# Patient Record
Sex: Female | Born: 1965 | Race: White | Hispanic: No | Marital: Married | Smoking: Never smoker
Health system: Southern US, Community
[De-identification: ages and names within clinical notes are randomized; demographics above are authoritative.]

## PROBLEM LIST (undated history)

## (undated) DIAGNOSIS — G9050 Complex regional pain syndrome I, unspecified: Secondary | ICD-10-CM

## (undated) DIAGNOSIS — M1990 Unspecified osteoarthritis, unspecified site: Secondary | ICD-10-CM

## (undated) DIAGNOSIS — R519 Headache, unspecified: Secondary | ICD-10-CM

## (undated) DIAGNOSIS — F419 Anxiety disorder, unspecified: Secondary | ICD-10-CM

## (undated) DIAGNOSIS — I2510 Atherosclerotic heart disease of native coronary artery without angina pectoris: Secondary | ICD-10-CM

## (undated) DIAGNOSIS — E119 Type 2 diabetes mellitus without complications: Secondary | ICD-10-CM

## (undated) DIAGNOSIS — G43909 Migraine, unspecified, not intractable, without status migrainosus: Secondary | ICD-10-CM

## (undated) DIAGNOSIS — G4730 Sleep apnea, unspecified: Secondary | ICD-10-CM

## (undated) DIAGNOSIS — I10 Essential (primary) hypertension: Secondary | ICD-10-CM

## (undated) DIAGNOSIS — M797 Fibromyalgia: Secondary | ICD-10-CM

## (undated) DIAGNOSIS — Z860100 Personal history of colon polyps, unspecified: Secondary | ICD-10-CM

## (undated) DIAGNOSIS — E66811 Obesity, class 1: Secondary | ICD-10-CM

## (undated) DIAGNOSIS — R51 Headache: Secondary | ICD-10-CM

## (undated) DIAGNOSIS — T7840XA Allergy, unspecified, initial encounter: Secondary | ICD-10-CM

## (undated) DIAGNOSIS — K900 Celiac disease: Secondary | ICD-10-CM

## (undated) DIAGNOSIS — K219 Gastro-esophageal reflux disease without esophagitis: Secondary | ICD-10-CM

## (undated) DIAGNOSIS — E669 Obesity, unspecified: Secondary | ICD-10-CM

## (undated) DIAGNOSIS — G459 Transient cerebral ischemic attack, unspecified: Secondary | ICD-10-CM

## (undated) DIAGNOSIS — E785 Hyperlipidemia, unspecified: Secondary | ICD-10-CM

## (undated) HISTORY — DX: Gastro-esophageal reflux disease without esophagitis: K21.9

## (undated) HISTORY — DX: Personal history of colonic polyps: Z86.010

## (undated) HISTORY — DX: Migraine, unspecified, not intractable, without status migrainosus: G43.909

## (undated) HISTORY — DX: Personal history of colon polyps, unspecified: Z86.0100

## (undated) HISTORY — PX: DIAGNOSTIC LAPAROSCOPY: SUR761

## (undated) HISTORY — DX: Sleep apnea, unspecified: G47.30

## (undated) HISTORY — PX: NASAL SEPTUM SURGERY: SHX37

## (undated) HISTORY — DX: Obesity, class 1: E66.811

## (undated) HISTORY — PX: BLADDER REPAIR: SHX76

## (undated) HISTORY — DX: Essential (primary) hypertension: I10

## (undated) HISTORY — PX: HERNIA REPAIR: SHX51

## (undated) HISTORY — DX: Atherosclerotic heart disease of native coronary artery without angina pectoris: I25.10

## (undated) HISTORY — DX: Celiac disease: K90.0

## (undated) HISTORY — PX: BREAST BIOPSY: SHX20

## (undated) HISTORY — PX: APPENDECTOMY: SHX54

## (undated) HISTORY — PX: BLADDER SUSPENSION: SHX72

## (undated) HISTORY — DX: Anxiety disorder, unspecified: F41.9

## (undated) HISTORY — DX: Hyperlipidemia, unspecified: E78.5

## (undated) HISTORY — DX: Obesity, unspecified: E66.9

## (undated) HISTORY — DX: Allergy, unspecified, initial encounter: T78.40XA

## (undated) HISTORY — DX: Type 2 diabetes mellitus without complications: E11.9

## (undated) HISTORY — PX: TONSILLECTOMY: SUR1361

## (undated) HISTORY — PX: VAGINAL HYSTERECTOMY: SUR661

---

## 2001-06-01 ENCOUNTER — Encounter: Payer: Self-pay | Admitting: Internal Medicine

## 2005-03-22 ENCOUNTER — Ambulatory Visit: Payer: Self-pay | Admitting: Internal Medicine

## 2005-04-01 ENCOUNTER — Encounter
Admission: RE | Admit: 2005-04-01 | Discharge: 2005-06-30 | Payer: Self-pay | Admitting: Physical Medicine & Rehabilitation

## 2005-04-02 ENCOUNTER — Ambulatory Visit: Payer: Self-pay | Admitting: Physical Medicine & Rehabilitation

## 2005-06-02 ENCOUNTER — Ambulatory Visit: Payer: Self-pay | Admitting: Physical Medicine & Rehabilitation

## 2005-07-02 ENCOUNTER — Encounter
Admission: RE | Admit: 2005-07-02 | Discharge: 2005-09-30 | Payer: Self-pay | Admitting: Physical Medicine & Rehabilitation

## 2005-07-28 ENCOUNTER — Ambulatory Visit: Payer: Self-pay | Admitting: Physical Medicine & Rehabilitation

## 2005-09-20 ENCOUNTER — Ambulatory Visit: Payer: Self-pay | Admitting: Physical Medicine & Rehabilitation

## 2005-10-20 ENCOUNTER — Encounter
Admission: RE | Admit: 2005-10-20 | Discharge: 2006-01-18 | Payer: Self-pay | Admitting: Physical Medicine & Rehabilitation

## 2005-12-01 ENCOUNTER — Ambulatory Visit: Payer: Self-pay | Admitting: Physical Medicine & Rehabilitation

## 2005-12-27 ENCOUNTER — Ambulatory Visit: Payer: Self-pay | Admitting: Physical Medicine & Rehabilitation

## 2006-01-26 ENCOUNTER — Ambulatory Visit: Payer: Self-pay | Admitting: Physical Medicine & Rehabilitation

## 2006-01-26 ENCOUNTER — Encounter
Admission: RE | Admit: 2006-01-26 | Discharge: 2006-04-26 | Payer: Self-pay | Admitting: Physical Medicine & Rehabilitation

## 2006-02-08 ENCOUNTER — Ambulatory Visit: Payer: Self-pay | Admitting: Internal Medicine

## 2006-03-03 ENCOUNTER — Ambulatory Visit: Payer: Self-pay | Admitting: Internal Medicine

## 2006-03-21 ENCOUNTER — Ambulatory Visit: Payer: Self-pay | Admitting: Physical Medicine & Rehabilitation

## 2006-03-24 ENCOUNTER — Encounter: Payer: Self-pay | Admitting: Obstetrics & Gynecology

## 2006-03-24 ENCOUNTER — Ambulatory Visit: Payer: Self-pay | Admitting: Obstetrics & Gynecology

## 2006-03-28 ENCOUNTER — Ambulatory Visit (HOSPITAL_COMMUNITY): Admission: RE | Admit: 2006-03-28 | Discharge: 2006-03-28 | Payer: Self-pay | Admitting: Gynecology

## 2006-05-02 ENCOUNTER — Ambulatory Visit: Payer: Self-pay | Admitting: Oncology

## 2006-05-17 ENCOUNTER — Ambulatory Visit: Payer: Self-pay | Admitting: Physical Medicine & Rehabilitation

## 2006-05-17 ENCOUNTER — Encounter
Admission: RE | Admit: 2006-05-17 | Discharge: 2006-08-15 | Payer: Self-pay | Admitting: Physical Medicine & Rehabilitation

## 2006-05-18 ENCOUNTER — Encounter
Admission: RE | Admit: 2006-05-18 | Discharge: 2006-08-16 | Payer: Self-pay | Admitting: Physical Medicine & Rehabilitation

## 2006-05-19 ENCOUNTER — Ambulatory Visit: Payer: Self-pay | Admitting: Physical Medicine & Rehabilitation

## 2006-06-09 ENCOUNTER — Ambulatory Visit: Payer: Self-pay | Admitting: Obstetrics & Gynecology

## 2006-06-28 ENCOUNTER — Ambulatory Visit (HOSPITAL_COMMUNITY): Admission: RE | Admit: 2006-06-28 | Discharge: 2006-06-30 | Payer: Self-pay | Admitting: Gynecology

## 2006-06-28 ENCOUNTER — Ambulatory Visit: Payer: Self-pay | Admitting: Obstetrics & Gynecology

## 2006-06-28 ENCOUNTER — Encounter (INDEPENDENT_AMBULATORY_CARE_PROVIDER_SITE_OTHER): Payer: Self-pay | Admitting: Specialist

## 2006-07-03 ENCOUNTER — Inpatient Hospital Stay (HOSPITAL_COMMUNITY): Admission: AD | Admit: 2006-07-03 | Discharge: 2006-07-03 | Payer: Self-pay | Admitting: Gynecology

## 2006-07-07 ENCOUNTER — Ambulatory Visit: Payer: Self-pay | Admitting: Obstetrics & Gynecology

## 2006-07-08 ENCOUNTER — Ambulatory Visit: Payer: Self-pay | Admitting: Gynecology

## 2006-07-11 ENCOUNTER — Ambulatory Visit: Payer: Self-pay | Admitting: Physical Medicine & Rehabilitation

## 2006-08-04 ENCOUNTER — Ambulatory Visit: Payer: Self-pay | Admitting: Family Medicine

## 2006-08-11 ENCOUNTER — Ambulatory Visit: Payer: Self-pay | Admitting: Obstetrics & Gynecology

## 2006-08-12 ENCOUNTER — Ambulatory Visit: Payer: Self-pay | Admitting: Physical Medicine & Rehabilitation

## 2006-09-05 ENCOUNTER — Ambulatory Visit: Payer: Self-pay | Admitting: Gynecology

## 2006-09-14 ENCOUNTER — Ambulatory Visit: Payer: Self-pay | Admitting: Physical Medicine & Rehabilitation

## 2006-09-14 ENCOUNTER — Encounter
Admission: RE | Admit: 2006-09-14 | Discharge: 2006-12-13 | Payer: Self-pay | Admitting: Physical Medicine & Rehabilitation

## 2006-11-17 ENCOUNTER — Ambulatory Visit: Payer: Self-pay | Admitting: Internal Medicine

## 2006-11-18 ENCOUNTER — Encounter
Admission: RE | Admit: 2006-11-18 | Discharge: 2007-02-16 | Payer: Self-pay | Admitting: Physical Medicine & Rehabilitation

## 2006-11-21 ENCOUNTER — Ambulatory Visit: Payer: Self-pay | Admitting: Physical Medicine & Rehabilitation

## 2006-11-28 ENCOUNTER — Encounter: Payer: Self-pay | Admitting: Internal Medicine

## 2006-11-28 ENCOUNTER — Ambulatory Visit: Payer: Self-pay

## 2006-12-19 ENCOUNTER — Encounter
Admission: RE | Admit: 2006-12-19 | Discharge: 2007-03-19 | Payer: Self-pay | Admitting: Physical Medicine and Rehabilitation

## 2006-12-19 ENCOUNTER — Ambulatory Visit: Payer: Self-pay | Admitting: Physical Medicine and Rehabilitation

## 2007-01-17 ENCOUNTER — Ambulatory Visit: Payer: Self-pay | Admitting: Physical Medicine & Rehabilitation

## 2007-02-10 ENCOUNTER — Encounter
Admission: RE | Admit: 2007-02-10 | Discharge: 2007-05-11 | Payer: Self-pay | Admitting: Physical Medicine & Rehabilitation

## 2007-02-17 ENCOUNTER — Telehealth (INDEPENDENT_AMBULATORY_CARE_PROVIDER_SITE_OTHER): Payer: Self-pay | Admitting: *Deleted

## 2007-03-14 ENCOUNTER — Ambulatory Visit: Payer: Self-pay | Admitting: Physical Medicine & Rehabilitation

## 2007-03-16 ENCOUNTER — Ambulatory Visit: Payer: Self-pay | Admitting: Obstetrics & Gynecology

## 2007-03-24 ENCOUNTER — Encounter: Payer: Self-pay | Admitting: Internal Medicine

## 2007-03-24 DIAGNOSIS — Z860100 Personal history of colon polyps, unspecified: Secondary | ICD-10-CM | POA: Insufficient documentation

## 2007-03-24 DIAGNOSIS — G4730 Sleep apnea, unspecified: Secondary | ICD-10-CM | POA: Insufficient documentation

## 2007-03-24 DIAGNOSIS — T7840XA Allergy, unspecified, initial encounter: Secondary | ICD-10-CM | POA: Insufficient documentation

## 2007-03-24 DIAGNOSIS — J4520 Mild intermittent asthma, uncomplicated: Secondary | ICD-10-CM | POA: Insufficient documentation

## 2007-03-24 DIAGNOSIS — F39 Unspecified mood [affective] disorder: Secondary | ICD-10-CM | POA: Insufficient documentation

## 2007-03-24 DIAGNOSIS — G90529 Complex regional pain syndrome I of unspecified lower limb: Secondary | ICD-10-CM | POA: Insufficient documentation

## 2007-03-24 DIAGNOSIS — K219 Gastro-esophageal reflux disease without esophagitis: Secondary | ICD-10-CM | POA: Insufficient documentation

## 2007-03-24 DIAGNOSIS — G43009 Migraine without aura, not intractable, without status migrainosus: Secondary | ICD-10-CM | POA: Insufficient documentation

## 2007-03-28 ENCOUNTER — Ambulatory Visit: Payer: Self-pay | Admitting: Internal Medicine

## 2007-03-28 DIAGNOSIS — R079 Chest pain, unspecified: Secondary | ICD-10-CM | POA: Insufficient documentation

## 2007-03-29 ENCOUNTER — Ambulatory Visit: Payer: Self-pay | Admitting: Obstetrics & Gynecology

## 2007-04-09 ENCOUNTER — Encounter: Payer: Self-pay | Admitting: Internal Medicine

## 2007-04-18 ENCOUNTER — Ambulatory Visit: Payer: Self-pay | Admitting: Internal Medicine

## 2007-05-11 ENCOUNTER — Ambulatory Visit: Payer: Self-pay | Admitting: Physical Medicine & Rehabilitation

## 2007-06-01 ENCOUNTER — Ambulatory Visit: Payer: Self-pay | Admitting: Obstetrics & Gynecology

## 2007-06-08 ENCOUNTER — Encounter
Admission: RE | Admit: 2007-06-08 | Discharge: 2007-07-18 | Payer: Self-pay | Admitting: Physical Medicine & Rehabilitation

## 2007-06-26 ENCOUNTER — Encounter: Payer: Self-pay | Admitting: Internal Medicine

## 2007-06-30 ENCOUNTER — Ambulatory Visit: Payer: Self-pay | Admitting: Internal Medicine

## 2007-06-30 DIAGNOSIS — J301 Allergic rhinitis due to pollen: Secondary | ICD-10-CM | POA: Insufficient documentation

## 2007-06-30 DIAGNOSIS — I10 Essential (primary) hypertension: Secondary | ICD-10-CM | POA: Insufficient documentation

## 2007-06-30 DIAGNOSIS — J309 Allergic rhinitis, unspecified: Secondary | ICD-10-CM | POA: Insufficient documentation

## 2007-07-03 ENCOUNTER — Encounter: Payer: Self-pay | Admitting: Internal Medicine

## 2007-07-03 LAB — CONVERTED CEMR LAB
Albumin: 3.9 g/dL (ref 3.5–5.2)
CO2: 26 meq/L (ref 19–32)
Calcium: 9.1 mg/dL (ref 8.4–10.5)
Creatinine,U: 127 mg/dL
GFR calc non Af Amer: 83 mL/min
Glucose, Bld: 92 mg/dL (ref 70–99)
Microalb, Ur: 1.1 mg/dL (ref 0.0–1.9)
Phosphorus: 2 mg/dL — ABNORMAL LOW (ref 2.3–4.6)
Potassium: 4 meq/L (ref 3.5–5.1)
Sodium: 144 meq/L (ref 135–145)

## 2007-07-04 ENCOUNTER — Encounter: Payer: Self-pay | Admitting: Obstetrics & Gynecology

## 2007-07-04 ENCOUNTER — Ambulatory Visit (HOSPITAL_COMMUNITY): Admission: RE | Admit: 2007-07-04 | Discharge: 2007-07-04 | Payer: Self-pay | Admitting: Obstetrics & Gynecology

## 2007-07-04 ENCOUNTER — Ambulatory Visit: Payer: Self-pay | Admitting: Obstetrics & Gynecology

## 2007-07-19 ENCOUNTER — Encounter
Admission: RE | Admit: 2007-07-19 | Discharge: 2007-10-17 | Payer: Self-pay | Admitting: Physical Medicine & Rehabilitation

## 2007-07-20 ENCOUNTER — Ambulatory Visit: Payer: Self-pay | Admitting: Physical Medicine & Rehabilitation

## 2007-07-27 ENCOUNTER — Ambulatory Visit: Payer: Self-pay | Admitting: Obstetrics & Gynecology

## 2007-09-04 ENCOUNTER — Encounter: Payer: Self-pay | Admitting: Internal Medicine

## 2007-09-06 ENCOUNTER — Encounter: Payer: Self-pay | Admitting: Internal Medicine

## 2007-09-20 ENCOUNTER — Ambulatory Visit: Payer: Self-pay | Admitting: Physical Medicine & Rehabilitation

## 2007-10-03 ENCOUNTER — Encounter: Payer: Self-pay | Admitting: Internal Medicine

## 2007-10-10 ENCOUNTER — Encounter: Payer: Self-pay | Admitting: Internal Medicine

## 2007-10-19 HISTORY — PX: OOPHORECTOMY: SHX86

## 2007-10-23 ENCOUNTER — Encounter: Payer: Self-pay | Admitting: Internal Medicine

## 2007-10-23 ENCOUNTER — Ambulatory Visit: Payer: Self-pay | Admitting: Physical Medicine & Rehabilitation

## 2007-10-23 ENCOUNTER — Telehealth (INDEPENDENT_AMBULATORY_CARE_PROVIDER_SITE_OTHER): Payer: Self-pay | Admitting: *Deleted

## 2007-10-23 ENCOUNTER — Encounter
Admission: RE | Admit: 2007-10-23 | Discharge: 2008-01-21 | Payer: Self-pay | Admitting: Physical Medicine & Rehabilitation

## 2007-11-27 ENCOUNTER — Ambulatory Visit: Payer: Self-pay | Admitting: Physical Medicine & Rehabilitation

## 2007-11-29 ENCOUNTER — Ambulatory Visit: Payer: Self-pay | Admitting: Family Medicine

## 2007-12-04 ENCOUNTER — Encounter: Payer: Self-pay | Admitting: Internal Medicine

## 2007-12-04 ENCOUNTER — Observation Stay (HOSPITAL_COMMUNITY): Admission: EM | Admit: 2007-12-04 | Discharge: 2007-12-07 | Payer: Self-pay | Admitting: Emergency Medicine

## 2007-12-04 ENCOUNTER — Ambulatory Visit: Payer: Self-pay | Admitting: Cardiovascular Disease

## 2007-12-05 ENCOUNTER — Encounter: Payer: Self-pay | Admitting: Internal Medicine

## 2007-12-05 ENCOUNTER — Ambulatory Visit: Payer: Self-pay | Admitting: Vascular Surgery

## 2007-12-06 ENCOUNTER — Encounter: Payer: Self-pay | Admitting: Internal Medicine

## 2007-12-07 ENCOUNTER — Encounter: Payer: Self-pay | Admitting: Internal Medicine

## 2007-12-07 ENCOUNTER — Ambulatory Visit: Payer: Self-pay | Admitting: Internal Medicine

## 2007-12-17 DIAGNOSIS — I6789 Other cerebrovascular disease: Secondary | ICD-10-CM | POA: Insufficient documentation

## 2007-12-21 ENCOUNTER — Ambulatory Visit: Payer: Self-pay | Admitting: Internal Medicine

## 2007-12-21 DIAGNOSIS — E785 Hyperlipidemia, unspecified: Secondary | ICD-10-CM | POA: Insufficient documentation

## 2007-12-21 DIAGNOSIS — G43809 Other migraine, not intractable, without status migrainosus: Secondary | ICD-10-CM | POA: Insufficient documentation

## 2007-12-26 LAB — CONVERTED CEMR LAB
Alkaline Phosphatase: 78 units/L (ref 39–117)
Bilirubin, Direct: 0.1 mg/dL (ref 0.0–0.3)
Calcium: 9.3 mg/dL (ref 8.4–10.5)
Chloride: 112 meq/L (ref 96–112)
Cholesterol: 192 mg/dL (ref 0–200)
Creatinine, Ser: 0.8 mg/dL (ref 0.4–1.2)
GFR calc non Af Amer: 83 mL/min
HDL: 37.9 mg/dL — ABNORMAL LOW (ref >39.0)
Total CHOL/HDL Ratio: 5.1
VLDL: 51 mg/dL — ABNORMAL HIGH (ref 0–40)

## 2008-01-18 ENCOUNTER — Encounter
Admission: RE | Admit: 2008-01-18 | Discharge: 2008-01-22 | Payer: Self-pay | Admitting: Physical Medicine & Rehabilitation

## 2008-01-22 ENCOUNTER — Ambulatory Visit: Payer: Self-pay | Admitting: Physical Medicine & Rehabilitation

## 2008-01-23 ENCOUNTER — Ambulatory Visit: Payer: Self-pay | Admitting: Internal Medicine

## 2008-01-25 LAB — CONVERTED CEMR LAB
ALT: 18 units/L (ref 0–35)
AST: 17 units/L (ref 0–37)
BUN: 8 mg/dL (ref 6–23)
Calcium: 9.1 mg/dL (ref 8.4–10.5)
Chloride: 112 meq/L (ref 96–112)
Creatinine, Ser: 0.8 mg/dL (ref 0.4–1.2)
Direct LDL: 83 mg/dL
GFR calc Af Amer: 101 mL/min
Glucose, Bld: 98 mg/dL (ref 70–99)
HDL: 33.5 mg/dL — ABNORMAL LOW (ref >39.0)
Phosphorus: 2.9 mg/dL (ref 2.3–4.6)
Sodium: 143 meq/L (ref 135–145)

## 2008-02-06 ENCOUNTER — Telehealth (INDEPENDENT_AMBULATORY_CARE_PROVIDER_SITE_OTHER): Payer: Self-pay | Admitting: *Deleted

## 2008-02-16 ENCOUNTER — Encounter
Admission: RE | Admit: 2008-02-16 | Discharge: 2008-05-16 | Payer: Self-pay | Admitting: Physical Medicine & Rehabilitation

## 2008-02-20 ENCOUNTER — Ambulatory Visit: Payer: Self-pay | Admitting: Physical Medicine & Rehabilitation

## 2008-02-22 ENCOUNTER — Encounter: Payer: Self-pay | Admitting: Internal Medicine

## 2008-02-28 ENCOUNTER — Ambulatory Visit: Payer: Self-pay | Admitting: Internal Medicine

## 2008-02-29 LAB — CONVERTED CEMR LAB
ALT: 19 units/L (ref 0–35)
Basophils Relative: 1 % (ref 0.0–1.0)
CO2: 28 meq/L (ref 19–32)
Calcium: 9.2 mg/dL (ref 8.4–10.5)
Eosinophils Absolute: 0.5 10*3/uL (ref 0.0–0.7)
Eosinophils Relative: 6.2 % — ABNORMAL HIGH (ref 0.0–5.0)
GFR calc non Af Amer: 83 mL/min
Glucose, Bld: 102 mg/dL — ABNORMAL HIGH (ref 70–99)
HCT: 39.5 % (ref 36.0–46.0)
LDL Cholesterol: 113 mg/dL — ABNORMAL HIGH (ref 0–99)
MCHC: 32.9 g/dL (ref 30.0–36.0)
Monocytes Absolute: 0.4 10*3/uL (ref 0.1–1.0)
Neutrophils Relative %: 63.8 % (ref 43.0–77.0)
Potassium: 4.1 meq/L (ref 3.5–5.1)
RDW: 13 % (ref 11.5–14.6)
Total Bilirubin: 0.5 mg/dL (ref 0.3–1.2)
Total CHOL/HDL Ratio: 5.3
VLDL: 37 mg/dL (ref 0–40)
WBC: 8.2 10*3/uL (ref 4.5–10.5)

## 2008-03-12 ENCOUNTER — Telehealth (INDEPENDENT_AMBULATORY_CARE_PROVIDER_SITE_OTHER): Payer: Self-pay | Admitting: *Deleted

## 2008-03-19 ENCOUNTER — Ambulatory Visit: Payer: Self-pay | Admitting: Physical Medicine & Rehabilitation

## 2008-04-16 ENCOUNTER — Ambulatory Visit: Payer: Self-pay | Admitting: Physical Medicine & Rehabilitation

## 2008-04-17 ENCOUNTER — Ambulatory Visit: Payer: Self-pay | Admitting: Family Medicine

## 2008-04-25 ENCOUNTER — Ambulatory Visit: Payer: Self-pay | Admitting: Obstetrics & Gynecology

## 2008-04-25 ENCOUNTER — Encounter: Payer: Self-pay | Admitting: Obstetrics & Gynecology

## 2008-04-28 ENCOUNTER — Emergency Department (HOSPITAL_COMMUNITY): Admission: EM | Admit: 2008-04-28 | Discharge: 2008-04-28 | Payer: Self-pay | Admitting: Family Medicine

## 2008-04-28 ENCOUNTER — Telehealth: Payer: Self-pay | Admitting: Internal Medicine

## 2008-05-14 ENCOUNTER — Ambulatory Visit: Payer: Self-pay | Admitting: Physical Medicine & Rehabilitation

## 2008-06-12 ENCOUNTER — Encounter
Admission: RE | Admit: 2008-06-12 | Discharge: 2008-09-10 | Payer: Self-pay | Admitting: Physical Medicine & Rehabilitation

## 2008-06-13 ENCOUNTER — Ambulatory Visit: Payer: Self-pay | Admitting: Physical Medicine & Rehabilitation

## 2008-07-09 ENCOUNTER — Ambulatory Visit: Payer: Self-pay | Admitting: Physical Medicine & Rehabilitation

## 2008-07-16 ENCOUNTER — Ambulatory Visit (HOSPITAL_COMMUNITY): Admission: RE | Admit: 2008-07-16 | Discharge: 2008-07-16 | Payer: Self-pay | Admitting: Gynecology

## 2008-07-16 ENCOUNTER — Ambulatory Visit: Payer: Self-pay | Admitting: Obstetrics & Gynecology

## 2008-07-26 ENCOUNTER — Ambulatory Visit: Payer: Self-pay | Admitting: Family Medicine

## 2008-07-26 DIAGNOSIS — N390 Urinary tract infection, site not specified: Secondary | ICD-10-CM | POA: Insufficient documentation

## 2008-07-26 LAB — CONVERTED CEMR LAB
Bilirubin Urine: NEGATIVE
Glucose, Urine, Semiquant: NEGATIVE
Ketones, urine, test strip: NEGATIVE
Nitrite: NEGATIVE
Protein, U semiquant: >=300
Urobilinogen, UA: 0.2

## 2008-07-27 ENCOUNTER — Encounter: Payer: Self-pay | Admitting: Family Medicine

## 2008-08-12 ENCOUNTER — Ambulatory Visit: Payer: Self-pay | Admitting: Obstetrics & Gynecology

## 2008-08-13 ENCOUNTER — Ambulatory Visit: Payer: Self-pay | Admitting: Physical Medicine & Rehabilitation

## 2008-08-30 ENCOUNTER — Encounter (INDEPENDENT_AMBULATORY_CARE_PROVIDER_SITE_OTHER): Payer: Self-pay | Admitting: *Deleted

## 2008-09-09 ENCOUNTER — Encounter
Admission: RE | Admit: 2008-09-09 | Discharge: 2008-12-08 | Payer: Self-pay | Admitting: Physical Medicine & Rehabilitation

## 2008-09-10 ENCOUNTER — Ambulatory Visit: Payer: Self-pay | Admitting: Physical Medicine & Rehabilitation

## 2008-10-08 ENCOUNTER — Ambulatory Visit: Payer: Self-pay | Admitting: Physical Medicine & Rehabilitation

## 2008-10-16 ENCOUNTER — Encounter: Payer: Self-pay | Admitting: Internal Medicine

## 2008-11-07 ENCOUNTER — Ambulatory Visit: Payer: Self-pay | Admitting: Physical Medicine & Rehabilitation

## 2008-12-06 ENCOUNTER — Ambulatory Visit: Payer: Self-pay | Admitting: Physical Medicine & Rehabilitation

## 2008-12-27 ENCOUNTER — Encounter: Payer: Self-pay | Admitting: Obstetrics & Gynecology

## 2008-12-27 ENCOUNTER — Telehealth: Payer: Self-pay | Admitting: Family Medicine

## 2009-01-07 ENCOUNTER — Telehealth: Payer: Self-pay | Admitting: Internal Medicine

## 2009-01-07 ENCOUNTER — Encounter: Payer: Self-pay | Admitting: Internal Medicine

## 2009-01-28 ENCOUNTER — Telehealth: Payer: Self-pay | Admitting: Internal Medicine

## 2009-01-30 ENCOUNTER — Encounter
Admission: RE | Admit: 2009-01-30 | Discharge: 2009-02-03 | Payer: Self-pay | Admitting: Physical Medicine & Rehabilitation

## 2009-02-03 ENCOUNTER — Ambulatory Visit: Payer: Self-pay | Admitting: Physical Medicine & Rehabilitation

## 2009-02-07 ENCOUNTER — Encounter: Payer: Self-pay | Admitting: Obstetrics & Gynecology

## 2009-02-07 LAB — CONVERTED CEMR LAB: Vit D, 25-Hydroxy: 33 ng/mL (ref 30–89)

## 2009-02-28 ENCOUNTER — Ambulatory Visit: Payer: Self-pay | Admitting: Internal Medicine

## 2009-04-25 ENCOUNTER — Encounter
Admission: RE | Admit: 2009-04-25 | Discharge: 2009-04-29 | Payer: Self-pay | Admitting: Physical Medicine & Rehabilitation

## 2009-04-29 ENCOUNTER — Ambulatory Visit: Payer: Self-pay | Admitting: Physical Medicine & Rehabilitation

## 2009-05-07 ENCOUNTER — Ambulatory Visit: Payer: Self-pay | Admitting: Family Medicine

## 2009-05-07 LAB — CONVERTED CEMR LAB
Bilirubin Urine: NEGATIVE
Ketones, urine, test strip: NEGATIVE
Nitrite: NEGATIVE
Urobilinogen, UA: 0.2
WBC Urine, dipstick: NEGATIVE
WBC, UA: 0 cells/hpf
pH: 5

## 2009-07-03 ENCOUNTER — Telehealth: Payer: Self-pay | Admitting: Internal Medicine

## 2009-07-04 ENCOUNTER — Encounter: Admission: RE | Admit: 2009-07-04 | Discharge: 2009-07-04 | Payer: Self-pay | Admitting: Orthopedic Surgery

## 2009-07-21 ENCOUNTER — Encounter
Admission: RE | Admit: 2009-07-21 | Discharge: 2009-10-14 | Payer: Self-pay | Admitting: Physical Medicine & Rehabilitation

## 2009-08-05 ENCOUNTER — Ambulatory Visit: Payer: Self-pay | Admitting: Physical Medicine & Rehabilitation

## 2009-10-08 ENCOUNTER — Ambulatory Visit: Payer: Self-pay | Admitting: Internal Medicine

## 2009-10-08 DIAGNOSIS — J019 Acute sinusitis, unspecified: Secondary | ICD-10-CM | POA: Insufficient documentation

## 2009-11-10 ENCOUNTER — Encounter
Admission: RE | Admit: 2009-11-10 | Discharge: 2010-02-08 | Payer: Self-pay | Admitting: Physical Medicine & Rehabilitation

## 2009-12-12 ENCOUNTER — Ambulatory Visit: Payer: Self-pay | Admitting: Physical Medicine & Rehabilitation

## 2010-03-20 ENCOUNTER — Encounter
Admission: RE | Admit: 2010-03-20 | Discharge: 2010-06-18 | Payer: Self-pay | Admitting: Physical Medicine & Rehabilitation

## 2010-04-06 ENCOUNTER — Ambulatory Visit: Payer: Self-pay | Admitting: Physical Medicine & Rehabilitation

## 2010-04-16 ENCOUNTER — Telehealth (INDEPENDENT_AMBULATORY_CARE_PROVIDER_SITE_OTHER): Payer: Self-pay | Admitting: *Deleted

## 2010-06-23 ENCOUNTER — Telehealth: Payer: Self-pay | Admitting: Internal Medicine

## 2010-06-28 ENCOUNTER — Emergency Department (HOSPITAL_COMMUNITY): Admission: EM | Admit: 2010-06-28 | Discharge: 2010-06-28 | Payer: Self-pay | Admitting: Emergency Medicine

## 2010-06-30 ENCOUNTER — Encounter
Admission: RE | Admit: 2010-06-30 | Discharge: 2010-07-03 | Payer: Self-pay | Source: Home / Self Care | Attending: Physical Medicine & Rehabilitation | Admitting: Physical Medicine & Rehabilitation

## 2010-07-03 ENCOUNTER — Ambulatory Visit: Payer: Self-pay | Admitting: Physical Medicine & Rehabilitation

## 2010-07-20 ENCOUNTER — Ambulatory Visit: Payer: Self-pay | Admitting: Internal Medicine

## 2010-07-21 LAB — CONVERTED CEMR LAB
Basophils Absolute: 0.1 10*3/uL (ref 0.0–0.1)
Calcium: 9.1 mg/dL (ref 8.4–10.5)
Chloride: 107 meq/L (ref 96–112)
Cholesterol: 195 mg/dL (ref 0–200)
Eosinophils Absolute: 0.5 10*3/uL (ref 0.0–0.7)
Eosinophils Relative: 4.9 % (ref 0.0–5.0)
Glucose, Bld: 93 mg/dL (ref 70–99)
HCT: 39.9 % (ref 36.0–46.0)
HDL: 36.3 mg/dL — ABNORMAL LOW (ref >39.00)
Hemoglobin: 13.5 g/dL (ref 12.0–15.0)
Lymphocytes Relative: 16.5 % (ref 12.0–46.0)
Neutrophils Relative %: 71.1 % (ref 43.0–77.0)
Potassium: 4.6 meq/L (ref 3.5–5.1)
RDW: 14.6 % (ref 11.5–14.6)
Sodium: 140 meq/L (ref 135–145)
TSH: 1.47 microintl units/mL (ref 0.35–5.50)
Triglycerides: 561 mg/dL — ABNORMAL HIGH (ref 0.0–149.0)
WBC: 9.6 10*3/uL (ref 4.5–10.5)

## 2010-09-23 ENCOUNTER — Encounter
Admission: RE | Admit: 2010-09-23 | Discharge: 2010-11-04 | Payer: Self-pay | Source: Home / Self Care | Attending: Physical Medicine & Rehabilitation | Admitting: Physical Medicine & Rehabilitation

## 2010-09-29 ENCOUNTER — Ambulatory Visit: Payer: Self-pay | Admitting: Physical Medicine & Rehabilitation

## 2010-10-28 ENCOUNTER — Encounter
Admission: RE | Admit: 2010-10-28 | Discharge: 2010-11-17 | Payer: Self-pay | Source: Home / Self Care | Attending: Physical Medicine & Rehabilitation | Admitting: Physical Medicine & Rehabilitation

## 2010-11-09 ENCOUNTER — Ambulatory Visit
Admission: RE | Admit: 2010-11-09 | Discharge: 2010-11-09 | Payer: Self-pay | Source: Home / Self Care | Attending: Physical Medicine & Rehabilitation | Admitting: Physical Medicine & Rehabilitation

## 2010-11-09 ENCOUNTER — Encounter: Payer: Self-pay | Admitting: Obstetrics & Gynecology

## 2010-11-12 ENCOUNTER — Telehealth: Payer: Self-pay | Admitting: Internal Medicine

## 2010-11-15 LAB — CONVERTED CEMR LAB
ALT: 14 units/L (ref 0–40)
Albumin: 3.8 g/dL (ref 3.5–5.2)
BUN: 11 mg/dL (ref 6–23)
BUN: 8 mg/dL (ref 6–23)
Basophils Absolute: 0.1 10*3/uL (ref 0.0–0.1)
CO2: 28 meq/L (ref 19–32)
Calcium: 9 mg/dL (ref 8.4–10.5)
Chloride: 114 meq/L — ABNORMAL HIGH (ref 96–112)
Cholesterol: 162 mg/dL (ref 0–200)
Creatinine, Ser: 0.8 mg/dL (ref 0.4–1.2)
Creatinine, Ser: 0.9 mg/dL (ref 0.4–1.2)
Eosinophils Absolute: 0.4 10*3/uL (ref 0.0–0.6)
GFR calc non Af Amer: 83 mL/min
HCT: 37.2 % (ref 36.0–46.0)
Hemoglobin: 13.2 g/dL (ref 12.0–15.0)
LDL Cholesterol: 92 mg/dL (ref 0–99)
Lymphs Abs: 2.2 10*3/uL (ref 0.7–4.0)
MCHC: 34 g/dL (ref 30.0–36.0)
MCHC: 34.5 g/dL (ref 30.0–36.0)
MCV: 81.9 fL (ref 78.0–100.0)
MCV: 89.6 fL (ref 78.0–100.0)
Monocytes Absolute: 0.4 10*3/uL (ref 0.2–0.7)
Monocytes Relative: 5.1 % (ref 3.0–11.0)
Neutrophils Relative %: 68.3 % (ref 43.0–77.0)
Platelets: 214 10*3/uL (ref 150.0–400.0)
Platelets: 287 10*3/uL (ref 150–400)
RBC: 4.28 M/uL (ref 3.87–5.11)
RBC: 4.54 M/uL (ref 3.87–5.11)
Sodium: 145 meq/L (ref 135–145)
TSH: 0.92 microintl units/mL (ref 0.35–5.50)
TSH: 1.64 microintl units/mL (ref 0.35–5.50)
Total Bilirubin: 0.7 mg/dL (ref 0.3–1.2)
Total CHOL/HDL Ratio: 5
Triglycerides: 184 mg/dL — ABNORMAL HIGH (ref 0.0–149.0)
VLDL: 36.8 mg/dL (ref 0.0–40.0)
WBC: 8.2 10*3/uL (ref 4.5–10.5)
WBC: 8.4 10*3/uL (ref 4.5–10.5)

## 2010-11-17 NOTE — Progress Notes (Signed)
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 They don't want her to come there, but they tell her that she needs to get a note for this. Patient stated that she kept her friend's sick child and she was dx with this. She states that she does not want to pay a co-payment to come in and just get a note. Patient states that she was trying to reschedule her appt. with her probation officer because she does not want to expose any one to this since she has been exposed. It was suggested to her that she get a note from the child's doctor and she said that the note has to come from her PCP.  Please advise.   Patient is aware that Dr. Alphonsus Sias is out of the office this afternoon.  Initial call taken by: Sydell Axon LPN,  June 23, 2010 3:37 PM  Follow-up for Phone Call        I cannot write a note related to an unconfirmed exposure to Fifth disease, which most adults have already had and is generally benign Follow-up by: Cindee Salt MD,  June 24, 2010 8:53 AM  Additional Follow-up for Phone Call Additional follow up Details #1::        left message on machine at home for patient to return my call.  DeShannon Smith CMA Duncan Dull)  June 24, 2010 9:27 AM   Spoke with patient and advised results.   Additional Follow-up by: Mervin Hack CMA Duncan Dull),  June 24, 2010 4:03 PM

## 2010-11-17 NOTE — Assessment & Plan Note (Signed)
Summary: CPX... CYD   Vital Signs:  Patient profile:   45 year old female Temp:     98.2 degrees F oral Pulse rate:   72 / minute Pulse rhythm:   regular BP sitting:   122 / 80  (right arm) Cuff size:   large  Vitals Entered By: Mervin Hack CMA Duncan Dull) (July 20, 2010 11:52 AM) CC: adult physical   History of Present Illness: Here for PT  Sprained left ankle and tore some ligaments Seeing [facility] for this In walking shoe (injured trying to jump [place] wall in [employer]) Using ibuprofen for this Notes she lost all of her tramadol in sink yesterday  Awoke with cough 2 days ago ?slight fever yesterday SOme SOB--"extrremely" yellow sputum some yellow nasal discharge  Still has pain managed by Dr Wynn Banker Still sees Dr Elsie Lincoln for gyn care  Allergies: 1)  ! Demerol (Meperidine Hcl) 2)  ! Valium 3)  ! Tylox  Past History:  Past medical, surgical, family and social histories (including risk factors) reviewed for relevance to current acute and chronic problems.  Past Medical History: Reviewed history from 01/23/2008 and no changes required. Anxiety Asthma Colonic polyps, hx of GERD Reflex sympathetic dystrophy Migraines Sleep apnea--mild Hypertension Allergic rhinitis Hyperlipidemia ASCVD  CONSULTANTS Dr Wynn Banker    [phone] Dr Meryl Crutch  [phone] Dr Juanetta Snow   [phone]  Past Surgical History: Reviewed history from 02/28/2008 and no changes required. 1996    3 weeks - pain program [facility])  C-section x 1 Tonsillectomy  Nasal Septal Repair Hysterectomy/Bladder lift/appendix  2007 Right ovary removed 2008 Laparoscopy x 2 2/08      Holter - benign 2/09  Migraine variant with neuro symptoms.  MRI with mild chronic ischemia, not acute. MRA normal  Family History: Father: Alive 49 - diverticulosis, colon cancer Mother: Alive 5 - lung cancer Siblings: 2 brothers, alive, 3 sisters alive CV:  Pat. side HTN in Mom and  sister Breast CA:  MGM Uterine CA:  PGM Stomach and lung CA  Social History: Reviewed history from 02/28/2008 and no changes required. Marital Status: Married Children:  twins, boy and girl 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 Never Smoked Alcohol use-no Drug use-no Regular exercise-yes, walks some  Review of Systems General:  Complains of sleep disorder; had been gaining weight--trying to increase walking till she injured ankle chronic sleep problems wears seat belt. Eyes:  Denies double vision and vision loss-1 eye. ENT:  Denies decreased hearing and ringing in ears; teeth pain with sinus infection regular with dentist. CV:  Complains of palpitations and shortness of breath with exertion; denies chest pain or discomfort, difficulty breathing at night, difficulty breathing while lying down, fainting, and lightheadness; Palpitations with anxiety stable DOE on stairs. Resp:  Complains of cough, shortness of breath, sputum productive, and wheezing. GI:  Complains of change in bowel habits and indigestion; denies abdominal pain, bloody stools, dark tarry stools, nausea, and vomiting; reflux controlled with meds bowels are some better since she decreased her cymbalta by mistake. GU:  Complains of decreased libido and urinary frequency; denies dysuria and incontinence; more urgency of urine . MS:  Complains of joint pain, muscle aches, and stiffness; ongoing chronic pain less headaches and dizziness on decreased cymbalta. Derm:  Denies lesion(s) and rash; sporadic breakdown on legs from RSD. Neuro:  Complains of headaches, numbness, tingling, and weakness; dizziness better on lower dose of cymbalta not taking requip--- not enough money for Rx. Psych:  Complains of  anxiety and depression. Heme:  Denies abnormal bruising and enlarge lymph nodes. Allergy:  Complains of seasonal allergies and sneezing; meds help.  Physical Exam  General:  alert.  NAD Coarse cough Head:  moderate maxillary  tenderness and mild frontal tenderness Eyes:  pupils equal, pupils round, pupils reactive to light, and no optic disk abnormalities.   Ears:  R ear normal and L ear normal.   Nose:  moderate inflammation and swelling Mouth:  no erythema, no exudates, and no lesions.   Neck:  supple, no masses, no thyromegaly, no carotid bruits, and no cervical lymphadenopathy.   Lungs:  normal respiratory effort, no intercostal retractions, no accessory muscle use, normal breath sounds, no crackles, and no wheezes.   Heart:  normal rate, regular rhythm, no murmur, and no gallop.   Abdomen:  soft, non-tender, and no masses.   Msk:  no joint swelling and no joint warmth.   Pulses:  1+ in feet Extremities:  no edema Neurologic:  alert & oriented X3.   In walking boot but no focal weakness Skin:  no rashes and no suspicious lesions.   Axillary Nodes:  No palpable lymphadenopathy Psych:  normally interactive, good eye contact, not anxious appearing, and not depressed appearing.     Impression & Recommendations:  Problem # 1:  PREVENTIVE HEALTH CARE (ICD-V70.0) Assessment Comment Only mammos through gyn had colon dscussed health lifestyle  Problem # 2:  SINUSITIS - ACUTE-NOS (ICD-461.9) Assessment: New clear infection again discussed possibility of initial viral infection but she always worsens without Rx will try the amoxil again  Problem # 3:  HYPERTENSION (ICD-401.9) Assessment: Unchanged  good control no changes needed  Her updated medication list for this problem includes:    Metoprolol Tartrate 25 Mg Tabs (Metoprolol tartrate) .Marland Kitchen... 1/2 tab twice a day  BP today: 122/80 Prior BP: 150/100 (10/08/2009)  Labs Reviewed: K+: 4.2 (02/28/2009) Creat: : 0.9 (02/28/2009)   Chol: 162 (02/28/2009)   HDL: 33.50 (02/28/2009)   LDL: 92 (02/28/2009)   TG: 184.0 (02/28/2009)  Orders: TLB-Renal Function Panel (80069-RENAL) TLB-CBC Platelet - w/Differential (85025-CBCD) TLB-TSH (Thyroid Stimulating  Hormone) (84443-TSH) Venipuncture (93235)  Problem # 4:  HYPERLIPIDEMIA (ICD-272.4) Assessment: Unchanged  no problems with this due for labs  Her updated medication list for this problem includes:    Simvastatin 20 Mg Tabs (Simvastatin) .Marland Kitchen... Take 1 tablet by mouth once a day  Labs Reviewed: SGOT: 21 (02/28/2009)   SGPT: 18 (02/28/2009)   HDL:33.50 (02/28/2009), 34.7 (02/28/2008)  LDL:92 (02/28/2009), 113 (57/32/2025)  Chol:162 (02/28/2009), 185 (02/28/2008)  Trig:184.0 (02/28/2009), 186 (02/28/2008)  Orders: TLB-Lipid Panel (80061-LIPID) TLB-Hepatic/Liver Function Pnl (80076-HEPATIC)  Problem # 5:  REFLEX SYMPATHETIC DYSTROPHY (ICD-337.20) Assessment: Unchanged continues with Dr Wynn Banker  Problem # 6:  ASTHMA (ICD-493.90) Assessment: Unchanged will conitinue meds no exacerbation currently  Her updated medication list for this problem includes:    Singulair 10 Mg Tabs (Montelukast sodium) .Marland Kitchen... 1 by mouth daily    Proair Hfa 108 (90 Base) Mcg/act Aers (Albuterol sulfate) .Marland Kitchen... 2 puffs inhaled up to four times daily for asthma as needed  Complete Medication List: 1)  Tizanidine Hcl 4 Mg Tabs (Tizanidine hcl) .Marland Kitchen.. 1-2 four times daily as needed 2)  Nexium 40 Mg Cpdr (Esomeprazole magnesium) .Marland Kitchen.. 1 by mouth two times a day 3)  Singulair 10 Mg Tabs (Montelukast sodium) .Marland Kitchen.. 1 by mouth daily 4)  Cymbalta 30 Mg Cpep (Duloxetine hcl) .Marland Kitchen.. 1 by mouth twice a day 5)  Claritin 10 Mg Tabs (  Loratadine) .Marland Kitchen.. 1 by mouth daily as needed 6)  Proair Hfa 108 (90 Base) Mcg/act Aers (Albuterol sulfate) .... 2 puffs inhaled up to four times daily for asthma as needed 7)  Simvastatin 20 Mg Tabs (Simvastatin) .... Take 1 tablet by mouth once a day 8)  Metoprolol Tartrate 25 Mg Tabs (Metoprolol tartrate) .... 1/2 tab twice a day 9)  Requip 0.25 Mg Tabs (Ropinirole hcl) .Marland Kitchen.. 1-2 at bedtime for restless legs 10)  Topamax 200 Mg Tabs (Topiramate) .... 2 tablets at bedtime by mouth for a total of  400mg  11)  Amoxicillin 500 Mg Tabs (Amoxicillin) .... 2 tabs by mouth two times a day for sinus infection 12)  Hydrocodone-homatropine 5-1.5 Mg/69ml Syrp (Hydrocodone-homatropine) .Marland Kitchen.. 1-2 teaspoons at night as needed for severe cough  Patient Instructions: 1)  Please schedule a follow-up appointment in 6 months .  Prescriptions: SINGULAIR 10 MG TABS (MONTELUKAST SODIUM) 1 by mouth daily  #30 x 12   Entered and Authorized by:   Cindee Salt MD   Signed by:   Cindee Salt MD on 07/20/2010   Method used:   [facility]        [facility] [id number]       [address]       [place], [place]  [address]       Ph: [phone] or [phone]       Fax: [phone]   RxID:   [id number] HYDROCODONE-HOMATROPINE 5-1.5 MG/5ML SYRP (HYDROCODONE-HOMATROPINE) 1-2 teaspoons at night as needed for severe cough  #4oz x 0   Entered and Authorized by:   Cindee Salt MD   Signed by:   Cindee Salt MD on 07/20/2010   Method used:   Print then Give to Patient   RxID:   [id number] AMOXICILLIN 500 MG TABS (AMOXICILLIN) 2 tabs by mouth two times a day for sinus infection  #40 x 0   Entered and Authorized by:   Cindee Salt MD   Signed by:   Cindee Salt MD on 07/20/2010   Method used:   [facility]        [facility] [id number]       [address]       [place], [place]  [address]       Ph: [phone] or [phone]       Fax: [phone]   RxID:   [id number] METOPROLOL TARTRATE 25 MG  TABS (METOPROLOL TARTRATE) 1/2 tab twice a day  #90 x 3   Entered by:   Mervin Hack CMA (AAMA)   Authorized by:   Cindee Salt MD   Signed by:   Mervin Hack CMA (AAMA) on 07/20/2010   Method used:   [facility]        [facility] [facility]retail[facility]       [address]       [place], [place]  [address]       Ph: [phone] or [phone]       Fax: [phone]   RxID:    [id number] SIMVASTATIN 20 MG  TABS (SIMVASTATIN) Take 1 tablet by mouth once a day  #90 x 3   Entered by:   Mervin Hack CMA (AAMA)   Authorized by:   Cindee Salt MD   Signed by:   Mervin Hack CMA (AAMA) on 07/20/2010   Method used:   [facility]        [facility] [id number]       [address]       [place], [place]  [address]       Ph: [phone] or [phone]       Fax: [phone]   RxID:   [id number] SINGULAIR 10 MG TABS (MONTELUKAST SODIUM) 1 by mouth daily  #90 x 3   Entered by:   Mervin Hack CMA (AAMA)   Authorized by:   Cindee Salt MD   Signed by:   Mervin Hack CMA (AAMA) on 07/20/2010   Method used:   [facility]        [facility]  [address] [facility]       [address]       [place], [place]  [address]       Ph: [phone] or [phone]       Fax: [phone]   RxID:   [id number]

## 2010-11-17 NOTE — Progress Notes (Signed)
Summary: refill request for nexium  Phone Note Refill Request Message from:  Fax from Pharmacy  Refills Requested: Medication #1:  NEXIUM 40 MG  CPDR 1 by mouth two times a day Faxed form from [facility] is on your desk.  Initial call taken by: Lowella Petties CMA,  April 16, 2010 11:37 AM Caller: [facility]  Follow-up for Phone Call        This is the same person, Nunzio Cory is her maiden name.               Lowella Petties CMA  April 16, 2010 2:16 PM  Okay x 1 year  Follow-up by: Cindee Salt MD,  April 16, 2010 2:36 PM  Additional Follow-up for Phone Call Additional follow up Details #1::        completed form faxed to [phone] as instructed. Lewanda Rife LPN  April 16, 2010 3:15 PM

## 2010-11-19 NOTE — Progress Notes (Signed)
Summary: order for colonoscopy  Phone Note Call from Patient Call back at Home Phone [phone]   Caller: Patient Call For: Cindee Salt MD Summary of Call: Patient is asking for order to have colonoscopy sent to Dr. Noe Gens in [place].  Initial call taken by: Melody Comas,  November 12, 2010 1:58 PM  Follow-up for Phone Call        okay to make referral Follow-up by: Cindee Salt MD,  November 12, 2010 2:50 PM

## 2010-12-01 ENCOUNTER — Encounter: Payer: Self-pay | Admitting: Internal Medicine

## 2010-12-01 LAB — HM COLONOSCOPY

## 2010-12-04 ENCOUNTER — Telehealth: Payer: Self-pay | Admitting: Family Medicine

## 2010-12-04 ENCOUNTER — Ambulatory Visit (INDEPENDENT_AMBULATORY_CARE_PROVIDER_SITE_OTHER): Payer: Managed Care, Other (non HMO) | Admitting: Family Medicine

## 2010-12-04 ENCOUNTER — Encounter: Payer: Self-pay | Admitting: Family Medicine

## 2010-12-04 DIAGNOSIS — J019 Acute sinusitis, unspecified: Secondary | ICD-10-CM

## 2010-12-08 ENCOUNTER — Ambulatory Visit: Payer: Self-pay | Admitting: Physical Medicine & Rehabilitation

## 2010-12-08 ENCOUNTER — Encounter: Payer: Worker's Compensation | Attending: Physical Medicine & Rehabilitation

## 2010-12-09 NOTE — Progress Notes (Signed)
Summary: Question regarding visit  Phone Note Call from Patient   Summary of Call: Pt was in the office earlier today... Pt need to know what otc meds she was told to take, she losted her intructions sheet.Daine Gip  December 04, 2010 3:54 PM  Initial call taken by: Daine Gip,  December 04, 2010 3:56 PM  Follow-up for Phone Call        may read instr to her, offer to mail copy. Follow-up by: Eustaquio Boyden  MD,  December 04, 2010 4:13 PM  Additional Follow-up for Phone Call Additional follow up Details #1::        Patient notified and verbalized understanding. Additional Follow-up by: Janee Morn CMA Duncan Dull),  December 04, 2010 4:47 PM

## 2010-12-09 NOTE — Assessment & Plan Note (Signed)
Summary: CONGESTION,ST,EAR/CLE   [employer]   Vital Signs:  Patient profile:   45 year old female Weight:      215.50 pounds BMI:     33.62 Temp:     98.1 degrees F oral Pulse rate:   84 / minute Pulse rhythm:   regular BP sitting:   138 / 90  (right arm) Cuff size:   large  Vitals Entered By: Selena Batten Dance CMA (AAMA) (December 04, 2010 10:20 AM) CC: Sore throat,congestion,ear pain x3 weeks   History of Present Illness: CC: sick 3 wks  3wk h/o intermittent illness.  Started with sinus congestion, coughing productive yellow/green sputum.  + purulent discharge as well.  + L ear throbbing and top teeth hurting.  + neck pain  No fevers/chills currently, abd pain, n/v.  No arthralgias.    had extra amox, took about 3 days and started feeling better but again feeling poorly.  Also tried corsidin and some cough medicine.  Has had sinus infections in past, this feels similar.  Husband smokes outside.  Pt with h/o asthma.  Current Medications (verified): 1)  Tizanidine Hcl 4 Mg Tabs (Tizanidine Hcl) .Marland Kitchen.. 1-2 Four Times Daily As Needed 2)  Nexium 40 Mg  Cpdr (Esomeprazole Magnesium) .Marland Kitchen.. 1 By Mouth Two Times A Day 3)  Singulair 10 Mg Tabs (Montelukast Sodium) .Marland Kitchen.. 1 By Mouth Daily 4)  Cymbalta 30 Mg Cpep (Duloxetine Hcl) .Marland Kitchen.. 1 By Mouth Twice A Day 5)  Claritin 10 Mg Tabs (Loratadine) .Marland Kitchen.. 1 By Mouth Daily As Needed 6)  Proair Hfa 108 (90 Base) Mcg/act  Aers (Albuterol Sulfate) .... 2 Puffs Inhaled Up To Four Times Daily For Asthma As Needed 7)  Simvastatin 20 Mg  Tabs (Simvastatin) .... Take 1 Tablet By Mouth Once A Day 8)  Metoprolol Tartrate 25 Mg  Tabs (Metoprolol Tartrate) .... 1/2 Tab Twice A Day 9)  Requip 0.25 Mg  Tabs (Ropinirole Hcl) .Marland Kitchen.. 1-2 At Bedtime For Restless Legs 10)  Topamax 200 Mg  Tabs (Topiramate) .... 2 Tablets At Bedtime By Mouth For A Total of 400mg   Allergies: 1)  ! Demerol (Meperidine Hcl) 2)  ! Valium 3)  ! Tylox  Past History:  Past Medical History: Last  updated: 01/23/2008 Anxiety Asthma Colonic polyps, hx of GERD Reflex sympathetic dystrophy Migraines Sleep apnea--mild Hypertension Allergic rhinitis Hyperlipidemia ASCVD  CONSULTANTS Dr Wynn Banker    [phone] Dr Meryl Crutch  [phone] Dr Juanetta Snow   [phone]  Social History: Last updated: 02/28/2008 Marital Status: Married Children:  twins, boy and girl 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 Never Smoked Alcohol use-no Drug use-no Regular exercise-yes, walks some  Review of Systems       per HPI  Physical Exam  General:  Well-developed,well-nourished,in no acute distress; alert,appropriate and cooperative throughout examination Head:  significant maxillary sinus tenderness Eyes:  PERRLA, EOMI Ears:  L ear with effusion, R ear clear Nose:  moderate inflammation and swelling/discharge Mouth:  no erythema, no exudates, and no lesions.   Neck:  supple, no masses, no thyromegaly, no carotid bruits, and no cervical lymphadenopathy.   FROM Lungs:  normal respiratory effort, no intercostal retractions, no accessory muscle use, normal breath sounds, no crackles, and no wheezes.   Heart:  normal rate, regular rhythm, no murmur, and no gallop.   Pulses:  2+ rad pulses bilaterally, brisk cap refill Extremities:  no edema Skin:  no rashes and no suspicious lesions.     Impression & Recommendations:  Problem # 1:  SINUSITIS -  ACUTE-NOS (ICD-461.9) sinus infectino going on > 2 wks.  treat with amoxicillin x 10 days, supportive care as per instructions.  red flags to return discussed.  The following medications were removed from the medication list:    Amoxicillin 500 Mg Tabs (Amoxicillin) .Marland Kitchen... 2 tabs by mouth two times a day for sinus infection    Hydrocodone-homatropine 5-1.5 Mg/71ml Syrp (Hydrocodone-homatropine) .Marland Kitchen... 1-2 teaspoons at night as needed for severe cough Her updated medication list for this problem includes:    Amoxicillin 875 Mg Tabs (Amoxicillin) ..... One by mouth  two times a day x 10 days    Tussionex Pennkinetic Er 10-8 Mg/44ml Lqcr (Hydrocod polst-chlorphen polst) ..... One teaspoon at bedtime as needed cough  Complete Medication List: 1)  Tizanidine Hcl 4 Mg Tabs (Tizanidine hcl) .Marland Kitchen.. 1-2 four times daily as needed 2)  Nexium 40 Mg Cpdr (Esomeprazole magnesium) .Marland Kitchen.. 1 by mouth two times a day 3)  Singulair 10 Mg Tabs (Montelukast sodium) .Marland Kitchen.. 1 by mouth daily 4)  Cymbalta 30 Mg Cpep (Duloxetine hcl) .Marland Kitchen.. 1 by mouth twice a day 5)  Claritin 10 Mg Tabs (Loratadine) .Marland Kitchen.. 1 by mouth daily as needed 6)  Proair Hfa 108 (90 Base) Mcg/act Aers (Albuterol sulfate) .... 2 puffs inhaled up to four times daily for asthma as needed 7)  Simvastatin 20 Mg Tabs (Simvastatin) .... Take 1 tablet by mouth once a day 8)  Metoprolol Tartrate 25 Mg Tabs (Metoprolol tartrate) .... 1/2 tab twice a day 9)  Requip 0.25 Mg Tabs (Ropinirole hcl) .Marland Kitchen.. 1-2 at bedtime for restless legs 10)  Topamax 200 Mg Tabs (Topiramate) .... 2 tablets at bedtime by mouth for a total of 400mg  11)  Amoxicillin 875 Mg Tabs (Amoxicillin) .... One by mouth two times a day x 10 days 12)  Tussionex Pennkinetic Er 10-8 Mg/61ml Lqcr (Hydrocod polst-chlorphen polst) .... One teaspoon at bedtime as needed cough  Patient Instructions: 1)  You have a sinus infection. 2)  Take medicines as prescribed: amoxicillin twice daily for 10 days, tussionex at night for cough. 3)  Take guaifenesin 400mg  IR 1 1/2 pills in am and at noon (or simple mucinex) with plenty of fluid to help mobilize mucous. 4)  Use nasal saline spray or neti pot to help drainage of sinuses. 5)  If you start having fevers >101.5, trouble swallowing or breathing, or are worsening instead of improving as expected, you may need to be seen again. 6)  Good to see you today, call clinic with questions.  Prescriptions: Sandria Senter ER 10-8 MG/5ML LQCR (HYDROCOD POLST-CHLORPHEN POLST) one teaspoon at bedtime as needed cough  #100cc x 0    Entered and Authorized by:   Eustaquio Boyden  MD   Signed by:   Eustaquio Boyden  MD on 12/04/2010   Method used:   Print then Give to Patient   RxID:   [id number] AMOXICILLIN 875 MG TABS (AMOXICILLIN) one by mouth two times a day x 10 days  #20 x 0   Entered and Authorized by:   Eustaquio Boyden  MD   Signed by:   Eustaquio Boyden  MD on 12/04/2010   Method used:   [facility]        [facility] (retail)       [address]       [place], [place]  [address]       Ph: [phone] or [phone]       Fax: [phone]   RxID:  [id number]    Orders Added: 1)  Est. Patient Level III [14782]    Current Allergies (reviewed today): ! DEMEROL (MEPERIDINE HCL) ! VALIUM ! TYLOX

## 2010-12-10 ENCOUNTER — Telehealth: Payer: Self-pay | Admitting: Internal Medicine

## 2010-12-15 NOTE — Progress Notes (Signed)
Summary: NEXIUM  Phone Note Refill Request Message from:  [facility] on December 10, 2010 3:53 PM  Refills Requested: Medication #1:  NEXIUM 40 MG  CPDR 1 by mouth two times a day Form on your desk    Method Requested: Fax to Mail Away Pharmacy Initial call taken by: Mervin Hack CMA Duncan Dull),  December 10, 2010 3:54 PM  Follow-up for Phone Call        okay x 1 year Follow-up by: Cindee Salt MD,  December 10, 2010 5:22 PM  Additional Follow-up for Phone Call Additional follow up Details #1::        Rx faxed to pharmacy, [facility] [phone] ext [phone], fax# [phone] Additional Follow-up by: Mervin Hack CMA Duncan Dull),  December 11, 2010 8:41 AM    Prescriptions: NEXIUM 40 MG  CPDR (ESOMEPRAZOLE MAGNESIUM) 1 by mouth two times a day  #90 x 3   Entered by:   Mervin Hack CMA (AAMA)   Authorized by:   Cindee Salt MD   Signed by:   Mervin Hack CMA (AAMA) on 12/11/2010   Method used:   Handwritten   RxID:   [id number]

## 2010-12-29 ENCOUNTER — Ambulatory Visit: Payer: Worker's Compensation | Admitting: Physical Medicine & Rehabilitation

## 2010-12-29 ENCOUNTER — Encounter: Payer: Worker's Compensation | Attending: Physical Medicine & Rehabilitation

## 2010-12-29 DIAGNOSIS — M79609 Pain in unspecified limb: Secondary | ICD-10-CM | POA: Insufficient documentation

## 2010-12-29 DIAGNOSIS — M7989 Other specified soft tissue disorders: Secondary | ICD-10-CM | POA: Insufficient documentation

## 2010-12-29 DIAGNOSIS — G90519 Complex regional pain syndrome I of unspecified upper limb: Secondary | ICD-10-CM

## 2011-01-08 NOTE — Assessment & Plan Note (Signed)
REASON FOR VISIT:  Left upper extremity pain.  HISTORY:  Work-related wrist injury in 1990s while working in [place], resulting in RSD.  She has had some spillover into lower extremities. She had an ankle sprain, non-work-related injury, treated by Orthopedics/Sports Medicine.  She had decline in her exercise tolerance since that time.  She did body exercise bike that I advised and has been using this intermittently.  She had a prednisone burst and taper, however, she has some weight gain along with this.  This was helpful for her.  We did bump up the hydrocodone to 7.5/325 at night.  This has been more helpful for her.  MEDICATIONS: 1. Ibuprofen 800 t.i.d., this is to be resumed now that she is off the     prednisone. 2. Hydrocodone 7.5 one p.o. q.p.m. 3. Cymbalta 90 mg a day. 4. Topamax 100 mg in the morning, 200 at night. 5. Tizanidine 4 mg 1/2 p.o. b.i.d. 1 p.o. at bedtime. 6. Lidoderm patch 3 patches on 12-hours span. 7. Tramadol 50 mg daily. 8. Ultram ER 100 mg daily.  PHYSICAL EXAMINATION:  GENERAL:  Well-developed, well-nourished female, in no acute distress. MUSCULOSKELETAL:  Swelling dorsum of the wrist.  She has sweating of the left hand greater than right hand.  She has decreased grip in the left upper extremity, 4/5 strength overall due to pain inhibition.  Lower extremity 4 at the ankle, but otherwise 5 proximally, right side 5 throughout.  IMPRESSION:  Reflex sympathetic dystrophy, i.e. complex regional pain syndrome, type 1, primarily affecting the left upper extremity.  PLAN:  Continue current medications as listed above.  I will see her back in 3 months.  Advised a continued exercise bike, this should be less bothersome to her than walking.     [name], M.D. Electronically Signed    AEK/Med[facility] D:  12/29/2010 11:57:48  T:  12/29/2010 21:21:05  Job #:  [id number]

## 2011-01-20 ENCOUNTER — Ambulatory Visit (INDEPENDENT_AMBULATORY_CARE_PROVIDER_SITE_OTHER): Payer: Self-pay | Admitting: Internal Medicine

## 2011-01-20 ENCOUNTER — Encounter: Payer: Self-pay | Admitting: Internal Medicine

## 2011-01-20 VITALS — BP 148/80 | HR 81 | Temp 98.4°F | Ht 67.0 in | Wt 212.0 lb

## 2011-01-20 DIAGNOSIS — E785 Hyperlipidemia, unspecified: Secondary | ICD-10-CM

## 2011-01-20 DIAGNOSIS — F411 Generalized anxiety disorder: Secondary | ICD-10-CM

## 2011-01-20 DIAGNOSIS — J45909 Unspecified asthma, uncomplicated: Secondary | ICD-10-CM

## 2011-01-20 DIAGNOSIS — G9050 Complex regional pain syndrome I, unspecified: Secondary | ICD-10-CM

## 2011-01-20 DIAGNOSIS — I10 Essential (primary) hypertension: Secondary | ICD-10-CM

## 2011-01-20 NOTE — Progress Notes (Signed)
  Subjective:    Patient ID: [name], female    DOB: 1966-07-23, 45 y.o.   MRN: [id number]  HPI About the same Still with ongoing pain problems Considering ganglion blocks---Dr Kirsteins still directs that care  Has had some anxiety attacks Some palpitations Still frustrated with foot problems---had to buy bike because she couldn't exercise with weight bearing on foot  Asthma has acted up occ Rescue inhaler ~2 times per week lately. Related to tree pollen though  No sig stomach trouble Good control on the med  Past Medical History  Diagnosis Date  . Anxiety   . Asthma   . History of colonic polyps   . GERD (gastroesophageal reflux disease)   . Reflex sympathetic dystrophy   . Migraines   . Sleep apnea   . Hypertension   . Allergy   . Hyperlipidemia   . ASCVD (arteriosclerotic cardiovascular disease)     Past Surgical History  Procedure Date  . Cesarean section   . Tonsillectomy   . Nasal septal repair   . Abdominal hysterectomy   . Appendectomy   . Bladder lift 10/18/2005  . Oophorectomy 10/18/2006    right    Family History  Problem Relation Age of Onset  . Cancer Mother     lung  . Hypertension Mother   . Cancer Father     colon  . Hypertension Sister   . Birth defects Maternal Grandmother     breast  . Birth defects Paternal Grandmother     uterine, stomach, lung    History   Social History  . Marital Status: Married    Spouse Name: N/A    Number of Children: 2  . Years of Education: N/A   Occupational History  . Disabled due to RSD    Social History Main Topics  . Smoking status: Never Smoker   . Smokeless tobacco: Not on file  . Alcohol Use: No  . Drug Use:   . Sexually Active:    Other Topics Concern  . Not on file   Social History Narrative  . No narrative on file    Review of Systems Still with chronic sleep problems Appetite is fine--trying to be careful Has lost 2# since last visit Has gotten over the sinus  infection--still some barky cough she relates to asthma     Objective:   Physical Exam  Constitutional: She appears well-developed and well-nourished. No distress.  Neck: Normal range of motion. No thyromegaly present.  Cardiovascular: Normal rate, regular rhythm and normal heart sounds.  Exam reveals no gallop.   No murmur heard. Pulmonary/Chest: Effort normal and breath sounds normal. No respiratory distress. She has no wheezes. She has no rales.  Abdominal: Soft. She exhibits no mass. There is no tenderness.  Musculoskeletal: Normal range of motion. She exhibits no edema and no tenderness.  Lymphadenopathy:    She has no cervical adenopathy.  Psychiatric: She has a normal mood and affect. Her behavior is normal. Judgment and thought content normal.          Assessment & Plan:

## 2011-03-02 NOTE — Assessment & Plan Note (Signed)
NAME:  [name], [name] NO.:  [id number]   MEDICAL RECORD NO.:  [id number]          PATIENT TYPE:  POB   LOCATION:  [facility]:  [name], MD      DATE OF BIRTH:  [birth year]   DATE OF SERVICE:  04/25/2008                                  CLINIC NOTE   The patient is a 45 year old female who presents for her yearly exam.  The patient has absolutely no GYN problems.  She has undergone LAVH and  sling and then a subsequent laparoscopic removal of an ovary, all with  normal pathology.   PAST MEDICAL HISTORY:  1. Asthma.  2. RSD.  3. Migraines.  4. Chronic constipation.  5. Irritable bowel syndrome.  6. The patient also had a recent TIA/stroke and was admitted to the      ICU for 5 days in February.   PAST SURGICAL HISTORY:  No surgery since last year.  LAVH and then an  RSO.  She also had another laparoscopy to rule out endometriosis,  deviated septum repair, and tonsillectomy.   GYNECOLOGICAL HISTORY:  No changes.  NSVD of twins.  History of benign  ovarian cyst.  No endometriosis.  No fibroids.  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   ALLERGIES:  1. VALIUM.  2. DEMEROL.   MEDICATIONS:  The patient does not bring in her new medication list  after her stroke, but she will bring that by, and we will add that to  her medical record form.   SOCIAL HISTORY:  No smoking, drinking, or drugs.  She is married.   REVIEW OF SYSTEMS:  Fourteen points negative.   PHYSICAL EXAMINATION:  VITAL SIGNS:  Pulse 71, blood pressure 135/89,  weight 187, and height 5 feet 7 inches.  GENERAL:  Well nourished and well developed, no apparent distress.  HEENT:  Normocephalic and atraumatic.  Good dentition.  NECK:  Thyroid no masses.  LUNGS:  Clear to auscultation bilaterally.  HEART:  Regular rate and rhythm.  BREASTS:  No masses, no skin changes, and no nipple discharge or  lymphadenopathy.  ABDOMEN:  Soft and nontender.  No  organomegaly.  No hernia.  GENITALIA:  Tanner V.  Vagina pink, normal rugae, with intact vaginal  cuff.  Bimanual no masses, nontender.  RECTOVAGINAL:  No masses.  No rectocele.  EXTREMITIES:  Nontender.  No edema.   ASSESSMENT AND PLAN:  A 45 year old female with normal gynecologic  examination.  1. Pap smear.  2. Mammogram ordered.  3. Return to clinic in a year.           ______________________________  [name], MD     KL/MEDQ  D:  04/25/2008  T:  04/26/2008  Job:  [id number]

## 2011-03-02 NOTE — Assessment & Plan Note (Signed)
[name] returns today for treatment of reflex sympathetic dystrophy  left upper extremity.  She has been on a slow taper off narcotic  analgesic medications per her wishes.  She went down to 7.5/325 of  hydrocodone in February of 2009.  She has had some intercurrent problems  since I last saw her which are mostly in the social emotional realm.  Two of her dogs died.   In terms of her history of headaches, she missed an appointment with the  [facility] due to [name] absence.  She does see her  primary physician tomorrow.   CURRENT MEDS:  1. Hydrocodone 7.5/325 one p.o. daily.  2. Topamax 50 mg q.a.m. and 200 nightly.  3. Cymbalta 90 mg per day.  4. Lidoderm patch to the left wrist, on q.p.m., off q.a.m.   Current pain level is 7/10, sleep is poor.  Pain interferes with  activity at a moderate level.  Pain improves with rest, exercise, TENS.  She no longer has a TENS, the original unit, which was over 15 years  old, quit working.  Her pain does interfere with household duties and  shopping.  Her review of systems positive for numbness, tremor,  tingling, spasms.   EXAMINATION:  Blood pressure 116/76, pulse 68, respiratory rate is 18,  O2 sat 99% on room air.  GENERAL:  In no acute distress.  Mood and affect appropriate orientation  x3, affect is bright, alert, gait is normal.  Left upper extremity has 4/5 strength, she does have some pain  inhibition interfering with muscle testing, right side is 5/5 in the  deltoid, biceps, triceps, grip.  She has hypersensitivity to touch in  the hand and fingers.  Her gait is normal.  Her lower extremity strength  is 4/5 on the left, once again seems to not give full effort with pain  inhibition, right lower is normal strength.   IMPRESSION:  Reflex sympathetic dystrophy left upper extremity, some  spill-over in left lower in terms of symptomatology.   PLAN:  1. Continue hydrocodone 7.5/325 nightly.  Next month, plan is to  go      down to 5.  2. Continue Topamax 50 q.a.m. and 200 nightly.  3. Continue Cymbalta 90 mg a day.  4. Continue Lidoderm patch to left wrist, on q.p.m., off q.a.m.  5. Will order a TENS unit.  This should facilitate her coming off the      hydrocodone as well.      [name], M.D.  Electronically Signed     AEK/MedQ  D:  01/22/2008 09:44:06  T:  01/22/2008 10:13:58  Job #:  [id number]   cc:   [name]  Fax:  [phone]   Suzanne Carbon, MD  [place], [place] [address]   [name], Dr.  Darrol [name], [place]

## 2011-03-02 NOTE — Assessment & Plan Note (Signed)
[name] returns today for treatment of reflex sympathetic dystrophy,  left upper extremity.  She has had a history of some spillover effect to  the left lower extremity in terms of symptomatology.  She has been  tapering off narcotic analgesic medications, as is per her wishes, and  she has been tolerating this quite well.  She is down to 7.5 mg of  hydrocodone nightly.  She also has a history of headaches and follows at  [facility].   Current pain level is 5-6/10 compared to 7 last visit.  Pain interferes  with activities at a moderate level and fluctuates from day to day.  Pain is worse with walking, bending, sitting, inactivity.  She did have  an episode of squatting increasing lower extremity pain bilaterally of  numbness but this subsided after about 20 minutes.  She needs some  assistance with household duties and shopping.   REVIEW OF SYSTEMS:  Positive for numbness, tremor, tingling, spasm.   Her blood pressure is 115/70, pulse 71, respiratory rate is 18, O2  saturation 97% on room air.  GENERAL:  In no acute distress.  Mood and affect appropriate.  Orientation x3.  Affect is alert.  Gait is normal.  Left upper extremity:  4/5 strength due to pain  inhibition and inhibiting muscle testing.  The right side is 5/5 in the  deltoid, biceps, triceps, grip.  She has hypersensitivity to  touch in  the hand and fingers.  She has altered sensation over the entire left  side and this has been checked out in the past by neurology even with  hospitalization.  No evidence of stroke or other intracranial  abnormality.   IMPRESSION:  Reflex sympathetic dystrophy, left upper extremity,  spillover effect into left lower extremity.   PLAN:  1. Continue hydrocodone but reduce to 5/325 mg nightly.  2. At the same time we will increase her Topamax to 100 mg q.a.m. and      200 mg nightly.  3. Continue Cymbalta 90 mg per day.  4. Continue Lidoderm patch, left wrist, on  q.p.m., off q.a.m.  5. Consider TENS.  6. Continue Zanaflex one-half tablet b.i.d. and one tablet nightly at      4 mg strength.  7. I will see her back in 1 month, at which time we will taper off her      hydrocodone and decide whether or not to give her another      medication such as tramadol to take its place.  We will continue      ibuprofen 800 mg t.i.d. as well.      [name], M.D.  Electronically Signed     AEK/MedQ  D:  02/20/2008 12:38:10  T:  02/20/2008 13:20:09  Job #:  [id number]   cc:   Comer Locket, MD

## 2011-03-02 NOTE — Assessment & Plan Note (Signed)
The patient returns today with left upper extremity sympathetic  dystrophy due to work injury in 1990s, has been on chronic narcotic  analgesics with the goal of coming off.  We had been able to taper her  over time.   Motor strength 5/5 bilateral upper extremities.  Upper extremity  strength showed no evidence of vasomotor changes.  She does have  hypersensitivity to touch in left hand.  She has good pulses in  bilateral hands.  No nail dystrophic changes.   Pain scores 6-7/10 range.   REVIEW OF SYSTEMS:  Numbness, tremor, tingling, spasms, dizziness,  anxiety, constipation.   MEDICATIONS:  1. We will continue on hydrocodone 5/500 at bedtime.  We have also      started on tramadol extended release 100 mg per day.  2. Continue Cymbalta 90 mg per day.  3. Continue Zanaflex two p.o. t.i.d.  4. Topamax 100 mg per day.   Consider next visit to reduce Cymbalta 60 mg.  We may need to do this  sooner if she has any swelling or any other signs of serotonin syndrome.      [name], M.D.  Electronically Signed     AEK/MedQ  D:  12/06/2008 16:11:31  T:  12/07/2008 04:50:18  Job #:  [id number]

## 2011-03-02 NOTE — Op Note (Signed)
NAME:  [name], [name] NO.:  [id number]   MEDICAL RECORD NO.:  [id number]          PATIENT TYPE:  AMB   LOCATION:  [facility]                           FACILITY:  [facility]   PHYSICIAN:  Guss Bunde, M.D. DATE OF BIRTH:  01-May-1966   DATE OF PROCEDURE:  07/04/2007  DATE OF DISCHARGE:                               OPERATIVE REPORT   PREOPERATIVE DIAGNOSIS:  The patient is a 45 year old female with  chronic right lower quadrant pain.   POSTOPERATIVE DIAGNOSIS:  The patient is a 45 year old female with  chronic right lower quadrant pain.   PROCEDURE:  Diagnostic laparoscopy, right salpingo-oophorectomy.   SURGEON:  [name], MD.   ASSISTANTClovia Cuff, MD.   ANESTHESIA:  General.   FINDINGS:  Grossly normal pelvis with 1 adhesion between the left ovary  and the bladder. Right ovary free of adhesions. No major abdominal wall  bowel adhesions. Appendix surgically absent. Uterus surgically absent.   SPECIMENS:  Right fallopian tube and ovary to pathology.   ESTIMATED BLOOD LOSS:  Minimal.   COMPLICATIONS:  None.   PROCEDURE IN DETAIL:  After informed consent was obtained, the patient  was taken to the operating room where general anesthesia was induced.  The patient was placed in dorsal supine position and a Foley was placed  in the bladder. The patient's  abdomen was prepared, draped in normal  sterile fashion.   An umbilical skin incision was made with a scalpel and carried down to  the fascia bluntly. The fascia was picked up and incised sharply. Blunt  dissection was used to enter the peritoneal cavity. The Hasson trocar  was placed. The fascia was tagged and a pneumoperitoneum was achieved  with CO2. The patient was placed in Trendelenburg.   Under direct visualization a left lower quadrant port was introduced. It  was a 5-mm trocar. A blunt probe was entered into the abdomen and the  right ovary was easily viewed. The left ovary was also viewed and,  again, there was a bladder adhesion to the left ovary. The patient is  not having pain on the left-hand side. We decided to leave this ovary  alone. The right ovary although free, we decided to go ahead and remove  it in hopes to alleviate the patient's  pain.   A right lower quadrant port was then placed under direct visualization.  Using the Tripolar the right ovary and fallopian tube were cauterized  and removed. The camera was changed to a 5-mm lens and the Endopouch was  used to remove the ovary from the abdomen. One last look at the pelvis  revealed good hemostasis. The pneumoperitoneum was released.   The fascia at the midline incision was closed with 0 Vicryl and the skin  was closed with 4-0 Vicryl in subcuticular fashion. The 2 lower-quadrant  ports were closed with Dermabond. The Foley was removed from the  bladder. The patient tolerated the procedure well.   Sponge, lap, instrument and needle counts were correct x2 and the  patient went to the recovery room in stable condition.      [name]  Miguel Aschoff, M.D.  Electronically Signed     KHL/MEDQ  D:  07/04/2007  T:  07/04/2007  Job:  [id number]
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
## 2011-03-02 NOTE — Assessment & Plan Note (Signed)
[name] is a 45 year old female who has a history of wrist injury,  work-related, in the 1990s, and had resulted in left upper extremity  reflex sympathetic dystrophy.  When I first saw her several years ago,  she was on fairly high-dose morphine.  We have been able to wean her  down to just hydrocodone 5/500 at bedtime and gave her some tramadol  extended release 100 mg per day as well as Cymbalta at 90 mg a day,  Zanaflex at 2 p.o. t.i.d., and Topamax 100 mg in the morning and 200 at  night.   She had been doing relatively well up until about a couple of months ago  when she was playing basketball with her son, she developed left  shoulder pain.  At that point it was unclear how much of this was RSD  aggravation versus a new injury.  She reports some neck pain, spasms in  the upper back as well as shoulder pain.   She has had no new issues since I last saw her in April 2010.  She  continues to have the shoulder pain which limits her activity.   Her pain in her left upper extremity is 7-8/10.  This is her regular RSD  pain which is described as sharp, burning, stabbing, tingling, aching.   Her review of systems as noted in health and history form, positive for  numbness, tremor, tingling, spasms, dizziness, depression, anxiety,  constipation, night sweats.   Past medical history significant for hysterectomy.   PHYSICAL EXAMINATION:  VITAL SIGNS:  Blood pressure is 134/89, pulse 86,  respirations 18, O2 sat 97% on room air.  GENERAL:  Orientation x3.  Affect, bright and alert.  Gait is normal.  EXTREMITIES:  The left shoulder has positive impingement sign, positive  Hawkins maneuver.  She has some hypersensitivity to touch the left hand  but not in the shoulder.  She has some tenderness to palpation in the  upper trapezius of both sides but worse on the left side than the right  side.  Her gait is normal.  She has no signs of muscle atrophy in the  upper extremity.   IMPRESSION:  1. Reflex sympathetic dystrophy, chronic upper extremity pain.  She      has been relatively well managed on the current medications.  Her      main problem is sleep at night, for that reason we will put her on      some short-acting tramadol 50 mg at bedtime in addition to the      hydrocodone.  She will continue to take the Ultram ER 100 mg in the      morning.  2. In regards to her shoulder pain, I believe this may be a new issue.      I would like to have Orthopedic Surgery check her out.  We will      send her to Dr. Alphonzo Severance and see whether he feels that there is      any sign of any type of impingement syndrome.  This would be a non-      work-related injury, therefore covered under other insurance.      If he feels like it is more related to her previous RSD, I would be      happy to continue treating her as best we can.      [name], M.D.  Electronically Signed     AEK/MedQ  D:  04/29/2009 16:00:00  T:  04/30/2009 08:10:16  Job #:  [id number]   cc:   G. Alphonzo Severance, M.D.  Fax: [phone]

## 2011-03-02 NOTE — Discharge Summary (Signed)
[name], [name] NO.:  [id number]   MEDICAL RECORD NO.:  [id number]          PATIENT TYPE:  OBV   LOCATION:  3030                         FACILITY:  [facility]   PHYSICIAN:  Darrick Penna. Swords, MD    DATE OF BIRTH:  1966/06/23   DATE OF ADMISSION:  12/04/2007  DATE OF DISCHARGE:  12/07/2007                               DISCHARGE SUMMARY   DISCHARGE DIAGNOSES:  1. Left-sided weakness/numbness with headaches, most likely migraine      variant.  2. Dyslipidemia.  3. Tachycardia.  4. Hypertension.  5. Acute asthma exacerbation.   HISTORY OF PRESENT ILLNESS:  [name] is a 45 year old white  female who was admitted on December 04, 2007 with chief complaint of  left-sided numbness.  Her past medical history is significant for asthma  and migraines and possibly underlying hypertension.  She apparently  developed sudden onset of left-sided burning as well as numbness which  started on the left side of her face and also radiated down her neck and  arm and torso.  She felt some associated numbness with this.  It lasted  several hours but then resolved.  She continued to feel trace numbness.  CT of the head performed in the emergency department was unremarkable.  Blood pressure was initially 179/109.  She was admitted for further  evaluation and treatment.   PAST MEDICAL HISTORY:  1. Asthma.  2. Migraines.  3. Anxiety.  4. Hypertension.   HOSPITAL COURSE:  Problem 1.  Left-sided weakness/numbness with  headache.  The patient was admitted and underwent a CT of the head which  was negative.  This was followed by an MRI of the brain which was  negative for an acute infarct.  It did note some mild chronic ischemia  in the right frontal white matter.  MRA of the brain was negative.  She  continued to have symptoms which also included left upper extremity  weakness.  We then requested a consult from neurology, and the patient  was seen by Dr. Clearence Cheek.  His  feeling was that occasionally a  small infarct can be missed on an MRI which was motion degraded, and he  recommended repeating the MRI of the brain without contrast to rule out  stroke, especially with the symptoms of acute onset.  In addition, MRI  of the neck was performed with and without contrast.  He also felt that  other etiologies for these symptoms included migraine variant.  Followup  MRI of the brain was negative for CVA.  Cervical MRI noted mild disk  degeneration at C5-C6 without any spinal stenosis.  A 2-D echocardiogram  was performed during this admission which showed normal LV function and  no signs of embolus.  Carotid duplex was performed which showed no ICA  stenosis.  Hemoglobin A1c and homocystine were within normal limits.   The patient was noted to have mild dyslipidemia and was started on a  statin during this admission.   Problem 2.  Tachycardia.  The patient did develop sinus tachycardia with  heart rate as high as the 130s during this admission.  She was placed on  a low-dose beta blocker initially intravenously, and she is being  transitioned over now to oral Lopressor both for blood pressure control  and heart rate.  A D-dimer was performed during this admission to rule  out PE and was within normal limits.  Cardiac panel was also negative.   DISCHARGE MEDICATIONS:  1. Lopressor 25 mg tabs 1/2 tablet p.o. b.i.d.  2. Claritin 10 mg p.o. daily.  3. Singulair 10 mg p.o. daily.  4. Hydrocodone 7.5/500 p.o. daily.  5. Topamax 200 mg p.o. daily at bedtime.  6. Nexium 40 mg p.o. b.i.d.  7. ProAir 2 puffs as needed.  8. Zocor 20 mg p.o. daily.  9. Amoxicillin 500 mg caps, 2 capsules 3 times a day through December 09, 2007 for continued treatment of sinusitis as prior to      admission.  10.Prednisone 20 mg tabs, 1/2 tablet p.o. daily for 2 days and then      stop to complete prednisone taper for acute asthma exacerbation      prior to admission.    DISPOSITION:  The patient will be discharged to home.   FOLLOW UP:  The patient instructed to follow up with Dr. Viviana Simpler  in 1-2 weeks and contact the office for an appointment.      Debbrah Alar, NP      Darrick Penna. Swords, MD  Electronically Signed    MO/MEDQ  D:  12/07/2007  T:  12/08/2007  Job:  [id number]   cc:   Venia Carbon, MD

## 2011-03-02 NOTE — Assessment & Plan Note (Signed)
NAME:  [name], [name] NO.:  [id number]   MEDICAL RECORD NO.:  [id number]          PATIENT TYPE:  POB   LOCATION:  [facility] [place]:  [name], MD      DATE OF BIRTH:  Sep 16, 1966   DATE OF SERVICE:                                  CLINIC NOTE   This is a 45 year old female who presents for new-onset pain.  The  patient had a TVH sling done approximately six or seven months ago.  She  has now been having new-onset lower bilateral quadrant pain that does  not always coincide with ovulation.  She is on chronic Demerol for RSV.  She complains of urinary problems.  She voids well.  Occasionally she  feels like she needs to relax, and more will come out when she is on the  toilet, but this can be normal after a sling.  She also has chronic  constipation.  She only goes once every one to two weeks.  She has been  to a gastroenterologist in [place] in the past, and they have tried  multiple therapies, but nothing has helped.  She is cutting back on the  Demerol, so hopefully she will be able to have stools more frequently.  I offered her a gastroenterology consult.  She does not want one at this  time.   PHYSICAL EXAMINATION:  Uterus is absent, and both ovaries are felt and  do not feel enlarged.  However, we will get an ultrasound to evaluate.  No cystocele noted.  Breast exam was normal.   ASSESSMENT AND PLAN:  1. This is a 45 year old female with pelvic pain.  Will get vaginal      ultrasound.  2. The patient has had laparoscopy in the past for endometriosis      infertility.  The patient may need laparoscopic to evaluate the      ovaries if the transvaginal does not reveal anything.  There could      be scar tissue.  3. I hope this patient changes her mind and will get a      gastroenterology consult because this pain could be bowel related.  4. Return to clinic in two to three weeks after ultrasound.     ______________________________  [name], MD     KL/MEDQ  D:  03/16/2007  T:  03/16/2007  Job:  [id number]

## 2011-03-02 NOTE — Assessment & Plan Note (Signed)
HISTORY:  [name] returns today.  I last saw her on March 19, 2008.  She  has a history of RSD, left upper extremity.  She has had some overflow  into left lower extremity into left wrist and right lower extremity.   She continues to have some headaches.  [facility]   She did have some exacerbation of RSD treated with Medrol dose pack last  visit.   Her pain score is about 6/10, sharp, burning, stabbing involving  bilateral upper and lower extremities.  Pain is worse with walking,  bending, sitting, activity, and standing.  She can climb steps.  She  drives.  She needs some assistance with certain meal prep, household  duties, and shopping.   REVIEW OF SYSTEMS:  Positive for numbness, tremor, tingling, and  anxiety.   PHYSICAL EXAMINATION:  VITAL SIGNS:  Blood pressure 126/81, pulse 76,  respiratory rate 20, and O2 sat 97% on room air.  GENERAL:  In no acute distress.  Mood and affect appropriate.  Orientation x3.  She looks tired.  Gait is normal.   She has normal deep tendon reflexes in bilateral upper and lower  extremities.  She has decreased sensation in bilateral feet compared to  the knees.  She has normal sensation in the left upper extremity as well  as the right upper extremity.   Range of motion is normal.  Strength is 4/5 in the left upper extremity  and left ankle, otherwise 5/5.   IMPRESSION:  Reflex sympathetic dystrophy primarily emanating from left  upper extremity.   PLAN:  1. Given that her overall goal is to reduce narcotic analgesics, we      will trial her on alendronate once a week that is mainly looked at      IV, but we will trial her on p.o.  2. Continue hydrocodone 7.5 nightly.  3. I have written her a 1-year prescriptions for Topamax 100 mg q.a.m.      and 200 nightly.  4. Cymbalta 30 mg 3 p.o. daily.  5. Tizanidine 4 mg 1/2 p.o. b.i.d. and 1 p.o. at bedtime.  6. Lidoderm patch up to 3 patches per  day.  See her back in a month's      time.      [name], M.D.  Electronically Signed     AEK/MedQ  D:  04/16/2008 12:01:08  T:  04/17/2008 06:53:50  Job #:  [id number]

## 2011-03-02 NOTE — Assessment & Plan Note (Signed)
The patient returns today.  She has left upper extremity reflex  sympathetic dystrophy due to a work injury in 1990s, has been on chronic  narcotic analgesics, with a goal of coming off.  We have been able to  taper her over time.   Her motor strength remains 5/5 bilateral upper extremities.  Her upper  extremities show no evidence of vasomotor changes.  Good pulses  bilateral hands, no nail dystrophic changes.   IMPRESSION:  Reflex sympathetic dystrophy, left upper extremity.   PLAN:  1. We will continue hydrocodone 5/500 at bedtime.  We will give her      tramadol extended release 100 mg per day sample, we will continue      Cymbalta 90 mg per day.  2. Continue Zanaflex 2 mg t.i.d.  3. Continue Topamax 100 mg per day.   I will see her back in 1 month.  Goal is to get her off the hydrocodone,  this is the goal of the patient.  Hopefully, the tramadol will be  helpful in this regard.      [name], M.D.  Electronically Signed     AEK/MedQ  D:  11/07/2008 09:36:50  T:  11/07/2008 22:25:05  Job #:  [id number]   cc:   Arlyce Harman, [place] [address]  Fax - [phone]

## 2011-03-02 NOTE — Assessment & Plan Note (Signed)
HISTORY:  A 45 year old female with RSD, primarily the left wrist and  hand.  She was seen by me last on September 21, 2007.  She has been  reduced on her MS Contin to 15 mg daily.  She has been doing relatively  well on that.  She would like to get off of narcotic analgesics and be  maintained on Topamax and Cymbalta if possible.   She had facial flushing and itching after [facility].  She was also told that she may have cardiac  arrhythmia but has not discussed this yet with Dr. Venia Carbon.   ALLERGIES:  DEMEROL, VALIUM, TYLOX.   The pain is left, greater than right, upper and lower extremity.  It is  described as constant, tingling, aching, stabbing, sharp and burning.  Sleep is poor.  This appears to be multifactorial, not only so much pain  but restless legs.  Numbness, tingling and spasms particularly in the  left upper extremity.   MEDICATIONS:  1. As noted above, Topamax 50 mg q.a.m. and 200 mg q.h.s.  2. Cymbalta 90 mg daily.  3. MS Contin CR 50 mg daily.  4. A Lidoderm patch on the left wrist q.a.m. and off q.p.m.   PHYSICAL EXAMINATION:  VITAL SIGNS:  Blood pressure 139/97, rechecked at  146/98.  Will notify Dr. Silvio Pate of this.  Pulse 84, respirations 18, O2  saturation 99% on room air.  GENERAL:  In no acute distress.  Mood and affect appropriate.  NEUROLOGIC:  Gait is normal.  She has hypersensitivity in the left upper  extremity, greater than the left lower extremity in a non-dermatomal  pattern.  No swelling or skin discoloration in the upper extremity.  Her  grip in the biceps, triceps and deltoid is 4/5 on the left side and  limited by pain inhibition.  Left ankle dorsiflexor limited by pain  inhibition.  Otherwise 5/5 strength in the left lower as well as the  right upper and right lower extremity.  Deep tendon reflexes normal.   IMPRESSION:  Reflex sympathetic dystrophy, primarily in the left upper  extremity, doing  quite well on the above medication regimen.   PLAN:  1. Will switch her from MS Contin to hydrocodone 10/325 mg, one p.o.      daily.  Otherwise the medications will remain the same.  2. I will see her back in one month to see how she is doing on that      regimen and see if we can go down to 7.5 mg on the hydrocodone.  3. Also told to follow with Dr. Silvio Pate regarding what sounds like an      electrocardiogram abnormality, as well as her blood pressure.      [name], M.D.  Electronically Signed     AEK/MedQ  D:  10/24/2007 10:09:44  T:  10/24/2007 13:24:11  Job #:  [id number]   cc:   Venia Carbon, MD  [address] [place], [place] [address]

## 2011-03-02 NOTE — Assessment & Plan Note (Signed)
[name] returns today for treatment of reflex sympathetic dystrophy,  left upper extremity.  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   She has had some exacerbation of her left upper extremity pain since I  last saw her.  We reduced her hydrocodone to 5/325 a night and she feels  like she is not sleeping quite as well.  She indicates her average pain  is 5 to 6 out of 10.  The pain interferes with activity at a moderate  level, i.e., 6/10.  Sleep has been poor.  She is to receive a TENS unit.  Her relief from meds is fair.  She drives.  She is independent with her  self-care but needs some assistance with household duties, shopping and  meal prep.   REVIEW OF SYSTEMS:  Positive for numbness, tingling and spasms.  She has  had some flareup of her back pain, which I stated that I am not treating  her for.   PHYSICAL EXAMINATION:  VITAL SIGNS:  Her blood pressure is 133/72, pulse  76, respiratory rate 18, O2 sat 98% on room air.  GENERAL:  In no acute distress.  Mood and affect are appropriate.  __________ is  normal.  Her motor strength is 4/5 on the left side, 5/5  on the right side, which is baseline for her.  She has pain inhibiting  full strength.   Her spine range of motion is limited with full range.   IMPRESSION:  Reflex sympathetic dystrophy, left upper extremity.  She  has had some exacerbation, which I believe is likely due to poor sleep.  We will give her a Medrol Dosepak to help her get over this and increase  her hydrocodone back to 7.5/325 q.h.s.  We will continue her Topamax at  100 q.a.m. and 200 q.h.s. and continue Cymbalta 90 daily as well as  Zanaflex 2 mg b.i.d. and 4 mg q.h.s.  I will see her back in 1 month.  She will come off her ibuprofen while she is on the Medrol Dosepak,  and  she can resume her ibuprofen again.      [name],  M.D.  Electronically Signed     AEK/MedQ  D:  03/19/2008 10:20:39  T:  03/19/2008 10:48:16  Job #:  [id number]

## 2011-03-02 NOTE — Assessment & Plan Note (Signed)
[name] has a history of reflex sympathetic dystrophy, left upper  extremity.  She has problems with hand pain.  She has had lot of  depression and anxiety.  We have been trying to taper her hydrocodone.  She is down to one tablet at night, which has been enough to reduce some  of her nighttime pain.  She has multifactorial sleep disturbance at  night and believes some of this has to do with her depression and  anxiety related to her chronic pain syndrome.   Her pain is described as sharp, burning, stabbing, constant, tingling,  and aching.   REVIEW OF SYSTEMS:  Positive for numbness, tremor, tingling, spasms,  depression, anxiety, nausea, and constipation.   Oswestry disability index 68%.   PHYSICAL EXAMINATION:  VITAL SIGNS:  Blood pressure is 143/73, pulse 56,  respirations 18, O2 sat 97% on room air.  GENERAL:  In no acute distress.  Orientation x3.  Affect is alert.  Gaze  is normal.  EXTREMITIES:  Without edema.  Coordination normal.  Deep tendon reflexes  are normal.  Upper extremity and lower extremity range of motion normal.  She does have hypersensitivity to touch left hand.   IMPRESSION:  1. Reflex sympathetic dystrophy.  2. 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   PLAN:  1. I will continue hydrocodone 5/500 nightly.  2. Recommending her to see Dr. Karie Georges from Psychology at [facility].      [name], M.D.  Electronically Signed     AEK/MedQ  D:  08/13/2008 11:40:29  T:  08/14/2008 00:23:17  Job #:  [id number]

## 2011-03-02 NOTE — Consult Note (Signed)
NAME:  JOWANA, THUMMA NO.:  [id number]   MEDICAL RECORD NO.:  [id number]          PATIENT TYPE:  OBV   LOCATION:  3030                         FACILITY:  [facility]   PHYSICIAN:  Shaune Pascal. [place]am[place], M.D.DATE OF BIRTH:  08/27/1966   DATE OF CONSULTATION:  DATE OF DISCHARGE:                                 CONSULTATION   REQUESTING PHYSICIAN:  Valerie A. Asa Lente, M.D.   REASON FOR CONSULTATION:  Left-sided paresthesias.   [name] is a 45 year old Caucasian female with multiple medical  problems who presents with a few-day history of sudden onset of left-  sided numbness, tingling, warm sensations on the left side of her body.  She stated about one week ago, she also had a lightheaded syncopal  episode when she rose up quickly.  Then a few days ago, she developed  sudden onset of left-sided numbness and tingling, which is in her entire  left side.  This has improved and was just involving the left upper  extremity and face, to which she is very sensitive to touch.  In  addition, she also developed a headache after her symptoms did improve  and with blurry vision.  She did say she also had some difficulty with  speech, getting words out correctly.  She denies any vertigo, falls, or  loss of consciousness.  Patient also feels weak on the left side when  compared to her right.   PAST MEDICAL HISTORY:  Positive for migraines, asthma, hypertension, and  anxiety.   CURRENT MEDICATIONS:  Include Cymbalta, amoxicillin, Topamax,  prednisone, Protonix, and Claritin.   ALLERGIES:  VALIUM, DEMEROL.   FAMILY HISTORY:  Positive for heart disease, stroke, hypertension,  diabetes.   SOCIAL HISTORY:  Patient lives with her husband.  Denies any smoking.  Socially drinks alcohol.   REVIEW OF SYSTEMS:  Positive as per HPI.  Review of systems is negative  as per HPI in greater than seven other organ systems.   PHYSICAL EXAMINATION:  VITALS:  Blood pressure 132/79, pulse  86,  respirations 13, O2 sat 97%.  HEENT:  Normocephalic and atraumatic.  Extraocular muscles are intact.  Pupils are equal, round and reactive to light.  NECK:  Supple.  HEART:  Regular.  LUNGS:  Clear.  ABDOMEN:  Soft.  EXTREMITIES:  Good pulses.  NEUROLOGIC:  Patient is awake, alert and oriented.  Language is fluent.  Patient follows commands appropriately.  Cranial nerves II-XII are  grossly intact.  Motor examination shows 4/5 in the left upper extremity  and 4/5 strength in the left lower extremity, 5/5 strength on the right.  Patient does have a left upper extremity drift noted.  Sensory  examination has decreased sensation in the left upper extremity and the  face when compared to the right and is very sensitive to touch.  Reflexes are 1-2+ and symmetric.  Cerebellar function is within normal  limits, finger to nose.  The gait was not assessed secondary to safety.   MRI of the brain showed no acute infarct.  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  MRA was negative.  LABS:  WBCs 15.2, hemoglobin 13.8, hematocrit 41.3, platelets 286.  Cholesterol is 287, LDL 122, PT 12.4, INR 0.9, PTT 22.  Sodium 140,  potassium 3.9, chloride 106, CO2 26, BUN 12, creatinine 0.83, glucose  97.   A 2D echo showed an EF of 55%.   Carotid Dopplers are negative.   IMPRESSION:  A 45 year old with left-sided weakness, numbness, and  headaches with no acute infarction on MRI.  Occasionally, small  infarctions can be missed on MRI, which was motion-degraded.  We will  repeat the MRI of the brain without contrast to rule out stroke,  especially with the symptoms of acute onset.  We will include the MRI of  the neck with and without contrast.  Rule out other etiologies for the  symptoms.  This patient did complain of some slight neck discomfort.  We  will start the patient on an aspirin a day, given the risk factors for  small vessel disease seen on MRI scan.  Patient does  also seem to have  elevated cholesterol and will start a statin today.  The differential  diagnosis includes small infarct, risk of migraine variant.  Homocysteine level is pending.  We will get PT/OT consults.  Will follow  patient as consultants.      Shaune Pascal. Estella Husk, M.D.  Electronically Signed     DRC/MEDQ  D:  12/06/2007  T:  12/06/2007  Job:  [id number]

## 2011-03-02 NOTE — Assessment & Plan Note (Signed)
NAME:  [name], [name] NO.:  [id number]   MEDICAL RECORD NO.:  [id number]           PATIENT TYPE:   LOCATION:  [facility]:   PHYSICIAN:  [name], MD      DATE OF BIRTH:  11-May-1966   DATE OF SERVICE:                                  CLINIC NOTE   The patient presents status post oophorectomy.  The patient had a benign  pathology report and did very well postoperatively.  She had very little  adhesions on the right.  Still has occasional pain; however, she is  extremely constipated.  She has not had a bowel movement since the  operation, which has been 3-4 weeks ago.  She is to go back to her  gastroenterologist and work on her IBS and constipation.  If she is  still experiencing pelvic pain, they can refer her to either the  physical therapist at [facility] or the pelvic pain department at [facility] which is all inclusive, multifaceted.  And had had a left ovary  which seemed to be attached to her bladder; however, she is not having  any pain on the left, and this was left alone.  I did note in my  dictation that if she needed to ever have a left oophorectomy maybe  someone with advanced laparoscopy skills or possibly someone with  urology on standby in case a cystotomy occurs.  In the meantime, the  patient needs to work on moving her bowels regularly with soft stool.  The patient to follow up p.r.n.  Next mammogram is due June of 2009.  Continue calcium, multivitamin and all other medications.           ______________________________  [name], MD     KL/MEDQ  D:  08/17/2007  T:  08/17/2007  Job:  [id number]

## 2011-03-02 NOTE — Assessment & Plan Note (Signed)
NAME:  KELLIN, FIFER NO.:  [id number]   MEDICAL RECORD NO.:  [id number]          PATIENT TYPE:  POB   LOCATION:  [facility] [place]:  [name], MD      DATE OF BIRTH:  10/13/1966   DATE OF SERVICE:  08/12/2008                                  CLINIC NOTE   The patient is a 45 year old female who is feeling very irritable  lately.  She is already on Cymbalta for RSD.  I did check an FSH, it was  61.  The patient's menopause could complex some of her irritability.  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  I do not really want to put her  on any estrogen.  We talked about Effexor, but she is already on  Cymbalta, and I do not think this would be a good mix.  We talked about  increasing the salt in her diet and also going on black cohosh.  We  suggested and the patient seemed to be receptive.  If the symptoms are  unbearable, the patient to come back and can have a maybe start on  clonidine.           ______________________________  [name], MD     KL/MEDQ  D:  08/12/2008  T:  08/13/2008  Job:  [id number]

## 2011-03-02 NOTE — Assessment & Plan Note (Signed)
FOLLOW UP VISIT   A 45 year old female with reflex sympathetic dystrophy primarily left  wrist and hands last seen by me on 05/11/2007.  She is on MS Contin 50  mg once a day.  In fact she has taken probably less than once a day.  Has tried a couple times without it.  We did increase her Topamax about  2 months ago to 50 in the morning and 200 q. at bedtime.  Other  medications related to her pain symptoms include Cymbalta 90 mg per day.   She needs some assistance with household duties and shopping.   REVIEW OF SYSTEMS:  Numbness, tremor, tingling, spasms, dizziness,  nausea, constipation are all in her review of systems, as well as some  swelling.   PHYSICAL EXAMINATION:  Blood pressure 123/82; pulse 88; respirations 18;  O2 saturation 98% on room air.  GENERAL:  In no acute distress.  Mood  and affect appropriate.  Her pain level is 7/10.  Sleep is poor, which  she thinks is a combination of factors. Her back has good range of  motion.  Upper extremity range of motion is normal.  Her strength is 5/5  on the right side but 4/5 on the left side and this is partially due to  pain and difficulty resisting.  Her gait is normal.  Her skin shows no  erythema or swelling in the left upper extremity but does have  hypersensitivity to touch.   IMPRESSION:  Left upper extremity reflex sympathetic dystrophy, chronic.   PLAN:  1. Continue Topamax 50 q. P.m.  2. Continue Cymbalta 90 mg a day.  3. Continue MS Contin 15 mg she will take it 1 a day average in the      interval time.  She is planning to undergo a laparoscopic procedure      to look at her ovaries and possibly ovariectomy.  I will see her      back in 6 weeks because of timing issues with this.      [name], M.D.  Electronically Signed     AEK/MedQ  D:  06/09/2007 13:34:24  T:  06/10/2007 15:14:51  Job #:  [id number]

## 2011-03-02 NOTE — Assessment & Plan Note (Signed)
The patient returns today for treatment of reflex sympathetic dystrophy  left upper extremity. She has been fully tapering off narcotic analgesic  medications per her wishes. Went down to 7.5/325 one per day of  hydrocodone last month.   CURRENT MEDICATIONS:  1. Hydrocodone 7.5/325 one per day.  2. Topamax 50 q.a.m. and 200 q.h.s.  3. Cymbalta 90 mg a day.  4. Lidoderm patch left wrist on q.a.m., off q.p.m.   INTERVAL MEDICAL HISTORY:  Hospitalization, left-sided weakness  associated with migraine. MRI of the head was normal. Echocardiogram was  normal. MRA of the neck was normal.   Current pain level 7 out of 10. Sleep is poor. Pain interferes with  activity at a moderate level, describes it as constant tingling, aching,  sharp burning, stabbing. She continues to drive. 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   Her blood pressure is 132/82. Pulse 67, respirations 18, O2 sat 97% on  room air.  GENERAL:  No acute distress. Mood and affect appropriate.   Her gait is normal. She is able to toe walk, heel walk as well as tandem  gait. She has 4/5 left deltoid, biceps, triceps and grip which is her  usual.   Cranial nerves 2-12 are intact. Right-sided strength is 5/5. Lower  extremity strength is 5/5 in the hip flexors, knee extensions, ankle  dorsiflexions. Deep tendon reflexes are normal.   IMPRESSION:  Reflex sympathetic dystrophy primarily affecting the left  upper extremity. She does have 4/5 strength which is chronic and related  to decreased efforts secondary to pain inhibition.   PLAN:  We'll continue current medications. We'll see her back in one  month. She would like to resume taper of narcotic analgesic medication.      [name], M.D.  Electronically Signed     AEK/MedQ  D:  12/25/2007 09:30:06  T:  12/25/2007 13:01:52  Job #:  [id number]   cc:   Venia Carbon, MD  [place], [place] [address]   Bill Adelman   Tinnie Gens  [phone]

## 2011-03-02 NOTE — Assessment & Plan Note (Signed)
HISTORY OF PRESENTING ILLNESS:  The patient returns today for treatment  of reflex sympathetic dystrophy, left upper extremity. We have been  slowly tapering her off of narcotic analgesic medications at her own  request.  She has been taken off the MS Contin and just placed on  hydrocodone 10/325 one per day.  She had a temporary flare up of her  pain in her left upper extremity but this evened out after a few days.   CURRENT MEDICATIONS:  1. Topamax 50 every a.m. 200 mg at every bedtime.  2. Cymbalta  90 mg a day.  3. Lidoderm patch  left wrist, on every a.m. and off every p.m.   REVIEW OF SYSTEMS:  Pain level about 8/10.  She feels bad today just  because she has a cold and has not been sleeping well.  The pain is  described as sharp, burning, stabbing in the left upper extremity.  She  has numbness,  spasms and some dizziness.  Her blood pressure is 139/91.  Pulse 109.  Respiratory rate 18.  O2 SAT 98 percent on room air.   Her left upper extremity has hypersensitivity to light touch.  Non  dermatomal pattern.  No swelling.  No discoloration or dystrophic  changes in the nail.  She has 4/5 strength in the left deltoid biceps,  triceps, grip.  This is limited by pain inhibition.   She has 5/5 strength in the right upper extremity.   IMPRESSION:  1. Reflex sympathetic dystrophy left upper extremity doing quite well      on her medication regimen.  She would like to be off narcotic      analgesics and to this effect will slowly continue to reduce her.      Will just give her 7.5 hydrocodone every day and see her back in 1      month.  2. Continue Topamax 50 every a.m. and 200 at every bedtime.  3. Continue Cymbalta  90 mg a day.  4. Continue Lidoderm patch  left wrist on every a.m. and off every      p.m.   The patient also wanted to make sure that we knew her pharmacy was [facility]  [facility] in __________  [facility].      [name], M.D.  Electronically Signed     AEK/MedQ  D:  11/27/2007 16:15:15  T:  11/28/2007 14:27:36  Job #:  [id number]   cc:   Venia Carbon, MD  [address] [place], [place] [address]

## 2011-03-02 NOTE — Assessment & Plan Note (Signed)
NAME:  [name], MOLNAR NO.:  [id number]   MEDICAL RECORD NO.:  [id number]          PATIENT TYPE:  POB   LOCATION:  [place] [facility] [place]:  [name], MD      DATE OF BIRTH:  [birth year]   DATE OF SERVICE:  03/29/2007                                  CLINIC NOTE   The patient is a 45 year old female who is still having pelvic pain.  She did have a transvaginal ultrasound which showed a 1.3x1.0x1.2  cyst  within the right ovary, most likely follicular.  The other ovary was  small and normal in size.  I doubt this is where her pain is coming  from.  She does have most likely irritable bowel syndrome with  intermittent severe constipation and diarrhea, mostly constipated.  She  has had one episode of diarrhea recently.  She is getting a colonoscopy  in July and I would want her to meet with a doctor prior and talk about  her pain so she can get a workup and maybe avoid surgery.  If not that  gastroenterologist does not find anything, then she would like for me to  go ahead and do a diagnostic laparoscopy and possibly oophorectomy for  the pain.  She already is on narcotic for reflex sympathetic dystrophy,  and she is weaning that.  So hopefully, this will help her stools.  Will  go ahead and schedule her for the surgery late July or early August.  After all these tests she is already in the system, and does not have to  wait longer if the gastroenterology consult proved not to find anything.  The patient is to come to the office after July to go over test results.           ______________________________  [name], MD     KL/MEDQ  D:  03/29/2007  T:  03/29/2007  Job:  [id number]

## 2011-03-02 NOTE — Assessment & Plan Note (Signed)
DATE OF LAST VISIT:  August 13, 2008.   [name] has a history of reflex sympathetic dystrophy, left upper  extremity and problems with left hand pain.  She has also had depression  and anxiety.  We have been able to taper her over time off high dose  Avinza.  At one point, she was on that 90 mg a day and over time, it has  gotten down to just 15 mg tablet of hydrocodone at night.  She really  wants to come off completely; however, when she tried to go down to half  tablet at night, she really could not sleep and had several nights where  she really did not sleep very well, and her pain level did go up, her  anxiety and depression did go up as well.   Her Oswestry disability index score is 60%.   Her left upper extremity strength is 4/5, right upper extremity strength  is 5/5.  Deep tendon reflexes are normal.  Lower extremity strength and  range of motion are normal.  She does have some hypersensitivity to  touch in the left upper extremity.   IMPRESSION:  1. Reflex sympathetic dystrophy.  2. Anxiety-related physical condition.   PLAN:  1. Continue hydrocodone 5/500 nightly.  I have given some tramadol 100      mg nightly for pain if she can substitute this in place of      hydrocodone.  2. Recommend to see Dr. Lyman Speller from psychology at [facility].      [name], M.D.  Electronically Signed     AEK/MedQ  D:  09/10/2008 13:07:20  T:  09/11/2008 02:06:38  Job #:  [id number]   cc:   Claudia Pollock. Lyman Speller, Ph.D.

## 2011-03-02 NOTE — Assessment & Plan Note (Signed)
[name] has a history of RSD, left upper extremity.  She has had some  overflow into left lower extremity.  She continues to have headaches.  [facility].  No further  exacerbations of her RSD.  We discussed bisphosphonate stating that  studies really looking more at IV versus p.o. for RSD treatment.  Trial  of p.o. may be useful.   Pain is described as sharp, burning, stabbing, constant, tingling, and  aching, 6/10 interference scores, 6-7/10 average pain scores.  Sleep is  poor but is multifactorial.  She does see a sleep specialist.  Pain  interferes with household duties and shopping.  Complains of numbness,  tremor, tingling, and spasms in the upper extremity.   PHYSICAL EXAMINATION:  VITAL SIGNS:  Blood pressure is 138/92, pulse 78,  respiratory rate 18, and O2 sat 98% on room air.  GENERAL:  Well-developed, well-nourished female in no acute distress.  Gait is normal with hypersensitivity to touch left upper extremity.  Motor strength is 4/5 in left upper extremity and 5/5 in the right  deltoid, biceps, triceps, grip, but this is pain related.  Deep tendon  reflexes are normal in upper and lower extremities.   IMPRESSION:  Reflex sympathetic dystrophy primarily emanating from the  left upper extremity.   PLAN:  We will reduce her hydrocodone 5/500 one p.o. nightly, continue  Cymbalta 30 mg t.i.d., continue tizanidine 4 mg one half p.o. b.i.d. 1  p.o. nightly, resume Lidoderm patch up to 3 per day.  I will see her  back in a month's time at which time we will likely go down to half of  hydrocodone at night.      [name], M.D.  Electronically Signed     AEK/MedQ  D:  05/14/2008 14:16:48  T:  05/15/2008 03:48:26  Job #:  [id number]

## 2011-03-02 NOTE — Assessment & Plan Note (Signed)
Patient with left upper extremity RSD due to work-related injury.  She  played some basketball with his son, had a bit of flare-up, got a bit  worried about that.  Her average pain is 5-6/10, currently 9 in her left  upper extremity.  She has had some neck pain as well as spasms in the  upper back area.   REVIEW OF SYSTEMS:  Numbness, tremor, tingling, depression, anxiety,  skin redness on the left upper extremity, nausea, constipation.   Blood pressure 140/90, pulse 81, respirations 18, O2 sat 99% room air.  Well-developed, well-nourished female in no acute stress.  The left  upper extremity has mild swelling in the hand, no erythema.  She has  hypersensitivity to touch.  Upper extremity strength 4/5 which is  chronic due to pain related decreased effort.   IMPRESSION:  Reflex sympathetic dystrophy, left upper extremity and work-  related injury, chronic.   PLAN:  1. Continue Ultram ER 100 mg a day.  2. Continue hydrocodone 5/500 one p.o. at bedtime.  3. Continue tizanidine 4 mg one half p.o. b.i.d. and one p.o. at      bedtime.  4. Continue Lidoderm patch up to 3 patches on 12, off 12.  5. Continue Topamax 100 mg p.o. q.a.m. and 200 p.o. at bedtime.  6. Continue Cymbalta 90 mg per day.      [name], M.D.  Electronically Signed     AEK/MedQ  D:  02/03/2009 16:50:46  T:  02/04/2009 06:09:07  Job #:  [id number]

## 2011-03-02 NOTE — Assessment & Plan Note (Signed)
[name] returns today, a 45 year old female with reflex sympathetic  dystrophy, primary left wrist and hand.  Last seen by me on June 09, 2007.  In the interval time period, she has undergone partial  hysterectomy, oophorectomy, per Dr. Gala Romney.  Postoperatively, she has  been using hydrocodone from the surgeon's, but she felt like this really  did not help her much, and she went back to her MS Contin, 15 mg.  She  took 1-2 tablets per day.  Despite this, she has not run out early.   Her sleep has been poor.  Her relief from meds is fair to good.  She  climbs steps.  She just started driving again postoperatively today.  She is about 2-1/2 weeks postop.  She still needs some help with  hospital duties and shopping.   REVIEW OF SYSTEMS:  Positive for constipation.   PHYSICAL EXAMINATION:  Blood pressure 134/83.  Pulse 90.  Respirations  18.  O2 sat is 98% on room air.  GENERAL:  No acute distress.  Mood and affect appropriate.  She has  allodynia, left upper extremity.  No skin color changes.  No nail  changes.  Full range of motion in the hand and wrist area.  Shoulder has mildly  diminished range of motion.  Unsure if this is pain-related, she feels  very stiff today.  Her gait shows no evidence of toe drag today.   IMPRESSION:  1. Reflex sympathetic dystrophy, left upper extremity.  Has been doing      well on a narcotic taper.  However, will bump up her MS Contin to      40 tablets per month, given that she has had some problems with      postoperative pain management.  Certainly, she is not overdoing it.  2. I will see her back in one month to see how she is doing and likely      resume her taper off narcotic analgesics.      [name], M.D.  Electronically Signed     AEK/MedQ  D:  07/20/2007 14:47:47  T:  07/21/2007 09:37:37  Job #:  [id number]   cc:   Glori Bickers   Trish Rosette Reveal, MD  [place], [place]  [address]   Guss Bunde, M.D.

## 2011-03-02 NOTE — Assessment & Plan Note (Signed)
A 45 year old female with reflex sympathic dystrophy primarily left  wrist and hand. I last saw her on February 14, 2007.   Patient has been off of Avinza and switched to Kadian 20 mg per day.  This has resulted in no problems with withdrawal syndrome. She would  like to continue tapering off of narcotic analgesics. She just does not  like the way they make her feel. She has had no new interval medical  history.   Pain is rated at a 5 out of 10 which is an improvement compared to  previous when she was really taking this every other day Avinza 30 mg.  Her pain is rather constant throughout the day, described as sharp,  burning, intermittent, stabbing, constant, tingling, aching, improving  with rest and pacing activities, medications, and, increasing with  either too much activity or too little. She drives, she takes care of  her 48-year-old twins. She has problems with numbness, tingling, spasms,  dizziness, anxiety, but no suicidal thoughts, or depression.   Her blood pressure is 138/96, pulse 93, respiratory rate is 18, O2 sat  96% on room air.  IN GENERAL: No acute distress, mood and affect appropriate. Orientation  x3. Gait is normal.   She has no evidence of swelling in the upper or lower extremities. She  has hypersensitivity to touch in the left upper extremity with  hyperhidrosis in the palm. She has mildly diminished internal and  external rotation of her left shoulder and this has been bothering her a  bit lately.   IMPRESSION:  Reflex sympathetic dystrophy.   PLAN:  1. Continue on narcotic analgesic tapering. We will give her MS Contin      15 mg to take once a day, did warn her that she will not have any      coverage during the night. I have given her an extra 10 tablets if      she has nighttime problems that keep her awake.  2. Will increase Topamax to 50 mg q.a.m. and continue 200 q.p.m.  3. Continue Cymbalta 90 mg per day. I will see her back in 1 month.      [name], M.D.  Electronically Signed     AEK/MedQ  D:  03/14/2007 16:02:42  T:  03/14/2007 17:22:48  Job #:  [id number]   cc:   Glori Bickers  [address] [address]  [place], [place] [address]

## 2011-03-02 NOTE — Assessment & Plan Note (Signed)
[name] has a history of RSD left upper extremity, overflow in his  left lower extremity.  Continues to have problems with hand pain  manually on the left side.  She has had a lot of anxiety and depression,  recently had to cool off on some drugs, feels like she is arguing with  family.   Her pain level is 6-7/10, sharp, burning, intermittent, stabbing,  constant, tingling and aching.  She needs assistance with household  duties and shopping.  She has problems with constipation.   MEDICATIONS:  1. Zanaflex 4 mg nightly.  2. Hydrocodone 5/500 1 p.o. nightly.  3. Tizanidine 4 mg 1-1/5 b.i.d. and 1 nightly.  4. Lidoderm patch up to 3 patches on 12 hours.  5. Topamax 100 mg every morning.   PHYSICAL EXAMINATION:  She has no vasomotor changes of the hands.  She  does have hypersensitivity to touch of the left hand compared to the  right.  She has grip strength 4/5 in the upper extremities, strength 4/5  in the left hand and 5/5 in the right hand, and gait is normal.   Oswestry disability score is 54%.   IMPRESSION:  1. Reflex sympathetic dystrophy, left upper extremity.  2. Chronic pain syndrome with psychologic comorbidities.  She can      benefit from relaxation coping skills, we will refer her to Dr.      Almyra [name] in regards to this.  I will see her back in 1 month      for followup.      [name], M.D.  Electronically Signed     AEK/MedQ  D:  07/09/2008 12:49:08  T:  07/10/2008 03:45:18  Job #:  [id number]   cc:   [place] [address]

## 2011-03-02 NOTE — Assessment & Plan Note (Signed)
HISTORY OF PRESENT ILLNESS:  [name] has a history of reflex  sympathetic dystrophy of left upper extremity, problems with left hand  pain, and history of depression and anxiety.  She has had some flareup  of her RSD recently.  She is overall trying to come off hydrocodone.  She rarely just takes this at night, which does help with her pain.  Her  left upper extremity strength is 4/5, upper extremity strength in right  is 5/5, and deep tendon reflexes are normal.  Lower extremity strength  and range of motion normal.  Hypersensitivity to touch in the left upper  extremity at the hand and wrist.   IMPRESSION:  1. Reflex sympathetic dystrophy.  2. 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   PLAN:  1. Continue hydrocodone 5/500 at bedtime.  2. We will try her on Ultram 100 mg per day.  It should not cause any      serotonin syndrome with Cymbalta at this dose, however, if we      decide to go up to 200 mg, we will need to reduce her Cymbalta      dose.  3. We will continue her tizanidine as well as her Topamax.  4. I will see her back in 1 month.  At that time, we will see if we      can get her off the hydrocodone, reduce Cymbalta, and increase her      tramadol.      [name], M.D.  Electronically Signed     AEK/MedQ  D:  10/08/2008 17:01:41  T:  10/09/2008 05:56:25  Job #:  [id number]

## 2011-03-02 NOTE — H&P (Signed)
NAME:  [name], [name] NO.:  [id number]   MEDICAL RECORD NO.:  [id number]          PATIENT TYPE:  INP   LOCATION:  5631                         FACILITY:  [facility]   PHYSICIAN:  Annita Brod, M.D.DATE OF BIRTH:  30-Aug-1966   DATE OF ADMISSION:  12/04/2007  DATE OF DISCHARGE:                              HISTORY & PHYSICAL   PRIMARY CARE PHYSICIAN:  Venia Carbon, M.D.   REHAB PHYSICIAN:  [name], M.D.   CHIEF COMPLAINT:  Left-sided numbness.   HISTORY OF PRESENT ILLNESS:  The patient is a 45 year old white female  with past medical history of asthma, migraines, and possibly underlying  hypertension, who today was in good health up until about 4 o'clock this  afternoon, when all of a sudden she said she started feeling sudden  onset left-sided burning as well as a numbness.  This is described as  starting on the left side of her face, but also radiating down her neck,  arm, and torso.  She said she also felt some associated numbness with  this.  These symptoms lasted for several hours and then resolved but she  says she still feels trace numbness.  She also noted some slurred speech  which seems to have also improved as well.  When the patient came in to  the emergency room for further evaluation, a CT scan of the head was  unremarkable; however, it was noted that the patient's blood pressure  initially was 179/109 and since then has stayed that way.  She also was  complaining of some generalized nonspecific pain which she described as  chest pain, however, when asked for location, it is actually below her  chest and across the upper quadrant of her abdomen radiating across like  a band.   REVIEW OF SYSTEMS:  She otherwise is doing well.  She complains of a  very mild headache that is coming on but no vision changes or dysphagia.  No current upper chest pain or palpitations.  No acute shortness of  breath.  No wheezing or coughing.  She  complains of the abdominal pain  as described above, otherwise, no other pain elsewhere.  No hematuria or  dysuria.  No constipation or diarrhea.  No focal extremity numbness,  weakness, or pain other than the residual left-sided numbness.  Review  of systems otherwise negative.   PAST MEDICAL HISTORY:  1. Asthma.  2. Migraines.  3. Anxiety.  4. The patient also reports a previous history of hypertension in that      her blood pressure has been in the 190s whenever it is checked and      has been that way several times, both at home and doctors' offices.      Her PCP has not yet started her on any medication for this.   MEDICATIONS:  1. The patient is on prednisone 20 as a tapering dose.  2. Topamax 200 at bedtime.  3. Amoxicillin 1000 mg p.o. q.12 h., stop in several days, for recent      sinus infection.  4. Cymbalta 90.  5. Percocet 7.5/625  p.o. daily.  6. Nexium 40 mg b.i.d.  7. Claritin 10 daily.  8. Singulair 10.  9. Pro Air MDI.   ALLERGIES:  1. She has allergies to DEMEROL.  2. VALIUM.  3. TYLOX.   SOCIAL HISTORY:  No tobacco, heavy alcohol, or drug use.   FAMILY HISTORY:  Noncontributory.   PHYSICAL EXAMINATION:  VITAL SIGNS:  On admission, temperature 97.8,  heart rate 122, blood pressure 179/109.  Since then her heart rate has  come down into the 90s.  Her blood pressure has come down to 159/98  after receiving some Ativan.  Respirations are 22, O2 saturation 98% on  room air.  GENERAL:  In general she is alert and oriented x3, no apparent distress.  HEENT:  Normocephalic, atraumatic.  Her mucous membranes are slightly  dry.  NECK:  She has no carotid bruits.  NEUROLOGIC:  Her cranial nerves II-XII are intact except for some  subjective right-sided numbness, which is different from the left-sided  that she was having earlier.  HEART:  Regular rate and rhythm, S1/S2.  LUNGS:  Clear to auscultation bilaterally.  ABDOMEN:  Soft, nontender, nondistended.   Positive bowel sounds.  EXTREMITIES:  Show no clubbing, cyanosis, or edema.  MUSCULOSKELETAL:  Her neurological exam is noted for some left-sided  upper grip strength weakness, but again it is really more subjective  than anything else.  The rest of her exam appears to be symmetric and  normal.  Negative for Babinski.   LABORATORY DATA:  White count 13.4, H&H 14 and 43, MCV of 87, platelet  count 304, no shift.  Sodium 141, potassium 3, chloride 108, bicarb 24,  BUN 16, creatinine 1, glucose 106.  Coags unremarkable.  Code stroke  panel  normal except for the white count.   CT scan of the head is unremarkable.  EKG shows normal sinus rhythm.   ASSESSMENT/PLAN:  1. Sudden onset left-sided numbness with increased temperature of both      face and body, very atypical findings.  Possibly could be      cerebrovascular accident, however, noting a negative CT, however      with the systolic blood pressures being elevated in the 170s-190s,      could be a migraine.  Will need to check an MRI.  This also could      be migraine versus anxiety.  2. Malignant hypertension, occurring for some time.  Needs to be      started on outpatient medications.  Will put her on p.r.n.      clonidine here.  3. Hypokalemia.  Will replace.  4. Asthma, stable.  5. History of migraines, stable.  6. History of anxiety.  7. Leukocytosis, likely secondary to recent prednisone use.      Annita Brod, M.D.  Electronically Signed     SKK/MEDQ  D:  12/04/2007  T:  12/05/2007  Job:  [id number]   cc:   [name], M.D.  Venia Carbon, MD

## 2011-03-02 NOTE — Assessment & Plan Note (Signed)
[name] returns today. This is a 45 year old female with reflex  sympathetic dystrophy primary left wrist hand area and last seen by me  on 07/20/2007.   The patient has had a partial hysterectomy and oophorectomy per Dr.  Gala Romney in September. We did have to bump up her MS Contin a bit to help  some with her postoperative pain, but she is really back to her baseline  at the current time. Her sleep has been poor. Her pain does improve with  rest, medication, and pacing her activities.   She remains on:  1. Topamax 50 mg q.a.m. and 200 mg nightly.  2. Cymbalta 60 mg daily.  3. MS Contin 15 mg daily, but she got an extra 10 tablets last month,      so some days she took 2. She will be back down to 1 again.   VITAL SIGNS:  Blood pressure 138/76, pulse 88, respirations 18, O2  saturation 97% on room air.  GENERAL:  No acute distress. Mood and affect are appropriate.  BACK:  Nontender to palpation.   She has hypersensitivity touch left hand, allodynia, no dysvascular  changes, good pulses, good nailbeds, no skin color changes. Range of  motion is mildly diminished left shoulder compared to the right side.   IMPRESSION:  Reflex sympathetic dystrophy left upper extremity. Resume  narcotic taper. Go down to 30 tablets of MS Contin per month. We will  see her back next month where we will likely go down to 15. Continue  Topamax and the Cymbalta.      [name], M.D.  Electronically Signed     AEK/MedQ  D:  08/17/2007 14:07:32  T:  08/18/2007 06:18:50  Job #:  [id number]   cc:   Celso Sickle  [phone]   Venia Carbon, MD  [place], [place] [address]   Guss Bunde, M.D.

## 2011-03-02 NOTE — Assessment & Plan Note (Signed)
NAME:  [name], [name] NO.:  [id number]   MEDICAL RECORD NO.:  [id number]          PATIENT TYPE:  POB   LOCATION:  [facility] [place]:  [name], MD      DATE OF BIRTH:  11/25/1965   DATE OF SERVICE:  06/01/2007                                  CLINIC NOTE   The patient is a 45 year old female who presents for a follow-up of  pelvic pain.  The pain seems to have gotten a little better, but when it  does occur on the right side it brings her to her knees.  She did have a  colonoscopy, which showed two polyps and she is awaiting the results of  that.  She did speak to her gastrologist who thought it could be related  to her irritable bowel syndrome, but could also be scar tissue, as what  we thought.  The patient wants to just go ahead with surgery and we will  do a diagnostic laparoscopy, right salpingo-oophorectomy, a possible  left salpingo-oophorectomy and possible laparotomy if the scar tissue is  severe.  The patient understands the risks of this procedure, including  but not limited to bleeding, infection, damage to the intra-abdominal  organs like bowel, bladder and ureter.  The patient was scheduled for  the first week of September, however she wants to change this date due  to family committments.  The patient will call my service scheduler and  find a date agreeable for both of [place].           ______________________________  [name], MD     KL/MEDQ  D:  06/01/2007  T:  06/02/2007  Job:  [id number]

## 2011-03-05 NOTE — Assessment & Plan Note (Signed)
REASON FOR VISIT:  A 45 year old female with reflexive sympathetic  dystrophy.  The patient last seen by me January 17, 2007.  She has been on  a wean of Avinza.  She has been skipping days, but this has been  difficult for her.  She has had some depression and anxiety related to  this.  She has had some increased lower extremity symptoms as well.  She  continues with mainly left upper extremity symptoms.  This includes  hypersensitivity to touch, as well as occasional swelling.   The pain is rated as 9/10 and fairly consistently throughout the day.   Blood pressure is 125/83, pulse 87, respirations 16, O2 saturation 99%  on room air.  GENERAL:  In no acute distress.  Mood and affect appropriate.  Left hand has no evidence of edema.  No erythema.  She does have  positive hypersensitivity to touch, 4/5 strength left upper extremity,  5/5 on the right side.  Lower extremity has good pedal pulses.  No skin  discoloration.  Good strength.   IMPRESSION:  Reflexive sympathetic dystrophy, left upper extremity.  Some generalization.   PLAN:  1. Continue Topamax 200 mg a day.  2. Continue Cymbalta 90 mg a day.  3. Will discontinue Avinza and start Kadian 20 mg per day.  I will see      her back in 1 month.  If she is doing well, we could start MS-      Contin 15 mg q. a.m.      [name], M.D.  Electronically Signed     AEK/MedQ  D:  02/14/2007 11:03:53  T:  02/14/2007 11:46:10  Job #:  [id number]   cc:   Orie Rout  Fax: [place]  Fax:  [phone]   Kathlee Nations

## 2011-03-05 NOTE — Assessment & Plan Note (Signed)
NAME:  [name], [name] NO.:  [id number]   MEDICAL RECORD NO.:  [id number]          PATIENT TYPE:  POB   LOCATION:  [place] [facility] [place]:  [name], MD      DATE OF BIRTH:  10-Aug-1966   DATE OF SERVICE:  08/11/2006                                    CLINIC NOTE   The patient is a 45 year old female who is 6 weeks postop from a [facility],  anterior colporrhaphy and __________  procedure.  The patient also had a  cystotomy during the procedure and is doing well.  Her only complaints is  that she has had some spotting since the procedure and some sharp abdominal  pains.  She has had no leakage of urine. She is back to most normal  activities.  She has not had sexual intercourse yet.   PHYSICAL EXAMINATION:  The abdomen is soft, nontender, nondistended, no  rebound, no guarding.  All incisions on the abdomen are well healed.  In the  vagina there is a small amount of old brown blood and there is some #0  Vicryl in the right cuff.  The stitch was removed.  There was no hole in the  cuff and no evidence of cellulitis.  The bladder was well supported and  there was no leakage of urine on cough. I believe, now that all of the  sutures are gone from the vagina and her bleeding will stop.  She is still  slightly tender to palpation in the left portion of the cuff.  She might  hold off on sexual intercourse for several more weeks and she heals, and the  patient is fine with this.  If the patient continues to bleed I would like  to see her, again, to see if I could elicit any area that is bleeding in  particular.  Otherwise the patient can return for her yearly exam in June.  Her last ultrasound was normal and there was no dysplasia on the  hysterectomy specimen.           ______________________________  [name], MD     KL/MEDQ  D:  08/11/2006  T:  08/12/2006  Job:  [id number]

## 2011-03-05 NOTE — Assessment & Plan Note (Signed)
CLAIM NUMBER:  [id number]   HISTORY:  The patient is a 45 year old female with reflex sympathetic  dystrophy of the upper limb left side onset approximately 15 years ago.  She has been on a slow wean down of her morphine and overall she has  been doing okay with this.  She has tried a couple days where she  skipped Avinza, she had mild to moderate achiness during these days.  She had no nausea or vomiting.   Her activity level remains good, cares for 33-year-old twins.  She can  walk 20 minutes at a time.  She climbs steps.  She drives.  She has  nausea and constipation and limb swelling of her left upper extremity.  At times, shortness of breath.   PHYSICAL EXAMINATION:  VITAL SIGNS:  Blood pressure:  137/92.  Pulse:  89.  Respirations:  16.  O2 sat 99% on room air.  EXTREMITIES:  Left hand has minimal edema.  No erythema.  Hypersensitivity to touch.  She is able to identify fingers that are  touched with light touch.  She has 4 out of 5 strength in the left upper  extremity secondary to pain inhibition and 5 out of 5 on the right side.  Mood and affect are appropriate.   IMPRESSION:  Reflex sympathetic dystrophy left upper extremity.   PLAN:  1. We will continue Topamax 200 mg a day.  2. Continue Cymbalta 90 mg a day.  3. We will give her 20 tablets of the Avinza 30 mg today along with 30      tablets of clonidine 0.1 b.i.d. which should help with some      withdrawal type symptoms.  We will try to discontinue these next      month or if this is not possible, just to switch her to either      Kadian 20 mg per day or MS Contin 15 mg per day.      [name], M.D.  Electronically Signed     AEK/MedQ  D:  01/17/2007 10:38:33  T:  01/17/2007 10:58:29  Job #:  [id number]   cc:   Raiford Noble, MD   Orie Rout  Fax: [phone]

## 2011-03-05 NOTE — Assessment & Plan Note (Signed)
DATE OF LAST VISIT:  Feb 25, 2006.   Since last visit, we restarted her on Zanaflex 2 mg t.i.d. plus 4 q.h.s.,  continued on her Cymbalta 13m a day, and continued on Avinza 60 per day.  She would like to continue weaning her narcotic analgesics.  She, in  addition, [facility] AEarley Favor and uses  Topamax 50 b.i.d. for her migraine headaches.  She is wondering whether she  can go up on this because of some of migraine frequency has been going up.   Her pain level last visit was 6/10.  She feels like she has had some flare-  ups of her pain in her arms and legs, including some discoloration of her  legs, but overall her pain is about 4 or 5/10.  Her sleep is poor.  She has  had some anxiety, some numbness and tingling, but these are not new.   PHYSICAL EXAMINATION:  VITAL SIGNS:  Blood pressure is 131/68, pulse 91,  respiratory rate 16, O2 saturation 96% on room air.  GENERAL:  In no acute distress.  Mood and affect appropriate.  EXTREMITIES:  Decreased left grip due to sensitivity to touch.  She has 1+  posterior tibial pulses and 2+ pedal pulses bilaterally.  There is mild  erythema in the left toes.   IMPRESSION:  Reflex sympathetic dystrophy, left upper extremity.  She has  some lower extremity symptoms, as well.   PLAN:  Will reduce Avinza to 60 on odd days and 30 on even days.  Continue  Zanaflex.  Continue Cymbalta.  See if Dr. AEarley Favormight be comfortable  increasing her Topamax.  I think this might have some beneficial effect in  terms of her RSD, as well.      ACharlett Blake M.D.  Electronically Signed     AEK/MedQ  D:  03/22/2006 12:12:09  T:  03/22/2006 23:31:41  Job #:  [id number]  cc:   JHarriette Bouillon AEarley Favor M.D.  Fax: [phone]

## 2011-03-05 NOTE — Group Therapy Note (Signed)
MEDICAL RECORD NUMBER:  [id number].   REASON FOR CONSULTATION:  Chronic pain due to RSD.   [name] is a 45 year old female who sprained her wrist at work in [place] while living in [place]. She states that the RSD started in the left  upper extremity, and she states that it spread to the rest of her body.  She states the majority of her pain sensitivity, however, is in the left  upper extremity. She has had bier blocks. She has had stellate ganglion  blocks, lidocaine drips, and trigger point injections in the past. She had  been managed with rather high dose Dilaudid with 4 mg tablets 1 to 2 p.o.  q.4h., but this was reduced. She also was weaned off of Klonopin, initially  was taking at least three tablets a day. She has been on Zanaflex for pain  as well. Has never tried a long-acting agent. Has used trazodone 450 mg  q.h.s. for sleep, but this is really not working. She has had allergy to  Valium, Demerol, and Tylox.   Pain averaging 5/10 and right now 6/10. Pain with general activity 7/10,  relationships with other people interferes at 10/10, enjoyment of life 7/10.  Sleep is poor. Relief from medications is good, about 70%. She can walk 20  to 30 minutes a day and tries to do this regularly. She has been in  consistent physical therapy for years, authorized by her workman's  compensation. She had twins two and a half years ago and cares for them. She  states that her level of activity has increased since having children. She  still needs some help from her husband with shopping and household duties.  Complains of numbness, tingling, sometimes trouble walking, sometimes  spasms, bladder control problems. She has had some weight gain, night  sweats, skin rash, constipation, limb swelling. She has asthma causing  wheezing and coughing at times, also with allergies.   Her workup including bone scan, x-ray, CT, MRI, and nerve studies was about  10 or 11 years ago, but I do not  have any record of this.   PAST MEDICAL HISTORY:  As noted above, mainly asthma. Acid reflux.   SOCIAL HISTORY:  As noted above. Twin 77-1/2-year-old children. Lives with  husband. Disabled since December of 1993.   FAMILY HISTORY:  Heart disease, alcohol abuse, high blood pressure.   PHYSICAL EXAMINATION:  VITAL SIGNS:  Blood pressure 128/84, pulse 102,  respiratory rate 18, O2 saturation 97% on room air.  GENERAL:  No acute distress. Mood and affect appropriate.   Back:  No tenderness to palpation. She has full spine range of motion. She  has full upper extremity and lower extremity range of motion. She has  hypersensitivity to touch with left upper extremity from the shoulder down  to the hand with mild altered sensation to pinprick. Right upper extremity  has no pain with palpation nor does the lower extremity. She has good  internal and external rotation of bilateral hips. She has good neck range of  motion without pain.   Exam was performed in the presence of Tina B. L.P.N.   IMPRESSION:  1.  Reflex sympathetic dystrophy causing left upper extremity pain. I am      less impressed with the symptoms in the other extremity and question      whether she has whole body RSD. As I discussed with her, I do not      think chronic PT  is really needed. She has achieved a good functional      status and really is no different off the therapy for three months      versus on. She has never tried pool therapy, and she is starting that      right now, and I think that is a reasonably thing to get into; however,      I think she can be transitioned to a community exercise program in this      regard.  2.  In terms of medications, she is taking approximately 30 mg of Dilaudid      per day. I think that she can be managed well or better with a once a      day preparation such as Avinza 90 mg a day.  3.  I also think as an adjunct agent we should start Cymbalta 30 mg p.o.      q.d. Have had  some good success with this in RSD patients although not      specifically indicated. Would wean her off the trazodone down to 150 mg      a night prior to starting the Cymbalta and then discontinue the      trazodone after one week of being on the Cymbalta.  4.  We will get urine drug screen. If negative, can  prescribe for her. If      positive for illicit substances, the patient can only have a taper off      of medication at that time or followup with referring physician. The      patient assures me that she does not use any illicit substances.  5.  I will see her back in three to four weeks. She will come back to pick      up her prescription assuming clean UDS.       AEK/MedQ  D:  04/02/2005 16:56:24  T:  04/03/2005 10:36:20  Job #:  [id number]

## 2011-03-05 NOTE — Assessment & Plan Note (Signed)
The patient returns today, last seen by me January 28, 2006.  We increased her  Cymbalta to 90 mg.  We continued Avinza at 60 mg per day.  She has been  starting to wear her splint for left upper extremity.  She has had overall  some exacerbation of her pain in bilateral feet and legs, as well as left  arm.  She has had some muscle spasms in her calves.  She has stopped using  Zanaflex during the day, only takes it at night.   The pain level is 6/10, interference score is 6/10, she can walk 20 to 30  minutes at a time.  She drives, she climbs steps.   REVIEW OF SYSTEMS:  She has no suicidal ideation.  See 14-point review.   PHYSICAL EXAMINATION:  VITAL SIGNS:  Blood pressure 127/81, pulse 91,  respirations 16, O2 saturation 97% on room air.  GENERAL:  Alert and oriented x3.  Mood and affect was bright.  Eye gaze is  normal.  EXTREMITIES:  She has decreased grip in the left hand secondary to pain.  She has no discoloration of the upper extremities.  She has mild erythema at  the toes, but no swelling.  She has 1+ pedal pulse and 2+ right.  She has  good strength in bilateral lower extremities.  She has no back pain to  palpation.   IMPRESSION:  Reflex sympathetic dystrophy, left upper extremity, chronic.  Questionable left bilateral lower extremities.   PLAN:  1.  Continue Avinza 60 mg per day.  2.  Restart on Zanaflex, given her muscle spasm pain, 2 mg t.i.d. plus 4 mg      nightly.  3.  Continue Cymbalta 90 mg per day.   I will see her back in one month and hopefully reduce her Avinza to 30 mg at  that time.      [name], M.D.  Electronically Signed     AEK/MedQ  D:  02/25/2006 13:40:49  T:  02/26/2006 15:58:36  Job #:  [id number]

## 2011-03-05 NOTE — Assessment & Plan Note (Signed)
History of reflex sympathetic dystrophy with recent hysterectomy.  Despite  this, we have been able to wean her down on her Avinza.  She is just down on  30 mg per day.  Other pain medications include Cymbalta 90 mg per day,  Zanaflex 4 mg 1/2 tablet three times daily and one at bedtime, and Lidoderm  patch.  In addition, she is on Topamax 150 mg at bedtime.   Current pain level 7, averaging 4, for 20 minutes at a time, needs  assistance with certain meal prep and household duties.   SOCIAL HISTORY:  Homemaker.  Has twin 54-year-old boys.  Married.  Does not  work outside the home.   VITAL SIGNS:  Blood pressure 137/84, pulse 65, respiratory rate 15, oxygen  saturations 95% room air.  GENERAL:  In no acute distress.  Mood and affect appropriate.  She has  hypersensitivity touch, left upper extremity.  4/5 strength due to pain  inhibition, left upper.  Right upper is 5.  Lower extremities are 5.  Gait  is without evidence of toe dragging, knee instability.  Mental status is  clear, alert.   IMPRESSION:  Reflex sympathetic dystrophy primarily affecting left upper  extremity.   PLAN:  1. Continue 30 mg dose of Avinza.  2. Continue Zanaflex as noted above.  3. Continue Cymbalta.  4. Continue Topamax.  This is helping with her  reflex sympathetic      dystrophy pain even though being prescribed for headaches. I  was able      to discuss with the patient I would be prescribing it if Dr. Earley Favor      would not and the patient would prefer to get the prescription through      this office which I am comfortable doing.      [name], M.D.  Electronically Signed     AEK/MedQ  D:  08/15/2006 13:26:50  T:  08/16/2006 07:24:42  Job #:  [id number]   cc:   Harriette Bouillon. Earley Favor, M.D.  Fax: [phone]

## 2011-03-05 NOTE — Assessment & Plan Note (Signed)
A 45 year old female with reflex dystrophy primarily left wrist and hand  last seen by me on 04/2007.   The patient has been switched from Kadian to MS Contin 15 mg only once a  day.  She takes this in the afternoon. She states that she her sleep is  rather poor waking up with some pain but also has other sleep problems  besides.   We also increased her Topamax to 50 mg in the morning and continuing at  200 in the evening last visit. She has had no new medical complications  since I last saw her.  She has an overall goal of reducing narcotic  analgesics to 0 and minimizing other medications if possible.   CURRENT MEDICATIONS:  Include Cymbalta 90 mg per day, MS Contin 15 q.  daily, Topamax 50 q. a.m. and 200 q.p.m.   PHYSICAL EXAMINATION:  VITALS:  Blood pressure 149/79, pulse 87,  respirations 18, O2 sat 99% on room air.  GENERAL:  No acute distress.  Mood and affect appropriate.  EXTREMITIES: Left hand had some hyperhidrosis also allodynia and  hyperalgesia.  Grip biceps, triceps and deltoid are all greater than 4  but this is mainly due to pain inhibition.  Right side is 5/5.  Her gait  is normal.  She is able to heel walk, toe walk.  Range of motion is full  bilateral for lower extremities.   IMPRESSION:  Reflex sympathetic dystrophy left upper extremity.   PLAN:  1. Continue Topamax 50 q. a.m. and 200 q.p.m.  2. Continue Cymbalta 90 mg q. daily.  3. Continue MS Contin 15 mg q. daily and cut it down to 30 tablets per      month.  She had the option to take up to 2 tablets a day and had 40      tablets last month but still has 7 left over.   I will see her back in one month to see how she does with the reduction  in her MS Contin monthly tablets and consider dropping her Cymbalta to  60 mg.      [name], M.D.  Electronically Signed     AEK/MedQ  D:  05/11/2007 16:33:06  T:  05/12/2007 07:58:16  Job #:  [id number]   cc:   Comer Locket, MD  [place], [place]   [id number]

## 2011-03-05 NOTE — Assessment & Plan Note (Signed)
Thursday, September 15, 2006:   HISTORY:  Reflex sympathetic dystrophy with primarily left upper  extremity pain, but occasionally involving right upper extremity and  lower extremity at the feet. We have been able to wean down on her  Avinza from 90-30 mg. She did have one flare-up last weekend where she  had severe bilateral hand pain. Did not note new activities, did not  note missing any medications as scheduled.   MEDICATIONS:  Include Cymbalta 90 mg per day, Zanaflex 2 mg t.i.d. and 4  at bedtime, Lidoderm patch, Topamax 150 mg q.h.s. for her headaches.   PHYSICAL EXAMINATION:  Blood pressure 141/83, pulse 92, respiratory rate  16, O2 995 on room air.   REVIEW OF SYSTEMS:  Please see health and history form for review of  systems.   She has allodynia, left upper extremity, 4/5 strength due to pain  inhibition in left upper, right upper is 5/5 without allodynia. Lower  extremities without allodynia. Gait is without toe drag, knee  instability or antalgia.   IMPRESSION:  Reflex sympathetic dystrophy affecting primarily left upper  extremity.   PLAN:  1. Continue Avinza 30 mg q. daily.  2. Continue Zanaflex.  3. Continue Cymbalta 90 mg q. Daily.  4. Continue Topamax.  5. I have given her a Medrol Dosepak prescription should she have      another flare-up where she feels her hands are swollen and more      painful. She is stopping the ibuprofen at that time.   I will see her back in two months and a nursing visit in one month.      [name], M.D.  Electronically Signed     AEK/MedQ  D:  09/15/2006 13:27:21  T:  09/15/2006 15:08:46  Job #:  [id number]   cc:   Harriette Bouillon. Earley Favor, M.D.  Fax: [phone]

## 2011-03-05 NOTE — Assessment & Plan Note (Signed)
DATE OF LAST VISIT:  March 22, 2006.   Reduced Avinza to 60 on odd days, 30 on even days.  Continued Zanaflex.  Continued Cymbalta.   The patient is planning to have a hysterectomy performed sometime in the  next month or so.  We did discuss potential impacts pain-wise of her RSD.  I  do not think there would be any direct relationship on her RSD activity from  her surgery, particularly intraabdominal or pelvic surgery.  Send a copy to  Dr. Gala Romney, [place], [place], [place], [place].   She has had some pain in the arch of her left greater than right foot,  mainly with walking.   Her average pain is 4 out of 10.  The pain is improved from a score of 8 out  of 10.  Sleep is poor.  Walks 20-30 minutes a day at a time for exercise.  Climbs steps.  She drives.  Denies anxiety, spasm, tingling, or numbness.  She has had some bladder problems and has been diagnosed with uterine  prolapse.   PHYSICAL EXAMINATION:  VITAL SIGNS:  Blood pressure 147/81, pulse 97,  respirations 15, O2 sat 99% on room air.  EXTREMITIES:  Show no evidence of edema.  She has some tenderness to  palpation in the plantar aspect of her midfoot.  No pain over the  metatarsals.  No pain over the calcaneus.  No pain in the Achilles area.  She is tight in the Achilles and also tight in the toe flexors of the left  greater than right lower extremity.  Her gait is without any toe drag or  knee instability.   IMPRESSION:  Reflex sympathetic dystrophy, left upper extremity and some  lower extremity symptoms as well.  Overall tolerating reduction of the  Avinza dosage.   PLAN:  1.  Continue Avinza 60 alternating with 30 mg.  2.  Continue Zanaflex.  3.  Continue Cymbalta.  4.  She may benefit from an increased dose of Topamax from her other pain      standpoint, and we will defer to Dr. Earley Favor in regards to her migraines      as this is what it is primarily prescribed for.      [name], M.D.  Electronically Signed     AEK/MedQ  D:  04/21/2006 16:45:57  T:  04/21/2006 18:17:32  Job #:  [id number]   cc:   Harriette Bouillon. Earley Favor, M.D.  Fax: [phone]   Guss Bunde, M.D.

## 2011-03-05 NOTE — Assessment & Plan Note (Signed)
DATE OF FOLLOWUP VISIT:  July 05, 2005.   HISTORY:  A 45 year old female with left upper extremity RSD with some  generalization.  This symptoms are in the left lower and to a lesser extent  the right lower and the least at right upper.  She is overall improved on  Avinza 90 mg p.o. a day.  She has been on Cymbalta for about two months.  She initially had profuse sweating, but this has improved and now it is more  intermittent.  She has had some irregularity of her menstrual periods.   Her functionality is unchanged.  She is able to take care of her young  children.  She drives,  she can walk 20 to 30 minutes at a time, climbs  steps but does have some difficulties when she has migraines.   REVIEW OF SYSTEMS:  No suicidal thoughts.  She has intermittent shortness of  breath.  She does take multiple medications for her asthma.  She has had  some night sweats, some urine retention, some constipation, as well as  swelling in her limbs.   PHYSICAL EXAMINATION:  VITAL SIGNS:  Blood pressure 138/85, pulse 100,  respirations 16, O2 SAT 97%.  GENERAL:  In no acute distress.  Mood and affect appropriate.  SKIN:  No dystrophic changes in the hands and feet.   Does have a mild tremor, left greater than right upper extremity.  She has  mild intrinsic atrophy in the left upper extremity, hypersensitivity to  touch with light pressure, left upper extremity.   No evidence of contracture.  Gait is normal.   Reflexes good.   IMPRESSION:  1.  Dystrophy, left upper extremity, chronic.  2.  This was unrelated to [facility] Comp injury settled.  She has last      worked about 10 years ago.  3.  History of migraines.  I do not think they are related to a Workman's      Comp injury.  I think she could benefit from additional assessment and      management and has had some Imitrex.  4.  Sweats initially thought this was all due to Cymbalta.  She may have      some perimenopausal symptoms.  She is  new to [facility] and does not      have a local gynecologist.  Will refer her.      [name], M.D.  Electronically Signed     AEK/MedQ  D:  07/05/2005 13:18:43  T:  07/05/2005 22:10:22  Job #:  [id number]   cc:   Sharyn Lull L. Helane Rima, M.D.  Fax: [phone]  Fax: [phone]   Dr. Lelon Huh ???  [place]

## 2011-03-05 NOTE — Assessment & Plan Note (Signed)
Since I last saw her in July of 2007, she has seen nursing for cell counts  and monitoring medication compliance.  Interval history is positive for  hysterectomy with perioperative complications of what sounds like bladder  perforation which was managed with Foley catheter and now the Foley catheter  has been removed.  She had an appendectomy at the same time.   She has had a flare up of her pain overall with the left upper extremity as  well as bilateral lower extremities.  She has had some lower extremity  swelling related to intra-pelvic swelling from the surgery but this has  improved with leg elevation.  She has stopped her Avinza while taking the  oxycodone postoperatively and has had some exacerbation of pain.  She is up  to a 9/10 level and increasing with walking.  She continues to climb steps,  she drives, she takes care of her children but overall is feeling achy all  over.   PHYSICAL EXAMINATION:  Her blood pressure is running 149/102, pulse 100,  respiratory rate 18.  She was in mild discomfort.  She is hypersensitive to  touch bilateral lower extremities as well as the left upper extremity.  She  has no lower extremity or upper extremity swelling or erythema.   Range of motion and strength in lower extremities is good.   IMPRESSION:  1. Reflex sympathetic dystrophy, left upper extremity, lower extremity      symptoms as well.  Resume Avinza at 30 mg a day.  She is able to reduce      her dosage to that point before her surgery but she can take the      oxycodone on top of this.  2. Continue Cymbalta using this for pain related to her reflex sympathetic      dystrophy and not primarily as an antidepressant.  3. Continue Zanaflex.  4. I will see her back in one month and will also write some ibuprofen 800      mg three times daily.  She can use that interchangeably with the Oxy-      IR.      [name], M.D.  Electronically Signed     AEK/MedQ  D:   07/12/2006 14:03:07  T:  07/14/2006 11:54:28  Job #:  [id number]   cc:   Guss Bunde, M.D.   Harriette Bouillon. Earley Favor, M.D.  Fax: [phone]

## 2011-03-05 NOTE — Op Note (Signed)
[name], [name] NO.:  [id number]   MEDICAL RECORD NO.:  [id number]          PATIENT TYPE:  OIB   LOCATION:  9320                          FACILITY:  [place]   PHYSICIAN:  Willey Blade, MD  DATE OF BIRTH:  1966/02/09   DATE OF PROCEDURE:  06/28/2006  DATE OF DISCHARGE:                                 OPERATIVE REPORT   PREOPERATIVE DIAGNOSES:  1. Genuine urinary stress incontinence.  2. Adenomyosis.  3. Menometrorrhagia.  4. Chronic appendicitis.  5. Second degree cystocele.   POSTOPERATIVE DIAGNOSES:  1. Genuine urinary stress incontinence.  2. Adenomyosis.  3. Menometrorrhagia.  4. Chronic appendicitis.  5. Inadvertant cystotomy.  6. Second degree cystocele.   PROCEDURES:  1. Laparoscopic-assisted vaginal hysterectomy with preservation of both      tubes and ovaries.  2. Laparoscopic appendectomy.  3. Tension-free vaginal tape procedure.  4. Cystoscopy.  5. Repair of cystotomy.  6. Anterior colporrhaphy.   SURGEON:  Guss Bunde, M.D.   ASSISTANT:  Willey Blade, MD.   ESTIMATED BLOOD LOSS:  125 mL.   COMPLICATIONS:  Cystotomy.   SPECIMEN:  Uterus and cervix to pathology   ANESTHESIA:  General.   OPERATIVE FINDINGS:  Uterus was globular about 6-8 weeks in size.  Both  tubes and ovaries appeared normal.  Appendix was retrocecal with  significantly thickened mesoappendix consistent with chronic appendicitis.  The patient had a significantly adherent area of the left wall of the  bladder to the left anterior vaginal epithelium resulting in a 1 cm  cystotomy which was appropriately repaired as described below.  Indigo  carmine insufflation demonstrated no evidence of injury or violation of the  ureters and the bladder was closed appropriately without evidence of leakage  of infant formula after filling the bladder to 250 mL.   OPERATIVE PROCEDURE:  The patient prepped and draped in usual fashion and  placed in the lithotomy  position.  Betadine solution used for antiseptic and  the patient was catheterized prior to the procedure.  After adequate general  anesthesia, a tenaculum was placed in the anterior lip of the cervix and a  Pelosi uterine manipulator placed in the endocervical canal.  Afterwards a  vertical infraumbilical incision was made and the Veress needle  placed in  the abdomen.  Opening and closing pressures were 10 and 15 mmHg.  Needle  released, trocar placed in the same incision.  Laparoscope placed through  the trocar sleeve.  Afterwards two 5 mm ports were made under direct  visualization in left lower quadrant and left hypogastric regions,  respectively.  Following this, the mesoappendix was identified, bipolar  cauterized and cut to the base.  Afterwards, 2-0 Vicryl loop ties were  placed at the base and another one about 7-8 mm above the first two.  Appendix cut above the first two ties, removed with  Endopouch bag through  the trocar.  Copious irrigation with lactated Ringer's at the base followed.  No active bleeding was noted.  At this point, attention was directed to the  hysterectomy proper.   The utero-ovarian ligaments were bipolar  cauterized including the round  ligament and proximal portions of the tube.  These were all cut.  Bleeding  points hemostatically checked and attention was then directed to the vaginal  portion of the hysterectomy.  Marcaine with epinephrine was injected  circumferentially around the cervix.  Following this, the vaginal epithelium  was incised about 1 cm from the external os.  The peritoneal reflections  were identified and opened without injury to their respective organs.  At  this point, uterosacral ligament, cardinal ligament complexes clamped, cut  and ligated with 0 Vicryl suture and affixed to their respective vaginal  sidewalls.  Following this, the uterine vasculature was clamped, cut and  ligated with 0 Vicryl suture.  This extended to the broad  ligaments.  At  this point, uterus and cervix were removed, sent to pathology.  Bleeding  points hemostatically checked.  Irrigation, blood clots removed.  Closure of  the cuff in one layer with 0 Vicryl running interlocking suture.  At this  point, reinspection of the pelvis with the laparoscope revealed no active  bleeding.  Closure of the fascia with 0 Vicryl suture for the 10 mm site and  4-0 Vicryl for subcuticular skin closures.  Attention was then directed to  the vaginal portion of the procedure including the anterior colporrhaphy,  repair of cystotomy and the TVP with cystoscopy.   The anterior vaginal epithelium was incised in the midline and the  pubovesical cervical fascia was dissected off the vaginal epithelium on the  left side during said dissection.  Small cystotomy was inadvertently made  due to significant adherence and this was closed with a two-layer closure of  3-0 Vicryl running interlocking suture.  Bladder was filled to 250 mL  demonstrating no leakage from repair site.  Afterwards the bladder was  emptied.  Infant formula was used for bladder filling solution.  Afterwards  the space of Retzius was identified and utilizing a bottom up advantage TVT  technique, trocars entered space of Retzius and emanated 1-2 cm lateral from  the midline.  At this point, cystoscopy was performed, the repair was leak-  proof and no evidence of violation of the tape at the dome, lateral walls,  trigone or the urethra.  Indigo carmine was utilized intravenously and both  ureters spilled vigorously.  At this point, the bladder was emptied and a  standard anterior colporrhaphy using 0 Monocryl was then utilized.  The TVP  tape was then appropriately tensioned and the plastic sheaths removed.  Tape  was anchored to avoid posterior migration with 3-0 Vicryl interrupted suture on either side.  At this point, the tape was cut beneath the skin.  Extra  anterior vaginal epithelium was trimmed  and closed with 0 Monocryl running  interlocking suture.  Tensioning was performed on the tape with 250 mL of  normal saline in the bladder which was released afterwards.  The skin  incisions where the tape had emanated were closed with Dermabond.  The  patient tolerated the procedure well and returned to post anesthesia  recovery room in excellent condition.     Willey Blade, MD  Electronically Signed    SHB/MEDQ  D:  06/28/2006  T:  06/29/2006  Job:  [id number]

## 2011-03-05 NOTE — Discharge Summary (Signed)
NAME:  [name], [name] NO.:  [id number]   MEDICAL RECORD NO.:  [id number]          PATIENT TYPE:  OIB   LOCATION:  9320                          FACILITY:  [facility]   PHYSICIAN:  Guss Bunde, M.D. DATE OF BIRTH:  Mar 06, 1966   DATE OF ADMISSION:  06/28/2006  DATE OF DISCHARGE:  06/30/2006                                 DISCHARGE SUMMARY   The patient is a 45 year old female who underwent an LAVH laparoscopic  appendectomy, sling, anterior repair on 06/28/2006 secondary to genuine  stress incontinence, adenomyosis, menometrorrhagia, chronic appendicitis,  and second-degree cystocele.  Details of the operation are in the operative  report.  She did have a cystotomy that was repaired at the time of the  anterior colporrhaphy.  Again, details of this were in the operative report.  Postoperatively the patient had some postoperative pain that was not well  controlled with oral medication on June 29, 2006.  She stayed until  June 30, 2006.  Due to the cystotomy she was discontinued with the  Foley.  It was to be kept in situ for 10 days.  She also was to be kept on  antibiotics for 10 days.  On discharge day she was ambulating, having flatus  and tolerating solid oral foods.   POSTOPERATIVE DISCHARGE MEDICATIONS:  1. Percocet 1-2 tablets q.4-6 h.  2. Augmentin 500 mg b.i.d.   RESUME ALL PREOPERATIVE MEDICATIONS WHICH INCLUDE:  1. Prevacid.  2. Singulair.  3. Cymbalta.  4. Xanax.  5. Zyrtec.  6. Rozerem.  7. Albuterol.  8. Advair.  9. The patient is NOT to continue Prozac.   DIET:  No restrictions.   ACTIVITY:  1. Increase activity slowly.  2. May shower or bathe.  3. May walk steps if tolerated.  4  No sexual activity for 6 weeks.  1. No driving for 2 weeks.  2. No heavy lifting for 6 weeks.   WOUND CARE:  Keep wound clean and dry.   DISCHARGE INSTRUCTIONS:  1. Foley catheter in bladder for 10 days.  2. Postoperative appointment is 10 days to  the [facility] and 6      weeks to the [facility].           ______________________________  Guss Bunde, M.D.     KHL/MEDQ  D:  08/07/2006  T:  08/08/2006  Job:  [id number]

## 2011-03-05 NOTE — Assessment & Plan Note (Signed)
NAME:  [name], [name] NO.:  [id number]   MEDICAL RECORD NO.:  [id number]          PATIENT TYPE:  POB   LOCATION:  [place] [facility] [place]:  [facility]   PHYSICIAN:  [name], MD      DATE OF BIRTH:  1966/03/12   DATE OF SERVICE:                                    CLINIC NOTE   The patient and I have had a long conversation today on the phone about the  risks, benefits, and alternatives of surgery. The patient is scheduled on  September 11 for an LAVH, appendectomy, possible anterior repair, a  suburethral sling and a possible BSO. She is 45 years old and felt like she  was going through menopause and I drew an [facility] which was 5. She does have a  family history of breast and ovarian cancer. She did go see a Engineer, mining and she was negative for the BRCA gene. This does lower her risk  of having familial ovarian cancer. However, she still has the risk of having  ovarian cancer but not one associated with BRCA1 and 2. The patient is torn  whether to have her ovaries out or not. I explained at age 56 that estrogen  is cardioprotective to her heart and bones and that without this she may be  at risk for increased cholesterol, heart disease and osteoporosis. However  if she had her ovaries out, she would no longer be at risk for ovarian  cancer. At this point in time, she is electing to leave her ovaries in  because she feels like her family history is stronger for heart disease than  it is for ovarian cancer and she is more worried about having heart disease.  She understands that the risks of the procedure include but are not limited  to bleeding, infection and damage to the intraabdominal organs particularly  the bowel, the bladder and the ureter. She understands that it will be a one  night stay and early ambulation and eating is a must. The patient is  scheduled September 11 at 1:30. Dr. Burke Keels will be performing the  appendectomy and sling and I  will be assisting in those parts and I will be  doing the LAVH anterior repair.           ______________________________  [name], MD     KL/MEDQ  D:  06/16/2006  T:  06/17/2006  Job:  [id number]

## 2011-03-05 NOTE — Assessment & Plan Note (Signed)
HISTORY:  The patient last seen by me September 21, 2005, history of reflex  sympathetic dystrophy, left upper extremity, chronic.  Interval history:  Has [facility], started  on Topamax and also had a sleep study.   Additional interval symptoms include increasing numbness, bilateral fingers.  No particular diurnal variation.  She is dropping objects with her hands.  She has some problem with grip.   No significant neck pain.   Her medications include:  1.  Avinza 90 mg q.24h.  2.  Zanaflex 4 mg q.i.d.  3.  Cymbalta 60 mg daily.  This has been increased since December and seems      to be helping her RSD symptoms.  4.  Lidoderm patch on 12, off 12, up to three patches a day.   REVIEW OF SYSTEMS:  Night sweats, poor appetite, limb swelling, spasms,  anxiety.  No numbness and tingling is noted.   SOCIAL HISTORY:  Married.  Caring for children.  No work outside the home.   PHYSICAL EXAMINATION:  VITAL SIGNS:  Blood pressure 131/79, pulse 73, O2  saturation 96% on room air.  GENERAL:  In no acute distress, mood and affect appropriate.  She has  positive Phalen's in the fingertips to the left hand, negative Tinel's over  the elbow or wrist area.  She has 4/5 strength in the left upper extremity  but guarding, and this is chronic related to her RSD.  No skin color  changes, vasomotor changes, etc.  The right upper extremity has normal  strength.  She has normal deep tendon reflexes.  NECK:  Full range of motion without pain.   IMPRESSION:  1.  Reflex sympathetic dystrophy of the left upper extremity, chronic.      Responding well to Cymbalta.  Will reduce the Avinza to 60 mg p.o.      daily.  Patient agrees with this.  2.  Probable carpal tunnel syndrome, left greater than right upper      extremity.  Will have her wear wrist splints at night and if this is not      helpful, do 24 hour a day wrist splinting.  If not better in a month,  I      will do nerve conduction studies here in the office and consider oral      corticosteroids.  3.  Headache management and sleep diagnostic workup treatment per Dr.      Mayra Reel office.   I will see the patient back in one month.      [name], M.D.  Electronically Signed     AEK/MedQ  D:  12/28/2005 13:22:25  T:  12/29/2005 15:02:57  Job #:  [id number]   cc:   Harriette Bouillon. Earley Favor, M.D.  Fax: [phone]

## 2011-03-05 NOTE — Assessment & Plan Note (Signed)
REASON FOR VISIT:  Reflex sympathetic dystrophy primarily left upper  extremity.   INTERVAL HISTORY:  Has had a sleep study at Dr. Mayra Reel office in which  she reportedly slept about 37 minutes.  She states that she took Ambien CR  12.5 prior to the study.  She is set up with a home O2 saturation monitor  for night time usage.   She feels that since wearing her wrist splints, her finger numbness has  improved.   We have dropped her Avinza from 90 to 60 mg with no rebound in her pain  level.  She continues with Lidoderm patches and Cymbalta 60 mg a day and  Zanaflex 4 mg q.i.d.   REVIEW OF SYSTEMS:  Negative.   PHYSICAL EXAMINATION:  GENERAL APPEARANCE:  No acute distress.  VITAL SIGNS:  Blood pressure is 139/80, pulse 105, respirations 16, O2  saturation 99% on room air.  NEUROLOGIC:  Mood and affect appropriate.  She has hypersensitivity to touch  left upper extremity and reduced grip because of this.   She has no skin dystrophic changes, no nail dystrophic changes.  Full range  of motion bilateral upper and lower extremities.  She has minimal  hypersensitivity in her right hand and bilateral feet.   IMPRESSION:  Reflex sympathetic dystrophy left upper extremity, chronic.   PLAN:  1.  Will increase her Cymbalta to 90 mg.  2.  Discontinue Avinza at 60 mg but plan on reduction to 30 mg next month      once Cymbalta is equilibrated.  3.  Topamax is being prescribed per Dr. Earley Favor for headaches.  4.  Continue splint usage for bilateral hands, suspected carpal tunnel.      [name], M.D.  Electronically Signed     AEK/MedQ  D:  01/28/2006 11:49:58  T:  01/28/2006 13:50:46  Job #:  [id number]   cc:   Harriette Bouillon. Earley Favor, M.D.  Fax: [phone]   [place] Helane Rima, M.D.  Fax: [phone]

## 2011-03-05 NOTE — Assessment & Plan Note (Signed)
Last visit 09/15/2006.  Patient with reflex sympathetic dystrophy.   [name] returns today.  I last saw here 09/15/2006 for reflex  sympathetic dystrophy primary left upper extremity, occasionally  involving right upper extremity and lower extremity.  She has had no  lower extremity swelling.  She has had an episode where she had left  upper extremity increased pain and swelling, and she used Medrol-Dosepak  with improvement.   Her other medications include Cymbalta 90 mg a day, Zanaflex 2 mg t.i.d.  and 4 mg q. h.s., Lidoderm patch on 12 off 12, and Topamax which was  recently increased by her headache specialist to 200 mg q. h.s.   She has had no new medical complications in the interval time.  She does  continue on Avinza  30 mg a day, and would eventually like to get off  this, but wants to wait till the weather warms up.   REVIEW OF SYSTEMS:  Positive for sleep problems, has a history of sleep  apnea diagnosed by sleep study.  Nausea, constipation, skin rash, night  sweats, numbness or tremor, tingling, spasms, anxiety, denies any  suicidal thoughts.  Functionally needs assist with meal prep, shopping,  house work duties.   PHYSICAL EXAMINATION:  VITAL SIGNS:  Blood pressure 131/91, pulse 79,  respiratory rate 16.  O2 sat 98% room air.  GENERAL:  Orientation times 3.  Affect is bright and alert.  Gait is  normal.  She is able to toe walk, heel walk.  SKIN:  No evidence of dystrophic changes in the nails or in the skin.  No dyshydrosis.  No erythema.  She does have mild-to-moderate allodynia  in the left upper extremity, but not in the right upper or lower  extremities.  MUSCULOSKELETAL:  Her deep tendon reflexes are normal.  Range of motion  is full.  Motor strength is full except left upper extremity is 4 out of  5 and limited by pain in all muscle groups.  Range of motion is full  bilateral upper and lower extremities.   IMPRESSION:  1. Reflex sympathetic dystrophy  primarily affecting left upper      extremity.  2. Migraine headaches.   PLAN:  I will see her back in 2 months.  At that time we will consider  going to every other day on the Avinza.  Nursing visit in between now  and then.      [name], M.D.  Electronically Signed     AEK/MedQ  D:  11/21/2006 15:34:12  T:  11/21/2006 16:19:31  Job #:  [id number]   cc:   Orie Rout  Attention:  _______ Jacques Earthly, PA
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
## 2011-03-05 NOTE — Assessment & Plan Note (Signed)
Last visit with me was approximately 3 months ago, has had many nursing  visits and monitored usage of Avinza 90 mg q.24 h.  She has been on Cymbalta  30 mg p.o. q.a.m.   INTERVAL HISTORY:  Has had some more sweating in the left upper extremity  from increased pain, averaging about 4/10, but with the change in weather,  it has been up around 8/10.  She continues to drive.  She takes care of the  children.  She is not employed.  She does her home exercises as prescribed.   PHYSICAL EXAMINATION:  VITAL SIGNS:  Blood pressure 126/84, pulse 90,  respirations 16, O2 saturation 98% on room air.  GENERAL:  No acute distress.  Mood and affect appropriate.  EXTREMITIES:  Left upper extremity had some sweating in palm.  She has not  skin dystrophic changes.  Mild hypersensitivity to touch.  She grasps at  4/5. Biceps, triceps, deltoids 4/5 related to sensitivity/pain.  Her right  lower extremity strength is 4.  Right upper extremity has no pain with  palpation, no sweating, and good strength.   IMPRESSION:  Reflex sympathetic dystrophy left upper extremity, chronic.  1.  I think she would do well with increased dose of Cymbalta.  2.  Continue Avinza as baseline medication.  3.  Consider physical therapy tune up as needed if above not effective      after about another month.  4.  We will see the patient back in 1 to 2 months      [name], M.D.  Electronically Signed     AEK/MedQ  D:  09/21/2005 13:34:38  T:  09/21/2005 21:52:47  Job #:  [id number]

## 2011-03-05 NOTE — Assessment & Plan Note (Signed)
NAME:  [name], BATRA NO.:  [id number]   MEDICAL RECORD NO.:  [id number]          PATIENT TYPE:  POB   LOCATION:  [place] [facility] [place]:  [name], MD      DATE OF BIRTH:  [birth year]   DATE OF SERVICE:  06/09/2006                                    CLINIC NOTE   This patient is a 45 year old female with pelvic pressure, prolapse,  frequent menstrual cycles and urinary incontinence.  The patient also has  history of chronic appendicitis that never warrants a true appendectomy.  The patient is here for surgery consult and endometrial biopsy.  Endometrial  biopsy is performed today.  The uterus sounds to 8 cm.  The patient has an  extensive history of ovarian cancer in her family, however, she does not  carry the mutation for BRCA1 or 2.  We discussed taking her ovaries out.  She is having some menopausal symptoms.  We are drawing an [facility] today.  If  she is menopausal then we will most likely take her ovaries out.  If she is  not nearing menopause then we most likely will not, however, this may change  at the time of surgery.  We are going to take her uterus out given that  there is prolapse stage II.  We also are planning to put in a sling.  We  will do a diagnostic laparoscopy and look at the chronic right sided pain  and Dr. Burke Keels has appendectomy privileges and will take out her appendix  if possible.  He is also the one that has the privileges for __________.  The patient needs to have the surgery done before the end of the year when  her insurance runs out.  We will try to accommodate this.  Of note, her Pap  smear is normal and her mammogram is normal.  The risks of the procedure  include, but are not limited to, bleeding infection, damage to intra-  abdominal organs, bowel, bladder, major blood vessels and ureters.  There is  always the risk of blood clot after pelvic surgery.  The patient understands  these risks.  We  will decide what to do with the ovaries on the day of her  surgery, once the [facility] returns.           ______________________________  [name], MD     KL/MEDQ  D:  06/09/2006  T:  06/09/2006  Job:  [id number]

## 2011-03-05 NOTE — Assessment & Plan Note (Signed)
A 45 year old female with upper extremity RSD.  Left upper has had some  generalization of symptoms but still mainly in the left upper extremity,  left shoulder and neck area.  She more recently has had concurrent  migraines.  Denies any excessive medication overuse, does not take ibuprofen  even on a daily basis.  Denies any regular acetaminophen usage other than  she did try some Tylenol Sinus.   In terms of her RSD-type pain, she has had overall improvement with her  Avinza 90 mg p.o. daily.  Her Cymbalta has been on board for approximately  one month.  She has had some initial problems with sweating, but these have  improved with time and they do increase when she uses Imitrex, which she  used x1 today.  She continues to use Zanaflex 4 mg p.o. q.i.d.  She is now  completely off her trazodone, which was at one time 450 mg per day.  She  does some water therapy.   She remains functional in terms of being able to care for her young children  as well as do her housework, although with the migraine she states that she  had to put a video in for about an hour while she lay down yesterday.  She  is also off her Dilaudid now.   REVIEW OF SYSTEMS:  No suicidal thoughts.  Some constipation.   SOCIAL HISTORY:  Married.  Does not work outside the home.   PHYSICAL EXAMINATION:  GENERAL:  In no acute distress, mood and affect  appropriate.  MUSCULOSKELETAL/NEUROLOGIC:  She has mild tenderness to palpation in the  neck, left side greater than right side and paraspinals.  She has full range  of motion, bilateral upper and lower extremities.  She has hypersensitivity  to touch in the left upper extremity with light pressure.  She has no  dysvascular changes or female atrophy in the left upper extremity.  She has no  evidence of contracture.  Her gait is normal.   IMPRESSION:  1.  Reflex sympathetic dystrophy, left upper extremity, chronic.  2.  History of migraines, which I do not think are  related to her Workmen's      Comp injury, which was sustained about 10 years ago.  Nevertheless, I      think they are disabling and she may benefit from additional assessment      and management of this issue.   PLAN:  1.  Will continue Avinza 90 mg p.o. daily.  2.  Trial of Lidoderm patches that she can use on the left side of the neck      and upper extremities, which I think is related to her RSD-type      symptoms.  3.  Continue Zanaflex 4 mg q.i.d.  4.  Continue Cymbalta 30 mg p.o. q.24h.  I hesitate to increase dosage given      some sweating that she has even now.  5.  I also encouraged her to follow up either with her gynecologist or      primary care physician.  She states her periods have been somewhat      irregular and some of her sweating, since this may be hot flash-related      due to      menopausal symptoms.  6.  I will see her back in one month.      [name], M.D.  Electronically Signed     AEK/MedQ  D:  06/03/2005 17:08:00  T:  06/04/2005 06:34:48  Job #:  [id number]   cc:   Orie Rout  [address], [address]  [place] [address]  Fax: [phone]

## 2011-03-05 NOTE — Assessment & Plan Note (Signed)
MEDICAL RECORD NUMBER:  [id number].   DATE OF SURGERY:  May 04, 2005.   HISTORY OF PRESENT ILLNESS:  This is a 45 year old female with wrist sprain  at work in [place] while living in [place].  RSD starting in left  upper extremity and she was told that it spread through the rest of her  body.  She has had multiple invasive procedures such as Bier block, stellate  ganglion blocks.  She has had lidocaine drips and trigger point injections.  When I first saw her, we switched her from Dilaudid for a total of about 30  mg a day.  Switched her to Avinza 90 mg p.o. daily.  She had some mild  withdrawal type symptoms as well as some itching associated with the  morphine.  She managed this with some diphenhydramine.  She as weaned off  her Trazodone 450 mg p.o. q.h.s. and was converted to Cymbalta.  Pain score  currently 4, averaging 7.  Pain interference score:  General activity 7,  relationship with other people 3, general life 3 which was an improvement  from the 7-10 range prior.  She has good days and bad days still.  She  states is better falling asleep, but does not stay asleep.  She can walk 15-  30 minutes, took a 30 minute walk yesterday, climbs up steps and drives,  does aquatic exercise, is wondering about myofascial release type of  exercise as well as desensitization, but she does her own loofah rubdown  which has been helping.   Neuropsychotic:  Spasm, tingling, dizziness, night sweats, constipation,  some hesitancy.  Reviewed these all with the patient.  The majority are side  effects of Cymbalta.  The itching is more the morphine.   PHYSICAL EXAMINATION:  VITAL SIGNS:  Blood pressure 142/95, pulse 96,  respirations 16, O2 saturation 95% on room air.  GENERAL APPEARANCE:  No acute distress.  Mood and affect appropriate.  EXTREMITIES:  Left upper extremity hypersensitive to touch.  Did not want  her deep tendon reflexes tested on her left arm.  Otherwise, deep tendon  reflexes in the right arm and bilateral lower extremities are normal.  She  has minimally reduced forward flexion in the left upper extremity, lacks  about 10 degrees, otherwise, full range of motion in bilateral upper and  lower extremities.  Does have some reduced internal rotation in the left  upper extremity as well but is able to reach behind her back.  Extremities  show no evidence of redness, erythema or swelling.   IMPRESSION:  1.  Reflex sympathetic dystrophy.  I think it is really mainly localized to      the left upper extremity, chronic.  Overall, she has improved her level      of functioning on a long acting agent, and we will maintain her on      current.  Her overall goal is to reduce narcotic dosage, and I think we      can do this in a gradual fashion.  I think once her Cymbalta is fully      active and she has habituated to the side effects, she should be in a      position to reduce her Avinza from 90-60 mg a day.  2.  Continue aquatic therapy.  She is looking for more of a myofascial      release.  We can explore this once her current therapies are done, but a  lot of what she needs to do right now is self management, i.e.,      desensitization in continuing home exercises.   PLAN:  Continue current medications which consist of Zanaflex.  She is  reduced from 3-4 per day to 1-2 per day.  Will write for only 60.  Avinza 90  mg daily, Cymbalta 30 mg p.o. daily.  Stated that the Imitrex plus Cymbalta  can result in excessive sweating, but she only very occasionally uses the  Imitrex.  I will see her back in one month.       AEK/MedQ  D:  05/04/2005 11:42:47  T:  05/04/2005 12:30:30  Job #:  [id number]

## 2011-03-30 ENCOUNTER — Encounter: Payer: Worker's Compensation | Attending: Physical Medicine & Rehabilitation

## 2011-03-30 ENCOUNTER — Ambulatory Visit: Payer: Worker's Compensation | Admitting: Physical Medicine & Rehabilitation

## 2011-03-30 DIAGNOSIS — M719 Bursopathy, unspecified: Secondary | ICD-10-CM | POA: Insufficient documentation

## 2011-03-30 DIAGNOSIS — M25519 Pain in unspecified shoulder: Secondary | ICD-10-CM | POA: Insufficient documentation

## 2011-03-30 DIAGNOSIS — M67919 Unspecified disorder of synovium and tendon, unspecified shoulder: Secondary | ICD-10-CM | POA: Insufficient documentation

## 2011-03-30 DIAGNOSIS — G90519 Complex regional pain syndrome I of unspecified upper limb: Secondary | ICD-10-CM

## 2011-03-30 DIAGNOSIS — R209 Unspecified disturbances of skin sensation: Secondary | ICD-10-CM | POA: Insufficient documentation

## 2011-03-30 DIAGNOSIS — Z79899 Other long term (current) drug therapy: Secondary | ICD-10-CM | POA: Insufficient documentation

## 2011-03-31 NOTE — Assessment & Plan Note (Signed)
REASON FOR VISIT:  Pain left upper extremity.  HISTORY:  A 45 year old female with the wrist sprain work-related back in the 90s developed RSD with some secondary generalization to the left lower extremity.  She has primary complaints of left upper extremity mainly the hand, but also has some concomitant left shoulder pain which I felt was more rotator cuff syndrome, had seen orthopedics in the past for this, but has been going back to see them.  Her pain level is 7/10, but is functional with this, is taking care of not only her children, but also her nieces.  She needs help with certain shopping and household duties, but otherwise independent.  She is able to climb steps.  She is able to drive.  She has weakness, numbness, tremor, tingling, spasms, anxiety on a review of systems, but these are stable.  She has had no new medical interventions or new medicines prescribed by other doctors in the interval time.  Pain is still described mainly sharp, burning, stabbing, constant, tingling and aching.  Blood pressure 164/96, pulse 84, respirations 18 and O2 sat 95% on room air.  She was in a rush today.  CURRENT PAIN MEDICATIONS: 1. Hydrocodone 7.5/500 nightly. 2. Cymbalta 90 mg a day. 3. Topamax 100 mg q.a.m. and 200 nightly. 4. Tizanidine 2 mg b.i.d. and 4 mg nightly. 5. Lidoderm patch 5% 3 patches on 12 off 12. 6. Ultram ER 100 mg a day. 7. Tramadol 50 mg nightly. 8. Motrin 800 t.i.d.  Exam, hypersensitivity to touch left hand, decreased range of motion, finger, wrist flexors right and left hand.  Left ankle is also with decreased range of motion hypersensitivity to touch left lower extremity, although not as extreme as in the left upper extremity. Right shoulder mildly positive impingement testing.  Range of motion of the left shoulder is mildly decreased abduction and forward flexion.  IMPRESSION:  Reflex sympathetic dystrophy  upper limb.  This is what we were really  treating her for.  She also has a rotator cuff syndrome which is being treated by Orthopedics, but has been doing relatively well.  She has been doing some exercises in the pool.  I do not think any formalized therapy needed at this time.  She does have an elevated blood pressure today.  I would need to follow up with primary care on this.  I will see her back in 3 months.     [name], M.D. Electronically Signed    AEK/MedQ D:  03/30/2011 12:16:22  T:  03/31/2011 01:39:41  Job #:  [id number]

## 2011-04-27 ENCOUNTER — Ambulatory Visit (INDEPENDENT_AMBULATORY_CARE_PROVIDER_SITE_OTHER): Payer: Self-pay | Admitting: Internal Medicine

## 2011-04-27 ENCOUNTER — Encounter: Payer: Self-pay | Admitting: Internal Medicine

## 2011-04-27 VITALS — BP 140/80 | HR 115 | Temp 99.2°F | Ht 67.0 in | Wt 216.0 lb

## 2011-04-27 DIAGNOSIS — J019 Acute sinusitis, unspecified: Secondary | ICD-10-CM

## 2011-04-27 DIAGNOSIS — J45901 Unspecified asthma with (acute) exacerbation: Secondary | ICD-10-CM

## 2011-04-27 MED ORDER — PREDNISONE 20 MG PO TABS: 40.0000 mg | ORAL_TABLET | Freq: Every day | ORAL | Status: AC

## 2011-04-27 MED ORDER — AMOXICILLIN 500 MG PO TABS: 1000.0000 mg | ORAL_TABLET | Freq: Two times a day (BID) | ORAL | Status: AC

## 2011-04-27 NOTE — Assessment & Plan Note (Signed)
Mild  Will give prednisone Rx for a short course Can continue nebulizer

## 2011-04-27 NOTE — Progress Notes (Signed)
Subjective:    Patient ID: [name], female    DOB: Dec 10, 1965, 45 y.o.   MRN: [id number]  HPI "I don't feel good" Chest feels pressure on it 103 fever with teeth chattering chills Cough and secondary cough Had drenching sweat last night  Chest heaviness started 2-3 days ago Related to asthma and air quality  Headache, cough, fever yesterday Cough is dry but occ yellow mucus SOB even at rest Wheezing has been audible  Sore throat--related to cough No ear pain  Has been taking ibuprofen 800 for fever and aches Tried nebulizer--helped a little Coricidin for cough Kids have had bronchitis  Current Outpatient Prescriptions on File Prior to Visit  Medication Sig Dispense Refill  . albuterol (PROAIR HFA) 108 (90 BASE) MCG/ACT inhaler Inhale 2 puffs into the lungs every 4 (four) hours as needed.        . DULoxetine (CYMBALTA) 30 MG capsule Take 30 mg by mouth 2 (two) times daily.        Marland Kitchen esomeprazole (NEXIUM) 40 MG capsule Take 40 mg by mouth 2 (two) times daily.        Marland Kitchen loratadine (CLARITIN) 10 MG tablet Take 10 mg by mouth daily.        . metoprolol (LOPRESSOR) 25 MG tablet Take 25 mg by mouth 2 (two) times daily.        . montelukast (SINGULAIR) 10 MG tablet Take 10 mg by mouth daily.        . simvastatin (ZOCOR) 20 MG tablet Take 20 mg by mouth daily.        Marland Kitchen tiZANidine (ZANAFLEX) 4 MG tablet Take 4 mg by mouth 4 (four) times daily.        Marland Kitchen topiramate (TOPAMAX) 200 MG tablet Take 200 mg by mouth 2 (two) times daily.          Allergies  Allergen Reactions  . Diazepam     REACTION: swelling  . Meperidine Hcl     REACTION: swelling  . Oxycodone-Acetaminophen     REACTION: Hives    Past Medical History  Diagnosis Date  . Anxiety   . Asthma   . History of colonic polyps   . GERD (gastroesophageal reflux disease)   . Reflex sympathetic dystrophy   . Migraines   . Sleep apnea   . Hypertension   . Allergy   . Hyperlipidemia   . ASCVD (arteriosclerotic  cardiovascular disease)     Past Surgical History  Procedure Date  . Cesarean section   . Tonsillectomy   . Nasal septal repair   . Abdominal hysterectomy   . Appendectomy   . Bladder lift 10/18/2005  . Oophorectomy 10/18/2006    right    Family History  Problem Relation Age of Onset  . Cancer Mother     lung  . Hypertension Mother   . Cancer Father     colon  . Hypertension Sister   . Birth defects Maternal Grandmother     breast  . Birth defects Paternal Grandmother     uterine, stomach, lung    History   Social History  . Marital Status: Married    Spouse Name: N/A    Number of Children: 2  . Years of Education: N/A   Occupational History  . Disabled due to RSD    Social History Main Topics  . Smoking status: Never Smoker   . Smokeless tobacco: Not on file  . Alcohol Use: No  . Drug Use:   .  Sexually Active:    Other Topics Concern  . Not on file   Social History Narrative  . No narrative on file   Review of Systems Some nausea Had vomiting just once  Appetite is gone Some loose stools    Objective:   Physical Exam  Constitutional: She appears well-developed and well-nourished.       Course cough  HENT:  Right Ear: External ear normal.  Left Ear: External ear normal.  Mouth/Throat: Oropharynx is clear and moist. No oropharyngeal exudate.       Moderate nasal congestion  Neck: Normal range of motion. Neck supple.  Pulmonary/Chest: Effort normal. No respiratory distress. She has no wheezes. She has no rales.       Tight cough No prolonged exp phase or sig wheezing Decreased total air flow though  Lymphadenopathy:    She has no cervical adenopathy.          Assessment & Plan:

## 2011-04-27 NOTE — Assessment & Plan Note (Signed)
Has some maxillary tenderness Likely viral but will treat with amoxil given the asthma component Discussed supportive care

## 2011-04-28 ENCOUNTER — Telehealth: Payer: Self-pay | Admitting: *Deleted

## 2011-04-28 MED ORDER — HYDROCODONE-HOMATROPINE 5-1.5 MG/5ML PO SYRP: ORAL_SOLUTION | ORAL | Status: DC

## 2011-04-28 NOTE — Telephone Encounter (Signed)
rx called into pharmacy, Spoke with patient and advised results

## 2011-04-28 NOTE — Telephone Encounter (Signed)
I don't like tussionex because it costs a fortune Okay to send hydrocodone-homotropine syrup 1-2 teaspoons at bedtime prn for severe cough #4 oz x 0

## 2011-04-28 NOTE — Telephone Encounter (Signed)
Pt was seen yesterday for URI.  She is unable to sleep due to her cough and she is asking if tussionex can be called to [facility].  She wanted you to know that she is doing her nebulizer treatments.

## 2011-05-04 ENCOUNTER — Telehealth: Payer: Self-pay | Admitting: *Deleted

## 2011-05-04 NOTE — Telephone Encounter (Signed)
Please find out if the problem is that she takes a acid blocker at all, or nexium specifically  If she has not tried a generic like prilosec, protonix or prevacid---I cannot write a letter stating that she has to have the nexium I can write a letter that she medically needs an acid blocker

## 2011-05-04 NOTE — Telephone Encounter (Signed)
Pt needs a letter for her worker's [employer] stating that it's medically necessary that she take nexium.  They had been paying for this but now say that they wont anymore.  Pt's lawyer has told her that if she gets a letter from you, stating that she needs it due to her stomach being messed up by all the other meds that she has to take, that they will pay for it.  Please call when letter is ready.

## 2011-05-04 NOTE — Telephone Encounter (Signed)
Spoke with patient and she will take the letter stating she can take the acid blocker.

## 2011-05-05 NOTE — Telephone Encounter (Signed)
But that means she has to use one of the generics, like omeprazole, and not the nexium!!!!

## 2011-05-05 NOTE — Telephone Encounter (Signed)
Spoke with patient and she would like the letter stating that she can take a acid blocker. She understands it won't be nexium, she doesn't like it because none of the others work. Please advise

## 2011-05-06 NOTE — Telephone Encounter (Signed)
I still can't write a letter stating that she needs the acid blocker due to other meds because she is not on an anti-inflammatory medication I can write a letter generically that she requires an acid blocker for her acid reflux

## 2011-05-10 ENCOUNTER — Encounter: Payer: Self-pay | Admitting: Internal Medicine

## 2011-05-10 DIAGNOSIS — Z0279 Encounter for issue of other medical certificate: Secondary | ICD-10-CM

## 2011-05-10 NOTE — Telephone Encounter (Signed)
Patient would like a letter that states she require an acid blocker for her acid reflux.

## 2011-05-10 NOTE — Telephone Encounter (Signed)
Letter written I did it in the wrong section so it still needs to be scanned $20 charge please

## 2011-05-12 ENCOUNTER — Ambulatory Visit: Payer: Self-pay | Admitting: Family Medicine

## 2011-05-27 ENCOUNTER — Telehealth: Payer: Self-pay | Admitting: *Deleted

## 2011-05-27 NOTE — Telephone Encounter (Signed)
Pt is requesting that another letter be written so that she can try and get her nexium approved.  Her lawyer told her that he would be unable to get medicine approved with the last letter you wrote because there was mention of a pre existing condition- her acid reflux.  She wants the letter to read that she needs the nexium due to damage from taking NSAIDS.  I explained to her that you said you couldn't write a letter with that reason because she doesn't take any of those meds, but she says she used to and that's what damaged her stomach.  Please advise.

## 2011-05-27 NOTE — Telephone Encounter (Signed)
We don't have her listed on a NSAID and so I can't write a letter that says she needs the med to protect her stomach

## 2011-05-28 NOTE — Telephone Encounter (Signed)
Patient stated she will talk with Dr.Letvak when she comes in for OV.

## 2011-06-09 ENCOUNTER — Telehealth: Payer: Self-pay | Admitting: *Deleted

## 2011-06-09 NOTE — Telephone Encounter (Signed)
Please find out what their concerns are and what they would like to get out of a "peer to peer" discussion My biggest problem is when the insurance companies don't tell us what they want What do they want?

## 2011-06-09 NOTE — Telephone Encounter (Signed)
Rep from insurance company would like to schedule peer to peer pharmacy phone review with you regarding pt's nexium.  Please advise if ok to schedule.

## 2011-06-18 NOTE — Telephone Encounter (Signed)
.  left message to have rep return my call.

## 2011-06-22 ENCOUNTER — Telehealth: Payer: Self-pay | Admitting: Internal Medicine

## 2011-06-22 NOTE — Telephone Encounter (Signed)
Call from Dr Dell Ponto from [facility]--reviewing pharmacy claims Explained that I believe she may be taking OTC NSAIDs still Failed omeprazole Discussed trying to cut down on the nexium dose----will review at her next visit

## 2011-06-23 NOTE — Telephone Encounter (Signed)
Tried calling again, left message.

## 2011-06-24 NOTE — Telephone Encounter (Signed)
Left message on voicemail for rep to return call.

## 2011-06-25 NOTE — Telephone Encounter (Signed)
Stanton Kidney called back and peer to peer wants to know the following information:  1) patients current medication regimen, 2) whether or not there is a continued need for treatment, 3) if medication effective, is patient improving.

## 2011-06-25 NOTE — Telephone Encounter (Signed)
I called Debra at [phone] and left v/m for Debra to call back.

## 2011-06-25 NOTE — Telephone Encounter (Signed)
I spoke to Dr Dell Ponto personally on 9/4 about this situation  They should not require any further information

## 2011-06-25 NOTE — Telephone Encounter (Signed)
Rep called and left a message for [place] to return her call.  I called back and got her voicemail, I left a message requesting that she call us and give [place] specific details as to what they need and why a peer to peer discussion needs to be done.

## 2011-06-28 ENCOUNTER — Telehealth: Payer: Self-pay

## 2011-06-28 NOTE — Telephone Encounter (Signed)
Hilda Blades returned call and I told her that Dr Silvio Pate had spoken with Dr Glade Stanford on 06/22/11 and they should not require any further info. Hilda Blades checked and said Dr Glade Stanford will handle going forward.

## 2011-06-29 ENCOUNTER — Ambulatory Visit: Payer: Self-pay | Admitting: Physical Medicine & Rehabilitation

## 2011-06-29 ENCOUNTER — Encounter: Payer: Worker's Compensation | Attending: Physical Medicine & Rehabilitation

## 2011-06-29 DIAGNOSIS — G90519 Complex regional pain syndrome I of unspecified upper limb: Secondary | ICD-10-CM

## 2011-06-29 DIAGNOSIS — M79609 Pain in unspecified limb: Secondary | ICD-10-CM | POA: Insufficient documentation

## 2011-06-29 DIAGNOSIS — G9050 Complex regional pain syndrome I, unspecified: Secondary | ICD-10-CM | POA: Insufficient documentation

## 2011-06-29 DIAGNOSIS — Z79899 Other long term (current) drug therapy: Secondary | ICD-10-CM | POA: Insufficient documentation

## 2011-06-29 DIAGNOSIS — K219 Gastro-esophageal reflux disease without esophagitis: Secondary | ICD-10-CM | POA: Insufficient documentation

## 2011-06-30 NOTE — Assessment & Plan Note (Signed)
CHIEF COMPLAINT:  Right upper extremity pain.  HISTORY:  A 45 year old female who had a left wrist sprain in the 1990s when working in [place].  She has been diagnosed and treated with RSD, has been followed in our clinic for number of years and over time has been able to come off her morphine and had a replacement regimen that consists of the following medications; 1. Cymbalta 30 mg 3 p.o. daily. 2. Topamax 100 mg q.a.m. and Topamax 200 mg nightly. 3. Tizanidine 4 mg one-half p.o. b.i.d. and 1 p.o. nightly. 4. Lidoderm patch 5% up to 3 patches daily at night. 5. Tramadol 50 mg nightly. 6. Ultram ER 100 mg daily. 7. Motrin 800 mg t.i.d. 8. Hydrocodone 7.5/500 one p.o. nightly. 9. She has tried going without the hydrocodone, but this has been     difficult in terms of her sleep.  She is trying to go without the     tramadol 50 mg at night but this has also been difficult for sleep.     At one point, she was taking to the tizanidine at night but start     have some hallucinations and back down to one without recurrence.  REVIEW OF SYSTEMS:  Positive for reflux type symptoms.  She was on Nexium 40 mg b.i.d. and this improved.  However, apparently her insurance is no longer paying for it.  She requests a letter from me to indicate that I think it may be medication related from her other analgesic medications, i.e. the Valium.  Her pain is described as sharp, burning, stabbing, tingling, and aching.  REVIEW OF SYSTEMS:  Positive for nausea as well as coughing as well as anxiety as well as spasms, numbness, tremor, tingling.  PHYSICAL EXAMINATION:  VITAL SIGNS:  Blood pressure 159/87, pulse 82, respirations 16, and O2 sat 96% on room air. GENERAL:  No acute stress.  Mood and affect appropriate. EXTREMITIES:  Her upper extremity strength is 4/5, this is chronic and related to pain.  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 There is no evidence of erythema or swelling in the  upper extremities. NEUROLOGIC:  Her gait is normal.  Mood and affect are appropriate.  IMPRESSION: 1. Reflex sympathetic dystrophy.  She has remained functional and in     fact has gone back to working out in the pool.  She takes aquatic     exercises for pain and arthritis at [employer].  She has maintained     very good functioning, has been able to take care of her young     children. 2. Gastroesophageal reflux disease.  I think this is related to her     Motrin.  We will reduce from 800-600.  I have agreed to write her a     letter explaining that I think this may be a medication induced     GERD.  My goal is to get     her off Motrin and we will do this gradually.  She has had no black     tarry stool.  No overt blood per rectum. 3. Discontinue hydrocodone 7.5/325 nightly, continue other medications     as listed above.     [name], M.D. Electronically Signed    AEK/MedQ D:  06/29/2011 11:16:51  T:  06/29/2011 15:21:43  Job #:  [id number]  cc:   Venia Carbon, MD Fax: [phone]

## 2011-07-09 LAB — TSH: TSH: 0.579

## 2011-07-09 LAB — BASIC METABOLIC PANEL
CO2: 26
Calcium: 9.1
GFR calc Af Amer: >60
GFR calc non Af Amer: >60
Sodium: 140

## 2011-07-09 LAB — I-STAT 8, (EC8 V) (CONVERTED LAB)
Chloride: 108
HCT: 47 — ABNORMAL HIGH
Potassium: 3 — ABNORMAL LOW
Sodium: 141
pH, Ven: 7.41 — ABNORMAL HIGH

## 2011-07-09 LAB — CBC
HCT: 42.8
Hemoglobin: 13.8
MCHC: 33.4
Platelets: 304
RBC: 4.78
RDW: 15
WBC: 13.4 — ABNORMAL HIGH
WBC: 15.2 — ABNORMAL HIGH

## 2011-07-09 LAB — LIPID PANEL
Cholesterol: 207 — ABNORMAL HIGH
LDL Cholesterol: 122 — ABNORMAL HIGH
Triglycerides: 245 — ABNORMAL HIGH

## 2011-07-09 LAB — COMPREHENSIVE METABOLIC PANEL
ALT: 15
AST: 17
Albumin: 4
Alkaline Phosphatase: 87
BUN: 16
Calcium: 9.5
Creatinine, Ser: 0.84
GFR calc Af Amer: >60
GFR calc non Af Amer: >60
Total Protein: 7.2

## 2011-07-09 LAB — CARDIAC PANEL(CRET KIN+CKTOT+MB+TROPI)
CK, MB: 0.8
Relative Index: INVALID
Total CK: 33

## 2011-07-09 LAB — PROTIME-INR
INR: 0.9
Prothrombin Time: 12.4

## 2011-07-09 LAB — DIFFERENTIAL
Basophils Relative: 0
Eosinophils Absolute: 0.1
Neutrophils Relative %: 69

## 2011-07-09 LAB — CK TOTAL AND CKMB (NOT AT ARMC)
CK, MB: 1.2
Relative Index: INVALID
Total CK: 48

## 2011-07-09 LAB — POCT I-STAT CREATININE: Creatinine, Ser: 1

## 2011-07-09 LAB — HEMOGLOBIN A1C
Hgb A1c MFr Bld: 5.9
Mean Plasma Glucose: 133

## 2011-07-09 LAB — HOMOCYSTEINE: Homocysteine: 8

## 2011-07-27 ENCOUNTER — Encounter: Payer: Worker's Compensation | Attending: Physical Medicine & Rehabilitation

## 2011-07-27 ENCOUNTER — Ambulatory Visit: Payer: Worker's Compensation | Admitting: Physical Medicine & Rehabilitation

## 2011-07-27 DIAGNOSIS — Z79899 Other long term (current) drug therapy: Secondary | ICD-10-CM | POA: Insufficient documentation

## 2011-07-27 DIAGNOSIS — K219 Gastro-esophageal reflux disease without esophagitis: Secondary | ICD-10-CM | POA: Insufficient documentation

## 2011-07-27 DIAGNOSIS — G90519 Complex regional pain syndrome I of unspecified upper limb: Secondary | ICD-10-CM

## 2011-07-27 DIAGNOSIS — G9050 Complex regional pain syndrome I, unspecified: Secondary | ICD-10-CM | POA: Insufficient documentation

## 2011-07-27 DIAGNOSIS — M79609 Pain in unspecified limb: Secondary | ICD-10-CM | POA: Insufficient documentation

## 2011-07-28 NOTE — Assessment & Plan Note (Signed)
REASON FOR VISIT:  Left upper extremity pain.  HISTORY:  A 45 year old female with work related injury in 1990s, wrist sprain followed by reflex sympathetic dystrophy.  She has been functioning independently on combination of medications that has been established over the period of years when I have been seeing her.  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  She continues to have pain rated as 7-8/10.  She is able to take care of her twins who are ages 61.  She still has difficulty with certain meal prep, household duties, and shopping, however.  REVIEW OF SYSTEMS:  Positive for weakness, numbness, tremor, tingling, spasms, dizziness, constipation, limb swelling.  She is well managed with medications and had no signs of aberrant drug behavior or medications side effects.  PHYSICAL EXAMINATION:  VITAL SIGNS:  Blood pressure 159/98, she is felt bit dizzy lately.  She has appointment with her primary physician in less than 1 week.  Pulse is 88, respirations 14, O2 sat 96% on room air. GENERAL:  No acute distress.  Mood and affect appropriate. EXTREMITIES:  Her left upper extremity has hypersensitivity to touch, some hyperhidrosis, no erythema.  She has 4/5 strength in the left deltoid, biceps, triceps, and grip secondary to pain inhibition.  Gait is normal.  Mood and affect are appropriate.  Mentation appears normal.  IMPRESSION:  [place], left upper extremity is well managed with current medication regimen.  This consist of; 1. Hydrocodone 7.5 nightly. 2. Tramadol 50 nightly. 3. Tramadol ER 100 mg q.a.m. 4. Lidoderm 3 patches per day which she wears at night. 5. Tizanidine one-half p.o. b.i.d. 1 p.o. nightly. 6. Topamax 100 mg q.a.m. and 200 nightly. 7. Cymbalta 60 mg per day.  We reduced her Motrin, she just got the new prescription fax at the pharmacy down from 800 and 600, we will need to follow up next visit to see we wean this further when I see her again.   Discussed with the patient and agrees the plan.     [name], M.D. Electronically Signed    AEK/MedQ D:  07/27/2011 10:01:09  T:  07/27/2011 15:40:20  Job #:  [id number]

## 2011-07-29 LAB — CBC
Platelets: 317
RBC: 4.79
WBC: 9.8

## 2011-08-02 ENCOUNTER — Ambulatory Visit (INDEPENDENT_AMBULATORY_CARE_PROVIDER_SITE_OTHER): Payer: Self-pay | Admitting: Internal Medicine

## 2011-08-02 ENCOUNTER — Encounter: Payer: Self-pay | Admitting: Internal Medicine

## 2011-08-02 VITALS — BP 151/92 | HR 78 | Temp 99.2°F | Ht 67.0 in | Wt 213.0 lb

## 2011-08-02 DIAGNOSIS — I10 Essential (primary) hypertension: Secondary | ICD-10-CM

## 2011-08-02 DIAGNOSIS — R079 Chest pain, unspecified: Secondary | ICD-10-CM

## 2011-08-02 DIAGNOSIS — R0789 Other chest pain: Secondary | ICD-10-CM

## 2011-08-02 DIAGNOSIS — Z0000 Encounter for general adult medical examination without abnormal findings: Secondary | ICD-10-CM | POA: Insufficient documentation

## 2011-08-02 DIAGNOSIS — Z23 Encounter for immunization: Secondary | ICD-10-CM

## 2011-08-02 MED ORDER — METOPROLOL TARTRATE 25 MG PO TABS: 25.0000 mg | ORAL_TABLET | Freq: Two times a day (BID) | ORAL | Status: DC

## 2011-08-02 MED ORDER — SIMVASTATIN 20 MG PO TABS: 20.0000 mg | ORAL_TABLET | Freq: Every day | ORAL | Status: DC

## 2011-08-02 MED ORDER — PRAVASTATIN SODIUM 20 MG PO TABS: 20.0000 mg | ORAL_TABLET | Freq: Every day | ORAL | Status: DC

## 2011-08-02 NOTE — Assessment & Plan Note (Signed)
Trying to do some exercise now--hard to do with chronic pain Just had colonoscopy Due for mammo, etc---she will call to set up with Dr Penne Lash

## 2011-08-02 NOTE — Patient Instructions (Addendum)
Please increase the metoprolol to a full tab (25mg ) twice a day Please call Dr Leggett's office to set up your follow up  Set up blood work in about 6 weeks (lipid, hepatic---272.4, CBC with diff, met b, TSH---401.9)

## 2011-08-02 NOTE — Progress Notes (Signed)
Subjective:    Patient ID: [name], female    DOB: May 02, 1966, 45 y.o.   MRN: [id number]  HPI Doing okay Headache today but okay in general Has started going to gym---has lost a couple of pounds  Continues to see Dr Wynn Banker for pain management Does get occ heavy feeling at top of head and dizzy feeling Can occur at rest or with activity BP always high at his office---wonders if that is the cause of headache  Had repeat colonoscopy about 6 months ago Due to history of adenomatous polyps and did have some again  Has been seeing Dr Penne Lash [place] there Has had more urinary urgency and frequency occ sharp pains---esp during sex Due for follow up   Current Outpatient Prescriptions on File Prior to Visit  Medication Sig Dispense Refill  . albuterol (PROAIR HFA) 108 (90 BASE) MCG/ACT inhaler Inhale 2 puffs into the lungs every 4 (four) hours as needed.        . DULoxetine (CYMBALTA) 30 MG capsule Take 30 mg by mouth 2 (two) times daily.        Marland Kitchen esomeprazole (NEXIUM) 40 MG capsule Take 40 mg by mouth 2 (two) times daily.        Marland Kitchen HYDROcodone-homatropine (HYCODAN) 5-1.5 MG/5ML syrup Take 1-2 teaspoons at bedtime as needed for severe cough  120 mL  0  . loratadine (CLARITIN) 10 MG tablet Take 10 mg by mouth daily.        . montelukast (SINGULAIR) 10 MG tablet Take 10 mg by mouth daily.        Marland Kitchen tiZANidine (ZANAFLEX) 4 MG tablet Take 4 mg by mouth 4 (four) times daily.        Marland Kitchen topiramate (TOPAMAX) 200 MG tablet Take 200 mg by mouth 2 (two) times daily.          Allergies  Allergen Reactions  . Diazepam     REACTION: swelling  . Meperidine Hcl     REACTION: swelling  . Oxycodone-Acetaminophen     REACTION: Hives    Past Medical History  Diagnosis Date  . Anxiety   . Asthma   . History of colonic polyps   . GERD (gastroesophageal reflux disease)   . Reflex sympathetic dystrophy   . Migraines   . Sleep apnea   . Hypertension   . Allergy   . Hyperlipidemia   . ASCVD  (arteriosclerotic cardiovascular disease)     Past Surgical History  Procedure Date  . Cesarean section   . Tonsillectomy   . Nasal septal repair   . Abdominal hysterectomy   . Appendectomy   . Bladder lift 10/18/2005  . Oophorectomy 10/18/2006    right    Family History  Problem Relation Age of Onset  . Cancer Mother     lung  . Hypertension Mother   . Cancer Father     colon  . Hypertension Sister   . Birth defects Maternal Grandmother     breast  . Birth defects Paternal Grandmother     uterine, stomach, lung    History   Social History  . Marital Status: Married    Spouse Name: N/A    Number of Children: 2  . Years of Education: N/A   Occupational History  . Disabled due to RSD    Social History Main Topics  . Smoking status: Never Smoker   . Smokeless tobacco: Never Used  . Alcohol Use: No  . Drug Use:   . Sexually  Active:    Other Topics Concern  . Not on file   Social History Narrative  . No narrative on file   Review of Systems  Constitutional: Positive for fatigue. Negative for unexpected weight change.       Wears seat belt  HENT: Positive for congestion and rhinorrhea. Negative for hearing loss, dental problem and tinnitus.        Can't afford the singulair Regular with dentist  Eyes: Negative for visual disturbance.       No diplopia or unilateral vision loss Does get "dark" sensation in both eyes when she has her headaches  Respiratory: Positive for shortness of breath and wheezing. Negative for cough and chest tightness.        Occ SOB with panic attacks Stable DOE occ wheezing--rarely uses inhaler (once a week or 2)  Cardiovascular: Positive for chest pain, palpitations and leg swelling.       Has episode in axilla and shoulder blade---thought it was gas. Stabbing quality occ palpitations   Gastrointestinal: Positive for constipation. Negative for nausea, vomiting, abdominal pain and blood in stool.       Nexium controls  reflux Chronic constipation--- "nothing works"  Genitourinary: Positive for urgency, frequency, difficulty urinating and dyspareunia.  Musculoskeletal: Positive for myalgias and arthralgias. Negative for back pain.  Skin: Negative for rash.       No suspicious areas  Neurological: Positive for dizziness and headaches. Negative for syncope, weakness and numbness.  Hematological: Negative for adenopathy. Does not bruise/bleed easily.  Psychiatric/Behavioral: Positive for sleep disturbance and dysphoric mood. The patient is hyperactive.        Chronic sleep problems Some panic attacks---not frequent occ depression---not persistent       Objective:   Physical Exam  Constitutional: She is oriented to person, place, and time. She appears well-developed and well-nourished. No distress.  HENT:  Head: Normocephalic and atraumatic.  Right Ear: External ear normal.  Left Ear: External ear normal.  Mouth/Throat: Oropharynx is clear and moist. No oropharyngeal exudate.       TMs normal  Eyes: Conjunctivae and EOM are normal. Pupils are equal, round, and reactive to light.       Fundi benign  Neck: Normal range of motion. Neck supple. No thyromegaly present.  Cardiovascular: Normal rate, regular rhythm, normal heart sounds and intact distal pulses.  Exam reveals no gallop.   No murmur heard. Pulmonary/Chest: Effort normal and breath sounds normal. No respiratory distress. She has no wheezes. She has no rales.  Abdominal: Soft. There is no tenderness.  Musculoskeletal: She exhibits no edema and no tenderness.  Lymphadenopathy:    She has no cervical adenopathy.    She has no axillary adenopathy.  Neurological: She is alert and oriented to person, place, and time.  Skin: No rash noted.  Psychiatric: Her behavior is normal. Judgment and thought content normal.       Mild dysphoric mood but approp affect          Assessment & Plan:

## 2011-08-02 NOTE — Assessment & Plan Note (Addendum)
Very atypical EKG shows sinus rhythm with non specific IVCD No ischemic changes No action for now---doubt CAD

## 2011-08-02 NOTE — Assessment & Plan Note (Signed)
BP Readings from Last 3 Encounters:  08/02/11 151/92  04/27/11 140/80  01/20/11 148/80   Repeat by me 156/92 on left Will increase metoprolol to 25 bid

## 2011-08-09 ENCOUNTER — Other Ambulatory Visit: Payer: Self-pay | Admitting: *Deleted

## 2011-08-09 NOTE — Telephone Encounter (Signed)
Spoke with the pharmacist and advised rx was sent over, Spoke with patient and advised results

## 2011-08-09 NOTE — Telephone Encounter (Signed)
The simvastatin was stopped and replaced by pravastatin Both Rx's were sent Please check with the pharmacy and clarify with her

## 2011-08-09 NOTE — Telephone Encounter (Signed)
Pt was here for office visit last week and was told that simvastatin and metoprolol would be sent in to [facility] [address], but they weren't.  She then said that you were going to change one of the meds because it's no longer on their discount list.  Please advise.

## 2011-08-10 ENCOUNTER — Other Ambulatory Visit: Payer: Self-pay | Admitting: *Deleted

## 2011-08-10 MED ORDER — ALBUTEROL SULFATE HFA 108 (90 BASE) MCG/ACT IN AERS: 2.0000 | INHALATION_SPRAY | RESPIRATORY_TRACT | Status: DC | PRN

## 2011-08-16 ENCOUNTER — Encounter: Payer: Self-pay | Admitting: Internal Medicine

## 2011-08-26 ENCOUNTER — Ambulatory Visit: Payer: Self-pay | Admitting: Obstetrics & Gynecology

## 2011-09-07 ENCOUNTER — Other Ambulatory Visit: Payer: Self-pay | Admitting: Internal Medicine

## 2011-09-07 DIAGNOSIS — I10 Essential (primary) hypertension: Secondary | ICD-10-CM

## 2011-09-07 DIAGNOSIS — E785 Hyperlipidemia, unspecified: Secondary | ICD-10-CM

## 2011-09-13 ENCOUNTER — Other Ambulatory Visit: Payer: Self-pay

## 2011-09-14 ENCOUNTER — Emergency Department (HOSPITAL_COMMUNITY): Payer: Self-pay

## 2011-09-14 ENCOUNTER — Encounter (HOSPITAL_COMMUNITY): Payer: Self-pay

## 2011-09-14 ENCOUNTER — Emergency Department (HOSPITAL_COMMUNITY)
Admission: EM | Admit: 2011-09-14 | Discharge: 2011-09-14 | Disposition: A | Discharge status: Home / Self Care | Payer: Self-pay | Attending: Emergency Medicine | Admitting: Emergency Medicine

## 2011-09-14 ENCOUNTER — Other Ambulatory Visit: Payer: Self-pay

## 2011-09-14 DIAGNOSIS — J45909 Unspecified asthma, uncomplicated: Secondary | ICD-10-CM | POA: Insufficient documentation

## 2011-09-14 DIAGNOSIS — Z79899 Other long term (current) drug therapy: Secondary | ICD-10-CM | POA: Insufficient documentation

## 2011-09-14 DIAGNOSIS — R059 Cough, unspecified: Secondary | ICD-10-CM | POA: Insufficient documentation

## 2011-09-14 DIAGNOSIS — M25519 Pain in unspecified shoulder: Secondary | ICD-10-CM | POA: Insufficient documentation

## 2011-09-14 DIAGNOSIS — R109 Unspecified abdominal pain: Secondary | ICD-10-CM | POA: Insufficient documentation

## 2011-09-14 DIAGNOSIS — R079 Chest pain, unspecified: Secondary | ICD-10-CM | POA: Insufficient documentation

## 2011-09-14 DIAGNOSIS — K219 Gastro-esophageal reflux disease without esophagitis: Secondary | ICD-10-CM | POA: Insufficient documentation

## 2011-09-14 DIAGNOSIS — I10 Essential (primary) hypertension: Secondary | ICD-10-CM | POA: Insufficient documentation

## 2011-09-14 DIAGNOSIS — R05 Cough: Secondary | ICD-10-CM | POA: Insufficient documentation

## 2011-09-14 DIAGNOSIS — R6884 Jaw pain: Secondary | ICD-10-CM | POA: Insufficient documentation

## 2011-09-14 DIAGNOSIS — E785 Hyperlipidemia, unspecified: Secondary | ICD-10-CM | POA: Insufficient documentation

## 2011-09-14 LAB — CBC
Platelets: 227 10*3/uL (ref 150–400)
RBC: 4.75 MIL/uL (ref 3.87–5.11)
WBC: 11 10*3/uL — ABNORMAL HIGH (ref 4.0–10.5)

## 2011-09-14 LAB — HEPATIC FUNCTION PANEL
ALT: 15 U/L (ref 0–35)
AST: 19 U/L (ref 0–37)
Albumin: 3.8 g/dL (ref 3.5–5.2)
Bilirubin, Direct: <0.1 mg/dL (ref 0.0–0.3)
Total Bilirubin: 0.4 mg/dL (ref 0.3–1.2)

## 2011-09-14 LAB — DIFFERENTIAL
Basophils Absolute: 0 10*3/uL (ref 0.0–0.1)
Basophils Relative: 0 % (ref 0–1)
Eosinophils Absolute: 0.3 10*3/uL (ref 0.0–0.7)
Lymphocytes Relative: 18 % (ref 12–46)
Lymphs Abs: 2 10*3/uL (ref 0.7–4.0)
Neutro Abs: 8.2 10*3/uL — ABNORMAL HIGH (ref 1.7–7.7)
Neutrophils Relative %: 74 % (ref 43–77)

## 2011-09-14 LAB — POCT I-STAT TROPONIN I

## 2011-09-14 MED ORDER — MORPHINE SULFATE 4 MG/ML IJ SOLN
4.0000 mg | Freq: Once | INTRAMUSCULAR | Status: AC
  Administered 2011-09-14: 4 mg via INTRAVENOUS
  Filled 2011-09-14: qty 1

## 2011-09-14 MED ORDER — ASPIRIN 81 MG PO CHEW
324.0000 mg | CHEWABLE_TABLET | Freq: Once | ORAL | Status: AC
  Administered 2011-09-14: 324 mg via ORAL
  Filled 2011-09-14: qty 4

## 2011-09-14 MED ORDER — OXYCODONE-ACETAMINOPHEN 5-325 MG PO TABS
2.0000 | ORAL_TABLET | Freq: Once | ORAL | Status: AC
  Administered 2011-09-14: 2 via ORAL
  Filled 2011-09-14 (×2): qty 1

## 2011-09-14 MED ORDER — OXYCODONE-ACETAMINOPHEN 5-325 MG PO TABS: 2.0000 | ORAL_TABLET | Freq: Four times a day (QID) | ORAL | Status: AC | PRN

## 2011-09-14 NOTE — ED Provider Notes (Signed)
History     CSN: [id number] Arrival date & time: 09/14/2011 10:05 AM   First MD Initiated Contact with Patient 09/14/11 1103      Chief Complaint  Patient presents with  . Chest Pain    (Consider location/radiation/quality/duration/timing/severity/associated sxs/prior treatment) HPI The patient is a 45 year old female who presents today complaining of having had chest pain that began on the left side of her chest and radiated to the left shoulder arm beginning last night. Patient noted this around 5 PM. She initially thought it was gas. She says the pain is radiating to her left jaw as well. Patient does not smoke and does not have any history of DVT or PE. She does have a family history of heart attacks before the age of 81. Patient says that her pain has been constant but she has minutes where this is more severe. It is made worse with taking a deep breath and coughing.  Patient did wonder if this could be gas or her gallbladder and took some home remedies for this without relief. She has had mild cough recently but denies any fevers or chills. She knows that she has had an EKG in the past but does not think she has ever had a stress test. She cannot recall anything in particular that she was doing when this began yesterday. She has not taken aspirin. There are no other associated or modifying factors. Past Medical History  Diagnosis Date  . Anxiety   . Asthma   . History of colonic polyps   . GERD (gastroesophageal reflux disease)   . Reflex sympathetic dystrophy   . Migraines   . Sleep apnea   . Hypertension   . Allergy   . Hyperlipidemia   . ASCVD (arteriosclerotic cardiovascular disease)     Past Surgical History  Procedure Date  . Cesarean section   . Tonsillectomy   . Nasal septal repair   . Abdominal hysterectomy   . Appendectomy   . Bladder lift 10/18/2005  . Oophorectomy 10/18/2006    right    Family History  Problem Relation Age of Onset  . Cancer Mother    lung  . Hypertension Mother   . Cancer Father     colon  . Hypertension Sister   . Birth defects Maternal Grandmother     breast  . Birth defects Paternal Grandmother     uterine, stomach, lung    History  Substance Use Topics  . Smoking status: Never Smoker   . Smokeless tobacco: Never Used  . Alcohol Use: No    OB History    Grav Para Term Preterm Abortions TAB SAB Ect Mult Living                  Review of Systems  Constitutional: Negative.   HENT: Negative.   Eyes: Negative.   Respiratory: Positive for cough.   Cardiovascular: Positive for chest pain.  Gastrointestinal: Negative.   Genitourinary: Negative.   Musculoskeletal: Negative.   Skin: Negative.   Neurological: Negative.   Hematological: Negative.   Psychiatric/Behavioral: Negative.   All other systems reviewed and are negative.    Allergies  Diazepam; Meperidine hcl; and Oxycodone-acetaminophen  Home Medications   Current Outpatient Rx  Name Route Sig Dispense Refill  . ALBUTEROL SULFATE HFA 108 (90 BASE) MCG/ACT IN AERS Inhalation Inhale 2 puffs into the lungs every 4 (four) hours as needed. 1 Inhaler 4  . DULOXETINE HCL 30 MG PO CPEP Oral Take 30 mg  by mouth 2 (two) times daily.      Marland Kitchen ESOMEPRAZOLE MAGNESIUM 40 MG PO CPDR Oral Take 40 mg by mouth 2 (two) times daily.      Marland Kitchen LORATADINE 10 MG PO TABS Oral Take 10 mg by mouth daily.      Marland Kitchen METOPROLOL TARTRATE 25 MG PO TABS Oral Take 1 tablet (25 mg total) by mouth 2 (two) times daily. 180 tablet 3  . PRAVASTATIN SODIUM 20 MG PO TABS Oral Take 1 tablet (20 mg total) by mouth daily. 90 tablet 3  . TIZANIDINE HCL 4 MG PO TABS Oral Take 4 mg by mouth 4 (four) times daily.      . TOPIRAMATE 200 MG PO TABS Oral Take 200 mg by mouth daily.       BP 160/101  Pulse 100  Temp(Src) 98 F (36.7 C) (Oral)  Resp 18  Ht 5\' 7"  (1.702 m)  Wt 210 lb (95.255 kg)  BMI 32.89 kg/m2  SpO2 95%  Physical Exam  Nursing note and vitals reviewed. Constitutional:  She is oriented to person, place, and time. She appears well-developed and well-nourished. No distress.  HENT:  Head: Normocephalic and atraumatic.  Eyes: Conjunctivae and EOM are normal. Pupils are equal, round, and reactive to light.  Neck: Normal range of motion.  Cardiovascular: Normal rate, regular rhythm, normal heart sounds and intact distal pulses.  Exam reveals no gallop and no friction rub.   No murmur heard. Pulmonary/Chest: Effort normal. No respiratory distress. She has no wheezes. She has no rales.       Diminished throughout  Abdominal: Soft. Bowel sounds are normal. She exhibits no distension. There is no tenderness. There is no rebound and no guarding.  Musculoskeletal: Normal range of motion.  Neurological: She is alert and oriented to person, place, and time. No cranial nerve deficit. She exhibits normal muscle tone. Coordination normal.  Skin: Skin is warm and dry. No rash noted.  Psychiatric: She has a normal mood and affect.    ED Course  Procedures (including critical care time)  Date: 09/14/2011  Rate: 96  Rhythm: normal sinus rhythm  QRS Axis: normal  Intervals: normal  ST/T Wave abnormalities: normal  Conduction Disutrbances:incomplete RBBB  Narrative Interpretation:   Old EKG Reviewed: unchanged   Labs Reviewed  CBC  DIFFERENTIAL  I-STAT, CHEM 8  I-STAT TROPONIN I  D-DIMER, QUANTITATIVE   No results found.   No diagnosis found.    MDM   Patient was evaluated. Her history sounded relatively low risk for heart attack. However, patient did have a history of TIA and also has strong family history of coronary artery disease. Patient's EKG was unchanged from previous. Patient also described her pain worsening with taking a deep breath. Cardiac enzymes, chest x-ray, CBC, i-STAT, and d-dimer were ordered.  CBC showed only a white count of 11. Troponin and d-dimer were negative. Renal panel was unremarkable. Chest x-ray showed only atelectasis.  Patient  became increasingly concerned that her pain was due to an intraabdominal process.  Lipase, hepatic panel and US of the abdomen was all normal.  Repeat TNI at 3 hours was also WNL.  Patient had improvement in her symptoms.  She was discharged home in good condition and instructed to follow-up with her regular doctor.     Cyndra Numbers, MD 09/14/11 [phone]

## 2011-09-14 NOTE — ED Notes (Signed)
phlebotomy called for 3 hour marker.

## 2011-09-14 NOTE — ED Notes (Signed)
Chest pain began last night  Sharp pain  Lt. side of her chest into her lt. Jaw and arm and into her back, pain is constant,  Sharp pain causes sob. Denies any n/v, skin is p/w/d, resp. E/u

## 2011-09-15 ENCOUNTER — Ambulatory Visit (INDEPENDENT_AMBULATORY_CARE_PROVIDER_SITE_OTHER): Payer: Self-pay | Admitting: Family Medicine

## 2011-09-15 ENCOUNTER — Encounter: Payer: Self-pay | Admitting: Family Medicine

## 2011-09-15 DIAGNOSIS — R079 Chest pain, unspecified: Secondary | ICD-10-CM

## 2011-09-15 NOTE — Progress Notes (Signed)
Patient Name: [name] Date of Birth: 12-15-1965 Age: 44 y.o. Medical Record Number: [id number] Gender: female  History of Present Illness:  [name] is a 45 y.o. very pleasant female patient who presents with the following: one day hospital followup after having left-sided atypical chest pain and upper LEFT abdominal pain.  The patient describes left-sided chest pain that began deep to her breast that was dull  And pressure-like, it also radiated to her LEFT arm. On some degree it was also underneath her LEFT rib. This was not provoked by any increased needs of aerobic exercise. Prior to having this, she did eat a large fatty meal. She has had a similar experience previously, but it did resolve spontaneously.  Currently she is not having any chest pain, but she is currently taking pain medication. Electrocardiogram was unremarkable and unchanged from prior EKGs in the emergency room. Emergency room notes were reviewed.  D-dimer, chest x-ray, and abdominal ultrasound were all normal. No evidence of gallbladder dysfunction.  Lab Results  Component Value Date   CKTOTAL 33 12/07/2007   CKMB 0.8 12/07/2007   TROPONINI  Value: 0.01        NO INDICATION OF MYOCARDIAL INJURY. 12/07/2007   Lab Results  Component Value Date   DDIMER 0.30 09/14/2011   Comprehensive Metabolic Panel:    Component Value Date/Time   NA 140 07/20/2010 1235   K 4.6 07/20/2010 1235   CL 107 07/20/2010 1235   CO2 25 07/20/2010 1235   BUN 11 07/20/2010 1235   CREATININE 1.0 07/20/2010 1235   GLUCOSE 93 07/20/2010 1235   CALCIUM 9.1 07/20/2010 1235   AST 19 09/14/2011 1244   ALT 15 09/14/2011 1244   ALKPHOS 97 09/14/2011 1244   BILITOT 0.4 09/14/2011 1244   PROT 7.2 09/14/2011 1244   ALBUMIN 3.8 09/14/2011 1244   CBC:    Component Value Date/Time   WBC 11.0* 09/14/2011 1135   HGB 13.6 09/14/2011 1135   HCT 41.7 09/14/2011 1135   PLT 227 09/14/2011 1135   MCV 87.8 09/14/2011 1135   NEUTROABS 8.2*  09/14/2011 1135   LYMPHSABS 2.0 09/14/2011 1135   MONOABS 0.5 09/14/2011 1135   EOSABS 0.3 09/14/2011 1135   BASOSABS 0.0 09/14/2011 1135   2 weeks ago the patient did have a significant URI. Most of that coughing and symptoms have resolved at this point. She has significant chest pain when taking a deep breath, and with movement.   Cardiac risk factors: FH: father, no. Uncles and PGF - 40's with CAD MGF: CAD Sibs OK Has had a TIA/CVA in past HTN Lipids No drugs recently - distant history of cocaine greater than 20 years ago. No history of tobacco abuse. Obesity  Past Medical History, Surgical History, Social History, Family History, and Problem List have been reviewed in EHR and updated if relevant.  Review of Systems:  GEN: No acute illnesses, no fevers, chills. GI: No n/v/d, eating normally Pulm: No SOB Interactive and getting along well at home.  Otherwise, ROS is as per the HPI.   Physical Examination: Filed Vitals:   09/15/11 1227  BP: 142/100  Pulse: 84  Temp: 99.3 F (37.4 C)  TempSrc: Oral  Height: 5\' 7"  (1.702 m)  Weight: 214 lb 8 oz (97.297 kg)     GEN: WDWN, NAD, Non-toxic, A & O x 3 HEENT: Atraumatic, Normocephalic. Neck supple. No masses, No LAD. Ears and Nose: No external deformity. CV: RRR, No M/G/R. No JVD. No thrill. No  extra heart sounds. Chest wall: left-sided cardiac chest wall is tender to palpation. There is tender to palpation at approximately ribs 3 through 6 on the LEFT anterior chest wall and some irritation and pain at the sternal junction. Abdomen: Soft, nontender, nondistended. Positive bowel sounds PULM: CTA B, no wheezes, crackles, rhonchi. No retractions. No resp. distress. No accessory muscle use. EXTR: No c/c/e NEURO Normal gait.  PSYCH: Normally interactive. Conversant. Not depressed or anxious appearing.  Calm demeanor.   *RADIOLOGY REPORT*   Clinical Data: Chest pain, back pain.   CHEST - 2 VIEW   Comparison:  12/04/2007   Findings: Heart is normal size.  Minimal bibasilar atelectasis.  No effusions or acute bony abnormality.   IMPRESSION: Minimal bibasilar atelectasis.   Original Report Authenticated By: Cyndie Chime, M.D.      *RADIOLOGY REPORT*   Clinical Data:  Upper abdominal pain.   COMPLETE ABDOMINAL ULTRASOUND   Comparison:  None   Findings:   Gallbladder:  No gallstones, gallbladder wall thickening, or pericholecystic fluid.   Common bile duct:  Normal in caliber measuring a maximum of 4.34mm.   Liver:  There is diffuse increased echogenicity of the liver and decreased through transmission consistent with fatty infiltration. No focal lesions or biliary dilatation.   IVC:  Normal caliber.   Pancreas:  Sonographically normal.   Spleen:  Normal size and echogenicity without focal lesions.   Right Kidney:  11.1 cm in length. Normal renal cortical thickness and echogenicity without focal lesions or hydronephrosis.   Left Kidney:  11.4 cm in length. Normal renal cortical thickness and echogenicity without focal lesions or hydronephrosis.   Abdominal aorta:  Normal caliber.   IMPRESSION:   1.  Normal sonographic appearance of the gallbladder and normal caliber common bile duct. 2.  Diffuse fatty infiltration of the liver. 3.  Normal sonographic appearance of the spleen, pancreas and both kidneys.   Original Report Authenticated By: P. Loralie Champagne, M.D.   Assessment and Plan: 1. Chest pain  Echocardiogram stress test WITH contrast    The patient has had normal cardiac enzymes as well as no changes in her electrocardiogram. She does have tenderness to palpation and tenderness with taking a deep breath. I suspect that this is noncardiac chest pain, it may have been brought about by her prior URI with irritation of her pleural lining. Certainly it seems as if she has some degree of costochondritis.  Nevertheless, the patient has virtually all cardiac risk  factors  And has a prior history of a cerebrovascular event. This places her at very high risk for cardiac disease. She has never had a stress test. She believed that she can exercise on a treadmill, so we will obtain a stress echocardiogram  To evaluate for potential ischemic cardiac disease.  For now, I am also to have her take some Advil or Aleve for the next 2 weeks

## 2011-09-15 NOTE — Patient Instructions (Signed)
REFERRAL: GO THE THE FRONT ROOM AT THE ENTRANCE OF OUR CLINIC, NEAR CHECK IN. ASK FOR MARION. SHE WILL HELP YOU SET UP YOUR REFERRAL. DATE: TIME:  

## 2011-09-16 ENCOUNTER — Ambulatory Visit: Payer: Self-pay | Admitting: Internal Medicine

## 2011-09-24 ENCOUNTER — Other Ambulatory Visit (HOSPITAL_COMMUNITY): Payer: Self-pay

## 2011-09-24 ENCOUNTER — Ambulatory Visit (HOSPITAL_COMMUNITY): Payer: Self-pay | Attending: Cardiology | Admitting: Radiology

## 2011-09-24 ENCOUNTER — Ambulatory Visit (HOSPITAL_BASED_OUTPATIENT_CLINIC_OR_DEPARTMENT_OTHER): Payer: Self-pay | Admitting: Radiology

## 2011-09-24 ENCOUNTER — Other Ambulatory Visit (HOSPITAL_COMMUNITY): Payer: Self-pay | Admitting: Radiology

## 2011-09-24 DIAGNOSIS — G4730 Sleep apnea, unspecified: Secondary | ICD-10-CM | POA: Insufficient documentation

## 2011-09-24 DIAGNOSIS — J45909 Unspecified asthma, uncomplicated: Secondary | ICD-10-CM | POA: Insufficient documentation

## 2011-09-24 DIAGNOSIS — R079 Chest pain, unspecified: Secondary | ICD-10-CM | POA: Insufficient documentation

## 2011-09-24 DIAGNOSIS — E785 Hyperlipidemia, unspecified: Secondary | ICD-10-CM | POA: Insufficient documentation

## 2011-09-24 DIAGNOSIS — I10 Essential (primary) hypertension: Secondary | ICD-10-CM | POA: Insufficient documentation

## 2011-09-24 DIAGNOSIS — R0989 Other specified symptoms and signs involving the circulatory and respiratory systems: Secondary | ICD-10-CM

## 2011-09-24 DIAGNOSIS — K219 Gastro-esophageal reflux disease without esophagitis: Secondary | ICD-10-CM | POA: Insufficient documentation

## 2011-09-24 DIAGNOSIS — I6789 Other cerebrovascular disease: Secondary | ICD-10-CM | POA: Insufficient documentation

## 2011-09-24 DIAGNOSIS — R072 Precordial pain: Secondary | ICD-10-CM

## 2011-09-28 ENCOUNTER — Encounter: Payer: Worker's Compensation | Attending: Physical Medicine & Rehabilitation

## 2011-09-28 ENCOUNTER — Ambulatory Visit: Payer: Worker's Compensation | Admitting: Physical Medicine & Rehabilitation

## 2011-09-28 DIAGNOSIS — G894 Chronic pain syndrome: Secondary | ICD-10-CM

## 2011-09-28 DIAGNOSIS — G90519 Complex regional pain syndrome I of unspecified upper limb: Secondary | ICD-10-CM

## 2011-10-04 NOTE — Assessment & Plan Note (Signed)
This is patient of Dr. Wynn Banker who is seen on his schedule today that I have actually seen due to Dr. Wynn Banker being delayed for consult in the hospital.  Ms. Nunzio Cory is doing well with her RSD.  She has not been able to wean any further than the 600 mg of ibuprofen 2-3 times a day. She states she would like to get through the cool weather before she goes down any further.  Her average pain is 7-8.  It is a sharp, burning, stabbing, tingling, aching pain.  General activity level is 7. Pain is same 24 hours a day.  Sleep patterns are poor.  Pain is worse with walking, bending, sitting, activity and standing.  Rest, heat, ice, medication, TENS unit help.  She walks without assistance.  She can climb steps and drive.  Functionally, she is unemployed.  She needs some help with household duties and shopping.  REVIEW OF SYSTEMS:  Notable for difficulties described above as well as some night sweats, weight gain, GI issues like nausea and constipation, coughing, wheezing, sleep apnea, shortness of breath, paresthesias, spasms, dizziness, and anxiety.  No suicidal thoughts or aberrant behaviors.  Last pill count and UDS consistent.  PAST MEDICAL HISTORY/SOCIAL HISTORY/FAMILY HISTORY:  Unchanged.  PHYSICAL EXAMINATION:  Blood pressure is 152/89, pulse 72, respirations 18, O2 sats 96 on room air.  Motor strength sensation intact. Constitutionally, she is mildly obese.  She is alert and oriented x3. She has a normal gait.  ASSESSMENT:  Reflex sympathetic dystrophy, left upper extremity, managed well.  PLAN:  Refill hydrocodone as needed.  She just got it filled.  She will continue her ibuprofen 600 mg up to 3 times a day, otherwise she is doing well.  Her questions were encouraged and answered.  We will see her back in 2 months.     Sutter Ahlgren L. Blima Dessert Electronically Signed    RLW/MedQ D:  09/28/2011 09:57:03  T:  09/28/2011 23:37:55  Job #:  [id number]

## 2011-11-03 ENCOUNTER — Other Ambulatory Visit: Payer: Self-pay | Admitting: Internal Medicine

## 2011-11-03 MED ORDER — ESOMEPRAZOLE MAGNESIUM 40 MG PO CPDR: 40.0000 mg | DELAYED_RELEASE_CAPSULE | Freq: Two times a day (BID) | ORAL | Status: DC

## 2011-11-03 NOTE — Telephone Encounter (Signed)
Refill sent to local pharmacy until mail order and supply her with medication,

## 2011-11-18 ENCOUNTER — Other Ambulatory Visit: Payer: Self-pay | Admitting: Internal Medicine

## 2011-11-18 NOTE — Telephone Encounter (Signed)
Addended by: Gilmer Mor on: 11/18/2011 05:26 PM   Modules accepted: Orders

## 2011-11-18 NOTE — Telephone Encounter (Signed)
Medication refill Nexium 40 mg, 2 daily, w/ 90 day supply Pharmacy- [facility]- [phone]

## 2011-11-26 ENCOUNTER — Ambulatory Visit: Payer: Worker's Compensation | Admitting: Physical Medicine & Rehabilitation

## 2011-11-26 ENCOUNTER — Encounter: Payer: Worker's Compensation | Attending: Physical Medicine & Rehabilitation

## 2011-11-26 DIAGNOSIS — G894 Chronic pain syndrome: Secondary | ICD-10-CM

## 2011-11-26 DIAGNOSIS — G90519 Complex regional pain syndrome I of unspecified upper limb: Secondary | ICD-10-CM

## 2011-11-30 NOTE — Assessment & Plan Note (Signed)
REASON FOR VISIT:  RSD, left upper extremity.  A 46 year old female with history of left wrist sprain in 1990s while working in [place].  She was diagnosed and treated for RSD, followed in our clinic for number of years.  She is able to come off morphine and has a replacement regimen that consists of the following medications: 1. Cymbalta 30 mg 3 p.o. daily. 2. Topamax 100 mg q.a.m. 3. Topamax 200 mg at bedtime. 4. Tizanidine 4 mg 1/2 p.o. b.i.d., 1 p.o. at bedtime. 5. Lidoderm patch 5% up to 3 patches at night. 6. Tramadol 50 mg at bedtime. 7. Ultram ER 100 mg p.o. daily. 8. Motrin 600 mg p.o. t.i.d., this was reduced from 800 without     difficulty. 9. Hydrocodone 7.5/500 one p.o. at bedtime.  She has tried to go     without the hydrocodone or with the tramadol 50 mg at night, but     this was causing her to have difficulties with sleep.  REVIEW OF SYSTEMS:  Numbness, tremor, tingling, spasms, dizziness, nausea, diarrhea, constipation, limb swelling, shortness of breath, sleep problems, weight gain.  SOCIAL HISTORY:  Her husband lost her job.  She is stressed out.  She went to the ED for chest pain, noncardiac, later diagnosed with a pleuritic chest pain.  PHYSICAL EXAMINATION:  VITAL SIGNS:  Blood pressure 153/93, pulse 81, respirations 16, O2 sat 95% on room air, anxious female. GENERAL:  She is frustrated, trying to get some assistance in terms of healthcare insurance for her family as well as some other assistance since her husband lost his job, weight 226 pounds, height 5 feet 8 inches. 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  No skin or nail changes. MUSCULOSKELETAL:  She has decreased range of motion on active range due to pain in the left wrist and finger flexors and extensors.  Elbow has some limitation range of motion actively, although full passively. Right upper extremity, no problems.  Gait is normal. NEUROLOGIC:  Mood and affect are  appropriate.  IMPRESSION: 1. Reflex sympathetic dystrophy, left upper extremity. 2. Chronic pain syndrome.  PLAN:  We will continue the current medications except reduce the Motrin to 400 mg t.i.d.  I will see her back in 3 months.  Consider going down further on the Motrin at that time.  We did address potentially looking at other medications,  we could trim after we addressed some Motrin.  We may look at her tramadol switching just to the 50 b.i.d. rather than the Ultram ER plus tramadol.     [name], M.D. Electronically Signed    AEK/MedQ D:  11/26/2011 10:39:27  T:  11/26/2011 21:04:51  Job #:  [id number]

## 2011-12-07 ENCOUNTER — Other Ambulatory Visit: Payer: Self-pay

## 2011-12-07 DIAGNOSIS — K219 Gastro-esophageal reflux disease without esophagitis: Secondary | ICD-10-CM

## 2011-12-07 MED ORDER — ESOMEPRAZOLE MAGNESIUM 40 MG PO CPDR: 40.0000 mg | DELAYED_RELEASE_CAPSULE | Freq: Two times a day (BID) | ORAL | Status: DC

## 2011-12-30 ENCOUNTER — Telehealth: Payer: Self-pay | Admitting: Physical Medicine & Rehabilitation

## 2011-12-30 ENCOUNTER — Other Ambulatory Visit: Payer: Self-pay | Admitting: *Deleted

## 2011-12-30 NOTE — Telephone Encounter (Signed)
[facility]  No refills left.

## 2011-12-31 ENCOUNTER — Telehealth: Payer: Self-pay | Admitting: Physical Medicine & Rehabilitation

## 2011-12-31 MED ORDER — TRAMADOL HCL ER 100 MG PO TB24
100.0000 mg | ORAL_TABLET | Freq: Every day | ORAL | Status: DC
Start: 2011-12-31 — End: 2012-03-28

## 2011-12-31 MED ORDER — TRAMADOL HCL 50 MG PO TABS: 50.0000 mg | ORAL_TABLET | Freq: Every day | ORAL | Status: DC

## 2011-12-31 MED ORDER — HYDROCODONE-ACETAMINOPHEN 7.5-500 MG PO TABS: 1.0000 | ORAL_TABLET | Freq: Every evening | ORAL | Status: DC | PRN

## 2011-12-31 NOTE — Telephone Encounter (Signed)
Patient having problems with scripts.  No refills on hydrocodone and they do not have Tramadol on record.  Please help.

## 2011-12-31 NOTE — Telephone Encounter (Signed)
Verified pharmacy, fax #, and meds needing refilled by phone w/ Mickel Baas; refills completed.

## 2012-01-08 ENCOUNTER — Other Ambulatory Visit: Payer: Self-pay | Admitting: Internal Medicine

## 2012-01-31 ENCOUNTER — Telehealth: Payer: Self-pay | Admitting: Physical Medicine & Rehabilitation

## 2012-01-31 MED ORDER — HYDROCODONE-ACETAMINOPHEN 7.5-500 MG PO TABS: 1.0000 | ORAL_TABLET | Freq: Every evening | ORAL | Status: DC | PRN

## 2012-01-31 MED ORDER — IBUPROFEN 600 MG PO TABS: 600.0000 mg | ORAL_TABLET | Freq: Three times a day (TID) | ORAL | Status: DC

## 2012-01-31 NOTE — Telephone Encounter (Signed)
Rx called/sent in, pt aware.

## 2012-01-31 NOTE — Telephone Encounter (Signed)
Hydrocodone needs new script.  There is no script for the Ibuprofen.

## 2012-02-03 ENCOUNTER — Telehealth: Payer: Self-pay | Admitting: *Deleted

## 2012-02-03 MED ORDER — PANTOPRAZOLE SODIUM 40 MG PO TBEC: 40.0000 mg | DELAYED_RELEASE_TABLET | Freq: Every day | ORAL | Status: DC

## 2012-02-03 NOTE — Telephone Encounter (Signed)
Go ahead and send Rx for pantoprazole 43m daily #90 x 3

## 2012-02-03 NOTE — Telephone Encounter (Signed)
rx sent to pharmacy by [facility] Spoke with patient and advised results

## 2012-02-03 NOTE — Telephone Encounter (Signed)
Called pt because we received a letter from [facility] that pt's workers [employer] will no longer cover brand name nexium, the alternatives are OMEPRAZOLE, LANSOPRAZOLE & PANTOPRAZOLE, per pt she wants whichever is better, per Dr. Silvio Pate recommendation, [facility]. Please advise

## 2012-02-22 ENCOUNTER — Ambulatory Visit (HOSPITAL_BASED_OUTPATIENT_CLINIC_OR_DEPARTMENT_OTHER): Payer: Worker's Compensation | Admitting: Physical Medicine & Rehabilitation

## 2012-02-22 ENCOUNTER — Encounter: Payer: Self-pay | Admitting: Physical Medicine & Rehabilitation

## 2012-02-22 ENCOUNTER — Encounter: Payer: Worker's Compensation | Attending: Physical Medicine & Rehabilitation

## 2012-02-22 VITALS — BP 160/96 | HR 93 | Resp 16 | Ht 68.0 in | Wt 218.4 lb

## 2012-02-22 DIAGNOSIS — M79673 Pain in unspecified foot: Secondary | ICD-10-CM

## 2012-02-22 DIAGNOSIS — M79609 Pain in unspecified limb: Secondary | ICD-10-CM

## 2012-02-22 DIAGNOSIS — G9050 Complex regional pain syndrome I, unspecified: Secondary | ICD-10-CM

## 2012-02-22 DIAGNOSIS — G90519 Complex regional pain syndrome I of unspecified upper limb: Secondary | ICD-10-CM | POA: Insufficient documentation

## 2012-02-22 MED ORDER — HYDROCODONE-ACETAMINOPHEN 7.5-500 MG PO TABS: 1.0000 | ORAL_TABLET | Freq: Every evening | ORAL | Status: DC | PRN

## 2012-02-22 NOTE — Patient Instructions (Signed)
Please see orthopedics for your foot pain

## 2012-02-22 NOTE — Progress Notes (Signed)
  Subjective:    Patient ID: [name], female    DOB: August 22, 1966, 46 y.o.   MRN: [id number]  HPI Complaining of left greater than right foot pain. Using some pain medicine in the morning rather than at night for this. Continues to have left wrist arm hand sensitivity and pain. No new medical issues. No recent surgeries. No new medications except blood pressure medicine has been increased to twice a day did not take blood pressure medicines this morning. Pain Inventory Average Pain 7 Pain Right Now 7 My pain is intermittent, constant, sharp, burning and every day is different  In the last 24 hours, has pain interfered with the following? General activity 8 Relation with others 8 Enjoyment of life 8 What TIME of day is your pain at its worst? depends on activity Sleep (in general) Poor  Pain is worse with: walking, bending, sitting and standing Pain improves with: rest, therapy/exercise, pacing activities and medication Relief from Meds: 5  Mobility walk without assistance how many minutes can you walk? 20 ability to climb steps?  yes do you drive?  yes  Function disabled: date disabled 09/1993 I need assistance with the following:  meal prep, household duties and shopping  Neuro/Psych weakness numbness tremor tingling spasms dizziness anxiety  Prior Studies Any changes since last visit?  no  Physicians involved in your care Any changes since last visit?  no     Review of Systems  Constitutional: Positive for diaphoresis and unexpected weight change.       Night sweats and weight gain  Respiratory: Positive for apnea.   Cardiovascular: Positive for leg swelling.  Gastrointestinal: Positive for constipation.  Musculoskeletal:       Spasms  Neurological: Positive for dizziness, tremors, weakness and numbness.  Psychiatric/Behavioral: The patient is nervous/anxious.   All other systems reviewed and are negative.       Objective:   Physical Exam   Constitutional: She is oriented to person, place, and time. She appears well-developed and well-nourished.  HENT:  Head: Normocephalic and atraumatic.  Neurological: She is alert and oriented to person, place, and time. A sensory deficit is present.       Left upper extremity 4 minus/5 in the left deltoid, biceps, triceps, grip Left lower extremity is 4/5 in the left ankle dorsiflexor otherwise 5/5. Pain in addition in the left foot. Per sensitivity to touch in the left upper extremity as well as the left foot. Pulses are normal in the lower extremities.   Psychiatric: She has a normal mood and affect.          Assessment & Plan:  1. Reflex sympathetic dystrophy left upper extremity. A work related injury in the 1990s. Continue current medication management.  2. Lower remedy pain in the foot. Diagnosis includes metatarsalgia, possible radiculopathy. She'll followup with orthopedics with this.

## 2012-03-04 ENCOUNTER — Other Ambulatory Visit: Payer: Self-pay | Admitting: Internal Medicine

## 2012-03-07 ENCOUNTER — Telehealth: Payer: Self-pay

## 2012-03-07 NOTE — Telephone Encounter (Signed)
[facility] needed clarification on medications.  Pt says she is taking her friends medications as needed.

## 2012-03-08 ENCOUNTER — Telehealth: Payer: Self-pay | Admitting: Physical Medicine & Rehabilitation

## 2012-03-08 NOTE — Telephone Encounter (Signed)
WC has changed again.  Now all Rx is to go to [facility] - [phone]

## 2012-03-27 ENCOUNTER — Telehealth: Payer: Self-pay

## 2012-03-27 ENCOUNTER — Other Ambulatory Visit: Payer: Self-pay | Admitting: Internal Medicine

## 2012-03-27 NOTE — Telephone Encounter (Signed)
Pts insurance has changed again and needs all of her medication [facility] [place].  Pt did not specify medications.

## 2012-03-27 NOTE — Telephone Encounter (Signed)
Ok to refill 

## 2012-03-27 NOTE — Telephone Encounter (Signed)
Okay to refill for a year She is due for physical anytime after 08/01/12 Have her set up appt within 6 months or so

## 2012-03-27 NOTE — Telephone Encounter (Signed)
rx sent to pharmacy by [facility] .left message to have patient return my call.

## 2012-03-28 ENCOUNTER — Other Ambulatory Visit: Payer: Self-pay

## 2012-03-28 MED ORDER — LIDOCAINE 5 % EX PTCH: 1.0000 | MEDICATED_PATCH | CUTANEOUS | Status: DC

## 2012-03-28 MED ORDER — TRAMADOL HCL ER 100 MG PO TB24: 100.0000 mg | ORAL_TABLET | Freq: Every day | ORAL | Status: DC

## 2012-03-28 MED ORDER — TRAMADOL HCL 50 MG PO TABS: 50.0000 mg | ORAL_TABLET | Freq: Every day | ORAL | Status: DC

## 2012-03-28 MED ORDER — TOPIRAMATE 200 MG PO TABS: 200.0000 mg | ORAL_TABLET | Freq: Every day | ORAL | Status: DC

## 2012-03-28 MED ORDER — DULOXETINE HCL 30 MG PO CPEP: 30.0000 mg | ORAL_CAPSULE | Freq: Two times a day (BID) | ORAL | Status: DC

## 2012-03-28 MED ORDER — TIZANIDINE HCL 4 MG PO TABS: 4.0000 mg | ORAL_TABLET | Freq: Four times a day (QID) | ORAL | Status: DC

## 2012-03-28 MED ORDER — HYDROCODONE-ACETAMINOPHEN 7.5-500 MG PO TABS: 1.0000 | ORAL_TABLET | Freq: Every evening | ORAL | Status: DC | PRN

## 2012-03-28 MED ORDER — IBUPROFEN 600 MG PO TABS: 600.0000 mg | ORAL_TABLET | Freq: Three times a day (TID) | ORAL | Status: DC

## 2012-03-28 NOTE — Telephone Encounter (Signed)
Meds have been sent in per pt request to [facility]. She is aware that this has been taken care of.

## 2012-05-01 ENCOUNTER — Other Ambulatory Visit: Payer: Self-pay | Admitting: *Deleted

## 2012-05-01 MED ORDER — HYDROCODONE-ACETAMINOPHEN 7.5-500 MG PO TABS: 1.0000 | ORAL_TABLET | Freq: Every evening | ORAL | Status: DC | PRN

## 2012-05-04 ENCOUNTER — Encounter: Payer: Self-pay | Admitting: Obstetrics & Gynecology

## 2012-05-04 ENCOUNTER — Ambulatory Visit (INDEPENDENT_AMBULATORY_CARE_PROVIDER_SITE_OTHER): Payer: Medicaid Other | Admitting: Obstetrics & Gynecology

## 2012-05-04 VITALS — BP 162/95 | HR 81 | Ht 68.0 in | Wt 217.0 lb

## 2012-05-04 DIAGNOSIS — Z0000 Encounter for general adult medical examination without abnormal findings: Secondary | ICD-10-CM

## 2012-05-04 DIAGNOSIS — Z302 Encounter for sterilization: Secondary | ICD-10-CM

## 2012-05-04 DIAGNOSIS — R102 Pelvic and perineal pain: Secondary | ICD-10-CM

## 2012-05-04 DIAGNOSIS — R32 Unspecified urinary incontinence: Secondary | ICD-10-CM

## 2012-05-04 NOTE — Progress Notes (Signed)
Subjective:    [name] is a 46 y.o. female who presents for an annual exam. She has 2 complaints. 1) 1 year h/o dysparunia, "stabbing" pain with deep insertion. 2) renewed GSUI for the last year.  The patient is sexually active. (rarely due to the dysparunia) GYN screening history: last pap: was normal. The patient wears seatbelts: yes. The patient participates in regular exercise: no. Has the patient ever been transfused or tattooed?: no. The patient reports that there is not domestic violence in her life.   Menstrual History: OB History    Grav Para Term Preterm Abortions TAB SAB Ect Mult Living                  Menarche age: 56 No LMP recorded. Patient has had a hysterectomy.    The following portions of the patient's history were reviewed and updated as appropriate: allergies, current medications, past family history, past medical history, past social history, past surgical history and problem list.  Review of Systems A comprehensive review of systems was negative. She has been with her husband for 26 years. They are both currently unemployed.   Objective:    BP 162/95  Pulse 81  Ht 5' 8"  (1.727 m)  Wt 217 lb (98.431 kg)  BMI 32.99 kg/m2  General Appearance:    Alert, cooperative, no distress, appears stated age  Head:    Normocephalic, without obvious abnormality, atraumatic  Eyes:    PERRL, conjunctiva/corneas clear, EOM's intact, fundi    benign, both eyes  Ears:    Normal TM's and external ear canals, both ears  Nose:   Nares normal, septum midline, mucosa normal, no drainage    or sinus tenderness  Throat:   Lips, mucosa, and tongue normal; teeth and gums normal  Neck:   Supple, symmetrical, trachea midline, no adenopathy;    thyroid:  no enlargement/tenderness/nodules; no carotid   bruit or JVD  Back:     Symmetric, no curvature, ROM normal, no CVA tenderness  Lungs:     Clear to auscultation bilaterally, respirations unlabored  Chest Wall:    No  tenderness or deformity   Heart:    Regular rate and rhythm, S1 and S2 normal, no murmur, rub   or gallop  Breast Exam:    No tenderness, masses, or nipple abnormality  Abdomen:     Soft, non-tender, bowel sounds active all four quadrants,    no masses, no organomegaly  Genitalia:    Normal female without lesion, discharge or tenderness, No cystocele or rectocele. I am unable to elicit the same pain she has with IC. Her right ovary is palpable, but NT and not enlarged.     Extremities:   Extremities normal, atraumatic, no cyanosis or edema  Pulses:   2+ and symmetric all extremities  Skin:   Skin color, texture, turgor normal, no rashes or lesions  Lymph nodes:   Cervical, supraclavicular, and axillary nodes normal  Neurologic:   CNII-XII intact, normal strength, sensation and reflexes    throughout  .    Assessment:    Healthy female exam.  Incontinence (status post sling in the distant past) dysparunia   Plan:     Mammogram.  Urology consult Pelvic ultrasound

## 2012-05-04 NOTE — Progress Notes (Signed)
Patient is here for yearly exam, her husband has lost his job so they have lost there insurance.

## 2012-05-19 ENCOUNTER — Encounter
Payer: Worker's Compensation | Attending: Physical Medicine and Rehabilitation | Admitting: Physical Medicine and Rehabilitation

## 2012-05-19 ENCOUNTER — Encounter: Payer: Self-pay | Admitting: Physical Medicine and Rehabilitation

## 2012-05-19 VITALS — BP 148/82 | HR 91 | Resp 14 | Wt 217.0 lb

## 2012-05-19 DIAGNOSIS — G90519 Complex regional pain syndrome I of unspecified upper limb: Secondary | ICD-10-CM | POA: Insufficient documentation

## 2012-05-19 DIAGNOSIS — I10 Essential (primary) hypertension: Secondary | ICD-10-CM | POA: Insufficient documentation

## 2012-05-19 DIAGNOSIS — K219 Gastro-esophageal reflux disease without esophagitis: Secondary | ICD-10-CM | POA: Insufficient documentation

## 2012-05-19 DIAGNOSIS — G9050 Complex regional pain syndrome I, unspecified: Secondary | ICD-10-CM

## 2012-05-19 DIAGNOSIS — E785 Hyperlipidemia, unspecified: Secondary | ICD-10-CM | POA: Insufficient documentation

## 2012-05-19 DIAGNOSIS — R209 Unspecified disturbances of skin sensation: Secondary | ICD-10-CM | POA: Insufficient documentation

## 2012-05-19 DIAGNOSIS — E669 Obesity, unspecified: Secondary | ICD-10-CM | POA: Insufficient documentation

## 2012-05-19 DIAGNOSIS — M79673 Pain in unspecified foot: Secondary | ICD-10-CM

## 2012-05-19 DIAGNOSIS — G4730 Sleep apnea, unspecified: Secondary | ICD-10-CM | POA: Insufficient documentation

## 2012-05-19 DIAGNOSIS — M79609 Pain in unspecified limb: Secondary | ICD-10-CM | POA: Insufficient documentation

## 2012-05-19 DIAGNOSIS — G8929 Other chronic pain: Secondary | ICD-10-CM | POA: Insufficient documentation

## 2012-05-19 DIAGNOSIS — I2510 Atherosclerotic heart disease of native coronary artery without angina pectoris: Secondary | ICD-10-CM | POA: Insufficient documentation

## 2012-05-19 MED ORDER — TRAMADOL HCL 50 MG PO TABS: 50.0000 mg | ORAL_TABLET | Freq: Every day | ORAL | Status: DC

## 2012-05-19 MED ORDER — TRAMADOL HCL ER 100 MG PO TB24: 100.0000 mg | ORAL_TABLET | Freq: Every day | ORAL | Status: DC

## 2012-05-19 NOTE — Patient Instructions (Signed)
Continue with your exercising in the pool. 

## 2012-05-19 NOTE — Progress Notes (Signed)
Subjective:    Patient ID: [name], female    DOB: 1966-09-17, 46 y.o.   MRN: [id number]  HPI The patient complains about chronic left arm pain, after an accident at her workplace Dec. 1994. The patient also complains about numbness and tingling in her left arm. The problem has been stable, controlled by medications most of the time.  Pain Inventory Average Pain 7 Pain Right Now 7 My pain is constant, sharp, burning, stabbing, tingling and aching  In the last 24 hours, has pain interfered with the following? General activity 7 Relation with others 7 Enjoyment of life 7 What TIME of day is your pain at its worst? all of the time Sleep (in general) Poor  Pain is worse with: walking, bending, sitting, inactivity, standing and some activites Pain improves with: rest, heat/ice, therapy/exercise, pacing activities, medication and TENS Relief from Meds: depends on the day, some days 50% and some days hardly any relief  Mobility walk without assistance ability to climb steps?  yes do you drive?  yes  Function disabled: date disabled 40 I need assistance with the following:  meal prep, household duties and shopping  Neuro/Psych weakness numbness tremor tingling spasms dizziness loss of taste or smell  Prior Studies Any changes since last visit?  no  Physicians involved in your care Any changes since last visit?  no   Family History  Problem Relation Age of Onset  . Cancer Mother     lung  . Hypertension Mother   . Cancer Father     colon  . Hypertension Sister   . Birth defects Maternal Grandmother     breast  . Birth defects Paternal Grandmother     uterine, stomach, lung   History   Social History  . Marital Status: Married    Spouse Name: N/A    Number of Children: 2  . Years of Education: N/A   Occupational History  . Disabled due to RSD    Social History Main Topics  . Smoking status: Never Smoker   . Smokeless tobacco: Never Used  .  Alcohol Use: No  . Drug Use: No  . Sexually Active: Yes   Other Topics Concern  . None   Social History Narrative  . None   Past Surgical History  Procedure Date  . Cesarean section   . Tonsillectomy   . Nasal septal repair   . Abdominal hysterectomy   . Appendectomy   . Bladder lift 10/18/2005  . Oophorectomy 10/18/2006    right   Past Medical History  Diagnosis Date  . Anxiety   . Asthma   . History of colonic polyps   . GERD (gastroesophageal reflux disease)   . Reflex sympathetic dystrophy   . Migraines   . Sleep apnea   . Hypertension   . Allergy   . Hyperlipidemia   . ASCVD (arteriosclerotic cardiovascular disease)   . Obesity (BMI 30.0-34.9)    Pulse 91  Resp 14  Wt 217 lb (98.431 kg)  SpO2 97%    Review of Systems  Constitutional: Positive for diaphoresis.       Loss of taste and smell  Gastrointestinal: Positive for constipation.  Musculoskeletal:       Spasms  Skin: Positive for rash.  Neurological: Positive for dizziness, tremors, weakness and numbness.       Tingling  All other systems reviewed and are negative.       Objective:   Physical Exam  Constitutional: She is  oriented to person, place, and time. She appears well-developed and well-nourished.  HENT:  Head: Normocephalic.  Neck: Neck supple.  Musculoskeletal: She exhibits tenderness.  Neurological: She is alert and oriented to person, place, and time.  Skin: Skin is warm and dry.  Psychiatric: She has a normal mood and affect.    Symmetric normal motor tone is noted throughout. Normal muscle bulk. Muscle testing reveals 5/5 muscle strength of the upper extremity, except left UE patient is very hesitant to move against any resistance because of fear of pain, could not test completely, and 5/5 of the lower extremity. Full range of motion in upper and lower extremities. ROM of spine is not restricted.  DTR in the upper and lower extremity are present and symmetric 2+. No clonus is  noted.  Patient arises from chair without difficulty. Narrow based gait with normal arm swing bilateral , able to walk on heels and toes . Tandem walk is stable. No pronator drift. Rhomberg negative.         Assessment & Plan:  1. Reflex sympathetic dystrophy left upper extremity. A work related injury in the 1990s. Continue current medication management.  2. Lower remedy pain in the left foot. PLAN Patient is managing her symptoms with exercising in the pool and on land and with medication.Encourage patient to continue with her activities.

## 2012-05-23 ENCOUNTER — Encounter: Payer: Worker's Compensation | Admitting: Physical Medicine and Rehabilitation

## 2012-05-29 ENCOUNTER — Other Ambulatory Visit: Payer: Self-pay | Admitting: *Deleted

## 2012-05-29 MED ORDER — HYDROCODONE-ACETAMINOPHEN 7.5-500 MG PO TABS: 1.0000 | ORAL_TABLET | Freq: Every evening | ORAL | Status: DC | PRN

## 2012-06-05 ENCOUNTER — Other Ambulatory Visit (HOSPITAL_COMMUNITY): Payer: Self-pay

## 2012-06-05 ENCOUNTER — Ambulatory Visit (HOSPITAL_COMMUNITY): Payer: Self-pay

## 2012-06-05 ENCOUNTER — Ambulatory Visit (HOSPITAL_COMMUNITY): Payer: Medicaid Other

## 2012-06-26 ENCOUNTER — Other Ambulatory Visit: Payer: Self-pay | Admitting: *Deleted

## 2012-06-26 ENCOUNTER — Other Ambulatory Visit: Payer: Self-pay | Admitting: Physical Medicine & Rehabilitation

## 2012-06-26 MED ORDER — HYDROCODONE-ACETAMINOPHEN 7.5-500 MG PO TABS: 1.0000 | ORAL_TABLET | Freq: Every evening | ORAL | Status: DC | PRN

## 2012-07-03 ENCOUNTER — Ambulatory Visit (HOSPITAL_COMMUNITY): Payer: Medicaid Other

## 2012-07-03 ENCOUNTER — Ambulatory Visit (HOSPITAL_COMMUNITY): Payer: Self-pay

## 2012-07-13 ENCOUNTER — Telehealth: Payer: Self-pay

## 2012-07-13 DIAGNOSIS — K219 Gastro-esophageal reflux disease without esophagitis: Secondary | ICD-10-CM

## 2012-07-13 MED ORDER — ESOMEPRAZOLE MAGNESIUM 40 MG PO CPDR: 40.0000 mg | DELAYED_RELEASE_CAPSULE | Freq: Two times a day (BID) | ORAL | Status: DC

## 2012-07-13 NOTE — Telephone Encounter (Signed)
Patient informed. 

## 2012-07-13 NOTE — Telephone Encounter (Signed)
Patient call.  Did not leave message because was not asked to.

## 2012-07-13 NOTE — Telephone Encounter (Signed)
May call in nexium 35m BID #[id number] [id number]

## 2012-07-13 NOTE — Telephone Encounter (Signed)
Can take over her nexium 75m 2 a day script.  Her workers [employer] accept it from her PCP any longer.  [facility]  Please advise.

## 2012-07-24 ENCOUNTER — Other Ambulatory Visit: Payer: Self-pay | Admitting: Physical Medicine & Rehabilitation

## 2012-07-25 ENCOUNTER — Other Ambulatory Visit: Payer: Self-pay | Admitting: Physical Medicine & Rehabilitation

## 2012-08-18 ENCOUNTER — Encounter: Payer: Worker's Compensation | Admitting: Physical Medicine and Rehabilitation

## 2012-08-21 ENCOUNTER — Ambulatory Visit (HOSPITAL_COMMUNITY): Payer: Self-pay

## 2012-08-24 ENCOUNTER — Other Ambulatory Visit: Payer: Self-pay | Admitting: Physical Medicine & Rehabilitation

## 2012-08-28 ENCOUNTER — Other Ambulatory Visit: Payer: Self-pay | Admitting: Physical Medicine & Rehabilitation

## 2012-09-07 ENCOUNTER — Encounter: Payer: Self-pay | Admitting: Physical Medicine and Rehabilitation

## 2012-09-07 ENCOUNTER — Encounter: Payer: Worker's Compensation | Admitting: Physical Medicine and Rehabilitation

## 2012-09-07 ENCOUNTER — Encounter
Payer: Worker's Compensation | Attending: Physical Medicine and Rehabilitation | Admitting: Physical Medicine and Rehabilitation

## 2012-09-07 VITALS — BP 152/95 | HR 95 | Resp 18 | Ht 68.0 in | Wt 204.2 lb

## 2012-09-07 DIAGNOSIS — G9050 Complex regional pain syndrome I, unspecified: Secondary | ICD-10-CM

## 2012-09-07 DIAGNOSIS — G8929 Other chronic pain: Secondary | ICD-10-CM | POA: Insufficient documentation

## 2012-09-07 DIAGNOSIS — R209 Unspecified disturbances of skin sensation: Secondary | ICD-10-CM | POA: Insufficient documentation

## 2012-09-07 DIAGNOSIS — G90519 Complex regional pain syndrome I of unspecified upper limb: Secondary | ICD-10-CM | POA: Insufficient documentation

## 2012-09-07 DIAGNOSIS — M79609 Pain in unspecified limb: Secondary | ICD-10-CM | POA: Insufficient documentation

## 2012-09-07 MED ORDER — METHYLPREDNISOLONE 4 MG PO KIT: PACK | ORAL | Status: DC

## 2012-09-07 NOTE — Progress Notes (Signed)
Subjective:    Patient ID: [name], female    DOB: 1966/04/18, 46 y.o.   MRN: [id number]  HPI The patient complains about chronic left arm pain, after an accident at her workplace Dec. 1994. The patient also complains about numbness and tingling in her left arm.  The problem is  controlled by medications most of the time, but since last couple of weeks the patient has a flare up of her symptoms, and is hurting all over. She also states, that she can not use her pool anymore, and not being able to do her aquatic exercises has increased her symptoms too.   Pain Inventory Average Pain 10 Pain Right Now 10 My pain is constant, sharp, burning, stabbing, tingling and aching  In the last 24 hours, has pain interfered with the following? General activity 9 Relation with others 9 Enjoyment of life 9 What TIME of day is your pain at its worst? all the time Sleep (in general) Poor  Pain is worse with: walking Pain improves with: medication Relief from Meds: 5  Mobility walk without assistance how many minutes can you walk? unsure ability to climb steps?  yes do you drive?  yes Do you have any goals in this area?  yes  Function disabled: date disabled  I need assistance with the following:  meal prep, household duties and shopping  Neuro/Psych weakness numbness tremor tingling spasms dizziness depression anxiety  Prior Studies Any changes since last visit?  no  Physicians involved in your care Any changes since last visit?  no   Family History  Problem Relation Age of Onset  . Cancer Mother     lung  . Hypertension Mother   . Cancer Father     colon  . Hypertension Sister   . Birth defects Maternal Grandmother     breast  . Birth defects Paternal Grandmother     uterine, stomach, lung   History   Social History  . Marital Status: Married    Spouse Name: N/A    Number of Children: 2  . Years of Education: N/A   Occupational History  . Disabled due  to RSD    Social History Main Topics  . Smoking status: Never Smoker   . Smokeless tobacco: Never Used  . Alcohol Use: No  . Drug Use: No  . Sexually Active: Yes   Other Topics Concern  . None   Social History Narrative  . None   Past Surgical History  Procedure Date  . Cesarean section   . Tonsillectomy   . Nasal septal repair   . Abdominal hysterectomy   . Appendectomy   . Bladder lift 10/18/2005  . Oophorectomy 10/18/2006    right   Past Medical History  Diagnosis Date  . Anxiety   . Asthma   . History of colonic polyps   . GERD (gastroesophageal reflux disease)   . Reflex sympathetic dystrophy   . Migraines   . Sleep apnea   . Hypertension   . Allergy   . Hyperlipidemia   . ASCVD (arteriosclerotic cardiovascular disease)   . Obesity (BMI 30.0-34.9)    BP 152/95  Pulse 95  Resp 18  Ht 5\' 8"  (1.727 m)  Wt 204 lb 3.2 oz (92.625 kg)  BMI 31.05 kg/m2  SpO2 97%    Review of Systems  Respiratory: Positive for cough.   Cardiovascular: Positive for leg swelling.  Gastrointestinal: Positive for constipation.  Musculoskeletal: Positive for myalgias, back pain and arthralgias.  Neurological: Positive for dizziness, tremors, weakness and numbness.       Spasms and tingling  Psychiatric/Behavioral: Positive for dysphoric mood. The patient is nervous/anxious.   All other systems reviewed and are negative.       Objective:   Physical Exam Constitutional: She is oriented to person, place, and time. She appears well-developed and well-nourished.  HENT:  Head: Normocephalic.  Neck: Neck supple.  Musculoskeletal: She exhibits extreme tenderness, even to minimal touch.  Neurological: She is alert and oriented to person, place, and time.  Skin: Skin is warm and dry.  Psychiatric: She has a normal mood and affect.   Symmetric normal motor tone is noted throughout. Normal muscle bulk. Muscle testing reveals 5/5 muscle strength of the upper extremity, except left  UE patient is very hesitant to move against any resistance because of fear of pain, could not test completely, and 5/5 of the lower extremity. Full range of motion in upper and lower extremities, except left wrist and fingers, restrited motion mainly because of severe pain. ROM of spine is not restricted.  DTR in the upper and lower extremity are present and symmetric 2+. No clonus is noted.  Patient arises from chair without difficulty. Narrow based gait with normal arm swing bilateral , able to walk on heels and toes . Tandem walk is stable. No pronator drift. Rhomberg negative.        Assessment & Plan:  1.Recent flare up of Reflex sympathetic dystrophy left upper extremity. A work related injury in the 1990s. Continue current medication management. Prescribed steroid dose pack , which has helped in the past. Last one was around one year ago. 2. Lower remedy pain in the left foot. PLAN  Patient is managing her symptoms with exercising in the pool and on land and with medication. Unfortunately the patient can not continue with her aquatic exercises, because it is too cold in her outdoor pool, not being able to do her aquatic exercises have contributed to he increased symptoms. It would be very beneficial for her to continue her aquatic exercises in an indoor pool in a gym, advised patient to find out whether [facility] would pay for aquatic exercising in a gym, I would recommend this. Follow up in 3 month.

## 2012-09-07 NOTE — Patient Instructions (Addendum)
Continue with your exercises, as pain permits. Contact your adjuster, I think exercising in the pool is very beneficial for you, ask whether they would pay for aquatic exercising in a gym.

## 2012-09-08 ENCOUNTER — Ambulatory Visit: Payer: Worker's Compensation | Admitting: Physical Medicine and Rehabilitation

## 2012-09-08 ENCOUNTER — Other Ambulatory Visit: Payer: Self-pay | Admitting: Physical Medicine & Rehabilitation

## 2012-09-08 NOTE — Addendum Note (Signed)
Addended by: Claiborne Rigg D on: 09/08/2012 01:03 PM   Modules accepted: Orders

## 2012-09-18 ENCOUNTER — Other Ambulatory Visit: Payer: Self-pay | Admitting: Physical Medicine & Rehabilitation

## 2012-09-22 ENCOUNTER — Other Ambulatory Visit: Payer: Self-pay | Admitting: Physical Medicine & Rehabilitation

## 2012-10-07 ENCOUNTER — Other Ambulatory Visit: Payer: Self-pay | Admitting: Physical Medicine & Rehabilitation

## 2012-10-14 ENCOUNTER — Other Ambulatory Visit: Payer: Self-pay | Admitting: Physical Medicine & Rehabilitation

## 2012-10-26 ENCOUNTER — Telehealth: Payer: Self-pay | Admitting: *Deleted

## 2012-10-26 MED ORDER — HYDROCODONE-ACETAMINOPHEN 7.5-500 MG PO TABS: 1.0000 | ORAL_TABLET | Freq: Every day | ORAL | Status: DC

## 2012-10-26 NOTE — Telephone Encounter (Signed)
[facility] Patient aware.

## 2012-10-26 NOTE — Telephone Encounter (Signed)
Patient needs refill on Hydrocodone °

## 2012-10-30 ENCOUNTER — Other Ambulatory Visit: Payer: Self-pay | Admitting: Internal Medicine

## 2012-10-30 NOTE — Telephone Encounter (Signed)
Refill denied last seen Dr.Letvak 2012

## 2012-11-05 ENCOUNTER — Other Ambulatory Visit: Payer: Self-pay | Admitting: Physical Medicine & Rehabilitation

## 2012-11-08 ENCOUNTER — Other Ambulatory Visit (HOSPITAL_COMMUNITY): Payer: Self-pay

## 2012-11-21 ENCOUNTER — Other Ambulatory Visit: Payer: Self-pay | Admitting: Physical Medicine and Rehabilitation

## 2012-11-21 NOTE — Telephone Encounter (Signed)
Tramadol refill.  Please advise.

## 2012-11-24 ENCOUNTER — Telehealth: Payer: Self-pay

## 2012-11-24 NOTE — Telephone Encounter (Signed)
[facility]ill but dose needs changed due to 500 no longer being available.  Please advise.

## 2012-11-27 ENCOUNTER — Telehealth: Payer: Self-pay | Admitting: *Deleted

## 2012-11-27 ENCOUNTER — Telehealth: Payer: Self-pay

## 2012-11-27 MED ORDER — HYDROCODONE-ACETAMINOPHEN 7.5-500 MG PO TABS: 1.0000 | ORAL_TABLET | Freq: Every day | ORAL | Status: DC

## 2012-11-27 MED ORDER — TRAMADOL HCL ER 100 MG PO TB24: 100.0000 mg | ORAL_TABLET | Freq: Every day | ORAL | Status: DC

## 2012-11-27 MED ORDER — TRAMADOL HCL 50 MG PO TABS: 50.0000 mg | ORAL_TABLET | Freq: Every day | ORAL | Status: DC

## 2012-11-27 NOTE — Telephone Encounter (Signed)
Refill tramadol(s) and hydrocodone.

## 2012-11-27 NOTE — Telephone Encounter (Signed)
[facility] hydro[facility]one no longer comes in 500mg .  Please send new order.  Per Dr Wynn Banker ok to change to 325.  Patient also called but said the pharmacy told her the medication needed prior approval.

## 2012-11-27 NOTE — Telephone Encounter (Signed)
[facility] 7.5/500.  Next script will have to be for 325.

## 2012-11-27 NOTE — Telephone Encounter (Signed)
Just do 325 mg acetaminophen

## 2012-12-06 ENCOUNTER — Encounter
Payer: Worker's Compensation | Attending: Physical Medicine and Rehabilitation | Admitting: Physical Medicine and Rehabilitation

## 2012-12-06 ENCOUNTER — Encounter: Payer: Self-pay | Admitting: Physical Medicine and Rehabilitation

## 2012-12-06 VITALS — BP 163/98 | HR 81 | Resp 14 | Ht 68.0 in | Wt 212.0 lb

## 2012-12-06 DIAGNOSIS — G8929 Other chronic pain: Secondary | ICD-10-CM | POA: Insufficient documentation

## 2012-12-06 DIAGNOSIS — G90519 Complex regional pain syndrome I of unspecified upper limb: Secondary | ICD-10-CM | POA: Insufficient documentation

## 2012-12-06 DIAGNOSIS — G9050 Complex regional pain syndrome I, unspecified: Secondary | ICD-10-CM

## 2012-12-06 DIAGNOSIS — T43205A Adverse effect of unspecified antidepressants, initial encounter: Secondary | ICD-10-CM | POA: Insufficient documentation

## 2012-12-06 DIAGNOSIS — L2989 Other pruritus: Secondary | ICD-10-CM | POA: Insufficient documentation

## 2012-12-06 DIAGNOSIS — L298 Other pruritus: Secondary | ICD-10-CM | POA: Insufficient documentation

## 2012-12-06 MED ORDER — DULOXETINE HCL 30 MG PO CPEP: ORAL_CAPSULE | ORAL | Status: DC

## 2012-12-06 NOTE — Progress Notes (Signed)
Subjective:    Patient ID: [name], female    DOB: 1966/06/04, 47 y.o.   MRN: [id number]  HPI The patient complains about chronic left arm pain, after an accident at her workplace Dec. 1994. The patient also complains about numbness and tingling in her left arm.  The problem is controlled by medications most of the time. She also states, that she can not use her pool anymore, and not being able to do her aquatic exercises has increased her symptoms some.  The patient reports, that she had an allergic reaction to the generic Cymbalta, mainly itching. She is asking me, whether I can prescribe the brand name medication,which she tolerates well.  Pain Inventory Average Pain 8 Pain Right Now 8 My pain is constant, sharp, burning, stabbing, tingling and aching  In the last 24 hours, has pain interfered with the following? General activity 8 Relation with others 8 Enjoyment of life 8 What TIME of day is your pain at its worst? all the time Sleep (in general) Poor  Pain is worse with: some activites Pain improves with: medication Relief from Meds: 7  Mobility ability to climb steps?  yes do you drive?  yes  Function disabled: date disabled 74 I need assistance with the following:  household duties and shopping  Neuro/Psych weakness numbness tremor tingling spasms dizziness depression anxiety  Prior Studies Any changes since last visit?  no  Physicians involved in your care Any changes since last visit?  no   Family History  Problem Relation Age of Onset  . Cancer Mother     lung  . Hypertension Mother   . Cancer Father     colon  . Hypertension Sister   . Birth defects Maternal Grandmother     breast  . Birth defects Paternal Grandmother     uterine, stomach, lung   History   Social History  . Marital Status: Married    Spouse Name: N/A    Number of Children: 2  . Years of Education: N/A   Occupational History  . Disabled due to RSD     Social History Main Topics  . Smoking status: Never Smoker   . Smokeless tobacco: Never Used  . Alcohol Use: No  . Drug Use: No  . Sexually Active: Yes   Other Topics Concern  . None   Social History Narrative  . None   Past Surgical History  Procedure Laterality Date  . Cesarean section    . Tonsillectomy    . Nasal septal repair    . Abdominal hysterectomy    . Appendectomy    . Bladder lift  10/18/2005  . Oophorectomy  10/18/2006    right   Past Medical History  Diagnosis Date  . Anxiety   . Asthma   . History of colonic polyps   . GERD (gastroesophageal reflux disease)   . Reflex sympathetic dystrophy   . Migraines   . Sleep apnea   . Hypertension   . Allergy   . Hyperlipidemia   . ASCVD (arteriosclerotic cardiovascular disease)   . Obesity (BMI 30.0-34.9)    BP 163/98  Pulse 81  Resp 14  Ht 5\' 8"  (1.727 m)  Wt 212 lb (96.163 kg)  BMI 32.24 kg/m2  SpO2 97%     Review of Systems  Constitutional: Positive for diaphoresis.  Respiratory: Positive for apnea.   Gastrointestinal: Positive for nausea and constipation.  Neurological: Positive for dizziness, tremors, weakness and numbness.  Psychiatric/Behavioral: Positive for  dysphoric mood. The patient is nervous/anxious.   All other systems reviewed and are negative.       Objective:   Physical Exam Constitutional: She is oriented to person, place, and time. She appears well-developed and well-nourished.  HENT:  Head: Normocephalic.  Neck: Neck supple.  Musculoskeletal: She exhibits extreme tenderness, even to minimal touch.  Neurological: She is alert and oriented to person, place, and time.  Skin: Skin is warm and dry.  Psychiatric: She has a normal mood and affect.  Symmetric normal motor tone is noted throughout. Normal muscle bulk. Muscle testing reveals 5/5 muscle strength of the upper extremity, except left UE patient is very hesitant to move against any resistance because of fear of  pain, could not test completely, and 5/5 of the lower extremity. Full range of motion in upper and lower extremities, except left wrist and fingers, restrited motion mainly because of severe pain. ROM of spine is mildly restricted.  DTR in the upper and lower extremity are present and symmetric 2+. No clonus is noted.  Patient arises from chair without difficulty. Narrow based gait with normal arm swing bilateral , able to walk on heels and toes . Tandem walk is stable. No pronator drift. Rhomberg negative.        Assessment & Plan:  1. Reflex sympathetic dystrophy left upper extremity. A work related injury in the 1990s. Continue current medication management.  2. Lower remedy pain in the left foot. 3. She had an allergic reaction to the generic Cymbalta, mainly itching. She is asking me, whether I can prescribe the brand name medication,which she tolerates well.Prescribed Cymbalta brand only, advised patient that she should take the generic back to her pharmacy, when she is picking up the brand name medication. PLAN  Patient is managing her symptoms with exercising in the pool and on land and with medication. Unfortunately the patient can not continue with her aquatic exercises, because it is too cold in her outdoor pool, not being able to do her aquatic exercises have contributed to he increased symptoms. It would be very beneficial for her to continue her aquatic exercises in an indoor pool in a gym, advised patient to find out whether [facility] would pay for aquatic exercising in a gym, I would recommend this.  Also discussed how to fill out a disability form, spent 25 min with patient face to face. Follow up in 3 month.

## 2012-12-06 NOTE — Patient Instructions (Signed)
Try to stay as active as tolerated, try to look into exercising in the pool, which would be very beneficial for your problem.

## 2012-12-09 ENCOUNTER — Other Ambulatory Visit: Payer: Self-pay | Admitting: Physical Medicine & Rehabilitation

## 2012-12-15 ENCOUNTER — Other Ambulatory Visit: Payer: Self-pay | Admitting: Physical Medicine & Rehabilitation

## 2012-12-17 ENCOUNTER — Other Ambulatory Visit: Payer: Self-pay | Admitting: Internal Medicine

## 2012-12-25 ENCOUNTER — Other Ambulatory Visit: Payer: Self-pay | Admitting: Physical Medicine & Rehabilitation

## 2012-12-25 MED ORDER — HYDROCODONE-ACETAMINOPHEN 7.5-325 MG PO TABS: 1.0000 | ORAL_TABLET | Freq: Every day | ORAL | Status: DC

## 2012-12-25 NOTE — Telephone Encounter (Signed)
Hydrocodone refilled and changed to 7.5/325.

## 2012-12-26 ENCOUNTER — Ambulatory Visit (INDEPENDENT_AMBULATORY_CARE_PROVIDER_SITE_OTHER): Payer: BC Managed Care – PPO | Admitting: Family Medicine

## 2012-12-26 ENCOUNTER — Encounter: Payer: Self-pay | Admitting: Family Medicine

## 2012-12-26 VITALS — BP 134/82 | HR 88 | Temp 98.4°F | Wt 213.5 lb

## 2012-12-26 DIAGNOSIS — K439 Ventral hernia without obstruction or gangrene: Secondary | ICD-10-CM | POA: Insufficient documentation

## 2012-12-26 MED ORDER — HYDROCODONE-ACETAMINOPHEN 7.5-325 MG PO TABS: 1.0000 | ORAL_TABLET | Freq: Three times a day (TID) | ORAL | Status: DC | PRN

## 2012-12-26 NOTE — Progress Notes (Signed)
Lump in abdomen noted near umbilicus.  Noted recently, more tender.  She may have had a similar episode last year, but it resolved then.  Noted when standing up, not when supine.  No FCV.  Some diarrhea recently.  Her current pain meds aren't helping the pain.   She has a h/o mult abd surgeries.   Meds, vitals, and allergies reviewed.   ROS: See HPI.  Otherwise, noncontributory.  nad abd soft Soft reducible hernia just inferior to the umbilicus, noted with cough.

## 2012-12-26 NOTE — Patient Instructions (Addendum)
See Rosaria Ferries about your referral before you leave today. I called the pain clinic about your situation, Dr. Letta Pate.   He knows about the plan with your pain meds.  Take care.

## 2012-12-26 NOTE — Assessment & Plan Note (Signed)
Anatomy d/w pt.  Okay for outpatient f/u.  Not incarcerated, but precautions given.  D/w pain clinic MD; okay to inc vicodin in meantime.  Refer to gen surgery.  She agrees.

## 2012-12-31 ENCOUNTER — Other Ambulatory Visit: Payer: Self-pay | Admitting: Physical Medicine & Rehabilitation

## 2013-01-01 ENCOUNTER — Other Ambulatory Visit: Payer: Self-pay | Admitting: Physical Medicine & Rehabilitation

## 2013-01-02 ENCOUNTER — Encounter (INDEPENDENT_AMBULATORY_CARE_PROVIDER_SITE_OTHER): Payer: Self-pay | Admitting: Surgery

## 2013-01-02 ENCOUNTER — Ambulatory Visit (INDEPENDENT_AMBULATORY_CARE_PROVIDER_SITE_OTHER): Payer: BC Managed Care – PPO | Admitting: Surgery

## 2013-01-02 VITALS — BP 152/86 | HR 68 | Temp 98.1°F | Resp 16 | Ht 68.0 in | Wt 212.0 lb

## 2013-01-02 DIAGNOSIS — K432 Incisional hernia without obstruction or gangrene: Secondary | ICD-10-CM

## 2013-01-02 NOTE — Progress Notes (Signed)
Patient ID: [name], female   DOB: [birth year], 47 y.o.   MRN: [id number]  Chief Complaint  Patient presents with  . Umbilical Hernia    HPI [name] is a 47 y.o. female.   HPI This is a very pleasant female referred by Dr. Elsie Stain for evaluation of a symptomatic hernia below the umbilicus. She has noticed this recently. He will occasionally stick out but always reduces. She has pain and some nausea but otherwise no obstructive symptoms. The pain is moderate intensity and sharp. Otherwise she is without complaints. Past Medical History  Diagnosis Date  . Anxiety   . Asthma   . History of colonic polyps   . GERD (gastroesophageal reflux disease)   . Reflex sympathetic dystrophy   . Migraines   . Sleep apnea   . Hypertension   . Allergy   . Hyperlipidemia   . ASCVD (arteriosclerotic cardiovascular disease)   . Obesity (BMI 30.0-34.9)     Past Surgical History  Procedure Laterality Date  . Cesarean section    . Tonsillectomy    . Nasal septal repair    . Abdominal hysterectomy    . Appendectomy    . Bladder lift  10/18/2005  . Oophorectomy  10/18/2006    right    Family History  Problem Relation Age of Onset  . Cancer Mother     lung  . Hypertension Mother   . Cancer Father     colon  . Hypertension Sister   . Birth defects Maternal Grandmother     breast  . Birth defects Paternal Grandmother     uterine, stomach, lung    Social History History  Substance Use Topics  . Smoking status: Never Smoker   . Smokeless tobacco: Never Used  . Alcohol Use: No    Allergies  Allergen Reactions  . Cymbalta (Duloxetine Hcl)     Generic makes her feel like she is crawling out of her skin  . Demerol   . Diazepam     REACTION: swelling  . Meperidine Hcl     REACTION: swelling  . Valium     Current Outpatient Prescriptions  Medication Sig Dispense Refill  . albuterol (PROAIR HFA) 108 (90 BASE) MCG/ACT inhaler Inhale 2 puffs into the lungs every  4 (four) hours as needed.  1 Inhaler  4  . DULoxetine (CYMBALTA) 30 MG capsule TAKE 3 CAPSULES BY MOUTH EVERY DAY  270 capsule  0  . HYDROcodone-acetaminophen (LORTAB) 7.5-500 MG per tablet Take 1 tablet by mouth at bedtime.  30 tablet  0  . HYDROcodone-acetaminophen (NORCO) 7.5-325 MG per tablet Take 1 tablet by mouth at bedtime. Must last 30 days.  30 tablet  0  . HYDROcodone-acetaminophen (NORCO) 7.5-325 MG per tablet Take 1 tablet by mouth every 8 (eight) hours as needed for pain (for hernia).  30 tablet  0  . ibuprofen (ADVIL,MOTRIN) 600 MG tablet TAKE 1 TABLET (600 MG TOTAL) BY MOUTH 3 (THREE) TIMES DAILY.  90 tablet  1  . ibuprofen (ADVIL,MOTRIN) 600 MG tablet TAKE 1 TABLET (600 MG TOTAL) BY MOUTH 3 (THREE) TIMES DAILY.  90 tablet  0  . lidocaine (LIDODERM) 5 % PLACE 1 PATCH ONTO THE SKIN DAILY. REMOVE & DISCARD PATCH WITHIN 12 HOURS OR AS DIRECTED BY MD  30 patch  2  . loratadine (CLARITIN) 10 MG tablet Take 10 mg by mouth daily.        . metoprolol tartrate (LOPRESSOR) 25 MG tablet TAKE  ONE TABLET BY MOUTH TWICE DAILY  180 tablet  0  . NEXIUM 40 MG capsule TAKE 1 CAPSULE BY MOUTH 2 TIMES A DAY  60 capsule  2  . simvastatin (ZOCOR) 20 MG tablet Take 20 mg by mouth every evening.      Marland Kitchen tiZANidine (ZANAFLEX) 4 MG tablet TAKE 1 TABLET (4 MG TOTAL) BY MOUTH 4 (FOUR) TIMES DAILY.  120 tablet  2  . topiramate (TOPAMAX) 100 MG tablet Take 100 mg by mouth daily. Take q am to go with the 231m tablet q hs      . topiramate (TOPAMAX) 200 MG tablet TAKE 1 TABLET (200 MG TOTAL) BY MOUTH DAILY.  30 tablet  2  . traMADol (ULTRAM) 50 MG tablet Take 1 tablet (50 mg total) by mouth at bedtime.  90 tablet  1  . traMADol (ULTRAM-ER) 100 MG 24 hr tablet Take 1 tablet (100 mg total) by mouth daily.  90 tablet  1   No current facility-administered medications for this visit.    Review of Systems Review of Systems  Constitutional: Positive for fever and fatigue. Negative for chills and unexpected weight  change.  HENT: Negative for hearing loss, congestion, sore throat, trouble swallowing and voice change.   Eyes: Negative for visual disturbance.  Respiratory: Positive for cough and shortness of breath. Negative for wheezing.   Cardiovascular: Negative for chest pain, palpitations and leg swelling.  Gastrointestinal: Positive for nausea and abdominal pain. Negative for vomiting, diarrhea, constipation, blood in stool, abdominal distention and anal bleeding.  Genitourinary: Negative for hematuria, vaginal bleeding and difficulty urinating.  Musculoskeletal: Negative for arthralgias.  Skin: Negative for rash and wound.  Neurological: Negative for seizures, syncope and headaches.  Hematological: Negative for adenopathy. Does not bruise/bleed easily.  Psychiatric/Behavioral: Negative for confusion.    Blood pressure 152/86, pulse 68, temperature 98.1 F (36.7 C), temperature source Temporal, resp. rate 16, height 5' 8"  (1.727 m), weight 212 lb (96.163 kg).  Physical Exam Physical Exam  Constitutional: She is oriented to person, place, and time. She appears well-developed and well-nourished. No distress.  HENT:  Head: Normocephalic and atraumatic.  Right Ear: External ear normal.  Left Ear: External ear normal.  Nose: Nose normal.  Mouth/Throat: Oropharynx is clear and moist.  Eyes: Conjunctivae are normal. Pupils are equal, round, and reactive to light. Right eye exhibits no discharge. Left eye exhibits no discharge. No scleral icterus.  Neck: Normal range of motion. Neck supple. No tracheal deviation present. No thyromegaly present.  Cardiovascular: Normal rate, regular rhythm, normal heart sounds and intact distal pulses.   No murmur heard. Pulmonary/Chest: Effort normal and breath sounds normal. No respiratory distress. She has no wheezes. She has no rales.  Abdominal: Soft. Bowel sounds are normal. She exhibits no distension and no mass. There is no tenderness. There is no rebound.   There is a moderate-sized reducible incisional hernia at the umbilicus  Musculoskeletal: Normal range of motion. She exhibits no edema and no tenderness.  Lymphadenopathy:    She has no cervical adenopathy.  Neurological: She is alert and oriented to person, place, and time.  Skin: Skin is warm and dry. No rash noted. She is not diaphoretic. No erythema.  Psychiatric: Her behavior is normal. Judgment normal.    Data Reviewed   Assessment    Incisional hernia at the umbilicus     Plan    I discussed this with her in detail. I discussed repair with mesh. I discussed with the  laparoscopic and open techniques. I discussed the risk of surgery. I discussed the risks of surgery which includes but is not limited to bleeding, infection, injury to surrounding structures, need to convert to an open procedure, et Ronney Asters. She understands and wished to proceed with laparoscopic repair with mesh. Surgery will be scheduled. Likelihood of success is good       Taison Celani A 01/02/2013, 11:46 AM

## 2013-01-09 ENCOUNTER — Encounter (HOSPITAL_COMMUNITY): Payer: Self-pay | Admitting: Pharmacy Technician

## 2013-01-16 ENCOUNTER — Encounter (HOSPITAL_COMMUNITY)
Admission: RE | Admit: 2013-01-16 | Discharge: 2013-01-16 | Disposition: A | Discharge status: Home / Self Care | Payer: BC Managed Care – PPO | Source: Ambulatory Visit | Attending: Surgery | Admitting: Surgery

## 2013-01-16 ENCOUNTER — Encounter (HOSPITAL_COMMUNITY): Payer: Self-pay

## 2013-01-16 HISTORY — DX: Unspecified osteoarthritis, unspecified site: M19.90

## 2013-01-16 HISTORY — DX: Fibromyalgia: M79.7

## 2013-01-16 HISTORY — DX: Transient cerebral ischemic attack, unspecified: G45.9

## 2013-01-16 LAB — BASIC METABOLIC PANEL
BUN: 17 mg/dL (ref 6–23)
CO2: 29 mEq/L (ref 19–32)
Calcium: 9.9 mg/dL (ref 8.4–10.5)
Chloride: 100 mEq/L (ref 96–112)
Creatinine, Ser: 0.78 mg/dL (ref 0.50–1.10)
GFR calc Af Amer: >90 mL/min (ref >90)

## 2013-01-16 LAB — CBC
HCT: 43.4 % (ref 36.0–46.0)
MCH: 28.6 pg (ref 26.0–34.0)
MCV: 84.9 fL (ref 78.0–100.0)
Platelets: 251 10*3/uL (ref 150–400)
RDW: 13.4 % (ref 11.5–15.5)
WBC: 12 10*3/uL — ABNORMAL HIGH (ref 4.0–10.5)

## 2013-01-16 LAB — SURGICAL PCR SCREEN: MRSA, PCR: NEGATIVE

## 2013-01-16 NOTE — Pre-Procedure Instructions (Signed)
Thailyn Khalid  01/16/2013   Your procedure is scheduled on:   Wednesday  01/24/13  Report to [facility] at 700 AM.  Call this number if you have problems the morning of surgery: [phone]   Remember:   Do not eat food or drink liquids after midnight.   Take these medicines the morning of surgery with A SIP OF WATER:ALBUTEROL, CYMBALTA, NEXIUM, HYDROCODONE IF NEEDED, METOPROLOL(LOPRESSOR), TOPAMAX  (STOP ASPIRIN, COUMADIN, PLAVIX, EFFIENT, HERBAL MEDICINES)    Do not wear jewelry, make-up or nail polish.  Do not wear lotions, powders, or perfumes. You may wear deodorant.  Do not shave 48 hours prior to surgery. Men may shave face and neck.  Do not bring valuables to the hospital.  Contacts, dentures or bridgework may not be worn into surgery.  Leave suitcase in the car. After surgery it may be brought to your room.  For patients admitted to the hospital, checkout time is 11:00 AM the day of  discharge.   Patients discharged the day of surgery will not be allowed to drive  home.  Name and phone number of your driver:   Special Instructions: Shower using CHG 2 nights before surgery and the night before surgery.  If you shower the day of surgery use CHG.  Use special wash - you have one bottle of CHG for all showers.  You should use approximately 1/3 of the bottle for each shower.   Please read over the following fact sheets that you were given: Pain Booklet, Coughing and Deep Breathing, MRSA Information and Surgical Site Infection Prevention

## 2013-01-17 ENCOUNTER — Other Ambulatory Visit: Payer: Self-pay | Admitting: Physical Medicine & Rehabilitation

## 2013-01-23 MED ORDER — DEXTROSE 5 % IV SOLN
3.0000 g | INTRAVENOUS | Status: AC
  Administered 2013-01-24: 3 g via INTRAVENOUS
  Filled 2013-01-23: qty 3000

## 2013-01-23 NOTE — H&P (Signed)
Patient ID: [name], female DOB: 12/14/1965, 47 y.o. MRN: [id number]  Chief Complaint   Patient presents with   .  Umbilical Hernia   HPI  [name] is a 47 y.o. female.  HPI  This is a very pleasant female referred by Dr. Elsie Stain for evaluation of a symptomatic hernia below the umbilicus. She has noticed this recently. He will occasionally stick out but always reduces. She has pain and some nausea but otherwise no obstructive symptoms. The pain is moderate intensity and sharp. Otherwise she is without complaints.  Past Medical History   Diagnosis  Date   .  Anxiety    .  Asthma    .  History of colonic polyps    .  GERD (gastroesophageal reflux disease)    .  Reflex sympathetic dystrophy    .  Migraines    .  Sleep apnea    .  Hypertension    .  Allergy    .  Hyperlipidemia    .  ASCVD (arteriosclerotic cardiovascular disease)    .  Obesity (BMI 30.0-34.9)     Past Surgical History   Procedure  Laterality  Date   .  Cesarean section     .  Tonsillectomy     .  Nasal septal repair     .  Abdominal hysterectomy     .  Appendectomy     .  Bladder lift   10/18/2005   .  Oophorectomy   10/18/2006     right    Family History   Problem  Relation  Age of Onset   .  Cancer  Mother      lung   .  Hypertension  Mother    .  Cancer  Father      colon   .  Hypertension  Sister    .  Birth defects  Maternal Grandmother      breast   .  Birth defects  Paternal Grandmother      uterine, stomach, lung   Social History  History   Substance Use Topics   .  Smoking status:  Never Smoker   .  Smokeless tobacco:  Never Used   .  Alcohol Use:  No    Allergies   Allergen  Reactions   .  Cymbalta (Duloxetine Hcl)      Generic makes her feel like she is crawling out of her skin   .  Demerol    .  Diazepam      REACTION: swelling   .  Meperidine Hcl      REACTION: swelling   .  Valium     Current Outpatient Prescriptions   Medication  Sig  Dispense  Refill    .  albuterol (PROAIR HFA) 108 (90 BASE) MCG/ACT inhaler  Inhale 2 puffs into the lungs every 4 (four) hours as needed.  1 Inhaler  4   .  DULoxetine (CYMBALTA) 30 MG capsule  TAKE 3 CAPSULES BY MOUTH EVERY DAY  270 capsule  0   .  HYDROcodone-acetaminophen (LORTAB) 7.5-500 MG per tablet  Take 1 tablet by mouth at bedtime.  30 tablet  0   .  HYDROcodone-acetaminophen (NORCO) 7.5-325 MG per tablet  Take 1 tablet by mouth at bedtime. Must last 30 days.  30 tablet  0   .  HYDROcodone-acetaminophen (NORCO) 7.5-325 MG per tablet  Take 1 tablet by mouth every 8 (eight) hours as needed for  pain (for hernia).  30 tablet  0   .  ibuprofen (ADVIL,MOTRIN) 600 MG tablet  TAKE 1 TABLET (600 MG TOTAL) BY MOUTH 3 (THREE) TIMES DAILY.  90 tablet  1   .  ibuprofen (ADVIL,MOTRIN) 600 MG tablet  TAKE 1 TABLET (600 MG TOTAL) BY MOUTH 3 (THREE) TIMES DAILY.  90 tablet  0   .  lidocaine (LIDODERM) 5 %  PLACE 1 PATCH ONTO THE SKIN DAILY. REMOVE & DISCARD PATCH WITHIN 12 HOURS OR AS DIRECTED BY MD  30 patch  2   .  loratadine (CLARITIN) 10 MG tablet  Take 10 mg by mouth daily.     .  metoprolol tartrate (LOPRESSOR) 25 MG tablet  TAKE ONE TABLET BY MOUTH TWICE DAILY  180 tablet  0   .  NEXIUM 40 MG capsule  TAKE 1 CAPSULE BY MOUTH 2 TIMES A DAY  60 capsule  2   .  simvastatin (ZOCOR) 20 MG tablet  Take 20 mg by mouth every evening.     Marland Kitchen  tiZANidine (ZANAFLEX) 4 MG tablet  TAKE 1 TABLET (4 MG TOTAL) BY MOUTH 4 (FOUR) TIMES DAILY.  120 tablet  2   .  topiramate (TOPAMAX) 100 MG tablet  Take 100 mg by mouth daily. Take q am to go with the 252m tablet q hs     .  topiramate (TOPAMAX) 200 MG tablet  TAKE 1 TABLET (200 MG TOTAL) BY MOUTH DAILY.  30 tablet  2   .  traMADol (ULTRAM) 50 MG tablet  Take 1 tablet (50 mg total) by mouth at bedtime.  90 tablet  1   .  traMADol (ULTRAM-ER) 100 MG 24 hr tablet  Take 1 tablet (100 mg total) by mouth daily.  90 tablet  1    No current facility-administered medications for this visit.    Review of Systems  Review of Systems  Constitutional: Positive for fever and fatigue. Negative for chills and unexpected weight change.  HENT: Negative for hearing loss, congestion, sore throat, trouble swallowing and voice change.  Eyes: Negative for visual disturbance.  Respiratory: Positive for cough and shortness of breath. Negative for wheezing.  Cardiovascular: Negative for chest pain, palpitations and leg swelling.  Gastrointestinal: Positive for nausea and abdominal pain. Negative for vomiting, diarrhea, constipation, blood in stool, abdominal distention and anal bleeding.  Genitourinary: Negative for hematuria, vaginal bleeding and difficulty urinating.  Musculoskeletal: Negative for arthralgias.  Skin: Negative for rash and wound.  Neurological: Negative for seizures, syncope and headaches.  Hematological: Negative for adenopathy. Does not bruise/bleed easily.  Psychiatric/Behavioral: Negative for confusion.  Blood pressure 152/86, pulse 68, temperature 98.1 F (36.7 C), temperature source Temporal, resp. rate 16, height 5' 8"  (1.727 m), weight 212 lb (96.163 kg).  Physical Exam  Physical Exam  Constitutional: She is oriented to person, place, and time. She appears well-developed and well-nourished. No distress.  HENT:  Head: Normocephalic and atraumatic.  Right Ear: External ear normal.  Left Ear: External ear normal.  Nose: Nose normal.  Mouth/Throat: Oropharynx is clear and moist.  Eyes: Conjunctivae are normal. Pupils are equal, round, and reactive to light. Right eye exhibits no discharge. Left eye exhibits no discharge. No scleral icterus.  Neck: Normal range of motion. Neck supple. No tracheal deviation present. No thyromegaly present.  Cardiovascular: Normal rate, regular rhythm, normal heart sounds and intact distal pulses.  No murmur heard.  Pulmonary/Chest: Effort normal and breath sounds normal. No respiratory distress. She  has no wheezes. She has no rales.   Abdominal: Soft. Bowel sounds are normal. She exhibits no distension and no mass. There is no tenderness. There is no rebound.  There is a moderate-sized reducible incisional hernia at the umbilicus  Musculoskeletal: Normal range of motion. She exhibits no edema and no tenderness.  Lymphadenopathy:  She has no cervical adenopathy.  Neurological: She is alert and oriented to person, place, and time.  Skin: Skin is warm and dry. No rash noted. She is not diaphoretic. No erythema.  Psychiatric: Her behavior is normal. Judgment normal.  Data Reviewed  Assessment  Incisional hernia at the umbilicus  Plan  I discussed this with her in detail. I discussed repair with mesh. I discussed with the laparoscopic and open techniques. I discussed the risk of surgery. I discussed the risks of surgery which includes but is not limited to bleeding, infection, injury to surrounding structures, need to convert to an open procedure, et Ronney Asters. She understands and wished to proceed with laparoscopic repair with mesh. Surgery will be scheduled. Likelihood of success is good

## 2013-01-24 ENCOUNTER — Encounter (HOSPITAL_COMMUNITY)
Admission: RE | Disposition: A | Discharge status: Home / Self Care | Payer: Self-pay | Source: Ambulatory Visit | Attending: Surgery

## 2013-01-24 ENCOUNTER — Encounter (HOSPITAL_COMMUNITY): Payer: Self-pay | Admitting: *Deleted

## 2013-01-24 ENCOUNTER — Ambulatory Visit (HOSPITAL_COMMUNITY): Payer: BC Managed Care – PPO | Admitting: Certified Registered"

## 2013-01-24 ENCOUNTER — Encounter (HOSPITAL_COMMUNITY): Payer: Self-pay | Admitting: Certified Registered"

## 2013-01-24 ENCOUNTER — Observation Stay (HOSPITAL_COMMUNITY)
Admission: RE | Admit: 2013-01-24 | Discharge: 2013-01-25 | Disposition: A | Discharge status: Home / Self Care | Payer: BC Managed Care – PPO | Source: Ambulatory Visit | Attending: Surgery | Admitting: Surgery

## 2013-01-24 DIAGNOSIS — Z01812 Encounter for preprocedural laboratory examination: Secondary | ICD-10-CM | POA: Insufficient documentation

## 2013-01-24 DIAGNOSIS — I10 Essential (primary) hypertension: Secondary | ICD-10-CM | POA: Insufficient documentation

## 2013-01-24 DIAGNOSIS — Z01818 Encounter for other preprocedural examination: Secondary | ICD-10-CM | POA: Insufficient documentation

## 2013-01-24 DIAGNOSIS — Z01810 Encounter for preprocedural cardiovascular examination: Secondary | ICD-10-CM | POA: Insufficient documentation

## 2013-01-24 DIAGNOSIS — K432 Incisional hernia without obstruction or gangrene: Secondary | ICD-10-CM

## 2013-01-24 HISTORY — PX: INCISIONAL HERNIA REPAIR: SHX193

## 2013-01-24 HISTORY — PX: LAPAROSCOPIC INCISIONAL / UMBILICAL / VENTRAL HERNIA REPAIR: SUR789

## 2013-01-24 HISTORY — DX: Headache: R51

## 2013-01-24 HISTORY — DX: Headache, unspecified: R51.9

## 2013-01-24 HISTORY — DX: Complex regional pain syndrome I, unspecified: G90.50

## 2013-01-24 HISTORY — PX: INSERTION OF MESH: SHX5868

## 2013-01-24 SURGERY — REPAIR, HERNIA, INCISIONAL, LAPAROSCOPIC
Anesthesia: General | Site: Abdomen | Wound class: Clean

## 2013-01-24 MED ORDER — POTASSIUM CHLORIDE IN NACL 20-0.9 MEQ/L-% IV SOLN
INTRAVENOUS | Status: DC
  Administered 2013-01-24: 75 mL/h via INTRAVENOUS
  Filled 2013-01-24 (×4): qty 1000

## 2013-01-24 MED ORDER — NEOSTIGMINE METHYLSULFATE 1 MG/ML IJ SOLN
INTRAMUSCULAR | Status: DC | PRN
  Administered 2013-01-24: 4 mg via INTRAVENOUS

## 2013-01-24 MED ORDER — EPHEDRINE SULFATE 50 MG/ML IJ SOLN
INTRAMUSCULAR | Status: DC | PRN
  Administered 2013-01-24: 5 mg via INTRAVENOUS
  Administered 2013-01-24: 10 mg via INTRAVENOUS
  Administered 2013-01-24: 5 mg via INTRAVENOUS
  Administered 2013-01-24: 10 mg via INTRAVENOUS
  Administered 2013-01-24: 5 mg via INTRAVENOUS

## 2013-01-24 MED ORDER — OXYCODONE HCL 5 MG PO TABS
ORAL_TABLET | ORAL | Status: AC
  Administered 2013-01-24: 5 mg via ORAL
  Filled 2013-01-24: qty 1

## 2013-01-24 MED ORDER — TOPIRAMATE 100 MG PO TABS
200.0000 mg | ORAL_TABLET | Freq: Every day | ORAL | Status: DC
  Administered 2013-01-24: 200 mg via ORAL
  Filled 2013-01-24 (×2): qty 2

## 2013-01-24 MED ORDER — PHENYLEPHRINE HCL 10 MG/ML IJ SOLN
INTRAMUSCULAR | Status: DC | PRN
  Administered 2013-01-24: 80 ug via INTRAVENOUS

## 2013-01-24 MED ORDER — DIPHENHYDRAMINE HCL 50 MG/ML IJ SOLN
INTRAMUSCULAR | Status: AC
  Administered 2013-01-24: 12.5 mg via INTRAVENOUS
  Filled 2013-01-24: qty 1

## 2013-01-24 MED ORDER — HYDROMORPHONE HCL PF 1 MG/ML IJ SOLN
INTRAMUSCULAR | Status: AC
  Filled 2013-01-24: qty 1

## 2013-01-24 MED ORDER — ARTIFICIAL TEARS OP OINT
TOPICAL_OINTMENT | OPHTHALMIC | Status: DC | PRN
  Administered 2013-01-24: 1 via OPHTHALMIC

## 2013-01-24 MED ORDER — OXYCODONE HCL 5 MG PO TABS: 5.0000 mg | ORAL_TABLET | Freq: Once | ORAL | Status: DC

## 2013-01-24 MED ORDER — ONDANSETRON HCL 4 MG/2ML IJ SOLN: 4.0000 mg | Freq: Four times a day (QID) | INTRAMUSCULAR | Status: DC | PRN

## 2013-01-24 MED ORDER — DULOXETINE HCL 60 MG PO CPEP
90.0000 mg | ORAL_CAPSULE | Freq: Every day | ORAL | Status: DC
  Administered 2013-01-25: 90 mg via ORAL
  Filled 2013-01-24: qty 1

## 2013-01-24 MED ORDER — HYDROCODONE-ACETAMINOPHEN 5-325 MG PO TABS
1.0000 | ORAL_TABLET | ORAL | Status: DC | PRN
  Administered 2013-01-24: 2 via ORAL

## 2013-01-24 MED ORDER — GLYCOPYRROLATE 0.2 MG/ML IJ SOLN
INTRAMUSCULAR | Status: DC | PRN
  Administered 2013-01-24: 0.6 mg via INTRAVENOUS

## 2013-01-24 MED ORDER — METOPROLOL TARTRATE 25 MG PO TABS
25.0000 mg | ORAL_TABLET | Freq: Two times a day (BID) | ORAL | Status: DC
  Administered 2013-01-24 – 2013-01-25 (×2): 25 mg via ORAL
  Filled 2013-01-24 (×3): qty 1

## 2013-01-24 MED ORDER — ENOXAPARIN SODIUM 40 MG/0.4ML ~~LOC~~ SOLN
40.0000 mg | SUBCUTANEOUS | Status: DC
  Administered 2013-01-25: 40 mg via SUBCUTANEOUS
  Filled 2013-01-24: qty 0.4

## 2013-01-24 MED ORDER — HYDROCODONE-ACETAMINOPHEN 5-325 MG PO TABS
ORAL_TABLET | ORAL | Status: AC
  Filled 2013-01-24: qty 2

## 2013-01-24 MED ORDER — DIPHENHYDRAMINE HCL 50 MG/ML IJ SOLN: 12.5000 mg | Freq: Once | INTRAMUSCULAR | Status: AC

## 2013-01-24 MED ORDER — OXYCODONE HCL 5 MG PO TABS
5.0000 mg | ORAL_TABLET | Freq: Once | ORAL | Status: AC | PRN
  Administered 2013-01-24: 5 mg via ORAL

## 2013-01-24 MED ORDER — IBUPROFEN 600 MG PO TABS: 600.0000 mg | ORAL_TABLET | Freq: Four times a day (QID) | ORAL | Status: DC | PRN

## 2013-01-24 MED ORDER — BUPIVACAINE-EPINEPHRINE PF 0.25-1:200000 % IJ SOLN
INTRAMUSCULAR | Status: AC
  Filled 2013-01-24: qty 30

## 2013-01-24 MED ORDER — LIDOCAINE HCL (CARDIAC) 20 MG/ML IV SOLN
INTRAVENOUS | Status: DC | PRN
  Administered 2013-01-24: 50 mg via INTRAVENOUS

## 2013-01-24 MED ORDER — FENTANYL CITRATE 0.05 MG/ML IJ SOLN
INTRAMUSCULAR | Status: DC | PRN
  Administered 2013-01-24: 50 ug via INTRAVENOUS
  Administered 2013-01-24: 100 ug via INTRAVENOUS
  Administered 2013-01-24 (×2): 50 ug via INTRAVENOUS

## 2013-01-24 MED ORDER — TOPIRAMATE 100 MG PO TABS
100.0000 mg | ORAL_TABLET | Freq: Every day | ORAL | Status: DC
  Administered 2013-01-25: 100 mg via ORAL
  Filled 2013-01-24: qty 1

## 2013-01-24 MED ORDER — ONDANSETRON HCL 4 MG PO TABS: 4.0000 mg | ORAL_TABLET | Freq: Four times a day (QID) | ORAL | Status: DC | PRN

## 2013-01-24 MED ORDER — ROCURONIUM BROMIDE 100 MG/10ML IV SOLN
INTRAVENOUS | Status: DC | PRN
  Administered 2013-01-24: 35 mg via INTRAVENOUS

## 2013-01-24 MED ORDER — LIDOCAINE HCL 4 % MT SOLN
OROMUCOSAL | Status: DC | PRN
  Administered 2013-01-24: 4 mL via TOPICAL

## 2013-01-24 MED ORDER — PANTOPRAZOLE SODIUM 40 MG PO TBEC
40.0000 mg | DELAYED_RELEASE_TABLET | Freq: Every day | ORAL | Status: DC
  Administered 2013-01-25: 40 mg via ORAL
  Filled 2013-01-24: qty 1

## 2013-01-24 MED ORDER — LACTATED RINGERS IV SOLN
INTRAVENOUS | Status: DC
  Administered 2013-01-24: 10:00:00 via INTRAVENOUS

## 2013-01-24 MED ORDER — OXYCODONE HCL 5 MG/5ML PO SOLN: 5.0000 mg | Freq: Once | ORAL | Status: AC | PRN

## 2013-01-24 MED ORDER — TIZANIDINE HCL 4 MG PO TABS
4.0000 mg | ORAL_TABLET | Freq: Four times a day (QID) | ORAL | Status: DC
  Administered 2013-01-24 – 2013-01-25 (×4): 4 mg via ORAL
  Filled 2013-01-24 (×6): qty 1

## 2013-01-24 MED ORDER — HYDROMORPHONE HCL PF 1 MG/ML IJ SOLN
0.2500 mg | INTRAMUSCULAR | Status: DC | PRN
  Administered 2013-01-24 (×4): 0.5 mg via INTRAVENOUS

## 2013-01-24 MED ORDER — MORPHINE SULFATE 4 MG/ML IJ SOLN
4.0000 mg | INTRAMUSCULAR | Status: DC | PRN
  Administered 2013-01-24 – 2013-01-25 (×5): 4 mg via INTRAVENOUS
  Filled 2013-01-24 (×5): qty 1

## 2013-01-24 MED ORDER — ALBUTEROL SULFATE HFA 108 (90 BASE) MCG/ACT IN AERS: 2.0000 | INHALATION_SPRAY | RESPIRATORY_TRACT | Status: DC | PRN

## 2013-01-24 MED ORDER — LACTATED RINGERS IV SOLN
INTRAVENOUS | Status: DC | PRN
  Administered 2013-01-24 (×2): via INTRAVENOUS

## 2013-01-24 MED ORDER — ONDANSETRON HCL 4 MG/2ML IJ SOLN
INTRAMUSCULAR | Status: DC | PRN
  Administered 2013-01-24: 4 mg via INTRAVENOUS

## 2013-01-24 MED ORDER — KETOROLAC TROMETHAMINE 30 MG/ML IJ SOLN
INTRAMUSCULAR | Status: DC | PRN
  Administered 2013-01-24: 30 mg via INTRAVENOUS

## 2013-01-24 MED ORDER — OXYCODONE-ACETAMINOPHEN 5-325 MG PO TABS
1.0000 | ORAL_TABLET | ORAL | Status: DC | PRN
  Administered 2013-01-24 – 2013-01-25 (×5): 2 via ORAL
  Filled 2013-01-24 (×5): qty 2

## 2013-01-24 MED ORDER — OXYCODONE HCL 5 MG PO TABS
ORAL_TABLET | ORAL | Status: AC
  Filled 2013-01-24: qty 1

## 2013-01-24 MED ORDER — PROPOFOL 10 MG/ML IV BOLUS
INTRAVENOUS | Status: DC | PRN
  Administered 2013-01-24 (×2): 50 mg via INTRAVENOUS
  Administered 2013-01-24: 150 mg via INTRAVENOUS

## 2013-01-24 SURGICAL SUPPLY — 38 items
BANDAGE ADHESIVE 1X3 (GAUZE/BANDAGES/DRESSINGS) ×9 IMPLANT
BENZOIN TINCTURE PRP APPL 2/3 (GAUZE/BANDAGES/DRESSINGS) ×3 IMPLANT
CHLORAPREP W/TINT 26ML (MISCELLANEOUS) ×3 IMPLANT
CLOTH BEACON ORANGE TIMEOUT ST (SAFETY) ×3 IMPLANT
CLSR STERI-STRIP ANTIMIC 1/2X4 (GAUZE/BANDAGES/DRESSINGS) ×3 IMPLANT
COVER SURGICAL LIGHT HANDLE (MISCELLANEOUS) ×3 IMPLANT
DECANTER SPIKE VIAL GLASS SM (MISCELLANEOUS) ×3 IMPLANT
DERMABOND ADVANCED (GAUZE/BANDAGES/DRESSINGS) ×1
DERMABOND ADVANCED .7 DNX12 (GAUZE/BANDAGES/DRESSINGS) ×2 IMPLANT
DEVICE SECURE STRAP 25 ABSORB (INSTRUMENTS) ×3 IMPLANT
DEVICE TROCAR PUNCTURE CLOSURE (ENDOMECHANICALS) ×3 IMPLANT
ELECT REM PT RETURN 9FT ADLT (ELECTROSURGICAL) ×3
ELECTRODE REM PT RTRN 9FT ADLT (ELECTROSURGICAL) ×2 IMPLANT
GLOVE BIO SURGEON STRL SZ 6.5 (GLOVE) ×3 IMPLANT
GLOVE BIO SURGEON STRL SZ7.5 (GLOVE) ×3 IMPLANT
GLOVE BIOGEL PI IND STRL 6 (GLOVE) ×2 IMPLANT
GLOVE BIOGEL PI IND STRL 7.0 (GLOVE) ×2 IMPLANT
GLOVE BIOGEL PI INDICATOR 6 (GLOVE) ×1
GLOVE BIOGEL PI INDICATOR 7.0 (GLOVE) ×1
GLOVE SURG SIGNA 7.5 PF LTX (GLOVE) ×3 IMPLANT
GOWN PREVENTION PLUS XLARGE (GOWN DISPOSABLE) ×3 IMPLANT
GOWN STRL NON-REIN LRG LVL3 (GOWN DISPOSABLE) ×6 IMPLANT
KIT BASIN OR (CUSTOM PROCEDURE TRAY) ×3 IMPLANT
KIT ROOM TURNOVER OR (KITS) ×3 IMPLANT
MARKER SKIN DUAL TIP RULER LAB (MISCELLANEOUS) ×3 IMPLANT
MESH VENTRALIGHT ST 4.5IN (Mesh General) ×3 IMPLANT
NEEDLE SPNL 22GX3.5 QUINCKE BK (NEEDLE) ×3 IMPLANT
NS IRRIG 1000ML POUR BTL (IV SOLUTION) ×3 IMPLANT
PAD ARMBOARD 7.5X6 YLW CONV (MISCELLANEOUS) ×3 IMPLANT
SET IRRIG TUBING LAPAROSCOPIC (IRRIGATION / IRRIGATOR) IMPLANT
SLEEVE ENDOPATH XCEL 5M (ENDOMECHANICALS) ×3 IMPLANT
SUT MON AB 4-0 PC3 18 (SUTURE) ×3 IMPLANT
SUT NOVA NAB GS-21 0 18 T12 DT (SUTURE) ×3 IMPLANT
TOWEL OR 17X24 6PK STRL BLUE (TOWEL DISPOSABLE) ×3 IMPLANT
TOWEL OR 17X26 10 PK STRL BLUE (TOWEL DISPOSABLE) ×3 IMPLANT
TRAY LAPAROSCOPIC (CUSTOM PROCEDURE TRAY) ×3 IMPLANT
TROCAR XCEL NON-BLD 11X100MML (ENDOMECHANICALS) ×3 IMPLANT
TROCAR XCEL NON-BLD 5MMX100MML (ENDOMECHANICALS) ×3 IMPLANT

## 2013-01-24 NOTE — Progress Notes (Signed)
Report given to philip rn as cargiver 

## 2013-01-24 NOTE — Op Note (Signed)
LAPAROSCOPIC INCISIONAL HERNIA, INSERTION OF MESH  Procedure Note  [name] 01/24/2013   Pre-op Diagnosis: Incisional hernia at the umbilicus     Post-op Diagnosis: same  Procedure(s): LAPAROSCOPIC INCISIONAL HERNIA INSERTION OF MESH (11.4 cm round Bard ventlight patch)  Surgeon(s): Harl Bowie, MD  Anesthesia: General  Staff:  Circulator: Jaynie Crumble, RN Scrub Person: Epifanio Lesches Pingue, CST; Quincy Carnes, RN  Estimated Blood Loss: Minimal               Procedure: The patient was brought to the operating room and identified as the correct patient. She was placed supine on the operative table and general anesthesia was induced. Her abdomen was then prepped and draped in the usual sterile fashion. I made a small incision with a scalpel and the left upper quadrant. Using a 5 mm trocar and 5 mm scope, I traversed all layers of the abdominal wall slowly undraping vision in diminishes to the peritoneal cavity. Insufflation of the abdomen was then begun. I evaluated the entrance site insole no evidence of bowel injury. The patient had a defect at the umbilicus through her previous scar. There was no scar tissue or omentum or bowel fixated to the abdominal wall. I made a separate incision in the left mid flank. I placed a 10 mm trocar there. I then brought an 11.4 cm round piece of Bard ventral light mesh onto the field. I placed 4 separate 0 Novafil sutures around the mesh. I then rolled the mesh up and placed into the port. I then unrolled the mesh in the abdominal cavity under direct vision. I made 4 separate stab incisions and used the suture passer under direct vision to grasp all the sutures And pull them out through the abdominal wall under direct vision. I then pulled the sutures tied fixating the mesh to the peritoneal surface. I then tied the sutures in place. Greater than 4 cm circumferential coverage from the fascial defect appeared to be achieved. I tacked the  mesh in place with a absorbable tacker. Again, good coverage of the fascial defect appeared to be achieved. Hemostasis also appeared to be achieved. All ports were removed under direct vision and the abdomen was deflated. All incisions were anesthetized with Marcaine and closed with 4-0 Monocryl subcuticular sutures. Steri-Strips and Band-Aids were then applied. The patient tolerated the procedure now. All the counts were correct at the end of the procedure. The patient was inactivated in the operating room and taken in a stable condition to the recovery room.          [name] A   Date: 01/24/2013  Time: 10:40 AM

## 2013-01-24 NOTE — Anesthesia Postprocedure Evaluation (Signed)
  Anesthesia Post-op Note  Patient: [name]  Procedure(s) Performed: Procedure(s): LAPAROSCOPIC INCISIONAL HERNIA (N/A) INSERTION OF MESH (N/A)  Patient Location: PACU  Anesthesia Type:General  Level of Consciousness: awake and alert   Airway and Oxygen Therapy: Patient Spontanous Breathing and Patient connected to nasal cannula oxygen  Post-op Pain: moderate  Post-op Assessment: Post-op Vital signs reviewed, Patient's Cardiovascular Status Stable, Respiratory Function Stable, Patent Airway and No signs of Nausea or vomiting  Post-op Vital Signs: Reviewed and stable  Complications: No apparent anesthesia complications

## 2013-01-24 NOTE — Progress Notes (Signed)
Pt. Stated she preferred percocet to Vicodin.  Dr. Magnus Ivan notified.  Percocet ordered.

## 2013-01-24 NOTE — Transfer of Care (Signed)
Immediate Anesthesia Transfer of Care Note  Patient: [name]  Procedure(s) Performed: Procedure(s): LAPAROSCOPIC INCISIONAL HERNIA (N/A) INSERTION OF MESH (N/A)  Patient Location: PACU  Anesthesia Type:General  Level of Consciousness: awake, alert  and oriented  Airway & Oxygen Therapy: Patient Spontanous Breathing and Patient connected to nasal cannula oxygen  Post-op Assessment: Report given to PACU RN, Post -op Vital signs reviewed and stable and Patient moving all extremities X 4  Post vital signs: Reviewed and stable  Complications: No apparent anesthesia complications

## 2013-01-24 NOTE — Anesthesia Preprocedure Evaluation (Addendum)
Anesthesia Evaluation  Patient identified by MRN, date of birth, ID band Patient awake    Reviewed: Allergy & Precautions, H&P , NPO status , Patient's Chart, lab work & pertinent test results, reviewed documented beta blocker date and time   Airway Mallampati: III TM Distance: >3 FB Neck ROM: Full    Dental no notable dental hx. (+) Teeth Intact and Dental Advisory Given   Pulmonary asthma , sleep apnea ,  breath sounds clear to auscultation  Pulmonary exam normal       Cardiovascular hypertension, On Medications and On Home Beta Blockers Rhythm:Regular Rate:Normal     Neuro/Psych  Headaches, TIA Neuromuscular disease negative psych ROS   GI/Hepatic Neg liver ROS, GERD-  Medicated and Controlled,  Endo/Other  negative endocrine ROS  Renal/GU negative Renal ROS  negative genitourinary   Musculoskeletal   Abdominal   Peds  Hematology negative hematology ROS (+)   Anesthesia Other Findings   Reproductive/Obstetrics negative OB ROS                          Anesthesia Physical Anesthesia Plan  ASA: III  Anesthesia Plan: General   Post-op Pain Management:    Induction: Intravenous  Airway Management Planned: Oral ETT  Additional Equipment:   Intra-op Plan:   Post-operative Plan: Extubation in OR  Informed Consent: I have reviewed the patients History and Physical, chart, labs and discussed the procedure including the risks, benefits and alternatives for the proposed anesthesia with the patient or authorized representative who has indicated his/her understanding and acceptance.   Dental advisory given  Plan Discussed with: CRNA and Surgeon  Anesthesia Plan Comments:         Anesthesia Quick Evaluation

## 2013-01-24 NOTE — Interval H&P Note (Signed)
History and Physical Interval Note: no change in H and P  01/24/2013 9:30 AM  [name]  has presented today for surgery, with the diagnosis of incisional hernia at the umbilicus  The various methods of treatment have been discussed with the patient and family. After consideration of risks, benefits and other options for treatment, the patient has consented to  Procedure(s): LAPAROSCOPIC INCISIONAL HERNIA (N/A) INSERTION OF MESH (N/A) as a surgical intervention .  The patient's history has been reviewed, patient examined, no change in status, stable for surgery.  I have reviewed the patient's chart and labs.  Questions were answered to the patient's satisfaction.     [name] A

## 2013-01-25 ENCOUNTER — Encounter (HOSPITAL_COMMUNITY): Payer: Self-pay | Admitting: Surgery

## 2013-01-25 ENCOUNTER — Other Ambulatory Visit: Payer: Self-pay | Admitting: Family Medicine

## 2013-01-25 MED ORDER — OXYCODONE-ACETAMINOPHEN 10-325 MG PO TABS: 1.0000 | ORAL_TABLET | ORAL | Status: DC | PRN

## 2013-01-25 NOTE — Telephone Encounter (Signed)
Electronic refill request.  Please advise. 

## 2013-01-25 NOTE — Progress Notes (Signed)
UR completed 

## 2013-01-25 NOTE — Progress Notes (Signed)
Orthopedic Tech Progress Note Patient Details:  [name] June 27, 1966 [id number]  Ortho Devices Type of Ortho Device: Abdominal binder Ortho Device/Splint Interventions: Casandra Doffing 01/25/2013, 3:59 PM

## 2013-01-25 NOTE — Discharge Summary (Signed)
Physician Discharge Summary  Patient ID: [name] MRN: [id number] DOB/AGE: 49/06/1966 49 y.o.  Admit date: 01/24/2013 Discharge date: 01/25/2013  Admission Diagnoses:  Discharge Diagnoses:  Active Problems:   * No active hospital problems. * INCISIONAL HERNIA  Discharged Condition: good  Hospital Course: ADMITTED TO FLOOR POST OP.  SLIPPED OF TOILET BUT OTHERWISE UNEVENTFUL RECOVERY.  DISCHARGED POD#1  Consults: None  Significant Diagnostic Studies:   Treatments: surgery: LAPAROSCOPIC INCISIONAL HERNIA REPAIR WITH MESH   Discharge Exam: Blood pressure 142/83, pulse 84, temperature 98.2 F (36.8 C), temperature source Oral, resp. rate 18, SpO2 91.00%. General appearance: alert and no distress Resp: clear to auscultation bilaterally Incision/Wound: ABDOMEN SOFT, DRESSINGS DRY  Disposition: 01-Home or Self Care   Future Appointments Provider Department Dept Phone   02/09/2013 1:40 PM Harl Bowie, [facility], [place] [phone]   03/02/2013 11:00 AM Aundria Mems, PA-C  [facility] Medicine [facility] [phone]       Medication List    TAKE these medications       albuterol 108 (90 BASE) MCG/ACT inhaler  Commonly known as:  PROAIR HFA  Inhale 2 puffs into the lungs every 4 (four) hours as needed.     DULoxetine 30 MG capsule  Commonly known as:  CYMBALTA  Take 90 mg by mouth daily.     esomeprazole 40 MG capsule  Commonly known as:  NEXIUM  Take 40 mg by mouth 2 (two) times daily.     HYDROcodone-acetaminophen 7.5-500 MG per tablet  Commonly known as:  LORTAB  Take 1 tablet by mouth at bedtime.     HYDROcodone-acetaminophen 7.5-325 MG per tablet  Commonly known as:  NORCO  Take 1 tablet by mouth every 8 (eight) hours as needed for pain (for hernia).     ibuprofen 600 MG tablet  Commonly known as:  ADVIL,MOTRIN  TAKE 1 TABLET (600 MG TOTAL) BY MOUTH 3 (THREE) TIMES DAILY.     ibuprofen 600 MG tablet  Commonly  known as:  ADVIL,MOTRIN  Take 600 mg by mouth every 8 (eight) hours as needed for pain.     lidocaine 5 %  Commonly known as:  LIDODERM  Place 1 patch onto the skin daily. Remove & Discard patch within 12 hours or as directed by MD     loratadine 10 MG tablet  Commonly known as:  CLARITIN  Take 10 mg by mouth daily.     metoprolol tartrate 25 MG tablet  Commonly known as:  LOPRESSOR  Take 25 mg by mouth 2 (two) times daily.     oxyCODONE-acetaminophen 10-325 MG per tablet  Commonly known as:  PERCOCET  Take 1 tablet by mouth every 4 (four) hours as needed for pain.     tiZANidine 4 MG tablet  Commonly known as:  ZANAFLEX  Take 4 mg by mouth 4 (four) times daily.     topiramate 100 MG tablet  Commonly known as:  TOPAMAX  Take 100 mg by mouth daily. Take q am to go with the 258m tablet q hs     topiramate 200 MG tablet  Commonly known as:  TOPAMAX  Take 200 mg by mouth at bedtime. Pt takes 1015min a.m. and 20037mn pm     traMADol 50 MG tablet  Commonly known as:  ULTRAM  Take 1 tablet (50 mg total) by mouth at bedtime.     traMADol 100 MG 24 hr tablet  Commonly known as:  ULTRAM-ER  Take  1 tablet (100 mg total) by mouth daily.           Follow-up Information   Follow up with [facility], MD. Call in 2 weeks.   Contact information:   [address] [place] [address] [phone]       Signed: Harl Bowie 01/25/2013, 7:19 AM

## 2013-01-25 NOTE — Progress Notes (Signed)
1 Day Post-Op  Subjective: POD#1 Complains of incisional pain Fell of toilet, but OK  Objective: Vital signs in last 24 hours: Temp:  [98 F (36.7 C)-98.9 F (37.2 C)] 98.2 F (36.8 C) (04/10 0542) Pulse Rate:  [70-104] 84 (04/10 0542) Resp:  [10-27] 18 (04/10 0542) BP: (128-156)/(74-103) 142/83 mmHg (04/10 0542) SpO2:  [89 %-100 %] 91 % (04/10 0542) Last BM Date: 01/23/13  Intake/Output from previous day: 04/09 0701 - 04/10 0700 In: 2693.3 [P.O.:320; I.V.:2373.3] Out: 1000 [Urine:1000] Intake/Output this shift:   Comfortable Abdomen soft, dressings dry  Lab Results:  No results found for this basename: WBC, HGB, HCT, PLT,  in the last 72 hours BMET No results found for this basename: NA, K, CL, CO2, GLUCOSE, BUN, CREATININE, CALCIUM,  in the last 72 hours PT/INR No results found for this basename: LABPROT, INR,  in the last 72 hours ABG No results found for this basename: PHART, PCO2, PO2, HCO3,  in the last 72 hours  Studies/Results: No results found.  Anti-infectives: Anti-infectives   Start     Dose/Rate Route Frequency Ordered Stop   01/24/13 0600  ceFAZolin (ANCEF) 3 g in dextrose 5 % 50 mL IVPB     3 g 160 mL/hr over 30 Minutes Intravenous On call to O.R. 01/23/13 1435 01/24/13 1010      Assessment/Plan: s/p Procedure(s): LAPAROSCOPIC INCISIONAL HERNIA (N/A) INSERTION OF MESH (N/A)  Discharge  LOS: 1 day    Isadore Palecek A 01/25/2013

## 2013-01-26 ENCOUNTER — Telehealth: Payer: Self-pay | Admitting: *Deleted

## 2013-01-26 NOTE — Telephone Encounter (Signed)
Okay #30 x 0 She just had her ventral hernia repaired If she needs more pain meds, she will need to ask the surgeon

## 2013-01-26 NOTE — Telephone Encounter (Signed)
Recieved a fax from pharmacy that pt got # 30hydrocodone 7.5-325 from you on 12/26/12, and # 30 hydrocodone 5-325 on 01/19/13 and # 60oxycodone 10/325 on 4/10 from Dr Rayburn Ma. I will leave fax in your inbox.

## 2013-01-26 NOTE — Telephone Encounter (Signed)
rx called into pharmacy Spoke with patient's husband and advised results.

## 2013-01-28 ENCOUNTER — Other Ambulatory Visit: Payer: Self-pay | Admitting: Physical Medicine & Rehabilitation

## 2013-01-29 ENCOUNTER — Encounter (INDEPENDENT_AMBULATORY_CARE_PROVIDER_SITE_OTHER): Payer: Self-pay | Admitting: General Surgery

## 2013-01-29 ENCOUNTER — Ambulatory Visit (INDEPENDENT_AMBULATORY_CARE_PROVIDER_SITE_OTHER): Payer: BC Managed Care – PPO | Admitting: General Surgery

## 2013-01-29 ENCOUNTER — Telehealth: Payer: Self-pay

## 2013-01-29 VITALS — BP 136/74 | HR 84 | Temp 98.2°F | Resp 20 | Ht 68.0 in | Wt 212.0 lb

## 2013-01-29 DIAGNOSIS — Z09 Encounter for follow-up examination after completed treatment for conditions other than malignant neoplasm: Secondary | ICD-10-CM

## 2013-01-29 NOTE — Telephone Encounter (Signed)
Tried to contact patient.  She is unavailable.

## 2013-01-29 NOTE — Telephone Encounter (Signed)
[facility] called to say patient has gotten other pain medication filled from other providers.  On 4/4 hydrocodone 5/325 from Dr Rayburn Ma was filled, 4/10 percocet from Dr Rayburn Ma, 3/11 hydrocodone from Dr Para March.  Please advise whether patient should continue to be a patient or discontinue her narcotic medications through [place].   She has had surgery but pharmacy wanted to make [place] aware.  Please advise.  If ok we need to call pharmacy to refill hydrocodone.

## 2013-01-29 NOTE — Telephone Encounter (Signed)
Further refills either need to come through the PCP, the pain clinic, or the surgery clinic.

## 2013-01-29 NOTE — Telephone Encounter (Signed)
Form scanned. She just had surgery with Dr. Rayburn Ma.  I had cleared my prev rx with the pain clinic.

## 2013-01-29 NOTE — Progress Notes (Signed)
The patient comes in after recent fall while sitting on the toilet. She wrenched her body and felt intense pain around her periumbilical site.  On examination today the patient has a slight fullness in the right infraumbilical area. This seems to be slightly more prominent in the standing position. It is not reducible. It is tender but not excruciating pain.  The patient also has some mild left inferior peri umbilical discomfort, and one of her left lower quadrant incision site slightly open but is not infected.  It is my impression based on her examination that she likely does have some periumbilical swelling related to her prior repair. I cannot definitively say that she does not have a recurrent hernia although I do not think that is the case. If it is a hernia then more than likely it will not get any smaller by the time she has her regularly scheduled appointment to see Dr. Ninfa Linden on April 24.  She should keep that appointment to see her primary surgeon on the 24th. In the meantime no further treatment is planned. She continued to wear her abdominal binder.

## 2013-01-29 NOTE — Telephone Encounter (Signed)
Note faxed to pharmacy

## 2013-01-29 NOTE — Telephone Encounter (Signed)
We will need to get notes regarding the surgery from the orthopedic office. We will not prescribe any narcotic analgesics until they released her from their care

## 2013-01-30 NOTE — Telephone Encounter (Signed)
Patient informed we will not refill hydrocodone until notes form her surgeon say they have released her.  She can contact them for refills in the meantime.

## 2013-02-06 ENCOUNTER — Telehealth (INDEPENDENT_AMBULATORY_CARE_PROVIDER_SITE_OTHER): Payer: Self-pay | Admitting: General Surgery

## 2013-02-06 NOTE — Telephone Encounter (Signed)
Pt called for pain med refill.  Per standing protocol, Hydrocodone 5/325 mg, # 30, 1-2 po Q4-6H prn, no refill called to [facility]:  [phone].  Pt has post op office visit on 02/08/13 with the surgeon.  Explained this will likely be her final narcotic refill from Dr. Magnus Ivan, barring any unexplained complication.

## 2013-02-09 ENCOUNTER — Ambulatory Visit (INDEPENDENT_AMBULATORY_CARE_PROVIDER_SITE_OTHER): Payer: BC Managed Care – PPO | Admitting: Surgery

## 2013-02-09 ENCOUNTER — Encounter (INDEPENDENT_AMBULATORY_CARE_PROVIDER_SITE_OTHER): Payer: Self-pay | Admitting: Surgery

## 2013-02-09 VITALS — BP 123/80 | HR 72 | Temp 98.0°F | Resp 18 | Ht 68.0 in | Wt 210.2 lb

## 2013-02-09 DIAGNOSIS — Z09 Encounter for follow-up examination after completed treatment for conditions other than malignant neoplasm: Secondary | ICD-10-CM

## 2013-02-09 NOTE — Progress Notes (Signed)
Subjective:     Patient ID: [name], female   DOB: 1965-11-24, 47 y.o.   MRN: [id number]  HPI She is here for another postop visit. She is status post a laparoscopic incisional hernia repair with mesh. She fail in the hospital and has had increased pain since then  Review of Systems     Objective:   Physical Exam On exam, there still firmness at the right aspect of the incision. I believe this is fluid in the hernia sac itself and not a recurrence that is still difficult to tell    Assessment:     Patient stable postop     Plan:     I will give her Percocet. She will refrain from heavy lifting for 2 more weeks. I will see her back in 2 months unless her pain worsens

## 2013-02-23 ENCOUNTER — Other Ambulatory Visit: Payer: Self-pay

## 2013-02-23 MED ORDER — TOPIRAMATE 200 MG PO TABS: ORAL_TABLET | ORAL | Status: DC

## 2013-02-23 MED ORDER — IBUPROFEN 600 MG PO TABS: ORAL_TABLET | ORAL | Status: DC

## 2013-02-28 ENCOUNTER — Ambulatory Visit (INDEPENDENT_AMBULATORY_CARE_PROVIDER_SITE_OTHER): Payer: BC Managed Care – PPO | Admitting: Internal Medicine

## 2013-02-28 ENCOUNTER — Encounter: Payer: Self-pay | Admitting: Internal Medicine

## 2013-02-28 VITALS — BP 148/80 | HR 70 | Temp 98.6°F | Ht 68.0 in | Wt 214.0 lb

## 2013-02-28 DIAGNOSIS — R200 Anesthesia of skin: Secondary | ICD-10-CM

## 2013-02-28 DIAGNOSIS — L909 Atrophic disorder of skin, unspecified: Secondary | ICD-10-CM

## 2013-02-28 DIAGNOSIS — I6789 Other cerebrovascular disease: Secondary | ICD-10-CM

## 2013-02-28 DIAGNOSIS — F411 Generalized anxiety disorder: Secondary | ICD-10-CM

## 2013-02-28 DIAGNOSIS — G4730 Sleep apnea, unspecified: Secondary | ICD-10-CM

## 2013-02-28 DIAGNOSIS — E785 Hyperlipidemia, unspecified: Secondary | ICD-10-CM

## 2013-02-28 DIAGNOSIS — K219 Gastro-esophageal reflux disease without esophagitis: Secondary | ICD-10-CM

## 2013-02-28 DIAGNOSIS — G43809 Other migraine, not intractable, without status migrainosus: Secondary | ICD-10-CM

## 2013-02-28 DIAGNOSIS — R209 Unspecified disturbances of skin sensation: Secondary | ICD-10-CM

## 2013-02-28 DIAGNOSIS — Z860100 Personal history of colon polyps, unspecified: Secondary | ICD-10-CM

## 2013-02-28 DIAGNOSIS — Z0000 Encounter for general adult medical examination without abnormal findings: Secondary | ICD-10-CM

## 2013-02-28 DIAGNOSIS — K432 Incisional hernia without obstruction or gangrene: Secondary | ICD-10-CM

## 2013-02-28 DIAGNOSIS — G9050 Complex regional pain syndrome I, unspecified: Secondary | ICD-10-CM

## 2013-02-28 DIAGNOSIS — T7840XA Allergy, unspecified, initial encounter: Secondary | ICD-10-CM

## 2013-02-28 DIAGNOSIS — L918 Other hypertrophic disorders of the skin: Secondary | ICD-10-CM

## 2013-02-28 DIAGNOSIS — J309 Allergic rhinitis, unspecified: Secondary | ICD-10-CM

## 2013-02-28 DIAGNOSIS — I10 Essential (primary) hypertension: Secondary | ICD-10-CM

## 2013-02-28 DIAGNOSIS — L919 Hypertrophic disorder of the skin, unspecified: Secondary | ICD-10-CM

## 2013-02-28 DIAGNOSIS — J45909 Unspecified asthma, uncomplicated: Secondary | ICD-10-CM

## 2013-02-28 LAB — CBC WITH DIFFERENTIAL/PLATELET
Basophils Relative: 0.5 % (ref 0.0–3.0)
Eosinophils Absolute: 0.3 10*3/uL (ref 0.0–0.7)
Eosinophils Relative: 3.4 % (ref 0.0–5.0)
Hemoglobin: 13.7 g/dL (ref 12.0–15.0)
Lymphocytes Relative: 22.4 % (ref 12.0–46.0)
Monocytes Relative: 4.6 % (ref 3.0–12.0)
Neutrophils Relative %: 69.1 % (ref 43.0–77.0)
RBC: 4.77 Mil/uL (ref 3.87–5.11)
WBC: 10 10*3/uL (ref 4.5–10.5)

## 2013-02-28 LAB — BASIC METABOLIC PANEL
Calcium: 9 mg/dL (ref 8.4–10.5)
Creatinine, Ser: 0.8 mg/dL (ref 0.4–1.2)
GFR: 85.99 mL/min (ref >60.00)
Sodium: 139 mEq/L (ref 135–145)

## 2013-02-28 LAB — HEPATIC FUNCTION PANEL
ALT: 16 U/L (ref 0–35)
AST: 15 U/L (ref 0–37)
Alkaline Phosphatase: 95 U/L (ref 39–117)
Bilirubin, Direct: 0.1 mg/dL (ref 0.0–0.3)
Total Bilirubin: 0.6 mg/dL (ref 0.3–1.2)

## 2013-02-28 LAB — VITAMIN B12: Vitamin B-12: 267 pg/mL (ref 211–911)

## 2013-02-28 LAB — LDL CHOLESTEROL, DIRECT: Direct LDL: 93.7 mg/dL

## 2013-02-28 LAB — LIPID PANEL
Total CHOL/HDL Ratio: 6
Triglycerides: 405 mg/dL — ABNORMAL HIGH (ref 0.0–149.0)

## 2013-02-28 LAB — T4, FREE: Free T4: 0.88 ng/dL (ref 0.60–1.60)

## 2013-02-28 NOTE — Assessment & Plan Note (Signed)
Keeps up with gyn care She will set up mammo

## 2013-02-28 NOTE — Assessment & Plan Note (Signed)
Now in feet as well as hands Will check labs May be part of RSD though

## 2013-02-28 NOTE — Assessment & Plan Note (Signed)
Each treated with liquid nitrogen 35 seconds x 2 Discussed home care

## 2013-02-28 NOTE — Patient Instructions (Signed)
You can call to schedule your own screening mammogram

## 2013-02-28 NOTE — Progress Notes (Signed)
Subjective:    Patient ID: [name], female    DOB: 09-16-1966, 47 y.o.   MRN: [id number]  HPI Here for physical  Had recent ventral hernia repair Notes injury in hospital ---twisted on loose toilet seat Some sharp stabbing pains (?from the mesh)  Saw Dr Marice Potter for gyn care last year Had breast exam Discussed that mammograms can be scheduled by the patient now No sex recently---due to the dyspareunia  Continues with pain management Satisfied with treatment   Trying to walk Having pain in feet ---numbness is severe in distal feet, feels "like I'm walking on boulders" Hand numbness is from RSD  Has 2 skin tags under breasts They are irritating  Current Outpatient Prescriptions on File Prior to Visit  Medication Sig Dispense Refill  . albuterol (PROAIR HFA) 108 (90 BASE) MCG/ACT inhaler Inhale 2 puffs into the lungs every 4 (four) hours as needed.  1 Inhaler  4  . DULoxetine (CYMBALTA) 30 MG capsule Take 90 mg by mouth daily.      Marland Kitchen esomeprazole (NEXIUM) 40 MG capsule Take 40 mg by mouth 2 (two) times daily.      Marland Kitchen HYDROcodone-acetaminophen (LORTAB) 7.5-500 MG per tablet Take 1 tablet by mouth at bedtime.  30 tablet  0  . ibuprofen (ADVIL,MOTRIN) 600 MG tablet TAKE 1 TABLET (600 MG TOTAL) BY MOUTH 3 (THREE) TIMES DAILY.  90 tablet  0  . lidocaine (LIDODERM) 5 % PLACE 1 PATCH ONTO THE SKIN DAILY. REMOVE & DISCARD PATCH WITHIN 12 HOURS OR AS DIRECTED BY MD  30 patch  0  . loratadine (CLARITIN) 10 MG tablet Take 10 mg by mouth daily.        . metoprolol tartrate (LOPRESSOR) 25 MG tablet Take 25 mg by mouth 2 (two) times daily.      Marland Kitchen tiZANidine (ZANAFLEX) 4 MG tablet Take 4 mg by mouth 4 (four) times daily.      Marland Kitchen topiramate (TOPAMAX) 200 MG tablet TAKE 1 TABLET (200 MG TOTAL) BY MOUTH DAILY.  30 tablet  0  . traMADol (ULTRAM) 50 MG tablet Take 1 tablet (50 mg total) by mouth at bedtime.  90 tablet  1  . traMADol (ULTRAM-ER) 100 MG 24 hr tablet Take 1 tablet (100 mg total) by  mouth daily.  90 tablet  1   No current facility-administered medications on file prior to visit.    Allergies  Allergen Reactions  . Demerol (Meperidine) Anaphylaxis, Itching and Swelling  . Cymbalta (Duloxetine Hcl)     Generic makes her feel like she is crawling out of her skin  . Diazepam     REACTION: swelling  . Meperidine Hcl     REACTION: swelling    Past Medical History  Diagnosis Date  . Anxiety   . Asthma   . History of colonic polyps   . GERD (gastroesophageal reflux disease)   . Hypertension   . Allergy   . Hyperlipidemia   . ASCVD (arteriosclerotic cardiovascular disease)   . Obesity (BMI 30.0-34.9)   . Sleep apnea     NO MACHINE RECOMMENDED  . TIA (transient ischemic attack) ~ 2009  . Migraines   . Daily headache     "depends on the season" (01/24/2013)  . Arthritis     "just the norm" (01/24/2013)  . Fibromyalgia   . RSD (reflex sympathetic dystrophy)     Past Surgical History  Procedure Laterality Date  . Oophorectomy Right 2009  . Laparoscopic incisional / umbilical /  ventral hernia repair  01/24/2013    IHR w/mesh/notes  . Hernia repair    . Tonsillectomy  1990's  . Nasal septum surgery  1980's?  . Appendectomy  ~ 06/2007  . Bladder repair  ~ 06/2007    "same day after bladder lift" (01/24/2013)  . Bladder suspension  ~ 06/2007  . Vaginal hysterectomy  ` 06/2007  . Diagnostic laparoscopy  1990's & ~ 2000    "I've had a couple; for endometrosis" (01/24/2013)  . Incisional hernia repair N/A 01/24/2013    Procedure: LAPAROSCOPIC INCISIONAL HERNIA;  Surgeon: Shelly Rubenstein, MD;  Location: [facility] OR;  Service: General;  Laterality: N/A;  . Insertion of mesh N/A 01/24/2013    Procedure: INSERTION OF MESH;  Surgeon: Shelly Rubenstein, MD;  Location: [facility] OR;  Service: General;  Laterality: N/A;    Family History  Problem Relation Age of Onset  . Cancer Mother     lung  . Hypertension Mother   . Cancer Father     colon  . Hypertension Sister   . Birth  defects Maternal Grandmother     breast  . Birth defects Paternal Grandmother     uterine, stomach, lung    History   Social History  . Marital Status: Married    Spouse Name: N/A    Number of Children: 2  . Years of Education: N/A   Occupational History  . Disabled due to RSD    Social History Main Topics  . Smoking status: Never Smoker   . Smokeless tobacco: Never Used  . Alcohol Use: Yes     Comment: 01/24/2013 "drink or 2 once or /twice/year, if that"  . Drug Use: No  . Sexually Active: Not Currently   Other Topics Concern  . Not on file   Social History Narrative  . No narrative on file   Review of Systems  Constitutional: Positive for fatigue and unexpected weight change.       Weight down 20#--then gained it back Starting new diet Wears seat belt  HENT: Positive for congestion and rhinorrhea. Negative for hearing loss, dental problem and tinnitus.        Regular with dentist  Eyes: Positive for visual disturbance.       Gets visual aura before migraines  Respiratory: Positive for cough, chest tightness and shortness of breath.        Gets heaviness in chest---at rest and exertional. Notices it more since her surgery Uses inhaler at times Coughs at night  Cardiovascular: Positive for palpitations and leg swelling. Negative for chest pain.       Rare racing heart with panic  Gastrointestinal: Positive for constipation. Negative for nausea, vomiting, abdominal pain and blood in stool.       Heartburn fairly well controlled with the nexium Mild chronic constipation--meds don't help  Genitourinary: Positive for dyspareunia. Negative for dysuria, hematuria and difficulty urinating.  Musculoskeletal: Positive for back pain and arthralgias. Negative for joint swelling.  Skin: Positive for rash.       occ rash she relates to RSD  Allergic/Immunologic: Positive for environmental allergies. Negative for immunocompromised state.       Loratadine some help   Neurological: Positive for weakness, numbness and headaches. Negative for dizziness, syncope and light-headedness.  Hematological: Negative for adenopathy. Does not bruise/bleed easily.  Psychiatric/Behavioral: Positive for sleep disturbance and dysphoric mood. The patient is nervous/anxious.        Intermittent mild depressed mood  Objective:   Physical Exam  Constitutional: She is oriented to person, place, and time. She appears well-developed and well-nourished. No distress.  HENT:  Head: Normocephalic and atraumatic.  Right Ear: External ear normal.  Left Ear: External ear normal.  Mouth/Throat: Oropharynx is clear and moist. No oropharyngeal exudate.  Eyes: Conjunctivae and EOM are normal. Pupils are equal, round, and reactive to light.  Neck: Normal range of motion. Neck supple. No thyromegaly present.  Cardiovascular: Normal rate, regular rhythm, normal heart sounds and intact distal pulses.  Exam reveals no gallop.   No murmur heard. Pulmonary/Chest: Effort normal and breath sounds normal. No respiratory distress. She has no wheezes. She has no rales.  Abdominal: Soft. There is no tenderness.  Small indurated area at umbilicus--doesn't seem to be defect in repair (from stitch?)  Musculoskeletal: She exhibits no edema.  Lymphadenopathy:    She has no cervical adenopathy.  Neurological: She is alert and oriented to person, place, and time.  Skin: No rash noted. No erythema.  2 lower thoracic skin tags under breasts  Psychiatric: She has a normal mood and affect. Her behavior is normal.          Assessment & Plan:

## 2013-02-28 NOTE — Assessment & Plan Note (Signed)
Sees Dr Larna Daughters

## 2013-02-28 NOTE — Assessment & Plan Note (Signed)
BP Readings from Last 3 Encounters:  02/28/13 148/80  02/09/13 123/80  01/29/13 136/74   Always has white coat HTN No changes for now

## 2013-03-02 ENCOUNTER — Encounter: Payer: Self-pay | Admitting: *Deleted

## 2013-03-02 ENCOUNTER — Encounter: Payer: Worker's Compensation | Admitting: Physical Medicine and Rehabilitation

## 2013-03-06 ENCOUNTER — Encounter
Payer: Worker's Compensation | Attending: Physical Medicine and Rehabilitation | Admitting: Physical Medicine and Rehabilitation

## 2013-03-06 ENCOUNTER — Encounter: Payer: Self-pay | Admitting: Physical Medicine and Rehabilitation

## 2013-03-06 VITALS — BP 166/101 | HR 72 | Resp 16 | Ht 68.0 in | Wt 213.0 lb

## 2013-03-06 DIAGNOSIS — L299 Pruritus, unspecified: Secondary | ICD-10-CM | POA: Insufficient documentation

## 2013-03-06 DIAGNOSIS — T43205A Adverse effect of unspecified antidepressants, initial encounter: Secondary | ICD-10-CM | POA: Insufficient documentation

## 2013-03-06 DIAGNOSIS — G8929 Other chronic pain: Secondary | ICD-10-CM | POA: Insufficient documentation

## 2013-03-06 DIAGNOSIS — G9050 Complex regional pain syndrome I, unspecified: Secondary | ICD-10-CM

## 2013-03-06 DIAGNOSIS — G90519 Complex regional pain syndrome I of unspecified upper limb: Secondary | ICD-10-CM | POA: Insufficient documentation

## 2013-03-06 DIAGNOSIS — M79609 Pain in unspecified limb: Secondary | ICD-10-CM | POA: Insufficient documentation

## 2013-03-06 MED ORDER — TRAMADOL HCL ER 100 MG PO TB24: 100.0000 mg | ORAL_TABLET | Freq: Every day | ORAL | Status: DC

## 2013-03-06 MED ORDER — HYDROCODONE-ACETAMINOPHEN 7.5-500 MG PO TABS: 1.0000 | ORAL_TABLET | Freq: Every day | ORAL | Status: DC

## 2013-03-06 MED ORDER — TRAMADOL HCL 50 MG PO TABS: 50.0000 mg | ORAL_TABLET | Freq: Every day | ORAL | Status: DC

## 2013-03-06 NOTE — Progress Notes (Signed)
Subjective:    Patient ID: [name], female    DOB: 02/22/1966, 47 y.o.   MRN: [id number]  HPI The patient complains about chronic left arm pain, after an accident at her workplace Dec. 1994. The patient also complains about numbness and tingling in her left arm.  The problem is controlled by medications most of the time. She also states, that she is doing her aquatic exercises again, which really helps to alleviate her symptoms.   The patient reports, that she had an allergic reaction to the generic Cymbalta, mainly itching. She is asking me, whether I can prescribe the brand name medication,which she tolerates well, which I did. The patient reports, that she had a surgical hernia repair on April the 9th, by Dr. Magnus Ivan, which is healing well.  Pain Inventory Average Pain 8 Pain Right Now 9 My pain is constant, sharp, burning, stabbing, tingling and aching  In the last 24 hours, has pain interfered with the following? General activity na Relation with others na Enjoyment of life na What TIME of day is your pain at its worst? constant Sleep (in general) Poor  Pain is worse with: walking, bending, sitting, inactivity, standing and some activites Pain improves with: rest, heat/ice, therapy/exercise, pacing activities, medication and TENS Relief from Meds: 6  Mobility ability to climb steps?  yes do you drive?  yes  Function disabled: date disabled 33 I need assistance with the following:  household duties and shopping  Neuro/Psych weakness numbness tremor tingling trouble walking spasms dizziness anxiety  Prior Studies Any changes since last visit?  no  Physicians involved in your care Any changes since last visit?  no   Family History  Problem Relation Age of Onset  . Cancer Mother     lung  . Hypertension Mother   . Cancer Father     colon  . Hypertension Sister   . Birth defects Maternal Grandmother     breast  . Birth defects Paternal  Grandmother     uterine, stomach, lung   History   Social History  . Marital Status: Married    Spouse Name: N/A    Number of Children: 2  . Years of Education: N/A   Occupational History  . Disabled due to RSD    Social History Main Topics  . Smoking status: Never Smoker   . Smokeless tobacco: Never Used  . Alcohol Use: Yes     Comment: 01/24/2013 "drink or 2 once or /twice/year, if that"  . Drug Use: No  . Sexually Active: Not Currently   Other Topics Concern  . None   Social History Narrative  . None   Past Surgical History  Procedure Laterality Date  . Oophorectomy Right 2009  . Laparoscopic incisional / umbilical / ventral hernia repair  01/24/2013    IHR w/mesh/notes  . Hernia repair    . Tonsillectomy  1990's  . Nasal septum surgery  1980's?  . Appendectomy  ~ 06/2007  . Bladder repair  ~ 06/2007    "same day after bladder lift" (01/24/2013)  . Bladder suspension  ~ 06/2007  . Vaginal hysterectomy  ` 06/2007  . Diagnostic laparoscopy  1990's & ~ 2000    "I've had a couple; for endometrosis" (01/24/2013)  . Incisional hernia repair N/A 01/24/2013    Procedure: LAPAROSCOPIC INCISIONAL HERNIA;  Surgeon: Shelly Rubenstein, MD;  Location: [facility] OR;  Service: General;  Laterality: N/A;  . Insertion of mesh N/A 01/24/2013    Procedure:  INSERTION OF MESH;  Surgeon: Shelly Rubenstein, MD;  Location: [facility] OR;  Service: General;  Laterality: N/A;   Past Medical History  Diagnosis Date  . Anxiety   . Asthma   . History of colonic polyps   . GERD (gastroesophageal reflux disease)   . Hypertension   . Allergy   . Hyperlipidemia   . ASCVD (arteriosclerotic cardiovascular disease)   . Obesity (BMI 30.0-34.9)   . Sleep apnea     NO MACHINE RECOMMENDED  . TIA (transient ischemic attack) ~ 2009  . Migraines   . Daily headache     "depends on the season" (01/24/2013)  . Arthritis     "just the norm" (01/24/2013)  . Fibromyalgia   . RSD (reflex sympathetic dystrophy)    BP 166/101   Pulse 72  Resp 16  Ht 5\' 8"  (1.727 m)  Wt 213 lb (96.616 kg)  BMI 32.39 kg/m2  SpO2 98%     Review of Systems  Constitutional: Positive for diaphoresis.  Respiratory: Positive for cough.   Gastrointestinal: Positive for nausea and constipation.  Musculoskeletal: Positive for gait problem.  Neurological: Positive for dizziness, tremors, weakness and numbness.       Spasms  Psychiatric/Behavioral: Positive for agitation.  All other systems reviewed and are negative.       Objective:   Physical Exam Constitutional: She is oriented to person, place, and time. She appears well-developed and well-nourished.  HENT:  Head: Normocephalic.  Neck: Neck supple.  Musculoskeletal: She exhibits extreme tenderness, even to minimal touch.  Neurological: She is alert and oriented to person, place, and time.  Skin: Skin is warm and dry. Incisions from abdominal hernia repair well healed. Psychiatric: She has a normal mood and affect.  Symmetric normal motor tone is noted throughout. Normal muscle bulk. Muscle testing reveals 5/5 muscle strength of the upper extremity, except left UE patient is very hesitant to move against any resistance because of fear of pain, could not test completely, and 5/5 of the lower extremity. Full range of motion in upper and lower extremities, except left wrist and fingers, restrited motion mainly because of severe pain. ROM of spine is mildly restricted.  DTR in the upper and lower extremity are present and symmetric 2+. No clonus is noted.  Patient arises from chair without difficulty. Narrow based gait with normal arm swing bilateral , able to walk on heels and toes . Tandem walk is stable. No pronator drift. Rhomberg negative.        Assessment & Plan:  1. Reflex sympathetic dystrophy left upper extremity. A work related injury in the 1990s. Continue current medication management.  2. Lower remedy pain in the left foot.  3. She had an allergic reaction to the  generic Cymbalta, mainly itching. She is asking me, whether I can prescribe the brand name medication,which she tolerates well.Prescribed Cymbalta brand only at the last visit, advised patient that she should take the generic back to her pharmacy, when she is picking up the brand name medication. PLAN  Patient is managing her symptoms with exercising in the pool and on land and with medication. She should continue.    Follow up in 3 month.

## 2013-03-06 NOTE — Patient Instructions (Signed)
Continue with your aquatic exercises 

## 2013-03-09 ENCOUNTER — Other Ambulatory Visit: Payer: Self-pay

## 2013-03-09 MED ORDER — HYDROCODONE-ACETAMINOPHEN 7.5-325 MG PO TABS: 1.0000 | ORAL_TABLET | Freq: Every evening | ORAL | Status: DC | PRN

## 2013-03-15 ENCOUNTER — Other Ambulatory Visit: Payer: Self-pay | Admitting: *Deleted

## 2013-03-15 MED ORDER — TIZANIDINE HCL 4 MG PO TABS: 4.0000 mg | ORAL_TABLET | Freq: Four times a day (QID) | ORAL | Status: DC

## 2013-03-22 ENCOUNTER — Emergency Department (INDEPENDENT_AMBULATORY_CARE_PROVIDER_SITE_OTHER)
Admission: EM | Admit: 2013-03-22 | Discharge: 2013-03-22 | Disposition: A | Discharge status: Home / Self Care | Payer: BC Managed Care – PPO | Source: Home / Self Care | Attending: Emergency Medicine | Admitting: Emergency Medicine

## 2013-03-22 ENCOUNTER — Other Ambulatory Visit: Payer: Self-pay

## 2013-03-22 ENCOUNTER — Telehealth: Payer: Self-pay | Admitting: Internal Medicine

## 2013-03-22 ENCOUNTER — Encounter (HOSPITAL_COMMUNITY): Payer: Self-pay | Admitting: Emergency Medicine

## 2013-03-22 DIAGNOSIS — J0100 Acute maxillary sinusitis, unspecified: Secondary | ICD-10-CM

## 2013-03-22 DIAGNOSIS — H6590 Unspecified nonsuppurative otitis media, unspecified ear: Secondary | ICD-10-CM

## 2013-03-22 MED ORDER — FLUTICASONE PROPIONATE 50 MCG/ACT NA SUSP: 2.0000 | Freq: Every day | NASAL | Status: DC

## 2013-03-22 MED ORDER — AMOXICILLIN-POT CLAVULANATE 875-125 MG PO TABS: 1.0000 | ORAL_TABLET | Freq: Two times a day (BID) | ORAL | Status: DC

## 2013-03-22 MED ORDER — HYDROCOD POLST-CHLORPHEN POLST 10-8 MG/5ML PO LQCR: 5.0000 mL | Freq: Every evening | ORAL | Status: DC | PRN

## 2013-03-22 MED ORDER — ESOMEPRAZOLE MAGNESIUM 40 MG PO CPDR: 40.0000 mg | DELAYED_RELEASE_CAPSULE | Freq: Two times a day (BID) | ORAL | Status: DC

## 2013-03-22 MED ORDER — GUAIFENESIN ER 1200 MG PO TB12: 1.0000 | ORAL_TABLET | Freq: Two times a day (BID) | ORAL | Status: DC

## 2013-03-22 MED ORDER — METHYLPREDNISOLONE 4 MG PO KIT: PACK | ORAL | Status: DC

## 2013-03-22 NOTE — Telephone Encounter (Signed)
Patient Information:  Caller Name: Rebekkah  Phone: [phone]  Patient: [name]  Gender: Female  DOB: 06-30-1966  Age: 47 Years  PCP: Viviana [facility])  Pregnant: No  Office Follow Up:  Does the office need to follow up with this patient?: No  Instructions For The Office: N/A  RN Note:  Symptoms of sinus congestion present with severe sore throat, facial pressure, "teeth hurt", and headache.  Yellow nasal drainge and phlem. Sore throat pain rated 9/10; worse on right side. Spitting saliva at times. No appointments remain in office so advised to go to [facility] now.    Symptoms  Reason For Call & Symptoms: Sore throat ("like razor blades") with croupy cough and congestion.  Reviewed Health History In EMR: Yes  Reviewed Medications In EMR: Yes  Reviewed Allergies In EMR: Yes  Reviewed Surgeries / Procedures: Yes  Date of Onset of Symptoms: 03/20/2013  Treatments Tried: Coriciden cold remedy  Treatments Tried Worked: Yes OB / GYN:  LMP: Unknown  Guideline(s) Used:  Sore Throat  Disposition Per Guideline:   Go to ED Now (or to Office with PCP Approval)  Reason For Disposition Reached:   Drooling or spitting out saliva (because can't swallow)  Advice Given:  For Relief of Sore Throat Pain:  Sip warm chicken broth or apple juice.  Suck on hard candy or a throat lozenge (over-the-counter).  Gargle warm salt water 3 times daily (1 teaspoon of salt in 8 oz or 240 ml of warm water).  Pain Medicines:  Ibuprofen (e.g., Motrin, Advil):  Take 400 mg (two 200 mg pills) by mouth every 6 hours.  Another choice is to take 600 mg (three 200 mg pills) by mouth every 8 hours.  The most you should take each day is 1,200 mg (six 200 mg pills), unless your doctor has told you to take more.  Soft Diet:   Cold drinks and milk shakes are especially good (Reason: swollen tonsils can make some foods hard to swallow).  Liquids:  Adequate liquid intake is important to  prevent dehydration. Drink 6-8 glasses of water per day.  Call Back If:  Sore throat is the main symptom and it lasts longer than 24 hours  Fever lasts longer than 3 days  You become worse.  Patient Will Follow Care Advice:  YES

## 2013-03-22 NOTE — ED Provider Notes (Signed)
Medical screening examination/treatment/procedure(s) were performed by non-physician practitioner and as supervising physician I was immediately available for consultation/collaboration.  Raynald Blend, MD 03/22/13 2106

## 2013-03-22 NOTE — Telephone Encounter (Signed)
.  left message to have patient return my call.  

## 2013-03-22 NOTE — ED Notes (Signed)
Pt c/o sinus pressure and pain. Dry nonproductive barky cough. Diarrhea yesterday.  Headache and chills. Pt has tried several otc meds with out relief.  Symptoms present x 3 to 4 days.

## 2013-03-22 NOTE — Telephone Encounter (Signed)
Did they call here first?  I would have added her on at 4:45PM

## 2013-03-22 NOTE — ED Provider Notes (Signed)
History     CSN: [id number]  Arrival date & time 03/22/13  1407   First MD Initiated Contact with Patient 03/22/13 1542      Chief Complaint  Patient presents with  . Sore Throat    sore throat with sinus pressure and pain. barky cough. and chills    (Consider location/radiation/quality/duration/timing/severity/associated sxs/prior treatment) HPI Comments: Pt presents c/o 4 days of progressively worsening sore throat, sinus/facial pain, ear pain/pressure, and dry nonproductive cough.  She has also had some subjective fever and chills at home.  She has been taking coricidin HBP without any relief of her symptoms.  She denies measured fever, NVD, rash, pleuritic pain.    Patient is a 47 y.o. female presenting with pharyngitis.  Sore Throat Pertinent negatives include no chest pain, no abdominal pain and no shortness of breath.    Past Medical History  Diagnosis Date  . Anxiety   . Asthma   . History of colonic polyps   . GERD (gastroesophageal reflux disease)   . Hypertension   . Allergy   . Hyperlipidemia   . ASCVD (arteriosclerotic cardiovascular disease)   . Obesity (BMI 30.0-34.9)   . Sleep apnea     NO MACHINE RECOMMENDED  . TIA (transient ischemic attack) ~ 2009  . Migraines   . Daily headache     "depends on the season" (01/24/2013)  . Arthritis     "just the norm" (01/24/2013)  . Fibromyalgia   . RSD (reflex sympathetic dystrophy)     Past Surgical History  Procedure Laterality Date  . Oophorectomy Right 2009  . Laparoscopic incisional / umbilical / ventral hernia repair  01/24/2013    IHR w/mesh/notes  . Hernia repair    . Tonsillectomy  1990's  . Nasal septum surgery  1980's?  . Appendectomy  ~ 06/2007  . Bladder repair  ~ 06/2007    "same day after bladder lift" (01/24/2013)  . Bladder suspension  ~ 06/2007  . Vaginal hysterectomy  ` 06/2007  . Diagnostic laparoscopy  1990's & ~ 2000    "I've had a couple; for endometrosis" (01/24/2013)  . Incisional hernia  repair N/A 01/24/2013    Procedure: LAPAROSCOPIC INCISIONAL HERNIA;  Surgeon: Shelly Rubenstein, MD;  Location: [facility] OR;  Service: General;  Laterality: N/A;  . Insertion of mesh N/A 01/24/2013    Procedure: INSERTION OF MESH;  Surgeon: Shelly Rubenstein, MD;  Location: [facility] OR;  Service: General;  Laterality: N/A;    Family History  Problem Relation Age of Onset  . Cancer Mother     lung  . Hypertension Mother   . Cancer Father     colon  . Hypertension Sister   . Birth defects Maternal Grandmother     breast  . Birth defects Paternal Grandmother     uterine, stomach, lung    History  Substance Use Topics  . Smoking status: Never Smoker   . Smokeless tobacco: Never Used  . Alcohol Use: Yes     Comment: 01/24/2013 "drink or 2 once or /twice/year, if that"    OB History   Grav Para Term Preterm Abortions TAB SAB Ect Mult Living                  Review of Systems  Constitutional: Negative for fever and chills.  HENT: Positive for ear pain, congestion, sore throat, trouble swallowing, sinus pressure and ear discharge. Negative for voice change and postnasal drip.   Eyes: Negative for visual  disturbance.  Respiratory: Positive for cough. Negative for shortness of breath.   Cardiovascular: Negative for chest pain, palpitations and leg swelling.  Gastrointestinal: Negative for nausea, vomiting and abdominal pain.  Endocrine: Negative for polydipsia and polyuria.  Genitourinary: Negative for dysuria, urgency and frequency.  Musculoskeletal: Negative for myalgias and arthralgias.  Skin: Negative for rash.  Neurological: Negative for dizziness, weakness and light-headedness.    Allergies  Demerol; Cymbalta; Diazepam; and Meperidine hcl  Home Medications   Current Outpatient Rx  Name  Route  Sig  Dispense  Refill  . esomeprazole (NEXIUM) 40 MG capsule   Oral   Take 1 capsule (40 mg total) by mouth 2 (two) times daily.   60 capsule   11   . loratadine (CLARITIN) 10 MG  tablet   Oral   Take 10 mg by mouth daily.           . metoprolol tartrate (LOPRESSOR) 25 MG tablet   Oral   Take 25 mg by mouth 2 (two) times daily.         Marland Kitchen tiZANidine (ZANAFLEX) 4 MG tablet   Oral   Take 1 tablet (4 mg total) by mouth 4 (four) times daily.   120 tablet   2   . topiramate (TOPAMAX) 200 MG tablet      TAKE 1 TABLET (200 MG TOTAL) BY MOUTH DAILY.   30 tablet   0   . traMADol (ULTRAM-ER) 100 MG 24 hr tablet   Oral   Take 1 tablet (100 mg total) by mouth daily.   90 tablet   2     90 day supply   . albuterol (PROAIR HFA) 108 (90 BASE) MCG/ACT inhaler   Inhalation   Inhale 2 puffs into the lungs every 4 (four) hours as needed.   1 Inhaler   4   . amoxicillin-clavulanate (AUGMENTIN) 875-125 MG per tablet   Oral   Take 1 tablet by mouth every 12 (twelve) hours.   20 tablet   0   . chlorpheniramine-HYDROcodone (TUSSIONEX PENNKINETIC ER) 10-8 MG/5ML LQCR   Oral   Take 5 mLs by mouth at bedtime as needed.   115 mL   0   . DULoxetine (CYMBALTA) 30 MG capsule   Oral   Take 90 mg by mouth daily.         . fluticasone (FLONASE) 50 MCG/ACT nasal spray   Nasal   Place 2 sprays into the nose daily.   1 g   2   . Guaifenesin 1200 MG TB12   Oral   Take 1 tablet (1,200 mg total) by mouth 2 (two) times daily.   30 each   0   . HYDROcodone-acetaminophen (NORCO) 7.5-325 MG per tablet   Oral   Take 1 tablet by mouth at bedtime as needed for pain. Must last 30 days.   30 tablet   0   . ibuprofen (ADVIL,MOTRIN) 600 MG tablet      TAKE 1 TABLET (600 MG TOTAL) BY MOUTH 3 (THREE) TIMES DAILY.   90 tablet   0   . lidocaine (LIDODERM) 5 %      PLACE 1 PATCH ONTO THE SKIN DAILY. REMOVE & DISCARD PATCH WITHIN 12 HOURS OR AS DIRECTED BY MD   30 patch   0   . methylPREDNISolone (MEDROL DOSEPAK) 4 MG tablet      Use taper dose pack as directed   21 tablet   0   . oxyCODONE-acetaminophen (PERCOCET)  7.5-325 MG per tablet               .  traMADol (ULTRAM) 50 MG tablet   Oral   Take 1 tablet (50 mg total) by mouth at bedtime.   90 tablet   2     90 day supply     BP 154/100  Pulse 81  Temp(Src) 98.4 F (36.9 C) (Oral)  Resp 15  SpO2 96%  Physical Exam  Nursing note and vitals reviewed. Constitutional: She is oriented to person, place, and time. Vital signs are normal. She appears well-developed and well-nourished. No distress.  HENT:  Head: Atraumatic.  Right Ear: Tympanic membrane is injected, erythematous and bulging. A middle ear effusion is present.  Left Ear: Tympanic membrane is injected, erythematous and bulging. A middle ear effusion is present.  Mouth/Throat: Uvula is midline and mucous membranes are normal. Oropharyngeal exudate and posterior oropharyngeal erythema present.  Air fluid levels behind the TM.  Pt also has tonsillar and submandibular tender LAD.  Maxillary sinus tenderness   Eyes: EOM are normal. Pupils are equal, round, and reactive to light.  Neck: Normal range of motion. No tracheal deviation present. No thyromegaly present.  Cardiovascular: Normal rate, regular rhythm and normal heart sounds.  Exam reveals no gallop and no friction rub.   No murmur heard. Pulmonary/Chest: Effort normal and breath sounds normal. No respiratory distress. She has no wheezes. She has no rales.  Abdominal: Soft. There is no tenderness.  Lymphadenopathy:    She has cervical adenopathy.  Neurological: She is alert and oriented to person, place, and time. She has normal strength.  Skin: Skin is warm and dry. She is not diaphoretic.  Psychiatric: She has a normal mood and affect. Her behavior is normal. Judgment normal.    ED Course  Procedures (including critical care time)  Labs Reviewed  POCT RAPID STREP A ([facility] ONLY)   No results found.   1. Acute maxillary sinusitis   2. Serous otitis media, bilateral       MDM  Go ahead and start ABx due to severity of SXS.  Also will treat  symptomatically.  F/u if not resolving within 5-7 days or sooner if worsening.     Meds ordered this encounter  Medications  . amoxicillin-clavulanate (AUGMENTIN) 875-125 MG per tablet    Sig: Take 1 tablet by mouth every 12 (twelve) hours.    Dispense:  20 tablet    Refill:  0  . methylPREDNISolone (MEDROL DOSEPAK) 4 MG tablet    Sig: Use taper dose pack as directed    Dispense:  21 tablet    Refill:  0  . Guaifenesin 1200 MG TB12    Sig: Take 1 tablet (1,200 mg total) by mouth 2 (two) times daily.    Dispense:  30 each    Refill:  0  . chlorpheniramine-HYDROcodone (TUSSIONEX PENNKINETIC ER) 10-8 MG/5ML LQCR    Sig: Take 5 mLs by mouth at bedtime as needed.    Dispense:  115 mL    Refill:  0  . fluticasone (FLONASE) 50 MCG/ACT nasal spray    Sig: Place 2 sprays into the nose daily.    Dispense:  1 g    Refill:  2           Graylon Good, PA-C 03/22/13 1630

## 2013-03-23 NOTE — Telephone Encounter (Signed)
.  left message to have patient return my call.  

## 2013-03-24 LAB — CULTURE, GROUP A STREP

## 2013-03-25 NOTE — ED Notes (Signed)
Chart review.

## 2013-04-03 ENCOUNTER — Other Ambulatory Visit: Payer: Self-pay | Admitting: Physical Medicine and Rehabilitation

## 2013-04-04 NOTE — Telephone Encounter (Signed)
We did not prescribe this medication, I would not have prescribed 90mg  anyhow, she has to get it from the physician who prescribed it to her, because research has shown that higher doses than 60mg  are not more effective, but can have more adverse effects.

## 2013-04-09 ENCOUNTER — Ambulatory Visit (INDEPENDENT_AMBULATORY_CARE_PROVIDER_SITE_OTHER): Payer: BC Managed Care – PPO | Admitting: Surgery

## 2013-04-09 ENCOUNTER — Other Ambulatory Visit: Payer: Self-pay | Admitting: Internal Medicine

## 2013-04-09 ENCOUNTER — Encounter (INDEPENDENT_AMBULATORY_CARE_PROVIDER_SITE_OTHER): Payer: Self-pay | Admitting: Surgery

## 2013-04-09 VITALS — BP 161/89 | HR 81 | Temp 97.5°F | Resp 16 | Ht 68.0 in | Wt 211.6 lb

## 2013-04-09 DIAGNOSIS — Z09 Encounter for follow-up examination after completed treatment for conditions other than malignant neoplasm: Secondary | ICD-10-CM

## 2013-04-09 NOTE — Progress Notes (Signed)
Subjective:     Patient ID: [name], female   DOB: 1966/08/12, 47 y.o.   MRN: [id number]  HPI She is here for another postop visit. She feels much better and has less discomfort  Review of Systems     Objective:   Physical Exam On exam, there is no evidence of recurrent hernia or seroma. She does have a rectus diastases    Assessment:     Patient stable postop     Plan:     She has resumed her normal activity. I will see her back as needed

## 2013-04-12 ENCOUNTER — Telehealth: Payer: Self-pay

## 2013-04-12 NOTE — Telephone Encounter (Signed)
Its ok to refill the lidoderm patches, she should still have refills on the tramadol, was prescribed in May with 2 refills

## 2013-04-12 NOTE — Telephone Encounter (Signed)
Patient request lidoderm patch and tramadol refill.  Please advise.

## 2013-04-13 MED ORDER — LIDOCAINE 5 % EX PTCH: MEDICATED_PATCH | CUTANEOUS | Status: DC

## 2013-04-13 NOTE — Telephone Encounter (Signed)
Lidoderm refilled.

## 2013-04-19 ENCOUNTER — Encounter: Payer: Self-pay | Admitting: Obstetrics & Gynecology

## 2013-04-19 ENCOUNTER — Ambulatory Visit (INDEPENDENT_AMBULATORY_CARE_PROVIDER_SITE_OTHER): Payer: BC Managed Care – PPO | Admitting: Obstetrics & Gynecology

## 2013-04-19 ENCOUNTER — Ambulatory Visit (HOSPITAL_COMMUNITY)
Admission: RE | Admit: 2013-04-19 | Discharge: 2013-04-19 | Disposition: A | Discharge status: Home / Self Care | Payer: BC Managed Care – PPO | Source: Ambulatory Visit | Attending: Obstetrics & Gynecology | Admitting: Obstetrics & Gynecology

## 2013-04-19 VITALS — BP 151/94 | HR 79 | Ht 68.0 in | Wt 213.0 lb

## 2013-04-19 DIAGNOSIS — Z0000 Encounter for general adult medical examination without abnormal findings: Secondary | ICD-10-CM

## 2013-04-19 DIAGNOSIS — R32 Unspecified urinary incontinence: Secondary | ICD-10-CM

## 2013-04-19 DIAGNOSIS — Z01419 Encounter for gynecological examination (general) (routine) without abnormal findings: Secondary | ICD-10-CM

## 2013-04-19 DIAGNOSIS — Z1231 Encounter for screening mammogram for malignant neoplasm of breast: Secondary | ICD-10-CM | POA: Insufficient documentation

## 2013-04-19 LAB — POCT URINALYSIS DIPSTICK
Leukocytes, UA: NEGATIVE
Nitrite, UA: NEGATIVE
Urobilinogen, UA: 0.2
pH, UA: 6.5

## 2013-04-19 NOTE — Progress Notes (Signed)
  Subjective:    [name] is a 47 y.o. female who presents for an annual exam. The patient has complaints of urinary incontinence and dyspareunia.  It is unchanged from the pat year.  She was referred to urology in 2013 but she lost insurance coverage.  Pt has insurance coverage and will dee Dr. Alison Murray. The patient is sexually active. GYN screening history: s/p hyst--no paps needed. The patient wears seatbelts: yes. The patient participates in regular exercise: no. Has the patient ever been transfused or tattooed?: not asked. The patient reports that there is not domestic violence in her life.   Menstrual History: OB History   Grav Para Term Preterm Abortions TAB SAB Ect Mult Living                  No LMP recorded. Patient has had a hysterectomy.    The following portions of the patient's history were reviewed and updated as appropriate: allergies, current medications, past family history, past medical history, past social history, past surgical history and problem list.  Review of Systems Pertinent items are noted in HPI.    Objective:   Filed Vitals:   04/19/13 0928  BP: 151/94  Pulse: 79  Height: 5' 8"  (1.727 m)  Weight: 213 lb (96.616 kg)      Vitals:  WNL General appearance: alert, cooperative and no distress Head: Normocephalic, without obvious abnormality, atraumatic Eyes: negative Throat: lips, mucosa, and tongue normal; teeth and gums normal Lungs: clear to auscultation bilaterally Breasts: normal appearance, no masses or tenderness, No nipple retraction or dimpling, No nipple discharge or bleeding Heart: regular rate and rhythm Abdomen: soft, non-tender; bowel sounds normal; no masses,  no organomegaly Pelvic: Uterus and right ovary surgically absent.  Left ovary not felt but non tender.  Pt has mild tenderness over bladder.  Mild descensus of bladder.  Vaginal vault well supported.  No vaginal discharge. Extremities: no edema, redness or tenderness in the  calves or thighs Skin: no lesions or rash Lymph nodes: Axillary adenopathy: none    .    Assessment:    Healthy female exam.  Urinary incontinence Dysapreunia   Plan:     All questions answered. Breast self exam technique reviewed and patient encouraged to perform self-exam monthly. Follow up as needed. Mammogram.  Referral to Urology

## 2013-04-25 ENCOUNTER — Other Ambulatory Visit: Payer: Self-pay | Admitting: Obstetrics & Gynecology

## 2013-04-25 DIAGNOSIS — R928 Other abnormal and inconclusive findings on diagnostic imaging of breast: Secondary | ICD-10-CM

## 2013-05-04 ENCOUNTER — Ambulatory Visit
Admission: RE | Admit: 2013-05-04 | Discharge: 2013-05-04 | Disposition: A | Discharge status: Home / Self Care | Payer: BC Managed Care – PPO | Source: Ambulatory Visit | Attending: Obstetrics & Gynecology | Admitting: Obstetrics & Gynecology

## 2013-05-04 ENCOUNTER — Other Ambulatory Visit: Payer: Self-pay | Admitting: Physical Medicine and Rehabilitation

## 2013-05-04 DIAGNOSIS — R928 Other abnormal and inconclusive findings on diagnostic imaging of breast: Secondary | ICD-10-CM

## 2013-05-09 ENCOUNTER — Telehealth: Payer: Self-pay | Admitting: *Deleted

## 2013-05-09 NOTE — Telephone Encounter (Signed)
It is ok to refill, I wrote last prescription in May, she has an allergic reaction against the generic one

## 2013-05-09 NOTE — Telephone Encounter (Signed)
[facility] calling to request refill of Cymbalta. Last refill in January(?) by you for Name Brand Cymbalta. Please clarify if you want to refill this.

## 2013-05-10 MED ORDER — DULOXETINE HCL 30 MG PO CPEP: 90.0000 mg | ORAL_CAPSULE | Freq: Every day | ORAL | Status: DC

## 2013-05-10 NOTE — Telephone Encounter (Signed)
I called to pharmacy to verify dose and last rx, which was 12/06/12 , 30mg  take 3 capsules daily. Reordered, #270 90 day supply

## 2013-05-11 ENCOUNTER — Other Ambulatory Visit: Payer: Self-pay

## 2013-05-11 MED ORDER — HYDROCODONE-ACETAMINOPHEN 7.5-325 MG PO TABS: 1.0000 | ORAL_TABLET | Freq: Every evening | ORAL | Status: DC | PRN

## 2013-05-11 NOTE — Telephone Encounter (Signed)
Patient request hydrocodone refill. [facility].  Left message informing patient.

## 2013-05-15 ENCOUNTER — Telehealth: Payer: Self-pay

## 2013-05-15 NOTE — Telephone Encounter (Signed)
Okay to add losartan 50mg  daily #30 x 1 She needs an appt in about 1 month to recheck BP here and check some labs  Have her call if any problems but this is usually well tolerated

## 2013-05-15 NOTE — Telephone Encounter (Signed)
Pt said has seen 2 doctors recently and BP 170/107; pt said she does not have white coat syndrome and when applied for life insurance and the person came to her home her BP was elevated there also. Pt presently taking Lopressor 25 mg twice a day. No CP or SOB but has ongoing h/a and dizziness. Pt wants to know if med needs to be changed. Pt would rather not make appt. [place] request cb.

## 2013-05-16 ENCOUNTER — Other Ambulatory Visit: Payer: Self-pay | Admitting: *Deleted

## 2013-05-16 MED ORDER — LOSARTAN POTASSIUM 50 MG PO TABS: 50.0000 mg | ORAL_TABLET | Freq: Every day | ORAL | Status: DC

## 2013-05-16 MED ORDER — TRAMADOL HCL ER 100 MG PO TB24: 100.0000 mg | ORAL_TABLET | Freq: Every day | ORAL | Status: DC

## 2013-05-16 NOTE — Telephone Encounter (Signed)
Spoke with patient and advised results, rx sent to pharmacy by e-script Pt was very upset that Dr. Silvio Pate is not taking her BP seriously, pt states she will come in next month and would like to discuss her BP in detail with Dr.Letvak. Pt also states she does not have whitecoat hypertension, she's very upset about that diag.

## 2013-05-17 NOTE — Telephone Encounter (Signed)
Discussed the issues with her Really hasn't been that bad here lately but moderately elevated other places  Will start the losartan as planned Increase or add HCTZ at 1 month visit if still not well controlled She will bring in her recorded BPs then

## 2013-06-05 ENCOUNTER — Other Ambulatory Visit: Payer: Self-pay | Admitting: Physical Medicine and Rehabilitation

## 2013-06-11 ENCOUNTER — Other Ambulatory Visit: Payer: Self-pay | Admitting: Physical Medicine and Rehabilitation

## 2013-06-11 ENCOUNTER — Ambulatory Visit: Payer: Self-pay | Admitting: Physical Medicine and Rehabilitation

## 2013-06-15 ENCOUNTER — Ambulatory Visit: Payer: Self-pay | Admitting: Physical Medicine and Rehabilitation

## 2013-06-15 ENCOUNTER — Encounter
Payer: Worker's Compensation | Attending: Physical Medicine and Rehabilitation | Admitting: Physical Medicine and Rehabilitation

## 2013-06-15 ENCOUNTER — Encounter: Payer: Self-pay | Admitting: Physical Medicine and Rehabilitation

## 2013-06-15 VITALS — BP 148/98 | HR 99 | Resp 14 | Ht 68.0 in | Wt 211.4 lb

## 2013-06-15 DIAGNOSIS — R6889 Other general symptoms and signs: Secondary | ICD-10-CM | POA: Insufficient documentation

## 2013-06-15 DIAGNOSIS — M79609 Pain in unspecified limb: Secondary | ICD-10-CM | POA: Insufficient documentation

## 2013-06-15 DIAGNOSIS — G90519 Complex regional pain syndrome I of unspecified upper limb: Secondary | ICD-10-CM | POA: Insufficient documentation

## 2013-06-15 DIAGNOSIS — G90512 Complex regional pain syndrome I of left upper limb: Secondary | ICD-10-CM

## 2013-06-15 MED ORDER — HYDROCODONE-ACETAMINOPHEN 7.5-325 MG PO TABS: ORAL_TABLET | ORAL | Status: DC

## 2013-06-15 NOTE — Progress Notes (Signed)
Subjective:    Patient ID: [name], female    DOB: [birth year], 47 y.o.   MRN: [id number]  HPI The patient complains about chronic left arm pain, after an accident at her workplace Dec. 1994. The patient also complains about numbness and tingling in her left arm.  The problem is controlled by medications most of the time. She also states, that she is doing her aquatic exercises again, which really helps to alleviate her symptoms.  The patient reports, that she had an allergic reaction to the generic Cymbalta, mainly itching. She is asking me, whether I can prescribe the brand name medication,which she tolerates well, which I did.  The patient reports, that she had a surgical hernia repair on April the 9th, by Dr. Magnus Ivan, which has healed well. Patient complains about spasms in her throat.  Pain Inventory Average Pain 8 Pain Right Now 8 My pain is intermittent, constant, sharp, burning, dull, stabbing, tingling and aching  In the last 24 hours, has pain interfered with the following? General activity 8 Relation with others 7 Enjoyment of life 7 What TIME of day is your pain at its worst? all day Sleep (in general) Fair  Pain is worse with: walking, bending, sitting, inactivity and standing Pain improves with: rest, heat/ice, therapy/exercise, pacing activities, medication and TENS Relief from Meds: 7  Mobility walk without assistance ability to climb steps?  yes do you drive?  yes  Function disabled: date disabled na I need assistance with the following:  meal prep, household duties and shopping  Neuro/Psych bladder control problems weakness numbness tremor tingling trouble walking spasms depression anxiety loss of taste or smell  Prior Studies Any changes since last visit?  no  Physicians involved in your care Any changes since last visit?  no   Family History  Problem Relation Age of Onset  . Cancer Mother     lung  . Hypertension Mother   .  Cancer Father     colon  . Hypertension Sister   . Birth defects Maternal Grandmother     breast  . Birth defects Paternal Grandmother     uterine, stomach, lung   History   Social History  . Marital Status: Married    Spouse Name: N/A    Number of Children: 2  . Years of Education: N/A   Occupational History  . Disabled due to RSD    Social History Main Topics  . Smoking status: Never Smoker   . Smokeless tobacco: Never Used  . Alcohol Use: Yes     Comment: 01/24/2013 "drink or 2 once or /twice/year, if that"  . Drug Use: No  . Sexual Activity: Not Currently   Other Topics Concern  . None   Social History Narrative  . None   Past Surgical History  Procedure Laterality Date  . Oophorectomy Right 2009  . Laparoscopic incisional / umbilical / ventral hernia repair  01/24/2013    IHR w/mesh/notes  . Hernia repair    . Tonsillectomy  1990's  . Nasal septum surgery  1980's?  . Appendectomy  ~ 06/2007  . Bladder repair  ~ 06/2007    "same day after bladder lift" (01/24/2013)  . Bladder suspension  ~ 06/2007  . Vaginal hysterectomy  ` 06/2007  . Diagnostic laparoscopy  1990's & ~ 2000    "I've had a couple; for endometrosis" (01/24/2013)  . Incisional hernia repair N/A 01/24/2013    Procedure: LAPAROSCOPIC INCISIONAL HERNIA;  Surgeon: Shelly Rubenstein, MD;  Location: [facility] OR;  Service: General;  Laterality: N/A;  . Insertion of mesh N/A 01/24/2013    Procedure: INSERTION OF MESH;  Surgeon: Shelly Rubenstein, MD;  Location: [facility] OR;  Service: General;  Laterality: N/A;   Past Medical History  Diagnosis Date  . Anxiety   . Asthma   . History of colonic polyps   . GERD (gastroesophageal reflux disease)   . Hypertension   . Allergy   . Hyperlipidemia   . ASCVD (arteriosclerotic cardiovascular disease)   . Obesity (BMI 30.0-34.9)   . Sleep apnea     NO MACHINE RECOMMENDED  . TIA (transient ischemic attack) ~ 2009  . Migraines   . Daily headache     "depends on the season"  (01/24/2013)  . Arthritis     "just the norm" (01/24/2013)  . Fibromyalgia   . RSD (reflex sympathetic dystrophy)    BP 148/98  Pulse 99  Resp 14  Ht 5\' 8"  (1.727 m)  Wt 211 lb 6.4 oz (95.89 kg)  BMI 32.15 kg/m2  SpO2 96%     Review of Systems  Constitutional: Positive for diaphoresis and unexpected weight change.  HENT:       Loss of taste/smell  Respiratory: Positive for cough.   Cardiovascular: Positive for leg swelling.  Gastrointestinal: Positive for nausea, abdominal pain and constipation.  Genitourinary:       Bladder control problems  Musculoskeletal: Positive for gait problem.  Skin: Positive for rash.  Neurological: Positive for tremors, weakness and numbness.       Spasms, tingling  Psychiatric/Behavioral: Positive for dysphoric mood. The patient is nervous/anxious.   All other systems reviewed and are negative.       Objective:   Physical Exam Constitutional: She is oriented to person, place, and time. She appears well-developed and well-nourished.  HENT:  Head: Normocephalic.  Neck: Neck supple.  Musculoskeletal: She exhibits extreme tenderness, even to minimal touch.  Neurological: She is alert and oriented to person, place, and time.  Skin: Skin is warm and dry. Incisions from abdominal hernia repair well healed. Psychiatric: She has a normal mood and affect.  Symmetric normal motor tone is noted throughout. Normal muscle bulk. Muscle testing reveals 5/5 muscle strength of the upper extremity, except left UE patient is very hesitant to move against any resistance because of fear of pain, could not test completely, and 5/5 of the lower extremity. Full range of motion in upper and lower extremities, except left wrist and fingers, restrited motion mainly because of severe pain. ROM of spine is mildly restricted.  DTR in the upper and lower extremity are present and symmetric 2+. No clonus is noted.  Patient arises from chair without difficulty. Narrow based  gait with normal arm swing bilateral , able to walk on heels and toes . Tandem walk is stable. No pronator drift. Rhomberg negative.        Assessment & Plan:  1. Reflex sympathetic dystrophy left upper extremity. A work related injury in the 1990s. Continue current medication management.  2. Lower remedy pain in the left foot.  3. She had an allergic reaction to the generic Cymbalta, mainly itching. She is asking me, whether I can prescribe the brand name medication,which she tolerates well.Prescribed Cymbalta brand only at the last visit, advised patient that she should take the generic back to her pharmacy, when she is picking up the brand name medication, which she did. 4. Throat spasms, sometimes triggered by food or GERD, sounds  like Achalasia, advised patient to make an appointment with ENT, and chew her food well, and do not swallow big pieces of food. PLAN  Patient is managing her symptoms with exercising in the pool and on land and with medication. She should continue.  Follow up in 3 month.

## 2013-06-15 NOTE — Patient Instructions (Signed)
Continue with your aquatic therapy. 

## 2013-06-20 ENCOUNTER — Encounter: Payer: Self-pay | Admitting: Internal Medicine

## 2013-06-20 ENCOUNTER — Ambulatory Visit (INDEPENDENT_AMBULATORY_CARE_PROVIDER_SITE_OTHER): Payer: BC Managed Care – PPO | Admitting: Internal Medicine

## 2013-06-20 VITALS — BP 166/100 | HR 90 | Temp 98.4°F | Wt 212.0 lb

## 2013-06-20 DIAGNOSIS — R0989 Other specified symptoms and signs involving the circulatory and respiratory systems: Secondary | ICD-10-CM | POA: Insufficient documentation

## 2013-06-20 DIAGNOSIS — R09A2 Foreign body sensation, throat: Secondary | ICD-10-CM | POA: Insufficient documentation

## 2013-06-20 DIAGNOSIS — R198 Other specified symptoms and signs involving the digestive system and abdomen: Secondary | ICD-10-CM

## 2013-06-20 DIAGNOSIS — I10 Essential (primary) hypertension: Secondary | ICD-10-CM

## 2013-06-20 DIAGNOSIS — F458 Other somatoform disorders: Secondary | ICD-10-CM

## 2013-06-20 MED ORDER — LOSARTAN POTASSIUM-HCTZ 100-25 MG PO TABS: 1.0000 | ORAL_TABLET | Freq: Every day | ORAL | Status: DC

## 2013-06-20 NOTE — Assessment & Plan Note (Signed)
BP Readings from Last 3 Encounters:  06/20/13 166/100  06/15/13 148/98  04/19/13 151/94   Repeat 156/96 on left Will increase the losartan and add HCTZ

## 2013-06-20 NOTE — Assessment & Plan Note (Signed)
Already on PPI bid Sounds like laryngeal spasm---likely acid related She will contact her GI and get in to review this with him

## 2013-06-20 NOTE — Progress Notes (Signed)
Subjective:    Patient ID: [name], female    DOB: 1966-02-04, 47 y.o.   MRN: [id number]  HPI Doing okay on the losartan No adverse reactions Hasn't checked her BP on her own  Some headaches--not new No chest pain Gets occasional sharp pain in back---wonders if it is her lungs. No SOB though  Notices some spasms in throat First happened when she was asleep Occasionally notes when eating (but not always)---gets sense of spasm in airway and trouble moving air Has awoken from sleep with "burning" sensation in throat--but not recently Already on nexium bid   Current Outpatient Prescriptions on File Prior to Visit  Medication Sig Dispense Refill  . albuterol (PROAIR HFA) 108 (90 BASE) MCG/ACT inhaler Inhale 2 puffs into the lungs every 4 (four) hours as needed.  1 Inhaler  4  . DULoxetine (CYMBALTA) 30 MG capsule Take 3 capsules (90 mg total) by mouth daily.  270 capsule  0  . esomeprazole (NEXIUM) 40 MG capsule Take 1 capsule (40 mg total) by mouth 2 (two) times daily.  60 capsule  11  . HYDROcodone-acetaminophen (NORCO) 7.5-325 MG per tablet TAKE 1 TABLET BY MOUTH AT BEDTIME AS NEEDED  30 tablet  0  . ibuprofen (ADVIL,MOTRIN) 600 MG tablet TAKE 1 TABLET (600 MG TOTAL) BY MOUTH 3 (THREE) TIMES DAILY.  90 tablet  0  . lidocaine (LIDODERM) 5 % PLACE 1 PATCH ONTO THE SKIN DAILY. REMOVE & DISCARD PATCH WITHIN 12 HOURS OR AS DIRECTED BY MD  30 patch  0  . loratadine (CLARITIN) 10 MG tablet Take 10 mg by mouth daily.        Marland Kitchen losartan (COZAAR) 50 MG tablet Take 1 tablet (50 mg total) by mouth daily.  90 tablet  0  . tiZANidine (ZANAFLEX) 4 MG tablet Take 1 tablet (4 mg total) by mouth 4 (four) times daily.  120 tablet  2  . topiramate (TOPAMAX) 200 MG tablet TAKE 1 TABLET (200 MG TOTAL) BY MOUTH DAILY.  30 tablet  0  . traMADol (ULTRAM) 50 MG tablet Take 1 tablet (50 mg total) by mouth at bedtime.  90 tablet  2  . traMADol (ULTRAM-ER) 100 MG 24 hr tablet Take 1 tablet (100 mg total) by  mouth daily.  90 tablet  1   No current facility-administered medications on file prior to visit.    Allergies  Allergen Reactions  . Demerol [Meperidine] Anaphylaxis, Itching and Swelling  . Cymbalta [Duloxetine Hcl]     Generic makes her feel like she is crawling out of her skin  . Diazepam     REACTION: swelling  . Meperidine Hcl     REACTION: swelling    Past Medical History  Diagnosis Date  . Anxiety   . Asthma   . History of colonic polyps   . GERD (gastroesophageal reflux disease)   . Hypertension   . Allergy   . Hyperlipidemia   . ASCVD (arteriosclerotic cardiovascular disease)   . Obesity (BMI 30.0-34.9)   . Sleep apnea     NO MACHINE RECOMMENDED  . TIA (transient ischemic attack) ~ 2009  . Migraines   . Daily headache     "depends on the season" (01/24/2013)  . Arthritis     "just the norm" (01/24/2013)  . Fibromyalgia   . RSD (reflex sympathetic dystrophy)     Past Surgical History  Procedure Laterality Date  . Oophorectomy Right 2009  . Laparoscopic incisional / umbilical / ventral hernia repair  01/24/2013    IHR w/mesh/notes  . Hernia repair    . Tonsillectomy  1990's  . Nasal septum surgery  1980's?  . Appendectomy  ~ 06/2007  . Bladder repair  ~ 06/2007    "same day after bladder lift" (01/24/2013)  . Bladder suspension  ~ 06/2007  . Vaginal hysterectomy  ` 06/2007  . Diagnostic laparoscopy  1990's & ~ 2000    "I've had a couple; for endometrosis" (01/24/2013)  . Incisional hernia repair N/A 01/24/2013    Procedure: LAPAROSCOPIC INCISIONAL HERNIA;  Surgeon: Shelly Rubenstein, MD;  Location: [facility];  Service: General;  Laterality: N/A;  . Insertion of mesh N/A 01/24/2013    Procedure: INSERTION OF MESH;  Surgeon: Shelly Rubenstein, MD;  Location: [facility];  Service: General;  Laterality: N/A;    Family History  Problem Relation Age of Onset  . Cancer Mother     lung  . Hypertension Mother   . Cancer Father     colon  . Hypertension Sister   . Birth  defects Maternal Grandmother     breast  . Birth defects Paternal Grandmother     uterine, stomach, lung    History   Social History  . Marital Status: Married    Spouse Name: N/A    Number of Children: 2  . Years of Education: N/A   Occupational History  . Disabled due to RSD    Social History Main Topics  . Smoking status: Never Smoker   . Smokeless tobacco: Never Used  . Alcohol Use: Yes     Comment: 01/24/2013 "drink or 2 once or /twice/year, if that"  . Drug Use: No  . Sexual Activity: Not Currently   Other Topics Concern  . Not on file   Social History Narrative  . No narrative on file    Review of Systems No fever Appetite is okay Some dizziness if she moves too fast--- not new Does try pool exercises    Objective:   Physical Exam  Constitutional: She appears well-developed and well-nourished. No distress.  HENT:  Mouth/Throat: Oropharynx is clear and moist. No oropharyngeal exudate.  Neck: Normal range of motion. Neck supple. No thyromegaly present.  Cardiovascular: Normal rate, regular rhythm and normal heart sounds.  Exam reveals no gallop.   No murmur heard. Pulmonary/Chest: Effort normal and breath sounds normal. No respiratory distress. She has no wheezes. She has no rales.  Abdominal: Soft. She exhibits no distension. There is no tenderness. There is no rebound.  Musculoskeletal: She exhibits no edema and no tenderness.  Lymphadenopathy:    She has no cervical adenopathy.  Psychiatric: She has a normal mood and affect. Her behavior is normal.          Assessment & Plan:

## 2013-06-23 ENCOUNTER — Other Ambulatory Visit: Payer: Self-pay | Admitting: Physical Medicine and Rehabilitation

## 2013-07-03 ENCOUNTER — Other Ambulatory Visit: Payer: Self-pay | Admitting: Physical Medicine and Rehabilitation

## 2013-08-03 ENCOUNTER — Ambulatory Visit (INDEPENDENT_AMBULATORY_CARE_PROVIDER_SITE_OTHER): Payer: BC Managed Care – PPO | Admitting: Internal Medicine

## 2013-08-03 ENCOUNTER — Encounter: Payer: Self-pay | Admitting: Internal Medicine

## 2013-08-03 VITALS — BP 128/80 | HR 92 | Temp 98.0°F | Wt 213.0 lb

## 2013-08-03 DIAGNOSIS — R198 Other specified symptoms and signs involving the digestive system and abdomen: Secondary | ICD-10-CM

## 2013-08-03 DIAGNOSIS — G909 Disorder of the autonomic nervous system, unspecified: Secondary | ICD-10-CM

## 2013-08-03 DIAGNOSIS — Z23 Encounter for immunization: Secondary | ICD-10-CM

## 2013-08-03 DIAGNOSIS — F458 Other somatoform disorders: Secondary | ICD-10-CM

## 2013-08-03 DIAGNOSIS — I10 Essential (primary) hypertension: Secondary | ICD-10-CM

## 2013-08-03 DIAGNOSIS — R0989 Other specified symptoms and signs involving the circulatory and respiratory systems: Secondary | ICD-10-CM

## 2013-08-03 LAB — BASIC METABOLIC PANEL
BUN: 17 mg/dL (ref 6–23)
CO2: 30 mEq/L (ref 19–32)
Calcium: 9.6 mg/dL (ref 8.4–10.5)
Chloride: 98 mEq/L (ref 96–112)
Creatinine, Ser: 0.8 mg/dL (ref 0.4–1.2)
Glucose, Bld: 142 mg/dL — ABNORMAL HIGH (ref 70–99)

## 2013-08-03 MED ORDER — TOPIRAMATE 50 MG PO TABS: 50.0000 mg | ORAL_TABLET | Freq: Every day | ORAL | Status: DC

## 2013-08-03 NOTE — Assessment & Plan Note (Signed)
EGD reassuring May be related to post nasal drip also Will continue the PPI and avoid other testing for now

## 2013-08-03 NOTE — Assessment & Plan Note (Signed)
BP Readings from Last 3 Encounters:  08/03/13 128/80  06/20/13 166/100  06/15/13 148/98   Better now Will check renal

## 2013-08-03 NOTE — Assessment & Plan Note (Signed)
Probably from the RSD topamax spilled and she has been off for a while Will give her Rx to start slowly again Check B12

## 2013-08-03 NOTE — Progress Notes (Signed)
Subjective:    Patient ID: [name], female    DOB: 30-Jul-1966, 47 y.o.   MRN: [id number]  HPI Hasn't  Been checking BP Did have 162/82 at recent EGD Got some biopsies---just acid but no blockages Dr Noe Gens at [facility] is better but persists They recommended ENT and swallow study Continues on bid PPI---now being more careful about taking on empty stomach  Still some headaches but better Some barky cough No chest pain No edema or just her usual mild  Has numbness in feet--like a compression  Pain all the time Has been taking B12 without any help Especially bad at night  Current Outpatient Prescriptions on File Prior to Visit  Medication Sig Dispense Refill  . albuterol (PROAIR HFA) 108 (90 BASE) MCG/ACT inhaler Inhale 2 puffs into the lungs every 4 (four) hours as needed.  1 Inhaler  4  . DULoxetine (CYMBALTA) 30 MG capsule Take 3 capsules (90 mg total) by mouth daily.  270 capsule  0  . esomeprazole (NEXIUM) 40 MG capsule Take 1 capsule (40 mg total) by mouth 2 (two) times daily.  60 capsule  11  . HYDROcodone-acetaminophen (NORCO) 7.5-325 MG per tablet TAKE 1 TABLET BY MOUTH AT BEDTIME AS NEEDED  30 tablet  0  . ibuprofen (ADVIL,MOTRIN) 600 MG tablet TAKE 1 TABLET (600 MG TOTAL) BY MOUTH 3 (THREE) TIMES DAILY.  90 tablet  11  . lidocaine (LIDODERM) 5 % PLACE 1 PATCH ONTO THE SKIN DAILY. REMOVE & DISCARD PATCH WITHIN 12 HOURS OR AS DIRECTED BY MD  30 patch  0  . loratadine (CLARITIN) 10 MG tablet Take 10 mg by mouth daily.        Marland Kitchen losartan-hydrochlorothiazide (HYZAAR) 100-25 MG per tablet Take 1 tablet by mouth daily.  90 tablet  3  . tiZANidine (ZANAFLEX) 4 MG tablet Take 1 tablet (4 mg total) by mouth 4 (four) times daily.  120 tablet  2  . topiramate (TOPAMAX) 200 MG tablet TAKE 1 TABLET (200 MG TOTAL) BY MOUTH DAILY.  30 tablet  2  . traMADol (ULTRAM) 50 MG tablet Take 1 tablet (50 mg total) by mouth at bedtime.  90 tablet  2  . traMADol (ULTRAM-ER) 100 MG 24 hr  tablet Take 1 tablet (100 mg total) by mouth daily.  90 tablet  1   No current facility-administered medications on file prior to visit.    Allergies  Allergen Reactions  . Demerol [Meperidine] Anaphylaxis, Itching and Swelling  . Cymbalta [Duloxetine Hcl]     Generic makes her feel like she is crawling out of her skin  . Diazepam     REACTION: swelling  . Meperidine Hcl     REACTION: swelling    Past Medical History  Diagnosis Date  . Anxiety   . Asthma   . History of colonic polyps   . GERD (gastroesophageal reflux disease)   . Hypertension   . Allergy   . Hyperlipidemia   . ASCVD (arteriosclerotic cardiovascular disease)   . Obesity (BMI 30.0-34.9)   . Sleep apnea     NO MACHINE RECOMMENDED  . TIA (transient ischemic attack) ~ 2009  . Migraines   . Daily headache     "depends on the season" (01/24/2013)  . Arthritis     "just the norm" (01/24/2013)  . Fibromyalgia   . RSD (reflex sympathetic dystrophy)     Past Surgical History  Procedure Laterality Date  . Oophorectomy Right 2009  . Laparoscopic incisional /  umbilical / ventral hernia repair  01/24/2013    IHR w/mesh/notes  . Hernia repair    . Tonsillectomy  1990's  . Nasal septum surgery  1980's?  . Appendectomy  ~ 06/2007  . Bladder repair  ~ 06/2007    "same day after bladder lift" (01/24/2013)  . Bladder suspension  ~ 06/2007  . Vaginal hysterectomy  ` 06/2007  . Diagnostic laparoscopy  1990's & ~ 2000    "I've had a couple; for endometrosis" (01/24/2013)  . Incisional hernia repair N/A 01/24/2013    Procedure: LAPAROSCOPIC INCISIONAL HERNIA;  Surgeon: Shelly Rubenstein, MD;  Location: [facility];  Service: General;  Laterality: N/A;  . Insertion of mesh N/A 01/24/2013    Procedure: INSERTION OF MESH;  Surgeon: Shelly Rubenstein, MD;  Location: [facility];  Service: General;  Laterality: N/A;    Family History  Problem Relation Age of Onset  . Cancer Mother     lung  . Hypertension Mother   . Cancer Father     colon   . Hypertension Sister   . Birth defects Maternal Grandmother     breast  . Birth defects Paternal Grandmother     uterine, stomach, lung    History   Social History  . Marital Status: Married    Spouse Name: N/A    Number of Children: 2  . Years of Education: N/A   Occupational History  . Disabled due to RSD    Social History Main Topics  . Smoking status: Never Smoker   . Smokeless tobacco: Never Used  . Alcohol Use: Yes     Comment: 01/24/2013 "drink or 2 once or /twice/year, if that"  . Drug Use: No  . Sexual Activity: Not Currently   Other Topics Concern  . Not on file   Social History Narrative  . No narrative on file   Review of Systems Still with some jaw pain--needed jaw thrust during the EGD    Objective:   Physical Exam  Constitutional: She appears well-developed and well-nourished. No distress.  Neck: Normal range of motion. Neck supple. No thyromegaly present.  Cardiovascular: Normal rate, regular rhythm, normal heart sounds and intact distal pulses.  Exam reveals no gallop.   No murmur heard. Pulmonary/Chest: Effort normal and breath sounds normal. No respiratory distress. She has no wheezes. She has no rales.  Musculoskeletal: She exhibits no edema and no tenderness.  Lymphadenopathy:    She has no cervical adenopathy.  Psychiatric: She has a normal mood and affect. Her behavior is normal.          Assessment & Plan:

## 2013-08-03 NOTE — Addendum Note (Signed)
Addended by: Despina Hidden on: 08/03/2013 10:35 AM   Modules accepted: Orders

## 2013-08-06 ENCOUNTER — Encounter: Payer: Self-pay | Admitting: *Deleted

## 2013-08-07 ENCOUNTER — Other Ambulatory Visit: Payer: Self-pay | Admitting: Physical Medicine and Rehabilitation

## 2013-08-08 ENCOUNTER — Telehealth: Payer: Self-pay

## 2013-08-08 NOTE — Telephone Encounter (Signed)
Patient needs a refill on her Nexium and her [employer] is requesting a updated medical report. She would like for someone to call and inform her if we received a request for her medical report update to be sent to her insurance company yet.

## 2013-08-09 NOTE — Telephone Encounter (Signed)
Spoke with patient and she is having trouble getting nexium filled.  She says she did see an ENT, but we are still prescribing the medication.  Left message with patients case manager to see why this medication was no longer being covered.  Requested that they approve the medication and call the pharmacy.

## 2013-08-10 ENCOUNTER — Telehealth: Payer: Self-pay | Admitting: Physical Medicine & Rehabilitation

## 2013-08-10 ENCOUNTER — Encounter: Payer: Self-pay | Admitting: Physical Medicine & Rehabilitation

## 2013-08-10 ENCOUNTER — Ambulatory Visit (HOSPITAL_BASED_OUTPATIENT_CLINIC_OR_DEPARTMENT_OTHER): Payer: Worker's Compensation | Admitting: Physical Medicine & Rehabilitation

## 2013-08-10 ENCOUNTER — Encounter: Payer: Worker's Compensation | Attending: Physical Medicine and Rehabilitation

## 2013-08-10 ENCOUNTER — Encounter (INDEPENDENT_AMBULATORY_CARE_PROVIDER_SITE_OTHER): Payer: Self-pay

## 2013-08-10 VITALS — BP 150/89 | HR 112 | Resp 14 | Ht 68.0 in | Wt 212.2 lb

## 2013-08-10 DIAGNOSIS — G90519 Complex regional pain syndrome I of unspecified upper limb: Secondary | ICD-10-CM | POA: Insufficient documentation

## 2013-08-10 DIAGNOSIS — G9050 Complex regional pain syndrome I, unspecified: Secondary | ICD-10-CM

## 2013-08-10 DIAGNOSIS — M79609 Pain in unspecified limb: Secondary | ICD-10-CM | POA: Insufficient documentation

## 2013-08-10 DIAGNOSIS — R6889 Other general symptoms and signs: Secondary | ICD-10-CM | POA: Insufficient documentation

## 2013-08-10 MED ORDER — HYDROCODONE-ACETAMINOPHEN 7.5-325 MG PO TABS: ORAL_TABLET | ORAL | Status: DC

## 2013-08-10 MED ORDER — ACETAMINOPHEN-CODEINE #4 300-60 MG PO TABS: 1.0000 | ORAL_TABLET | ORAL | Status: DC | PRN

## 2013-08-10 MED ORDER — TRAMADOL HCL 50 MG PO TABS: 50.0000 mg | ORAL_TABLET | Freq: Every day | ORAL | Status: DC

## 2013-08-10 MED ORDER — TIZANIDINE HCL 4 MG PO TABS: 4.0000 mg | ORAL_TABLET | Freq: Four times a day (QID) | ORAL | Status: DC

## 2013-08-10 NOTE — Progress Notes (Deleted)
  Subjective:    Patient ID: [name], female    DOB: 1966/10/11, 47 y.o.   MRN: [id number]  HPI    Review of Systems     Objective:   Physical Exam        Assessment & Plan:

## 2013-08-10 NOTE — Progress Notes (Signed)
Subjective:    Patient ID: [name], female    DOB: [birth year], 47 y.o.   MRN: [id number]  HPI Chief complaint is left wrist pain, chronic, no new trauma. Soft primary care physician for bilateral foot pain. His impression was that this was related to reflex sympathetic dystrophy. She states she gets occasional foot swelling. Came off her Topamax medication by mistake. Continuing on her other medications which include short acting and long acting tramadol, hydrocodone 7.5 each bedtime, Cymbalta 30 mg a day, Lidoderm patch on 12 off 12, Zanaflex 4 mg 4 times a day and Motrin 600 mg 3 times a day  We discussed DEA change of hydrocodone from schedule 3 to schedule 2 We discussed alternative treatments including Tylenol #4. She is willing to try this month. Pain Inventory Average Pain 8 Pain Right Now 9 My pain is constant, sharp, burning, stabbing, tingling and aching  In the last 24 hours, has pain interfered with the following? General activity 0 Relation with others 0 Enjoyment of life 0 What TIME of day is your pain at its worst? all Sleep (in general) Poor  Pain is worse with: walking, bending, sitting and standing Pain improves with: rest, heat/ice, therapy/exercise, pacing activities, medication and TENS Relief from Meds: 5  Mobility ability to climb steps?  yes do you drive?  yes  Function disabled: date disabled . I need assistance with the following:  meal prep, household duties and shopping  Neuro/Psych bladder control problems weakness numbness tremor tingling trouble walking spasms depression anxiety  Prior Studies Any changes since last visit?  no  Physicians involved in your care Any changes since last visit?  no   Family History  Problem Relation Age of Onset  . Cancer Mother     lung  . Hypertension Mother   . Cancer Father     colon  . Hypertension Sister   . Birth defects Maternal Grandmother     breast  . Birth defects Paternal  Grandmother     uterine, stomach, lung   History   Social History  . Marital Status: Married    Spouse Name: N/A    Number of Children: 2  . Years of Education: N/A   Occupational History  . Disabled due to RSD    Social History Main Topics  . Smoking status: Never Smoker   . Smokeless tobacco: Never Used  . Alcohol Use: Yes     Comment: 01/24/2013 "drink or 2 once or /twice/year, if that"  . Drug Use: No  . Sexual Activity: Not Currently   Other Topics Concern  . None   Social History Narrative  . None   Past Surgical History  Procedure Laterality Date  . Oophorectomy Right 2009  . Laparoscopic incisional / umbilical / ventral hernia repair  01/24/2013    IHR w/mesh/notes  . Hernia repair    . Tonsillectomy  1990's  . Nasal septum surgery  1980's?  . Appendectomy  ~ 06/2007  . Bladder repair  ~ 06/2007    "same day after bladder lift" (01/24/2013)  . Bladder suspension  ~ 06/2007  . Vaginal hysterectomy  ` 06/2007  . Diagnostic laparoscopy  1990's & ~ 2000    "I've had a couple; for endometrosis" (01/24/2013)  . Incisional hernia repair N/A 01/24/2013    Procedure: LAPAROSCOPIC INCISIONAL HERNIA;  Surgeon: Harl Bowie, MD;  Location: [facility];  Service: General;  Laterality: N/A;  . Insertion of mesh N/A 01/24/2013  Procedure: INSERTION OF MESH;  Surgeon: Harl Bowie, MD;  Location: [facility];  Service: General;  Laterality: N/A;   Past Medical History  Diagnosis Date  . Anxiety   . Asthma   . History of colonic polyps   . GERD (gastroesophageal reflux disease)   . Hypertension   . Allergy   . Hyperlipidemia   . ASCVD (arteriosclerotic cardiovascular disease)   . Obesity (BMI 30.0-34.9)   . Sleep apnea     NO MACHINE RECOMMENDED  . TIA (transient ischemic attack) ~ 2009  . Migraines   . Daily headache     "depends on the season" (01/24/2013)  . Arthritis     "just the norm" (01/24/2013)  . Fibromyalgia   . RSD (reflex sympathetic dystrophy)    BP 150/89   Pulse 112  Resp 14  Ht 5' 8"  (1.727 m)  Wt 212 lb 3.2 oz (96.253 kg)  BMI 32.27 kg/m2  SpO2 97%   Review of Systems  Constitutional: Positive for diaphoresis.  Cardiovascular: Positive for leg swelling.  Gastrointestinal: Positive for constipation.  Genitourinary:       Bladder control problems  Neurological: Positive for tremors, weakness and numbness.       Tingling  Psychiatric/Behavioral: Positive for dysphoric mood. The patient is nervous/anxious.   All other systems reviewed and are negative.       Objective:   Physical Exam  Left upper extremity 3 minus/5 in the deltoid, bicep, tricep, grip give way type weakness. Pain sensitivity limits testing. Mild atrophy in the left upper extremity. Mild tremor. No evidence of swelling no  skin or nail changes  Bilateral feet hypersensitive to touch. Pulses are good. No skin or nail bed changes Good range of motion. Ambulates without evidence of pain      Assessment & Plan:  1. Reflex sympathetic dystrophy left wrist and upper extremity. Doing well on her current medications however had problem with Topamax prescription and came off of this completely which resulted in an exacerbation of pain. Her primary physician prescribed Topamax 50 mg and she is going up by 1 tablet per week until she gets up to 200 mg. At that time but we will then add her 100 mg morning dose and then she will take 200 mg at night.

## 2013-08-10 NOTE — Patient Instructions (Addendum)
Hydrocodone is now a schedule II medication. This means we cannot refill over the phone and it means that we need to see you on a monthly basis.  Other alternatives include Tylenol 4 with codeine. This would allow you to be seen every 3 months. We may have to prescribe up to 2 tablets to get a similar efficacy. try Tylenol 4 at night in place of hydrocodone. Report results to Santiago Glad when you see her

## 2013-08-10 NOTE — Telephone Encounter (Signed)
Tramadol called into [facility].  Advised that it it workers comp but if it does not go through patient may have to pay out of pocket until she gets in contact with case manager.  We have left a message with [employer] to call us back regarding the case as well.

## 2013-08-10 NOTE — Telephone Encounter (Signed)
Patient at pharmacy trying to get her Tramadol filled and the pharmacy is telling her she needs a written prescription.

## 2013-08-13 ENCOUNTER — Other Ambulatory Visit: Payer: Self-pay

## 2013-08-13 MED ORDER — TRAMADOL HCL ER 100 MG PO TB24: 100.0000 mg | ORAL_TABLET | Freq: Every day | ORAL | Status: DC

## 2013-08-30 ENCOUNTER — Other Ambulatory Visit: Payer: Self-pay | Admitting: Internal Medicine

## 2013-09-04 ENCOUNTER — Encounter: Payer: Self-pay | Admitting: Physical Medicine and Rehabilitation

## 2013-09-04 ENCOUNTER — Encounter
Payer: Worker's Compensation | Attending: Physical Medicine and Rehabilitation | Admitting: Physical Medicine and Rehabilitation

## 2013-09-04 VITALS — BP 119/74 | HR 95 | Resp 14 | Ht 68.0 in | Wt 210.0 lb

## 2013-09-04 DIAGNOSIS — Z79899 Other long term (current) drug therapy: Secondary | ICD-10-CM | POA: Insufficient documentation

## 2013-09-04 DIAGNOSIS — G9050 Complex regional pain syndrome I, unspecified: Secondary | ICD-10-CM

## 2013-09-04 DIAGNOSIS — G90519 Complex regional pain syndrome I of unspecified upper limb: Secondary | ICD-10-CM | POA: Insufficient documentation

## 2013-09-04 DIAGNOSIS — M79609 Pain in unspecified limb: Secondary | ICD-10-CM | POA: Insufficient documentation

## 2013-09-04 DIAGNOSIS — Z5181 Encounter for therapeutic drug level monitoring: Secondary | ICD-10-CM

## 2013-09-04 DIAGNOSIS — R6889 Other general symptoms and signs: Secondary | ICD-10-CM | POA: Insufficient documentation

## 2013-09-04 MED ORDER — HYDROCODONE-ACETAMINOPHEN 7.5-325 MG PO TABS: ORAL_TABLET | ORAL | Status: DC

## 2013-09-04 NOTE — Progress Notes (Signed)
Subjective:    Patient ID: [name], female    DOB: 1966/01/26, 47 y.o.   MRN: [id number]  HPI The patient complains about chronic left arm pain, after an accident at her workplace Dec. 1994. The patient also complains about numbness and tingling in her left arm.  The problem is controlled by medications most of the time. She also states, that she is doing her aquatic exercises again, which really helps to alleviate her symptoms.  The patient reports, that she had an allergic reaction to the generic Cymbalta, mainly itching. She is asking me, whether I can prescribe the brand name medication,which she tolerates well, which I did when I last saw her.  The patient reports that the tylenol/ codeine did not give her any relief, she would like to stay with the Hydrocodone 7.5 hs. The patient reports, that she had a surgical hernia repair on April the 9th, by Dr. Magnus Ivan, which has healed well.  Pain Inventory Average Pain 7 Pain Right Now 9 My pain is constant, sharp, burning, dull, stabbing, tingling and aching  In the last 24 hours, has pain interfered with the following? General activity 0 Relation with others 0 Enjoyment of life 0 What TIME of day is your pain at its worst? constant Sleep (in general) Poor  Pain is worse with: walking, bending, sitting, inactivity and standing Pain improves with: rest, heat/ice, pacing activities, medication and TENS Relief from Meds: 6  Mobility ability to climb steps?  yes do you drive?  yes  Function disabled: date disabled 46 I need assistance with the following:  meal prep, household duties and shopping  Neuro/Psych weakness numbness tingling spasms dizziness depression anxiety  Prior Studies Any changes since last visit?  no  Physicians involved in your care Any changes since last visit?  no   Family History  Problem Relation Age of Onset  . Cancer Mother     lung  . Hypertension Mother   . Cancer Father     colon   . Hypertension Sister   . Birth defects Maternal Grandmother     breast  . Birth defects Paternal Grandmother     uterine, stomach, lung   History   Social History  . Marital Status: Married    Spouse Name: N/A    Number of Children: 2  . Years of Education: N/A   Occupational History  . Disabled due to RSD    Social History Main Topics  . Smoking status: Never Smoker   . Smokeless tobacco: Never Used  . Alcohol Use: Yes     Comment: 01/24/2013 "drink or 2 once or /twice/year, if that"  . Drug Use: No  . Sexual Activity: Not Currently   Other Topics Concern  . None   Social History Narrative  . None   Past Surgical History  Procedure Laterality Date  . Oophorectomy Right 2009  . Laparoscopic incisional / umbilical / ventral hernia repair  01/24/2013    IHR w/mesh/notes  . Hernia repair    . Tonsillectomy  1990's  . Nasal septum surgery  1980's?  . Appendectomy  ~ 06/2007  . Bladder repair  ~ 06/2007    "same day after bladder lift" (01/24/2013)  . Bladder suspension  ~ 06/2007  . Vaginal hysterectomy  ` 06/2007  . Diagnostic laparoscopy  1990's & ~ 2000    "I've had a couple; for endometrosis" (01/24/2013)  . Incisional hernia repair N/A 01/24/2013    Procedure: LAPAROSCOPIC INCISIONAL HERNIA;  Surgeon: Shelly Rubenstein, MD;  Location: [facility] OR;  Service: General;  Laterality: N/A;  . Insertion of mesh N/A 01/24/2013    Procedure: INSERTION OF MESH;  Surgeon: Shelly Rubenstein, MD;  Location: [facility] OR;  Service: General;  Laterality: N/A;   Past Medical History  Diagnosis Date  . Anxiety   . Asthma   . History of colonic polyps   . GERD (gastroesophageal reflux disease)   . Hypertension   . Allergy   . Hyperlipidemia   . ASCVD (arteriosclerotic cardiovascular disease)   . Obesity (BMI 30.0-34.9)   . Sleep apnea     NO MACHINE RECOMMENDED  . TIA (transient ischemic attack) ~ 2009  . Migraines   . Daily headache     "depends on the season" (01/24/2013)  . Arthritis      "just the norm" (01/24/2013)  . Fibromyalgia   . RSD (reflex sympathetic dystrophy)    BP 119/74  Pulse 95  Resp 14  Ht 5\' 8"  (1.727 m)  Wt 210 lb (95.255 kg)  BMI 31.94 kg/m2  SpO2 95%     Review of Systems  Constitutional: Positive for diaphoresis and unexpected weight change.  Cardiovascular: Positive for leg swelling.  Gastrointestinal: Positive for nausea and constipation.  Neurological: Positive for dizziness, weakness and numbness.       Spasm, tingling  Psychiatric/Behavioral: Positive for dysphoric mood. The patient is nervous/anxious.   All other systems reviewed and are negative.       Objective:   Physical Exam Constitutional: She is oriented to person, place, and time. She appears well-developed and well-nourished.  HENT:  Head: Normocephalic.  Neck: Neck supple.  Musculoskeletal: She exhibits extreme tenderness, even to minimal touch on left UE.  Neurological: She is alert and oriented to person, place, and time.  Skin: Skin is warm and dry. Incisions from abdominal hernia repair well healed. Psychiatric: She has a normal mood and affect.  Symmetric normal motor tone is noted throughout. Normal muscle bulk. Muscle testing reveals 5/5 muscle strength of the upper extremity, except left UE patient is very hesitant to move against any resistance because of fear of pain, could not test completely, and 5/5 of the lower extremity. Full range of motion in upper and lower extremities, except left wrist and fingers, restrited motion mainly because of severe pain. ROM of spine is mildly restricted.  DTR in the upper and lower extremity are present and symmetric 2+. No clonus is noted.  Patient arises from chair without difficulty. Narrow based gait with normal arm swing bilateral , able to walk on heels and toes . Tandem walk is stable. No pronator drift. Rhomberg negative.        Assessment & Plan:  1. Reflex sympathetic dystrophy left upper extremity. A work related  injury in the 1990s. Continue current medication management.  2. Lower remedy pain in the left foot.  3. She had an allergic reaction to the generic Cymbalta, mainly itching. She is asking me, whether I can prescribe the brand name medication,which she tolerates well.Prescribed Cymbalta brand only at the last visit, advised patient that she should take the generic back to her pharmacy, when she is picking up the brand name medication, which she did.  4. Throat spasms, sometimes triggered by food or GERD, sounds like Achalasia, advised patient to make an appointment with ENT, and chew her food well, and do not swallow big pieces of food.Patient reports she had been seen by ENT, they did a  biopsy, results pending, and she is scheduled for a Barium swallow test PLAN  Patient is managing her symptoms with exercising in the pool and on land and with medication. She should continue. Refilled her Hydrocodone 7.5/325, hs, # 30  Follow up in 1 month.

## 2013-09-04 NOTE — Patient Instructions (Signed)
Continue with your aquatic exercises, as tolerated

## 2013-09-05 ENCOUNTER — Encounter: Payer: Worker's Compensation | Admitting: Physical Medicine and Rehabilitation

## 2013-09-11 ENCOUNTER — Telehealth: Payer: Self-pay

## 2013-09-11 NOTE — Telephone Encounter (Signed)
Pt left v/m wanting to verify Dr Alphonsus Sias received pathology reports from pts colonoscopy. Left v.m for pt to cb.

## 2013-09-11 NOTE — Telephone Encounter (Signed)
Pt said Dr Noe Gens at [facility] in [place], [place]. Did colonoscopy 2-3 weeks. Pt will contact Dr Noe Gens office and request faxed to [phone]. Pt said OK to wait on Dr Karle Starch return; would like Dr Karle Starch comments.

## 2013-09-13 NOTE — Telephone Encounter (Signed)
I don't see any report in the chart Will need to wait for it to come to review with her

## 2013-09-17 NOTE — Telephone Encounter (Signed)
Pt will call to get report sent over again.

## 2013-09-27 ENCOUNTER — Encounter: Payer: Self-pay | Admitting: Internal Medicine

## 2013-10-01 ENCOUNTER — Other Ambulatory Visit: Payer: Self-pay | Admitting: *Deleted

## 2013-10-01 ENCOUNTER — Other Ambulatory Visit: Payer: Self-pay | Admitting: Physical Medicine & Rehabilitation

## 2013-10-01 MED ORDER — HYDROCODONE-ACETAMINOPHEN 7.5-325 MG PO TABS: ORAL_TABLET | ORAL | Status: DC

## 2013-10-01 NOTE — Telephone Encounter (Signed)
RX printed early for controlled medication for the visit with RN on 10/05/13 (to be signed by MD)

## 2013-10-01 NOTE — Telephone Encounter (Signed)
RX printed early for controlled medication for the visit with RN on 10/05/13 (to be signed by MD) 

## 2013-10-02 ENCOUNTER — Other Ambulatory Visit: Payer: Self-pay | Admitting: Internal Medicine

## 2013-10-05 ENCOUNTER — Encounter: Payer: Worker's Compensation | Attending: Physical Medicine & Rehabilitation | Admitting: *Deleted

## 2013-10-05 ENCOUNTER — Encounter: Payer: Self-pay | Admitting: *Deleted

## 2013-10-05 VITALS — BP 132/84 | HR 95 | Resp 14

## 2013-10-05 DIAGNOSIS — M79609 Pain in unspecified limb: Secondary | ICD-10-CM | POA: Diagnosis not present

## 2013-10-05 DIAGNOSIS — R200 Anesthesia of skin: Secondary | ICD-10-CM

## 2013-10-05 DIAGNOSIS — G90519 Complex regional pain syndrome I of unspecified upper limb: Secondary | ICD-10-CM | POA: Insufficient documentation

## 2013-10-05 DIAGNOSIS — G909 Disorder of the autonomic nervous system, unspecified: Secondary | ICD-10-CM

## 2013-10-05 DIAGNOSIS — G9050 Complex regional pain syndrome I, unspecified: Secondary | ICD-10-CM

## 2013-10-05 NOTE — Patient Instructions (Addendum)
Follow up one month with MD to reassess pain and med increase

## 2013-10-05 NOTE — Progress Notes (Signed)
Here for pill count and medication refills. Pill count appropriate.  [name] is  Having more pain in her feet and she has talked with her adjuster and they suggest she see Dr Wynn Banker.  Sheis requesting an increase on her hydrocodone to where she can take an extra pill once in a while when the pain in there feet is worse.  We will schedule her next month with Dr Wynn Banker.  Refill given on hydrocodone.

## 2013-10-16 ENCOUNTER — Encounter: Payer: Self-pay | Admitting: Family Medicine

## 2013-10-16 ENCOUNTER — Ambulatory Visit (INDEPENDENT_AMBULATORY_CARE_PROVIDER_SITE_OTHER): Payer: BC Managed Care – PPO | Admitting: Family Medicine

## 2013-10-16 VITALS — BP 112/72 | HR 88 | Temp 98.0°F | Wt 206.5 lb

## 2013-10-16 DIAGNOSIS — J019 Acute sinusitis, unspecified: Secondary | ICD-10-CM

## 2013-10-16 MED ORDER — AMOXICILLIN-POT CLAVULANATE 875-125 MG PO TABS: 1.0000 | ORAL_TABLET | Freq: Two times a day (BID) | ORAL | Status: DC

## 2013-10-16 MED ORDER — BENZONATATE 200 MG PO CAPS: 200.0000 mg | ORAL_CAPSULE | Freq: Three times a day (TID) | ORAL | Status: DC | PRN

## 2013-10-16 MED ORDER — ALBUTEROL SULFATE HFA 108 (90 BASE) MCG/ACT IN AERS: 2.0000 | INHALATION_SPRAY | RESPIRATORY_TRACT | Status: DC | PRN

## 2013-10-16 NOTE — Progress Notes (Signed)
Pre-visit discussion using our clinic review tool. No additional management support is needed unless otherwise documented below in the visit note.  Son was spraying axe body spray on xmas.  She has a h/o allergies to colognes prev.  Started with SOB/wheeze at the time of the exposure.  Took some benadryl, used inhaler, nebulizer and got outside.  Since then facial pain, fatigue, cough, sputum.  Rhinorrhea.  No fevers known but had sweats.  Last used inhaler last night.    Meds, vitals, and allergies reviewed.   ROS: See HPI.  Otherwise, noncontributory.  GEN: nad, alert and oriented HEENT: mucous membranes moist, tm w/o erythema, nasal exam w/o erythema, clear discharge noted,  OP with cobblestoning, sinuses ttp x4 NECK: supple w/o LA CV: rrr.   PULM: ctab, no inc wob EXT: no edema SKIN: no acute rash

## 2013-10-16 NOTE — Patient Instructions (Addendum)
Use the inhaler as needed.  Keep taking benadryl at night.  Use the tessalon if the inhaler doesn't help the cough.  Start the antibiotics today.  Try to get some rest.  Take care.

## 2013-10-16 NOTE — Assessment & Plan Note (Signed)
Allergy/exposure may be incidental.  Given the tenderness on exam would treat with augmentin.  Use SABA prn with tessalon prn cough.  She agrees.   Nontoxic.  No wheeze on exam, no inc in wob.   F/u prn.

## 2013-10-25 ENCOUNTER — Other Ambulatory Visit: Payer: Self-pay | Admitting: Obstetrics & Gynecology

## 2013-10-25 DIAGNOSIS — N6320 Unspecified lump in the left breast, unspecified quadrant: Secondary | ICD-10-CM

## 2013-11-01 ENCOUNTER — Ambulatory Visit (INDEPENDENT_AMBULATORY_CARE_PROVIDER_SITE_OTHER)
Admission: RE | Admit: 2013-11-01 | Discharge: 2013-11-01 | Disposition: A | Discharge status: Home / Self Care | Payer: BC Managed Care – PPO | Source: Ambulatory Visit | Attending: Internal Medicine | Admitting: Internal Medicine

## 2013-11-01 ENCOUNTER — Encounter: Payer: Self-pay | Admitting: Internal Medicine

## 2013-11-01 ENCOUNTER — Ambulatory Visit (INDEPENDENT_AMBULATORY_CARE_PROVIDER_SITE_OTHER): Payer: BC Managed Care – PPO | Admitting: Internal Medicine

## 2013-11-01 VITALS — BP 120/80 | HR 96 | Temp 98.4°F | Wt 204.0 lb

## 2013-11-01 DIAGNOSIS — R05 Cough: Secondary | ICD-10-CM

## 2013-11-01 DIAGNOSIS — R053 Chronic cough: Secondary | ICD-10-CM

## 2013-11-01 DIAGNOSIS — R059 Cough, unspecified: Secondary | ICD-10-CM

## 2013-11-01 MED ORDER — PREDNISONE 20 MG PO TABS: 40.0000 mg | ORAL_TABLET | Freq: Every day | ORAL | Status: DC

## 2013-11-01 MED ORDER — HYDROCOD POLST-CHLORPHEN POLST 10-8 MG/5ML PO LQCR: 5.0000 mL | Freq: Every evening | ORAL | Status: DC | PRN

## 2013-11-01 NOTE — Progress Notes (Signed)
Subjective:    Patient ID: [name], female    DOB: [birth year], 48 y.o.   MRN: [id number]  HPI Still coughing Sick since before last visit Took antibiotic--only gave her diarrhea It feels the same as before Son has croup  Has been on her regular tramadol--this doesn't help No fever No sweats or shakes. Some chills "Very" SOB-- even not with a cough Feels like she is wheezing but thinks it may be in throat Tried inhaler and nebs---not really that helpful  Current Outpatient Prescriptions on File Prior to Visit  Medication Sig Dispense Refill  . albuterol (PROAIR HFA) 108 (90 BASE) MCG/ACT inhaler Inhale 2 puffs into the lungs every 4 (four) hours as needed.  1 Inhaler  4  . amoxicillin-clavulanate (AUGMENTIN) 875-125 MG per tablet Take 1 tablet by mouth 2 (two) times daily.  20 tablet  0  . benzonatate (TESSALON) 200 MG capsule Take 1 capsule (200 mg total) by mouth 3 (three) times daily as needed for cough.  30 capsule  1  . CYMBALTA 30 MG capsule TAKE 3 CAPSULES BY MOUTH EVERY DAY  270 capsule  0  . esomeprazole (NEXIUM) 40 MG capsule Take 1 capsule (40 mg total) by mouth 2 (two) times daily.  60 capsule  11  . HYDROcodone-acetaminophen (NORCO) 7.5-325 MG per tablet TAKE 1 TABLET BY MOUTH AT BEDTIME AS NEEDED  30 tablet  0  . ibuprofen (ADVIL,MOTRIN) 600 MG tablet TAKE 1 TABLET (600 MG TOTAL) BY MOUTH 3 (THREE) TIMES DAILY.  90 tablet  11  . lidocaine (LIDODERM) 5 % PLACE 1 PATCH ONTO THE SKIN DAILY. REMOVE & DISCARD PATCH WITHIN 12 HOURS OR AS DIRECTED BY MD  30 patch  0  . loratadine (CLARITIN) 10 MG tablet Take 10 mg by mouth daily.        Marland Kitchen losartan-hydrochlorothiazide (HYZAAR) 100-25 MG per tablet Take 1 tablet by mouth daily.  90 tablet  3  . PEG 3350-KCl-NaBcb-NaCl-NaSulf (PEG 3350/ELECTROLYTES) 240 G SOLR Take 1 Container by mouth once. Colonoscopy prep      . tiZANidine (ZANAFLEX) 4 MG tablet Take 1 tablet (4 mg total) by mouth 4 (four) times daily.  120 tablet  2    . topiramate (TOPAMAX) 200 MG tablet TAKE 1 TABLET (200 MG TOTAL) BY MOUTH DAILY.  30 tablet  2  . traMADol (ULTRAM) 50 MG tablet Take 1 tablet (50 mg total) by mouth at bedtime.  90 tablet  2  . traMADol (ULTRAM-ER) 100 MG 24 hr tablet Take 1 tablet (100 mg total) by mouth daily.  90 tablet  2   No current facility-administered medications on file prior to visit.    Allergies  Allergen Reactions  . Demerol [Meperidine] Anaphylaxis, Itching and Swelling  . Cymbalta [Duloxetine Hcl]     Generic makes her feel like she is crawling out of her skin  . Diazepam     REACTION: swelling  . Meperidine Hcl     REACTION: swelling    Past Medical History  Diagnosis Date  . Anxiety   . Asthma   . History of colonic polyps   . GERD (gastroesophageal reflux disease)   . Hypertension   . Allergy   . Hyperlipidemia   . ASCVD (arteriosclerotic cardiovascular disease)   . Obesity (BMI 30.0-34.9)   . Sleep apnea     NO MACHINE RECOMMENDED  . TIA (transient ischemic attack) ~ 2009  . Migraines   . Daily headache     "  depends on the season" (01/24/2013)  . Arthritis     "just the norm" (01/24/2013)  . Fibromyalgia   . RSD (reflex sympathetic dystrophy)     Past Surgical History  Procedure Laterality Date  . Oophorectomy Right 2009  . Laparoscopic incisional / umbilical / ventral hernia repair  01/24/2013    IHR w/mesh/notes  . Hernia repair    . Tonsillectomy  1990's  . Nasal septum surgery  1980's?  . Appendectomy  ~ 06/2007  . Bladder repair  ~ 06/2007    "same day after bladder lift" (01/24/2013)  . Bladder suspension  ~ 06/2007  . Vaginal hysterectomy  ` 06/2007  . Diagnostic laparoscopy  1990's & ~ 2000    "I've had a couple; for endometrosis" (01/24/2013)  . Incisional hernia repair N/A 01/24/2013    Procedure: LAPAROSCOPIC INCISIONAL HERNIA;  Surgeon: Harl Bowie, MD;  Location: [facility];  Service: General;  Laterality: N/A;  . Insertion of mesh N/A 01/24/2013    Procedure: INSERTION  OF MESH;  Surgeon: Harl Bowie, MD;  Location: [facility] OR;  Service: General;  Laterality: N/A;    Family History  Problem Relation Age of Onset  . Cancer Mother     lung  . Hypertension Mother   . Cancer Father     colon  . Hypertension Sister   . Birth defects Maternal Grandmother     breast  . Birth defects Paternal Grandmother     uterine, stomach, lung    History   Social History  . Marital Status: Married    Spouse Name: N/A    Number of Children: 2  . Years of Education: N/A   Occupational History  . Disabled due to RSD    Social History Main Topics  . Smoking status: Never Smoker   . Smokeless tobacco: Never Used  . Alcohol Use: Yes     Comment: 01/24/2013 "drink or 2 once or /twice/year, if that"  . Drug Use: No  . Sexual Activity: Not Currently   Other Topics Concern  . Not on file   Social History Narrative  . No narrative on file   Review of Systems Ongoing bad foot pain--current pain meds don't resolve the pain Does have appt with Dr Letta Pate later this month      Objective:   Physical Exam  Constitutional: She appears well-developed and well-nourished. No distress.  HENT:  Mouth/Throat: Oropharynx is clear and moist. No oropharyngeal exudate.  Mild nasal congestion TMs negative   Neck: Normal range of motion. Neck supple.  Pulmonary/Chest: Effort normal and breath sounds normal. No respiratory distress. She has no wheezes. She has no rales.  No dullness  Lymphadenopathy:    She has no cervical adenopathy.          Assessment & Plan:

## 2013-11-01 NOTE — Assessment & Plan Note (Addendum)
Seems viral still--croup like syndrome CXR looks okay---will try levaquin if the radiologist is concerned about the LLL   No Rx for narcotics--is followed at pain center Will try prednisone to reduce the inflammation Discussed other Rx ---honey, Vick's, etc  Only thing that helped was tussionex Had some left over Will give small amount and send copy to Dr Wynn BankerKirsteins

## 2013-11-01 NOTE — Progress Notes (Signed)
Pre-visit discussion using our clinic review tool. No additional management support is needed unless otherwise documented below in the visit note.  

## 2013-11-02 ENCOUNTER — Encounter: Payer: Self-pay | Admitting: *Deleted

## 2013-11-08 ENCOUNTER — Other Ambulatory Visit: Payer: Self-pay | Admitting: Obstetrics & Gynecology

## 2013-11-08 ENCOUNTER — Ambulatory Visit
Admission: RE | Admit: 2013-11-08 | Discharge: 2013-11-08 | Disposition: A | Discharge status: Home / Self Care | Payer: Worker's Compensation | Source: Ambulatory Visit | Attending: Obstetrics & Gynecology | Admitting: Obstetrics & Gynecology

## 2013-11-08 DIAGNOSIS — N6320 Unspecified lump in the left breast, unspecified quadrant: Secondary | ICD-10-CM

## 2013-11-12 ENCOUNTER — Other Ambulatory Visit: Payer: Self-pay | Admitting: Physical Medicine & Rehabilitation

## 2013-11-12 ENCOUNTER — Ambulatory Visit (HOSPITAL_BASED_OUTPATIENT_CLINIC_OR_DEPARTMENT_OTHER): Payer: Worker's Compensation | Admitting: Physical Medicine & Rehabilitation

## 2013-11-12 ENCOUNTER — Encounter: Payer: Self-pay | Admitting: Physical Medicine & Rehabilitation

## 2013-11-12 ENCOUNTER — Telehealth: Payer: Self-pay

## 2013-11-12 ENCOUNTER — Encounter: Payer: Worker's Compensation | Attending: Physical Medicine and Rehabilitation

## 2013-11-12 VITALS — BP 127/77 | HR 109 | Resp 14 | Ht 68.0 in | Wt 204.0 lb

## 2013-11-12 DIAGNOSIS — Z79899 Other long term (current) drug therapy: Secondary | ICD-10-CM | POA: Diagnosis not present

## 2013-11-12 DIAGNOSIS — R6889 Other general symptoms and signs: Secondary | ICD-10-CM | POA: Insufficient documentation

## 2013-11-12 DIAGNOSIS — G90519 Complex regional pain syndrome I of unspecified upper limb: Secondary | ICD-10-CM | POA: Diagnosis not present

## 2013-11-12 DIAGNOSIS — M79609 Pain in unspecified limb: Secondary | ICD-10-CM | POA: Insufficient documentation

## 2013-11-12 DIAGNOSIS — R209 Unspecified disturbances of skin sensation: Secondary | ICD-10-CM

## 2013-11-12 DIAGNOSIS — G9050 Complex regional pain syndrome I, unspecified: Secondary | ICD-10-CM

## 2013-11-12 MED ORDER — HYDROCODONE-ACETAMINOPHEN 7.5-325 MG PO TABS: ORAL_TABLET | ORAL | Status: DC

## 2013-11-12 MED ORDER — TRAMADOL HCL 50 MG PO TABS: 50.0000 mg | ORAL_TABLET | Freq: Two times a day (BID) | ORAL | Status: DC

## 2013-11-12 NOTE — Telephone Encounter (Signed)
Called in Tramadol to pharmacy

## 2013-11-12 NOTE — Progress Notes (Signed)
Subjective:    Patient ID: [name], female    DOB: May 26, 1966, 48 y.o.   MRN: [id number] CC: RUE pain chronic HPI ORT 3 low risk for misuse  Cough persists Complaining of bilateral burning pain in the feet. Also complaining of swelling and discoloration Complaints of hypersensitivity in the feet. States that this has been going on for number of years. Spoke to her caseworker for workers compensation about this.  No workup of this in the past. No EMG is, no vascular studies. Difficulty wearing socks Pain Inventory Average Pain 8 Pain Right Now 9 My pain is constant, sharp, burning, stabbing, tingling and aching  In the last 24 hours, has pain interfered with the following? General activity 7 Relation with others 7 Enjoyment of life 7 What TIME of day is your pain at its worst? constant Sleep (in general) Poor  Pain is worse with: walking, bending, sitting, inactivity and standing Pain improves with: rest, heat/ice, therapy/exercise, pacing activities, medication and TENS Relief from Meds: 6  Mobility ability to climb steps?  yes do you drive?  yes  Function disabled: date disabled 32 I need assistance with the following:  household duties and shopping  Neuro/Psych bladder control problems bowel control problems weakness numbness tremor tingling spasms dizziness depression anxiety  Prior Studies Any changes since last visit?  no  Physicians involved in your care Any changes since last visit?  no   Family History  Problem Relation Age of Onset  . Cancer Mother     lung  . Hypertension Mother   . Cancer Father     colon  . Hypertension Sister   . Birth defects Maternal Grandmother     breast  . Birth defects Paternal Grandmother     uterine, stomach, lung   History   Social History  . Marital Status: Married    Spouse Name: N/A    Number of Children: 2  . Years of Education: N/A   Occupational History  . Disabled due to RSD     Social History Main Topics  . Smoking status: Never Smoker   . Smokeless tobacco: Never Used  . Alcohol Use: Yes     Comment: 01/24/2013 "drink or 2 once or /twice/year, if that"  . Drug Use: No  . Sexual Activity: Not Currently   Other Topics Concern  . None   Social History Narrative  . None   Past Surgical History  Procedure Laterality Date  . Oophorectomy Right 2009  . Laparoscopic incisional / umbilical / ventral hernia repair  01/24/2013    IHR w/mesh/notes  . Hernia repair    . Tonsillectomy  1990's  . Nasal septum surgery  1980's?  . Appendectomy  ~ 06/2007  . Bladder repair  ~ 06/2007    "same day after bladder lift" (01/24/2013)  . Bladder suspension  ~ 06/2007  . Vaginal hysterectomy  ` 06/2007  . Diagnostic laparoscopy  1990's & ~ 2000    "I've had a couple; for endometrosis" (01/24/2013)  . Incisional hernia repair N/A 01/24/2013    Procedure: LAPAROSCOPIC INCISIONAL HERNIA;  Surgeon: Harl Bowie, MD;  Location: [place];  Service: General;  Laterality: N/A;  . Insertion of mesh N/A 01/24/2013    Procedure: INSERTION OF MESH;  Surgeon: Harl Bowie, MD;  Location: [facility];  Service: General;  Laterality: N/A;   Past Medical History  Diagnosis Date  . Anxiety   . Asthma   . History of colonic polyps   .  GERD (gastroesophageal reflux disease)   . Hypertension   . Allergy   . Hyperlipidemia   . ASCVD (arteriosclerotic cardiovascular disease)   . Obesity (BMI 30.0-34.9)   . Sleep apnea     NO MACHINE RECOMMENDED  . TIA (transient ischemic attack) ~ 2009  . Migraines   . Daily headache     "depends on the season" (01/24/2013)  . Arthritis     "just the norm" (01/24/2013)  . Fibromyalgia   . RSD (reflex sympathetic dystrophy)    BP 127/77  Pulse 109  Resp 14  Ht 5' 8"  (1.727 m)  Wt 204 lb (92.534 kg)  BMI 31.03 kg/m2  SpO2 96%     Review of Systems  Constitutional: Positive for diaphoresis.  Respiratory: Positive for cough.   Gastrointestinal:  Positive for constipation.  Musculoskeletal: Positive for arthralgias and myalgias.  Neurological: Positive for dizziness, weakness and numbness.  Psychiatric/Behavioral: Positive for dysphoric mood. The patient is nervous/anxious.   All other systems reviewed and are negative.       Objective:   Physical Exam  Normal pulses bilateral pedal and posterior tibial. Hypersensitivity to touch. No evidence of swelling or erythema at the current time.  Light touch sensation is intact to pinprick sensation is intact but takes longer to identify.  Decreased toe flexion bilateral 4/5 bilateral hip flexion knee extensors 2 minus ankle dorsiflexor 3/5 ankle plantar flexor  3 minus left deltoid, bicep, tricep and grip 5/5 in the right deltoid, bicep, tricep and grip        Assessment & Plan:  1. Complex regional pain syndrome type I left upper extremity continue current medications  2. Patient with numbness and tingling in both feet. Will check EMG/NCV to evaluate for neuropathy. Consider RSD.  International Association for the Study of Pain (IASP) clinical diagnostic criteria ([place] criteria)  continuing pain disproportionate to original injury YES must have reports of at least 1 symptom in 3 of 4 categories  sensory - allodynia and/or hyperesthesia YES vasomotor - temperature asymmetry and/or skin color changes and/or skin color asymmetry   sudomotor/edema - edema and/or sweating changes and/or sweating asymmetry No motor/trophic - decreased range of motion and/or motor dysfunction (weakness, tremor, dystonia) and/or trophic changes (in hair, nails, or skin) must have at least 1 sign at time of evaluation in 2 or more categories  sensory - allodynia (to light touch and/or temperature and/or deep somatic pressure and/or joint movement) and/or hyperalgesia (to pinprick)  vasomotor - evidence of temperature asymmetry (> 1 degree C [1.8 degrees F]) and/or skin color changes and/or skin  color asymmetry No sudomotor/edema -evidence of edema and/or sweating changes and/or sweating asymmetry No motor/trophic - evidence of decreased range of motion and/or motor dysfunction (weakness, tremor, dystonia) and/or trophic changes (in hair, nails, or skin)  no other diagnosis can better explain signs or symptoms-Needs EMG

## 2013-11-12 NOTE — Patient Instructions (Signed)
Electromyography (EMG) Test This is a test in which very small electrodes are placed into your muscle tissue. It looks at the electrical impulses of your muscle tissue. This test is used to determine whether or not there are involuntary or spontaneous muscle movements. Involuntary or spontaneous means muscle movements that happen by themselves. This may indicate injury or disease of the nerves which supply that muscle. PREPARATION FOR TEST No preparation or fasting is necessary. Some stimulants such as caffeine and tobacco may need to be avoided for 2-3 hours before test or as instructed by your caregiver. NORMAL FINDINGS No evidence of neuromuscular abnormalities. Ranges for normal findings may vary among different laboratories and hospitals. You should always check with your doctor after having lab work or other tests done to discuss the meaning of your test results and whether your values are considered within normal limits. MEANING OF TEST  Your caregiver will go over the test results with you and discuss the importance and meaning of your results, as well as treatment options and the need for additional tests if necessary. OBTAINING THE TEST RESULTS It is your responsibility to obtain your test results. Ask the lab or department performing the test when and how you will get your results. Document Released: 02/04/2005 Document Revised: 12/27/2011 Document Reviewed: 09/13/2008 [facility] Patient Information 2014 [place], [place].

## 2013-11-13 ENCOUNTER — Other Ambulatory Visit: Payer: Self-pay

## 2013-11-13 ENCOUNTER — Telehealth: Payer: Self-pay

## 2013-11-13 NOTE — Telephone Encounter (Signed)
Patient called to say that someone called her and told her that her oxycodone was waiting on authorization.  Patient wanted to make us aware that she had not requested this.  She received a call from [phone].  Patient just wanted to make us aware.

## 2013-11-14 ENCOUNTER — Other Ambulatory Visit: Payer: Self-pay

## 2013-11-14 MED ORDER — CYMBALTA 30 MG PO CPEP: ORAL_CAPSULE | ORAL | Status: DC

## 2013-11-15 ENCOUNTER — Other Ambulatory Visit: Payer: Self-pay

## 2013-11-21 ENCOUNTER — Telehealth: Payer: Self-pay

## 2013-11-21 NOTE — Telephone Encounter (Signed)
Patient called requesting we write a letter for her husband to give his employer so that he can come home from being out of town and to take care of her and her kids.

## 2013-11-22 NOTE — Telephone Encounter (Signed)
I don't think this is medically necessary

## 2013-11-22 NOTE — Telephone Encounter (Signed)
Left message advising patient a letter for her husband to leave work is not medically necessary and that she should look into FMLA for him.

## 2013-12-06 ENCOUNTER — Other Ambulatory Visit: Payer: Self-pay | Admitting: Physical Medicine & Rehabilitation

## 2013-12-11 ENCOUNTER — Other Ambulatory Visit: Payer: Self-pay | Admitting: Physical Medicine & Rehabilitation

## 2013-12-12 ENCOUNTER — Telehealth: Payer: Self-pay | Admitting: Physical Medicine & Rehabilitation

## 2013-12-12 MED ORDER — HYDROCODONE-ACETAMINOPHEN 7.5-325 MG PO TABS
ORAL_TABLET | ORAL | Status: DC
Start: 2013-12-12 — End: 2014-01-01

## 2013-12-12 NOTE — Telephone Encounter (Signed)
rx's printed for Dr Riley KillSwartz to sign due to predicted snow storm tomorrow for her appointment.  She will reschedule when she picks up rx for hydrocodone.  She should have a refill on the tramadol at pharmacy.

## 2013-12-12 NOTE — Telephone Encounter (Signed)
Pt is out of medication HYDROcodone-acetaminophen (NORCO) 7.5-325 MG per tablet and Tramodal... She states she is willing to come in today to pick up Rx... Please call and inform her of her status ... Ad

## 2013-12-13 ENCOUNTER — Encounter: Payer: Worker's Compensation | Attending: Physical Medicine & Rehabilitation

## 2013-12-13 ENCOUNTER — Ambulatory Visit: Payer: Worker's Compensation | Admitting: Physical Medicine & Rehabilitation

## 2013-12-17 ENCOUNTER — Other Ambulatory Visit: Payer: Self-pay | Admitting: Physical Medicine & Rehabilitation

## 2013-12-21 ENCOUNTER — Other Ambulatory Visit: Payer: Self-pay | Admitting: Internal Medicine

## 2014-01-01 ENCOUNTER — Other Ambulatory Visit: Payer: Self-pay | Admitting: *Deleted

## 2014-01-01 MED ORDER — HYDROCODONE-ACETAMINOPHEN 7.5-325 MG PO TABS: ORAL_TABLET | ORAL | Status: DC

## 2014-01-01 MED ORDER — TRAMADOL HCL 50 MG PO TABS: 50.0000 mg | ORAL_TABLET | Freq: Two times a day (BID) | ORAL | Status: DC

## 2014-01-01 NOTE — Telephone Encounter (Signed)
RX printed for MD to sign for RN visit 01/07/14 

## 2014-01-02 ENCOUNTER — Encounter: Payer: Self-pay | Admitting: Cardiology

## 2014-01-17 ENCOUNTER — Ambulatory Visit (INDEPENDENT_AMBULATORY_CARE_PROVIDER_SITE_OTHER): Payer: BC Managed Care – PPO | Admitting: Internal Medicine

## 2014-01-17 ENCOUNTER — Encounter: Payer: Self-pay | Admitting: Internal Medicine

## 2014-01-17 VITALS — BP 126/82 | HR 108 | Temp 99.9°F | Wt 199.2 lb

## 2014-01-17 DIAGNOSIS — J209 Acute bronchitis, unspecified: Secondary | ICD-10-CM

## 2014-01-17 DIAGNOSIS — J019 Acute sinusitis, unspecified: Secondary | ICD-10-CM

## 2014-01-17 MED ORDER — HYDROCODONE-HOMATROPINE 5-1.5 MG/5ML PO SYRP: 5.0000 mL | ORAL_SOLUTION | Freq: Three times a day (TID) | ORAL | Status: DC | PRN

## 2014-01-17 MED ORDER — AMOXICILLIN-POT CLAVULANATE 875-125 MG PO TABS: 1.0000 | ORAL_TABLET | Freq: Two times a day (BID) | ORAL | Status: DC

## 2014-01-17 NOTE — Patient Instructions (Addendum)
Acute Bronchitis Bronchitis is inflammation of the airways that extend from the windpipe into the lungs (bronchi). The inflammation often causes mucus to develop. This leads to a cough, which is the most common symptom of bronchitis.  In acute bronchitis, the condition usually develops suddenly and goes away over time, usually in a couple weeks. Smoking, allergies, and asthma can make bronchitis worse. Repeated episodes of bronchitis may cause further lung problems.  CAUSES Acute bronchitis is most often caused by the same virus that causes a cold. The virus can spread from person to person (contagious).  SIGNS AND SYMPTOMS   Cough.   Fever.   Coughing up mucus.   Body aches.   Chest congestion.   Chills.   Shortness of breath.   Sore throat.  DIAGNOSIS  Acute bronchitis is usually diagnosed through a physical exam. Tests, such as chest X-rays, are sometimes done to rule out other conditions.  TREATMENT  Acute bronchitis usually goes away in a couple weeks. Often times, no medical treatment is necessary. Medicines are sometimes given for relief of fever or cough. Antibiotics are usually not needed but may be prescribed in certain situations. In some cases, an inhaler may be recommended to help reduce shortness of breath and control the cough. A cool mist vaporizer may also be used to help thin bronchial secretions and make it easier to clear the chest.  HOME CARE INSTRUCTIONS  Get plenty of rest.   Drink enough fluids to keep your urine clear or pale yellow (unless you have a medical condition that requires fluid restriction). Increasing fluids may help thin your secretions and will prevent dehydration.   Only take over-the-counter or prescription medicines as directed by your health care provider.   Avoid smoking and secondhand smoke. Exposure to cigarette smoke or irritating chemicals will make bronchitis worse. If you are a smoker, consider using nicotine gum or skin  patches to help control withdrawal symptoms. Quitting smoking will help your lungs heal faster.   Reduce the chances of another bout of acute bronchitis by washing your hands frequently, avoiding people with cold symptoms, and trying not to touch your hands to your mouth, nose, or eyes.   Follow up with your health care provider as directed.  SEEK MEDICAL CARE IF: Your symptoms do not improve after 1 week of treatment.  SEEK IMMEDIATE MEDICAL CARE IF:  You develop an increased fever or chills.   You have chest pain.   You have severe shortness of breath.  You have bloody sputum.   You develop dehydration.  You develop fainting.  You develop repeated vomiting.  You develop a severe headache. MAKE SURE YOU:   Understand these instructions.  Will watch your condition.  Will get help right away if you are not doing well or get worse. Document Released: 11/11/2004 Document Revised: 06/06/2013 Document Reviewed: 03/27/2013 Exit[employer] Patient Information 2014 [employer].  

## 2014-01-17 NOTE — Progress Notes (Signed)
HPI  Pt presents to the clinic today with c/o cough and chest congestion, body aches and chills. She reports this started 4 days ago. The cough is productive of green/yellow mucous.She also has nasal congestion, headache. She has tried Coricidin, tylenol cold and flu. She does have a history of asthma and allergy. She has had sick contacts.  Review of Systems      Past Medical History  Diagnosis Date  . Anxiety   . Asthma   . History of colonic polyps   . GERD (gastroesophageal reflux disease)   . Hypertension   . Allergy   . Hyperlipidemia   . ASCVD (arteriosclerotic cardiovascular disease)   . Obesity (BMI 30.0-34.9)   . Sleep apnea     NO MACHINE RECOMMENDED  . TIA (transient ischemic attack) ~ 2009  . Migraines   . Daily headache     "depends on the season" (01/24/2013)  . Arthritis     "just the norm" (01/24/2013)  . Fibromyalgia   . RSD (reflex sympathetic dystrophy)     Family History  Problem Relation Age of Onset  . Cancer Mother     lung  . Hypertension Mother   . Cancer Father     colon  . Hypertension Sister   . Birth defects Maternal Grandmother     breast  . Birth defects Paternal Grandmother     uterine, stomach, lung    History   Social History  . Marital Status: Married    Spouse Name: N/A    Number of Children: 2  . Years of Education: N/A   Occupational History  . Disabled due to RSD    Social History Main Topics  . Smoking status: Never Smoker   . Smokeless tobacco: Never Used  . Alcohol Use: Yes     Comment: 01/24/2013 "drink or 2 once or /twice/year, if that"  . Drug Use: No  . Sexual Activity: Not Currently   Other Topics Concern  . Not on file   Social History Narrative  . No narrative on file    Allergies  Allergen Reactions  . Demerol [Meperidine] Anaphylaxis, Itching and Swelling  . Cymbalta [Duloxetine Hcl]     Generic makes her feel like she is crawling out of her skin  . Diazepam     REACTION: swelling  . Meperidine  Hcl     REACTION: swelling  . Other     Axe Cologne     Constitutional: Positive headache, fatigue, or fever. Denies abrupt weight changes.  HEENT:  Pt reports nasal congestion.  Denies eye redness, eye pain, pressure behind the eyes, facial pain, ear pain, ringing in the ears, wax buildup, runny nose or bloody nose. Respiratory: Positive cough. Denies difficulty breathing or shortness of breath.  Cardiovascular: Denies chest pain, chest tightness, palpitations or swelling in the hands or feet.   No other specific complaints in a complete review of systems (except as listed in HPI above).  Objective:    Wt Readings from Last 3 Encounters:  11/12/13 204 lb (92.534 kg)  11/01/13 204 lb (92.534 kg)  10/16/13 206 lb 8 oz (93.668 kg)     General: Appears her stated age, well developed, well nourished in NAD. HEENT: Head: normal shape and size, maxillary sinus tenderness noted; Eyes: sclera white, no icterus, conjunctiva pink, PERRLA and EOMs intact; Ears: Tm's gray and intact, normal light reflex; Nose: mucosa pink and moist, septum midline; Throat/Mouth: + PND. Teeth present, mucosa erythematous and moist,  no exudate noted, no lesions or ulcerations noted.  Neck: Mild cervical lymphadenopathy. Neck supple, trachea midline. No massses, lumps or thyromegaly present.  Cardiovascular: Normal rate and rhythm. S1,S2 noted.  No murmur, rubs or gallops noted. No JVD or BLE edema. No carotid bruits noted. Pulmonary/Chest: Normal effort and coarse rhonchi breath sounds. No respiratory distress. No wheezes, rales noted.      Assessment & Plan:   Acute bronchitis/sinusitis:  Get some rest and drink plenty of water Continue claritin eRx for Augmentin BID x 10 days eRx for Hycodan cough syrup  RTC as needed or if symptoms persist.

## 2014-01-17 NOTE — Progress Notes (Signed)
Pre visit review using our clinic review tool, if applicable. No additional management support is needed unless otherwise documented below in the visit note. 

## 2014-01-21 ENCOUNTER — Ambulatory Visit: Payer: Worker's Compensation | Admitting: Physical Medicine & Rehabilitation

## 2014-01-25 ENCOUNTER — Other Ambulatory Visit: Payer: Self-pay

## 2014-01-25 MED ORDER — CYMBALTA 30 MG PO CPEP: ORAL_CAPSULE | ORAL | Status: DC

## 2014-01-29 ENCOUNTER — Encounter: Payer: Worker's Compensation | Attending: Physical Medicine & Rehabilitation

## 2014-01-29 ENCOUNTER — Ambulatory Visit (HOSPITAL_BASED_OUTPATIENT_CLINIC_OR_DEPARTMENT_OTHER): Payer: Worker's Compensation | Admitting: Physical Medicine & Rehabilitation

## 2014-01-29 ENCOUNTER — Ambulatory Visit: Payer: Worker's Compensation | Admitting: Physical Medicine & Rehabilitation

## 2014-01-29 ENCOUNTER — Telehealth: Payer: Self-pay

## 2014-01-29 ENCOUNTER — Encounter: Payer: Self-pay | Admitting: Physical Medicine & Rehabilitation

## 2014-01-29 VITALS — BP 121/78 | HR 70 | Resp 14 | Ht 67.0 in | Wt 199.6 lb

## 2014-01-29 DIAGNOSIS — R209 Unspecified disturbances of skin sensation: Secondary | ICD-10-CM | POA: Diagnosis not present

## 2014-01-29 DIAGNOSIS — G9050 Complex regional pain syndrome I, unspecified: Secondary | ICD-10-CM | POA: Insufficient documentation

## 2014-01-29 DIAGNOSIS — M7989 Other specified soft tissue disorders: Secondary | ICD-10-CM | POA: Diagnosis not present

## 2014-01-29 DIAGNOSIS — I2510 Atherosclerotic heart disease of native coronary artery without angina pectoris: Secondary | ICD-10-CM | POA: Insufficient documentation

## 2014-01-29 DIAGNOSIS — Z860100 Personal history of colon polyps, unspecified: Secondary | ICD-10-CM | POA: Insufficient documentation

## 2014-01-29 DIAGNOSIS — IMO0001 Reserved for inherently not codable concepts without codable children: Secondary | ICD-10-CM | POA: Diagnosis not present

## 2014-01-29 DIAGNOSIS — Z8673 Personal history of transient ischemic attack (TIA), and cerebral infarction without residual deficits: Secondary | ICD-10-CM | POA: Insufficient documentation

## 2014-01-29 DIAGNOSIS — J45909 Unspecified asthma, uncomplicated: Secondary | ICD-10-CM | POA: Diagnosis not present

## 2014-01-29 DIAGNOSIS — F411 Generalized anxiety disorder: Secondary | ICD-10-CM | POA: Insufficient documentation

## 2014-01-29 DIAGNOSIS — E785 Hyperlipidemia, unspecified: Secondary | ICD-10-CM | POA: Diagnosis not present

## 2014-01-29 DIAGNOSIS — I10 Essential (primary) hypertension: Secondary | ICD-10-CM | POA: Insufficient documentation

## 2014-01-29 DIAGNOSIS — K219 Gastro-esophageal reflux disease without esophagitis: Secondary | ICD-10-CM | POA: Diagnosis not present

## 2014-01-29 MED ORDER — HYDROCODONE-ACETAMINOPHEN 7.5-325 MG PO TABS: ORAL_TABLET | ORAL | Status: DC

## 2014-01-29 NOTE — Telephone Encounter (Signed)
Dana at [facility] wants to clarify if patient needs a consultation or doppler? If patient just needs the doppler she said the order can be placed in EPIC instead of a referral.

## 2014-01-29 NOTE — Patient Instructions (Signed)
[facility]erral [facility] for venous doppler study

## 2014-01-29 NOTE — Progress Notes (Signed)
EMG performed 01/29/2014.  See EMG report under media tab.

## 2014-01-30 NOTE — Telephone Encounter (Signed)
veonous dopple and consultation for leg swelling

## 2014-01-30 NOTE — Telephone Encounter (Signed)
I notified [facility] and they will call the patient and schedule.

## 2014-02-12 ENCOUNTER — Other Ambulatory Visit: Payer: Self-pay | Admitting: Physical Medicine & Rehabilitation

## 2014-02-25 ENCOUNTER — Encounter: Payer: Self-pay | Admitting: Surgery

## 2014-02-25 ENCOUNTER — Encounter (HOSPITAL_COMMUNITY): Payer: Self-pay

## 2014-02-26 ENCOUNTER — Encounter: Payer: Self-pay | Admitting: Registered Nurse

## 2014-02-26 ENCOUNTER — Encounter: Payer: Worker's Compensation | Attending: Physical Medicine & Rehabilitation | Admitting: Registered Nurse

## 2014-02-26 VITALS — BP 126/76 | HR 89 | Resp 14 | Ht 67.0 in | Wt 196.0 lb

## 2014-02-26 DIAGNOSIS — I2510 Atherosclerotic heart disease of native coronary artery without angina pectoris: Secondary | ICD-10-CM | POA: Diagnosis not present

## 2014-02-26 DIAGNOSIS — R209 Unspecified disturbances of skin sensation: Secondary | ICD-10-CM | POA: Insufficient documentation

## 2014-02-26 DIAGNOSIS — E785 Hyperlipidemia, unspecified: Secondary | ICD-10-CM | POA: Diagnosis not present

## 2014-02-26 DIAGNOSIS — K219 Gastro-esophageal reflux disease without esophagitis: Secondary | ICD-10-CM | POA: Diagnosis not present

## 2014-02-26 DIAGNOSIS — Z8673 Personal history of transient ischemic attack (TIA), and cerebral infarction without residual deficits: Secondary | ICD-10-CM | POA: Insufficient documentation

## 2014-02-26 DIAGNOSIS — G9050 Complex regional pain syndrome I, unspecified: Secondary | ICD-10-CM | POA: Diagnosis not present

## 2014-02-26 DIAGNOSIS — IMO0001 Reserved for inherently not codable concepts without codable children: Secondary | ICD-10-CM | POA: Insufficient documentation

## 2014-02-26 DIAGNOSIS — J45909 Unspecified asthma, uncomplicated: Secondary | ICD-10-CM | POA: Diagnosis not present

## 2014-02-26 DIAGNOSIS — Z860100 Personal history of colon polyps, unspecified: Secondary | ICD-10-CM | POA: Insufficient documentation

## 2014-02-26 DIAGNOSIS — I10 Essential (primary) hypertension: Secondary | ICD-10-CM | POA: Insufficient documentation

## 2014-02-26 DIAGNOSIS — Z79899 Other long term (current) drug therapy: Secondary | ICD-10-CM

## 2014-02-26 DIAGNOSIS — Z5181 Encounter for therapeutic drug level monitoring: Secondary | ICD-10-CM

## 2014-02-26 DIAGNOSIS — F411 Generalized anxiety disorder: Secondary | ICD-10-CM | POA: Insufficient documentation

## 2014-02-26 MED ORDER — HYDROCODONE-ACETAMINOPHEN 7.5-325 MG PO TABS: ORAL_TABLET | ORAL | Status: DC

## 2014-02-26 MED ORDER — TRAMADOL HCL ER 100 MG PO TB24: 100.0000 mg | ORAL_TABLET | Freq: Every day | ORAL | Status: DC

## 2014-02-26 MED ORDER — TRAMADOL HCL 50 MG PO TABS: 50.0000 mg | ORAL_TABLET | Freq: Two times a day (BID) | ORAL | Status: DC

## 2014-02-26 NOTE — Progress Notes (Signed)
Subjective:    Patient ID: [name], female    DOB: 11/23/1965, 48 y.o.   MRN: [id number]  HPI: [name] is a 48 year old female who returns for follow up for chronic pain and medication refill. She says her pain is in her left hand, arm and left lower leg. All pain is reflected on left side.She rates her pain 10. Her current exercise regime is stretching exercises.  She admits to being in pain all the time, she ordered an orthotic slipper. She is wearing slippers she says this is the only shoe she can wear due to intensity of pain. Awaiting on insurance approval for test ordered by Dr.Kirsteins last month. Pain Inventory Average Pain 9 Pain Right Now 10 My pain is constant, sharp, burning, dull, stabbing, tingling and aching  In the last 24 hours, has pain interfered with the following? General activity 8 Relation with others 8 Enjoyment of life 8 What TIME of day is your pain at its worst? constant all day Sleep (in general) Poor  Pain is worse with: walking, bending, sitting, inactivity and standing Pain improves with: rest, heat/ice, therapy/exercise, pacing activities, medication and TENS Relief from Meds: 6  Mobility walk without assistance ability to climb steps?  yes do you drive?  yes  Function disabled: date disabled na  Neuro/Psych bladder control problems bowel control problems weakness numbness tremor tingling depression anxiety suicidal thoughts  Prior Studies Any changes since last visit?  no  Physicians involved in your care Any changes since last visit?  no   Family History  Problem Relation Age of Onset  . Cancer Mother     lung  . Hypertension Mother   . Cancer Father     colon  . Hypertension Sister   . Birth defects Maternal Grandmother     breast  . Birth defects Paternal Grandmother     uterine, stomach, lung   History   Social History  . Marital Status: Married    Spouse Name: N/A    Number of Children: 2    . Years of Education: N/A   Occupational History  . Disabled due to RSD    Social History Main Topics  . Smoking status: Never Smoker   . Smokeless tobacco: Never Used  . Alcohol Use: Yes     Comment: 01/24/2013 "drink or 2 once or /twice/year, if that"  . Drug Use: No  . Sexual Activity: Not Currently   Other Topics Concern  . None   Social History Narrative  . None   Past Surgical History  Procedure Laterality Date  . Oophorectomy Right 2009  . Laparoscopic incisional / umbilical / ventral hernia repair  01/24/2013    IHR w/mesh/notes  . Hernia repair    . Tonsillectomy  1990's  . Nasal septum surgery  1980's?  . Appendectomy  ~ 06/2007  . Bladder repair  ~ 06/2007    "same day after bladder lift" (01/24/2013)  . Bladder suspension  ~ 06/2007  . Vaginal hysterectomy  ` 06/2007  . Diagnostic laparoscopy  1990's & ~ 2000    "I've had a couple; for endometrosis" (01/24/2013)  . Incisional hernia repair N/A 01/24/2013    Procedure: LAPAROSCOPIC INCISIONAL HERNIA;  Surgeon: Shelly Rubensteinouglas A Blackman, MD;  Location: [facility] OR;  Service: General;  Laterality: N/A;  . Insertion of mesh N/A 01/24/2013    Procedure: INSERTION OF MESH;  Surgeon: Shelly Rubensteinouglas A Blackman, MD;  Location: [facility] OR;  Service: General;  Laterality: N/A;   Past Medical History  Diagnosis Date  . Anxiety   . Asthma   . History of colonic polyps   . GERD (gastroesophageal reflux disease)   . Hypertension   . Allergy   . Hyperlipidemia   . ASCVD (arteriosclerotic cardiovascular disease)   . Obesity (BMI 30.0-34.9)   . Sleep apnea     NO MACHINE RECOMMENDED  . TIA (transient ischemic attack) ~ 2009  . Migraines   . Daily headache     "depends on the season" (01/24/2013)  . Arthritis     "just the norm" (01/24/2013)  . Fibromyalgia   . RSD (reflex sympathetic dystrophy)    BP 126/76  Pulse 89  Resp 14  Ht 5\' 7"  (1.702 m)  Wt 196 lb (88.905 kg)  BMI 30.69 kg/m2  SpO2 98%  Opioid Risk Score:   Fall Risk Score: Moderate  Fall Risk (6-13 points) (pt educated on fall risk, brochure given to pt previously)    Review of Systems  Genitourinary:       Bowel and bladder control problems  Neurological: Positive for tremors, weakness and numbness.       Tingling  Psychiatric/Behavioral: Positive for suicidal ideas. The patient is nervous/anxious.        Depression, pt doesn't have plan, pt states she should not have checked suicidal thoughts.   All other systems reviewed and are negative.      Objective:   Physical Exam  Nursing note and vitals reviewed. Constitutional: She is oriented to person, place, and time. She appears well-developed and well-nourished.  HENT:  Head: Normocephalic and atraumatic.  Neck: Normal range of motion. Neck supple.  Cardiovascular: Normal rate, regular rhythm and normal heart sounds.   Pulmonary/Chest: Effort normal and breath sounds normal.  Musculoskeletal:  Normal Muscle Bulk: Muscle testing reveals: Upper Extremities: Decreased ROM: Right arm: 90 degrees/ Muscle Strength 4/5 Left Arm: 60 degree/ Muscle Strength 3/5 Lower Extremities: Bilateral Leg Flexion produces pain into feet. Bilateral Lower extremities with hypersensitivity to touch. Antalgic Gait Arises from chair with ease   Neurological: She is alert and oriented to person, place, and time.  Skin: Skin is warm and dry.  Psychiatric: She has a normal mood and affect.          Assessment & Plan:  1. Complex Regional Pain Syndrome Type 1 Refilled: Hydro-codone 7.5/325mg  one tablet at bedtime #30. Tramadol 100 mg ER daily #30 and Tramadol 50 mg one tablet by mouth BID #60.  20 minutes of face to face patient care time was spent during this visit. All questions were encouraged and answered.  F/U in 1 month

## 2014-03-01 ENCOUNTER — Encounter: Payer: Self-pay | Admitting: Internal Medicine

## 2014-03-01 DIAGNOSIS — Z0289 Encounter for other administrative examinations: Secondary | ICD-10-CM

## 2014-03-02 ENCOUNTER — Other Ambulatory Visit: Payer: Self-pay | Admitting: Physical Medicine & Rehabilitation

## 2014-03-04 ENCOUNTER — Other Ambulatory Visit: Payer: Self-pay | Admitting: Physical Medicine & Rehabilitation

## 2014-03-07 ENCOUNTER — Encounter: Payer: Self-pay | Admitting: Vascular Surgery

## 2014-03-08 ENCOUNTER — Encounter: Payer: Self-pay | Admitting: Vascular Surgery

## 2014-03-08 ENCOUNTER — Encounter (HOSPITAL_COMMUNITY): Payer: Self-pay

## 2014-03-12 ENCOUNTER — Other Ambulatory Visit: Payer: Self-pay | Admitting: Physical Medicine & Rehabilitation

## 2014-03-13 ENCOUNTER — Encounter: Payer: Self-pay | Admitting: Physical Medicine & Rehabilitation

## 2014-03-13 ENCOUNTER — Telehealth: Payer: Self-pay

## 2014-03-13 NOTE — Telephone Encounter (Signed)
Patient called requesting a letter stating why she is taking the cymbalta for her workers comp to cover this medication.  Please fax to [phone]  Claim # [id number]

## 2014-03-20 ENCOUNTER — Telehealth: Payer: Self-pay | Admitting: Physical Medicine & Rehabilitation

## 2014-03-20 NOTE — Telephone Encounter (Signed)
Pt called and asked for a letter to be faxed to Workers Comp in regards to specific medication . Letter was already in system as of 03/13/14. Re-faxed to [phone] attn: Candise Che per patient. Confirmation received. jmn

## 2014-03-27 ENCOUNTER — Encounter: Payer: Worker's Compensation | Attending: Physical Medicine & Rehabilitation | Admitting: Registered Nurse

## 2014-03-27 ENCOUNTER — Encounter: Payer: Self-pay | Admitting: Registered Nurse

## 2014-03-27 VITALS — BP 124/81 | HR 89 | Resp 14 | Ht 67.0 in | Wt 196.0 lb

## 2014-03-27 DIAGNOSIS — Z5181 Encounter for therapeutic drug level monitoring: Secondary | ICD-10-CM

## 2014-03-27 DIAGNOSIS — G9050 Complex regional pain syndrome I, unspecified: Secondary | ICD-10-CM | POA: Diagnosis present

## 2014-03-27 DIAGNOSIS — Z79899 Other long term (current) drug therapy: Secondary | ICD-10-CM

## 2014-03-27 MED ORDER — HYDROCODONE-ACETAMINOPHEN 7.5-325 MG PO TABS: ORAL_TABLET | ORAL | Status: DC

## 2014-03-27 MED ORDER — TRAMADOL HCL 50 MG PO TABS: 50.0000 mg | ORAL_TABLET | Freq: Three times a day (TID) | ORAL | Status: DC

## 2014-03-27 NOTE — Progress Notes (Signed)
Subjective:    Patient ID: [name], female    DOB: [birth year], 48 y.o.   MRN: [id number]  HPI: [name] is a 48 year old female who returns for follow up for chronic pain and medication refill. She says her pain is located in her left arm and bilateral feet.She rates her pain 10, She says she is always in pain and her feet have increased in intensity of pain and the burning sensations at times have become unbearable. She says she has tried various expensive shoes, sandals and flip flops but they all hurt her feet she feels as though she standing on rocks. The only thing that gives her comfort is slippers. She's wearing slippers at [facility] time also,  Her current exercise regime is stretching exercises daily and she will be starting pool therapy. She has been using her [facility] , it has broken will place an order.She admits to feeling depressed, she had ran out of her cymbalta she was waiting on insurance approval. Her friend gave her cymbalta to help her until her prior authorization was approved. Educated on taking other people medications and she verbalized understanding. She admits the only reason she was feeling so depressed and down. Her children were in the room she wasn't able to express herself fully.  Pain Inventory Average Pain 10 Pain Right Now 10 My pain is intermittent, constant, sharp, burning, stabbing, tingling and aching  In the last 24 hours, has pain interfered with the following? General activity 8 Relation with others 8 Enjoyment of life 8 What TIME of day is your pain at its worst? constant Sleep (in general) Poor  Pain is worse with: walking, bending, sitting, inactivity and unsure Pain improves with: rest, heat/ice, therapy/exercise, pacing activities, medication and TENS Relief from Meds: 5  Mobility ability to climb steps?  yes do you drive?  yes  Function Do you have any goals in this area?   no  Neuro/Psych weakness numbness tremor tingling spasms dizziness depression  Prior Studies Any changes since last visit?  no  Physicians involved in your care Any changes since last visit?  no   Family History  Problem Relation Age of Onset  . Cancer Mother     lung  . Hypertension Mother   . Cancer Father     colon  . Hypertension Sister   . Birth defects Maternal Grandmother     breast  . Birth defects Paternal Grandmother     uterine, stomach, lung   History   Social History  . Marital Status: Married    Spouse Name: N/A    Number of Children: 2  . Years of Education: N/A   Occupational History  . Disabled due to RSD    Social History Main Topics  . Smoking status: Never Smoker   . Smokeless tobacco: Never Used  . Alcohol Use: Yes     Comment: 01/24/2013 "drink or 2 once or /twice/year, if that"  . Drug Use: No  . Sexual Activity: Not Currently   Other Topics Concern  . None   Social History Narrative  . None   Past Surgical History  Procedure Laterality Date  . Oophorectomy Right 2009  . Laparoscopic incisional / umbilical / ventral hernia repair  01/24/2013    IHR w/mesh/notes  . Hernia repair    . Tonsillectomy  1990's  . Nasal septum surgery  1980's?  . Appendectomy  ~ 06/2007  . Bladder repair  ~ 06/2007    "  same day after bladder lift" (01/24/2013)  . Bladder suspension  ~ 06/2007  . Vaginal hysterectomy  ` 06/2007  . Diagnostic laparoscopy  1990's & ~ 2000    "I've had a couple; for endometrosis" (01/24/2013)  . Incisional hernia repair N/A 01/24/2013    Procedure: LAPAROSCOPIC INCISIONAL HERNIA;  Surgeon: Shelly Rubensteinouglas A Blackman, MD;  Location: [facility];  Service: General;  Laterality: N/A;  . Insertion of mesh N/A 01/24/2013    Procedure: INSERTION OF MESH;  Surgeon: Shelly Rubensteinouglas A Blackman, MD;  Location: [facility];  Service: General;  Laterality: N/A;   Past Medical History  Diagnosis Date  . Anxiety   . Asthma   . History of colonic polyps   . GERD  (gastroesophageal reflux disease)   . Hypertension   . Allergy   . Hyperlipidemia   . ASCVD (arteriosclerotic cardiovascular disease)   . Obesity (BMI 30.0-34.9)   . Sleep apnea     NO MACHINE RECOMMENDED  . TIA (transient ischemic attack) ~ 2009  . Migraines   . Daily headache     "depends on the season" (01/24/2013)  . Arthritis     "just the norm" (01/24/2013)  . Fibromyalgia   . RSD (reflex sympathetic dystrophy)    BP 124/81  Pulse 89  Resp 14  Ht 5\' 7"  (1.702 m)  Wt 196 lb (88.905 kg)  BMI 30.69 kg/m2  SpO2 98%  Opioid Risk Score:   Fall Risk Score: Moderate Fall Risk (6-13 points) (patient educated handout declined)   Review of Systems  Musculoskeletal: Positive for arthralgias, back pain, myalgias and neck pain.  Neurological: Positive for dizziness, tremors, weakness and numbness.  Psychiatric/Behavioral: Positive for dysphoric mood.  All other systems reviewed and are negative.      Objective:   Physical Exam  Nursing note and vitals reviewed. Constitutional: She is oriented to person, place, and time. She appears well-developed and well-nourished.  HENT:  Head: Normocephalic and atraumatic.  Neck: Normal range of motion. Neck supple.  Cardiovascular: Normal rate and regular rhythm.   Pulmonary/Chest: Effort normal and breath sounds normal.  Musculoskeletal:  Normal Muscle Bulk and Muscle Testing Reveals: Upper Extremities: Right Ar, Full ROM and Muscle Strength 5/5 Left arm Decreased ROM 80 degrees and muscle strength 5/5 Back without spinal or paraspinal tenderness Lower Extremities: Full ROM and Muscle Strength Flexion Produces Tingling into sole of feet. Hypersensitivity with palpation of feet. Arises from chair with eases Antalgic Gait   Neurological: She is alert and oriented to person, place, and time.  Skin: Skin is warm and dry.  Psychiatric: She has a normal mood and affect.  Admits to feeling Depressed/ No interest in harming herself or  others          Assessment & Plan:  1. Complex Regional Pain Syndrome Type 1  Refilled: Hydro-codone 7.5/325mg  one tablet at bedtime #30. Tramadol 100 mg ER daily #30 and Tramadol 50 mg one tablet by mouth TID #90.  2. Depression: Cymbalta has been approved and she will continue current dose.  20 minutes of face to face patient care time was spent during this visit. All questions were encouraged and answered.   F/U in 1 month

## 2014-04-03 ENCOUNTER — Ambulatory Visit (INDEPENDENT_AMBULATORY_CARE_PROVIDER_SITE_OTHER): Payer: BC Managed Care – PPO | Admitting: Internal Medicine

## 2014-04-03 ENCOUNTER — Encounter: Payer: Self-pay | Admitting: Internal Medicine

## 2014-04-03 VITALS — BP 114/72 | HR 86 | Temp 98.1°F | Wt 196.0 lb

## 2014-04-03 DIAGNOSIS — R059 Cough, unspecified: Secondary | ICD-10-CM

## 2014-04-03 DIAGNOSIS — R05 Cough: Secondary | ICD-10-CM

## 2014-04-03 DIAGNOSIS — J019 Acute sinusitis, unspecified: Secondary | ICD-10-CM

## 2014-04-03 MED ORDER — FLUTICASONE PROPIONATE 50 MCG/ACT NA SUSP: 2.0000 | Freq: Every day | NASAL | Status: DC

## 2014-04-03 MED ORDER — AMOXICILLIN-POT CLAVULANATE 875-125 MG PO TABS: 1.0000 | ORAL_TABLET | Freq: Two times a day (BID) | ORAL | Status: DC

## 2014-04-03 MED ORDER — HYDROCODONE-HOMATROPINE 5-1.5 MG/5ML PO SYRP: 5.0000 mL | ORAL_SOLUTION | Freq: Three times a day (TID) | ORAL | Status: DC | PRN

## 2014-04-03 NOTE — Progress Notes (Signed)
HPI  Pt presents to the clinic today with c/o headache, facial pain and pressure and cough. She reports this started 1 week ago. The cough is non productive. She is blowing yellow/green mucous out of her nose. She does report running fevers up to 101.9. She has tried coricidin and ibuprofen without much relief. She does have a history of allergies, asthma and recurrent sinus infections.  Review of Systems    Past Medical History  Diagnosis Date  . Anxiety   . Asthma   . History of colonic polyps   . GERD (gastroesophageal reflux disease)   . Hypertension   . Allergy   . Hyperlipidemia   . ASCVD (arteriosclerotic cardiovascular disease)   . Obesity (BMI 30.0-34.9)   . Sleep apnea     NO MACHINE RECOMMENDED  . TIA (transient ischemic attack) ~ 2009  . Migraines   . Daily headache     "depends on the season" (01/24/2013)  . Arthritis     "just the norm" (01/24/2013)  . Fibromyalgia   . RSD (reflex sympathetic dystrophy)     Family History  Problem Relation Age of Onset  . Cancer Mother     lung  . Hypertension Mother   . Cancer Father     colon  . Hypertension Sister   . Birth defects Maternal Grandmother     breast  . Birth defects Paternal Grandmother     uterine, stomach, lung    History   Social History  . Marital Status: Married    Spouse Name: N/A    Number of Children: 2  . Years of Education: N/A   Occupational History  . Disabled due to RSD    Social History Main Topics  . Smoking status: Never Smoker   . Smokeless tobacco: Never Used  . Alcohol Use: Yes     Comment: 01/24/2013 "drink or 2 once or /twice/year, if that"  . Drug Use: No  . Sexual Activity: Not Currently   Other Topics Concern  . Not on file   Social History Narrative  . No narrative on file    Allergies  Allergen Reactions  . Demerol [Meperidine] Anaphylaxis, Itching and Swelling  . Cymbalta [Duloxetine Hcl]     Generic makes her feel like she is crawling out of her skin  .  Diazepam     REACTION: swelling  . Meperidine Hcl     REACTION: swelling  . Other     Axe Cologne     Constitutional: Positive headache, fatigue and fever. Denies abrupt weight changes.  HEENT:  Positive facial pain, nasal congestion and sore throat. Denies eye redness, ear pain, ringing in the ears, wax buildup, runny nose or bloody nose. Respiratory: Positive cough. Denies difficulty breathing or shortness of breath.  Cardiovascular: Denies chest pain, chest tightness, palpitations or swelling in the hands or feet.   No other specific complaints in a complete review of systems (except as listed in HPI above).  Objective:    General: Appears her stated age, well developed, well nourished in NAD. HEENT: Head: normal shape and size, frontal and maxillary sinus tenderness noted; Eyes: sclera white, no icterus, conjunctiva pink, PERRLA and EOMs intact; Ears: Tm's gray and intact, normal light reflex; Nose: mucosa pink and moist, septum midline; Throat/Mouth: + PND. Teeth present, mucosa pink and moist, no exudate noted, no lesions or ulcerations noted.  Neck: Neck supple, trachea midline. No massses, lumps or thyromegaly present.  Cardiovascular: Normal rate and rhythm. S1,S2  noted.  No murmur, rubs or gallops noted. No JVD or BLE edema. No carotid bruits noted. Pulmonary/Chest: Normal effort and positive vesicular breath sounds. No respiratory distress. No wheezes, rales or ronchi noted.      Assessment & Plan:   Acute bacterial sinusitis  Can use a Neti Pot which can be purchased from your local drug store. Flonase 2 sprays each nostril for 3 days and then as needed. Augmentin BID for 10 days eRx for Hycodan for cough  RTC as needed or if symptoms persist.

## 2014-04-03 NOTE — Progress Notes (Signed)
Pre visit review using our clinic review tool, if applicable. No additional management support is needed unless otherwise documented below in the visit note. 

## 2014-04-03 NOTE — Patient Instructions (Addendum)
Sinusitis Sinusitis is redness, soreness, and swelling (inflammation) of the paranasal sinuses. Paranasal sinuses are air pockets within the bones of your face (beneath the eyes, the middle of the forehead, or above the eyes). In healthy paranasal sinuses, mucus is able to drain out, and air is able to circulate through them by way of your nose. However, when your paranasal sinuses are inflamed, mucus and air can become trapped. This can allow bacteria and other germs to grow and cause infection. Sinusitis can develop quickly and last only a short time (acute) or continue over a long period (chronic). Sinusitis that lasts for more than 12 weeks is considered chronic.  CAUSES  Causes of sinusitis include:  Allergies.  Structural abnormalities, such as displacement of the cartilage that separates your nostrils (deviated septum), which can decrease the air flow through your nose and sinuses and affect sinus drainage.  Functional abnormalities, such as when the small hairs (cilia) that line your sinuses and help remove mucus do not work properly or are not present. SYMPTOMS  Symptoms of acute and chronic sinusitis are the same. The primary symptoms are pain and pressure around the affected sinuses. Other symptoms include:  Upper toothache.  Earache.  Headache.  Bad breath.  Decreased sense of smell and taste.  A cough, which worsens when you are lying flat.  Fatigue.  Fever.  Thick drainage from your nose, which often is green and may contain pus (purulent).  Swelling and warmth over the affected sinuses. DIAGNOSIS  Your caregiver will perform a physical exam. During the exam, your caregiver may:  Look in your nose for signs of abnormal growths in your nostrils (nasal polyps).  Tap over the affected sinus to check for signs of infection.  View the inside of your sinuses (endoscopy) with a special imaging device with a light attached (endoscope), which is inserted into your  sinuses. If your caregiver suspects that you have chronic sinusitis, one or more of the following tests may be recommended:  Allergy tests.  Nasal culture--A sample of mucus is taken from your nose and sent to a lab and screened for bacteria.  Nasal cytology--A sample of mucus is taken from your nose and examined by your caregiver to determine if your sinusitis is related to an allergy. TREATMENT  Most cases of acute sinusitis are related to a viral infection and will resolve on their own within 10 days. Sometimes medicines are prescribed to help relieve symptoms (pain medicine, decongestants, nasal steroid sprays, or saline sprays).  However, for sinusitis related to a bacterial infection, your caregiver will prescribe antibiotic medicines. These are medicines that will help kill the bacteria causing the infection.  Rarely, sinusitis is caused by a fungal infection. In theses cases, your caregiver will prescribe antifungal medicine. For some cases of chronic sinusitis, surgery is needed. Generally, these are cases in which sinusitis recurs more than 3 times per year, despite other treatments. HOME CARE INSTRUCTIONS   Drink plenty of water. Water helps thin the mucus so your sinuses can drain more easily.  Use a humidifier.  Inhale steam 3 to 4 times a day (for example, sit in the bathroom with the shower running).  Apply a warm, moist washcloth to your face 3 to 4 times a day, or as directed by your caregiver.  Use saline nasal sprays to help moisten and clean your sinuses.  Take over-the-counter or prescription medicines for pain, discomfort, or fever only as directed by your caregiver. SEEK IMMEDIATE MEDICAL CARE IF:    You have increasing pain or severe headaches.  You have nausea, vomiting, or drowsiness.  You have swelling around your face.  You have vision problems.  You have a stiff neck.  You have difficulty breathing. MAKE SURE YOU:   Understand these  instructions.  Will watch your condition.  Will get help right away if you are not doing well or get worse. Document Released: 10/04/2005 Document Revised: 12/27/2011 Document Reviewed: 10/19/2011 Exit[employer] Patient Information 2015 [employer]. This information is not intended to replace advice given to you by your health care provider. Make sure you discuss any questions you have with your health care provider.  

## 2014-04-17 ENCOUNTER — Other Ambulatory Visit: Payer: Self-pay | Admitting: *Deleted

## 2014-04-17 ENCOUNTER — Encounter: Payer: Self-pay | Admitting: Vascular Surgery

## 2014-04-17 DIAGNOSIS — M7989 Other specified soft tissue disorders: Secondary | ICD-10-CM

## 2014-04-18 ENCOUNTER — Other Ambulatory Visit: Payer: Self-pay | Admitting: Vascular Surgery

## 2014-04-18 ENCOUNTER — Ambulatory Visit (INDEPENDENT_AMBULATORY_CARE_PROVIDER_SITE_OTHER): Payer: Worker's Compensation | Admitting: Vascular Surgery

## 2014-04-18 ENCOUNTER — Encounter: Payer: Self-pay | Admitting: Vascular Surgery

## 2014-04-18 ENCOUNTER — Ambulatory Visit (HOSPITAL_COMMUNITY)
Admission: RE | Admit: 2014-04-18 | Discharge: 2014-04-18 | Disposition: A | Discharge status: Home / Self Care | Payer: Worker's Compensation | Source: Ambulatory Visit | Attending: Vascular Surgery | Admitting: Vascular Surgery

## 2014-04-18 VITALS — BP 115/80 | HR 100 | Ht 67.0 in | Wt 195.0 lb

## 2014-04-18 DIAGNOSIS — M7989 Other specified soft tissue disorders: Secondary | ICD-10-CM | POA: Diagnosis not present

## 2014-04-18 DIAGNOSIS — R609 Edema, unspecified: Secondary | ICD-10-CM

## 2014-04-18 NOTE — Progress Notes (Signed)
Referred by:  Karie Schwalbeichard I Letvak, MD [address] [place], [place] [id number]  Reason for referral: Swollen legs bilaterally  History of Present Illness  [name] is a 48 y.o. (11/21/1965) female with past medical history of complex regional pain syndrome, type 1 affecting her left extremity and lower legs bilaterally who presents with chief complaint: swollen legs.  Patient notes onset of swelling several years ago accompanied by burning, stinging, warmth and numbness. The pain has progressively worsened over the years. She has tried heat application and elevation of her legs with little relief. The pain is not associated with any activities and is sporadic throughout the day.   She denies a history of back injury, DVT, varicose veins, venous stasis ulcers, intermittent claudication, rest pain, and non healing ulcers of the lower extremities. She has never worn compression stockings in the past. She says anything touching her feet is painful and uncomfortable.   She is not diabetic. She has a history of TIA. She is on chronic pain medication. She has been referred to us to rule out any vascular pathology.   Past Medical History  Diagnosis Date  . Anxiety   . Asthma   . History of colonic polyps   . GERD (gastroesophageal reflux disease)   . Hypertension   . Allergy   . Hyperlipidemia   . ASCVD (arteriosclerotic cardiovascular disease)   . Obesity (BMI 30.0-34.9)   . Sleep apnea     NO MACHINE RECOMMENDED  . TIA (transient ischemic attack) ~ 2009  . Migraines   . Daily headache     "depends on the season" (01/24/2013)  . Arthritis     "just the norm" (01/24/2013)  . Fibromyalgia   . RSD (reflex sympathetic dystrophy)     Past Surgical History  Procedure Laterality Date  . Oophorectomy Right 2009  . Laparoscopic incisional / umbilical / ventral hernia repair  01/24/2013    IHR w/mesh/notes  . Hernia repair    . Tonsillectomy  1990's  . Nasal septum surgery  1980's?   . Appendectomy  ~ 06/2007  . Bladder repair  ~ 06/2007    "same day after bladder lift" (01/24/2013)  . Bladder suspension  ~ 06/2007  . Vaginal hysterectomy  ` 06/2007  . Diagnostic laparoscopy  1990's & ~ 2000    "I've had a couple; for endometrosis" (01/24/2013)  . Incisional hernia repair N/A 01/24/2013    Procedure: LAPAROSCOPIC INCISIONAL HERNIA;  Surgeon: Shelly Rubensteinouglas A Blackman, MD;  Location: [facility];  Service: General;  Laterality: N/A;  . Insertion of mesh N/A 01/24/2013    Procedure: INSERTION OF MESH;  Surgeon: Shelly Rubensteinouglas A Blackman, MD;  Location: [facility];  Service: General;  Laterality: N/A;    History   Social History  . Marital Status: Married    Spouse Name: N/A    Number of Children: 2  . Years of Education: N/A   Occupational History  . Disabled due to RSD    Social History Main Topics  . Smoking status: Never Smoker   . Smokeless tobacco: Never Used  . Alcohol Use: Yes     Comment: 01/24/2013 "drink or 2 once or /twice/year, if that"  . Drug Use: No  . Sexual Activity: Not Currently   Other Topics Concern  . Not on file   Social History Narrative  . No narrative on file    Family History  Problem Relation Age of Onset  . Cancer Mother  lung  . Hypertension Mother   . Cancer Father     colon  . Hypertension Sister   . Cancer Sister   . Birth defects Maternal Grandmother     breast  . Birth defects Paternal Grandmother     uterine, stomach, lung  . Cancer Brother   . Hyperlipidemia Sister       Current Outpatient Prescriptions on File Prior to Visit  Medication Sig Dispense Refill  . albuterol (PROAIR HFA) 108 (90 BASE) MCG/ACT inhaler Inhale 2 puffs into the lungs every 4 (four) hours as needed.  1 Inhaler  4  . amoxicillin-clavulanate (AUGMENTIN) 875-125 MG per tablet Take 1 tablet by mouth 2 (two) times daily.  20 tablet  0  . CYMBALTA 30 MG capsule TAKE 3 CAPSULES BY MOUTH EVERY DAY  270 capsule  0  . esomeprazole (NEXIUM) 40 MG capsule TAKE 1 CAPSULE  (40 MG TOTAL) BY MOUTH 2 (TWO) TIMES DAILY.  60 capsule  11  . fluticasone (FLONASE) 50 MCG/ACT nasal spray Place 2 sprays into both nostrils daily.  16 g  6  . HYDROcodone-acetaminophen (NORCO) 7.5-325 MG per tablet TAKE 1 TABLET BY MOUTH AT BEDTIME AS NEEDED  30 tablet  0  . HYDROcodone-homatropine (HYCODAN) 5-1.5 MG/5ML syrup Take 5 mLs by mouth every 8 (eight) hours as needed for cough.  120 mL  0  . ibuprofen (ADVIL,MOTRIN) 600 MG tablet TAKE 1 TABLET (600 MG TOTAL) BY MOUTH 3 (THREE) TIMES DAILY.  90 tablet  11  . lidocaine (LIDODERM) 5 % PLACE 1 PATCH ONTO THE SKIN DAILY. REMOVE & DISCARD PATCH WITHIN 12 HOURS OR AS DIRECTED BY MD  30 patch  2  . loratadine (CLARITIN) 10 MG tablet Take 10 mg by mouth daily.        Marland Kitchen. losartan-hydrochlorothiazide (HYZAAR) 100-25 MG per tablet Take 1 tablet by mouth daily.  90 tablet  3  . PEG 3350-KCl-NaBcb-NaCl-NaSulf (PEG 3350/ELECTROLYTES) 240 G SOLR Take 1 Container by mouth once. Colonoscopy prep      . tiZANidine (ZANAFLEX) 4 MG tablet TAKE 1 TABLET (4 MG TOTAL) BY MOUTH 4 (FOUR) TIMES DAILY.  120 tablet  2  . topiramate (TOPAMAX) 200 MG tablet TAKE 1 TABLET (200 MG TOTAL) BY MOUTH DAILY.  30 tablet  2  . traMADol (ULTRAM) 50 MG tablet Take 1 tablet (50 mg total) by mouth 3 (three) times daily.  90 tablet  3  . traMADol (ULTRAM-ER) 100 MG 24 hr tablet Take 1 tablet (100 mg total) by mouth daily.  30 tablet  2   No current facility-administered medications on file prior to visit.    Allergies  Allergen Reactions  . Demerol [Meperidine] Anaphylaxis, Itching and Swelling  . Cymbalta [Duloxetine Hcl]     Generic makes her feel like she is crawling out of her skin  . Diazepam     REACTION: swelling  . Meperidine Hcl     REACTION: swelling  . Other     Axe Cologne      REVIEW OF SYSTEMS:  (Positives checked otherwise negative)  CARDIOVASCULAR:  []  chest pain, []  chest pressure, []  palpitations, []  shortness of breath when laying flat, []   shortness of breath with exertion,  [x]  pain in feet when walking, [x]  pain in feet when laying flat, []  history of blood clot in veins (DVT), []  history of phlebitis, [x]  swelling in legs, []  varicose veins  PULMONARY:  []  productive cough, []  asthma, []  wheezing  NEUROLOGIC:  [  x] weakness in arms or legs, [x]  numbness in arms or legs, []  difficulty speaking or slurred speech, []  temporary loss of vision in one eye, []  dizziness  HEMATOLOGIC:  []  bleeding problems, []  problems with blood clotting too easily  MUSCULOSKEL:  []  joint pain, []  joint swelling  GASTROINTEST:  []  vomiting blood, []  blood in stool     GENITOURINARY:  []  burning with urination, []  blood in urine  PSYCHIATRIC:  []  history of major depression  INTEGUMENTARY:  []  rashes, []  ulcers  CONSTITUTIONAL:  []  fever, []  chills   Physical Examination Filed Vitals:   04/18/14 1434  BP: 115/80  Pulse: 100  Height: 5\' 7"  (1.702 m)  Weight: 195 lb (88.451 kg)  SpO2: 97%   Body mass index is 30.53 kg/(m^2).  General: A&O x 3, WDWN female in NAD  Head: [place]/AT  Ear/Nose/Throat: Hearing grossly intact, nares w/o erythema or drainage  Eyes: PERRLA, EOMI  Neck: Supple, no nuchal rigidity  Pulmonary: Sym exp, good air movt, CTAB, no rales, rhonchi, & wheezing  Cardiac: RRR, Nl S1, S2, no Murmurs, rubs or gallops  Vascular: Vessel Right Left  Radial Palpable Palpable  Carotid Without bruit Without bruit  Aorta Not palpable N/A  Femoral Palpable Palpable  Popliteal Not palpable Not palpable  PT Not palpable Not palpable  DP Palpable Palpable   Gastrointestinal: soft, NTND, -G/R, - HSM, - masses  Musculoskeletal: Extremities without ischemic changes.  Tenderness to touch of feet bilaterally  Neurologic: CN 2-12 intact.  Pain and light touch intact in extremities. 4/5 left grip strength. 5/5 on right.   Psychiatric: Judgment intact, Mood & affect appropriate for pt's clinical situation  Dermatologic: See  M/S exam for extremity exam, no rashes otherwise noted, no pedal edema  Lymph : No inguinal lymphadenopathy  Non-Invasive Vascular Imaging  BLE Venous Insufficiency Duplex (Date: 04/18/2014):   RLE: negative DVT and SVT, negative GSV reflux, negative deep venous reflux  LLE: negativeDVT and SVT, negativeGSV reflux, popliteal deep venous reflux  Outside Studies/Documentation Reviewed notes from Dr. Wynn Banker office.   Medical Decision Making  [name] is a 48 y.o. female who presents with: bilateral lower extremity swelling and paresthesias.    Based on the patient's history and examination, I do not believe her pain and swelling is of vascular etiology. She has palpable lower extremity pulses. Her venous reflux studies today were negative for superficial and deep venous reflux. She has no evidence of deep vein thrombosis. I believe her pain is of neuropathic origin.   Discussed compression stockings for swelling.   Follow-up as needed.  Thank you for allowing [place] to participate in this patient's care.  Maris Berger, PA-C [facility] Office: [phone] Pager: [phone]  04/18/2014, 3:15 PM   History and exam findings as above. No evidence of arterial or venous pathology. No significant leg swelling. Pain potentially do to her pain syndromes or potentially peripheral neuropathy. No vascular intervention or followup necessary at this point.  Fabienne Bruns, MD [facility] Office: [phone] Pager: [phone]

## 2014-04-22 ENCOUNTER — Other Ambulatory Visit: Payer: Self-pay | Admitting: Internal Medicine

## 2014-04-25 ENCOUNTER — Encounter: Payer: Worker's Compensation | Attending: Registered Nurse | Admitting: Registered Nurse

## 2014-04-25 ENCOUNTER — Encounter: Payer: Self-pay | Admitting: Registered Nurse

## 2014-04-25 VITALS — BP 134/79 | HR 96 | Resp 14 | Wt 196.0 lb

## 2014-04-25 DIAGNOSIS — J45909 Unspecified asthma, uncomplicated: Secondary | ICD-10-CM | POA: Insufficient documentation

## 2014-04-25 DIAGNOSIS — Z8673 Personal history of transient ischemic attack (TIA), and cerebral infarction without residual deficits: Secondary | ICD-10-CM | POA: Insufficient documentation

## 2014-04-25 DIAGNOSIS — Z79899 Other long term (current) drug therapy: Secondary | ICD-10-CM | POA: Diagnosis not present

## 2014-04-25 DIAGNOSIS — IMO0001 Reserved for inherently not codable concepts without codable children: Secondary | ICD-10-CM | POA: Insufficient documentation

## 2014-04-25 DIAGNOSIS — I10 Essential (primary) hypertension: Secondary | ICD-10-CM | POA: Diagnosis not present

## 2014-04-25 DIAGNOSIS — K219 Gastro-esophageal reflux disease without esophagitis: Secondary | ICD-10-CM | POA: Diagnosis not present

## 2014-04-25 DIAGNOSIS — M25539 Pain in unspecified wrist: Secondary | ICD-10-CM | POA: Diagnosis not present

## 2014-04-25 DIAGNOSIS — Z5181 Encounter for therapeutic drug level monitoring: Secondary | ICD-10-CM

## 2014-04-25 DIAGNOSIS — G43909 Migraine, unspecified, not intractable, without status migrainosus: Secondary | ICD-10-CM | POA: Diagnosis not present

## 2014-04-25 DIAGNOSIS — M25579 Pain in unspecified ankle and joints of unspecified foot: Secondary | ICD-10-CM | POA: Diagnosis not present

## 2014-04-25 DIAGNOSIS — G9050 Complex regional pain syndrome I, unspecified: Secondary | ICD-10-CM | POA: Insufficient documentation

## 2014-04-25 DIAGNOSIS — G4730 Sleep apnea, unspecified: Secondary | ICD-10-CM | POA: Diagnosis not present

## 2014-04-25 MED ORDER — HYDROCODONE-ACETAMINOPHEN 7.5-325 MG PO TABS: ORAL_TABLET | ORAL | Status: DC

## 2014-04-25 MED ORDER — LIDOCAINE 5 % EX PTCH: MEDICATED_PATCH | CUTANEOUS | Status: DC

## 2014-04-25 NOTE — Progress Notes (Signed)
Subjective:    Patient ID: [name], female    DOB: 04-Oct-1966, 48 y.o.   MRN: [id number]  HPI: [name] is a 48 year old female who returns for follow up for chronic pain and medication refill. She says her pain is located in her head, left arm and bilateral feet. She has been having a headache for the last two days. She's on Topamax and has been compliant with medication. Also complaining of being diaphoretic blood pressure rechecked 135/76. She rates her pain 9. Her current exercise regime is pool therapy daily.    Pain Inventory Average Pain 10 Pain Right Now 9 My pain is constant, sharp, stabbing, tingling and aching  In the last 24 hours, has pain interfered with the following? General activity 8 Relation with others 8 Enjoyment of life 8 What TIME of day is your pain at its worst? varies Sleep (in general) Poor  Pain is worse with: walking, bending, sitting, inactivity, standing and some activites Pain improves with: rest, heat/ice, therapy/exercise, pacing activities, medication and TENS Relief from Meds: 7  Mobility walk without assistance ability to climb steps?  yes do you drive?  yes  Function disabled: date disabled . I need assistance with the following:  meal prep, household duties and shopping  Neuro/Psych weakness numbness tremor tingling trouble walking spasms depression anxiety  Prior Studies Any changes since last visit?  no  Physicians involved in your care Any changes since last visit?  no   Family History  Problem Relation Age of Onset  . Cancer Mother     lung  . Hypertension Mother   . Cancer Father     colon  . Hypertension Sister   . Cancer Sister   . Birth defects Maternal Grandmother     breast  . Birth defects Paternal Grandmother     uterine, stomach, lung  . Cancer Brother   . Hyperlipidemia Sister    History   Social History  . Marital Status: Married    Spouse Name: N/A    Number of Children:  2  . Years of Education: N/A   Occupational History  . Disabled due to RSD    Social History Main Topics  . Smoking status: Never Smoker   . Smokeless tobacco: Never Used  . Alcohol Use: Yes     Comment: 01/24/2013 "drink or 2 once or /twice/year, if that"  . Drug Use: No  . Sexual Activity: Not Currently   Other Topics Concern  . None   Social History Narrative  . None   Past Surgical History  Procedure Laterality Date  . Oophorectomy Right 2009  . Laparoscopic incisional / umbilical / ventral hernia repair  01/24/2013    IHR w/mesh/notes  . Hernia repair    . Tonsillectomy  1990's  . Nasal septum surgery  1980's?  . Appendectomy  ~ 06/2007  . Bladder repair  ~ 06/2007    "same day after bladder lift" (01/24/2013)  . Bladder suspension  ~ 06/2007  . Vaginal hysterectomy  ` 06/2007  . Diagnostic laparoscopy  1990's & ~ 2000    "I've had a couple; for endometrosis" (01/24/2013)  . Incisional hernia repair N/A 01/24/2013    Procedure: LAPAROSCOPIC INCISIONAL HERNIA;  Surgeon: Harl Bowie, MD;  Location: [facility];  Service: General;  Laterality: N/A;  . Insertion of mesh N/A 01/24/2013    Procedure: INSERTION OF MESH;  Surgeon: Harl Bowie, MD;  Location: [facility];  Service:  General;  Laterality: N/A;   Past Medical History  Diagnosis Date  . Anxiety   . Asthma   . History of colonic polyps   . GERD (gastroesophageal reflux disease)   . Hypertension   . Allergy   . Hyperlipidemia   . ASCVD (arteriosclerotic cardiovascular disease)   . Obesity (BMI 30.0-34.9)   . Sleep apnea     NO MACHINE RECOMMENDED  . TIA (transient ischemic attack) ~ 2009  . Migraines   . Daily headache     "depends on the season" (01/24/2013)  . Arthritis     "just the norm" (01/24/2013)  . Fibromyalgia   . RSD (reflex sympathetic dystrophy)    BP 134/79  Pulse 96  Resp 14  Wt 196 lb (88.905 kg)  SpO2 100%  Opioid Risk Score:   Fall Risk Score: Moderate Fall Risk (6-13 points) (previously  educated and declined handout for fall prevention)  Review of Systems  Musculoskeletal: Positive for gait problem.       Spasms  Neurological: Positive for tremors and weakness.       Tingling  Psychiatric/Behavioral: Positive for dysphoric mood. The patient is nervous/anxious.   All other systems reviewed and are negative.      Objective:   Physical Exam  Nursing note and vitals reviewed. Constitutional: She is oriented to person, place, and time. She appears well-developed and well-nourished.  HENT:  Head: Normocephalic and atraumatic.  Neck: Normal range of motion. Neck supple.  Cardiovascular: Normal rate, regular rhythm and normal heart sounds.   Pulmonary/Chest: Effort normal and breath sounds normal.  Musculoskeletal:  Normal Muscle Bulk and Muscle Testing Reveals: Upper Extremities: Right Full ROM and Muscle Strength 5/5. Left Arm with Decreased ROM 120 Degrees and Muscle Strength 4/5. Back without spinal or paraspinal tenderness Lower Extremities: Full ROM and Muscle Strength 5/5 Bilateral Flexion Produces pain into feet. Arises from chair with ease Antalgic gait  Neurological: She is alert and oriented to person, place, and time.  Skin: Skin is warm and dry.  Psychiatric: She has a normal mood and affect.          Assessment & Plan:  1. Complex Regional Pain Syndrome Type 1  Refilled: Hydro-codone 7.5/325mg one tablet at bedtime #30. Tramadol 100 mg ER daily #30 and Tramadol 50 mg one tablet by mouth TID #90.  2. Depression: Cymbalta has been approved and she will continue current dose.   20 minutes of face to face patient care time was spent during this visit. All questions were encouraged and answered.   F/U in 1 month

## 2014-05-06 ENCOUNTER — Encounter: Payer: Self-pay | Admitting: Obstetrics & Gynecology

## 2014-05-06 ENCOUNTER — Ambulatory Visit (INDEPENDENT_AMBULATORY_CARE_PROVIDER_SITE_OTHER): Payer: BC Managed Care – PPO | Admitting: Obstetrics & Gynecology

## 2014-05-06 VITALS — BP 121/89 | HR 101 | Ht 68.0 in | Wt 195.0 lb

## 2014-05-06 DIAGNOSIS — Z01419 Encounter for gynecological examination (general) (routine) without abnormal findings: Secondary | ICD-10-CM

## 2014-05-06 MED ORDER — VITAMIN E 180 MG (400 UNIT) PO CAPS: 400.0000 [IU] | ORAL_CAPSULE | Freq: Every day | ORAL | Status: DC

## 2014-05-06 MED ORDER — CALCIUM CARBONATE 600 MG PO TABS: 600.0000 mg | ORAL_TABLET | Freq: Two times a day (BID) | ORAL | Status: DC

## 2014-05-07 ENCOUNTER — Other Ambulatory Visit: Payer: Self-pay | Admitting: Physical Medicine & Rehabilitation

## 2014-05-08 NOTE — Progress Notes (Signed)
  Subjective:    [name] is a 48 y.o. female who presents for an annual exam. The patient has no complaints about feeling frustrated wither parenting her tween children.  I suggesting family counseling might help the home situation. The patient is sexually active. GYN screening history: last pap: no longer needs pap smear since hysterectomy was doen for benign conditions.  Pt is due for f/u of Birads 3 mammogram this month. The patient wears seatbelts: yes. The patient participates in regular exercise: yes. Has the patient ever been transfused or tattooed?: not asked. The patient reports that there is not domestic violence in her life. Pt has hot flashes and flushes but not as severe in past years.  Does not desire HRT.  Menstrual History: OB History   Grav Para Term Preterm Abortions TAB SAB Ect Mult Living                 No LMP recorded. Patient has had a hysterectomy.    The following portions of the patient's history were reviewed and updated as appropriate: allergies, current medications, past family history, past medical history, past social history, past surgical history and problem list.  Review of Systems Pertinent items are noted in HPI.    Objective:      Filed Vitals:   05/06/14 1540  BP: 121/89  Pulse: 101  Height: 5' 8"  (1.727 m)  Weight: 195 lb (88.451 kg)   Vitals:  WNL General appearance: alert, cooperative and no distress Head: Normocephalic, without obvious abnormality, atraumatic Eyes: negative Throat: lips, mucosa, and tongue normal; teeth and gums normal Lungs: clear to auscultation bilaterally Breasts: normal appearance, no masses or tenderness, No nipple retraction or dimpling, No nipple discharge or bleeding Heart: regular rate and rhythm Abdomen: soft, non-tender; bowel sounds normal; no masses,  no organomegaly  Pelvic:  External Genitalia:  Tanner V, no lesion Urethra:  No prolapse Vagina:  Pink, normal rugae, no blood or discharge,   Vault intact Cervix:  Surgically absent Uterus:  Surgically absent Adnexa:  Normal, no masses, non tender Rectovaginal Septum:  Non tender, no masses  Extremities: no edema, redness or tenderness in the calves or thighs Skin: no lesions or rash Lymph nodes: Axillary adenopathy: none     .    Assessment:    Healthy female exam.    Plan:   No pap smear Needs f/u mammogram Family counseling. calcium and vitamin D for protection against Ostoeporosis

## 2014-05-09 ENCOUNTER — Other Ambulatory Visit: Payer: Self-pay | Admitting: Obstetrics & Gynecology

## 2014-05-09 DIAGNOSIS — N63 Unspecified lump in unspecified breast: Secondary | ICD-10-CM

## 2014-05-13 ENCOUNTER — Other Ambulatory Visit: Payer: Self-pay

## 2014-05-13 ENCOUNTER — Other Ambulatory Visit: Payer: Self-pay | Admitting: Obstetrics & Gynecology

## 2014-05-13 DIAGNOSIS — N63 Unspecified lump in unspecified breast: Secondary | ICD-10-CM

## 2014-05-15 ENCOUNTER — Encounter (INDEPENDENT_AMBULATORY_CARE_PROVIDER_SITE_OTHER): Payer: Self-pay

## 2014-05-15 ENCOUNTER — Ambulatory Visit
Admission: RE | Admit: 2014-05-15 | Discharge: 2014-05-15 | Disposition: A | Discharge status: Home / Self Care | Payer: BC Managed Care – PPO | Source: Ambulatory Visit | Attending: Obstetrics & Gynecology | Admitting: Obstetrics & Gynecology

## 2014-05-15 DIAGNOSIS — N63 Unspecified lump in unspecified breast: Secondary | ICD-10-CM

## 2014-05-16 ENCOUNTER — Encounter: Payer: Self-pay | Admitting: Physical Medicine & Rehabilitation

## 2014-05-16 ENCOUNTER — Telehealth: Payer: Self-pay

## 2014-05-16 ENCOUNTER — Ambulatory Visit (HOSPITAL_BASED_OUTPATIENT_CLINIC_OR_DEPARTMENT_OTHER): Payer: Worker's Compensation | Admitting: Physical Medicine & Rehabilitation

## 2014-05-16 ENCOUNTER — Encounter: Payer: Worker's Compensation | Attending: Physical Medicine & Rehabilitation

## 2014-05-16 VITALS — BP 131/72 | HR 77 | Resp 14 | Wt 194.0 lb

## 2014-05-16 DIAGNOSIS — M79609 Pain in unspecified limb: Secondary | ICD-10-CM | POA: Diagnosis not present

## 2014-05-16 DIAGNOSIS — I2510 Atherosclerotic heart disease of native coronary artery without angina pectoris: Secondary | ICD-10-CM | POA: Diagnosis not present

## 2014-05-16 DIAGNOSIS — Z8673 Personal history of transient ischemic attack (TIA), and cerebral infarction without residual deficits: Secondary | ICD-10-CM | POA: Diagnosis not present

## 2014-05-16 DIAGNOSIS — IMO0001 Reserved for inherently not codable concepts without codable children: Secondary | ICD-10-CM | POA: Insufficient documentation

## 2014-05-16 DIAGNOSIS — Z79899 Other long term (current) drug therapy: Secondary | ICD-10-CM | POA: Diagnosis not present

## 2014-05-16 DIAGNOSIS — I10 Essential (primary) hypertension: Secondary | ICD-10-CM | POA: Diagnosis not present

## 2014-05-16 DIAGNOSIS — G9050 Complex regional pain syndrome I, unspecified: Secondary | ICD-10-CM | POA: Diagnosis not present

## 2014-05-16 DIAGNOSIS — M25539 Pain in unspecified wrist: Secondary | ICD-10-CM | POA: Insufficient documentation

## 2014-05-16 DIAGNOSIS — E785 Hyperlipidemia, unspecified: Secondary | ICD-10-CM | POA: Insufficient documentation

## 2014-05-16 DIAGNOSIS — J45909 Unspecified asthma, uncomplicated: Secondary | ICD-10-CM | POA: Insufficient documentation

## 2014-05-16 DIAGNOSIS — M25529 Pain in unspecified elbow: Secondary | ICD-10-CM | POA: Diagnosis not present

## 2014-05-16 MED ORDER — TRAMADOL HCL ER 100 MG PO TB24: 100.0000 mg | ORAL_TABLET | Freq: Every day | ORAL | Status: DC

## 2014-05-16 MED ORDER — TOPIRAMATE 100 MG PO TABS: 100.0000 mg | ORAL_TABLET | Freq: Two times a day (BID) | ORAL | Status: DC

## 2014-05-16 MED ORDER — HYDROCODONE-ACETAMINOPHEN 7.5-325 MG PO TABS: ORAL_TABLET | ORAL | Status: DC

## 2014-05-16 MED ORDER — TIZANIDINE HCL 4 MG PO TABS: ORAL_TABLET | ORAL | Status: DC

## 2014-05-16 MED ORDER — TRAMADOL HCL 50 MG PO TABS: 50.0000 mg | ORAL_TABLET | Freq: Three times a day (TID) | ORAL | Status: DC

## 2014-05-16 MED ORDER — TOPIRAMATE 100 MG PO TABS: ORAL_TABLET | ORAL | Status: DC

## 2014-05-16 NOTE — Telephone Encounter (Signed)
Pharmacy called to get clarification on topamax directions.  Please clarify.

## 2014-05-16 NOTE — Telephone Encounter (Signed)
Topamax 100 mg, 1 per os q. a.m., 2 per os every afternoon #90 2 refill

## 2014-05-16 NOTE — Progress Notes (Signed)
Subjective:    Patient ID: [name], female    DOB: [birth year], 48 y.o.   MRN: [id number]  HPI Bilateral foot and ankle pain Left wrist pain  Wearing slippers today because regular shoes are uncomfortable on both feet. Feels like she is walking on fire No new injuries. Patient would like to discuss medication management and other treatment options.  Left foot worse than right foot.  Continues to take Cymbalta 90 mg per day Tramadol ER 100 mg each bedtime Tramadol 50 mg 3 times a day Has some sweating but no other signs of serotonin syndrome  Takes Motrin 600 mg as needed does not take this every day Hydrocodone 7.5 mg each bedtime Lidocaine 5% patch on 12 hours off 12 hours Zanaflex 52m 4 times a day Pain Inventory Average Pain 8 Pain Right Now 8 My pain is constant, sharp, burning, dull, stabbing, tingling and aching  In the last 24 hours, has pain interfered with the following? General activity 8 Relation with others 8 Enjoyment of life 8 What TIME of day is your pain at its worst? constant Sleep (in general) Poor  Pain is worse with: walking, bending, sitting, inactivity and standing Pain improves with: rest, heat/ice, therapy/exercise, pacing activities, medication and TENS Relief from Meds: 7  Mobility ability to climb steps?  yes do you drive?  yes  Function disabled: date disabled . I need assistance with the following:  household duties and shopping  Neuro/Psych weakness numbness tremor tingling trouble walking spasms dizziness confusion depression anxiety  Prior Studies Any changes since last visit?  no  Physicians involved in your care Any changes since last visit?  no   Family History  Problem Relation Age of Onset  . Cancer Mother     lung  . Hypertension Mother   . Cancer Father     colon  . Hypertension Sister   . Cancer Sister   . Birth defects Maternal Grandmother     breast  . Birth defects Paternal Grandmother    uterine, stomach, lung  . Cancer Brother   . Hyperlipidemia Sister    History   Social History  . Marital Status: Married    Spouse Name: N/A    Number of Children: 2  . Years of Education: N/A   Occupational History  . Disabled due to RSD    Social History Main Topics  . Smoking status: Never Smoker   . Smokeless tobacco: Never Used  . Alcohol Use: Yes     Comment: 01/24/2013 "drink or 2 once or /twice/year, if that"  . Drug Use: No  . Sexual Activity: Not Currently   Other Topics Concern  . None   Social History Narrative  . None   Past Surgical History  Procedure Laterality Date  . Oophorectomy Right 2009  . Laparoscopic incisional / umbilical / ventral hernia repair  01/24/2013    IHR w/mesh/notes  . Hernia repair    . Tonsillectomy  1990's  . Nasal septum surgery  1980's?  . Appendectomy  ~ 06/2007  . Bladder repair  ~ 06/2007    "same day after bladder lift" (01/24/2013)  . Bladder suspension  ~ 06/2007  . Vaginal hysterectomy  ` 06/2007  . Diagnostic laparoscopy  1990's & ~ 2000    "I've had a couple; for endometrosis" (01/24/2013)  . Incisional hernia repair N/A 01/24/2013    Procedure: LAPAROSCOPIC INCISIONAL HERNIA;  Surgeon: DHarl Bowie MD;  [facility]  Service: General;  Laterality: N/A;  . Insertion of mesh N/A 01/24/2013    Procedure: INSERTION OF MESH;  Surgeon: Harl Bowie, MD;  Location: [facility];  Service: General;  Laterality: N/A;   Past Medical History  Diagnosis Date  . Anxiety   . Asthma   . History of colonic polyps   . GERD (gastroesophageal reflux disease)   . Hypertension   . Allergy   . Hyperlipidemia   . ASCVD (arteriosclerotic cardiovascular disease)   . Obesity (BMI 30.0-34.9)   . Sleep apnea     NO MACHINE RECOMMENDED  . TIA (transient ischemic attack) ~ 2009  . Migraines   . Daily headache     "depends on the season" (01/24/2013)  . Arthritis     "just the norm" (01/24/2013)  . Fibromyalgia   . RSD (reflex sympathetic  dystrophy)    BP 131/72  Pulse 77  Resp 14  Wt 194 lb (87.998 kg)  SpO2 97%  Opioid Risk Score:   Fall Risk Score: Low Fall Risk (0-5 points) (patient educated handout declined)   Review of Systems  Musculoskeletal: Positive for back pain, gait problem and myalgias.  Neurological: Positive for dizziness, tremors, weakness and numbness.  Psychiatric/Behavioral: Positive for confusion and dysphoric mood. The patient is nervous/anxious.   All other systems reviewed and are negative.      Objective:   Physical Exam  Hypersensitivity to touch bilateral feet as well as left hand. There's hyper hydrosis in the left hand as well as mild erythema at the left hand and wrist Bilateral feet without evidence of swelling Normal range of motion in the feet. Ambulation without toe drag or need stability Mood and affect are appropriate Sensation numbness to light touch left foot non-dermatomal distribution     Assessment & Plan:  #1. Reflex sympathetic dystrophy left upper extremity and bilateral lower extremities. The wrist sprain with the first injury. Had subsequent left ankle sprain couple years ago. Nerve conduction studies did not reveal any other diagnosis that would cause similar symptoms. Increased Topamax one 100 mg every morning and 200 mg every afternoon Continue hydrocodone some 0.5 to age 32. Continue tramadol 100 mg each bedtime Continue tramadol 50 mg 3 times a day Continue Motrin 600 mg per os when necessary Continue Cymbalta 90 mg daily Continue Zanaflex 4 mg 3 times a day  Given the worsening of her lower extremity pain that is affecting function will trial left tibial nerve block With marked pain and Celestone  Of note is that the patient was on high dose morphine before she started in this clinic. We are able to switch her off of strong narcotics onto her current regimen. Hope to further simplify regimen, may be able to change tramadol ER to 200 mg and discontinue  tramadol 50 mg

## 2014-05-16 NOTE — Patient Instructions (Signed)
Please get supplier regarding TENS unit,   May try tibial nerve block for pain control

## 2014-05-16 NOTE — Telephone Encounter (Signed)
New rx sent to pharmacy with directions for topamax as 1 tablet po qam and 2 po every afternoon.

## 2014-05-22 ENCOUNTER — Telehealth: Payer: Self-pay | Admitting: *Deleted

## 2014-05-22 NOTE — Telephone Encounter (Signed)
Left message for patient to call me back regarding her test results.  Dr. Penne LashLeggett would like for us to refer her to Dr. Harden MoMatt Wakefield if she still would like to see a general surgeon regarding the cysts in her breast that were seen in her most recent mammogram and ultrasound.

## 2014-05-22 NOTE — Telephone Encounter (Signed)
Message copied by Barbara CowerNOGUES, Willistine Ferrall L on Wed May 22, 2014  4:00 PM ------      Message from: Lesly DukesLEGGETT, KELLY H      Created: Thu May 16, 2014 11:49 AM       Does patient still want to a talk to a general surgery about her breasts after these us results? ------

## 2014-06-01 ENCOUNTER — Other Ambulatory Visit: Payer: Self-pay | Admitting: Internal Medicine

## 2014-06-04 ENCOUNTER — Telehealth: Payer: Self-pay | Admitting: *Deleted

## 2014-06-04 DIAGNOSIS — N6320 Unspecified lump in the left breast, unspecified quadrant: Secondary | ICD-10-CM

## 2014-06-04 NOTE — Telephone Encounter (Signed)
Patient called back and would like a referral to Dr.  Dwain SarnaWakefield to discuss having the mass in her left  breast removed.  I have put in the referral and gave the physicians information to the patient as well.

## 2014-06-27 ENCOUNTER — Telehealth: Payer: Self-pay

## 2014-06-27 MED ORDER — HYDROCODONE-ACETAMINOPHEN 7.5-325 MG PO TABS: ORAL_TABLET | ORAL | Status: DC

## 2014-06-27 NOTE — Telephone Encounter (Signed)
Patient is requesting a refill on Hydrocodone. Patient will be out of medication before her appt on 9/24. RX printed for Dr. Wynn Banker to sign. Will contact patient when ready for pickup.

## 2014-06-28 ENCOUNTER — Other Ambulatory Visit: Payer: Self-pay | Admitting: Physical Medicine & Rehabilitation

## 2014-06-28 NOTE — Telephone Encounter (Signed)
Contacted patient to inform her the Hydrocodone RX will be ready for pickup this afternoon around 2:30 pm.

## 2014-07-08 ENCOUNTER — Ambulatory Visit (INDEPENDENT_AMBULATORY_CARE_PROVIDER_SITE_OTHER): Payer: BC Managed Care – PPO | Admitting: General Surgery

## 2014-07-08 ENCOUNTER — Telehealth (INDEPENDENT_AMBULATORY_CARE_PROVIDER_SITE_OTHER): Payer: Self-pay

## 2014-07-08 ENCOUNTER — Other Ambulatory Visit (INDEPENDENT_AMBULATORY_CARE_PROVIDER_SITE_OTHER): Payer: Self-pay | Admitting: General Surgery

## 2014-07-08 DIAGNOSIS — N6320 Unspecified lump in the left breast, unspecified quadrant: Secondary | ICD-10-CM

## 2014-07-08 NOTE — Telephone Encounter (Signed)
Pt seen in office by Dr Dwain Sarna on 9/21 and placed order in epic for pt to get scheduled for u/s guided bx [facility]. The pt will need f/u with Dr Dwain Sarna after the results are back.

## 2014-07-11 ENCOUNTER — Ambulatory Visit (HOSPITAL_BASED_OUTPATIENT_CLINIC_OR_DEPARTMENT_OTHER): Payer: Worker's Compensation | Admitting: Physical Medicine & Rehabilitation

## 2014-07-11 ENCOUNTER — Encounter: Payer: Worker's Compensation | Attending: Physical Medicine & Rehabilitation

## 2014-07-11 ENCOUNTER — Encounter: Payer: Self-pay | Admitting: Physical Medicine & Rehabilitation

## 2014-07-11 VITALS — BP 130/82 | HR 96 | Resp 14 | Wt 190.8 lb

## 2014-07-11 DIAGNOSIS — Z8673 Personal history of transient ischemic attack (TIA), and cerebral infarction without residual deficits: Secondary | ICD-10-CM | POA: Diagnosis not present

## 2014-07-11 DIAGNOSIS — Z79899 Other long term (current) drug therapy: Secondary | ICD-10-CM | POA: Diagnosis not present

## 2014-07-11 DIAGNOSIS — M25529 Pain in unspecified elbow: Secondary | ICD-10-CM | POA: Diagnosis not present

## 2014-07-11 DIAGNOSIS — E785 Hyperlipidemia, unspecified: Secondary | ICD-10-CM | POA: Diagnosis not present

## 2014-07-11 DIAGNOSIS — I10 Essential (primary) hypertension: Secondary | ICD-10-CM | POA: Insufficient documentation

## 2014-07-11 DIAGNOSIS — IMO0001 Reserved for inherently not codable concepts without codable children: Secondary | ICD-10-CM | POA: Insufficient documentation

## 2014-07-11 DIAGNOSIS — M79609 Pain in unspecified limb: Secondary | ICD-10-CM | POA: Diagnosis not present

## 2014-07-11 DIAGNOSIS — G90529 Complex regional pain syndrome I of unspecified lower limb: Secondary | ICD-10-CM | POA: Diagnosis not present

## 2014-07-11 DIAGNOSIS — M25539 Pain in unspecified wrist: Secondary | ICD-10-CM | POA: Diagnosis not present

## 2014-07-11 DIAGNOSIS — G9050 Complex regional pain syndrome I, unspecified: Secondary | ICD-10-CM | POA: Diagnosis present

## 2014-07-11 DIAGNOSIS — I2510 Atherosclerotic heart disease of native coronary artery without angina pectoris: Secondary | ICD-10-CM | POA: Diagnosis not present

## 2014-07-11 DIAGNOSIS — J45909 Unspecified asthma, uncomplicated: Secondary | ICD-10-CM | POA: Insufficient documentation

## 2014-07-11 DIAGNOSIS — G90522 Complex regional pain syndrome I of left lower limb: Secondary | ICD-10-CM

## 2014-07-11 MED ORDER — HYDROCODONE-ACETAMINOPHEN 7.5-325 MG PO TABS: ORAL_TABLET | ORAL | Status: DC

## 2014-07-11 NOTE — Patient Instructions (Signed)
This injection may last for days months or weeks. It may cause bottom of the foot numbness. If it is helpful on the left side will do right side injection.

## 2014-07-11 NOTE — Progress Notes (Signed)
Nerve block of the Left tibial nerve  Indication: RSD left lower extremity only partially responsive to medication management and other conservative care  Informed consent was obtained after describing the risks and benefits of the procedure with the patient this includes bleeding bruising and infection as well as medication side effects. The patient elected to proceed and has given written consent. Patient placed in a prone position on the exam table. External DC stimulation was applied to the popliteal space using a nerve stimulator. Plantar flexion twitch was obtained. The popliteal region was prepped with Betadine and then entered with a 22-gauge 40 mm needle electrode under electrical stimulation guidance. Plantar flexion which was obtained and confirmed. Then 4 cc of .25% marcaine and 1 cc celestone injected. The patient tolerated procedure well. Post procedure instructions and followup visit were given.

## 2014-07-12 ENCOUNTER — Other Ambulatory Visit (INDEPENDENT_AMBULATORY_CARE_PROVIDER_SITE_OTHER): Payer: Self-pay | Admitting: General Surgery

## 2014-07-12 ENCOUNTER — Other Ambulatory Visit: Payer: Self-pay

## 2014-07-12 DIAGNOSIS — N6320 Unspecified lump in the left breast, unspecified quadrant: Secondary | ICD-10-CM

## 2014-07-16 ENCOUNTER — Other Ambulatory Visit: Payer: Self-pay

## 2014-07-17 ENCOUNTER — Other Ambulatory Visit (INDEPENDENT_AMBULATORY_CARE_PROVIDER_SITE_OTHER): Payer: Self-pay | Admitting: General Surgery

## 2014-07-17 ENCOUNTER — Ambulatory Visit
Admission: RE | Admit: 2014-07-17 | Discharge: 2014-07-17 | Disposition: A | Discharge status: Home / Self Care | Payer: BC Managed Care – PPO | Source: Ambulatory Visit | Attending: General Surgery | Admitting: General Surgery

## 2014-07-17 DIAGNOSIS — N6320 Unspecified lump in the left breast, unspecified quadrant: Secondary | ICD-10-CM

## 2014-07-28 ENCOUNTER — Other Ambulatory Visit: Payer: Self-pay | Admitting: Internal Medicine

## 2014-07-28 ENCOUNTER — Other Ambulatory Visit: Payer: Self-pay | Admitting: Physical Medicine & Rehabilitation

## 2014-07-29 NOTE — Telephone Encounter (Signed)
Electronic refill request, Dr. Alphonsus SiasLetvak out of office please advise

## 2014-07-31 ENCOUNTER — Other Ambulatory Visit: Payer: Self-pay | Admitting: *Deleted

## 2014-07-31 MED ORDER — LIDOCAINE 5 % EX PTCH: MEDICATED_PATCH | CUTANEOUS | Status: DC

## 2014-07-31 NOTE — Telephone Encounter (Signed)
recvd refill request for Lidocaine %5 patches. OK to refill.  This is a WC case... Should be approved by case worker Wonda Chenghristine Metao [phone]

## 2014-08-22 ENCOUNTER — Encounter: Payer: Self-pay | Admitting: Physical Medicine & Rehabilitation

## 2014-08-22 ENCOUNTER — Encounter: Payer: Worker's Compensation | Attending: Physical Medicine & Rehabilitation

## 2014-08-22 ENCOUNTER — Other Ambulatory Visit: Payer: Self-pay | Admitting: Physical Medicine & Rehabilitation

## 2014-08-22 ENCOUNTER — Ambulatory Visit (HOSPITAL_BASED_OUTPATIENT_CLINIC_OR_DEPARTMENT_OTHER): Payer: Worker's Compensation | Admitting: Physical Medicine & Rehabilitation

## 2014-08-22 VITALS — BP 132/88 | HR 88 | Resp 14 | Ht 67.0 in | Wt 190.0 lb

## 2014-08-22 DIAGNOSIS — Z5181 Encounter for therapeutic drug level monitoring: Secondary | ICD-10-CM | POA: Diagnosis not present

## 2014-08-22 DIAGNOSIS — G90523 Complex regional pain syndrome I of lower limb, bilateral: Secondary | ICD-10-CM | POA: Insufficient documentation

## 2014-08-22 DIAGNOSIS — Z79899 Other long term (current) drug therapy: Secondary | ICD-10-CM | POA: Insufficient documentation

## 2014-08-22 DIAGNOSIS — G894 Chronic pain syndrome: Secondary | ICD-10-CM | POA: Insufficient documentation

## 2014-08-22 MED ORDER — TRAMADOL HCL ER 100 MG PO TB24: 100.0000 mg | ORAL_TABLET | Freq: Every day | ORAL | Status: DC

## 2014-08-22 MED ORDER — HYDROCODONE-ACETAMINOPHEN 7.5-325 MG PO TABS: ORAL_TABLET | ORAL | Status: DC

## 2014-08-22 MED ORDER — TRAMADOL HCL 50 MG PO TABS: 50.0000 mg | ORAL_TABLET | Freq: Three times a day (TID) | ORAL | Status: DC

## 2014-08-22 NOTE — Patient Instructions (Signed)
Tibial nerve block with Marcaine and Celestone. May consider phenol injection for longer duration of effect. We'll discuss at next visit

## 2014-08-27 ENCOUNTER — Other Ambulatory Visit: Payer: Self-pay | Admitting: *Deleted

## 2014-08-27 ENCOUNTER — Other Ambulatory Visit: Payer: Self-pay | Admitting: Internal Medicine

## 2014-08-27 MED ORDER — TOPIRAMATE 200 MG PO TABS: 200.0000 mg | ORAL_TABLET | Freq: Every day | ORAL | Status: DC

## 2014-08-27 NOTE — Telephone Encounter (Signed)
Called pt to ask what dose of Topiramate is she taking 100mg  or 200mg , per pt this should go to Dr. Wynn BankerKirsteins.   This rx is also asking for a prior auth for the 200mg  dose. Will forward to Dr. Wynn BankerKirsteins

## 2014-09-03 ENCOUNTER — Other Ambulatory Visit: Payer: Self-pay | Admitting: Physical Medicine & Rehabilitation

## 2014-09-04 ENCOUNTER — Telehealth: Payer: Self-pay | Admitting: *Deleted

## 2014-09-04 NOTE — Telephone Encounter (Signed)
Insurance carrier for workers comp has changed, trying to get Rx filled pending authorization, and the insurance carrier is claiming that Dr. Wynn BankerKirsteins denied her Cymbalta Rx

## 2014-09-04 NOTE — Telephone Encounter (Signed)
No change in Cymbalta cont 74m per day

## 2014-09-05 MED ORDER — CYMBALTA 30 MG PO CPEP: 90.0000 mg | ORAL_CAPSULE | Freq: Every day | ORAL | Status: DC

## 2014-09-05 NOTE — Telephone Encounter (Signed)
I spoke with [name] and she said that the pharmacy said they had a denial on the cymbalta.  They confirmed this (?) so I have reordered the cymbalta 30 mg iii day #270 for 90 day supply.  I also called pharmacy made sure they know that since she is [employer] if something needs prior auth it needs to be sent directly to [employer] and not our office.

## 2014-09-05 NOTE — Telephone Encounter (Signed)
Left message for Madylin to call back to discuss.  It looks like cymbalta 30 mg iii daily was ordered and 90 day supply sent to [facility] 07/29/14

## 2014-09-09 ENCOUNTER — Other Ambulatory Visit: Payer: Self-pay | Admitting: Physical Medicine & Rehabilitation

## 2014-09-09 NOTE — Telephone Encounter (Signed)
Refill request on the tizanidine.  It was last prescribed at the 05/16/14 visit with one refill but note says 4 mg tid and rx says qid.  Please verify what does and #disp you want her to have.

## 2014-09-22 ENCOUNTER — Other Ambulatory Visit: Payer: Self-pay | Admitting: Physical Medicine & Rehabilitation

## 2014-09-26 ENCOUNTER — Encounter: Payer: Self-pay | Admitting: Physical Medicine & Rehabilitation

## 2014-09-26 ENCOUNTER — Ambulatory Visit (HOSPITAL_BASED_OUTPATIENT_CLINIC_OR_DEPARTMENT_OTHER): Payer: Worker's Compensation | Admitting: Physical Medicine & Rehabilitation

## 2014-09-26 ENCOUNTER — Encounter: Payer: Worker's Compensation | Attending: Physical Medicine & Rehabilitation

## 2014-09-26 VITALS — BP 127/80 | HR 88 | Resp 14 | Ht 68.0 in | Wt 192.0 lb

## 2014-09-26 DIAGNOSIS — G9050 Complex regional pain syndrome I, unspecified: Secondary | ICD-10-CM | POA: Diagnosis not present

## 2014-09-26 DIAGNOSIS — G90522 Complex regional pain syndrome I of left lower limb: Secondary | ICD-10-CM

## 2014-09-26 DIAGNOSIS — G90529 Complex regional pain syndrome I of unspecified lower limb: Secondary | ICD-10-CM | POA: Insufficient documentation

## 2014-09-26 MED ORDER — HYDROCODONE-ACETAMINOPHEN 7.5-325 MG PO TABS: ORAL_TABLET | ORAL | Status: DC

## 2014-09-26 NOTE — Progress Notes (Signed)
Nerve block of the Left tibial nerve  Indication: RSD left lower extremity only partially responsive to medication management and other conservative care  Informed consent was obtained after describing the risks and benefits of the procedure with the patient this includes bleeding bruising and infection as well as medication side effects. The patient elected to proceed and has given written consent. Patient placed in a prone position on the exam table. External DC stimulation was applied to the popliteal space using a nerve stimulator. Plantar flexion twitch was obtained. The popliteal region was prepped with Betadine and then entered with a 22-gauge 40 mm needle electrode under electrical stimulation guidance. Plantar flexion which was obtained and confirmed. Then 4 cc of .25% marcaine and 1 cc celestone injected. The patient tolerated procedure well. Post procedure instructions and followup visit were given.

## 2014-09-26 NOTE — Patient Instructions (Signed)
Nerve block left tibial May cause Left foot numbness Some temporary calf muscle weakness  No restrictions  See Zella Ball in 1 mo

## 2014-10-01 ENCOUNTER — Encounter: Payer: Self-pay | Admitting: *Deleted

## 2014-10-06 ENCOUNTER — Other Ambulatory Visit: Payer: Self-pay | Admitting: Physical Medicine & Rehabilitation

## 2014-10-25 ENCOUNTER — Encounter: Payer: Self-pay | Admitting: Registered Nurse

## 2014-10-25 ENCOUNTER — Other Ambulatory Visit: Payer: Self-pay | Admitting: Physical Medicine & Rehabilitation

## 2014-10-25 ENCOUNTER — Encounter: Payer: Worker's Compensation | Attending: Registered Nurse | Admitting: Registered Nurse

## 2014-10-25 VITALS — BP 120/66 | HR 72 | Resp 14

## 2014-10-25 DIAGNOSIS — G8929 Other chronic pain: Secondary | ICD-10-CM | POA: Diagnosis not present

## 2014-10-25 DIAGNOSIS — F329 Major depressive disorder, single episode, unspecified: Secondary | ICD-10-CM | POA: Diagnosis not present

## 2014-10-25 DIAGNOSIS — G90522 Complex regional pain syndrome I of left lower limb: Secondary | ICD-10-CM

## 2014-10-25 DIAGNOSIS — Z760 Encounter for issue of repeat prescription: Secondary | ICD-10-CM | POA: Diagnosis not present

## 2014-10-25 DIAGNOSIS — Z79899 Other long term (current) drug therapy: Secondary | ICD-10-CM

## 2014-10-25 DIAGNOSIS — G9050 Complex regional pain syndrome I, unspecified: Secondary | ICD-10-CM | POA: Diagnosis not present

## 2014-10-25 DIAGNOSIS — Z5181 Encounter for therapeutic drug level monitoring: Secondary | ICD-10-CM

## 2014-10-25 DIAGNOSIS — G894 Chronic pain syndrome: Secondary | ICD-10-CM

## 2014-10-25 DIAGNOSIS — R52 Pain, unspecified: Secondary | ICD-10-CM | POA: Insufficient documentation

## 2014-10-25 MED ORDER — HYDROCODONE-ACETAMINOPHEN 7.5-325 MG PO TABS: ORAL_TABLET | ORAL | Status: DC

## 2014-10-25 NOTE — Progress Notes (Signed)
Subjective:    Patient ID: [name], female    DOB: 07/22/1966, 49 y.o.   MRN: [id number]  HPI: [name] is a 49 year old female who returns for follow up for chronic pain and medication refill. She's complaining of generalized pain all over. She woke up on Monday 10/21/14 not feeling well with cold  Symptoms. She admits to being depressed regarding her medical condition and increase intensity of pain, she denies suicidal thoughts or a plan. She was given a pamphlet regarding the [facility]. She rates her pain 10. Her current exercise regime is performing stretching exercises and walking for short distances. S/P nerve block of left tibial nerve relief noted for three days.  Pain Inventory Average Pain 7 Pain Right Now 10 My pain is sharp, burning, stabbing, tingling and aching  In the last 24 hours, has pain interfered with the following? General activity no selection Relation with others no selection Enjoyment of life no selection What TIME of day is your pain at its worst? all Sleep (in general) Poor  Pain is worse with: walking, bending, sitting, inactivity and standing Pain improves with: rest, heat/ice, therapy/exercise, pacing activities, medication, TENS and injections Relief from Meds: 6  Mobility ability to climb steps?  yes do you drive?  yes  Function disabled: date disabled .  Neuro/Psych numbness tremor tingling trouble walking spasms dizziness depression anxiety  Prior Studies Any changes since last visit?  no  Physicians involved in your care Any changes since last visit?  no   Family History  Problem Relation Age of Onset  . Cancer Mother     lung  . Hypertension Mother   . Cancer Father     colon  . Hypertension Sister   . Cancer Sister   . Birth defects Maternal Grandmother     breast  . Birth defects Paternal Grandmother     uterine, stomach, lung  . Cancer Brother   . Hyperlipidemia Sister    History   Social  History  . Marital Status: Married    Spouse Name: N/A    Number of Children: 2  . Years of Education: N/A   Occupational History  . Disabled due to RSD    Social History Main Topics  . Smoking status: Never Smoker   . Smokeless tobacco: Never Used  . Alcohol Use: Yes     Comment: 01/24/2013 "drink or 2 once or /twice/year, if that"  . Drug Use: No  . Sexual Activity: Not Currently   Other Topics Concern  . None   Social History Narrative   Past Surgical History  Procedure Laterality Date  . Oophorectomy Right 2009  . Laparoscopic incisional / umbilical / ventral hernia repair  01/24/2013    IHR w/mesh/notes  . Hernia repair    . Tonsillectomy  1990's  . Nasal septum surgery  1980's?  . Appendectomy  ~ 06/2007  . Bladder repair  ~ 06/2007    "same day after bladder lift" (01/24/2013)  . Bladder suspension  ~ 06/2007  . Vaginal hysterectomy  ` 06/2007  . Diagnostic laparoscopy  1990's & ~ 2000    "I've had a couple; for endometrosis" (01/24/2013)  . Incisional hernia repair N/A 01/24/2013    Procedure: LAPAROSCOPIC INCISIONAL HERNIA;  Surgeon: Harl Bowie, MD;  Location: [facility];  Service: General;  Laterality: N/A;  . Insertion of mesh N/A 01/24/2013    Procedure: INSERTION OF MESH;  Surgeon: Harl Bowie, MD;  Location: [facility];  Service: General;  Laterality: N/A;   Past Medical History  Diagnosis Date  . Anxiety   . Asthma   . History of colonic polyps   . GERD (gastroesophageal reflux disease)   . Hypertension   . Allergy   . Hyperlipidemia   . ASCVD (arteriosclerotic cardiovascular disease)   . Obesity (BMI 30.0-34.9)   . Sleep apnea     NO MACHINE RECOMMENDED  . TIA (transient ischemic attack) ~ 2009  . Migraines   . Daily headache     "depends on the season" (01/24/2013)  . Arthritis     "just the norm" (01/24/2013)  . Fibromyalgia   . RSD (reflex sympathetic dystrophy)    BP 120/66 mmHg  Pulse 72  Resp 14  SpO2 98%  Opioid Risk Score:   Fall Risk  Score: High Fall Risk (>13 points) Review of Systems  Neurological: Positive for dizziness, tremors and numbness.       Tingling Spasms   Psychiatric/Behavioral: Positive for dysphoric mood. The patient is nervous/anxious.   All other systems reviewed and are negative.      Objective:   Physical Exam  Constitutional: She is oriented to person, place, and time. She appears well-developed and well-nourished.  HENT:  Head: Normocephalic and atraumatic.  Neck: Normal range of motion. Neck supple.  Cardiovascular: Normal rate and regular rhythm.   Pulmonary/Chest: Effort normal and breath sounds normal.  Musculoskeletal:  Normal Muscle Bulk and Muscle Testing Reveals: Upper Extremities:Right: Full ROM and Muscle Strength 5/5 Left decreased ROM 100 Degrees Muscle strength 5/5 Lumbar Paraspinal Tenderness: L-3- L-5 Lower Extremities: Full ROM and Muscle strength 5/5 Bilateral Lower extremities Flexion Produces pain into feet. Arises from chair with ease Antalgic Gait    Neurological: She is alert and oriented to person, place, and time.  Skin: Skin is warm and dry.  Psychiatric: She has a normal mood and affect.  Nursing note and vitals reviewed.         Assessment & Plan:  1. Complex Regional Pain Syndrome Type 1  Refilled: Hydro-codone 7.5/325mg one tablet at bedtime #30. Tramadol 100 mg ER daily #30 and Tramadol 50 mg one tablet by mouth TID #90.  2. Depression: Continue Cymbalta and [facility] Pamphlet Given.  20 minutes of face to face patient care time was spent during this visit. All questions were encouraged and answered.   F/U in 1 month

## 2014-10-26 LAB — PMP ALCOHOL METABOLITE (ETG): Ethyl Glucuronide (EtG): NEGATIVE ng/mL

## 2014-10-31 LAB — OPIATES/OPIOIDS (LC/MS-MS)
Codeine Urine: NEGATIVE ng/mL (ref <50)
HYDROCODONE: 1002 ng/mL (ref <50)
Hydromorphone: 111 ng/mL (ref <50)
MORPHINE: NEGATIVE ng/mL (ref <50)
NORHYDROCODONE, UR: 884 ng/mL (ref <50)
NOROXYCODONE, UR: NEGATIVE ng/mL (ref <50)
Oxycodone, ur: NEGATIVE ng/mL (ref <50)
Oxymorphone: NEGATIVE ng/mL (ref <50)

## 2014-10-31 LAB — MEPERIDINE (GC/LC/MS), URINE
Meperidine (GC/LC/MS), ur confirm: NEGATIVE ng/mL (ref <100)
Normeperidine (GC/LC/MS), ur confirm: NEGATIVE ng/mL (ref <100)

## 2014-10-31 LAB — TRAMADOL, URINE
N-DESMETHYL-CIS-TRAMADOL: 4784 ng/mL (ref <100)
Tramadol, Urine: 39390 ng/mL (ref <100)

## 2014-11-01 LAB — PRESCRIPTION MONITORING PROFILE (SOLSTAS)
Amphetamine/Meth: NEGATIVE ng/mL
BARBITURATE SCREEN, URINE: NEGATIVE ng/mL
BUPRENORPHINE, URINE: NEGATIVE ng/mL
Benzodiazepine Screen, Urine: NEGATIVE ng/mL
COCAINE METABOLITES: NEGATIVE ng/mL
Cannabinoid Scrn, Ur: NEGATIVE ng/mL
Carisoprodol, Urine: NEGATIVE ng/mL
Creatinine, Urine: 200.11 mg/dL (ref >20.0)
ECSTASY: NEGATIVE ng/mL
Fentanyl, Ur: NEGATIVE ng/mL
Methadone Screen, Urine: NEGATIVE ng/mL
Nitrites, Initial: NEGATIVE ug/mL
OXYCODONE SCRN UR: NEGATIVE ng/mL
PROPOXYPHENE: NEGATIVE ng/mL
Tapentadol, urine: NEGATIVE ng/mL
Zolpidem, Urine: NEGATIVE ng/mL
pH, Initial: 4.9 pH (ref 4.5–8.9)

## 2014-11-11 NOTE — Progress Notes (Signed)
Urine drug screen for this encounter is consistent for prescribed medication 

## 2014-11-19 ENCOUNTER — Encounter: Payer: Self-pay | Admitting: Internal Medicine

## 2014-11-19 ENCOUNTER — Ambulatory Visit (INDEPENDENT_AMBULATORY_CARE_PROVIDER_SITE_OTHER): Payer: BLUE CROSS/BLUE SHIELD | Admitting: Internal Medicine

## 2014-11-19 ENCOUNTER — Ambulatory Visit (INDEPENDENT_AMBULATORY_CARE_PROVIDER_SITE_OTHER)
Admission: RE | Admit: 2014-11-19 | Discharge: 2014-11-19 | Disposition: A | Discharge status: Home / Self Care | Payer: BLUE CROSS/BLUE SHIELD | Source: Ambulatory Visit | Attending: Internal Medicine | Admitting: Internal Medicine

## 2014-11-19 VITALS — BP 110/76 | HR 88 | Temp 98.8°F | Ht 68.0 in | Wt 191.2 lb

## 2014-11-19 DIAGNOSIS — Z0000 Encounter for general adult medical examination without abnormal findings: Secondary | ICD-10-CM

## 2014-11-19 DIAGNOSIS — K219 Gastro-esophageal reflux disease without esophagitis: Secondary | ICD-10-CM

## 2014-11-19 DIAGNOSIS — J4520 Mild intermittent asthma, uncomplicated: Secondary | ICD-10-CM

## 2014-11-19 DIAGNOSIS — Z23 Encounter for immunization: Secondary | ICD-10-CM

## 2014-11-19 DIAGNOSIS — I10 Essential (primary) hypertension: Secondary | ICD-10-CM

## 2014-11-19 DIAGNOSIS — G90523 Complex regional pain syndrome I of lower limb, bilateral: Secondary | ICD-10-CM

## 2014-11-19 DIAGNOSIS — E785 Hyperlipidemia, unspecified: Secondary | ICD-10-CM

## 2014-11-19 DIAGNOSIS — R05 Cough: Secondary | ICD-10-CM

## 2014-11-19 DIAGNOSIS — R053 Chronic cough: Secondary | ICD-10-CM

## 2014-11-19 LAB — CBC WITH DIFFERENTIAL/PLATELET
BASOS ABS: 0.1 10*3/uL (ref 0.0–0.1)
BASOS PCT: 0.6 % (ref 0.0–3.0)
Eosinophils Absolute: 0.5 10*3/uL (ref 0.0–0.7)
Eosinophils Relative: 5.3 % — ABNORMAL HIGH (ref 0.0–5.0)
HEMATOCRIT: 40.8 % (ref 36.0–46.0)
Hemoglobin: 13.8 g/dL (ref 12.0–15.0)
LYMPHS ABS: 2.2 10*3/uL (ref 0.7–4.0)
LYMPHS PCT: 22.6 % (ref 12.0–46.0)
MCHC: 33.7 g/dL (ref 30.0–36.0)
MCV: 85.3 fl (ref 78.0–100.0)
MONO ABS: 0.4 10*3/uL (ref 0.1–1.0)
Monocytes Relative: 4.2 % (ref 3.0–12.0)
Neutro Abs: 6.5 10*3/uL (ref 1.4–7.7)
Neutrophils Relative %: 67.3 % (ref 43.0–77.0)
Platelets: 246 10*3/uL (ref 150.0–400.0)
RBC: 4.79 Mil/uL (ref 3.87–5.11)
RDW: 13.9 % (ref 11.5–15.5)
WBC: 9.7 10*3/uL (ref 4.0–10.5)

## 2014-11-19 LAB — COMPREHENSIVE METABOLIC PANEL
ALT: 17 U/L (ref 0–35)
AST: 15 U/L (ref 0–37)
Albumin: 4.7 g/dL (ref 3.5–5.2)
Alkaline Phosphatase: 96 U/L (ref 39–117)
BILIRUBIN TOTAL: 0.4 mg/dL (ref 0.2–1.2)
BUN: 22 mg/dL (ref 6–23)
CO2: 29 mEq/L (ref 19–32)
Calcium: 10 mg/dL (ref 8.4–10.5)
Chloride: 103 mEq/L (ref 96–112)
Creatinine, Ser: 0.92 mg/dL (ref 0.40–1.20)
GFR: 68.49 mL/min (ref >60.00)
Glucose, Bld: 113 mg/dL — ABNORMAL HIGH (ref 70–99)
Potassium: 3.7 mEq/L (ref 3.5–5.1)
SODIUM: 139 meq/L (ref 135–145)
TOTAL PROTEIN: 7.2 g/dL (ref 6.0–8.3)

## 2014-11-19 LAB — LIPID PANEL
Cholesterol: 210 mg/dL — ABNORMAL HIGH (ref 0–200)
HDL: 34.4 mg/dL — ABNORMAL LOW (ref >39.00)
Total CHOL/HDL Ratio: 6

## 2014-11-19 LAB — T4, FREE: Free T4: 0.76 ng/dL (ref 0.60–1.60)

## 2014-11-19 LAB — LDL CHOLESTEROL, DIRECT: Direct LDL: 105 mg/dL

## 2014-11-19 NOTE — Assessment & Plan Note (Signed)
Probably related to GERD Will just check CXR

## 2014-11-19 NOTE — Assessment & Plan Note (Signed)
Probably the reason for her chronic cough Continue PPI

## 2014-11-19 NOTE — Assessment & Plan Note (Signed)
BP Readings from Last 3 Encounters:  11/19/14 110/76  10/25/14 120/66  09/26/14 127/80   Good control Due for labs

## 2014-11-19 NOTE — Assessment & Plan Note (Signed)
Continues with Dr Larna DaughtersKirstens but incomplete symptom control

## 2014-11-19 NOTE — Progress Notes (Signed)
Subjective:    Patient ID: [name], female    DOB: 02/26/1966, 49 y.o.   MRN: [id number]  HPI Here for physical Has lingering cough--worries about lung cancer Does have reflux--controlled if careful about eating Asthma ongoing---using the albuterol some now.  Thinks she has postnasal drip and gets anxious about all this  Continues with Dr Larna DaughtersKirstens and group Ongoing Rx and follow up Still has pain despite tibular blocks. May get lumbar blocks in future  On BP med Doing fine with that  Has kept up with gyn Pap done last year Colonoscopy due--gets every 2 years due multiple polyps [facility])  Current Outpatient Prescriptions on File Prior to Visit  Medication Sig Dispense Refill  . albuterol (PROAIR HFA) 108 (90 BASE) MCG/ACT inhaler Inhale 2 puffs into the lungs every 4 (four) hours as needed. 1 Inhaler 4  . calcium carbonate (OS-CAL) 600 MG TABS tablet Take 1 tablet (600 mg total) by mouth 2 (two) times daily with a meal. 60 tablet 12  . CYMBALTA 30 MG capsule TAKE 3 CAPSULES (90 MG TOTAL) BY MOUTH DAILY. 270 capsule 0  . esomeprazole (NEXIUM) 40 MG capsule TAKE 1 CAPSULE (40 MG TOTAL) BY MOUTH 2 (TWO) TIMES DAILY. 60 capsule 11  . fluticasone (FLONASE) 50 MCG/ACT nasal spray Place 2 sprays into both nostrils daily. 16 g 6  . HYDROcodone-acetaminophen (NORCO) 7.5-325 MG per tablet TAKE 1 TABLET BY MOUTH AT BEDTIME AS NEEDED 30 tablet 0  . ibuprofen (ADVIL,MOTRIN) 600 MG tablet TAKE 1 TABLET (600 MG TOTAL) BY MOUTH 3 (THREE) TIMES DAILY. 90 tablet 8  . lidocaine (LIDODERM) 5 % MAY USE UP TO THREE PATCHES A DAY ONTO THE SKIN DAILY. REMOVE & DISCARD PATCHES WITHIN 12 HOURS OR AS DIRECTED BY MD 90 patch 2  . loratadine (CLARITIN) 10 MG tablet Take 10 mg by mouth daily.      Marland Kitchen. losartan-hydrochlorothiazide (HYZAAR) 100-25 MG per tablet TAKE 1 TABLET BY MOUTH DAILY. 90 tablet 0  . PEG 3350-KCl-NaBcb-NaCl-NaSulf (PEG 3350/ELECTROLYTES) 240 G SOLR Take 1 Container by mouth  once. Colonoscopy prep    . tiZANidine (ZANAFLEX) 4 MG tablet TAKE 1 TABLET (4 MG TOTAL) BY MOUTH 4 (FOUR) TIMES DAILY. 120 tablet 2  . topiramate (TOPAMAX) 100 MG tablet TAKE 1 TABLET BY MOUTH IN THE MORNING AND 2 TABLETS BY MOUTH IN THE AFTERNOON 90 tablet 2  . topiramate (TOPAMAX) 200 MG tablet Take 1 tablet (200 mg total) by mouth daily. 30 tablet 2  . traMADol (ULTRAM) 50 MG tablet Take 1 tablet (50 mg total) by mouth 3 (three) times daily. 90 tablet 5  . traMADol (ULTRAM-ER) 100 MG 24 hr tablet Take 1 tablet (100 mg total) by mouth daily. 30 tablet 5  . vitamin E (VITAMIN E) 400 UNIT capsule Take 1 capsule (400 Units total) by mouth daily. 60 capsule 11   No current facility-administered medications on file prior to visit.    Allergies  Allergen Reactions  . Demerol [Meperidine] Anaphylaxis, Itching and Swelling  . Cymbalta [Duloxetine Hcl]     Generic makes her feel like she is crawling out of her skin  . Diazepam     REACTION: swelling  . Meperidine Hcl     REACTION: swelling  . Other     Axe Cologne    Past Medical History  Diagnosis Date  . Anxiety   . Asthma   . History of colonic polyps   . GERD (gastroesophageal reflux disease)   .  Hypertension   . Allergy   . Hyperlipidemia   . ASCVD (arteriosclerotic cardiovascular disease)   . Obesity (BMI 30.0-34.9)   . Sleep apnea     NO MACHINE RECOMMENDED  . TIA (transient ischemic attack) ~ 2009  . Migraines   . Daily headache     "depends on the season" (01/24/2013)  . Arthritis     "just the norm" (01/24/2013)  . Fibromyalgia   . RSD (reflex sympathetic dystrophy)     Past Surgical History  Procedure Laterality Date  . Oophorectomy Right 2009  . Laparoscopic incisional / umbilical / ventral hernia repair  01/24/2013    IHR w/mesh/notes  . Hernia repair    . Tonsillectomy  1990's  . Nasal septum surgery  1980's?  . Appendectomy  ~ 06/2007  . Bladder repair  ~ 06/2007    "same day after bladder lift" (01/24/2013)    . Bladder suspension  ~ 06/2007  . Vaginal hysterectomy  ` 06/2007  . Diagnostic laparoscopy  1990's & ~ 2000    "I've had a couple; for endometrosis" (01/24/2013)  . Incisional hernia repair N/A 01/24/2013    Procedure: LAPAROSCOPIC INCISIONAL HERNIA;  Surgeon: Shelly Rubenstein, MD;  Location: [facility] OR;  Service: General;  Laterality: N/A;  . Insertion of mesh N/A 01/24/2013    Procedure: INSERTION OF MESH;  Surgeon: Shelly Rubenstein, MD;  Location: [facility] OR;  Service: General;  Laterality: N/A;    Family History  Problem Relation Age of Onset  . Cancer Mother     lung  . Hypertension Mother   . Cancer Father     colon  . Hypertension Sister   . Cancer Sister   . Birth defects Maternal Grandmother     breast  . Birth defects Paternal Grandmother     uterine, stomach, lung  . Cancer Brother   . Hyperlipidemia Sister     History   Social History  . Marital Status: Married    Spouse Name: N/A    Number of Children: 2  . Years of Education: N/A   Occupational History  . Disabled due to RSD    Social History Main Topics  . Smoking status: Never Smoker   . Smokeless tobacco: Never Used  . Alcohol Use: 0.0 oz/week    0 Not specified per week     Comment: 01/24/2013 "drink or 2 once or /twice/year, if that"  . Drug Use: No  . Sexual Activity: Not Currently   Other Topics Concern  . Not on file   Social History Narrative   Review of Systems  Constitutional: Negative for fatigue.       Has lost some weight being careful Wears seat belt  HENT: Positive for hearing loss. Negative for tinnitus.        Has "stuffed" feeling in left ear Regular with dentist  Eyes:       Some blurry vision--due for eye exam  Respiratory: Positive for cough and shortness of breath.   Cardiovascular: Positive for chest pain, palpitations and leg swelling.       Stable sharp pain under left breast Palpitations come and go  Gastrointestinal: Positive for abdominal pain. Negative for nausea,  vomiting and blood in stool.  Endocrine: Positive for polydipsia and polyuria.  Genitourinary: Positive for dyspareunia.       Incontinent---wears pads   Skin: Positive for rash.       Has some cherry angiomas Transient "RSD rashes"  Allergic/Immunologic: Positive for  environmental allergies. Negative for immunocompromised state.  Neurological: Positive for dizziness, weakness and headaches. Negative for syncope.  Hematological: Negative for adenopathy. Does not bruise/bleed easily.  Psychiatric/Behavioral: Positive for sleep disturbance and dysphoric mood. The patient is nervous/anxious.        Intermittent mood issues with chronic pain       Objective:   Physical Exam  Constitutional: She is oriented to person, place, and time. She appears well-developed and well-nourished. No distress.  HENT:  Head: Normocephalic and atraumatic.  Right Ear: External ear normal.  Left Ear: External ear normal.  Mouth/Throat: Oropharynx is clear and moist. No oropharyngeal exudate.  Eyes: Conjunctivae and EOM are normal. Pupils are equal, round, and reactive to light.  Neck: Normal range of motion. Neck supple. No thyromegaly present.  Cardiovascular: Normal rate, regular rhythm, normal heart sounds and intact distal pulses.  Exam reveals no gallop.   No murmur heard. Pulmonary/Chest: Effort normal and breath sounds normal. No respiratory distress. She has no wheezes. She has no rales.  Abdominal: Soft. There is no tenderness.  Musculoskeletal: She exhibits no edema.  Moves slowly Trouble with table mobility Pain with movement of right shoulder but no bursa tenderness or true impingement  Lymphadenopathy:    She has no cervical adenopathy.  Neurological: She is alert and oriented to person, place, and time.  Skin: No rash noted. No erythema.  Pearly cyst (tiny) right neck  Psychiatric:  Dysthymic mood           Assessment & Plan:

## 2014-11-19 NOTE — Addendum Note (Signed)
Addended by: Sydell AxonLAWS, Lanorris Kalisz C on: 11/19/2014 01:44 PM   Modules accepted: Orders

## 2014-11-19 NOTE — Assessment & Plan Note (Signed)
UTD with pap and mammogram Due for colonoscopy --they have done call back and she will schedule Flu and Tdap today

## 2014-11-19 NOTE — Progress Notes (Signed)
Pre visit review using our clinic review tool, if applicable. No additional management support is needed unless otherwise documented below in the visit note. 

## 2014-11-19 NOTE — Assessment & Plan Note (Signed)
Uses inhaler only occasionally

## 2014-11-24 ENCOUNTER — Other Ambulatory Visit: Payer: Self-pay | Admitting: Internal Medicine

## 2014-11-25 ENCOUNTER — Encounter: Payer: Worker's Compensation | Attending: Registered Nurse | Admitting: Registered Nurse

## 2014-11-25 ENCOUNTER — Encounter: Payer: Self-pay | Admitting: Registered Nurse

## 2014-11-25 VITALS — BP 138/74 | HR 86 | Resp 14

## 2014-11-25 DIAGNOSIS — G8929 Other chronic pain: Secondary | ICD-10-CM | POA: Insufficient documentation

## 2014-11-25 DIAGNOSIS — Z760 Encounter for issue of repeat prescription: Secondary | ICD-10-CM | POA: Insufficient documentation

## 2014-11-25 DIAGNOSIS — G90522 Complex regional pain syndrome I of left lower limb: Secondary | ICD-10-CM | POA: Diagnosis not present

## 2014-11-25 DIAGNOSIS — R52 Pain, unspecified: Secondary | ICD-10-CM | POA: Insufficient documentation

## 2014-11-25 DIAGNOSIS — Z79899 Other long term (current) drug therapy: Secondary | ICD-10-CM | POA: Diagnosis not present

## 2014-11-25 DIAGNOSIS — G894 Chronic pain syndrome: Secondary | ICD-10-CM

## 2014-11-25 DIAGNOSIS — G9050 Complex regional pain syndrome I, unspecified: Secondary | ICD-10-CM | POA: Diagnosis not present

## 2014-11-25 DIAGNOSIS — F329 Major depressive disorder, single episode, unspecified: Secondary | ICD-10-CM | POA: Insufficient documentation

## 2014-11-25 DIAGNOSIS — Z5181 Encounter for therapeutic drug level monitoring: Secondary | ICD-10-CM | POA: Diagnosis not present

## 2014-11-25 MED ORDER — HYDROCODONE-ACETAMINOPHEN 7.5-325 MG PO TABS: ORAL_TABLET | ORAL | Status: DC

## 2014-11-25 NOTE — Progress Notes (Signed)
Subjective:    Patient ID: [name], female    DOB: 12/20/1965, 49 y.o.   MRN: [id number]  HPI: [name] is a 49 year old female who returns for follow up for chronic pain and medication refill. She's complaining of generalized pain all over.She rates her pain 10. Her current exercise regime is performing stretching exercises and walking for short distances. She states she fell last week Wednesday or Thursday she was walking with her niece and fell off the curb and landed on her left side. Her niece helped her up she didn't seek medical attention. She states she's still sore today.  Pain Inventory Average Pain 9 Pain Right Now 10 My pain is constant, sharp, dull, stabbing and aching  In the last 24 hours, has pain interfered with the following? General activity 4 Relation with others 7 Enjoyment of life 7 What TIME of day is your pain at its worst? ALL Sleep (in general) Poor  Pain is worse with: walking, bending, sitting, standing and some activites Pain improves with: rest, heat/ice, therapy/exercise, pacing activities, medication and TENS Relief from Meds: 3  Mobility walk without assistance how many minutes can you walk? 20 ability to climb steps?  yes do you drive?  yes  Function disabled: date disabled .  Neuro/Psych bladder control problems weakness numbness tremor tingling trouble walking spasms anxiety  Prior Studies Any changes since last visit?  no  Physicians involved in your care Any changes since last visit?  no   Family History  Problem Relation Age of Onset  . Cancer Mother     lung  . Hypertension Mother   . Cancer Father     colon  . Hypertension Sister   . Cancer Sister   . Birth defects Maternal Grandmother     breast  . Birth defects Paternal Grandmother     uterine, stomach, lung  . Cancer Brother   . Hyperlipidemia Sister    History   Social History  . Marital Status: Married    Spouse Name: N/A   Number of Children: 2  . Years of Education: N/A   Occupational History  . Disabled due to RSD    Social History Main Topics  . Smoking status: Never Smoker   . Smokeless tobacco: Never Used  . Alcohol Use: 0.0 oz/week    0 Not specified per week     Comment: 01/24/2013 "drink or 2 once or /twice/year, if that"  . Drug Use: No  . Sexual Activity: Not Currently   Other Topics Concern  . None   Social History Narrative   Past Surgical History  Procedure Laterality Date  . Oophorectomy Right 2009  . Laparoscopic incisional / umbilical / ventral hernia repair  01/24/2013    IHR w/mesh/notes  . Hernia repair    . Tonsillectomy  1990's  . Nasal septum surgery  1980's?  . Appendectomy  ~ 06/2007  . Bladder repair  ~ 06/2007    "same day after bladder lift" (01/24/2013)  . Bladder suspension  ~ 06/2007  . Vaginal hysterectomy  ` 06/2007  . Diagnostic laparoscopy  1990's & ~ 2000    "I've had a couple; for endometrosis" (01/24/2013)  . Incisional hernia repair N/A 01/24/2013    Procedure: LAPAROSCOPIC INCISIONAL HERNIA;  Surgeon: Shelly Rubenstein, MD;  Location: [facility] OR;  Service: General;  Laterality: N/A;  . Insertion of mesh N/A 01/24/2013    Procedure: INSERTION OF MESH;  Surgeon: Shelly Rubenstein,  MD;  Location: [facility] OR;  Service: General;  Laterality: N/A;   Past Medical History  Diagnosis Date  . Anxiety   . Asthma   . History of colonic polyps   . GERD (gastroesophageal reflux disease)   . Hypertension   . Allergy   . Hyperlipidemia   . ASCVD (arteriosclerotic cardiovascular disease)   . Obesity (BMI 30.0-34.9)   . Sleep apnea     NO MACHINE RECOMMENDED  . TIA (transient ischemic attack) ~ 2009  . Migraines   . Daily headache     "depends on the season" (01/24/2013)  . Arthritis     "just the norm" (01/24/2013)  . Fibromyalgia   . RSD (reflex sympathetic dystrophy)    BP 138/74 mmHg  Pulse 86  Resp 14  SpO2 97%  Opioid Risk Score:   Fall Risk Score: Moderate Fall Risk  (6-13 points)  Review of Systems  HENT: Negative.   Eyes: Negative.   Respiratory: Negative.   Cardiovascular: Negative.   Gastrointestinal: Negative.   Endocrine: Negative.   Genitourinary: Negative.        Bladder control problems  Musculoskeletal: Positive for myalgias, back pain and arthralgias.  Skin: Negative.   Allergic/Immunologic: Negative.   Neurological: Positive for tremors, weakness and numbness.       Tingling, trouble walking, spasms  Hematological: Negative.   Psychiatric/Behavioral: The patient is nervous/anxious.        Objective:   Physical Exam  Constitutional: She is oriented to person, place, and time. She appears well-developed and well-nourished.  HENT:  Head: Normocephalic and atraumatic.  Neck: Normal range of motion. Neck supple.  Cardiovascular: Normal rate and regular rhythm.   Pulmonary/Chest: Effort normal and breath sounds normal.  Musculoskeletal:  Normal Muscle Bulk and Muscle Testing Reveals: Upper Extremities: Right Full ROM and Muscle strength 5/5 Left: Decreased ROM 45 Degrees and Muscle strength 3/5 Bilateral Spine of Scapula Tenderness Lower Extremities: Full ROM and Muscle strength 5/5 Bilateral Lower extremities Flexion Produces pain into sole's of feet and toes Arises from chair with ease Narrow Based Gait  Neurological: She is alert and oriented to person, place, and time.  Skin: Skin is warm and dry.  Psychiatric: She has a normal mood and affect.  Nursing note and vitals reviewed.         Assessment & Plan:  1. Complex Regional Pain Syndrome Type 1  Refilled: Hydro-codone 7.5/325mg  one tablet at bedtime #30. Continue:Tramadol 100 mg ER daily #30 and Tramadol 50 mg one tablet by mouth TID #90.  2. Depression: Continue Cymbalta  20 minutes of face to face patient care time was spent during this visit. All questions were encouraged and answered.   F/U in 1 month

## 2014-11-26 ENCOUNTER — Encounter: Payer: Self-pay | Admitting: Internal Medicine

## 2014-11-29 ENCOUNTER — Encounter: Payer: Self-pay | Admitting: Physical Medicine & Rehabilitation

## 2014-11-29 ENCOUNTER — Telehealth: Payer: Self-pay | Admitting: *Deleted

## 2014-11-29 NOTE — Telephone Encounter (Signed)
I spoke with [name] and assured her that we had attempted to reach the [place] since November and they had no been trying to contact us.  I called and spoke with [name] in place of [facility] who was out of the office.  She said they needed a statement of medical need for the medications before they could process the meds. Dr. Letta Pate wrote a letter and I called [name] back and informed her that we would be faxing the letter (4:32pm) I let [name] know this was done.

## 2014-11-29 NOTE — Telephone Encounter (Signed)
[name] has called and has cut off all her medications. I called and spoke to the workman's comp.  and  is saying they need medical necessity statement as to why [name] needs these medications. Can you write a letter stating why she needs the medications we are prescribing for her.

## 2014-12-02 ENCOUNTER — Telehealth: Payer: Self-pay | Admitting: *Deleted

## 2014-12-02 NOTE — Telephone Encounter (Signed)
Pt claims all her medications have been denied. Pt claims the insurance adjuster has been trying to get ahold of us for the last 2 weeks to get updated medical records. Contact info is: Librarian, academicClaims Examiner Melissa Wallingford [phone]  Ext [phone],  Nurse  Kathlen Modyegina Paul [phone]   Ext [phone].   Patient is asking for a call back

## 2014-12-03 ENCOUNTER — Other Ambulatory Visit: Payer: Self-pay | Admitting: Physical Medicine & Rehabilitation

## 2014-12-03 NOTE — Telephone Encounter (Signed)
Recd fax request from Pharmacy for refill on patients Tramadol 50 mg.  Faxed URGENT refill request to workers comp carrier for approval

## 2014-12-06 ENCOUNTER — Encounter: Payer: Self-pay | Admitting: Internal Medicine

## 2014-12-11 ENCOUNTER — Other Ambulatory Visit: Payer: Self-pay | Admitting: *Deleted

## 2014-12-11 NOTE — Telephone Encounter (Signed)
Recd faxed utilization review.  Called insurance co to let them know that the patient is closely monitored by the physician and that she is seen monthly.

## 2014-12-18 ENCOUNTER — Telehealth: Payer: Self-pay | Admitting: *Deleted

## 2014-12-18 NOTE — Telephone Encounter (Signed)
[name] called back about her Cymbalta to make sure that we are writing for name brand only as she has an allergic (intolerance) to the generic version.  Our current rx is for the Cymbalta and not the duloxeitine. I have put an FYI flag about it as well.  It is noted under allergies.

## 2014-12-21 ENCOUNTER — Other Ambulatory Visit: Payer: Self-pay | Admitting: Physical Medicine & Rehabilitation

## 2014-12-23 ENCOUNTER — Other Ambulatory Visit: Payer: Self-pay | Admitting: Physical Medicine & Rehabilitation

## 2014-12-24 ENCOUNTER — Encounter: Payer: Self-pay | Admitting: Registered Nurse

## 2014-12-24 ENCOUNTER — Encounter: Payer: Worker's Compensation | Attending: Registered Nurse | Admitting: Registered Nurse

## 2014-12-24 VITALS — BP 116/63 | HR 82 | Resp 14

## 2014-12-24 DIAGNOSIS — G4700 Insomnia, unspecified: Secondary | ICD-10-CM

## 2014-12-24 DIAGNOSIS — Z5181 Encounter for therapeutic drug level monitoring: Secondary | ICD-10-CM

## 2014-12-24 DIAGNOSIS — F329 Major depressive disorder, single episode, unspecified: Secondary | ICD-10-CM | POA: Diagnosis not present

## 2014-12-24 DIAGNOSIS — Z79899 Other long term (current) drug therapy: Secondary | ICD-10-CM

## 2014-12-24 DIAGNOSIS — R52 Pain, unspecified: Secondary | ICD-10-CM | POA: Insufficient documentation

## 2014-12-24 DIAGNOSIS — G8929 Other chronic pain: Secondary | ICD-10-CM | POA: Diagnosis not present

## 2014-12-24 DIAGNOSIS — G9050 Complex regional pain syndrome I, unspecified: Secondary | ICD-10-CM | POA: Diagnosis not present

## 2014-12-24 DIAGNOSIS — Z760 Encounter for issue of repeat prescription: Secondary | ICD-10-CM | POA: Diagnosis not present

## 2014-12-24 DIAGNOSIS — G90522 Complex regional pain syndrome I of left lower limb: Secondary | ICD-10-CM

## 2014-12-24 DIAGNOSIS — G894 Chronic pain syndrome: Secondary | ICD-10-CM

## 2014-12-24 MED ORDER — HYDROCODONE-ACETAMINOPHEN 7.5-325 MG PO TABS: ORAL_TABLET | ORAL | Status: DC

## 2014-12-24 MED ORDER — TRAZODONE HCL 50 MG PO TABS
ORAL_TABLET | ORAL | Status: DC
Start: 2014-12-24 — End: 2015-02-12

## 2014-12-24 NOTE — Progress Notes (Signed)
Subjective:    Patient ID: [name], female    DOB: 11-12-1965, 49 y.o.   MRN: [id number]  HPI: [name] is a 49 year old female who returns for follow up for chronic pain and medication refill. She says her pain is located in her left arm, bilateral lower extremities. Also has a headache. complaining of generalized pain all over.She rates her pain 10. Her current exercise regime is walking for short distances. Affect flat admits to being depressed denies any suicidal ideations or plans. Recommended counseling she denies at this time.  Pain Inventory Average Pain 10 Pain Right Now 10 My pain is constant, sharp, burning, stabbing, tingling and aching  In the last 24 hours, has pain interfered with the following? General activity 4 Relation with others 4 Enjoyment of life 4 What TIME of day is your pain at its worst? ALL Sleep (in general) Poor  Pain is worse with: walking, bending, sitting and standing Pain improves with: rest, heat/ice, therapy/exercise, medication and TENS Relief from Meds: 6  Mobility walk without assistance how many minutes can you walk? 10-15 ability to climb steps?  yes do you drive?  yes  Function disabled: date disabled 56  Neuro/Psych bladder control problems weakness numbness tremor tingling trouble walking spasms dizziness depression anxiety  Prior Studies Any changes since last visit?  no  Physicians involved in your care Any changes since last visit?  no   Family History  Problem Relation Age of Onset  . Cancer Mother     lung  . Hypertension Mother   . Cancer Father     colon  . Hypertension Sister   . Cancer Sister   . Birth defects Maternal Grandmother     breast  . Birth defects Paternal Grandmother     uterine, stomach, lung  . Cancer Brother   . Hyperlipidemia Sister    History   Social History  . Marital Status: Married    Spouse Name: N/A  . Number of Children: 2  . Years of Education:  N/A   Occupational History  . Disabled due to RSD    Social History Main Topics  . Smoking status: Never Smoker   . Smokeless tobacco: Never Used  . Alcohol Use: 0.0 oz/week    0 Standard drinks or equivalent per week     Comment: 01/24/2013 "drink or 2 once or /twice/year, if that"  . Drug Use: No  . Sexual Activity: Not Currently   Other Topics Concern  . None   Social History Narrative   Past Surgical History  Procedure Laterality Date  . Oophorectomy Right 2009  . Laparoscopic incisional / umbilical / ventral hernia repair  01/24/2013    IHR w/mesh/notes  . Hernia repair    . Tonsillectomy  1990's  . Nasal septum surgery  1980's?  . Appendectomy  ~ 06/2007  . Bladder repair  ~ 06/2007    "same day after bladder lift" (01/24/2013)  . Bladder suspension  ~ 06/2007  . Vaginal hysterectomy  ` 06/2007  . Diagnostic laparoscopy  1990's & ~ 2000    "I've had a couple; for endometrosis" (01/24/2013)  . Incisional hernia repair N/A 01/24/2013    Procedure: LAPAROSCOPIC INCISIONAL HERNIA;  Surgeon: Shelly Rubenstein, MD;  Location: [facility] OR;  Service: General;  Laterality: N/A;  . Insertion of mesh N/A 01/24/2013    Procedure: INSERTION OF MESH;  Surgeon: Shelly Rubenstein, MD;  Location: [facility] OR;  Service: General;  Laterality: N/A;   Past Medical History  Diagnosis Date  . Anxiety   . Asthma   . History of colonic polyps   . GERD (gastroesophageal reflux disease)   . Hypertension   . Allergy   . Hyperlipidemia   . ASCVD (arteriosclerotic cardiovascular disease)   . Obesity (BMI 30.0-34.9)   . Sleep apnea     NO MACHINE RECOMMENDED  . TIA (transient ischemic attack) ~ 2009  . Migraines   . Daily headache     "depends on the season" (01/24/2013)  . Arthritis     "just the norm" (01/24/2013)  . Fibromyalgia   . RSD (reflex sympathetic dystrophy)    Pulse 82  Resp 14  SpO2 96%  Opioid Risk Score:   Fall Risk Score: Low Fall Risk (0-5 points)  Review of Systems  HENT: Negative.     Eyes: Negative.   Respiratory: Negative.   Cardiovascular: Negative.   Gastrointestinal: Negative.   Endocrine: Negative.   Genitourinary:       Bladder control problems  Musculoskeletal: Positive for myalgias, back pain and arthralgias.  Skin: Negative.   Allergic/Immunologic: Negative.   Neurological: Positive for dizziness, tremors, weakness and numbness.       Tingling, trouble walking, spasms  Hematological: Negative.   Psychiatric/Behavioral: Positive for dysphoric mood.       Objective:   Physical Exam  Constitutional: She is oriented to person, place, and time. She appears well-developed and well-nourished.  HENT:  Head: Normocephalic and atraumatic.  Neck: Normal range of motion. Neck supple.  Cardiovascular: Normal rate and regular rhythm.   Pulmonary/Chest: Effort normal and breath sounds normal.  Musculoskeletal:  Normal Muscle Bulk and Muscle testing Reveals: Upper Extremities: Right Full ROM and Muscle Strength 5/5 Left: Decreased ROM 90 Degrees and Muscle Strength 3/5 Lower Extremities: Full ROM and Muscle Strength 5/5 Bilateral Lower Extremity Flexion Produces Pain into upper anterior Foot arises from chair with ease Antalgic Gait     Neurological: She is alert and oriented to person, place, and time.  Skin: Skin is warm and dry.  Psychiatric:  Flat Affect  Nursing note and vitals reviewed.         Assessment & Plan:  1. Complex Regional Pain Syndrome Type 1  Refilled: Hydro-codone 7.5/325mg  one tablet at bedtime #30. Continue:Tramadol 100 mg ER daily #30 and Tramadol 50 mg one tablet by mouth TID #90.  2. Depression: Continue Cymbalta, recommended counseling. Denies at this time 3. Insomnia: RX: Trazodone 20 minutes of face to face patient care time was spent during this visit. All questions were encouraged and answered.   F/U in 1 month

## 2014-12-25 ENCOUNTER — Telehealth: Payer: Self-pay | Admitting: *Deleted

## 2014-12-25 ENCOUNTER — Encounter: Payer: Self-pay | Admitting: *Deleted

## 2014-12-25 NOTE — Telephone Encounter (Signed)
I tried several times to reach patients case worker in reference to the new RX for Trazodone. Faxed information stating the Trazodone can be prescribed for sleep disorders.  Also stated in fax that patient sees Dr. Wynn BankerKirsteins one month and then Jacalyn LefevreEunice Thomas the next month.

## 2014-12-25 NOTE — Telephone Encounter (Signed)
Pt seen yesterday by Jacalyn LefevreEunice Thomas, pt prescribed trazadone for sleep aid. She just got off the phone with the adjustor. They say this medication is for depression. Pt is asking for documented proof that the medication is intended for sleep aid. She would also like documented proof that indicates that when she visits this clinic she may be seen by Dr. Riley KillSwartz or Jacalyn LefevreEunice Thomas, NP

## 2014-12-27 ENCOUNTER — Encounter: Payer: Self-pay | Admitting: *Deleted

## 2014-12-27 ENCOUNTER — Other Ambulatory Visit: Payer: Self-pay | Admitting: Registered Nurse

## 2014-12-27 NOTE — Telephone Encounter (Signed)
Recd fax requesting letter of medical necessity for patients Trazodone.  Sent letter and last office note to [place].

## 2015-01-27 ENCOUNTER — Encounter: Payer: Self-pay | Admitting: Physical Medicine & Rehabilitation

## 2015-01-27 ENCOUNTER — Encounter: Payer: Worker's Compensation | Attending: Physical Medicine & Rehabilitation

## 2015-01-27 ENCOUNTER — Ambulatory Visit (HOSPITAL_BASED_OUTPATIENT_CLINIC_OR_DEPARTMENT_OTHER): Payer: Worker's Compensation | Admitting: Physical Medicine & Rehabilitation

## 2015-01-27 VITALS — BP 118/78 | HR 83 | Resp 14

## 2015-01-27 DIAGNOSIS — G90512 Complex regional pain syndrome I of left upper limb: Secondary | ICD-10-CM | POA: Insufficient documentation

## 2015-01-27 DIAGNOSIS — G90522 Complex regional pain syndrome I of left lower limb: Secondary | ICD-10-CM | POA: Insufficient documentation

## 2015-01-27 DIAGNOSIS — G90519 Complex regional pain syndrome I of unspecified upper limb: Secondary | ICD-10-CM | POA: Insufficient documentation

## 2015-01-27 MED ORDER — TRAMADOL HCL ER 100 MG PO TB24: 100.0000 mg | ORAL_TABLET | Freq: Every day | ORAL | Status: DC

## 2015-01-27 MED ORDER — HYDROCODONE-ACETAMINOPHEN 7.5-325 MG PO TABS: ORAL_TABLET | ORAL | Status: DC

## 2015-01-27 MED ORDER — TIZANIDINE HCL 4 MG PO TABS: ORAL_TABLET | ORAL | Status: DC

## 2015-01-27 MED ORDER — TRAMADOL HCL 50 MG PO TABS: 50.0000 mg | ORAL_TABLET | Freq: Three times a day (TID) | ORAL | Status: DC

## 2015-01-27 NOTE — Progress Notes (Signed)
Subjective:    Patient ID: [name], female    DOB: [birth year], 49 y.o.   MRN: [id number]  HPI   49 year old female with reflex sympathetic dystrophy initially starting in the left wrist. She's had symptomatology in the left lower extremity as well.  Left tibial nerve block performed on December 10th 2015, Seven-day relief but states that it was like heaven.   Medications include Cymbalta 30 mg a day Norco 7.5 mg daily at bedtime Lidoderm patch usually at bedtime Zanaflex 4 mg 4 times a day Topamax 100 g in the morning 200 mg daily at bedtime Tramadol 50 mg3 times a day Tramadol ER 100 mg per day at bedtime Trazodone 25 mg daily at bedtime- awaiting WC approval    Pain Inventory Average Pain 10 Pain Right Now 8 My pain is constant, sharp, burning, stabbing, tingling and aching  In the last 24 hours, has pain interfered with the following? General activity 7 Relation with others 5 Enjoyment of life 6 What TIME of day is your pain at its worst? all Sleep (in general) Poor  Pain is worse with: walking, bending, sitting and standing Pain improves with: rest, heat/ice, pacing activities, medication and TENS Relief from Meds: 6  Mobility walk without assistance ability to climb steps?  yes do you drive?  yes  Function disabled: date disabled . I need assistance with the following:  household duties and shopping  Neuro/Psych bladder control problems weakness numbness tremor tingling trouble walking spasms dizziness depression loss of taste or smell  Prior Studies Any changes since last visit?  no  Physicians involved in your care Any changes since last visit?  no   Family History  Problem Relation Age of Onset  . Cancer Mother     lung  . Hypertension Mother   . Cancer Father     colon  . Hypertension Sister   . Cancer Sister   . Birth defects Maternal Grandmother     breast  . Birth defects Paternal Grandmother     uterine, stomach,  lung  . Cancer Brother   . Hyperlipidemia Sister    History   Social History  . Marital Status: Married    Spouse Name: N/A  . Number of Children: 2  . Years of Education: N/A   Occupational History  . Disabled due to RSD    Social History Main Topics  . Smoking status: Never Smoker   . Smokeless tobacco: Never Used  . Alcohol Use: 0.0 oz/week    0 Standard drinks or equivalent per week     Comment: 01/24/2013 "drink or 2 once or /twice/year, if that"  . Drug Use: No  . Sexual Activity: Not Currently   Other Topics Concern  . None   Social History Narrative   Past Surgical History  Procedure Laterality Date  . Oophorectomy Right 2009  . Laparoscopic incisional / umbilical / ventral hernia repair  01/24/2013    IHR w/mesh/notes  . Hernia repair    . Tonsillectomy  1990's  . Nasal septum surgery  1980's?  . Appendectomy  ~ 06/2007  . Bladder repair  ~ 06/2007    "same day after bladder lift" (01/24/2013)  . Bladder suspension  ~ 06/2007  . Vaginal hysterectomy  ` 06/2007  . Diagnostic laparoscopy  1990's & ~ 2000    "I've had a couple; for endometrosis" (01/24/2013)  . Incisional hernia repair N/A 01/24/2013    Procedure: LAPAROSCOPIC INCISIONAL HERNIA;  Surgeon: Marco Collie  Ninfa Linden, MD;  Location: [facility];  Service: General;  Laterality: N/A;  . Insertion of mesh N/A 01/24/2013    Procedure: INSERTION OF MESH;  Surgeon: Harl Bowie, MD;  Location: [facility];  Service: General;  Laterality: N/A;   Past Medical History  Diagnosis Date  . Anxiety   . Asthma   . History of colonic polyps   . GERD (gastroesophageal reflux disease)   . Hypertension   . Allergy   . Hyperlipidemia   . ASCVD (arteriosclerotic cardiovascular disease)   . Obesity (BMI 30.0-34.9)   . Sleep apnea     NO MACHINE RECOMMENDED  . TIA (transient ischemic attack) ~ 2009  . Migraines   . Daily headache     "depends on the season" (01/24/2013)  . Arthritis     "just the norm" (01/24/2013)  . Fibromyalgia     . RSD (reflex sympathetic dystrophy)    BP 118/78 mmHg  Pulse 83  Resp 14  SpO2 98%  Opioid Risk Score:   Fall Risk Score: Moderate Fall Risk (6-13 points) (patient previously educated)`1  Depression screen PHQ 2/9  No flowsheet data found.   Review of Systems  Constitutional: Negative for appetite change.  Genitourinary: Positive for urgency and frequency.       Incontinence  Neurological: Positive for dizziness, tremors, weakness and numbness.       Tingling spasms  Psychiatric/Behavioral: Positive for dysphoric mood. The patient is nervous/anxious.   All other systems reviewed and are negative.      Objective:   Physical Exam  Constitutional: She is oriented to person, place, and time. She appears well-developed and well-nourished.  HENT:  Head: Normocephalic and atraumatic.  Eyes: Conjunctivae are normal. Pupils are equal, round, and reactive to light.  Neck: Normal range of motion.  Neurological: She is alert and oriented to person, place, and time.  Psychiatric: She has a normal mood and affect.  Nursing note and vitals reviewed.  No swelling of the left foot hypersensitivity to touch on the dorsum of the foot. Left hand hypersensitivity to touch, Limited use of the left hand. Picks up her sweater with her thumb and index finger Motor strength is 3/5 in the deltoid biceps triceps and finger flexors and extensors 4/5 bilateral hip flexion and extensor ankle dorsiflex and plantarflex her       Assessment & Plan:  1. Reflex sympathetic dystrophy was initially in the left upper extremity now affecting left lower extremity greater than right lower extremity, right upper extremity is not affected.  She had about 1 week relief with the tibial nerve block, therefore think she is a good candidate for cryo-neuro lysis of the left tibial nerve.This could potentially give [place] a longer duration response and perhaps be able to reduce medication usage.  Reviewed pain  medications, remains functional on the current combination. Sleep disturbance trying to get approval for trial of trazodone

## 2015-02-12 ENCOUNTER — Telehealth: Payer: Self-pay | Admitting: *Deleted

## 2015-02-12 MED ORDER — TRAZODONE HCL 50 MG PO TABS: ORAL_TABLET | ORAL | Status: DC

## 2015-02-12 NOTE — Telephone Encounter (Signed)
Suzanne RiegerLaura called and said that [facility] does not have the trazodone order.  (Epic has confirmed receipt by pharmacy in medications), but I have placed new electronic order for her.

## 2015-02-25 ENCOUNTER — Ambulatory Visit (HOSPITAL_BASED_OUTPATIENT_CLINIC_OR_DEPARTMENT_OTHER): Payer: Worker's Compensation | Admitting: Physical Medicine & Rehabilitation

## 2015-02-25 ENCOUNTER — Encounter: Payer: Worker's Compensation | Attending: Physical Medicine & Rehabilitation

## 2015-02-25 ENCOUNTER — Encounter: Payer: Self-pay | Admitting: Physical Medicine & Rehabilitation

## 2015-02-25 VITALS — BP 122/84 | HR 85 | Resp 14

## 2015-02-25 DIAGNOSIS — G90522 Complex regional pain syndrome I of left lower limb: Secondary | ICD-10-CM

## 2015-02-25 MED ORDER — HYDROCODONE-ACETAMINOPHEN 7.5-325 MG PO TABS: ORAL_TABLET | ORAL | Status: DC

## 2015-02-25 NOTE — Patient Instructions (Signed)
Next office visit is to see how long the procedure works

## 2015-02-25 NOTE — Progress Notes (Signed)
Procedure: 1. Left tibial nerve block with peripheral nerve stim guidance. 2.Cryoablation left tibial nerve under ultrasound guidance Indication reflex sympathetic dystrophy left lower extremity which has responded on 2 occasions 2 left tibial nerve block with Marcaine for a duration of 3 days each. Pain interferes with ambulation, patient is unable to wear shoes, wears slippers at all times.  Informed consent was obtained after describing risks and benefits of the procedure with the patient including bleeding bruising infection. Patient elects to proceed and has given written consent. Patient placed prone on exam table  EMG stimulator used to locate tibial nerve twitch left popliteal fossa. This area was marked prepped with a Betadine. Then a 22-gauge 40 mm needle electrode was inserted under electrical stimulation guidance to a depth of 3 cm. Tibial nerve twitch with plantar flexion was confirmed at less than 1 mA. Then 5 cc of 0.25% Marcaine were injected.  Then a 18-gauge 1.5 inch needle was inserted to a depth of 3 cm Under ultrasound guidance a 55 mm cryo-probe with 10 mm active tip was inserted to the tibial nerve in the prior track. Cryoablation was performed for 60 seconds using the iovera device. Needle was angled more caudal and a separate 6os cryoablation lesion was created. Patient tolerated procedure well Post procedure instructions given Follow-up in 1 month

## 2015-02-27 ENCOUNTER — Telehealth: Payer: Self-pay | Admitting: *Deleted

## 2015-02-27 ENCOUNTER — Other Ambulatory Visit: Payer: Self-pay | Admitting: Physical Medicine & Rehabilitation

## 2015-02-27 ENCOUNTER — Other Ambulatory Visit: Payer: Self-pay | Admitting: Internal Medicine

## 2015-02-27 NOTE — Telephone Encounter (Signed)
Called to check on [name] after cryoneurolysis.  She says that her toes feel better and she can wiggle them but the middle of her foot is the same.  She did have a little feeling like leg would give out on her yesterday but is better today.  I toldher that may be more related to the numbing medication that we gave her pre cryo more so than the actual cryo procedure.

## 2015-03-27 ENCOUNTER — Ambulatory Visit (HOSPITAL_BASED_OUTPATIENT_CLINIC_OR_DEPARTMENT_OTHER): Payer: Worker's Compensation | Admitting: Physical Medicine & Rehabilitation

## 2015-03-27 ENCOUNTER — Encounter: Payer: Self-pay | Admitting: Physical Medicine & Rehabilitation

## 2015-03-27 ENCOUNTER — Encounter: Payer: Worker's Compensation | Attending: Physical Medicine & Rehabilitation

## 2015-03-27 VITALS — BP 126/83 | HR 85 | Resp 14

## 2015-03-27 DIAGNOSIS — G90522 Complex regional pain syndrome I of left lower limb: Secondary | ICD-10-CM | POA: Diagnosis present

## 2015-03-27 DIAGNOSIS — G90521 Complex regional pain syndrome I of right lower limb: Secondary | ICD-10-CM

## 2015-03-27 DIAGNOSIS — G90512 Complex regional pain syndrome I of left upper limb: Secondary | ICD-10-CM | POA: Insufficient documentation

## 2015-03-27 MED ORDER — HYDROCODONE-ACETAMINOPHEN 7.5-325 MG PO TABS: ORAL_TABLET | ORAL | Status: DC

## 2015-03-27 NOTE — Patient Instructions (Addendum)
We'll see back in one month to see how this procedure helps you. May work for up to about 3 months. We'll also check on the left block as well.  Please call if you develop evidence of redness around the injection site or any drainage. You may use ice to the back of the knee for 20 minutes every 2 hours as needed for the next 24-48 hours

## 2015-03-27 NOTE — Progress Notes (Signed)
Procedure: 1. Right tibial nerve block with peripheral nerve stim guidance. 2.Cryoablation right tibial nerve under ultrasound guidance Indication reflex sympathetic dystrophy right lower extremity . Pain interferes with ambulation, patient is unable to wear shoes, wears slippers at all times. Left tibial nerve block with cryoablation one month ago relieved left  Informed consent was obtained after describing risks and benefits of the procedure with the patient including bleeding bruising infection. Patient elects to proceed and has given written consent. Patient placed prone on exam table  EMG stimulator used to locate tibial nerve twitch left popliteal fossa. This area was marked prepped with a Betadine. Then a 22-gauge 40 mm needle electrode was inserted under electrical stimulation guidance to a depth of 3 cm. Tibial nerve twitch with plantar flexion was confirmed at less than 1 mA. Then 5 cc of 0.25% Marcaine were injected.  Then a 18-gauge 1.5 inch needle was inserted to a depth of 3 cm Under ultrasound guidance a 55 mm cryo-probe with 10 mm active tip was inserted to the tibial nerve in the prior track. Cryoablation was performed for 60 seconds using the iovera device. Needle was angled more caudal and a separate 6os cryoablation lesion was created. Patient tolerated procedure well Post procedure instructions given Follow-up in 1 month

## 2015-04-07 ENCOUNTER — Telehealth: Payer: Self-pay | Admitting: *Deleted

## 2015-04-07 ENCOUNTER — Ambulatory Visit: Payer: Self-pay | Admitting: Physical Medicine & Rehabilitation

## 2015-04-07 NOTE — Telephone Encounter (Signed)
Prior auth request from pharmacy for topiramate was faxed to workers comp [place].

## 2015-04-22 ENCOUNTER — Other Ambulatory Visit: Payer: Self-pay | Admitting: Physical Medicine & Rehabilitation

## 2015-04-24 ENCOUNTER — Telehealth: Payer: Self-pay | Admitting: Internal Medicine

## 2015-04-24 NOTE — Telephone Encounter (Signed)
[facility] Patient Name: JISELL MAJER DOB: 10/10/1966 Initial Comment Caller states she may have Shingles, she has had a rash for several weeks, felt like she had the flu, felt horrible. Blisters. Another patch starting on the front of her chest- feeling bad. Pain and itching. Nurse Assessment Nurse: Donalynn Furlong, RN, Myna Hidalgo Date/Time Eilene Ghazi Time): 04/24/2015 12:01:21 PM Confirm and document reason for call. If symptomatic, describe symptoms. ---Caller states she may have Shingles, she has had a rash for several weeks, felt like she had the flu, felt horrible. Blisters. Another patch starting on the front of her chest- feeling bad. Pain and itching. Pt was scratching her back with a butter knife. Afebrile. Has the patient traveled out of the country within the last 30 days? ---No Does the patient require triage? ---Yes Related visit to physician within the last 2 weeks? ---No Does the PT have any chronic conditions? (i.e. diabetes, asthma, etc.) ---Yes List chronic conditions. ---HBP, CPS : Pain Clinic, Fibromyalgia Did the patient indicate they were pregnant? ---No Guidelines Guideline Title Affirmed Question Affirmed Notes Shingles [1] Shingles rash AND [2] onset > 72 hours ago Final Disposition User See Physician within [place], RN, Myna Hidalgo Appt w Amy Diona Browner 7/8 at 8:30 am, pt aware

## 2015-04-24 NOTE — Telephone Encounter (Signed)
Pt has appt on 04/25/15 at 8:30 AM with Dr Ermalene SearingBedsole.

## 2015-04-25 ENCOUNTER — Encounter: Payer: Self-pay | Admitting: Family Medicine

## 2015-04-25 ENCOUNTER — Telehealth: Payer: Self-pay

## 2015-04-25 ENCOUNTER — Ambulatory Visit (INDEPENDENT_AMBULATORY_CARE_PROVIDER_SITE_OTHER): Payer: Self-pay | Admitting: Family Medicine

## 2015-04-25 VITALS — BP 130/78 | HR 74 | Temp 97.8°F | Ht 68.0 in | Wt 205.2 lb

## 2015-04-25 DIAGNOSIS — L578 Other skin changes due to chronic exposure to nonionizing radiation: Secondary | ICD-10-CM | POA: Insufficient documentation

## 2015-04-25 DIAGNOSIS — L559 Sunburn, unspecified: Secondary | ICD-10-CM

## 2015-04-25 MED ORDER — CLOBETASOL PROPIONATE 0.05 % EX CREA: 1.0000 "application " | TOPICAL_CREAM | Freq: Two times a day (BID) | CUTANEOUS | Status: DC

## 2015-04-25 NOTE — Progress Notes (Signed)
Pre visit review using our clinic review tool, if applicable. No additional management support is needed unless otherwise documented below in the visit note. 

## 2015-04-25 NOTE — Patient Instructions (Signed)
Call if rash not improving as expected in 2 weeks.

## 2015-04-25 NOTE — Assessment & Plan Note (Signed)
Crosses midline.. Not shingles  Treat with topical steroid cream x 2 weeks. Call if not improving as expected.

## 2015-04-25 NOTE — Telephone Encounter (Signed)
Pt said clobetasol was too expensive at [facility] and wants sent to another local pharmacy; advised pt to contact pharmacy she wants to fill rx and that pharmacy will contact [facility] for transfer. Pt voiced understanding; pt will try to borrow money and if has further problem will cb.

## 2015-04-25 NOTE — Progress Notes (Signed)
   Subjective:    Patient ID: [name], female    DOB: 02-Apr-1966, 49 y.o.   MRN: [id number]  HPI  49 year old female patient of Dr. Alla German with history of RSD, chronic pain in  left upper limb presents with new onset 4 weeks ago of rash on upper back near left shoulder blade. Then spread to right upper chest wall.  Husband saw blisters? White heads.  Felt itchy.  Some burning initially, better now.  Has been scratching with butter knife.   Rash appeared  after severe sunburn:  [facility] day     No new meds.  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  No new lotions, detergents.   She has been appling gold bond, cortisone cream.. No relief.    Review of Systems  Constitutional: Negative for fever and fatigue.  HENT: Negative for ear pain.   Eyes: Negative for pain.  Respiratory: Negative for chest tightness and shortness of breath.   Cardiovascular: Negative for chest pain, palpitations and leg swelling.  Gastrointestinal: Negative for abdominal pain.  Genitourinary: Negative for dysuria.       Objective:   Physical Exam  Constitutional: Vital signs are normal. She appears well-developed and well-nourished. She is cooperative.  Non-toxic appearance. She does not appear ill. No distress.  HENT:  Head: Normocephalic.  Right Ear: Hearing, tympanic membrane, external ear and ear canal normal. Tympanic membrane is not erythematous, not retracted and not bulging.  Left Ear: Hearing, tympanic membrane, external ear and ear canal normal. Tympanic membrane is not erythematous, not retracted and not bulging.  Nose: No mucosal edema or rhinorrhea. Right sinus exhibits no maxillary sinus tenderness and no frontal sinus tenderness. Left sinus exhibits no maxillary sinus tenderness and no frontal sinus tenderness.  Mouth/Throat: Uvula is midline, oropharynx is clear and moist and mucous membranes are normal.  Eyes: Conjunctivae, EOM and lids are normal. Pupils are equal, round, and reactive  to light. Lids are everted and swept, no foreign bodies found.  Neck: Trachea normal and normal range of motion. Neck supple. Carotid bruit is not present. No thyroid mass and no thyromegaly present.  Cardiovascular: Normal rate, regular rhythm, S1 normal, S2 normal, normal heart sounds, intact distal pulses and normal pulses.  Exam reveals no gallop and no friction rub.   No murmur heard. Pulmonary/Chest: Effort normal and breath sounds normal. No tachypnea. No respiratory distress. She has no decreased breath sounds. She has no wheezes. She has no rhonchi. She has no rales.  Abdominal: Soft. Normal appearance and bowel sounds are normal. There is no tenderness.  Neurological: She is alert.  Skin: Skin is warm, dry and intact. Rash noted. Rash is macular and nodular.     Erythema, no warmth, thickened skin with flake  in area of her previous sunburn.  Psychiatric: Her speech is normal and behavior is normal. Judgment and thought content normal. Her mood appears not anxious. Cognition and memory are normal. She does not exhibit a depressed mood.          Assessment & Plan:

## 2015-04-28 ENCOUNTER — Encounter: Payer: Self-pay | Admitting: Physical Medicine & Rehabilitation

## 2015-04-28 ENCOUNTER — Other Ambulatory Visit: Payer: Self-pay | Admitting: Physical Medicine & Rehabilitation

## 2015-04-28 ENCOUNTER — Encounter: Payer: Worker's Compensation | Attending: Physical Medicine & Rehabilitation

## 2015-04-28 ENCOUNTER — Ambulatory Visit (HOSPITAL_BASED_OUTPATIENT_CLINIC_OR_DEPARTMENT_OTHER): Payer: Worker's Compensation | Admitting: Physical Medicine & Rehabilitation

## 2015-04-28 VITALS — HR 84 | Resp 14

## 2015-04-28 DIAGNOSIS — G90512 Complex regional pain syndrome I of left upper limb: Secondary | ICD-10-CM | POA: Insufficient documentation

## 2015-04-28 DIAGNOSIS — G90522 Complex regional pain syndrome I of left lower limb: Secondary | ICD-10-CM | POA: Diagnosis present

## 2015-04-28 DIAGNOSIS — Z5181 Encounter for therapeutic drug level monitoring: Secondary | ICD-10-CM

## 2015-04-28 DIAGNOSIS — Z79899 Other long term (current) drug therapy: Secondary | ICD-10-CM

## 2015-04-28 DIAGNOSIS — G90523 Complex regional pain syndrome I of lower limb, bilateral: Secondary | ICD-10-CM

## 2015-04-28 DIAGNOSIS — G894 Chronic pain syndrome: Secondary | ICD-10-CM | POA: Diagnosis not present

## 2015-04-28 MED ORDER — IBUPROFEN 400 MG PO TABS: 400.0000 mg | ORAL_TABLET | Freq: Three times a day (TID) | ORAL | Status: DC

## 2015-04-28 MED ORDER — HYDROCODONE-ACETAMINOPHEN 7.5-325 MG PO TABS: ORAL_TABLET | ORAL | Status: DC

## 2015-04-28 NOTE — Patient Instructions (Signed)
Change ibuprofen to 438m TID

## 2015-04-28 NOTE — Progress Notes (Signed)
Subjective:    Patient ID: [name], female    DOB: 09-Aug-1966, 49 y.o.   MRN: [id number] [place] going into pool HPI Left tibial nerve cryoablation was helpful for several weeks Right tibial nerve cryoablation was helpful for several weks  Foot pain was reduced and able to wear flip/flops rather than slippers Using lidocaine patches on the left wrist as well as on both feet at nighttime. Reviewed Cymbalta. This was started several years ago. Started at 30 mg dose increased to the 60 mg dose but after an adequate relief increased to 90 mg dose. She had better relief at this dose.  Currently using tramadol 100 mg extended release also takes tramadol  50 mg 3 times a day with meals Uses hydrocodone 7.5 mg at bedtime  Discussed ibuprofen reduced dose from 800-600 mg. No big change in pain with that. Does have reflux symptoms does see gastroenterology. Is on esoomeprazole 40 mg twice a day Discussed that we can reduce this to 400 mg. May help with some of the reflux symptoms. Pain Inventory Average Pain 7 Pain Right Now 10 My pain is constant, sharp and burning  In the last 24 hours, has pain interfered with the following? General activity 10 Relation with others 10 Enjoyment of life 10 What TIME of day is your pain at its worst? ALL Sleep (in general) Poor  Pain is worse with: walking, bending, sitting, inactivity, standing and some activites Pain improves with: rest, heat/ice, therapy/exercise, pacing activities, medication, TENS and injections Relief from Meds: 6  Mobility walk without assistance how many minutes can you walk? 10 ability to climb steps?  yes do you drive?  yes  Function disabled: date disabled .  Neuro/Psych bladder control problems weakness numbness tremor tingling trouble walking spasms dizziness depression anxiety  Prior Studies Any changes since last visit?  no  Physicians involved in your care Any changes since last visit?   no   Family History  Problem Relation Age of Onset  . Cancer Mother     lung  . Hypertension Mother   . Cancer Father     colon  . Hypertension Sister   . Cancer Sister   . Birth defects Maternal Grandmother     breast  . Birth defects Paternal Grandmother     uterine, stomach, lung  . Cancer Brother   . Hyperlipidemia Sister    History   Social History  . Marital Status: Married    Spouse Name: N/A  . Number of Children: 2  . Years of Education: N/A   Occupational History  . Disabled due to RSD    Social History Main Topics  . Smoking status: Never Smoker   . Smokeless tobacco: Never Used  . Alcohol Use: 0.0 oz/week    0 Standard drinks or equivalent per week     Comment: 01/24/2013 "drink or 2 once or /twice/year, if that"  . Drug Use: No  . Sexual Activity: Not Currently   Other Topics Concern  . None   Social History Narrative   Past Surgical History  Procedure Laterality Date  . Oophorectomy Right 2009  . Laparoscopic incisional / umbilical / ventral hernia repair  01/24/2013    IHR w/mesh/notes  . Hernia repair    . Tonsillectomy  1990's  . Nasal septum surgery  1980's?  . Appendectomy  ~ 06/2007  . Bladder repair  ~ 06/2007    "same day after bladder lift" (01/24/2013)  . Bladder suspension  ~  06/2007  . Vaginal hysterectomy  ` 06/2007  . Diagnostic laparoscopy  1990's & ~ 2000    "I've had a couple; for endometrosis" (01/24/2013)  . Incisional hernia repair N/A 01/24/2013    Procedure: LAPAROSCOPIC INCISIONAL HERNIA;  Surgeon: Harl Bowie, MD;  Location: [facility];  Service: General;  Laterality: N/A;  . Insertion of mesh N/A 01/24/2013    Procedure: INSERTION OF MESH;  Surgeon: Harl Bowie, MD;  Location: [facility];  Service: General;  Laterality: N/A;   Past Medical History  Diagnosis Date  . Anxiety   . Asthma   . History of colonic polyps   . GERD (gastroesophageal reflux disease)   . Hypertension   . Allergy   . Hyperlipidemia   . ASCVD  (arteriosclerotic cardiovascular disease)   . Obesity (BMI 30.0-34.9)   . Sleep apnea     NO MACHINE RECOMMENDED  . TIA (transient ischemic attack) ~ 2009  . Migraines   . Daily headache     "depends on the season" (01/24/2013)  . Arthritis     "just the norm" (01/24/2013)  . Fibromyalgia   . RSD (reflex sympathetic dystrophy)    Pulse 84  Resp 14  SpO2 96%  Opioid Risk Score:   Fall Risk Score: Moderate Fall Risk (6-13 points)`1  Depression screen PHQ 2/9  No flowsheet data found.   Review of Systems  Constitutional: Negative.   HENT: Negative.   Eyes: Negative.   Respiratory: Negative.   Cardiovascular: Negative.   Gastrointestinal: Negative.   Endocrine: Negative.   Genitourinary: Negative.        Tingling, trouble walking, spasms  Musculoskeletal: Positive for myalgias, back pain and arthralgias.  Skin: Negative.   Allergic/Immunologic: Negative.   Neurological: Positive for dizziness and tremors.  Hematological: Negative.   Psychiatric/Behavioral: Positive for dysphoric mood. The patient is nervous/anxious.        Objective:   Physical Exam  Constitutional: She is oriented to person, place, and time. She appears well-developed and well-nourished.  HENT:  Head: Normocephalic and atraumatic.  Eyes: Conjunctivae and EOM are normal. Pupils are equal, round, and reactive to light.  Musculoskeletal:  Normal range of motion in the foot and ankle area as well as left upper extremity.  No nail changes.  Neurological: She is alert and oriented to person, place, and time.  Strength is limited by pain in the left upper extremity overall has 4 minus/5 Give Way weakness in the deltoid biceps triceps grip 4 minus give way weakness in the left lower extremity hip flexor and extensor ankle dorsiflexor plantar flexor 4/5 with give way weakness right hip flexor and extensor ankle dorsiflexor Normal strength in the right upper extremity.  Psychiatric: She has a normal mood and  affect.  Nursing note and vitals reviewed.   No evidence of swelling in the left upper extremity there is hypersensitivity to touch in all fingers as well as left wrist and left elbow. Less so at the shoulder No evidence of hypersensitivity in the right upper extremity. No evidence of swelling in the lower extremities. There is hypersensitivity to touch in the left lower extremity below the knee greater than the right side.      Assessment & Plan:  1. Reflex sympathetic dystrophy left upper extremity originally from a wrist injury. He was in 1994. Has had chronic pain in that distribution  2. Reflex sympathetic dystrophy bilateral lower extremities worse on the left side. Some improvement with cryoablation of the tibial  nerves however the result lasted only several weeks rather than 4 months as was hoped. This pain originated from the RSD at the wrist.  Normal 3. Polypharmacy we discussed her medication regimen. This has been formulated over time with dosage adjustments. We discussed how we reduced the morphine from high-dose long-acting and is now off of this completely. Now just taking hydrocodone 7.5 mg at night.  Patient is using lidocaine patch for more localized pain uses it in the same spots on a daily basis.  We'll continue Cymbalta 90 mg per day for now.  Continue tramadol ER 100 mg per day combined with 50 mg 3 times a day  Reduce ibuprofen from 600 mg 3 times per day to 400 mg 3 times per day, Goal is to stop this medicine which should help her GERD  Continue omeprazole 40 mg twice a day Continue lidocaine patch 3 patches one on the left wrist one on each foot on at at bedtime off 12 hours later Continue hydrocodone 7.5/325 daily at bedtime.  Follow-up in 1 month, Nurse practitioner  Will not repeat cryoablation due to limited duration of effect  Patient became tearful asking for any other type of treatments that might help her with her pain. We discussed referral to  psychology. She has had a negative experience with a patient support group in the past that turned into a "Pity party"

## 2015-04-29 ENCOUNTER — Encounter: Payer: Self-pay | Admitting: Genetic Counselor

## 2015-04-29 LAB — PMP ALCOHOL METABOLITE (ETG): Ethyl Glucuronide (EtG): NEGATIVE ng/mL

## 2015-05-01 LAB — OPIATES/OPIOIDS (LC/MS-MS)
Codeine Urine: NEGATIVE ng/mL (ref <50)
Hydrocodone: 1451 ng/mL (ref <50)
Hydromorphone: 282 ng/mL (ref <50)
Morphine Urine: NEGATIVE ng/mL (ref <50)
Norhydrocodone, Ur: 3755 ng/mL (ref <50)
Noroxycodone, Ur: NEGATIVE ng/mL (ref <50)
OXYMORPHONE, URINE: NEGATIVE ng/mL (ref <50)
Oxycodone, ur: NEGATIVE ng/mL (ref <50)

## 2015-05-01 LAB — PRESCRIPTION MONITORING PROFILE (SOLSTAS)
AMPHETAMINE/METH: NEGATIVE ng/mL
BUPRENORPHINE, URINE: NEGATIVE ng/mL
Barbiturate Screen, Urine: NEGATIVE ng/mL
Benzodiazepine Screen, Urine: NEGATIVE ng/mL
Cannabinoid Scrn, Ur: NEGATIVE ng/mL
Carisoprodol, Urine: NEGATIVE ng/mL
Cocaine Metabolites: NEGATIVE ng/mL
Creatinine, Urine: 207.3 mg/dL (ref >20.0)
ECSTASY: NEGATIVE ng/mL
Fentanyl, Ur: NEGATIVE ng/mL
MEPERIDINE UR: NEGATIVE ng/mL
Methadone Screen, Urine: NEGATIVE ng/mL
Nitrites, Initial: NEGATIVE ug/mL
Oxycodone Screen, Ur: NEGATIVE ng/mL
PROPOXYPHENE: NEGATIVE ng/mL
Tapentadol, urine: NEGATIVE ng/mL
Zolpidem, Urine: NEGATIVE ng/mL
pH, Initial: 6 pH (ref 4.5–8.9)

## 2015-05-01 LAB — TRAMADOL, URINE
N-DESMETHYL-CIS-TRAMADOL: 3100 ng/mL (ref <100)
Tramadol, Urine: 10749 ng/mL (ref <100)

## 2015-05-09 ENCOUNTER — Other Ambulatory Visit: Payer: Self-pay | Admitting: Physical Medicine & Rehabilitation

## 2015-05-12 ENCOUNTER — Telehealth: Payer: Self-pay | Admitting: *Deleted

## 2015-05-13 NOTE — Telephone Encounter (Signed)
[name] called about the letter that workman's comp is requiring.  He attorneys say they have not received.

## 2015-05-13 NOTE — Telephone Encounter (Signed)
Patient would like for Olean Ree to call her

## 2015-05-13 NOTE — Telephone Encounter (Signed)
Called patient and told her that we are working on the letter, however the information they are asking for is very in depth and it is taking a bit longer to gather all the information so patient can continue current care regiment.

## 2015-05-14 NOTE — Progress Notes (Signed)
Urine drug screen for this encounter is consistent for prescribed medication 

## 2015-05-15 ENCOUNTER — Other Ambulatory Visit: Payer: Self-pay | Admitting: Registered Nurse

## 2015-05-19 NOTE — Telephone Encounter (Signed)
Letter in review awaiting Dr. To sign

## 2015-05-20 NOTE — Telephone Encounter (Signed)
Letter is complete and will fax to [facility] office attention Christina Cancel [phone]

## 2015-05-26 ENCOUNTER — Encounter: Payer: Self-pay | Admitting: Registered Nurse

## 2015-05-26 ENCOUNTER — Encounter: Payer: Worker's Compensation | Attending: Physical Medicine & Rehabilitation | Admitting: Registered Nurse

## 2015-05-26 VITALS — BP 144/94 | HR 90 | Resp 14

## 2015-05-26 DIAGNOSIS — G90523 Complex regional pain syndrome I of lower limb, bilateral: Secondary | ICD-10-CM

## 2015-05-26 DIAGNOSIS — G894 Chronic pain syndrome: Secondary | ICD-10-CM

## 2015-05-26 DIAGNOSIS — Z5181 Encounter for therapeutic drug level monitoring: Secondary | ICD-10-CM | POA: Diagnosis not present

## 2015-05-26 DIAGNOSIS — Z79899 Other long term (current) drug therapy: Secondary | ICD-10-CM | POA: Diagnosis not present

## 2015-05-26 DIAGNOSIS — G90522 Complex regional pain syndrome I of left lower limb: Secondary | ICD-10-CM | POA: Insufficient documentation

## 2015-05-26 DIAGNOSIS — G90512 Complex regional pain syndrome I of left upper limb: Secondary | ICD-10-CM | POA: Diagnosis not present

## 2015-05-26 MED ORDER — HYDROCODONE-ACETAMINOPHEN 7.5-325 MG PO TABS: ORAL_TABLET | ORAL | Status: DC

## 2015-05-26 MED ORDER — TRAMADOL HCL ER 100 MG PO TB24: 100.0000 mg | ORAL_TABLET | Freq: Every day | ORAL | Status: DC

## 2015-05-26 NOTE — Progress Notes (Signed)
Subjective:    Patient ID: [name], female    DOB: 1966-07-10, 49 y.o.   MRN: [id number]  HPI: [name] is a 49 year old female who returns for follow up for chronic pain and medication refill. She says her pain is located in her neck, left arm, bilateral shoulders and bilateral lower extremities. She rates her pain 10. Her current exercise regime is pool therapy, stretching and  walking for short distances. Also states she had received 4-6 weeks of relief with cryo therapy.  Pain Inventory Average Pain 8 Pain Right Now 10 My pain is constant, sharp, burning, stabbing, tingling and aching  In the last 24 hours, has pain interfered with the following? General activity 0 Relation with others 0 Enjoyment of life 0 What TIME of day is your pain at its worst? ALL Sleep (in general) Poor  Pain is worse with: walking, bending, sitting, standing and some activites Pain improves with: rest, heat/ice, therapy/exercise and medication Relief from Meds: 3  Mobility walk without assistance how many minutes can you walk? 2-5 ability to climb steps?  no do you drive?  yes  Function disabled: date disabled 09/1993  Neuro/Psych bladder control problems weakness numbness tremor tingling trouble walking spasms depression anxiety  Prior Studies Any changes since last visit?  no  Physicians involved in your care Any changes since last visit?  no   Family History  Problem Relation Age of Onset  . Cancer Mother     lung  . Hypertension Mother   . Cancer Father     colon  . Hypertension Sister   . Cancer Sister   . Birth defects Maternal Grandmother     breast  . Birth defects Paternal Grandmother     uterine, stomach, lung  . Cancer Brother   . Hyperlipidemia Sister    History   Social History  . Marital Status: Married    Spouse Name: N/A  . Number of Children: 2  . Years of Education: N/A   Occupational History  . Disabled due to RSD     Social History Main Topics  . Smoking status: Never Smoker   . Smokeless tobacco: Never Used  . Alcohol Use: 0.0 oz/week    0 Standard drinks or equivalent per week     Comment: 01/24/2013 "drink or 2 once or /twice/year, if that"  . Drug Use: No  . Sexual Activity: Not Currently   Other Topics Concern  . None   Social History Narrative   Past Surgical History  Procedure Laterality Date  . Oophorectomy Right 2009  . Laparoscopic incisional / umbilical / ventral hernia repair  01/24/2013    IHR w/mesh/notes  . Hernia repair    . Tonsillectomy  1990's  . Nasal septum surgery  1980's?  . Appendectomy  ~ 06/2007  . Bladder repair  ~ 06/2007    "same day after bladder lift" (01/24/2013)  . Bladder suspension  ~ 06/2007  . Vaginal hysterectomy  ` 06/2007  . Diagnostic laparoscopy  1990's & ~ 2000    "I've had a couple; for endometrosis" (01/24/2013)  . Incisional hernia repair N/A 01/24/2013    Procedure: LAPAROSCOPIC INCISIONAL HERNIA;  Surgeon: Harl Bowie, MD;  Location: [place];  Service: General;  Laterality: N/A;  . Insertion of mesh N/A 01/24/2013    Procedure: INSERTION OF MESH;  Surgeon: Harl Bowie, MD;  Location: [place];  Service: General;  Laterality: N/A;   Past Medical History  Diagnosis Date  . Anxiety   . Asthma   . History of colonic polyps   . GERD (gastroesophageal reflux disease)   . Hypertension   . Allergy   . Hyperlipidemia   . ASCVD (arteriosclerotic cardiovascular disease)   . Obesity (BMI 30.0-34.9)   . Sleep apnea     NO MACHINE RECOMMENDED  . TIA (transient ischemic attack) ~ 2009  . Migraines   . Daily headache     "depends on the season" (01/24/2013)  . Arthritis     "just the norm" (01/24/2013)  . Fibromyalgia   . RSD (reflex sympathetic dystrophy)    BP 144/94 mmHg  Pulse 90  Resp 14  SpO2 96%  Opioid Risk Score:   Fall Risk Score:  `1  Depression screen PHQ 2/9  No flowsheet data found.   Review of Systems  HENT: Negative.    Eyes: Negative.   Respiratory: Negative.   Cardiovascular: Negative.   Gastrointestinal: Negative.   Endocrine: Negative.   Genitourinary:       Urinary leakage. Bladder control problems  Musculoskeletal: Positive for myalgias, back pain and arthralgias.  Skin: Negative.   Allergic/Immunologic: Negative.   Neurological: Positive for dizziness, tremors, weakness and numbness.       Tingling, trouble walking, spasms  Hematological: Negative.   Psychiatric/Behavioral: Positive for dysphoric mood. The patient is nervous/anxious.        Objective:   Physical Exam  Constitutional: She is oriented to person, place, and time. She appears well-developed and well-nourished.  HENT:  Head: Normocephalic and atraumatic.  Neck: Normal range of motion. Neck supple.  Cardiovascular: Normal rate and regular rhythm.   Pulmonary/Chest: Effort normal and breath sounds normal.  Musculoskeletal:  Normal Muscle Bulk and Muscle Testing Reveals: Upper Extremities: Right:Full ROM and Muscle Strength 5/5 Left: Decreased ROM: 90 Degrees and Muscle Strength 4/5 Thoracic Paraspinal Tenderness: T-1- T-7 Lower Extremities: Right Full ROM and Muscle Strength 5/5. Right Lower Extremity Flexion Produces Pain into Foot Left: Decreased ROM and Muscle Strength 4/5 Left Lower Extremity Flexion Produces pain into Left Foot and Ankle Arises from chair slowly Antalgic gait  Neurological: She is alert and oriented to person, place, and time.  Skin: Skin is warm and dry.  Psychiatric: She has a normal mood and affect.  Nursing note and vitals reviewed.         Assessment & Plan:  1. Complex Regional Pain Syndrome Type 1  Refilled: Hydro-codone 7.5/325mg one tablet at bedtime #30. Continue:Tramadol 100 mg ER daily #30 and Tramadol 50 mg one tablet by mouth TID #90.  2. Depression: Continue Cymbalta, recommended counseling. Denies at this time 3. Insomnia: Continue: Trazodone  20 minutes of face to face  patient care time was spent during this visit. All questions were encouraged and answered.   F/U in 1 month

## 2015-06-03 ENCOUNTER — Other Ambulatory Visit: Payer: Self-pay | Admitting: Physical Medicine & Rehabilitation

## 2015-06-11 ENCOUNTER — Other Ambulatory Visit: Payer: Self-pay | Admitting: Physical Medicine & Rehabilitation

## 2015-06-17 ENCOUNTER — Other Ambulatory Visit: Payer: Self-pay | Admitting: Physical Medicine & Rehabilitation

## 2015-06-24 ENCOUNTER — Other Ambulatory Visit: Payer: Self-pay | Admitting: Physical Medicine & Rehabilitation

## 2015-06-25 ENCOUNTER — Other Ambulatory Visit: Payer: Self-pay | Admitting: Physical Medicine & Rehabilitation

## 2015-06-25 ENCOUNTER — Encounter: Payer: Self-pay | Admitting: Registered Nurse

## 2015-06-25 ENCOUNTER — Encounter: Payer: Worker's Compensation | Attending: Physical Medicine & Rehabilitation | Admitting: Registered Nurse

## 2015-06-25 VITALS — BP 121/78 | HR 95

## 2015-06-25 DIAGNOSIS — G90512 Complex regional pain syndrome I of left upper limb: Secondary | ICD-10-CM

## 2015-06-25 DIAGNOSIS — Z5181 Encounter for therapeutic drug level monitoring: Secondary | ICD-10-CM

## 2015-06-25 DIAGNOSIS — G90522 Complex regional pain syndrome I of left lower limb: Secondary | ICD-10-CM | POA: Insufficient documentation

## 2015-06-25 DIAGNOSIS — G90523 Complex regional pain syndrome I of lower limb, bilateral: Secondary | ICD-10-CM | POA: Diagnosis not present

## 2015-06-25 DIAGNOSIS — G894 Chronic pain syndrome: Secondary | ICD-10-CM | POA: Diagnosis not present

## 2015-06-25 DIAGNOSIS — Z79899 Other long term (current) drug therapy: Secondary | ICD-10-CM

## 2015-06-25 MED ORDER — HYDROCODONE-ACETAMINOPHEN 7.5-325 MG PO TABS: ORAL_TABLET | ORAL | Status: DC

## 2015-06-25 MED ORDER — TRAMADOL HCL 50 MG PO TABS: 50.0000 mg | ORAL_TABLET | Freq: Three times a day (TID) | ORAL | Status: DC

## 2015-06-25 NOTE — Progress Notes (Signed)
Subjective:    Patient ID: [name], female    DOB: 1966/07/13, 49 y.o.   MRN: [id number]  HPI: [name] is a 49 year old female who returns for follow up for chronic pain and medication refill. She says her pain is located in her left arm, lower extremities and bilateral feet. She rates her pain 9. Her current exercise regime is pool therapy, stretching and walking for short distances.  Pain Inventory Average Pain 8 Pain Right Now 9 My pain is sharp, burning, stabbing, tingling and aching  In the last 24 hours, has pain interfered with the following? General activity 9 Relation with others 9 Enjoyment of life 9 What TIME of day is your pain at its worst? daytime, evening, night Sleep (in general) Poor  Pain is worse with: walking, bending, sitting, standing and unsure Pain improves with: rest, heat/ice, therapy/exercise, pacing activities, medication and TENS Relief from Meds: 7  Mobility walk without assistance ability to climb steps?  yes do you drive?  yes  Function not employed: date last employed . I need assistance with the following:  meal prep, household duties and shopping  Neuro/Psych bladder control problems weakness numbness tremor tingling trouble walking spasms dizziness depression anxiety  Prior Studies Any changes since last visit?  no  Physicians involved in your care Any changes since last visit?  no   Family History  Problem Relation Age of Onset  . Cancer Mother     lung  . Hypertension Mother   . Cancer Father     colon  . Hypertension Sister   . Cancer Sister   . Birth defects Maternal Grandmother     breast  . Birth defects Paternal Grandmother     uterine, stomach, lung  . Cancer Brother   . Hyperlipidemia Sister    Social History   Social History  . Marital Status: Married    Spouse Name: N/A  . Number of Children: 2  . Years of Education: N/A   Occupational History  . Disabled due to RSD     Social History Main Topics  . Smoking status: Never Smoker   . Smokeless tobacco: Never Used  . Alcohol Use: 0.0 oz/week    0 Standard drinks or equivalent per week     Comment: 01/24/2013 "drink or 2 once or /twice/year, if that"  . Drug Use: No  . Sexual Activity: Not Currently   Other Topics Concern  . None   Social History Narrative   Past Surgical History  Procedure Laterality Date  . Oophorectomy Right 2009  . Laparoscopic incisional / umbilical / ventral hernia repair  01/24/2013    IHR w/mesh/notes  . Hernia repair    . Tonsillectomy  1990's  . Nasal septum surgery  1980's?  . Appendectomy  ~ 06/2007  . Bladder repair  ~ 06/2007    "same day after bladder lift" (01/24/2013)  . Bladder suspension  ~ 06/2007  . Vaginal hysterectomy  ` 06/2007  . Diagnostic laparoscopy  1990's & ~ 2000    "I've had a couple; for endometrosis" (01/24/2013)  . Incisional hernia repair N/A 01/24/2013    Procedure: LAPAROSCOPIC INCISIONAL HERNIA;  Surgeon: Shelly Rubenstein, MD;  Location: [facility];  Service: General;  Laterality: N/A;  . Insertion of mesh N/A 01/24/2013    Procedure: INSERTION OF MESH;  Surgeon: Shelly Rubenstein, MD;  Location: [facility];  Service: General;  Laterality: N/A;   Past Medical History  Diagnosis  Date  . Anxiety   . Asthma   . History of colonic polyps   . GERD (gastroesophageal reflux disease)   . Hypertension   . Allergy   . Hyperlipidemia   . ASCVD (arteriosclerotic cardiovascular disease)   . Obesity (BMI 30.0-34.9)   . Sleep apnea     NO MACHINE RECOMMENDED  . TIA (transient ischemic attack) ~ 2009  . Migraines   . Daily headache     "depends on the season" (01/24/2013)  . Arthritis     "just the norm" (01/24/2013)  . Fibromyalgia   . RSD (reflex sympathetic dystrophy)    BP 121/78 mmHg  Pulse 95  SpO2 95%  Opioid Risk Score:   Fall Risk Score:  `1  Depression screen PHQ 2/9  Depression screen PHQ 2/9 06/25/2015  Decreased Interest 3  Down, Depressed,  Hopeless 2  PHQ - 2 Score 5  Altered sleeping 3  Tired, decreased energy 3  Change in appetite 2  Feeling bad or failure about yourself  3  Trouble concentrating 2  Moving slowly or fidgety/restless 0  Suicidal thoughts 1  PHQ-9 Score 19  Difficult doing work/chores Somewhat difficult     Review of Systems  Constitutional: Positive for diaphoresis.  Respiratory: Positive for cough and shortness of breath.   Gastrointestinal: Positive for diarrhea and constipation.  Genitourinary: Positive for dysuria and difficulty urinating.  Skin: Positive for rash.  Neurological: Positive for dizziness, tremors, weakness and numbness.       Tingling Spasms   Psychiatric/Behavioral: Positive for dysphoric mood. The patient is nervous/anxious.   All other systems reviewed and are negative.      Objective:   Physical Exam  Constitutional: She is oriented to person, place, and time. She appears well-developed and well-nourished.  HENT:  Head: Normocephalic and atraumatic.  Neck: Normal range of motion. Neck supple.  Cardiovascular: Normal rate and regular rhythm.   Pulmonary/Chest: Effort normal and breath sounds normal.  Musculoskeletal:  Normal Muscle Bulk and Muscle Testing Reveals: Upper Extremities: Right:Full ROM and Muscle Strength  4/5 Left: Decreased ROM 90 Degrees and Muscle Strength 2/5 Lower Extremities: Full ROM and Muscle Strength 4/5 Bilateral Lower Extremity Flexion Produces Pain into Bilateral Feet Arises from chair slowly Antalgic Gait  Neurological: She is alert and oriented to person, place, and time.  Skin: Skin is warm and dry.  Psychiatric: She has a normal mood and affect.  Nursing note and vitals reviewed.         Assessment & Plan:  1. Complex Regional Pain Syndrome Type 1  Refilled: Hydro-codone 7.5/325mg  one tablet at bedtime #30. Continue:Tramadol 100 mg ER daily #30 and Tramadol 50 mg one tablet by mouth TID #90.  2. Depression: Continue  Cymbalta, recommended counseling. Denies at this time 3. Insomnia: Continue: Trazodone  20 minutes of face to face patient care time was spent during this visit. All questions were encouraged and answered.   F/U in 1 month

## 2015-07-15 ENCOUNTER — Other Ambulatory Visit: Payer: Self-pay | Admitting: Registered Nurse

## 2015-07-23 ENCOUNTER — Encounter: Payer: Self-pay | Admitting: Registered Nurse

## 2015-07-23 ENCOUNTER — Encounter: Payer: Worker's Compensation | Attending: Physical Medicine & Rehabilitation | Admitting: Registered Nurse

## 2015-07-23 VITALS — BP 132/90 | HR 92

## 2015-07-23 DIAGNOSIS — G90512 Complex regional pain syndrome I of left upper limb: Secondary | ICD-10-CM

## 2015-07-23 DIAGNOSIS — G894 Chronic pain syndrome: Secondary | ICD-10-CM

## 2015-07-23 DIAGNOSIS — Z5181 Encounter for therapeutic drug level monitoring: Secondary | ICD-10-CM

## 2015-07-23 DIAGNOSIS — G90523 Complex regional pain syndrome I of lower limb, bilateral: Secondary | ICD-10-CM | POA: Diagnosis not present

## 2015-07-23 DIAGNOSIS — G90522 Complex regional pain syndrome I of left lower limb: Secondary | ICD-10-CM | POA: Insufficient documentation

## 2015-07-23 DIAGNOSIS — Z79899 Other long term (current) drug therapy: Secondary | ICD-10-CM

## 2015-07-23 MED ORDER — HYDROCODONE-ACETAMINOPHEN 7.5-325 MG PO TABS: ORAL_TABLET | ORAL | Status: DC

## 2015-07-23 NOTE — Progress Notes (Signed)
Subjective:    Patient ID: [name], female    DOB: [birth year], 49 y.o.   MRN: [id number]  HPI:[name] is a 49 year old female who returns for follow up for chronic pain and medication refill. She says her pain is located in her neck,left arm, upper-back and bilateral feet. She rates her pain 8. Her current exercise regime is walking for short distances.   Pain Inventory Average Pain 9 Pain Right Now 8 My pain is sharp, burning, stabbing, tingling and aching  In the last 24 hours, has pain interfered with the following? General activity 10 Relation with others 10 Enjoyment of life 10 What TIME of day is your pain at its worst? all Sleep (in general) Poor  Pain is worse with: na Pain improves with: na Relief from Meds: na  Mobility walk without assistance ability to climb steps?  yes do you drive?  yes  Function disabled: date disabled . I need assistance with the following:  meal prep, household duties and shopping  Neuro/Psych bladder control problems tremor tingling trouble walking spasms dizziness depression anxiety  Prior Studies Any changes since last visit?  no  Physicians involved in your care Any changes since last visit?  no   Family History  Problem Relation Age of Onset  . Cancer Mother     lung  . Hypertension Mother   . Cancer Father     colon  . Hypertension Sister   . Cancer Sister   . Birth defects Maternal Grandmother     breast  . Birth defects Paternal Grandmother     uterine, stomach, lung  . Cancer Brother   . Hyperlipidemia Sister    Social History   Social History  . Marital Status: Married    Spouse Name: N/A  . Number of Children: 2  . Years of Education: N/A   Occupational History  . Disabled due to RSD    Social History Main Topics  . Smoking status: Never Smoker   . Smokeless tobacco: Never Used  . Alcohol Use: 0.0 oz/week    0 Standard drinks or equivalent per week     Comment: 01/24/2013  "drink or 2 once or /twice/year, if that"  . Drug Use: No  . Sexual Activity: Not Currently   Other Topics Concern  . None   Social History Narrative   Past Surgical History  Procedure Laterality Date  . Oophorectomy Right 2009  . Laparoscopic incisional / umbilical / ventral hernia repair  01/24/2013    IHR w/mesh/notes  . Hernia repair    . Tonsillectomy  1990's  . Nasal septum surgery  1980's?  . Appendectomy  ~ 06/2007  . Bladder repair  ~ 06/2007    "same day after bladder lift" (01/24/2013)  . Bladder suspension  ~ 06/2007  . Vaginal hysterectomy  ` 06/2007  . Diagnostic laparoscopy  1990's & ~ 2000    "I've had a couple; for endometrosis" (01/24/2013)  . Incisional hernia repair N/A 01/24/2013    Procedure: LAPAROSCOPIC INCISIONAL HERNIA;  Surgeon: Shelly Rubenstein, MD;  Location: [facility];  Service: General;  Laterality: N/A;  . Insertion of mesh N/A 01/24/2013    Procedure: INSERTION OF MESH;  Surgeon: Shelly Rubenstein, MD;  Location: [facility];  Service: General;  Laterality: N/A;   Past Medical History  Diagnosis Date  . Anxiety   . Asthma   . History of colonic polyps   . GERD (gastroesophageal reflux disease)   .  Hypertension   . Allergy   . Hyperlipidemia   . ASCVD (arteriosclerotic cardiovascular disease)   . Obesity (BMI 30.0-34.9)   . Sleep apnea     NO MACHINE RECOMMENDED  . TIA (transient ischemic attack) ~ 2009  . Migraines   . Daily headache     "depends on the season" (01/24/2013)  . Arthritis     "just the norm" (01/24/2013)  . Fibromyalgia   . RSD (reflex sympathetic dystrophy)    BP 132/90 mmHg  Pulse 92  SpO2 95%  Opioid Risk Score:   Fall Risk Score:  `1  Depression screen PHQ 2/9  Depression screen PHQ 2/9 06/25/2015  Decreased Interest 3  Down, Depressed, Hopeless 2  PHQ - 2 Score 5  Altered sleeping 3  Tired, decreased energy 3  Change in appetite 2  Feeling bad or failure about yourself  3  Trouble concentrating 2  Moving slowly or  fidgety/restless 0  Suicidal thoughts 1  PHQ-9 Score 19  Difficult doing work/chores Somewhat difficult     Review of Systems  Constitutional: Positive for diaphoresis.  Respiratory: Positive for cough, shortness of breath and wheezing.   Cardiovascular: Positive for leg swelling.  Gastrointestinal: Positive for diarrhea and constipation.  Musculoskeletal: Positive for gait problem.  Skin: Positive for rash.  Neurological: Positive for dizziness, tremors and numbness.       Tingling Spasms   Psychiatric/Behavioral: Positive for dysphoric mood. The patient is nervous/anxious.   All other systems reviewed and are negative.      Objective:   Physical Exam  Constitutional: She is oriented to person, place, and time. She appears well-developed and well-nourished.  HENT:  Head: Normocephalic and atraumatic.  Neck: Normal range of motion. Neck supple.  Cervical Paraspinal Tenderness: C-5- C-6  Cardiovascular: Normal rate and regular rhythm.   Pulmonary/Chest: Effort normal and breath sounds normal.  Musculoskeletal:  Normal Muscle Bulk and Muscle Testing Reveals: Upper Extremities: Right: Full ROM and Muscle Strength 5/5 Left: Decreased ROM 45 Degrees and Muscle Strength 2/5 Thoracic Paraspinal Tenderness: T-1- T-3 Mainly Left Side Thoracic Paraspinal Tenderness: T-10- T-12 Lower Extremities: Full ROM and Muscle Strength 5/5 Bilateral Lower Extremities Flexion Produces Pain into Bilateral Feet and Lower Extremities ( shin to bilateral feet) Arises from chair slowly Antalgic Gait  Neurological: She is alert and oriented to person, place, and time.  Skin: Skin is warm and dry.  Psychiatric: She has a normal mood and affect.  Nursing note and vitals reviewed.         Assessment & Plan:  1. Complex Regional Pain Syndrome Type 1  Refilled: Hydro-codone 7.5/325mg  one tablet at bedtime #30. Continue:Tramadol 100 mg ER daily #30 and Tramadol 50 mg one tablet by mouth TID #90.   2. Depression: Continue Cymbalta, recommended counseling. Denies at this time 3. Insomnia: Continue: Trazodone.  ( Take 1/2 to one tablet at HS)  20 minutes of face to face patient care time was spent during this visit. All questions were encouraged and answered.   F/U in 1 month

## 2015-08-10 ENCOUNTER — Other Ambulatory Visit: Payer: Self-pay | Admitting: Physical Medicine & Rehabilitation

## 2015-08-18 ENCOUNTER — Other Ambulatory Visit: Payer: Self-pay | Admitting: Physical Medicine & Rehabilitation

## 2015-08-21 ENCOUNTER — Encounter: Payer: Worker's Compensation | Attending: Physical Medicine & Rehabilitation | Admitting: Registered Nurse

## 2015-08-21 ENCOUNTER — Encounter: Payer: Self-pay | Admitting: Registered Nurse

## 2015-08-21 VITALS — BP 141/81 | HR 111

## 2015-08-21 DIAGNOSIS — G90522 Complex regional pain syndrome I of left lower limb: Secondary | ICD-10-CM | POA: Diagnosis present

## 2015-08-21 DIAGNOSIS — G90523 Complex regional pain syndrome I of lower limb, bilateral: Secondary | ICD-10-CM

## 2015-08-21 DIAGNOSIS — F329 Major depressive disorder, single episode, unspecified: Secondary | ICD-10-CM

## 2015-08-21 DIAGNOSIS — Z79899 Other long term (current) drug therapy: Secondary | ICD-10-CM

## 2015-08-21 DIAGNOSIS — G90512 Complex regional pain syndrome I of left upper limb: Secondary | ICD-10-CM | POA: Diagnosis not present

## 2015-08-21 DIAGNOSIS — Z5181 Encounter for therapeutic drug level monitoring: Secondary | ICD-10-CM | POA: Diagnosis not present

## 2015-08-21 DIAGNOSIS — G894 Chronic pain syndrome: Secondary | ICD-10-CM | POA: Diagnosis not present

## 2015-08-21 DIAGNOSIS — F32A Depression, unspecified: Secondary | ICD-10-CM

## 2015-08-21 MED ORDER — HYDROCODONE-ACETAMINOPHEN 7.5-325 MG PO TABS: ORAL_TABLET | ORAL | Status: DC

## 2015-08-21 NOTE — Progress Notes (Signed)
Subjective:    Patient ID: [name], female    DOB: Feb 23, 1966, 49 y.o.   MRN: [id number]  HPI: [name] is a 49 year old female who returns for follow up for chronic pain and medication refill. She says her pain is located in her neck,left arm, upper-back and bilateral feet. She rates her pain 10. Her current exercise regime is walking for short distances in her home. Also states "the pain is is getting worse and she has no quality of life". She had BilateralTibial Nerve Block with peripheral nerve stimulator guidance and Cryoablation with several weeks of relief. I spoke with Dr. Wynn Banker she can obtain a second opinion at [facility] this was explain to [name] she verbalizes understanding. Referral placed today.  Pain Inventory Average Pain 8 Pain Right Now 10 My pain is constant, sharp, burning, dull, stabbing, tingling and aching  In the last 24 hours, has pain interfered with the following? General activity 2 Relation with others 2 Enjoyment of life 2 What TIME of day is your pain at its worst? Morning, Daytime, Evening and Night Sleep (in general) Poor  Pain is worse with: walking, bending, sitting, standing and some activites Pain improves with: rest, heat/ice, therapy/exercise, pacing activities, medication and injections Relief from Meds: NA  Mobility walk without assistance walk with assistance ability to climb steps?  yes do you drive?  yes  Function disabled: date disabled 09/1993 I need assistance with the following:  meal prep, household duties and shopping  Neuro/Psych bladder control problems weakness numbness tremor tingling trouble walking spasms dizziness depression anxiety  Prior Studies Any changes since last visit?  no  Physicians involved in your care Any changes since last visit?  no   Family History  Problem Relation Age of Onset  . Cancer Mother     lung  . Hypertension Mother   . Cancer  Father     colon  . Hypertension Sister   . Cancer Sister   . Birth defects Maternal Grandmother     breast  . Birth defects Paternal Grandmother     uterine, stomach, lung  . Cancer Brother   . Hyperlipidemia Sister    Social History   Social History  . Marital Status: Married    Spouse Name: N/A  . Number of Children: 2  . Years of Education: N/A   Occupational History  . Disabled due to RSD    Social History Main Topics  . Smoking status: Never Smoker   . Smokeless tobacco: Never Used  . Alcohol Use: 0.0 oz/week    0 Standard drinks or equivalent per week     Comment: 01/24/2013 "drink or 2 once or /twice/year, if that"  . Drug Use: No  . Sexual Activity: Not Currently   Other Topics Concern  . None   Social History Narrative   Past Surgical History  Procedure Laterality Date  . Oophorectomy Right 2009  . Laparoscopic incisional / umbilical / ventral hernia repair  01/24/2013    IHR w/mesh/notes  . Hernia repair    . Tonsillectomy  1990's  . Nasal septum surgery  1980's?  . Appendectomy  ~ 06/2007  . Bladder repair  ~ 06/2007    "same day after bladder lift" (01/24/2013)  . Bladder suspension  ~ 06/2007  . Vaginal hysterectomy  ` 06/2007  . Diagnostic laparoscopy  1990's & ~ 2000    "I've had a couple; for endometrosis" (01/24/2013)  . Incisional hernia  repair N/A 01/24/2013    Procedure: LAPAROSCOPIC INCISIONAL HERNIA;  Surgeon: Shelly Rubenstein, MD;  Location: [facility];  Service: General;  Laterality: N/A;  . Insertion of mesh N/A 01/24/2013    Procedure: INSERTION OF MESH;  Surgeon: Shelly Rubenstein, MD;  Location: [facility];  Service: General;  Laterality: N/A;   Past Medical History  Diagnosis Date  . Anxiety   . Asthma   . History of colonic polyps   . GERD (gastroesophageal reflux disease)   . Hypertension   . Allergy   . Hyperlipidemia   . ASCVD (arteriosclerotic cardiovascular disease)   . Obesity (BMI 30.0-34.9)   . Sleep apnea     NO MACHINE RECOMMENDED    . TIA (transient ischemic attack) ~ 2009  . Migraines   . Daily headache     "depends on the season" (01/24/2013)  . Arthritis     "just the norm" (01/24/2013)  . Fibromyalgia   . RSD (reflex sympathetic dystrophy)    BP 141/81 mmHg  Pulse 111  SpO2 97%  Opioid Risk Score:   Fall Risk Score:  `1  Depression screen PHQ 2/9  Depression screen PHQ 2/9 06/25/2015  Decreased Interest 3  Down, Depressed, Hopeless 2  PHQ - 2 Score 5  Altered sleeping 3  Tired, decreased energy 3  Change in appetite 2  Feeling bad or failure about yourself  3  Trouble concentrating 2  Moving slowly or fidgety/restless 0  Suicidal thoughts 1  PHQ-9 Score 19  Difficult doing work/chores Somewhat difficult      Review of Systems  Constitutional: Positive for diaphoresis and unexpected weight change.  Respiratory: Positive for apnea, cough and shortness of breath.   Gastrointestinal: Positive for diarrhea and constipation.  Genitourinary:       Bladder control problems  Musculoskeletal:       Spasms  Skin: Positive for rash.  Neurological: Positive for dizziness, tremors, weakness and numbness.       Tingling Gait Instability  Psychiatric/Behavioral: The patient is nervous/anxious.        Depression   All other systems reviewed and are negative.      Objective:   Physical Exam  Constitutional: She is oriented to person, place, and time. She appears well-developed and well-nourished.  HENT:  Head: Normocephalic and atraumatic.  Neck: Normal range of motion. Neck supple.  Cardiovascular: Normal rate and regular rhythm.   Pulmonary/Chest: Effort normal and breath sounds normal.  Musculoskeletal:  Normal Muscle Bulk and Muscle Testing Reveals: Upper Extremities: Right: Full ROM and Muscle Strength 5/5 Left: Decreased ROM: 45 Degrees and Muscle Strength 3/5 Thoracic Hypersensitivity: T-1- T-7 Lower Extremities: Full ROM and Muscle Strength: Bilateral Lower Extremities Flexion Produces  Pain into lower extremities and Bilateral Feet Antalgic Gait   Neurological: She is alert and oriented to person, place, and time.  Skin: Skin is warm and dry.  Psychiatric: She has a normal mood and affect.  Nursing note and vitals reviewed.         Assessment & Plan:  1. Complex Regional Pain Syndrome Type 1  Refilled: Hydro-codone 7.5/325mg  one tablet at bedtime #30. Continue:Tramadol 100 mg ER daily #30 and Tramadol 50 mg one tablet by mouth TID #90.  2. Depression: Continue Cymbalta, recommended counseling. No Insurance. Referral [facility] 3. Insomnia: Continue: Trazodone.  ( Take 1/2 to one tablet at HS)  20 minutes of face to face patient care time was spent during this visit. All questions were encouraged and  answered.   F/U in 1 month

## 2015-09-03 NOTE — Progress Notes (Signed)
Urine drug screen for this encounter is consistent for prescribed medication 

## 2015-09-15 ENCOUNTER — Other Ambulatory Visit: Payer: Self-pay | Admitting: Registered Nurse

## 2015-09-16 ENCOUNTER — Other Ambulatory Visit: Payer: Self-pay | Admitting: Registered Nurse

## 2015-09-18 ENCOUNTER — Ambulatory Visit: Payer: Self-pay | Admitting: Registered Nurse

## 2015-09-19 ENCOUNTER — Ambulatory Visit: Payer: Self-pay | Admitting: Registered Nurse

## 2015-09-22 ENCOUNTER — Encounter: Payer: Worker's Compensation | Attending: Physical Medicine & Rehabilitation | Admitting: Registered Nurse

## 2015-09-22 ENCOUNTER — Encounter: Payer: Self-pay | Admitting: Registered Nurse

## 2015-09-22 VITALS — BP 122/73 | HR 88 | Resp 14

## 2015-09-22 DIAGNOSIS — F329 Major depressive disorder, single episode, unspecified: Secondary | ICD-10-CM

## 2015-09-22 DIAGNOSIS — Z79899 Other long term (current) drug therapy: Secondary | ICD-10-CM

## 2015-09-22 DIAGNOSIS — G90512 Complex regional pain syndrome I of left upper limb: Secondary | ICD-10-CM

## 2015-09-22 DIAGNOSIS — G894 Chronic pain syndrome: Secondary | ICD-10-CM

## 2015-09-22 DIAGNOSIS — G90522 Complex regional pain syndrome I of left lower limb: Secondary | ICD-10-CM | POA: Insufficient documentation

## 2015-09-22 DIAGNOSIS — F32A Depression, unspecified: Secondary | ICD-10-CM

## 2015-09-22 DIAGNOSIS — G90523 Complex regional pain syndrome I of lower limb, bilateral: Secondary | ICD-10-CM

## 2015-09-22 DIAGNOSIS — Z5181 Encounter for therapeutic drug level monitoring: Secondary | ICD-10-CM

## 2015-09-22 MED ORDER — HYDROCODONE-ACETAMINOPHEN 7.5-325 MG PO TABS: ORAL_TABLET | ORAL | Status: DC

## 2015-09-22 MED ORDER — TRAZODONE HCL 50 MG PO TABS
50.0000 mg | ORAL_TABLET | Freq: Every day | ORAL | Status: DC
Start: 2015-09-22 — End: 2015-12-22

## 2015-09-22 NOTE — Progress Notes (Addendum)
Subjective:    Patient ID: [name], female    DOB: 1966/06/04, 49 y.o.   MRN: [id number]  HPI: [name] is a 49 year old female who returns for follow up for chronic pain and medication refill. She says her pain is located in her neck, bilateral shoulder's,left arm and bilateral ankles and feet. Also states her pain has intensified and she asked if she could take her Hydrocodone twice a day.Instructed to decrease her Tramadol 50 mg to BID (afternoon and evening). We will prescribe her Hydrocodone twice a day one in the morning and one at bedtime and we will re-evaluate next month. She verbalizes understanding. Discuss with Dr. Wynn Banker he agrees with plan. Also complaining of Insomnia, Trazodone 25 mg ineffective we will increase her  Trazodone to 50 mg at bedtime. She verbalizes understanding. She rates her pain 9. Her current exercise regime is walking for short distances in her home. Also states her father has Cancer and he lives in [place] undergoing chemotherapy, she maybe leaving to stay in [place] for a few weeks. Instructed to call office when her plans are solidified she verbalizes    Pain Inventory Average Pain 7 Pain Right Now 9 My pain is constant, sharp, burning, stabbing, tingling and aching  In the last 24 hours, has pain interfered with the following? General activity 4 Relation with others 4 Enjoyment of life 3 What TIME of day is your pain at its worst? all Sleep (in general) Poor  Pain is worse with: walking, bending, sitting and standing Pain improves with: rest, therapy/exercise, pacing activities, medication and injections Relief from Meds: 6  Mobility walk without assistance ability to climb steps?  yes do you drive?  yes  Function disabled: date disabled . I need assistance with the following:  meal prep, household duties and shopping  Neuro/Psych bladder control problems weakness numbness tremor tingling trouble  walking spasms dizziness depression anxiety  Prior Studies Any changes since last visit?  no  Physicians involved in your care Any changes since last visit?  no   Family History  Problem Relation Age of Onset  . Cancer Mother     lung  . Hypertension Mother   . Cancer Father     colon  . Hypertension Sister   . Cancer Sister   . Birth defects Maternal Grandmother     breast  . Birth defects Paternal Grandmother     uterine, stomach, lung  . Cancer Brother   . Hyperlipidemia Sister    Social History   Social History  . Marital Status: Married    Spouse Name: N/A  . Number of Children: 2  . Years of Education: N/A   Occupational History  . Disabled due to RSD    Social History Main Topics  . Smoking status: Never Smoker   . Smokeless tobacco: Never Used  . Alcohol Use: 0.0 oz/week    0 Standard drinks or equivalent per week     Comment: 01/24/2013 "drink or 2 once or /twice/year, if that"  . Drug Use: No  . Sexual Activity: Not Currently   Other Topics Concern  . None   Social History Narrative   Past Surgical History  Procedure Laterality Date  . Oophorectomy Right 2009  . Laparoscopic incisional / umbilical / ventral hernia repair  01/24/2013    IHR w/mesh/notes  . Hernia repair    . Tonsillectomy  1990's  . Nasal septum surgery  1980's?  . Appendectomy  ~  06/2007  . Bladder repair  ~ 06/2007    "same day after bladder lift" (01/24/2013)  . Bladder suspension  ~ 06/2007  . Vaginal hysterectomy  ` 06/2007  . Diagnostic laparoscopy  1990's & ~ 2000    "I've had a couple; for endometrosis" (01/24/2013)  . Incisional hernia repair N/A 01/24/2013    Procedure: LAPAROSCOPIC INCISIONAL HERNIA;  Surgeon: Shelly Rubensteinouglas A Blackman, MD;  Location: [facility];  Service: General;  Laterality: N/A;  . Insertion of mesh N/A 01/24/2013    Procedure: INSERTION OF MESH;  Surgeon: Shelly Rubensteinouglas A Blackman, MD;  Location: [facility];  Service: General;  Laterality: N/A;   Past Medical History   Diagnosis Date  . Anxiety   . Asthma   . History of colonic polyps   . GERD (gastroesophageal reflux disease)   . Hypertension   . Allergy   . Hyperlipidemia   . ASCVD (arteriosclerotic cardiovascular disease)   . Obesity (BMI 30.0-34.9)   . Sleep apnea     NO MACHINE RECOMMENDED  . TIA (transient ischemic attack) ~ 2009  . Migraines   . Daily headache     "depends on the season" (01/24/2013)  . Arthritis     "just the norm" (01/24/2013)  . Fibromyalgia   . RSD (reflex sympathetic dystrophy)    BP 122/73 mmHg  Pulse 88  Resp 14  SpO2 94%  Opioid Risk Score:   Fall Risk Score:  `1  Depression screen PHQ 2/9  Depression screen PHQ 2/9 06/25/2015  Decreased Interest 3  Down, Depressed, Hopeless 2  PHQ - 2 Score 5  Altered sleeping 3  Tired, decreased energy 3  Change in appetite 2  Feeling bad or failure about yourself  3  Trouble concentrating 2  Moving slowly or fidgety/restless 0  Suicidal thoughts 1  PHQ-9 Score 19  Difficult doing work/chores Somewhat difficult     Review of Systems  Constitutional: Positive for diaphoresis.  Respiratory: Positive for apnea, cough and shortness of breath.   Cardiovascular: Positive for leg swelling.  Gastrointestinal: Positive for abdominal pain, diarrhea and constipation.  Endocrine:       High blood sugar  Genitourinary:       Incontinence  Musculoskeletal: Positive for joint swelling and gait problem.       Spasms  Skin: Positive for rash.  Neurological: Positive for dizziness, tremors, weakness and numbness.       Tingling  Psychiatric/Behavioral: Positive for dysphoric mood. The patient is nervous/anxious.   All other systems reviewed and are negative.      Objective:   Physical Exam  Constitutional: She is oriented to person, place, and time. She appears well-developed and well-nourished.  HENT:  Head: Normocephalic and atraumatic.  Neck: Normal range of motion. Neck supple.  Cardiovascular: Normal rate and  regular rhythm.   Pulmonary/Chest: Effort normal and breath sounds normal.  Musculoskeletal:  Normal Muscle Bulk and Muscle Testing Reveals: Upper Extremities: Right: Full ROM and Muscle Strength 5/5 Left: Decreased ROM 90 Degrees and Muscle Strength 3/5 Thoracic and Lumbar Hypersensitivity Lower Extremities: Decreased ROM and Bilateral Lower Extremity Flexion Produces Pain into Bilateral Feet Arises from chair slowly Antalgic gait  Neurological: She is alert and oriented to person, place, and time.  Skin: Skin is warm and dry.  Psychiatric: She has a normal mood and affect.  Nursing note and vitals reviewed.         Assessment & Plan:  1. Complex Regional Pain Syndrome Type 1  RX:  Increased Hydro-codone 7.5/325mg  one tablet twice a day one in the morning and one at bedtime #60. Continue:Tramadol 100 mg ER daily at bedtime #30 and Tramadol 50 mg Decreased to  one tablet by mouth BID. One tablet in the afternoon and one tablet in the evening. 2. Depression: Continue Cymbalta, recommended counseling.  Referral [facility]: Instructed to call [facility] [phone] 3. Insomnia:  Trazodone  Increased to 50 mg HS.   20 minutes of face to face patient care time was spent during this visit. All questions were encouraged and answered.   F/U in 1 month

## 2015-09-22 NOTE — Patient Instructions (Addendum)
On 09/23/2015   Take Tramadol 50 mg twice a day afternoon and evening.  Start Hydrocodone 7.5 mg Twice a day on 09/23/2015. Will re-evaluate next month  [facility] [phone]   [facility]: [phone]

## 2015-09-23 ENCOUNTER — Telehealth: Payer: Self-pay | Admitting: Physical Medicine & Rehabilitation

## 2015-09-23 NOTE — Telephone Encounter (Signed)
Pt has called stating that NP increased trazadone - insurance will not pay for it because it is a depression medication - pt claims to be given this medication for sleep - pt needs a letter to be sent to the insurance company as to why the medication has been increased

## 2015-09-25 ENCOUNTER — Telehealth: Payer: Self-pay | Admitting: Physical Medicine & Rehabilitation

## 2015-09-25 ENCOUNTER — Telehealth: Payer: Self-pay | Admitting: Registered Nurse

## 2015-09-25 MED ORDER — HYDROCODONE-ACETAMINOPHEN 7.5-325 MG PO TABS: ORAL_TABLET | ORAL | Status: DC

## 2015-09-25 NOTE — Telephone Encounter (Signed)
Pt is going to [facility] and will needs prescriptions to carry her through her trip - pt was told to call NP if this happened

## 2015-09-25 NOTE — Telephone Encounter (Signed)
Spoke with Mrs. Suzanne, [name], She will be leaving for [place] on 09/29/2015, her father has Cancer and it has  Metastasize. She will be staying in [place] for two months. She spoke with her Teacher, early years/pre she will be able to obtain her Hydrocodone in [place].  Prescription printed for the following month. She verbalizes understanding.

## 2015-09-25 NOTE — Telephone Encounter (Signed)
Prescription printed for the following month and post dated.

## 2015-09-25 NOTE — Telephone Encounter (Signed)
Please advise on refills? Thanks!

## 2015-09-25 NOTE — Telephone Encounter (Signed)
Placed a call to Mrs. Foster Simpson, no answer left message to return the call.

## 2015-10-16 ENCOUNTER — Other Ambulatory Visit: Payer: Self-pay | Admitting: Physical Medicine & Rehabilitation

## 2015-10-17 ENCOUNTER — Other Ambulatory Visit: Payer: Self-pay | Admitting: *Deleted

## 2015-10-17 MED ORDER — IBUPROFEN 400 MG PO TABS: 400.0000 mg | ORAL_TABLET | Freq: Three times a day (TID) | ORAL | Status: DC

## 2015-10-17 NOTE — Telephone Encounter (Signed)
Verified by Dr Wynn BankerKIrsteins note October note that ibuprofen decreased to 400 mg tid.  Refil sent to pharmacy per request of Vernona RiegerLaura and old order for 600 mg tid removed from med list.

## 2015-10-21 ENCOUNTER — Encounter: Payer: Worker's Compensation | Admitting: Registered Nurse

## 2015-11-05 ENCOUNTER — Telehealth: Payer: Self-pay | Admitting: Internal Medicine

## 2015-11-05 NOTE — Telephone Encounter (Signed)
[facility]  [address]  Pt called to see if dr Silvio Pate could call her in something for her nerves She stated her father passed away last night Pt was very upset and crying on the phone Please advise pt

## 2015-11-05 NOTE — Telephone Encounter (Signed)
Okay to send Rx for alprazolam 0.25mg   1/2-1 tid prn for nerves #20 x 0  Let her know this is a relative of valium which she had problems with--she should watch for any issues Let her know I am sorry for her loss

## 2015-11-05 NOTE — Telephone Encounter (Signed)
Spoke with patient and she would like something else called in besides xanax, pt states she does not want to have a reaction. Please advise

## 2015-11-06 MED ORDER — HYDROXYZINE HCL 25 MG PO TABS: ORAL_TABLET | ORAL | Status: DC

## 2015-11-06 NOTE — Telephone Encounter (Signed)
Okay Send generic vistaril (hydroxyzine)  1-2 tid prn nerves #60 x 0

## 2015-11-06 NOTE — Telephone Encounter (Signed)
done

## 2015-11-06 NOTE — Addendum Note (Signed)
Addended by: Shon Millet on: 11/06/2015 02:13 PM   Modules accepted: Orders

## 2015-11-10 ENCOUNTER — Telehealth: Payer: Self-pay | Admitting: *Deleted

## 2015-11-10 MED ORDER — TRAMADOL HCL ER 100 MG PO TB24: 100.0000 mg | ORAL_TABLET | Freq: Every day | ORAL | Status: DC

## 2015-11-10 MED ORDER — TRAMADOL HCL 50 MG PO TABS: 50.0000 mg | ORAL_TABLET | Freq: Three times a day (TID) | ORAL | Status: DC

## 2015-11-10 NOTE — Telephone Encounter (Signed)
Needs tramadol rx's refilled, rx's called into preferred pharmacy

## 2015-11-16 ENCOUNTER — Other Ambulatory Visit: Payer: Self-pay | Admitting: Physical Medicine & Rehabilitation

## 2015-11-19 ENCOUNTER — Other Ambulatory Visit: Payer: Self-pay | Admitting: Registered Nurse

## 2015-11-20 ENCOUNTER — Other Ambulatory Visit: Payer: Self-pay | Admitting: Internal Medicine

## 2015-11-21 ENCOUNTER — Encounter: Payer: Self-pay | Admitting: Internal Medicine

## 2015-11-26 ENCOUNTER — Encounter: Payer: Worker's Compensation | Attending: Physical Medicine & Rehabilitation | Admitting: Registered Nurse

## 2015-11-26 ENCOUNTER — Encounter: Payer: Self-pay | Admitting: Registered Nurse

## 2015-11-26 VITALS — BP 127/67 | HR 87

## 2015-11-26 DIAGNOSIS — G90512 Complex regional pain syndrome I of left upper limb: Secondary | ICD-10-CM | POA: Diagnosis not present

## 2015-11-26 DIAGNOSIS — F329 Major depressive disorder, single episode, unspecified: Secondary | ICD-10-CM

## 2015-11-26 DIAGNOSIS — G90522 Complex regional pain syndrome I of left lower limb: Secondary | ICD-10-CM | POA: Insufficient documentation

## 2015-11-26 DIAGNOSIS — Z79899 Other long term (current) drug therapy: Secondary | ICD-10-CM

## 2015-11-26 DIAGNOSIS — G90523 Complex regional pain syndrome I of lower limb, bilateral: Secondary | ICD-10-CM

## 2015-11-26 DIAGNOSIS — F32A Depression, unspecified: Secondary | ICD-10-CM

## 2015-11-26 DIAGNOSIS — Z5181 Encounter for therapeutic drug level monitoring: Secondary | ICD-10-CM

## 2015-11-26 DIAGNOSIS — G894 Chronic pain syndrome: Secondary | ICD-10-CM

## 2015-11-26 DIAGNOSIS — G4700 Insomnia, unspecified: Secondary | ICD-10-CM

## 2015-11-26 MED ORDER — HYDROCODONE-ACETAMINOPHEN 7.5-325 MG PO TABS: ORAL_TABLET | ORAL | Status: DC

## 2015-11-26 MED ORDER — TRAMADOL HCL 50 MG PO TABS: 50.0000 mg | ORAL_TABLET | Freq: Two times a day (BID) | ORAL | Status: DC

## 2015-11-26 NOTE — Progress Notes (Signed)
Subjective:    Patient ID: [name], female    DOB: 06-02-1966, 50 y.o.   MRN: [id number]  HPI: [name] is a 50 year old female who returns for follow up for chronic pain and medication refill. She says her pain is located in her neck, bilateral shoulder's,left arm and bilateral lower extremities, bilateral ankles and bilateral feet. Also states she's experiencing increased burning sensation in her lower extremities and having generalized pain all over.She rates her pain 10. Her current exercise regime is walking short distances in her home. Also states her father passed away on November 04, 2015, she had to settle his affairs, very tearful emotional support given. She has started attending Grief Counseling weekly.   Pain Inventory Average Pain 8 Pain Right Now 10 My pain is constant, sharp, burning, stabbing, tingling and aching  In the last 24 hours, has pain interfered with the following? General activity 0 Relation with others 0 Enjoyment of life 0 What TIME of day is your pain at its worst? all Sleep (in general) Poor  Pain is worse with: walking, bending, sitting, standing and some activites Pain improves with: rest, heat/ice, therapy/exercise, pacing activities, medication, TENS and injections Relief from Meds: 6  Mobility ability to climb steps?  yes do you drive?  no  Function disabled: date disabled 12/94 I need assistance with the following:  bathing, meal prep, household duties and shopping  Neuro/Psych bladder control problems tremor tingling trouble walking spasms dizziness depression anxiety  Prior Studies Any changes since last visit?  no  Physicians involved in your care Any changes since last visit?  no   Family History  Problem Relation Age of Onset  . Cancer Mother     lung  . Hypertension Mother   . Cancer Father     colon  . Hypertension Sister   . Cancer Sister   . Birth defects Maternal Grandmother     breast  . Birth  defects Paternal Grandmother     uterine, stomach, lung  . Cancer Brother   . Hyperlipidemia Sister    Social History   Social History  . Marital Status: Married    Spouse Name: N/A  . Number of Children: 2  . Years of Education: N/A   Occupational History  . Disabled due to RSD    Social History Main Topics  . Smoking status: Never Smoker   . Smokeless tobacco: Never Used  . Alcohol Use: 0.0 oz/week    0 Standard drinks or equivalent per week     Comment: 01/24/2013 "drink or 2 once or /twice/year, if that"  . Drug Use: No  . Sexual Activity: Not Currently   Other Topics Concern  . None   Social History Narrative   Past Surgical History  Procedure Laterality Date  . Oophorectomy Right 2009  . Laparoscopic incisional / umbilical / ventral hernia repair  01/24/2013    IHR w/mesh/notes  . Hernia repair    . Tonsillectomy  1990's  . Nasal septum surgery  1980's?  . Appendectomy  ~ 06/2007  . Bladder repair  ~ 06/2007    "same day after bladder lift" (01/24/2013)  . Bladder suspension  ~ 06/2007  . Vaginal hysterectomy  ` 06/2007  . Diagnostic laparoscopy  1990's & ~ 2000    "I've had a couple; for endometrosis" (01/24/2013)  . Incisional hernia repair N/A 01/24/2013    Procedure: LAPAROSCOPIC INCISIONAL HERNIA;  Surgeon: Harl Bowie, MD;  Location: [place];  Service: General;  Laterality: N/A;  . Insertion of mesh N/A 01/24/2013    Procedure: INSERTION OF MESH;  Surgeon: Harl Bowie, MD;  Location: [place];  Service: General;  Laterality: N/A;   Past Medical History  Diagnosis Date  . Anxiety   . Asthma   . History of colonic polyps   . GERD (gastroesophageal reflux disease)   . Hypertension   . Allergy   . Hyperlipidemia   . ASCVD (arteriosclerotic cardiovascular disease)   . Obesity (BMI 30.0-34.9)   . Sleep apnea     NO MACHINE RECOMMENDED  . TIA (transient ischemic attack) ~ 2009  . Migraines   . Daily headache     "depends on the season" (01/24/2013)  .  Arthritis     "just the norm" (01/24/2013)  . Fibromyalgia   . RSD (reflex sympathetic dystrophy)    BP 127/67 mmHg  Pulse 87  SpO2 94%  Opioid Risk Score:   Fall Risk Score:  `1  Depression screen PHQ 2/9  Depression screen [facility] 2/9 11/26/2015 06/25/2015  Decreased Interest 3 3  Down, Depressed, Hopeless 2 2  PHQ - 2 Score 5 5  Altered sleeping - 3  Tired, decreased energy - 3  Change in appetite - 2  Feeling bad or failure about yourself  - 3  Trouble concentrating - 2  Moving slowly or fidgety/restless - 0  Suicidal thoughts - 1  PHQ-9 Score - 19  Difficult doing work/chores - Somewhat difficult    Review of Systems  Constitutional: Positive for diaphoresis.  Respiratory: Positive for cough.   Cardiovascular: Positive for leg swelling.  Gastrointestinal: Positive for nausea, diarrhea and constipation.  Genitourinary: Positive for difficulty urinating.  Skin: Positive for rash.  All other systems reviewed and are negative.      Objective:   Physical Exam  Constitutional: She is oriented to person, place, and time. She appears well-developed and well-nourished.  HENT:  Head: Normocephalic and atraumatic.  Neck: Normal range of motion. Neck supple.  Cardiovascular: Normal rate and regular rhythm.   Pulmonary/Chest: Effort normal and breath sounds normal.  Musculoskeletal:  Normal Muscle Bulk and Muscle Testing Reveals: Upper Extremities: Right: Full ROM and Muscle Strength 5/5 Left: Decreased ROM 45 Degrees and Muscle Strength 3/5 Thoracic and Lumbar Hypersensitivity Lower Extremities: Full ROM and Muscle Strength 5/5 Right Lower Extremity Flexion Produces Pain into Lower Extremity Left Lower Extremity Flexion Produces Pain into Left Hip Arises from chair slowly Antalgic gait    Neurological: She is alert and oriented to person, place, and time.  Skin: Skin is warm and dry.  Psychiatric: She has a normal mood and affect.  Nursing note and vitals  reviewed.         Assessment & Plan:  1. Complex Regional Pain Syndrome Type 1  Refilled: Hydro-codone 7.5/325mg one tablet twice a day one in the morning and one at bedtime #60. Continue:Tramadol 100 mg ER daily #30 and Tramadol 50 mg one tablet by mouth BID. One tablet in the afternoon and one tablet in the evening. 2. Depression: Continue Cymbalta 3. Insomnia: ContinueTrazodone50 mg HS.  20 minutes of face to face patient care time was spent during this visit. All questions were encouraged and answered.   F/U in 1 month

## 2015-12-09 ENCOUNTER — Other Ambulatory Visit: Payer: Self-pay | Admitting: Registered Nurse

## 2015-12-10 NOTE — Telephone Encounter (Signed)
We are prescribing both

## 2015-12-10 NOTE — Telephone Encounter (Signed)
Please advise on refills? I do not any documentation to continue these meds.

## 2015-12-10 NOTE — Telephone Encounter (Signed)
May refill

## 2015-12-22 ENCOUNTER — Other Ambulatory Visit: Payer: Self-pay | Admitting: Registered Nurse

## 2015-12-23 ENCOUNTER — Telehealth: Payer: Self-pay

## 2015-12-23 MED ORDER — HYDROCODONE-ACETAMINOPHEN 7.5-325 MG PO TABS: ORAL_TABLET | ORAL | Status: DC

## 2015-12-23 NOTE — Telephone Encounter (Signed)
I spoke to Mrs. [name], her husband will pick up her prescription. She will keep her appointment on 3/15.

## 2015-12-23 NOTE — Telephone Encounter (Signed)
Error

## 2015-12-23 NOTE — Telephone Encounter (Signed)
Pt had an appt with ET, but rescheduled due to having the flu and the stomach bug. She has rescheduled her appt to 12/31/15 with ET. She is requesting a refill on her Hydrocodone to help her get through until next week. Please advise on refill.

## 2015-12-24 ENCOUNTER — Encounter: Payer: Worker's Compensation | Admitting: Registered Nurse

## 2015-12-31 ENCOUNTER — Encounter: Payer: Self-pay | Admitting: Registered Nurse

## 2015-12-31 ENCOUNTER — Encounter: Payer: Medicare Other | Attending: Physical Medicine & Rehabilitation | Admitting: Registered Nurse

## 2015-12-31 VITALS — BP 119/74 | HR 71 | Resp 14

## 2015-12-31 DIAGNOSIS — Z79899 Other long term (current) drug therapy: Secondary | ICD-10-CM

## 2015-12-31 DIAGNOSIS — G90523 Complex regional pain syndrome I of lower limb, bilateral: Secondary | ICD-10-CM

## 2015-12-31 DIAGNOSIS — F329 Major depressive disorder, single episode, unspecified: Secondary | ICD-10-CM

## 2015-12-31 DIAGNOSIS — Z5181 Encounter for therapeutic drug level monitoring: Secondary | ICD-10-CM

## 2015-12-31 DIAGNOSIS — G4700 Insomnia, unspecified: Secondary | ICD-10-CM

## 2015-12-31 DIAGNOSIS — G90512 Complex regional pain syndrome I of left upper limb: Secondary | ICD-10-CM | POA: Insufficient documentation

## 2015-12-31 DIAGNOSIS — G90522 Complex regional pain syndrome I of left lower limb: Secondary | ICD-10-CM | POA: Insufficient documentation

## 2015-12-31 DIAGNOSIS — G894 Chronic pain syndrome: Secondary | ICD-10-CM

## 2015-12-31 DIAGNOSIS — F32A Depression, unspecified: Secondary | ICD-10-CM

## 2015-12-31 MED ORDER — HYDROCODONE-ACETAMINOPHEN 7.5-325 MG PO TABS: ORAL_TABLET | ORAL | Status: DC

## 2015-12-31 NOTE — Progress Notes (Signed)
Subjective:    Patient ID: [name], female    DOB: 10/20/1965, 50 y.o.   MRN: [id number]  HPI: [name] is a 50 year old female who returns for follow up for chronic pain and medication refill. She states her pain is located in her neck, bilateral shoulder's,left arm,bilateral lower extremities and bilateral feet. Also states she's having generalized pain all over.She rates her pain 9. Her current exercise regime is walking short distances in her home.   Pain Inventory Average Pain 9 Pain Right Now 9 My pain is constant, sharp, burning, stabbing, tingling and aching  In the last 24 hours, has pain interfered with the following? General activity 9 Relation with others 9 Enjoyment of life 9 What TIME of day is your pain at its worst? morning, daytime, evening, night Sleep (in general) Poor  Pain is worse with: walking, bending, sitting and standing Pain improves with: rest, heat/ice, pacing activities, medication and injections Relief from Meds: 6  Mobility ability to climb steps?  yes do you drive?  yes  Function disabled: date disabled 09/1993 I need assistance with the following:  meal prep, household duties and shopping  Neuro/Psych bladder control problems weakness numbness tremor tingling trouble walking spasms dizziness depression anxiety  Prior Studies Any changes since last visit?  no  Physicians involved in your care Any changes since last visit?  no   Family History  Problem Relation Age of Onset  . Cancer Mother     lung  . Hypertension Mother   . Cancer Father     colon  . Hypertension Sister   . Cancer Sister   . Birth defects Maternal Grandmother     breast  . Birth defects Paternal Grandmother     uterine, stomach, lung  . Cancer Brother   . Hyperlipidemia Sister    Social History   Social History  . Marital Status: Married    Spouse Name: N/A  . Number of Children: 2  . Years of Education: N/A   Occupational  History  . Disabled due to RSD    Social History Main Topics  . Smoking status: Never Smoker   . Smokeless tobacco: Never Used  . Alcohol Use: 0.0 oz/week    0 Standard drinks or equivalent per week     Comment: 01/24/2013 "drink or 2 once or /twice/year, if that"  . Drug Use: No  . Sexual Activity: Not Currently   Other Topics Concern  . None   Social History Narrative   Past Surgical History  Procedure Laterality Date  . Oophorectomy Right 2009  . Laparoscopic incisional / umbilical / ventral hernia repair  01/24/2013    IHR w/mesh/notes  . Hernia repair    . Tonsillectomy  1990's  . Nasal septum surgery  1980's?  . Appendectomy  ~ 06/2007  . Bladder repair  ~ 06/2007    "same day after bladder lift" (01/24/2013)  . Bladder suspension  ~ 06/2007  . Vaginal hysterectomy  ` 06/2007  . Diagnostic laparoscopy  1990's & ~ 2000    "I've had a couple; for endometrosis" (01/24/2013)  . Incisional hernia repair N/A 01/24/2013    Procedure: LAPAROSCOPIC INCISIONAL HERNIA;  Surgeon: Shelly Rubensteinouglas A Blackman, MD;  Location: [facility];  Service: General;  Laterality: N/A;  . Insertion of mesh N/A 01/24/2013    Procedure: INSERTION OF MESH;  Surgeon: Shelly Rubensteinouglas A Blackman, MD;  Location: [facility];  Service: General;  Laterality: N/A;   Past Medical  History  Diagnosis Date  . Anxiety   . Asthma   . History of colonic polyps   . GERD (gastroesophageal reflux disease)   . Hypertension   . Allergy   . Hyperlipidemia   . ASCVD (arteriosclerotic cardiovascular disease)   . Obesity (BMI 30.0-34.9)   . Sleep apnea     NO MACHINE RECOMMENDED  . TIA (transient ischemic attack) ~ 2009  . Migraines   . Daily headache     "depends on the season" (01/24/2013)  . Arthritis     "just the norm" (01/24/2013)  . Fibromyalgia   . RSD (reflex sympathetic dystrophy)    BP 119/74 mmHg  Pulse 71  Resp 14  SpO2 94%  Opioid Risk Score:   Fall Risk Score:  `1  Depression screen PHQ 2/9  Depression screen [facility] 2/9 11/26/2015  06/25/2015  Decreased Interest 3 3  Down, Depressed, Hopeless 2 2  PHQ - 2 Score 5 5  Altered sleeping - 3  Tired, decreased energy - 3  Change in appetite - 2  Feeling bad or failure about yourself  - 3  Trouble concentrating - 2  Moving slowly or fidgety/restless - 0  Suicidal thoughts - 1  PHQ-9 Score - 19  Difficult doing work/chores - Somewhat difficult     Review of Systems  Constitutional:       Bladder control problems   Musculoskeletal: Positive for gait problem.  Neurological: Positive for dizziness, tremors, weakness and numbness.       Tingling Spasms   Psychiatric/Behavioral: Positive for dysphoric mood. The patient is nervous/anxious.   All other systems reviewed and are negative.      Objective:   Physical Exam  Constitutional: She is oriented to person, place, and time. She appears well-developed and well-nourished.  HENT:  Head: Normocephalic and atraumatic.  Neck: Normal range of motion. Neck supple.  Cardiovascular: Normal rate and regular rhythm.   Pulmonary/Chest: Effort normal and breath sounds normal.  Musculoskeletal:  Normal Muscle Bulk and Muscle Testing Reveals: Upper Extremities: Right: Full ROM and Muscle Strength 5/5 Left: Decreased ROM 90 Degrees and Muscle Strength 2/5 Lower Extremities: Full ROM and Muscle Strength 5/5 Bilateral Lower Extremities Flexion Produces Pain into Bilateral Feet Arises from chair slowly  Antalgic Gait  Neurological: She is alert and oriented to person, place, and time.  Skin: Skin is warm and dry.  Psychiatric: She has a normal mood and affect.  Nursing note and vitals reviewed.         Assessment & Plan:  1. Complex Regional Pain Syndrome Type 1  Refilled: Hydro-codone 7.5/325mg  one tablet twice a day one in the morning and one at bedtime #60. Continue:Tramadol 100 mg ER daily #30 and Tramadol 50 mg one tablet by mouth BID. One tablet in the afternoon and one tablet in the evening. 2. Depression:  Continue Cymbalta 3. Insomnia: ContinueTrazodone50 mg HS.  20 minutes of face to face patient care time was spent during this visit. All questions were encouraged and answered.   F/U in 1 month

## 2016-01-01 ENCOUNTER — Telehealth: Payer: Self-pay | Admitting: Registered Nurse

## 2016-01-01 DIAGNOSIS — G9050 Complex regional pain syndrome I, unspecified: Secondary | ICD-10-CM

## 2016-01-01 NOTE — Telephone Encounter (Signed)
Placed a call to Mrs. [name] regarding finding another doctor for second opinion in [place]. Spoke with Dr. Letta Pate he recommended Dr. Maryjean Ka. She verbalizes understanding. Referral will be placed.

## 2016-01-05 LAB — TOXASSURE SELECT,+ANTIDEPR,UR: PDF: 0

## 2016-01-06 NOTE — Progress Notes (Signed)
Urine drug screen for this encounter is consistent for prescribed medication 

## 2016-01-07 ENCOUNTER — Other Ambulatory Visit: Payer: Self-pay | Admitting: Registered Nurse

## 2016-01-07 ENCOUNTER — Other Ambulatory Visit: Payer: Self-pay | Admitting: Physical Medicine & Rehabilitation

## 2016-01-07 NOTE — Telephone Encounter (Signed)
Please advise on refills? She has not seen [facility] since July 2016. I do not see documentation on these.Marland Kitchen

## 2016-01-14 ENCOUNTER — Telehealth: Payer: Self-pay | Admitting: Registered Nurse

## 2016-01-14 NOTE — Telephone Encounter (Signed)
LVM To advise referral is denied and to call [place] [facility] to find approved MD for [place] to refer to

## 2016-01-18 ENCOUNTER — Other Ambulatory Visit: Payer: Self-pay | Admitting: Internal Medicine

## 2016-02-09 ENCOUNTER — Encounter: Payer: Worker's Compensation | Attending: Physical Medicine & Rehabilitation | Admitting: Registered Nurse

## 2016-02-09 ENCOUNTER — Encounter: Payer: Self-pay | Admitting: Registered Nurse

## 2016-02-09 VITALS — BP 118/68 | HR 77 | Resp 14

## 2016-02-09 DIAGNOSIS — G894 Chronic pain syndrome: Secondary | ICD-10-CM

## 2016-02-09 DIAGNOSIS — Z79899 Other long term (current) drug therapy: Secondary | ICD-10-CM

## 2016-02-09 DIAGNOSIS — G90512 Complex regional pain syndrome I of left upper limb: Secondary | ICD-10-CM | POA: Diagnosis not present

## 2016-02-09 DIAGNOSIS — G90522 Complex regional pain syndrome I of left lower limb: Secondary | ICD-10-CM | POA: Diagnosis not present

## 2016-02-09 DIAGNOSIS — G90523 Complex regional pain syndrome I of lower limb, bilateral: Secondary | ICD-10-CM

## 2016-02-09 DIAGNOSIS — Z5181 Encounter for therapeutic drug level monitoring: Secondary | ICD-10-CM

## 2016-02-09 MED ORDER — HYDROCODONE-ACETAMINOPHEN 7.5-325 MG PO TABS: ORAL_TABLET | ORAL | Status: DC

## 2016-02-09 MED ORDER — TRAMADOL HCL ER 100 MG PO TB24: 100.0000 mg | ORAL_TABLET | Freq: Every day | ORAL | Status: DC

## 2016-02-09 NOTE — Progress Notes (Signed)
Subjective:    Patient ID: [name], female    DOB: 1966-07-13, 50 y.o.   MRN: [id number]  HPI: [name] is a 50 year old female who returns for follow up for chronic pain and medication refill. She states her pain is located in her neck,left arm,bilateral lower extremities and bilateral feet. Also states she's having generalized pain all over.She rates her pain 10. Her current exercise regime is walking short distances in her home.   Pain Inventory Average Pain 8 Pain Right Now 10 My pain is no selection  In the last 24 hours, has pain interfered with the following? General activity 10 Relation with others 10 Enjoyment of life 10 What TIME of day is your pain at its worst? varies Sleep (in general) Poor  Pain is worse with: walking, bending, sitting and standing Pain improves with: heat/ice, therapy/exercise, medication, TENS and injections Relief from Meds: 6  Mobility walk without assistance ability to climb steps?  yes do you drive?  yes  Function disabled: date disabled . I need assistance with the following:  meal prep, household duties and shopping  Neuro/Psych bladder control problems weakness numbness tremor tingling trouble walking spasms dizziness depression anxiety  Prior Studies Any changes since last visit?  no  Physicians involved in your care Any changes since last visit?  no   Family History  Problem Relation Age of Onset  . Cancer Mother     lung  . Hypertension Mother   . Cancer Father     colon  . Hypertension Sister   . Cancer Sister   . Birth defects Maternal Grandmother     breast  . Birth defects Paternal Grandmother     uterine, stomach, lung  . Cancer Brother   . Hyperlipidemia Sister    Social History   Social History  . Marital Status: Married    Spouse Name: N/A  . Number of Children: 2  . Years of Education: N/A   Occupational History  . Disabled due to RSD    Social History Main Topics    . Smoking status: Never Smoker   . Smokeless tobacco: Never Used  . Alcohol Use: 0.0 oz/week    0 Standard drinks or equivalent per week     Comment: 01/24/2013 "drink or 2 once or /twice/year, if that"  . Drug Use: No  . Sexual Activity: Not Currently   Other Topics Concern  . None   Social History Narrative   Past Surgical History  Procedure Laterality Date  . Oophorectomy Right 2009  . Laparoscopic incisional / umbilical / ventral hernia repair  01/24/2013    IHR w/mesh/notes  . Hernia repair    . Tonsillectomy  1990's  . Nasal septum surgery  1980's?  . Appendectomy  ~ 06/2007  . Bladder repair  ~ 06/2007    "same day after bladder lift" (01/24/2013)  . Bladder suspension  ~ 06/2007  . Vaginal hysterectomy  ` 06/2007  . Diagnostic laparoscopy  1990's & ~ 2000    "I've had a couple; for endometrosis" (01/24/2013)  . Incisional hernia repair N/A 01/24/2013    Procedure: LAPAROSCOPIC INCISIONAL HERNIA;  Surgeon: Harl Bowie, MD;  Location: [place];  Service: General;  Laterality: N/A;  . Insertion of mesh N/A 01/24/2013    Procedure: INSERTION OF MESH;  Surgeon: Harl Bowie, MD;  Location: [place];  Service: General;  Laterality: N/A;   Past Medical History  Diagnosis Date  . Anxiety   .  Asthma   . History of colonic polyps   . GERD (gastroesophageal reflux disease)   . Hypertension   . Allergy   . Hyperlipidemia   . ASCVD (arteriosclerotic cardiovascular disease)   . Obesity (BMI 30.0-34.9)   . Sleep apnea     NO MACHINE RECOMMENDED  . TIA (transient ischemic attack) ~ 2009  . Migraines   . Daily headache     "depends on the season" (01/24/2013)  . Arthritis     "just the norm" (01/24/2013)  . Fibromyalgia   . RSD (reflex sympathetic dystrophy)    BP 118/68 mmHg  Pulse 77  Resp 14  SpO2 96%  Opioid Risk Score:   Fall Risk Score:  `1  Depression screen PHQ 2/9  Depression screen [facility] 2/9 11/26/2015 06/25/2015  Decreased Interest 3 3  Down, Depressed, Hopeless 2  2  PHQ - 2 Score 5 5  Altered sleeping - 3  Tired, decreased energy - 3  Change in appetite - 2  Feeling bad or failure about yourself  - 3  Trouble concentrating - 2  Moving slowly or fidgety/restless - 0  Suicidal thoughts - 1  PHQ-9 Score - 19  Difficult doing work/chores - Somewhat difficult     Review of Systems  Constitutional: Positive for diaphoresis and unexpected weight change.  Respiratory: Positive for apnea and cough.   Cardiovascular: Positive for leg swelling.  Gastrointestinal: Positive for nausea, vomiting, diarrhea and constipation.  Skin: Positive for rash.  All other systems reviewed and are negative.      Objective:   Physical Exam  Constitutional: She is oriented to person, place, and time. She appears well-developed and well-nourished.  HENT:  Head: Normocephalic and atraumatic.  Neck: Normal range of motion. Neck supple.  Cardiovascular: Normal rate and regular rhythm.   Pulmonary/Chest: Effort normal and breath sounds normal.  Musculoskeletal:  Normal Muscle Bulk and Muscle Testing Reveals: Upper Extremities: Right: Full ROM and Muscle Strength 5/5 Left: Decreased ROM 45 Degrees and Muscle Strength 3/5 Thoracic Paraspinal Tenderness: T-7- T-9 Lumbar Paraspinal Tenderness: L-3- L-5 Lower Extremities: Full ROM an Muscle Strength 5/5 Bilateral Lower Extremities Flexion Produces Pain into Extremities Arises from chair slowly Antalgic Gait  Neurological: She is alert and oriented to person, place, and time.  Skin: Skin is warm and dry.  Psychiatric: She has a normal mood and affect.  Nursing note and vitals reviewed.         Assessment & Plan:  1. Complex Regional Pain Syndrome Type 1  Refilled: Hydro-codone 7.5/325mg one tablet twice a day one in the morning and one at bedtime #60. Continue:Tramadol 100 mg ER daily #30 and Tramadol 50 mg one tablet by mouth BID. One tablet in the afternoon and one tablet in the evening. We will continue the  opioid monitoring program, this consists of regular clinic visits, examinations, urine drug screen, pill counts as well as use of [place] Controlled Substance Reporting System. 2. Depression: Continue Cymbalta 3. Insomnia: ContinueTrazodone50 mg HS.  20 minutes of face to face patient care time was spent during this visit. All questions were encouraged and answered.   F/U in 1 month

## 2016-02-14 ENCOUNTER — Other Ambulatory Visit: Payer: Self-pay | Admitting: Internal Medicine

## 2016-03-09 ENCOUNTER — Encounter: Payer: Self-pay | Admitting: Physical Medicine & Rehabilitation

## 2016-03-09 ENCOUNTER — Encounter: Payer: Worker's Compensation | Attending: Physical Medicine & Rehabilitation

## 2016-03-09 ENCOUNTER — Ambulatory Visit (HOSPITAL_BASED_OUTPATIENT_CLINIC_OR_DEPARTMENT_OTHER): Payer: Worker's Compensation | Admitting: Physical Medicine & Rehabilitation

## 2016-03-09 VITALS — BP 106/64 | HR 92 | Resp 14

## 2016-03-09 DIAGNOSIS — G90512 Complex regional pain syndrome I of left upper limb: Secondary | ICD-10-CM | POA: Diagnosis not present

## 2016-03-09 DIAGNOSIS — G90523 Complex regional pain syndrome I of lower limb, bilateral: Secondary | ICD-10-CM

## 2016-03-09 DIAGNOSIS — G90522 Complex regional pain syndrome I of left lower limb: Secondary | ICD-10-CM | POA: Diagnosis present

## 2016-03-09 MED ORDER — TRAZODONE HCL 50 MG PO TABS: ORAL_TABLET | ORAL | Status: DC

## 2016-03-09 MED ORDER — LIDOCAINE 5 % EX PTCH: MEDICATED_PATCH | CUTANEOUS | Status: DC

## 2016-03-09 MED ORDER — CYMBALTA 30 MG PO CPEP: ORAL_CAPSULE | ORAL | Status: DC

## 2016-03-09 MED ORDER — HYDROCODONE-ACETAMINOPHEN 7.5-325 MG PO TABS: ORAL_TABLET | ORAL | Status: DC

## 2016-03-09 NOTE — Progress Notes (Signed)
Subjective:    Patient ID: [name], female    DOB: 1966-09-03, 50 y.o.   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 The RST affected her left upper extremity for many years then had a lower extremity soft tissue injury and developed some symptoms consistent with reflex sympathetic dystrophy starting with the left lower extremity and then starting to involve the right lower extremity. She has undergone tibial nerve blocks with minimal relief. She has not had lumbar sympathetic injections. She complains of swelling from time to time usually at then the day. She does have hypersensitivity of her feet and is wearing Lidoderm patch on each foot as well as wearing sandals. She's had no adverse side effects from her medications. She remains independent with all her self-care and mobility however she states her ambulating distance has diminished over time.  Pain Inventory Average Pain 8 Pain Right Now 10 My pain is constant, sharp, burning, stabbing, tingling and aching  In the last 24 hours, has pain interfered with the following? General activity 10 Relation with others 10 Enjoyment of life 10 What TIME of day is your pain at its worst? varies Sleep (in general) Poor  Pain is worse with: walking, bending, sitting and standing Pain improves with: rest, heat/ice, therapy/exercise, medication, TENS and injections Relief from Meds: 6  Mobility walk without assistance ability to climb steps?  yes do you drive?  yes  Function disabled: date disabled .  Neuro/Psych bladder control problems numbness tremor tingling trouble walking spasms dizziness depression anxiety  Prior Studies Any changes since last visit?  no  Physicians involved in your care Any changes since last visit?  no   Family History  Problem Relation Age of Onset  . Cancer Mother    lung  . Hypertension Mother   . Cancer Father     colon  . Hypertension Sister   . Cancer Sister   . Birth defects Maternal Grandmother     breast  . Birth defects Paternal Grandmother     uterine, stomach, lung  . Cancer Brother   . Hyperlipidemia Sister    Social History   Social History  . Marital Status: Married    Spouse Name: N/A  . Number of Children: 2  . Years of Education: N/A   Occupational History  . Disabled due to RSD    Social History Main Topics  . Smoking status: Never Smoker   . Smokeless tobacco: Never Used  . Alcohol Use: 0.0 oz/week    0 Standard drinks or equivalent per week     Comment: 01/24/2013 "drink or 2 once or /twice/year, if that"  . Drug Use: No  . Sexual Activity: Not Currently   Other Topics Concern  . None   Social History Narrative   Past Surgical History  Procedure Laterality Date  . Oophorectomy Right 2009  . Laparoscopic incisional / umbilical / ventral hernia repair  01/24/2013    IHR w/mesh/notes  . Hernia repair    . Tonsillectomy  1990's  . Nasal septum surgery  1980's?  . Appendectomy  ~ 06/2007  . Bladder repair  ~ 06/2007    "same day after bladder lift" (01/24/2013)  . Bladder suspension  ~ 06/2007  . Vaginal hysterectomy  ` 06/2007  . Diagnostic laparoscopy  1990's & ~ 2000    "I've had a couple; for endometrosis" (01/24/2013)  .  Incisional hernia repair N/A 01/24/2013    Procedure: LAPAROSCOPIC INCISIONAL HERNIA;  Surgeon: Harl Bowie, MD;  Location: [facility];  Service: General;  Laterality: N/A;  . Insertion of mesh N/A 01/24/2013    Procedure: INSERTION OF MESH;  Surgeon: Harl Bowie, MD;  Location: [facility];  Service: General;  Laterality: N/A;   Past Medical History  Diagnosis Date  . Anxiety   . Asthma   . History of colonic polyps   . GERD (gastroesophageal reflux disease)   . Hypertension   . Allergy   . Hyperlipidemia   . ASCVD (arteriosclerotic cardiovascular disease)   . Obesity (BMI 30.0-34.9)   .  Sleep apnea     NO MACHINE RECOMMENDED  . TIA (transient ischemic attack) ~ 2009  . Migraines   . Daily headache     "depends on the season" (01/24/2013)  . Arthritis     "just the norm" (01/24/2013)  . Fibromyalgia   . RSD (reflex sympathetic dystrophy)    BP 106/64 mmHg  Pulse 92  Resp 14  SpO2 96%  Opioid Risk Score:   Fall Risk Score:  `1  Depression screen PHQ 2/9  Depression screen [facility] 2/9 11/26/2015 06/25/2015  Decreased Interest 3 3  Down, Depressed, Hopeless 2 2  PHQ - 2 Score 5 5  Altered sleeping - 3  Tired, decreased energy - 3  Change in appetite - 2  Feeling bad or failure about yourself  - 3  Trouble concentrating - 2  Moving slowly or fidgety/restless - 0  Suicidal thoughts - 1  PHQ-9 Score - 19  Difficult doing work/chores - Somewhat difficult     Review of Systems  Constitutional: Positive for diaphoresis.  Respiratory: Positive for apnea.   Gastrointestinal: Positive for constipation.  Skin: Positive for rash.  All other systems reviewed and are negative.      Objective:   Physical Exam  Constitutional: She is oriented to person, place, and time. She appears well-developed and well-nourished.  HENT:  Head: Normocephalic and atraumatic.  Eyes: Conjunctivae and EOM are normal. Pupils are equal, round, and reactive to light.  Neck: Normal range of motion.  Neurological: She is alert and oriented to person, place, and time. No sensory deficit. Coordination abnormal.  Hypersensitivity touch left upper extremity and bilateral feet Decreased fine motor in left upper extremity as well as bilateral lower extremity  Skin:  Skin temperature is warm bilateral feet as well as left hand  Psychiatric: She has a normal mood and affect.  Nursing note and vitals reviewed.  Left wrist without evidence of swelling no erythema good radial pulse. She has 4 minus movement pain related in the left shoulder elbow and wrist. No hyperhidrosis. No skin or nail  changes Lower extremities have no evidence of swelling positive hypersensitivity to touch on the left and right dorsum of the foot. Mild pain with ankle and foot range of motion. No evidence of ankle joint or toe swelling. No evidence of erythema. She is able to ambulate without an assisted device without evidence of toe drag or knee instability Her lower extremity strength 4 minus in the hip flexor and extensor ankle dorsiflexors and plantar flexors bilaterally       Assessment & Plan:  1. History of reflex intact this date just the left upper extremity with some generalization into the lower extremities. She has been doing relatively well on the current medication regimen which consists of Hydrocodone 7.5 mg twice a day Topamax  100 mg in the morning and 200 mg at night Zanaflex 4 mg 4 times a day Lidoderm patch 3 patches on 7 AM off 7 PM Tramadol ER 100 mg per day Tramadol IR 50 mg twice a day Trazodone 50 mA daily at bedtime  We discussed treatment options, I think it's reasonable for her to try lumbar sympathetic Injections and we'll make a referral  Also I have reviewed the patient's medication regimen and there may be opportunity to simplify it. Consideration for Nucynta ER 50 mg twice a day this would take the place of hydrocodone as well as tramadol and Topamax

## 2016-03-09 NOTE — Patient Instructions (Signed)
Have made referral for lumbar sympathetic injections, Dr. Isaiah Blakes

## 2016-03-15 ENCOUNTER — Other Ambulatory Visit: Payer: Self-pay | Admitting: Internal Medicine

## 2016-03-18 ENCOUNTER — Telehealth: Payer: Self-pay | Admitting: Internal Medicine

## 2016-03-18 NOTE — Telephone Encounter (Signed)
Form placed in Dr Letvak's InBox on his desk 

## 2016-03-18 NOTE — Telephone Encounter (Signed)
Form done No charge 

## 2016-03-18 NOTE — Telephone Encounter (Signed)
I notified patient form is ready for pick up.

## 2016-03-18 NOTE — Telephone Encounter (Signed)
Pt dropped off DM[employer] handicap placard form for renewal. I put form in the Rx tower. Please call [phone] when complete.

## 2016-03-25 ENCOUNTER — Telehealth: Payer: Self-pay | Admitting: Physical Medicine & Rehabilitation

## 2016-03-25 NOTE — Telephone Encounter (Signed)
Patient is needing a letter to send to her attorney that states that she is benefiting from the use of her medications.  Patient brought by form that was filled out by Dr. Letta Pate and I will give this to Zella Ball to look at and type up a letter for patient.  She does need the form and letter faxed over to her attorney at [phone].

## 2016-03-28 ENCOUNTER — Other Ambulatory Visit: Payer: Self-pay | Admitting: Internal Medicine

## 2016-04-02 ENCOUNTER — Telehealth: Payer: Self-pay | Admitting: Registered Nurse

## 2016-04-02 NOTE — Telephone Encounter (Signed)
Letter has been written and Faxed to [employer].

## 2016-04-06 ENCOUNTER — Telehealth: Payer: Self-pay

## 2016-04-06 ENCOUNTER — Encounter: Payer: Self-pay | Admitting: Registered Nurse

## 2016-04-06 ENCOUNTER — Encounter: Payer: Medicare Other | Attending: Physical Medicine & Rehabilitation | Admitting: Registered Nurse

## 2016-04-06 VITALS — BP 118/82 | HR 74

## 2016-04-06 DIAGNOSIS — Z79899 Other long term (current) drug therapy: Secondary | ICD-10-CM

## 2016-04-06 DIAGNOSIS — G90512 Complex regional pain syndrome I of left upper limb: Secondary | ICD-10-CM | POA: Insufficient documentation

## 2016-04-06 DIAGNOSIS — G90523 Complex regional pain syndrome I of lower limb, bilateral: Secondary | ICD-10-CM

## 2016-04-06 DIAGNOSIS — G90522 Complex regional pain syndrome I of left lower limb: Secondary | ICD-10-CM | POA: Insufficient documentation

## 2016-04-06 DIAGNOSIS — G894 Chronic pain syndrome: Secondary | ICD-10-CM

## 2016-04-06 MED ORDER — HYDROCODONE-ACETAMINOPHEN 7.5-325 MG PO TABS: ORAL_TABLET | ORAL | Status: DC

## 2016-04-06 NOTE — Telephone Encounter (Signed)
Entered in error

## 2016-04-06 NOTE — Patient Instructions (Signed)
Here's Your appointment for Dr. Yolanda BonineBertrand  Dr. Yolanda BonineBertrand 04/21/16 at 2:30 location is [address] [place]

## 2016-04-08 ENCOUNTER — Encounter: Payer: Self-pay | Admitting: Registered Nurse

## 2016-04-08 NOTE — Progress Notes (Signed)
Subjective:    Patient ID: [name], female    DOB: 02/16/1966, 50 y.o.   MRN: [id number]  HPI: [name] is a 50 year old female who returns for follow up for chronic pain and medication refill. She states her pain is located in her left arm,bilateral shoulders, bilateral lower extremities and bilateral feet..She rates her pain 8. Her current exercise regime is walking short distances in her home.   Pain Inventory Average Pain 8 Pain Right Now 8 My pain is constant, sharp, burning, stabbing, tingling and aching  In the last 24 hours, has pain interfered with the following? General activity no selection Relation with others no selection Enjoyment of life no selection What TIME of day is your pain at its worst? no selection Sleep (in general) Poor  Pain is worse with: walking, bending, sitting and standing Pain improves with: rest, therapy/exercise, pacing activities, medication, TENS and injections Relief from Meds: 6  Mobility walk without assistance do you drive?  yes  Function disabled: date disabled . I need assistance with the following:  meal prep, household duties and shopping  Neuro/Psych bladder control problems numbness tremor tingling spasms dizziness depression anxiety  Prior Studies Any changes since last visit?  no  Physicians involved in your care Any changes since last visit?  no   Family History  Problem Relation Age of Onset  . Cancer Mother     lung  . Hypertension Mother   . Cancer Father     colon  . Hypertension Sister   . Cancer Sister   . Birth defects Maternal Grandmother     breast  . Birth defects Paternal Grandmother     uterine, stomach, lung  . Cancer Brother   . Hyperlipidemia Sister    Social History   Social History  . Marital Status: Married    Spouse Name: N/A  . Number of Children: 2  . Years of Education: N/A   Occupational History  . Disabled due to RSD    Social History Main Topics    . Smoking status: Never Smoker   . Smokeless tobacco: Never Used  . Alcohol Use: 0.0 oz/week    0 Standard drinks or equivalent per week     Comment: 01/24/2013 "drink or 2 once or /twice/year, if that"  . Drug Use: No  . Sexual Activity: Not Currently   Other Topics Concern  . None   Social History Narrative   Past Surgical History  Procedure Laterality Date  . Oophorectomy Right 2009  . Laparoscopic incisional / umbilical / ventral hernia repair  01/24/2013    IHR w/mesh/notes  . Hernia repair    . Tonsillectomy  1990's  . Nasal septum surgery  1980's?  . Appendectomy  ~ 06/2007  . Bladder repair  ~ 06/2007    "same day after bladder lift" (01/24/2013)  . Bladder suspension  ~ 06/2007  . Vaginal hysterectomy  ` 06/2007  . Diagnostic laparoscopy  1990's & ~ 2000    "I've had a couple; for endometrosis" (01/24/2013)  . Incisional hernia repair N/A 01/24/2013    Procedure: LAPAROSCOPIC INCISIONAL HERNIA;  Surgeon: Shelly Rubensteinouglas A Blackman, MD;  Location: [facility];  Service: General;  Laterality: N/A;  . Insertion of mesh N/A 01/24/2013    Procedure: INSERTION OF MESH;  Surgeon: Shelly Rubensteinouglas A Blackman, MD;  Location: [facility];  Service: General;  Laterality: N/A;   Past Medical History  Diagnosis Date  . Anxiety   . Asthma   .  History of colonic polyps   . GERD (gastroesophageal reflux disease)   . Hypertension   . Allergy   . Hyperlipidemia   . ASCVD (arteriosclerotic cardiovascular disease)   . Obesity (BMI 30.0-34.9)   . Sleep apnea     NO MACHINE RECOMMENDED  . TIA (transient ischemic attack) ~ 2009  . Migraines   . Daily headache     "depends on the season" (01/24/2013)  . Arthritis     "just the norm" (01/24/2013)  . Fibromyalgia   . RSD (reflex sympathetic dystrophy)    BP 118/82 mmHg  Pulse 74  SpO2 95%  Opioid Risk Score:   Fall Risk Score:  `1  Depression screen PHQ 2/9  [facility] 2/[facility] 11/26/2015 06/25/2015  Decreased Interest 3 3  Down, Depressed, Hopeless 2 2  PHQ -  2 Score 5 5  Altered sleeping - 3  Tired, decreased energy - 3  Change in appetite - 2  Feeling bad or failure about yourself  - 3  Trouble concentrating - 2  Moving slowly or fidgety/restless - 0  Suicidal thoughts - 1  PHQ-9 Score - 19  Difficult doing work/chores - Somewhat difficult     Review of Systems  Constitutional: Negative.   HENT: Negative.   Eyes: Negative.   Respiratory: Negative.  Negative for shortness of breath.   Cardiovascular: Negative.   Gastrointestinal: Negative.  Negative for constipation.  Endocrine: Negative.   Genitourinary: Positive for difficulty urinating.  Musculoskeletal: Positive for myalgias, back pain and neck pain.  Skin: Negative.   Neurological: Positive for tremors and numbness.       Tingling   Psychiatric/Behavioral: Positive for dysphoric mood. The patient is nervous/anxious.   All other systems reviewed and are negative.      Objective:   Physical Exam  Constitutional: She is oriented to person, place, and time. She appears well-developed and well-nourished.  HENT:  Head: Normocephalic and atraumatic.  Neck: Normal range of motion. Neck supple.  Cardiovascular: Normal rate and regular rhythm.   Pulmonary/Chest: Effort normal and breath sounds normal.  Musculoskeletal:  N ormal Muscle Bulk and Muscle Testing Reveals: Upper Extremities: Right: Full ROM and Muscle Strength 5/5 Left: Decreased ROM 45 Degrees and Muscle Strength 2/5 Thoracic Paraspinal Tenderness: T-1- T-3  T-7- T-9 Lower Extremities: Decreased ROM and Muscle Strength 4/5 Bilateral Lower Extremities Flexion Produces Pain into Bilateral lower extremities and bilateral feet Arises from chair slowly Antalgic gait  Neurological: She is alert and oriented to person, place, and time.  Skin: Skin is warm and dry.  Psychiatric: She has a normal mood and affect.  Nursing note and vitals reviewed.         Assessment & Plan:  1. Complex Regional Pain Syndrome Type  1  Refilled: Hydro-codone 7.5/325mg  one tablet twice a day one in the morning and one at bedtime #60. Continue:Tramadol 100 mg ER daily #30 and Tramadol 50 mg one tablet by mouth BID. One tablet in the afternoon and one tablet in the evening. We will continue the opioid monitoring program, this consists of regular clinic visits, examinations, urine drug screen, pill counts as well as use of [place] Controlled Substance [place]. 2. Depression: Continue Cymbalta 3. Insomnia: ContinueTrazodone50 mg HS.  20 minutes of face to face patient care time was spent during this visit. All questions were encouraged and answered.   F/U in 1 month

## 2016-04-25 ENCOUNTER — Other Ambulatory Visit: Payer: Self-pay | Admitting: Internal Medicine

## 2016-04-26 ENCOUNTER — Encounter: Payer: Self-pay | Admitting: Internal Medicine

## 2016-04-26 ENCOUNTER — Ambulatory Visit (INDEPENDENT_AMBULATORY_CARE_PROVIDER_SITE_OTHER): Payer: Self-pay | Admitting: Internal Medicine

## 2016-04-26 VITALS — BP 118/82 | HR 66 | Temp 98.0°F | Ht 67.5 in | Wt 183.0 lb

## 2016-04-26 DIAGNOSIS — K2100 Gastro-esophageal reflux disease with esophagitis, without bleeding: Secondary | ICD-10-CM

## 2016-04-26 DIAGNOSIS — I10 Essential (primary) hypertension: Secondary | ICD-10-CM

## 2016-04-26 DIAGNOSIS — G90529 Complex regional pain syndrome I of unspecified lower limb: Secondary | ICD-10-CM

## 2016-04-26 DIAGNOSIS — J4520 Mild intermittent asthma, uncomplicated: Secondary | ICD-10-CM

## 2016-04-26 MED ORDER — ALBUTEROL SULFATE HFA 108 (90 BASE) MCG/ACT IN AERS
2.0000 | INHALATION_SPRAY | RESPIRATORY_TRACT | Status: DC | PRN
Start: 2016-04-26 — End: 2017-05-02

## 2016-04-26 NOTE — Patient Instructions (Addendum)
You are due for your mammogram in September.

## 2016-04-26 NOTE — Progress Notes (Signed)
Subjective:    Patient ID: [name], female    DOB: Oct 03, 1966, 50 y.o.   MRN: [id number]  HPI Here for physical but no insurance  Ongoing chronic pain issues--keeps up with physiatry office "100 times worse" Handicapped permit signed again today Considering lumbar blocks--but having trouble with getting approval through insurance  Having some lower abdominal pain at times Ever since the hernia operation More constant now Occasionally gets catch in upper stomach Worse after lifting laundry basket 1 week ago Pain used to be brief--but now more long lasting Bowels are still slow-- doesn't take anything for this though  Hasn't seen gyn in some time Due for mammogram in the fall--but not sure she can afford  Asthma has been mostly quiet Hasn't had an inhaler--will try to get refilled  Has to continue the PPI No heartburn or swallowing problems on the medication  Current Outpatient Prescriptions on File Prior to Visit  Medication Sig Dispense Refill  . albuterol (PROAIR HFA) 108 (90 BASE) MCG/ACT inhaler Inhale 2 puffs into the lungs every 4 (four) hours as needed. 1 Inhaler 4  . calcium carbonate (OS-CAL) 600 MG TABS tablet Take 1 tablet (600 mg total) by mouth 2 (two) times daily with a meal. 60 tablet 12  . clobetasol cream (TEMOVATE) 4.68 % Apply 1 application topically 2 (two) times daily. 30 g 0  . CYMBALTA 30 MG capsule TAKE 3 CAPSULES BY MOUTH DAILY. 270 capsule 0  . esomeprazole (NEXIUM) 40 MG capsule TAKE 1 CAPSULE (40 MG TOTAL) BY MOUTH 2 (TWO) TIMES DAILY. 60 capsule 11  . fluticasone (FLONASE) 50 MCG/ACT nasal spray Place 2 sprays into both nostrils daily. 16 g 6  . HYDROcodone-acetaminophen (NORCO) 7.5-325 MG tablet TAKE 1 TABLET Twice a Day. One in the Morning and one at Bedtime 60 tablet 0  . ibuprofen (ADVIL,MOTRIN) 400 MG tablet Take 1 tablet (400 mg total) by mouth 3 (three) times daily. 90 tablet 1  . lidocaine (LIDODERM) 5 % MAY USE UP TO 3 PATCHES  DAILY. REMOVE AND DISCARED PATCHES WITHIN 12 HOURS OR AS DIRECTED 90 patch 2  . loratadine (CLARITIN) 10 MG tablet Take 10 mg by mouth daily.      Marland Kitchen losartan-hydrochlorothiazide (HYZAAR) 100-25 MG tablet TAKE 1 TABLET BY MOUTH DAILY. 30 tablet 1  . PEG 3350-KCl-NaBcb-NaCl-NaSulf (PEG 3350/ELECTROLYTES) 240 G SOLR Take 1 Container by mouth once. Colonoscopy prep    . tiZANidine (ZANAFLEX) 4 MG tablet TAKE 1 TABLET (4 MG TOTAL) BY MOUTH 4 (FOUR) TIMES DAILY. 120 tablet 4  . topiramate (TOPAMAX) 100 MG tablet TAKE 1 TABLET BY MOUTH IN THE MORNING AND 2 TABLETS BY MOUTH IN THE AFTERNOON 90 tablet 4  . traMADol (ULTRAM) 50 MG tablet Take 1 tablet (50 mg total) by mouth 2 (two) times daily. 60 tablet 4  . traMADol (ULTRAM-ER) 100 MG 24 hr tablet Take 1 tablet (100 mg total) by mouth daily. 30 tablet 3  . traZODone (DESYREL) 50 MG tablet TAKE 1 TABLET (50 MG TOTAL) BY MOUTH AT BEDTIME. 30 tablet 2  . vitamin E (VITAMIN E) 400 UNIT capsule Take 1 capsule (400 Units total) by mouth daily. 60 capsule 11   No current facility-administered medications on file prior to visit.    Allergies  Allergen Reactions  . Demerol [Meperidine] Anaphylaxis, Itching and Swelling  . Cymbalta [Duloxetine Hcl] Hives    Generic makes her feel like she is crawling out of her skin  . Diazepam  REACTION: swelling  . Meperidine Hcl     REACTION: swelling  . Other     Axe Cologne    Past Medical History  Diagnosis Date  . Anxiety   . Asthma   . History of colonic polyps   . GERD (gastroesophageal reflux disease)   . Hypertension   . Allergy   . Hyperlipidemia   . ASCVD (arteriosclerotic cardiovascular disease)   . Obesity (BMI 30.0-34.9)   . Sleep apnea     NO MACHINE RECOMMENDED  . TIA (transient ischemic attack) ~ 2009  . Migraines   . Daily headache     "depends on the season" (01/24/2013)  . Arthritis     "just the norm" (01/24/2013)  . Fibromyalgia   . RSD (reflex sympathetic dystrophy)     Past  Surgical History  Procedure Laterality Date  . Oophorectomy Right 2009  . Laparoscopic incisional / umbilical / ventral hernia repair  01/24/2013    IHR w/mesh/notes  . Hernia repair    . Tonsillectomy  1990's  . Nasal septum surgery  1980's?  . Appendectomy  ~ 06/2007  . Bladder repair  ~ 06/2007    "same day after bladder lift" (01/24/2013)  . Bladder suspension  ~ 06/2007  . Vaginal hysterectomy  ` 06/2007  . Diagnostic laparoscopy  1990's & ~ 2000    "I've had a couple; for endometrosis" (01/24/2013)  . Incisional hernia repair N/A 01/24/2013    Procedure: LAPAROSCOPIC INCISIONAL HERNIA;  Surgeon: Harl Bowie, MD;  Location: [facility];  Service: General;  Laterality: N/A;  . Insertion of mesh N/A 01/24/2013    Procedure: INSERTION OF MESH;  Surgeon: Harl Bowie, MD;  Location: [facility];  Service: General;  Laterality: N/A;    Family History  Problem Relation Age of Onset  . Cancer Mother     lung  . Hypertension Mother   . Cancer Father     colon  . Hypertension Sister   . Cancer Sister   . Birth defects Maternal Grandmother     breast  . Birth defects Paternal Grandmother     uterine, stomach, lung  . Cancer Brother   . Hyperlipidemia Sister     Social History   Social History  . Marital Status: Married    Spouse Name: N/A  . Number of Children: 2  . Years of Education: N/A   Occupational History  . Disabled due to RSD    Social History Main Topics  . Smoking status: Never Smoker   . Smokeless tobacco: Never Used  . Alcohol Use: 0.0 oz/week    0 Standard drinks or equivalent per week     Comment: 01/24/2013 "drink or 2 once or /twice/year, if that"  . Drug Use: No  . Sexual Activity: Not Currently   Other Topics Concern  . Not on file   Social History Narrative   Review of Systems Appetite not great Weight is down 9# since my last visit Doesn't sleep well due to pain issues Vision is not great--thinks the RSD is affecting it---blurred and distorted  vision at times Hearing is okay Teeth are okay--- regular with dentist. Has dry mouth No chest pain--but gets some sharp pain under the left breast at times Hard to exercise-- has stationary bike that she tries to get on  Gets easy SOB and wheezing--mostly with anxiety Swelling in calves--stable Some pain in ankles, knees--- probably from RSD though No skin problems other than from RSD Ongoing  depression. Grieving dad who died of cancer recently.    Objective:   Physical Exam  Constitutional: She appears well-developed and well-nourished. No distress.  HENT:  Mouth/Throat: Oropharynx is clear and moist. No oropharyngeal exudate.  Neck: Normal range of motion. Neck supple. No thyromegaly present.  Cardiovascular: Normal rate, regular rhythm, normal heart sounds and intact distal pulses.  Exam reveals no gallop.   No murmur heard. Pulmonary/Chest: Effort normal and breath sounds normal. No respiratory distress. She has no wheezes. She has no rales.  Abdominal: Soft. She exhibits no distension. There is no tenderness. There is no rebound and no guarding.  No recurrent hernia in left inguinal area  Musculoskeletal: She exhibits no edema or tenderness.  Lymphadenopathy:    She has no cervical adenopathy.  Psychiatric:  Frustrated and chronically depressed          Assessment & Plan:

## 2016-04-26 NOTE — Progress Notes (Signed)
Pre visit review using our clinic review tool, if applicable. No additional management support is needed unless otherwise documented below in the visit note. 

## 2016-04-26 NOTE — Assessment & Plan Note (Signed)
Doesn't seem to have persistent problems--just episodic Will try to get her a refill on inhlaer--via assistance program

## 2016-04-26 NOTE — Assessment & Plan Note (Signed)
BP Readings from Last 3 Encounters:  04/26/16 118/82  04/06/16 118/82  03/09/16 106/64   BP fine without meds

## 2016-04-26 NOTE — Assessment & Plan Note (Signed)
Ongoing severe pain Continues with Dr Wynn BankerKirsteins

## 2016-04-26 NOTE — Assessment & Plan Note (Signed)
Does okay with the med

## 2016-04-30 ENCOUNTER — Encounter: Payer: Self-pay | Admitting: Genetic Counselor

## 2016-05-03 ENCOUNTER — Telehealth: Payer: Self-pay

## 2016-05-03 NOTE — Telephone Encounter (Signed)
New forms placed in Dr Karle StarchLetvak's InBox on his desk

## 2016-05-03 NOTE — Telephone Encounter (Addendum)
Audrie LiaLaura Giarraputo [phone]  Vernona RiegerLaura dropped off application for Handicapped Driver's Registration Plate, when she took the one with her name on it, they told her it had to be in her husband's name because his name was on the registration not hers. So she would like for you to fill them out with his name if that is possible. I let her know we might have to do the Disability Parking Placards. Please call her with any questions. Placed in RX box up front.

## 2016-05-03 NOTE — Telephone Encounter (Signed)
I signed the forms No charge [facility]

## 2016-05-04 NOTE — Telephone Encounter (Signed)
Left message on pt voice mail Letting her know forms are ready for pick up

## 2016-05-05 ENCOUNTER — Other Ambulatory Visit: Payer: Self-pay | Admitting: Registered Nurse

## 2016-05-06 ENCOUNTER — Encounter: Payer: Self-pay | Admitting: Registered Nurse

## 2016-05-06 ENCOUNTER — Encounter: Payer: Worker's Compensation | Attending: Physical Medicine & Rehabilitation | Admitting: Registered Nurse

## 2016-05-06 VITALS — BP 115/79 | HR 68 | Resp 14

## 2016-05-06 DIAGNOSIS — G894 Chronic pain syndrome: Secondary | ICD-10-CM

## 2016-05-06 DIAGNOSIS — G90512 Complex regional pain syndrome I of left upper limb: Secondary | ICD-10-CM | POA: Diagnosis not present

## 2016-05-06 DIAGNOSIS — Z5181 Encounter for therapeutic drug level monitoring: Secondary | ICD-10-CM | POA: Diagnosis not present

## 2016-05-06 DIAGNOSIS — Z79899 Other long term (current) drug therapy: Secondary | ICD-10-CM | POA: Diagnosis not present

## 2016-05-06 DIAGNOSIS — G90522 Complex regional pain syndrome I of left lower limb: Secondary | ICD-10-CM | POA: Insufficient documentation

## 2016-05-06 MED ORDER — TRAMADOL HCL ER 100 MG PO TB24: 100.0000 mg | ORAL_TABLET | Freq: Every day | ORAL | Status: DC

## 2016-05-06 MED ORDER — HYDROCODONE-ACETAMINOPHEN 7.5-325 MG PO TABS: ORAL_TABLET | ORAL | Status: DC

## 2016-05-06 NOTE — Progress Notes (Signed)
Subjective:    Patient ID: [name], female    DOB: 1966-05-17, 50 y.o.   MRN: [id number]  HPI: [name] is a 50 year old female who returns for follow up for chronic pain and medication refill. She states her pain is located in her left arm. Also states she is having generalized pain all over.She rates her pain 9. Her current exercise regime is walking short distances in her home.   [name] states she  has been under a lot of stress due to financial hardship and dealing with her medical issues/bills. She admits to having a glass of wine on July 4,2017, we reviewed the narcotic policy she verbalizes understanding.  Pain Inventory Average Pain 9 Pain Right Now 9 My pain is constant, sharp, burning, stabbing, tingling and aching  In the last 24 hours, has pain interfered with the following? General activity 7 Relation with others 7 Enjoyment of life 7 What TIME of day is your pain at its worst? all Sleep (in general) Poor  Pain is worse with: walking, bending, sitting, inactivity, standing and some activites Pain improves with: rest, heat/ice, therapy/exercise, pacing activities, medication, TENS and injections Relief from Meds: 6  Mobility walk without assistance ability to climb steps?  yes do you drive?  yes  Function disabled: date disabled . I need assistance with the following:  meal prep, household duties and shopping  Neuro/Psych bladder control problems weakness numbness tremor tingling trouble walking spasms dizziness depression anxiety  Prior Studies Any changes since last visit?  no  Physicians involved in your care Any changes since last visit?  no   Family History  Problem Relation Age of Onset  . Cancer Mother     lung  . Hypertension Mother   . Cancer Father     colon  . Hypertension Sister   . Cancer Sister   . Birth defects Maternal Grandmother     breast  . Birth defects Paternal Grandmother     uterine,  stomach, lung  . Cancer Brother   . Hyperlipidemia Sister    Social History   Social History  . Marital Status: Married    Spouse Name: N/A  . Number of Children: 2  . Years of Education: N/A   Occupational History  . Disabled due to RSD    Social History Main Topics  . Smoking status: Never Smoker   . Smokeless tobacco: Never Used  . Alcohol Use: 0.0 oz/week    0 Standard drinks or equivalent per week     Comment: 01/24/2013 "drink or 2 once or /twice/year, if that"  . Drug Use: No  . Sexual Activity: Not Currently   Other Topics Concern  . None   Social History Narrative   Past Surgical History  Procedure Laterality Date  . Oophorectomy Right 2009  . Laparoscopic incisional / umbilical / ventral hernia repair  01/24/2013    IHR w/mesh/notes  . Hernia repair    . Tonsillectomy  1990's  . Nasal septum surgery  1980's?  . Appendectomy  ~ 06/2007  . Bladder repair  ~ 06/2007    "same day after bladder lift" (01/24/2013)  . Bladder suspension  ~ 06/2007  . Vaginal hysterectomy  ` 06/2007  . Diagnostic laparoscopy  1990's & ~ 2000    "I've had a couple; for endometrosis" (01/24/2013)  . Incisional hernia repair N/A 01/24/2013    Procedure: LAPAROSCOPIC INCISIONAL HERNIA;  Surgeon: Harl Bowie, MD;  Location: [facility]  OR;  Service: General;  Laterality: N/A;  . Insertion of mesh N/A 01/24/2013    Procedure: INSERTION OF MESH;  Surgeon: Harl Bowie, MD;  Location: [place];  Service: General;  Laterality: N/A;   Past Medical History  Diagnosis Date  . Anxiety   . Asthma   . History of colonic polyps   . GERD (gastroesophageal reflux disease)   . Hypertension   . Allergy   . Hyperlipidemia   . ASCVD (arteriosclerotic cardiovascular disease)   . Obesity (BMI 30.0-34.9)   . Sleep apnea     NO MACHINE RECOMMENDED  . TIA (transient ischemic attack) ~ 2009  . Migraines   . Daily headache     "depends on the season" (01/24/2013)  . Arthritis     "just the norm" (01/24/2013)  .  Fibromyalgia   . RSD (reflex sympathetic dystrophy)    BP 115/79 mmHg  Pulse 68  Resp 14  SpO2 96%  Opioid Risk Score:   Fall Risk Score:  `1  Depression screen PHQ 2/9  Depression screen [facility] 2/9 11/26/2015 06/25/2015  Decreased Interest 3 3  Down, Depressed, Hopeless 2 2  PHQ - 2 Score 5 5  Altered sleeping - 3  Tired, decreased energy - 3  Change in appetite - 2  Feeling bad or failure about yourself  - 3  Trouble concentrating - 2  Moving slowly or fidgety/restless - 0  Suicidal thoughts - 1  PHQ-9 Score - 19  Difficult doing work/chores - Somewhat difficult     Review of Systems  Respiratory: Negative.   Cardiovascular: Negative.   Gastrointestinal: Negative.   Musculoskeletal: Positive for gait problem.       Spasms   Neurological: Positive for dizziness, tremors, weakness and numbness.       Tingling   Psychiatric/Behavioral: Positive for dysphoric mood. The patient is nervous/anxious.   All other systems reviewed and are negative.      Objective:   Physical Exam  Constitutional: She is oriented to person, place, and time. She appears well-developed and well-nourished.  HENT:  Head: Normocephalic and atraumatic.  Neck: Normal range of motion. Neck supple.  Cardiovascular: Normal rate and regular rhythm.   Pulmonary/Chest: Effort normal and breath sounds normal.  Musculoskeletal:  Normal Muscle Bulk and Muscle Testing Reveals: Upper Extremities: Right: Full ROM and Muscle Strength 5/5 Left: Decreased ROM 90 Degrees and Muscle Strength 3/5 Lower Extremities: Full ROM and Muscle Strength 4/5 Arises from Table Slowly Antalgic Gait  Neurological: She is alert and oriented to person, place, and time.  Skin: Skin is warm and dry.  Psychiatric: She has a normal mood and affect.  Nursing note and vitals reviewed.         Assessment & Plan:  1. Complex Regional Pain Syndrome Type 1: Continue Topamax Refilled: Hydro-codone 7.5/325mg one tablet twice a  day one in the morning and one at bedtime #60. Continue:Tramadol 100 mg ER daily #30 and Tramadol 50 mg one tablet by mouth BID. One tablet in the afternoon and one tablet in the evening. We will continue the opioid monitoring program, this consists of regular clinic visits, examinations, urine drug screen, pill counts as well as use of [place] Controlled Substance Reporting System. 2. Depression: Continue Cymbalta 3. Insomnia: ContinueTrazodone50 mg HS. 4. Muscle Spasms: Continue Tizanidine  20 minutes of face to face patient care time was spent during this visit. All questions were encouraged and answered.   F/U in 1 month

## 2016-05-07 ENCOUNTER — Encounter: Payer: Self-pay | Admitting: Physical Medicine & Rehabilitation

## 2016-05-11 ENCOUNTER — Other Ambulatory Visit: Payer: Self-pay | Admitting: Internal Medicine

## 2016-05-15 LAB — TOXASSURE SELECT,+ANTIDEPR,UR: PDF: 0

## 2016-05-17 NOTE — Progress Notes (Signed)
Urine drug screen for this encounter is consistent for prescribed medications.   

## 2016-05-26 ENCOUNTER — Telehealth: Payer: Self-pay | Admitting: *Deleted

## 2016-05-26 MED ORDER — IBUPROFEN 600 MG PO TABS: 600.0000 mg | ORAL_TABLET | Freq: Three times a day (TID) | ORAL | 8 refills | Status: DC | PRN

## 2016-05-26 NOTE — Telephone Encounter (Signed)
Ok to change to 670m TID

## 2016-05-26 NOTE — Telephone Encounter (Signed)
rec'd fax requesting refill for Ibuprofen 600 mg tab #90, refills 8, sig states take 1 tablet by mouth 3 times daily.  This medication is on her active list at 400 mg TID not 600 mg. Can we make the change and reorder for patient?

## 2016-05-31 ENCOUNTER — Other Ambulatory Visit: Payer: Self-pay | Admitting: Registered Nurse

## 2016-06-04 ENCOUNTER — Telehealth: Payer: Self-pay | Admitting: Physical Medicine & Rehabilitation

## 2016-06-04 MED ORDER — CYMBALTA 30 MG PO CPEP: ORAL_CAPSULE | ORAL | 2 refills | Status: DC

## 2016-06-04 NOTE — Telephone Encounter (Signed)
Re ordered

## 2016-06-04 NOTE — Telephone Encounter (Signed)
Vicente Males with [facility] needs to get a refill request for patient--Cymbalta brand name..  Any questions please call her at [phone].

## 2016-06-07 ENCOUNTER — Ambulatory Visit: Payer: Self-pay | Admitting: Registered Nurse

## 2016-06-17 ENCOUNTER — Encounter: Payer: Self-pay | Admitting: Registered Nurse

## 2016-06-17 ENCOUNTER — Encounter: Payer: Worker's Compensation | Attending: Physical Medicine & Rehabilitation | Admitting: Registered Nurse

## 2016-06-17 VITALS — BP 123/77 | HR 77 | Resp 17

## 2016-06-17 DIAGNOSIS — G894 Chronic pain syndrome: Secondary | ICD-10-CM | POA: Diagnosis not present

## 2016-06-17 DIAGNOSIS — G90522 Complex regional pain syndrome I of left lower limb: Secondary | ICD-10-CM | POA: Diagnosis not present

## 2016-06-17 DIAGNOSIS — Z5181 Encounter for therapeutic drug level monitoring: Secondary | ICD-10-CM

## 2016-06-17 DIAGNOSIS — G90512 Complex regional pain syndrome I of left upper limb: Secondary | ICD-10-CM

## 2016-06-17 DIAGNOSIS — Z79899 Other long term (current) drug therapy: Secondary | ICD-10-CM | POA: Diagnosis not present

## 2016-06-17 MED ORDER — TRAZODONE HCL 50 MG PO TABS: ORAL_TABLET | ORAL | 2 refills | Status: DC

## 2016-06-17 MED ORDER — HYDROCODONE-ACETAMINOPHEN 7.5-325 MG PO TABS: ORAL_TABLET | ORAL | 0 refills | Status: DC

## 2016-06-17 NOTE — Progress Notes (Signed)
Subjective:    Patient ID: [name], female    DOB: [birth year], 50 y.o.   MRN: [id number]  HPI:  [name] is a 50 year old female who returns for follow up for chronic pain and medication refill. She states her pain is located in her left arm. She rates her pain 9. Her current exercise regime is walking short distances in her home.  Also states her pharmacy change manufactures for her Topamax, she has been experiencing itching, she used benadryl. She will be calling the pharmacist she states.  Pain Inventory Average Pain 8 Pain Right Now 9 My pain is constant, sharp, burning, stabbing, tingling and aching  In the last 24 hours, has pain interfered with the following? General activity NA Relation with others NA Enjoyment of life NA What TIME of day is your pain at its worst? constant  Sleep (in general) Poor  Pain is worse with: walking, bending, sitting and standing Pain improves with: rest, heat/ice, therapy/exercise, pacing activities, TENS and injections Relief from Meds: 6  Mobility ability to climb steps?  yes do you drive?  yes Do you have any goals in this area?  yes  Function disabled: date disabled 12/94 I need assistance with the following:  meal prep, household duties and shopping  Neuro/Psych bladder control problems weakness numbness tremor tingling trouble walking spasms dizziness depression anxiety  Prior Studies Any changes since last visit?  no  Physicians involved in your care Any changes since last visit?  no   Family History  Problem Relation Age of Onset  . Cancer Mother     lung  . Hypertension Mother   . Cancer Father     colon  . Hypertension Sister   . Cancer Sister   . Cancer Brother   . Hyperlipidemia Sister   . Birth defects Maternal Grandmother     breast  . Birth defects Paternal Grandmother     uterine, stomach, lung   Social History   Social History  . Marital status: Married    Spouse name:  N/A  . Number of children: 2  . Years of education: N/A   Occupational History  . Disabled due to RSD    Social History Main Topics  . Smoking status: Never Smoker  . Smokeless tobacco: Never Used  . Alcohol use 0.0 oz/week     Comment: 01/24/2013 "drink or 2 once or /twice/year, if that"  . Drug use: No  . Sexual activity: Not Currently   Other Topics Concern  . None   Social History Narrative  . None   Past Surgical History:  Procedure Laterality Date  . APPENDECTOMY  ~ 06/2007  . BLADDER REPAIR  ~ 06/2007   "same day after bladder lift" (01/24/2013)  . BLADDER SUSPENSION  ~ 06/2007  . DIAGNOSTIC LAPAROSCOPY  1990's & ~ 2000   "I've had a couple; for endometrosis" (01/24/2013)  . HERNIA REPAIR    . INCISIONAL HERNIA REPAIR N/A 01/24/2013   Procedure: LAPAROSCOPIC INCISIONAL HERNIA;  Surgeon: Harl Bowie, MD;  Location: [place];  Service: General;  Laterality: N/A;  . INSERTION OF MESH N/A 01/24/2013   Procedure: INSERTION OF MESH;  Surgeon: Harl Bowie, MD;  Location: [place];  Service: General;  Laterality: N/A;  . LAPAROSCOPIC INCISIONAL / UMBILICAL / [place]  01/24/2013   IHR w/mesh/notes  . NASAL SEPTUM SURGERY  1980's?  . OOPHORECTOMY Right 2009  . TONSILLECTOMY  1990's  . VAGINAL  HYSTERECTOMY  ` 06/2007   Past Medical History:  Diagnosis Date  . Allergy   . Anxiety   . Arthritis    "just the norm" (01/24/2013)  . ASCVD (arteriosclerotic cardiovascular disease)   . Asthma   . Daily headache    "depends on the season" (01/24/2013)  . Fibromyalgia   . GERD (gastroesophageal reflux disease)   . History of colonic polyps   . Hyperlipidemia   . Hypertension   . Migraines   . Obesity (BMI 30.0-34.9)   . RSD (reflex sympathetic dystrophy)   . Sleep apnea    NO MACHINE RECOMMENDED  . TIA (transient ischemic attack) ~ 2009   BP 123/77   Pulse 77   Resp 17   SpO2 96%   Opioid Risk Score:   Fall Risk Score:  `1  Depression screen PHQ  2/9  Depression screen [facility] 2/9 11/26/2015 06/25/2015  Decreased Interest 3 3  Down, Depressed, Hopeless 2 2  PHQ - 2 Score 5 5  Altered sleeping - 3  Tired, decreased energy - 3  Change in appetite - 2  Feeling bad or failure about yourself  - 3  Trouble concentrating - 2  Moving slowly or fidgety/restless - 0  Suicidal thoughts - 1  PHQ-9 Score - 19  Difficult doing work/chores - Somewhat difficult  Some recent data might be hidden    Review of Systems  Constitutional:       Bladder control problems   Musculoskeletal: Positive for gait problem.  Neurological: Positive for dizziness, tremors, weakness and numbness.       Tingling Spasms   Psychiatric/Behavioral: Positive for dysphoric mood. The patient is nervous/anxious.   All other systems reviewed and are negative.      Objective:   Physical Exam  Constitutional: She is oriented to person, place, and time. She appears well-developed and well-nourished.  HENT:  Head: Normocephalic and atraumatic.  Neck: Normal range of motion. Neck supple.  Cardiovascular: Normal rate and regular rhythm.   Pulmonary/Chest: Effort normal and breath sounds normal.  Musculoskeletal:  Normal Muscle Bulk and Muscle Testing Reveals: Upper Extremities: Right: Full ROM and Muscle Strength 5/5 Left: Decreased ROM 90 Degrees and Muscle Strength 3/5 Lower Extremities: Full ROM and Muscle Strength 5/5 Arises from Table slowly Narrow Based Gait  Neurological: She is alert and oriented to person, place, and time.  Skin: Skin is warm and dry.  Psychiatric: She has a normal mood and affect.  Nursing note and vitals reviewed.         Assessment & Plan:  1. Complex Regional Pain Syndrome Type 1: Continue Topamax and Cymbalta Refilled: Hydro-codone 7.5/325mg one tablet twice a day one in the morning and one at bedtime #60. Continue:Tramadol 100 mg ER daily #30 and Tramadol 50 mg one tablet by mouth BID. One tablet in the afternoon and one tablet  in the evening. We will continue the opioid monitoring program, this consists of regular clinic visits, examinations, urine drug screen, pill counts as well as use of [place] Controlled Substance Reporting System. 2. Depression: Continue Cymbalta 3. Insomnia: ContinueTrazodone50 mg HS. 4. Muscle Spasms: Continue Tizanidine  20 minutes of face to face patient care time was spent during this visit. All questions were encouraged and answered.   F/U in 1 month

## 2016-07-12 ENCOUNTER — Telehealth: Payer: Self-pay | Admitting: Physical Medicine & Rehabilitation

## 2016-07-12 NOTE — Telephone Encounter (Signed)
Patient is needing prior approval for her Tramadol.  Please call her when this is complete.

## 2016-07-13 NOTE — Telephone Encounter (Addendum)
I spoke with the pharmacist and he did not find that there was a need for PA. She has gotten both this month. I spoke with Rubie again and she has her medication but she was without her both tramadols for about a week and has had diarrhea and feels bad. She has problems often with case management not approving medications in appropriate time frames.  She is also going to have to go through another IME again but she is working with her attorneys.

## 2016-07-14 ENCOUNTER — Encounter: Payer: Worker's Compensation | Attending: Physical Medicine & Rehabilitation | Admitting: Registered Nurse

## 2016-07-14 ENCOUNTER — Encounter: Payer: Self-pay | Admitting: Registered Nurse

## 2016-07-14 VITALS — BP 113/74 | HR 81

## 2016-07-14 DIAGNOSIS — G894 Chronic pain syndrome: Secondary | ICD-10-CM | POA: Diagnosis not present

## 2016-07-14 DIAGNOSIS — Z79899 Other long term (current) drug therapy: Secondary | ICD-10-CM

## 2016-07-14 DIAGNOSIS — G90512 Complex regional pain syndrome I of left upper limb: Secondary | ICD-10-CM | POA: Insufficient documentation

## 2016-07-14 DIAGNOSIS — Z5181 Encounter for therapeutic drug level monitoring: Secondary | ICD-10-CM

## 2016-07-14 DIAGNOSIS — G90522 Complex regional pain syndrome I of left lower limb: Secondary | ICD-10-CM | POA: Insufficient documentation

## 2016-07-14 MED ORDER — LIDOCAINE 5 % EX PTCH: MEDICATED_PATCH | CUTANEOUS | 2 refills | Status: DC

## 2016-07-14 MED ORDER — HYDROCODONE-ACETAMINOPHEN 7.5-325 MG PO TABS: ORAL_TABLET | ORAL | 0 refills | Status: DC

## 2016-07-14 NOTE — Progress Notes (Signed)
Subjective:    Patient ID: [name], female    DOB: Feb 09, 1966, 50 y.o.   MRN: [id number]  HPI: [name] is a 50 year old female who returns for follow up for chronic pain and medication refill. She states her pain is located in her left arm. She rates her pain 10. Her current exercise regime is walking short distances in her home.   Pain Inventory Average Pain 8 Pain Right Now 10 My pain is sharp, burning, stabbing, tingling and aching  In the last 24 hours, has pain interfered with the following? General activity 9 Relation with others 9 Enjoyment of life 9 What TIME of day is your pain at its worst? daytime, evening Sleep (in general) Poor  Pain is worse with: walking, bending, sitting, inactivity and standing Pain improves with: rest, heat/ice, therapy/exercise, pacing activities, medication and TENS Relief from Meds: fair  Mobility how many minutes can you walk? 0 ability to climb steps?  yes do you drive?  yes Do you have any goals in this area?  no  Function disabled: date disabled 09/1993 I need assistance with the following:  meal prep and household duties Do you have any goals in this area?  no  Neuro/Psych bladder control problems weakness numbness tremor tingling trouble walking spasms dizziness depression anxiety loss of taste or smell  Prior Studies Any changes since last visit?  no  Physicians involved in your care Any changes since last visit?  no   Family History  Problem Relation Age of Onset  . Cancer Mother     lung  . Hypertension Mother   . Cancer Father     colon  . Hypertension Sister   . Cancer Sister   . Cancer Brother   . Hyperlipidemia Sister   . Birth defects Maternal Grandmother     breast  . Birth defects Paternal Grandmother     uterine, stomach, lung   Social History   Social History  . Marital status: Married    Spouse name: N/A  . Number of children: 2  . Years of education: N/A    Occupational History  . Disabled due to RSD    Social History Main Topics  . Smoking status: Never Smoker  . Smokeless tobacco: Never Used  . Alcohol use 0.0 oz/week     Comment: 01/24/2013 "drink or 2 once or /twice/year, if that"  . Drug use: No  . Sexual activity: Not Currently   Other Topics Concern  . Not on file   Social History Narrative  . No narrative on file   Past Surgical History:  Procedure Laterality Date  . APPENDECTOMY  ~ 06/2007  . BLADDER REPAIR  ~ 06/2007   "same day after bladder lift" (01/24/2013)  . BLADDER SUSPENSION  ~ 06/2007  . DIAGNOSTIC LAPAROSCOPY  1990's & ~ 2000   "I've had a couple; for endometrosis" (01/24/2013)  . HERNIA REPAIR    . INCISIONAL HERNIA REPAIR N/A 01/24/2013   Procedure: LAPAROSCOPIC INCISIONAL HERNIA;  Surgeon: Shelly Rubenstein, MD;  Location[facility];  Service: General;  Laterality: N/A;  . INSERTION OF MESH N/A 01/24/2013   Procedure: INSERTION OF MESH;  Surgeon: Shelly Rubenstein, MD;  Location: [facility] OR;  Service: General;  Laterality: N/A;  . LAPAROSCOPIC INCISIONAL / UMBILICAL / [facility] HERNIA REPAIR  01/24/2013   IHR w/mesh/notes  . NASAL SEPTUM SURGERY  1980's?  . OOPHORECTOMY Right 2009  . TONSILLECTOMY  1990's  . VAGINAL  HYSTERECTOMY  ` 06/2007   Past Medical History:  Diagnosis Date  . Allergy   . Anxiety   . Arthritis    "just the norm" (01/24/2013)  . ASCVD (arteriosclerotic cardiovascular disease)   . Asthma   . Daily headache    "depends on the season" (01/24/2013)  . Fibromyalgia   . GERD (gastroesophageal reflux disease)   . History of colonic polyps   . Hyperlipidemia   . Hypertension   . Migraines   . Obesity (BMI 30.0-34.9)   . RSD (reflex sympathetic dystrophy)   . Sleep apnea    NO MACHINE RECOMMENDED  . TIA (transient ischemic attack) ~ 2009   There were no vitals taken for this visit.  Opioid Risk Score:   Fall Risk Score:  `1  Depression screen PHQ 2/9  [facility] 11/26/2015 06/25/2015   Decreased Interest 3 3  Down, Depressed, Hopeless 2 2  PHQ - 2 Score 5 5  Altered sleeping - 3  Tired, decreased energy - 3  Change in appetite - 2  Feeling bad or failure about yourself  - 3  Trouble concentrating - 2  Moving slowly or fidgety/restless - 0  Suicidal thoughts - 1  PHQ-9 Score - 19  Difficult doing work/chores - Somewhat difficult  Some recent data might be hidden     Review of Systems  Neurological: Positive for dizziness, tremors, weakness and numbness.       Tingling Spasms  Psychiatric/Behavioral: Positive for dysphoric mood. The patient is nervous/anxious.   All other systems reviewed and are negative.      Objective:   Physical Exam  Constitutional: She is oriented to person, place, and time. She appears well-developed and well-nourished.  HENT:  Head: Normocephalic and atraumatic.  Neck: Normal range of motion. Neck supple.  Cardiovascular: Normal rate and regular rhythm.   Pulmonary/Chest: Effort normal and breath sounds normal.  Musculoskeletal:  Normal Muscle Bulk and Muscle Testing Reveals: Upper Extremities: Right:Full ROM and Muscle Strength 5/5 Left: Decreased ROM 90 Degrees and Muscle Strength 5/5 Lower Extremities: Full ROM and Muscle Strength 5/5 Arises from table slowly Antalgic gait  Neurological: She is alert and oriented to person, place, and time.  Skin: Skin is warm and dry.  Psychiatric: She has a normal mood and affect.  Nursing note and vitals reviewed.         Assessment & Plan:  1. Complex Regional Pain Syndrome Type 1: Continue Topamax and Cymbalta Refilled:Hydro-codone 7.5/325mg  one tablet twice a day one in the morning and one at bedtime#60. Continue:Tramadol 100 mg ER daily #30 and Tramadol 50 mg one tablet by mouth BID. One tablet in the afternoon and one tablet in the evening. We will continue the opioid monitoring program, this consists of regular clinic visits, examinations, urine drug screen, pill counts as  well as use of [place] Controlled Substance Reporting System. 2. Depression: Continue Cymbalta 3. Insomnia: ContinueTrazodone50 mg HS. 4. Muscle Spasms: Continue Tizanidine  20 minutes of face to face patient care time was spent during this visit. All questions were encouraged and answered.   F/U in 1 month

## 2016-07-18 ENCOUNTER — Other Ambulatory Visit: Payer: Self-pay | Admitting: Internal Medicine

## 2016-08-13 ENCOUNTER — Ambulatory Visit: Payer: Self-pay | Admitting: Physical Medicine & Rehabilitation

## 2016-08-16 ENCOUNTER — Ambulatory Visit: Payer: Self-pay | Admitting: Registered Nurse

## 2016-08-19 ENCOUNTER — Encounter: Payer: Self-pay | Admitting: Registered Nurse

## 2016-08-19 ENCOUNTER — Encounter: Payer: Worker's Compensation | Attending: Physical Medicine & Rehabilitation | Admitting: Registered Nurse

## 2016-08-19 VITALS — BP 123/80 | HR 72 | Resp 14 | Wt 172.4 lb

## 2016-08-19 DIAGNOSIS — G9050 Complex regional pain syndrome I, unspecified: Secondary | ICD-10-CM

## 2016-08-19 DIAGNOSIS — G894 Chronic pain syndrome: Secondary | ICD-10-CM

## 2016-08-19 DIAGNOSIS — Z5181 Encounter for therapeutic drug level monitoring: Secondary | ICD-10-CM | POA: Diagnosis not present

## 2016-08-19 DIAGNOSIS — Z79899 Other long term (current) drug therapy: Secondary | ICD-10-CM | POA: Diagnosis not present

## 2016-08-19 DIAGNOSIS — G90522 Complex regional pain syndrome I of left lower limb: Secondary | ICD-10-CM | POA: Diagnosis not present

## 2016-08-19 DIAGNOSIS — G90512 Complex regional pain syndrome I of left upper limb: Secondary | ICD-10-CM | POA: Diagnosis not present

## 2016-08-19 MED ORDER — HYDROCODONE-ACETAMINOPHEN 7.5-325 MG PO TABS: ORAL_TABLET | ORAL | 0 refills | Status: DC

## 2016-08-19 MED ORDER — TRAMADOL HCL 50 MG PO TABS: ORAL_TABLET | ORAL | 4 refills | Status: DC

## 2016-08-19 MED ORDER — TRAMADOL HCL ER 100 MG PO TB24: 100.0000 mg | ORAL_TABLET | Freq: Every day | ORAL | 3 refills | Status: DC

## 2016-08-19 NOTE — Progress Notes (Signed)
Subjective:    Patient ID: [name], female    DOB: 10-12-1966, 50 y.o.   MRN: [id number]  HPI:  [name] is a 50 year old female who returns for follow up for chronic pain and medication refill. She states her pain is located in her left arm. She rates her pain 8. Her current exercise regime is walking short distances in her home.  Also states she fell on 10/13 she was walking up some stairs at an auditorium when she lost her footing and fell forward onto the bleachers. Pedestrians helped her up she didn't seek medical attention. Educated on falls prevention she verbalizes understanding.   Pain Inventory Average Pain 10 Pain Right Now 8 My pain is constant, sharp, burning, stabbing, tingling and aching  In the last 24 hours, has pain interfered with the following? General activity 7 Relation with others 7 Enjoyment of life 7 What TIME of day is your pain at its worst? All Sleep (in general) Poor  Pain is worse with: walking, bending, sitting, inactivity, standing and some activites Pain improves with: rest, heat/ice, therapy/exercise, pacing activities, medication, TENS and injections Relief from Meds: 6  Mobility ability to climb steps?  yes do you drive?  yes  Function disabled: date disabled 12/94 I need assistance with the following:  meal prep, household duties and shopping  Neuro/Psych bladder control problems weakness numbness tremor tingling trouble walking spasms dizziness depression anxiety  Prior Studies Any changes since last visit?  no  Physicians involved in your care Any changes since last visit?  no   Family History  Problem Relation Age of Onset  . Cancer Mother     lung  . Hypertension Mother   . Cancer Father     colon  . Hypertension Sister   . Cancer Sister   . Cancer Brother   . Hyperlipidemia Sister   . Birth defects Maternal Grandmother     breast  . Birth defects Paternal Grandmother     uterine, stomach,  lung   Social History   Social History  . Marital status: Married    Spouse name: N/A  . Number of children: 2  . Years of education: N/A   Occupational History  . Disabled due to RSD    Social History Main Topics  . Smoking status: Never Smoker  . Smokeless tobacco: Never Used  . Alcohol use 0.0 oz/week     Comment: 01/24/2013 "drink or 2 once or /twice/year, if that"  . Drug use: No  . Sexual activity: Not Currently   Other Topics Concern  . Not on file   Social History Narrative  . No narrative on file   Past Surgical History:  Procedure Laterality Date  . APPENDECTOMY  ~ 06/2007  . BLADDER REPAIR  ~ 06/2007   "same day after bladder lift" (01/24/2013)  . BLADDER SUSPENSION  ~ 06/2007  . DIAGNOSTIC LAPAROSCOPY  1990's & ~ 2000   "I've had a couple; for endometrosis" (01/24/2013)  . HERNIA REPAIR    . INCISIONAL HERNIA REPAIR N/A 01/24/2013   Procedure: LAPAROSCOPIC INCISIONAL HERNIA;  Surgeon: Harl Bowie, MD;  Location: [place];  Service: General;  Laterality: N/A;  . INSERTION OF MESH N/A 01/24/2013   Procedure: INSERTION OF MESH;  Surgeon: Harl Bowie, MD;  Location: [place];  Service: General;  Laterality: N/A;  . LAPAROSCOPIC INCISIONAL / UMBILICAL / [place]  01/24/2013   IHR w/mesh/notes  . NASAL SEPTUM  SURGERY  1980's?  . OOPHORECTOMY Right 2009  . TONSILLECTOMY  1990's  . VAGINAL HYSTERECTOMY  ` 06/2007   Past Medical History:  Diagnosis Date  . Allergy   . Anxiety   . Arthritis    "just the norm" (01/24/2013)  . ASCVD (arteriosclerotic cardiovascular disease)   . Asthma   . Daily headache    "depends on the season" (01/24/2013)  . Fibromyalgia   . GERD (gastroesophageal reflux disease)   . History of colonic polyps   . Hyperlipidemia   . Hypertension   . Migraines   . Obesity (BMI 30.0-34.9)   . RSD (reflex sympathetic dystrophy)   . Sleep apnea    NO MACHINE RECOMMENDED  . TIA (transient ischemic attack) ~ 2009   BP 123/80   Pulse  72   Resp 14   Wt 172 lb 6.4 oz (78.2 kg)   SpO2 97%   BMI 26.60 kg/m   Opioid Risk Score:   Fall Risk Score:  `1  Depression screen PHQ 2/9  Depression screen [facility] 2/9 11/26/2015 06/25/2015  Decreased Interest 3 3  Down, Depressed, Hopeless 2 2  PHQ - 2 Score 5 5  Altered sleeping - 3  Tired, decreased energy - 3  Change in appetite - 2  Feeling bad or failure about yourself  - 3  Trouble concentrating - 2  Moving slowly or fidgety/restless - 0  Suicidal thoughts - 1  PHQ-9 Score - 19  Difficult doing work/chores - Somewhat difficult  Some recent data might be hidden    Review of Systems  All other systems reviewed and are negative.      Objective:   Physical Exam  Constitutional: She is oriented to person, place, and time. She appears well-developed and well-nourished.  HENT:  Head: Normocephalic and atraumatic.  Neck: Normal range of motion. Neck supple.  Cardiovascular: Normal rate and regular rhythm.   Pulmonary/Chest: Effort normal and breath sounds normal.  Musculoskeletal:  Normal Muscle Bulk and Muscle Testing Reveals: Upper Extremities: Right: Full ROM and Muscle Strength 5/5 Left: Decreased ROM 45 Degrees and Muscle Strength 3/5 Left AC Joint Tenderness  Thoracic Paraspinal Tenderness: T-1- T-3  Lower Extremities: Full ROM and Muscle Strength 4/5 Arises from chair slowly Narrow Based Gait   Neurological: She is alert and oriented to person, place, and time.  Skin: Skin is warm and dry.  Psychiatric: She has a normal mood and affect.  Nursing note and vitals reviewed.         Assessment & Plan:  1. Complex Regional Pain Syndrome Type 1: Continue Topamax and Cymbalta Refilled:Hydro-codone 7.5/325mg one tablet twice a day one in the morning and one at bedtime#60. Continue:Tramadol 100 mg ER daily #30 and Tramadol 50 mg one tablet by mouth BID. One tablet in the afternoon and one tablet in the evening. We will continue the opioid monitoring  program, this consists of regular clinic visits, examinations, urine drug screen, pill counts as well as use of [place] Controlled Substance Reporting System. 2. Depression: Continue Cymbalta 3. Insomnia: ContinueTrazodone50 mg HS. 4. Muscle Spasms: Continue Tizanidine  20 minutes of face to face patient care time was spent during this visit. All questions were encouraged and answered.   F/U in 1 month

## 2016-09-16 ENCOUNTER — Ambulatory Visit: Payer: Self-pay | Admitting: Physical Medicine & Rehabilitation

## 2016-09-23 ENCOUNTER — Encounter: Payer: Worker's Compensation | Attending: Physical Medicine & Rehabilitation

## 2016-09-23 ENCOUNTER — Ambulatory Visit (HOSPITAL_BASED_OUTPATIENT_CLINIC_OR_DEPARTMENT_OTHER): Payer: Worker's Compensation | Admitting: Physical Medicine & Rehabilitation

## 2016-09-23 ENCOUNTER — Encounter: Payer: Self-pay | Admitting: Physical Medicine & Rehabilitation

## 2016-09-23 VITALS — BP 118/76 | HR 75

## 2016-09-23 DIAGNOSIS — Z5181 Encounter for therapeutic drug level monitoring: Secondary | ICD-10-CM

## 2016-09-23 DIAGNOSIS — G9050 Complex regional pain syndrome I, unspecified: Secondary | ICD-10-CM

## 2016-09-23 DIAGNOSIS — G90522 Complex regional pain syndrome I of left lower limb: Secondary | ICD-10-CM | POA: Diagnosis present

## 2016-09-23 DIAGNOSIS — G90512 Complex regional pain syndrome I of left upper limb: Secondary | ICD-10-CM | POA: Insufficient documentation

## 2016-09-23 DIAGNOSIS — Z79899 Other long term (current) drug therapy: Secondary | ICD-10-CM | POA: Diagnosis not present

## 2016-09-23 MED ORDER — HYDROCODONE-ACETAMINOPHEN 7.5-325 MG PO TABS: ORAL_TABLET | ORAL | 0 refills | Status: DC

## 2016-09-23 NOTE — Patient Instructions (Signed)
Cont exercise bike, May try Crocs with padding

## 2016-09-23 NOTE — Progress Notes (Signed)
Subjective:    Patient ID: [name], female    DOB: January 13, 1966, 50 y.o.   MRN: [id number]  HPI  50 year old female who had a work-related injury 1994 with left wrist sprain and developed left wrist swelling, redness, and upper extremity pain. He was treated in [facility] system. Seen by multiple physicians. Was diagnosed with reflex sympathetic dystrophy, has been followed in our physical medicine and rehabilitation clinic since 2006. Initially, on high-dose narcotic analgesics weaned down to low dose. Pt discussed IME stated that Dr told her she had RSD but report said otherwise.   Burning pain in both feet, the symptoms originated around 2006, have worsened over time. Has trialed tibial nerve blocks without much help.  Bilateral venous Dopplers 04/18/2014 were normal  Patient states that the IME physician recommended trial spinal cord stimulation. However, report did not indicate this.  Patient's pain is increased today. She states she slept poorly. She feels the cold weather exacerbates her pain symptoms.  Doing lavender and epsom salt soaks Discussed numerous other treatment alternatives, including compounded creams, IV ketamine,  Pain Inventory Average Pain 9 Pain Right Now 9 My pain is constant, sharp, burning, dull, stabbing, tingling and aching  In the last 24 hours, has pain interfered with the following? General activity 10 Relation with others 10 Enjoyment of life 10 What TIME of day is your pain at its worst? all Sleep (in general) Poor  Pain is worse with: walking, bending, sitting, standing, unsure and some activites Pain improves with: rest, heat/ice, therapy/exercise, pacing activities, medication, TENS and injections Relief from Meds: 7  Mobility walk without assistance ability to climb steps?  yes do you drive?  yes  Function disabled: date disabled 69 I need assistance with the following:  meal prep, household duties and  shopping  Neuro/Psych bladder control problems weakness numbness tremor tingling trouble walking spasms dizziness depression anxiety  Prior Studies Any changes since last visit?  no  Physicians involved in your care Any changes since last visit?  no   Family History  Problem Relation Age of Onset  . Cancer Mother     lung  . Hypertension Mother   . Cancer Father     colon  . Hypertension Sister   . Cancer Sister   . Cancer Brother   . Hyperlipidemia Sister   . Birth defects Maternal Grandmother     breast  . Birth defects Paternal Grandmother     uterine, stomach, lung   Social History   Social History  . Marital status: Married    Spouse name: N/A  . Number of children: 2  . Years of education: N/A   Occupational History  . Disabled due to RSD    Social History Main Topics  . Smoking status: Never Smoker  . Smokeless tobacco: Never Used  . Alcohol use 0.0 oz/week     Comment: 01/24/2013 "drink or 2 once or /twice/year, if that"  . Drug use: No  . Sexual activity: Not Currently   Other Topics Concern  . Not on file   Social History Narrative  . No narrative on file   Past Surgical History:  Procedure Laterality Date  . APPENDECTOMY  ~ 06/2007  . BLADDER REPAIR  ~ 06/2007   "same day after bladder lift" (01/24/2013)  . BLADDER SUSPENSION  ~ 06/2007  . DIAGNOSTIC LAPAROSCOPY  1990's & ~ 2000   "I've had a couple; for endometrosis" (01/24/2013)  . HERNIA REPAIR    .  INCISIONAL HERNIA REPAIR N/A 01/24/2013   Procedure: LAPAROSCOPIC INCISIONAL HERNIA;  Surgeon: Harl Bowie, MD;  Location: [place];  Service: General;  Laterality: N/A;  . INSERTION OF MESH N/A 01/24/2013   Procedure: INSERTION OF MESH;  Surgeon: Harl Bowie, MD;  Location: [place];  Service: General;  Laterality: N/A;  . LAPAROSCOPIC INCISIONAL / UMBILICAL / [place]  01/24/2013   IHR w/mesh/notes  . NASAL SEPTUM SURGERY  1980's?  . OOPHORECTOMY Right 2009  .  TONSILLECTOMY  1990's  . VAGINAL HYSTERECTOMY  ` 06/2007   Past Medical History:  Diagnosis Date  . Allergy   . Anxiety   . Arthritis    "just the norm" (01/24/2013)  . ASCVD (arteriosclerotic cardiovascular disease)   . Asthma   . Daily headache    "depends on the season" (01/24/2013)  . Fibromyalgia   . GERD (gastroesophageal reflux disease)   . History of colonic polyps   . Hyperlipidemia   . Hypertension   . Migraines   . Obesity (BMI 30.0-34.9)   . RSD (reflex sympathetic dystrophy)   . Sleep apnea    NO MACHINE RECOMMENDED  . TIA (transient ischemic attack) ~ 2009   There were no vitals taken for this visit.  Opioid Risk Score:   Fall Risk Score:  `1  Depression screen PHQ 2/9  Depression screen [facility] 2/9 11/26/2015 06/25/2015  Decreased Interest 3 3  Down, Depressed, Hopeless 2 2  PHQ - 2 Score 5 5  Altered sleeping - 3  Tired, decreased energy - 3  Change in appetite - 2  Feeling bad or failure about yourself  - 3  Trouble concentrating - 2  Moving slowly or fidgety/restless - 0  Suicidal thoughts - 1  PHQ-9 Score - 19  Difficult doing work/chores - Somewhat difficult  Some recent data might be hidden   Review of Systems  HENT: Negative.   Eyes: Negative.   Respiratory: Negative.   Cardiovascular: Negative.   Gastrointestinal: Negative.   Endocrine: Negative.   Genitourinary: Positive for difficulty urinating.  Musculoskeletal: Negative.        Spasms  Skin: Negative.   Allergic/Immunologic: Negative.   Neurological: Positive for dizziness, tremors, weakness and numbness.       Tingling   Hematological: Negative.   Psychiatric/Behavioral: Positive for dysphoric mood. The patient is nervous/anxious.   All other systems reviewed and are negative.      Objective:   Physical Exam  Constitutional: She is oriented to person, place, and time. She appears well-developed and well-nourished.  HENT:  Head: Normocephalic and atraumatic.  Eyes: Conjunctivae  and EOM are normal. Pupils are equal, round, and reactive to light.  Neurological: She is alert and oriented to person, place, and time. She has normal reflexes.  Movement and touch. Allodynia in the entire left upper extremity, from the knee down the left lower extremity and from the ankle down in the right lower extremity. No evidence of hair loss or sweating abnormalities in the left upper report bilateral lower. Left hand and both feet are mildly cool to touch, no cyanosis.  Psychiatric: Her speech is normal and behavior is normal. Judgment and thought content normal. Her mood appears anxious. Cognition and memory are normal.  Looks tired but otherwise in no acute distress  Nursing note and vitals reviewed.         Assessment & Plan:  1.RSD, left upper extremity as well as bilateral lower extremities She is maintained independent  functioning, taking care of her kids, driving on the current pain medication regimen. She is no longer on high-dose narcotic analgesics. Continue hydrocodone 7.5 milligrams twice a day Cymbalta 90 mg per day. Ibuprofen 600 mg 3 times a day Lidoderm patch up to 3 daily. Tizanidine 4 mg 4 times a day Topamax 100 mg every morning and 200 mg every afternoon Tramadol 50 g twice a day Tramadol ER 100 mg daily Trazodone 50 daily at bedtime  We discussed potential referral to consider IV ketamine, [facility], patient does not wish to pursue this at the current time.

## 2016-09-27 ENCOUNTER — Other Ambulatory Visit: Payer: Self-pay | Admitting: *Deleted

## 2016-09-27 MED ORDER — TRAZODONE HCL 50 MG PO TABS: ORAL_TABLET | ORAL | 2 refills | Status: DC

## 2016-09-30 LAB — TOXASSURE SELECT,+ANTIDEPR,UR

## 2016-10-01 ENCOUNTER — Other Ambulatory Visit: Payer: Self-pay

## 2016-10-01 MED ORDER — TIZANIDINE HCL 4 MG PO TABS: ORAL_TABLET | ORAL | 4 refills | Status: DC

## 2016-10-01 NOTE — Progress Notes (Signed)
Urine drug screen for this encounter is consistent for prescribed medication 

## 2016-10-19 ENCOUNTER — Telehealth: Payer: Self-pay | Admitting: Internal Medicine

## 2016-10-19 NOTE — Telephone Encounter (Signed)
Patient brought in a jury summons form to be filled out.  Also, she is requesting an excuse note from the PCP for the jury duty.  Form was put in the PCP's prescription incoming box.

## 2016-10-20 NOTE — Telephone Encounter (Signed)
Request placed in Dr Alla German InBox on his desk

## 2016-10-21 ENCOUNTER — Encounter: Payer: Self-pay | Admitting: Internal Medicine

## 2016-10-21 ENCOUNTER — Encounter: Payer: Worker's Compensation | Attending: Physical Medicine & Rehabilitation

## 2016-10-21 ENCOUNTER — Encounter: Payer: Self-pay | Admitting: Physical Medicine & Rehabilitation

## 2016-10-21 ENCOUNTER — Ambulatory Visit (HOSPITAL_BASED_OUTPATIENT_CLINIC_OR_DEPARTMENT_OTHER): Payer: Worker's Compensation | Admitting: Physical Medicine & Rehabilitation

## 2016-10-21 VITALS — BP 119/80 | HR 70 | Resp 16

## 2016-10-21 DIAGNOSIS — G90522 Complex regional pain syndrome I of left lower limb: Secondary | ICD-10-CM | POA: Insufficient documentation

## 2016-10-21 DIAGNOSIS — G90512 Complex regional pain syndrome I of left upper limb: Secondary | ICD-10-CM | POA: Insufficient documentation

## 2016-10-21 DIAGNOSIS — G9050 Complex regional pain syndrome I, unspecified: Secondary | ICD-10-CM | POA: Diagnosis not present

## 2016-10-21 DIAGNOSIS — Z7689 Persons encountering health services in other specified circumstances: Secondary | ICD-10-CM

## 2016-10-21 MED ORDER — HYDROCODONE-ACETAMINOPHEN 7.5-325 MG PO TABS: ORAL_TABLET | ORAL | 0 refills | Status: DC

## 2016-10-21 MED ORDER — TOPIRAMATE 100 MG PO TABS: 100.0000 mg | ORAL_TABLET | Freq: Two times a day (BID) | ORAL | 4 refills | Status: DC

## 2016-10-21 NOTE — Telephone Encounter (Signed)
Pt aware  Note up front

## 2016-10-21 NOTE — Patient Instructions (Signed)
Topiramate, may call us for a few tablet rx if insurance doesn't authorize in time

## 2016-10-21 NOTE — Telephone Encounter (Signed)
Note written $20 charge

## 2016-10-21 NOTE — Progress Notes (Signed)
Subjective:    Patient ID: [name], female    DOB: 1965/11/02, 51 y.o.   MRN: [id number]  HPI Does stretches, relaxation therapy, stationary bike .  Doesn't do as well with treadmill Pain Inventory Average Pain 8 Pain Right Now 8 My pain is constant, sharp, burning, stabbing, tingling and aching  In the last 24 hours, has pain interfered with the following? General activity 2 Relation with others 2 Enjoyment of life 2 What TIME of day is your pain at its worst? all Sleep (in general) Poor  Pain is worse with: walking, bending, sitting, unsure and some activites Pain improves with: rest, heat/ice, therapy/exercise, pacing activities, medication and injections Relief from Meds: 5  Mobility ability to climb steps?  yes do you drive?  yes  Function disabled: date disabled 12/94 I need assistance with the following:  meal prep, household duties and shopping  Neuro/Psych bladder control problems weakness numbness tremor tingling trouble walking spasms dizziness depression anxiety  Prior Studies Any changes since last visit?  no  Physicians involved in your care Any changes since last visit?  no   Family History  Problem Relation Age of Onset  . Cancer Mother     lung  . Hypertension Mother   . Cancer Father     colon  . Hypertension Sister   . Cancer Sister   . Cancer Brother   . Hyperlipidemia Sister   . Birth defects Maternal Grandmother     breast  . Birth defects Paternal Grandmother     uterine, stomach, lung   Social History   Social History  . Marital status: Married    Spouse name: N/A  . Number of children: 2  . Years of education: N/A   Occupational History  . Disabled due to RSD    Social History Main Topics  . Smoking status: Never Smoker  . Smokeless tobacco: Never Used  . Alcohol use 0.0 oz/week     Comment: 01/24/2013 "drink or 2 once or /twice/year, if that"  . Drug use: No  . Sexual activity: Not Currently   Other  Topics Concern  . None   Social History Narrative  . None   Past Surgical History:  Procedure Laterality Date  . APPENDECTOMY  ~ 06/2007  . BLADDER REPAIR  ~ 06/2007   "same day after bladder lift" (01/24/2013)  . BLADDER SUSPENSION  ~ 06/2007  . DIAGNOSTIC LAPAROSCOPY  1990's & ~ 2000   "I've had a couple; for endometrosis" (01/24/2013)  . HERNIA REPAIR    . INCISIONAL HERNIA REPAIR N/A 01/24/2013   Procedure: LAPAROSCOPIC INCISIONAL HERNIA;  Surgeon: Harl Bowie, MD;  Location: [place];  Service: General;  Laterality: N/A;  . INSERTION OF MESH N/A 01/24/2013   Procedure: INSERTION OF MESH;  Surgeon: Harl Bowie, MD;  Location: [place];  Service: General;  Laterality: N/A;  . LAPAROSCOPIC INCISIONAL / UMBILICAL / [place]  01/24/2013   IHR w/mesh/notes  . NASAL SEPTUM SURGERY  1980's?  . OOPHORECTOMY Right 2009  . TONSILLECTOMY  1990's  . VAGINAL HYSTERECTOMY  ` 06/2007   Past Medical History:  Diagnosis Date  . Allergy   . Anxiety   . Arthritis    "just the norm" (01/24/2013)  . ASCVD (arteriosclerotic cardiovascular disease)   . Asthma   . Daily headache    "depends on the season" (01/24/2013)  . Fibromyalgia   . GERD (gastroesophageal reflux disease)   . History of colonic  polyps   . Hyperlipidemia   . Hypertension   . Migraines   . Obesity (BMI 30.0-34.9)   . RSD (reflex sympathetic dystrophy)   . Sleep apnea    NO MACHINE RECOMMENDED  . TIA (transient ischemic attack) ~ 2009   BP 119/80   Pulse 70   Resp 16   SpO2 93%   Opioid Risk Score:   Fall Risk Score:  `1  Depression screen PHQ 2/9  Depression screen [facility] 2/9 10/21/2016 11/26/2015 06/25/2015  Decreased Interest 3 3 3   Down, Depressed, Hopeless 2 2 2   PHQ - 2 Score 5 5 5   Altered sleeping - - 3  Tired, decreased energy - - 3  Change in appetite - - 2  Feeling bad or failure about yourself  - - 3  Trouble concentrating - - 2  Moving slowly or fidgety/restless - - 0  Suicidal thoughts - - 1    PHQ-9 Score - - 19  Difficult doing work/chores - - Somewhat difficult  Some recent data might be hidden    Review of Systems  Constitutional: Negative.   HENT: Negative.   Eyes: Negative.   Respiratory: Negative.   Cardiovascular: Negative.   Gastrointestinal: Negative.   Endocrine: Negative.   Genitourinary: Negative.   Musculoskeletal: Negative.   Skin: Negative.   Neurological: Negative.   Hematological: Negative.   Psychiatric/Behavioral: Negative.   All other systems reviewed and are negative.      Objective:   Physical Exam  Constitutional: She is oriented to person, place, and time. She appears well-developed and well-nourished.  HENT:  Head: Normocephalic and atraumatic.  Eyes: Conjunctivae and EOM are normal. Pupils are equal, round, and reactive to light.  Neck: Normal range of motion.  Neurological: She is alert and oriented to person, place, and time.  Skin: Skin is warm.  Psychiatric: She has a normal mood and affect. Her behavior is normal. Judgment and thought content normal.  Nursing note and vitals reviewed.         Assessment & Plan:  1.RSD, left upper extremity as well as bilateral lower extremities She is maintained independent functioning, taking care of her kids, driving on the current pain medication regimen. She is no longer on high-dose narcotic analgesics. Continue hydrocodone 7.5 milligrams twice a day Cymbalta 90 mg per day. Ibuprofen 600 mg 3 times a day Lidoderm patch up to 3 daily. Tizanidine 4 mg 4 times a day Topamax 100 mg every morning and 200 mg every afternoon Tramadol 50 g twice a day Tramadol ER 100 mg daily Trazodone 50 daily at bedtime  We discussed alternative medications including Nucynta, which could take the place of hydrocodone, tramadol and potentially Topamax. Would recommend dose of Nucynta ER 100 mg twice a day and Nucynta IR 50 mg twice a day. Hydrocodone. Tramadol could be discontinued and dopamine could be  weaned  We also discussed neuropsychology evaluation, Dr. Sima Matas, who will be starting in this office. Nurse practitioner visit in one month

## 2016-10-26 ENCOUNTER — Telehealth: Payer: Self-pay

## 2016-10-26 NOTE — Telephone Encounter (Signed)
Patient had a question about medication Topiramate. Medication recently filled says Take 1 tablet (100 mg total) by mouth 2 (two) times daily. Last office note 10/21/2016 and AVS says 100 mg every morning and 200 mg every afternoon. Patient states towards the end of the month she may run out and wanted to give us an heads up and also wanted clarification. Please advise.

## 2016-10-26 NOTE — Telephone Encounter (Signed)
Correct dose is 192m in am and 2065mQHS

## 2016-10-27 NOTE — Telephone Encounter (Signed)
Contacted pt and informed of correct dosing. Asked for call back if there are any problems

## 2016-10-28 ENCOUNTER — Telehealth: Payer: Self-pay | Admitting: *Deleted

## 2016-10-28 MED ORDER — TOPIRAMATE 100 MG PO TABS: ORAL_TABLET | ORAL | 4 refills | Status: DC

## 2016-10-28 MED ORDER — LIDOCAINE 5 % EX PTCH: MEDICATED_PATCH | CUTANEOUS | 4 refills | Status: DC

## 2016-10-28 NOTE — Telephone Encounter (Signed)
[name] called for refill on lidocaine patches and needs her topamax order corrected. Done. Topamax should have read 100 mg in am and 200mg  q hs instead of 100mg  bid. [name] notified.

## 2016-11-12 ENCOUNTER — Encounter (HOSPITAL_COMMUNITY): Payer: Self-pay | Admitting: Emergency Medicine

## 2016-11-12 ENCOUNTER — Ambulatory Visit (HOSPITAL_COMMUNITY)
Admission: EM | Admit: 2016-11-12 | Discharge: 2016-11-12 | Disposition: A | Discharge status: Home / Self Care | Payer: Medicare Other | Attending: Family Medicine | Admitting: Family Medicine

## 2016-11-12 DIAGNOSIS — M5416 Radiculopathy, lumbar region: Secondary | ICD-10-CM | POA: Diagnosis not present

## 2016-11-12 MED ORDER — KETOROLAC TROMETHAMINE 60 MG/2ML IM SOLN
INTRAMUSCULAR | Status: AC
  Filled 2016-11-12: qty 2

## 2016-11-12 MED ORDER — METHYLPREDNISOLONE ACETATE 80 MG/ML IJ SUSP
80.0000 mg | Freq: Once | INTRAMUSCULAR | Status: AC
  Administered 2016-11-12: 80 mg via INTRAMUSCULAR

## 2016-11-12 MED ORDER — KETOROLAC TROMETHAMINE 60 MG/2ML IM SOLN
60.0000 mg | Freq: Once | INTRAMUSCULAR | Status: AC
  Administered 2016-11-12: 60 mg via INTRAMUSCULAR

## 2016-11-12 MED ORDER — CYCLOBENZAPRINE HCL 10 MG PO TABS: 10.0000 mg | ORAL_TABLET | Freq: Two times a day (BID) | ORAL | 0 refills | Status: DC | PRN

## 2016-11-12 MED ORDER — PREDNISONE 10 MG (21) PO TBPK: 10.0000 mg | ORAL_TABLET | Freq: Every day | ORAL | 0 refills | Status: DC

## 2016-11-12 MED ORDER — METHYLPREDNISOLONE ACETATE 80 MG/ML IJ SUSP
INTRAMUSCULAR | Status: AC
  Filled 2016-11-12: qty 1

## 2016-11-12 NOTE — ED Provider Notes (Signed)
CSN: [id number]     Arrival date & time 11/12/16  1247 History   First MD Initiated Contact with Patient 11/12/16 1353     Chief Complaint  Patient presents with  . Back Pain    lower  . Abdominal Pain    lower with BM   (Consider location/radiation/quality/duration/timing/severity/associated sxs/prior Treatment) 51 year old female presents with chief complaint of lower back pain. She reports she was vacuuming her carpet when she felt a "pop" in her lower back. Since then it has been painful to move, walk, or bend over. She has had no loss of sensation in her lower extermities, no loss of control with bowel or bladder. She does describe tingling down her legs.   The history is provided by the patient.    Past Medical History:  Diagnosis Date  . Allergy   . Anxiety   . Arthritis    "just the norm" (01/24/2013)  . ASCVD (arteriosclerotic cardiovascular disease)   . Asthma   . Daily headache    "depends on the season" (01/24/2013)  . Fibromyalgia   . GERD (gastroesophageal reflux disease)   . History of colonic polyps   . Hyperlipidemia   . Hypertension   . Migraines   . Obesity (BMI 30.0-34.9)   . RSD (reflex sympathetic dystrophy)   . Sleep apnea    NO MACHINE RECOMMENDED  . TIA (transient ischemic attack) ~ 2009   Past Surgical History:  Procedure Laterality Date  . APPENDECTOMY  ~ 06/2007  . BLADDER REPAIR  ~ 06/2007   "same day after bladder lift" (01/24/2013)  . BLADDER SUSPENSION  ~ 06/2007  . DIAGNOSTIC LAPAROSCOPY  1990's & ~ 2000   "I've had a couple; for endometrosis" (01/24/2013)  . HERNIA REPAIR    . INCISIONAL HERNIA REPAIR N/A 01/24/2013   Procedure: LAPAROSCOPIC INCISIONAL HERNIA;  Surgeon: Shelly Rubensteinouglas A Blackman, MD;  [facility];  Service: General;  Laterality: N/A;  . INSERTION OF MESH N/A 01/24/2013   Procedure: INSERTION OF MESH;  Surgeon: Shelly Rubensteinouglas A Blackman, MD;  Location: [facility];  Service: General;  Laterality: N/A;  . LAPAROSCOPIC INCISIONAL / UMBILICAL /  [place] HERNIA REPAIR  01/24/2013   IHR w/mesh/notes  . NASAL SEPTUM SURGERY  1980's?  . OOPHORECTOMY Right 2009  . TONSILLECTOMY  1990's  . VAGINAL HYSTERECTOMY  ` 06/2007   Family History  Problem Relation Age of Onset  . Cancer Mother     lung  . Hypertension Mother   . Cancer Father     colon  . Hypertension Sister   . Cancer Sister   . Cancer Brother   . Hyperlipidemia Sister   . Birth defects Maternal Grandmother     breast  . Birth defects Paternal Grandmother     uterine, stomach, lung   Social History  Substance Use Topics  . Smoking status: Never Smoker  . Smokeless tobacco: Never Used  . Alcohol use 0.0 oz/week     Comment: 01/24/2013 "drink or 2 once or /twice/year, if that"   OB History    No data available     Review of Systems  Reason unable to perform ROS: as covered in HPI.  All other systems reviewed and are negative.   Allergies  Demerol [meperidine]; Cymbalta [duloxetine hcl]; Diazepam; Meperidine hcl; and Other  Home Medications   Prior to Admission medications   Medication Sig Start Date End Date Taking? Authorizing Provider  albuterol (PROAIR HFA) 108 (90 Base) MCG/ACT inhaler Inhale  2 puffs into the lungs every 4 (four) hours as needed. 04/26/16  Yes Karie Schwalbe, MD  calcium carbonate (OS-CAL) 600 MG TABS tablet Take 1 tablet (600 mg total) by mouth 2 (two) times daily with a meal. 05/06/14  Yes Lesly Dukes, MD  clobetasol cream (TEMOVATE) 0.05 % Apply 1 application topically 2 (two) times daily. 04/25/15  Yes Amy E Bedsole, MD  CYMBALTA 30 MG capsule TAKE 3 CAPSULES BY MOUTH DAILY. 06/04/16  Yes Erick Colace, MD  esomeprazole (NEXIUM) 40 MG capsule TAKE 1 CAPSULE (40 MG TOTAL) BY MOUTH 2 (TWO) TIMES DAILY. 04/26/16  Yes Karie Schwalbe, MD  fluticasone (FLONASE) 50 MCG/ACT nasal spray Place 2 sprays into both nostrils daily. 04/03/14  Yes Lorre Munroe, NP  HYDROcodone-acetaminophen (NORCO) 7.5-325 MG tablet TAKE 1 TABLET Twice a Day.  One in the Morning and one at Bedtime 10/21/16  Yes Erick Colace, MD  ibuprofen (ADVIL,MOTRIN) 600 MG tablet Take 1 tablet (600 mg total) by mouth 3 (three) times daily as needed. 05/26/16  Yes Erick Colace, MD  lidocaine (LIDODERM) 5 % MAY USE UP TO 3 PATCHES DAILY. REMOVE AND DISCARED PATCHES AFTER 12 HOURS OR AS DIRECTED 10/28/16  Yes Jones Bales, NP  loratadine (CLARITIN) 10 MG tablet Take 10 mg by mouth daily.     Yes Historical Provider, MD  losartan-hydrochlorothiazide (HYZAAR) 100-25 MG tablet TAKE 1 TABLET BY MOUTH DAILY. 05/11/16  Yes Karie Schwalbe, MD  losartan-hydrochlorothiazide (HYZAAR) 100-25 MG tablet TAKE 1 TABLET BY MOUTH DAILY. 07/19/16  Yes Karie Schwalbe, MD  tiZANidine (ZANAFLEX) 4 MG tablet TAKE 1 TABLET (4 MG TOTAL) BY MOUTH 4 (FOUR) TIMES DAILY. 10/01/16  Yes Erick Colace, MD  topiramate (TOPAMAX) 100 MG tablet Take 100 mg q am and 200 mg q pm. 10/28/16  Yes Erick Colace, MD  traMADol (ULTRAM) 50 MG tablet TAKE 1 TABLET BY MOUTH TWICE A DAY. DIRECTION CHANGE 08/19/16  Yes Jones Bales, NP  traMADol (ULTRAM-ER) 100 MG 24 hr tablet Take 1 tablet (100 mg total) by mouth daily. 08/19/16  Yes Jones Bales, NP  traZODone (DESYREL) 50 MG tablet TAKE 1 TABLET (50 MG TOTAL) BY MOUTH AT BEDTIME. 09/27/16  Yes Erick Colace, MD  vitamin E (VITAMIN E) 400 UNIT capsule Take 1 capsule (400 Units total) by mouth daily. 05/06/14  Yes Lesly Dukes, MD  cyclobenzaprine (FLEXERIL) 10 MG tablet Take 1 tablet (10 mg total) by mouth 2 (two) times daily as needed for muscle spasms. 11/12/16   Dorena Bodo, NP  predniSONE (STERAPRED UNI-PAK 21 TAB) 10 MG (21) TBPK tablet Take 1 tablet (10 mg total) by mouth daily. Take 6 tabs by mouth daily  for 2 days, then 5 tabs for 2 days, then 4 tabs for 2 days, then 3 tabs for 2 days, 2 tabs for 2 days, then 1 tab by mouth daily for 2 days 11/12/16   Dorena Bodo, NP   Meds Ordered and Administered this Visit    Medications  ketorolac (TORADOL) injection 60 mg (not administered)  methylPREDNISolone acetate (DEPO-MEDROL) injection 80 mg (not administered)    BP 103/73 (BP Location: Right Arm)   Pulse 85   Temp 98 F (36.7 C) (Oral)   SpO2 97%  No data found.   Physical Exam  Constitutional: She is oriented to person, place, and time. She appears well-developed and well-nourished. No distress.  Cardiovascular: Normal rate and regular rhythm.  Pulmonary/Chest: Effort normal and breath sounds normal.  Abdominal: Soft. Bowel sounds are normal.  Musculoskeletal:       Lumbar back: She exhibits decreased range of motion, tenderness, pain and spasm. She exhibits no bony tenderness, no swelling, no edema, no deformity, no laceration and normal pulse.       Back:  Positive left straight leg raise  Neurological: She is alert and oriented to person, place, and time.  Skin: Skin is warm and dry. Capillary refill takes less than 2 seconds. She is not diaphoretic.  Psychiatric: She has a normal mood and affect.  Nursing note and vitals reviewed.   Urgent Care Course     Procedures (including critical care time)  Labs Review Labs Reviewed - No data to display  Imaging Review No results found.   Visual Acuity Review  Right Eye Distance:   Left Eye Distance:   Bilateral Distance:    Right Eye Near:   Left Eye Near:    Bilateral Near:         MDM   1. Lumbar radiculopathy   You have a lower lumbar radiculopathy. You have been treated in clinic with an injection of toradol and a prednisone injection. I have prescribed an extended steroid taper, take 6 tablets tomorrow, then decrease by 1 each day till finished (6,6,5,5,4,4,3,3,2,2,1,1) I have also prescribed a muscle relaxant. Take 1 tablet twice a day of Flexeril. Should your symptoms fail to improve or worsen follow up with your primary care provider or return to clinic.      Dorena Bodo, NP 11/12/16 1421

## 2016-11-12 NOTE — ED Triage Notes (Signed)
Pt has been suffering from mid lower back pain since Saturday.  She states it radiates to the right and left lower back.  She reports that it radiated to her lower abdomen when she tried to have a bowel movement on two separate occasions.  She states the pain started when she was vacuuming and she felt something "pop" in her back and then the pain was worse when she bent over.  She denies any loss of bladder or bowel control, no urinary issues, and no fever.  She reports being constipated all the time. She states she only has a bowel movement maybe once every 10 days.

## 2016-11-12 NOTE — ED Notes (Signed)
Pt    Had  [facility]  About  rx  For  Pain  meds  Checked   With the   Extender     Flexeril  And  Prednisone  rx     As   Written  No  Other scripts   Pt  Advised

## 2016-11-12 NOTE — Discharge Instructions (Signed)
You have a lower lumbar radiculopathy. You have been treated in clinic with an injection of toradol and a prednisone injection. I have prescribed an extended steroid taper, take 6 tablets tomorrow, then decrease by 1 each day till finished (6,6,5,5,4,4,3,3,2,2,1,1) I have also prescribed a muscle relaxant. Take 1 tablet twice a day of Flexeril. Should your symptoms fail to improve or worsen follow up with your primary care provider or return to clinic.

## 2016-11-15 ENCOUNTER — Telehealth: Payer: Self-pay

## 2016-11-15 ENCOUNTER — Telehealth: Payer: Self-pay | Admitting: Physical Medicine & Rehabilitation

## 2016-11-15 NOTE — Telephone Encounter (Signed)
She said she is not feeling very good. She has no insurance. She was given prednisone and flexeril. She will call for OV if no better in a few days

## 2016-11-15 NOTE — Telephone Encounter (Signed)
Will need more information as to what is the purpose of this deposition before I can schedule this

## 2016-11-15 NOTE — Telephone Encounter (Signed)
[facility] - Atty  Rep. Cindy [phone]- needs to schedule 45 min phone deposition -

## 2016-11-25 ENCOUNTER — Telehealth: Payer: Self-pay | Admitting: Registered Nurse

## 2016-11-25 ENCOUNTER — Encounter: Payer: Worker's Compensation | Attending: Physical Medicine & Rehabilitation | Admitting: Registered Nurse

## 2016-11-25 ENCOUNTER — Encounter: Payer: Self-pay | Admitting: Registered Nurse

## 2016-11-25 VITALS — BP 123/80 | HR 88

## 2016-11-25 DIAGNOSIS — G90522 Complex regional pain syndrome I of left lower limb: Secondary | ICD-10-CM | POA: Diagnosis present

## 2016-11-25 DIAGNOSIS — G90512 Complex regional pain syndrome I of left upper limb: Secondary | ICD-10-CM | POA: Diagnosis not present

## 2016-11-25 DIAGNOSIS — Z79899 Other long term (current) drug therapy: Secondary | ICD-10-CM

## 2016-11-25 DIAGNOSIS — F411 Generalized anxiety disorder: Secondary | ICD-10-CM | POA: Diagnosis not present

## 2016-11-25 DIAGNOSIS — M62838 Other muscle spasm: Secondary | ICD-10-CM

## 2016-11-25 DIAGNOSIS — G4700 Insomnia, unspecified: Secondary | ICD-10-CM

## 2016-11-25 DIAGNOSIS — G894 Chronic pain syndrome: Secondary | ICD-10-CM

## 2016-11-25 DIAGNOSIS — G9050 Complex regional pain syndrome I, unspecified: Secondary | ICD-10-CM

## 2016-11-25 DIAGNOSIS — F3289 Other specified depressive episodes: Secondary | ICD-10-CM

## 2016-11-25 DIAGNOSIS — Z5181 Encounter for therapeutic drug level monitoring: Secondary | ICD-10-CM | POA: Diagnosis not present

## 2016-11-25 MED ORDER — TRAMADOL HCL ER 100 MG PO TB24: 100.0000 mg | ORAL_TABLET | Freq: Every day | ORAL | 3 refills | Status: DC

## 2016-11-25 MED ORDER — HYDROCODONE-ACETAMINOPHEN 7.5-325 MG PO TABS: ORAL_TABLET | ORAL | 0 refills | Status: DC

## 2016-11-25 MED ORDER — TRAMADOL HCL 50 MG PO TABS: ORAL_TABLET | ORAL | 4 refills | Status: DC

## 2016-11-25 NOTE — Telephone Encounter (Signed)
On 11/25/2016 the [place]CC[place] was reviewed no conflict was seen [place] the [facility] with multiple prescribers. [name] has a signed narcotic contract with our office. If there were any discrepancies this would have been reported to her physician.

## 2016-11-25 NOTE — Progress Notes (Signed)
Subjective:    Patient ID: [name], female    DOB: 1966-01-17, 51 y.o.   MRN: [id number]  HPI: [name] is a 51 year old female who returns for follow up appointment for chronic pain and medication refill. She states her pain is located in her left arm. She rates her pain 8. Her current exercise regime is walking short distances in her home.  [name] states she fell about 3 weeks ago in her home, she was bending over and lost her balance and landed on her left side. Her husband helped her up. She didn't seek medical attention. States a week later she went to [facility] for back pain she was prescribed steroid.   Pain Inventory Average Pain 8 Pain Right Now 8 My pain is constant, sharp, burning, stabbing, tingling and aching  In the last 24 hours, has pain interfered with the following? General activity 8 Relation with others 5 Enjoyment of life 7 What TIME of day is your pain at its worst? all Sleep (in general) Poor  Pain is worse with: walking, bending, sitting, inactivity, standing, unsure and some activites Pain improves with: rest, heat/ice, therapy/exercise, pacing activities, medication and TENS Relief from Meds: 6  Mobility walk without assistance ability to climb steps?  yes do you drive?  yes  Function disabled: date disabled . I need assistance with the following:  meal prep, household duties and shopping  Neuro/Psych   Prior Studies  Physicians involved in your care    Family History  Problem Relation Age of Onset  . Cancer Mother     lung  . Hypertension Mother   . Cancer Father     colon  . Hypertension Sister   . Cancer Sister   . Cancer Brother   . Hyperlipidemia Sister   . Birth defects Maternal Grandmother     breast  . Birth defects Paternal Grandmother     uterine, stomach, lung   Social History   Social History  . Marital status: Married    Spouse name: N/A  . Number of children: 2  . Years  of education: N/A   Occupational History  . Disabled due to RSD    Social History Main Topics  . Smoking status: Never Smoker  . Smokeless tobacco: Never Used  . Alcohol use 0.0 oz/week     Comment: 01/24/2013 "drink or 2 once or /twice/year, if that"  . Drug use: No  . Sexual activity: Not Currently   Other Topics Concern  . None   Social History Narrative  . None   Past Surgical History:  Procedure Laterality Date  . APPENDECTOMY  ~ 06/2007  . BLADDER REPAIR  ~ 06/2007   "same day after bladder lift" (01/24/2013)  . BLADDER SUSPENSION  ~ 06/2007  . DIAGNOSTIC LAPAROSCOPY  1990's & ~ 2000   "I've had a couple; for endometrosis" (01/24/2013)  . HERNIA REPAIR    . INCISIONAL HERNIA REPAIR N/A 01/24/2013   Procedure: LAPAROSCOPIC INCISIONAL HERNIA;  Surgeon: Harl Bowie, MD;  Location: [place];  Service: General;  Laterality: N/A;  . INSERTION OF MESH N/A 01/24/2013   Procedure: INSERTION OF MESH;  Surgeon: Harl Bowie, MD;  Location: [place];  Service: General;  Laterality: N/A;  . LAPAROSCOPIC INCISIONAL / UMBILICAL / [place]  01/24/2013   IHR w/mesh/notes  . NASAL SEPTUM SURGERY  1980's?  . OOPHORECTOMY Right 2009  . TONSILLECTOMY  1990's  .  VAGINAL HYSTERECTOMY  ` 06/2007   Past Medical History:  Diagnosis Date  . Allergy   . Anxiety   . Arthritis    "just the norm" (01/24/2013)  . ASCVD (arteriosclerotic cardiovascular disease)   . Asthma   . Daily headache    "depends on the season" (01/24/2013)  . Fibromyalgia   . GERD (gastroesophageal reflux disease)   . History of colonic polyps   . Hyperlipidemia   . Hypertension   . Migraines   . Obesity (BMI 30.0-34.9)   . RSD (reflex sympathetic dystrophy)   . Sleep apnea    NO MACHINE RECOMMENDED  . TIA (transient ischemic attack) ~ 2009   There were no vitals taken for this visit.  Opioid Risk Score:   Fall Risk Score:  `1  Depression screen PHQ 2/9  Depression screen [facility] 2/9 10/21/2016 11/26/2015  06/25/2015  Decreased Interest 3 3 3   Down, Depressed, Hopeless 2 2 2   PHQ - 2 Score 5 5 5   Altered sleeping - - 3  Tired, decreased energy - - 3  Change in appetite - - 2  Feeling bad or failure about yourself  - - 3  Trouble concentrating - - 2  Moving slowly or fidgety/restless - - 0  Suicidal thoughts - - 1  PHQ-9 Score - - 19  Difficult doing work/chores - - Somewhat difficult  Some recent data might be hidden    Review of Systems  HENT: Negative.   Eyes: Negative.   Respiratory: Negative.   Cardiovascular: Negative.   Gastrointestinal: Negative.   Endocrine: Negative.   Genitourinary: Negative.   Musculoskeletal: Positive for back pain and gait problem.  Skin: Negative.   Allergic/Immunologic: Negative.   Neurological: Positive for dizziness, tremors, weakness and numbness.       Tingling  Hematological: Negative.   Psychiatric/Behavioral: The patient is nervous/anxious.   All other systems reviewed and are negative.      Objective:   Physical Exam  Constitutional: She is oriented to person, place, and time. She appears well-developed and well-nourished.  HENT:  Head: Normocephalic and atraumatic.  Neck: Normal range of motion. Neck supple.  Cardiovascular: Normal rate and regular rhythm.   Pulmonary/Chest: Effort normal and breath sounds normal.  Musculoskeletal:  Normal Muscle Bulk and Muscle Testing Reveals: Upper Extremities: Right: Full ROM and Muscle Strength 5/5  Left: Decreased ROM 90 Degrees and Muscle Strength 3/5 Bilateral AC Joint Tenderness Thoracic Paraspinal Tenderness: T-1-T-3  T-7-T-9 Lumbar Hypersensitivity Lower Extremities: Full ROM and Muscle Strength 4/5 Right Lower Extremity Flexion Produces Pain from her foot to her shin Left Lower Extremity Flexion Produces Pain from her foot to her knee Arises from table slowly Antalgic Gait  Neurological: She is alert and oriented to person, place, and time.  Skin: Skin is warm and dry.    Psychiatric: She has a normal mood and affect.  Nursing note and vitals reviewed.         Assessment & Plan:  1. Complex Regional Pain Syndrome Type 1: Continue Topamax and Cymbalta02/05/2017 Refilled:Hydro-codone 7.5/325mg one tablet twice a day one in the morning and one at bedtime#60. Continue:Tramadol 100 mg ER daily #30 and Tramadol 50 mg one tablet by mouth BID. One tablet in the afternoon and one tablet in the evening. We will continue the opioid monitoring program, this consists of regular clinic visits, examinations, urine drug screen, pill counts as well as use of [place] Controlled Substance Reporting System. 2. Depression: Continue Cymbalta. Referral Placed  for Counseling 3. Insomnia: ContinueTrazodone50 mg HS. 11/25/2016 4. Muscle Spasms: Continue Tizanidine.11/25/2016  20 minutes of face to face patient care time was spent during this visit. All questions were encouraged and answered.  F/U in 1 month

## 2016-11-26 ENCOUNTER — Telehealth: Payer: Self-pay | Admitting: Physical Medicine & Rehabilitation

## 2016-11-26 NOTE — Telephone Encounter (Signed)
patient would like a letter written on behalf of her children.  They are car riders and cannot come out the car to pick up - they must wait until the parrent walks up to get the child or waits in the long line of cars until they can get to the front of the line.  It is hard ont he paritne to walk form the parking lot to the school to get the children and walk back - and it hurts to sit idle for so long in the car. Can a letter be written on behalf of the children  [name] and [name]) stating that due to their moms medical condition they can walk to the car without her assistance  Please call patient with any questions.

## 2016-11-26 NOTE — Telephone Encounter (Signed)
I don't think she needs this accommodation

## 2016-11-26 NOTE — Telephone Encounter (Signed)
Will forward to the provider.

## 2016-11-30 ENCOUNTER — Ambulatory Visit (INDEPENDENT_AMBULATORY_CARE_PROVIDER_SITE_OTHER): Payer: Medicare Other | Admitting: Physician Assistant

## 2016-11-30 ENCOUNTER — Encounter: Payer: Self-pay | Admitting: Physician Assistant

## 2016-11-30 VITALS — BP 110/80 | HR 73 | Temp 98.9°F | Ht 67.5 in | Wt 170.0 lb

## 2016-11-30 DIAGNOSIS — J111 Influenza due to unidentified influenza virus with other respiratory manifestations: Secondary | ICD-10-CM

## 2016-11-30 DIAGNOSIS — J4520 Mild intermittent asthma, uncomplicated: Secondary | ICD-10-CM

## 2016-11-30 MED ORDER — AMOXICILLIN-POT CLAVULANATE 875-125 MG PO TABS: 1.0000 | ORAL_TABLET | Freq: Two times a day (BID) | ORAL | 0 refills | Status: DC

## 2016-11-30 MED ORDER — HYDROCOD POLST-CPM POLST ER 10-8 MG/5ML PO SUER: 5.0000 mL | Freq: Every evening | ORAL | 0 refills | Status: DC | PRN

## 2016-11-30 MED ORDER — OSELTAMIVIR PHOSPHATE 75 MG PO CAPS: 75.0000 mg | ORAL_CAPSULE | Freq: Two times a day (BID) | ORAL | 0 refills | Status: DC

## 2016-11-30 NOTE — Progress Notes (Signed)
Pre visit review using our clinic review tool, if applicable. No additional management support is needed unless otherwise documented below in the visit note. 

## 2016-11-30 NOTE — Progress Notes (Addendum)
Subjective:    Patient ID: [name], female    DOB: [birth year], 51 y.o.   MRN: [id number]  HPI  [name] is a 51 y/o female who presents with non-productive cough, sore throat, headache, chest congestion. Cough is not productive. Fever last night was 101.4, she states that she has had intermittent fevers since Sunday. Her son was diagnosed with the flu on Sunday and has been on Tamiflu. She is eating and drinking fair. She has used her Proair inhaler a few times since Sunday, denies SOB or wheezing. Denies chest pain, n/v/d. She has used Coricidin without relief.  Review of Systems  See HPI  Past Medical History:  Diagnosis Date  . Allergy   . Anxiety   . Arthritis    "just the norm" (01/24/2013)  . ASCVD (arteriosclerotic cardiovascular disease)   . Asthma   . Daily headache    "depends on the season" (01/24/2013)  . Fibromyalgia   . GERD (gastroesophageal reflux disease)   . History of colonic polyps   . Hyperlipidemia   . Hypertension   . Migraines   . Obesity (BMI 30.0-34.9)   . RSD (reflex sympathetic dystrophy)   . Sleep apnea    NO MACHINE RECOMMENDED  . TIA (transient ischemic attack) ~ 2009     Social History   Social History  . Marital status: Married    Spouse name: N/A  . Number of children: 2  . Years of education: N/A   Occupational History  . Disabled due to RSD    Social History Main Topics  . Smoking status: Never Smoker  . Smokeless tobacco: Never Used  . Alcohol use 0.0 oz/week     Comment: 01/24/2013 "drink or 2 once or /twice/year, if that"  . Drug use: No  . Sexual activity: Not Currently   Other Topics Concern  . Not on file   Social History Narrative  . No narrative on file    Past Surgical History:  Procedure Laterality Date  . APPENDECTOMY  ~ 06/2007  . BLADDER REPAIR  ~ 06/2007   "same day after bladder lift" (01/24/2013)  . BLADDER SUSPENSION  ~ 06/2007  . DIAGNOSTIC LAPAROSCOPY  1990's & ~ 2000   "I've had a couple;  for endometrosis" (01/24/2013)  . HERNIA REPAIR    . INCISIONAL HERNIA REPAIR N/A 01/24/2013   Procedure: LAPAROSCOPIC INCISIONAL HERNIA;  Surgeon: Shelly Rubenstein, MD;  Location[facility];  Service: General;  Laterality: N/A;  . INSERTION OF MESH N/A 01/24/2013   Procedure: INSERTION OF MESH;  Surgeon: Shelly Rubenstein, MD;  Location: [facility] OR;  Service: General;  Laterality: N/A;  . LAPAROSCOPIC INCISIONAL / UMBILICAL / [place]ENTRAL HERNIA REPAIR  01/24/2013   IHR w/mesh/notes  . NASAL SEPTUM SURGERY  1980's?  . OOPHORECTOMY Right 2009  . TONSILLECTOMY  1990's  . VAGINAL HYSTERECTOMY  ` 06/2007    Family History  Problem Relation Age of Onset  . Cancer Mother     lung  . Hypertension Mother   . Cancer Father     colon  . Hypertension Sister   . Cancer Sister   . Cancer Brother   . Hyperlipidemia Sister   . Birth defects Maternal Grandmother     breast  . Birth defects Paternal Grandmother     uterine, stomach, lung    Allergies  Allergen Reactions  . Demerol [Meperidine] Anaphylaxis, Itching and Swelling  . Cymbalta [Duloxetine Hcl] Hives    Generic makes  her feel like she is crawling out of her skin  . Diazepam     REACTION: swelling  . Meperidine Hcl     REACTION: swelling  . Other     Axe Cologne    Current Outpatient Prescriptions on File Prior to Visit  Medication Sig Dispense Refill  . albuterol (PROAIR HFA) 108 (90 Base) MCG/ACT inhaler Inhale 2 puffs into the lungs every 4 (four) hours as needed. 1 Inhaler 4  . calcium carbonate (OS-CAL) 600 MG TABS tablet Take 1 tablet (600 mg total) by mouth 2 (two) times daily with a meal. 60 tablet 12  . clobetasol cream (TEMOVATE) 0.05 % Apply 1 application topically 2 (two) times daily. 30 g 0  . CYMBALTA 30 MG capsule TAKE 3 CAPSULES BY MOUTH DAILY. 270 capsule 2  . esomeprazole (NEXIUM) 40 MG capsule TAKE 1 CAPSULE (40 MG TOTAL) BY MOUTH 2 (TWO) TIMES DAILY. 60 capsule 11  . fluticasone (FLONASE) 50 MCG/ACT nasal spray Place 2  sprays into both nostrils daily. 16 g 6  . HYDROcodone-acetaminophen (NORCO) 7.5-325 MG tablet TAKE 1 TABLET Twice a Day. One in the Morning and one at Bedtime 60 tablet 0  . ibuprofen (ADVIL,MOTRIN) 600 MG tablet Take 1 tablet (600 mg total) by mouth 3 (three) times daily as needed. 90 tablet 8  . lidocaine (LIDODERM) 5 % MAY USE UP TO 3 PATCHES DAILY. REMOVE AND DISCARED PATCHES AFTER 12 HOURS OR AS DIRECTED 90 patch 4  . loratadine (CLARITIN) 10 MG tablet Take 10 mg by mouth daily.      Marland Kitchen losartan-hydrochlorothiazide (HYZAAR) 100-25 MG tablet TAKE 1 TABLET BY MOUTH DAILY. 30 tablet 11  . tiZANidine (ZANAFLEX) 4 MG tablet TAKE 1 TABLET (4 MG TOTAL) BY MOUTH 4 (FOUR) TIMES DAILY. 120 tablet 4  . topiramate (TOPAMAX) 100 MG tablet Take 100 mg q am and 200 mg q pm. 90 tablet 4  . traMADol (ULTRAM) 50 MG tablet TAKE 1 TABLET BY MOUTH TWICE A DAY. DIRECTION CHANGE 60 tablet 4  . traMADol (ULTRAM-ER) 100 MG 24 hr tablet Take 1 tablet (100 mg total) by mouth daily. 30 tablet 3  . traZODone (DESYREL) 50 MG tablet TAKE 1 TABLET (50 MG TOTAL) BY MOUTH AT BEDTIME. 30 tablet 2  . vitamin E (VITAMIN E) 400 UNIT capsule Take 1 capsule (400 Units total) by mouth daily. 60 capsule 11   No current facility-administered medications on file prior to visit.     BP 110/80 (BP Location: Right Arm, Patient Position: Sitting, Cuff Size: Normal)   Pulse 73   Temp 98.9 F (37.2 C) (Oral)   Ht 5' 7.5" (1.715 m)   Wt 170 lb (77.1 kg)   SpO2 96%   BMI 26.23 kg/m       Objective:   Physical Exam  Constitutional: She appears well-developed and well-nourished. She is cooperative.  Non-toxic appearance. She does not have a sickly appearance. She does not appear ill. No distress.  HENT:  Head: Normocephalic and atraumatic.  Right Ear: Tympanic membrane, external ear and ear canal normal. Tympanic membrane is not erythematous, not retracted and not bulging.  Left Ear: Tympanic membrane, external ear and ear canal  normal. Tympanic membrane is not erythematous, not retracted and not bulging.  Nose: Right sinus exhibits maxillary sinus tenderness. Right sinus exhibits no frontal sinus tenderness. Left sinus exhibits maxillary sinus tenderness. Left sinus exhibits no frontal sinus tenderness.  Mouth/Throat: Uvula is midline. Posterior oropharyngeal erythema present. No posterior  oropharyngeal edema.  Cardiovascular: Normal rate, regular rhythm, normal heart sounds and normal pulses.   Pulmonary/Chest: Effort normal. No accessory muscle usage. No respiratory distress. She has no decreased breath sounds. She has no wheezes. She has rhonchi. She has no rales.  Occasional ronchi, no crackles or wheezes; dry cough throughout exam  Lymphadenopathy:    She has cervical adenopathy.  Neurological: She is alert.  Skin: Skin is warm, dry and intact.  Nursing note and vitals reviewed.      Assessment & Plan:  1. Influenza High suspicion for the flu, will treat with Tamiflu per orders. After discussion, will provide 1 time prescription of Tussionex for her dry, hacking cough, as she has failed Coricidin and cannot tolerate many cough syrups due to her HTN. I told her that I will inform her pain medicine doctor, Claudette Lawsndrew Kirsteins of this, as she is on a pain contract with him. I reviewed this situation with supervising physician Dr. Helane RimaErica Wallace. I advised patient to not use this medicine at the same time as her other narcotics. I have also given her a safety net prescription of Augmentin with recommendations to take if symptoms do not improve, however I strongly encouraged patient to go to the ER if she develops any sudden onset shortness of breath or general lack of improvement despite symptoms. She states that she has a nebulizer machine at home with solution if needed. Patient was agreeable to plan.  Jarold MottoSamantha Clayten Allcock PA-C 11/30/16

## 2016-11-30 NOTE — Patient Instructions (Signed)
It was great meeting you today!  Please take the Tamiflu as prescribed. Use the cough medicine as needed for cough, do not take with any other narcotics to prevent oversedation, only take amount as prescribed.  If you feel worse or have continued fevers, begin the antibiotic and take as prescribed. I want you to go to the Emergency Room if you develop sudden onset shortness of breath or any uncontrolled wheezing.   Influenza, Adult Influenza, more commonly known as "the flu," is a viral infection that primarily affects the respiratory tract. The respiratory tract includes organs that help you breathe, such as the lungs, nose, and throat. The flu causes many common cold symptoms, as well as a high fever and body aches. The flu spreads easily from person to person (is contagious). Getting a flu shot (influenza vaccination) every year is the best way to prevent influenza. What are the causes? Influenza is caused by a virus. You can catch the virus by:  Breathing in droplets from an infected person's cough or sneeze.  Touching something that was recently contaminated with the virus and then touching your mouth, nose, or eyes. What increases the risk? The following factors may make you more likely to get the flu:  Not cleaning your hands frequently with soap and water or alcohol-based hand sanitizer.  Having close contact with many people during cold and flu season.  Touching your mouth, eyes, or nose without washing or sanitizing your hands first.  Not drinking enough fluids or not eating a healthy diet.  Not getting enough sleep or exercise.  Being under a high amount of stress.  Not getting a yearly (annual) flu shot. You may be at a higher risk of complications from the flu, such as a severe lung infection (pneumonia), if you:  Are over the age of 53.  Are pregnant.  Have a weakened disease-fighting system (immune system). You may have a weakened immune system if you:  Have HIV  or AIDS.  Are undergoing chemotherapy.  Aretaking medicines that reduce the activity of (suppress) the immune system.  Have a long-term (chronic) illness, such as heart disease, kidney disease, diabetes, or lung disease.  Have a liver disorder.  Are obese.  Have anemia. What are the signs or symptoms? Symptoms of this condition typically last 4-10 days and may include:  Fever.  Chills.  Headache, body aches, or muscle aches.  Sore throat.  Cough.  Runny or congested nose.  Chest discomfort and cough.  Poor appetite.  Weakness or tiredness (fatigue).  Dizziness.  Nausea or vomiting. How is this diagnosed? This condition may be diagnosed based on your medical history and a physical exam. Your health care provider may do a nose or throat swab test to confirm the diagnosis. How is this treated? If influenza is detected early, you can be treated with antiviral medicine that can reduce the length of your illness and the severity of your symptoms. This medicine may be given by mouth (orally) or through an IV tube that is inserted in one of your veins. The goal of treatment is to relieve symptoms by taking care of yourself at home. This may include taking over-the-counter medicines, drinking plenty of fluids, and adding humidity to the air in your home. In some cases, influenza goes away on its own. Severe influenza or complications from influenza may be treated in a hospital. Follow these instructions at home:  Take over-the-counter and prescription medicines only as told by your health care provider.  Use a cool mist humidifier to add humidity to the air in your home. This can make breathing easier.  Rest as needed.  Drink enough fluid to keep your urine clear or pale yellow.  Cover your mouth and nose when you cough or sneeze.  Wash your hands with soap and water often, especially after you cough or sneeze. If soap and water are not available, use hand  sanitizer.  Stay home from work or school as told by your health care provider. Unless you are visiting your health care provider, try to avoid leaving home until your fever has been gone for 24 hours without the use of medicine.  Keep all follow-up visits as told by your health care provider. This is important. How is this prevented?  Getting an annual flu shot is the best way to avoid getting the flu. You may get the flu shot in late summer, fall, or winter. Ask your health care provider when you should get your flu shot.  Wash your hands often or use hand sanitizer often.  Avoid contact with people who are sick during cold and flu season.  Eat a healthy diet, drink plenty of fluids, get enough sleep, and exercise regularly. Contact a health care provider if:  You develop new symptoms.  You have:  Chest pain.  Diarrhea.  A fever.  Your cough gets worse.  You produce more mucus.  You feel nauseous or you vomit. Get help right away if:  You develop shortness of breath or difficulty breathing.  Your skin or nails turn a bluish color.  You have severe pain or stiffness in your neck.  You develop a sudden headache or sudden pain in your face or ear.  You cannot stop vomiting. This information is not intended to replace advice given to you by your health care provider. Make sure you discuss any questions you have with your health care provider. Document Released: 10/01/2000 Document Revised: 03/11/2016 Document Reviewed: 07/29/2015 Elsevier Interactive Patient Education  2017 [employer].

## 2016-12-01 NOTE — Telephone Encounter (Signed)
Informed Mrs [name].

## 2016-12-15 ENCOUNTER — Telehealth: Payer: Self-pay | Admitting: Physical Medicine & Rehabilitation

## 2016-12-15 NOTE — Telephone Encounter (Signed)
[name] with [place] at[facility]'s office [phone] called and needs 45 minutes for deposition - told her I will send 3 afternoon appointment times for [place] to discuss - she advised subpeona's have been issued.

## 2016-12-22 ENCOUNTER — Other Ambulatory Visit: Payer: Self-pay | Admitting: Physical Medicine & Rehabilitation

## 2016-12-23 ENCOUNTER — Encounter: Payer: Self-pay | Admitting: Registered Nurse

## 2016-12-23 ENCOUNTER — Encounter: Payer: Worker's Compensation | Attending: Registered Nurse | Admitting: Registered Nurse

## 2016-12-23 VITALS — BP 112/70 | HR 88 | Resp 14

## 2016-12-23 DIAGNOSIS — Z9889 Other specified postprocedural states: Secondary | ICD-10-CM | POA: Diagnosis not present

## 2016-12-23 DIAGNOSIS — J45909 Unspecified asthma, uncomplicated: Secondary | ICD-10-CM | POA: Insufficient documentation

## 2016-12-23 DIAGNOSIS — F329 Major depressive disorder, single episode, unspecified: Secondary | ICD-10-CM | POA: Insufficient documentation

## 2016-12-23 DIAGNOSIS — G894 Chronic pain syndrome: Secondary | ICD-10-CM

## 2016-12-23 DIAGNOSIS — M62838 Other muscle spasm: Secondary | ICD-10-CM | POA: Diagnosis not present

## 2016-12-23 DIAGNOSIS — K219 Gastro-esophageal reflux disease without esophagitis: Secondary | ICD-10-CM | POA: Diagnosis not present

## 2016-12-23 DIAGNOSIS — E669 Obesity, unspecified: Secondary | ICD-10-CM | POA: Insufficient documentation

## 2016-12-23 DIAGNOSIS — Z8673 Personal history of transient ischemic attack (TIA), and cerebral infarction without residual deficits: Secondary | ICD-10-CM | POA: Insufficient documentation

## 2016-12-23 DIAGNOSIS — I10 Essential (primary) hypertension: Secondary | ICD-10-CM | POA: Diagnosis not present

## 2016-12-23 DIAGNOSIS — G8929 Other chronic pain: Secondary | ICD-10-CM | POA: Diagnosis present

## 2016-12-23 DIAGNOSIS — G9050 Complex regional pain syndrome I, unspecified: Secondary | ICD-10-CM | POA: Insufficient documentation

## 2016-12-23 DIAGNOSIS — F419 Anxiety disorder, unspecified: Secondary | ICD-10-CM | POA: Insufficient documentation

## 2016-12-23 DIAGNOSIS — Z5181 Encounter for therapeutic drug level monitoring: Secondary | ICD-10-CM | POA: Diagnosis not present

## 2016-12-23 DIAGNOSIS — E785 Hyperlipidemia, unspecified: Secondary | ICD-10-CM | POA: Insufficient documentation

## 2016-12-23 DIAGNOSIS — R200 Anesthesia of skin: Secondary | ICD-10-CM | POA: Diagnosis not present

## 2016-12-23 DIAGNOSIS — Z90710 Acquired absence of both cervix and uterus: Secondary | ICD-10-CM | POA: Diagnosis not present

## 2016-12-23 DIAGNOSIS — Z79899 Other long term (current) drug therapy: Secondary | ICD-10-CM

## 2016-12-23 DIAGNOSIS — Z760 Encounter for issue of repeat prescription: Secondary | ICD-10-CM | POA: Insufficient documentation

## 2016-12-23 DIAGNOSIS — F3289 Other specified depressive episodes: Secondary | ICD-10-CM

## 2016-12-23 DIAGNOSIS — G4730 Sleep apnea, unspecified: Secondary | ICD-10-CM | POA: Insufficient documentation

## 2016-12-23 DIAGNOSIS — M797 Fibromyalgia: Secondary | ICD-10-CM | POA: Insufficient documentation

## 2016-12-23 DIAGNOSIS — I2510 Atherosclerotic heart disease of native coronary artery without angina pectoris: Secondary | ICD-10-CM | POA: Insufficient documentation

## 2016-12-23 DIAGNOSIS — G4700 Insomnia, unspecified: Secondary | ICD-10-CM | POA: Insufficient documentation

## 2016-12-23 MED ORDER — HYDROCODONE-ACETAMINOPHEN 7.5-325 MG PO TABS: ORAL_TABLET | ORAL | 0 refills | Status: DC

## 2016-12-23 NOTE — Progress Notes (Signed)
Subjective:    Patient ID: [name], female    DOB: 1966-04-01, 51 y.o.   MRN: [id number]  HPI: [name] is a 51 year old female who returns for follow up appointment for chronic pain and medication refill. She states her pain is located in her left arm. She rates her pain 8. Her current exercise regime is walking short distances in her home.    Pain Inventory Average Pain 9 Pain Right Now 8 My pain is constant, sharp, burning, stabbing, tingling and aching  In the last 24 hours, has pain interfered with the following? General activity 7 Relation with others 7 Enjoyment of life 6 What TIME of day is your pain at its worst? all Sleep (in general) Poor  Pain is worse with: walking, bending, sitting and standing Pain improves with: rest, heat/ice, therapy/exercise, pacing activities, medication and TENS Relief from Meds: 6  Mobility walk without assistance ability to climb steps?  yes do you drive?  yes  Function disabled: date disabled . I need assistance with the following:  meal prep, household duties and shopping  Neuro/Psych bladder control problems weakness numbness tremor tingling trouble walking spasms confusion depression  Prior Studies Any changes since last visit?  no  Physicians involved in your care Any changes since last visit?  no   Family History  Problem Relation Age of Onset  . Cancer Mother     lung  . Hypertension Mother   . Cancer Father     colon  . Hypertension Sister   . Cancer Sister   . Cancer Brother   . Hyperlipidemia Sister   . Birth defects Maternal Grandmother     breast  . Birth defects Paternal Grandmother     uterine, stomach, lung   Social History   Social History  . Marital status: Married    Spouse name: N/A  . Number of children: 2  . Years of education: N/A   Occupational History  . Disabled due to RSD    Social History Main Topics  . Smoking status: Never Smoker  . Smokeless  tobacco: Never Used  . Alcohol use 0.0 oz/week     Comment: 01/24/2013 "drink or 2 once or /twice/year, if that"  . Drug use: No  . Sexual activity: Not Currently   Other Topics Concern  . None   Social History Narrative  . None   Past Surgical History:  Procedure Laterality Date  . APPENDECTOMY  ~ 06/2007  . BLADDER REPAIR  ~ 06/2007   "same day after bladder lift" (01/24/2013)  . BLADDER SUSPENSION  ~ 06/2007  . DIAGNOSTIC LAPAROSCOPY  1990's & ~ 2000   "I've had a couple; for endometrosis" (01/24/2013)  . HERNIA REPAIR    . INCISIONAL HERNIA REPAIR N/A 01/24/2013   Procedure: LAPAROSCOPIC INCISIONAL HERNIA;  Surgeon: Shelly Rubenstein, MD;  Location: [facility] OR;  Service: General;  Laterality: N/A;  . INSERTION OF MESH N/A 01/24/2013   Procedure: INSERTION OF MESH;  Surgeon: Shelly Rubenstein, MD;  Location: [facility] OR;  Service: General;  Laterality: N/A;  . LAPAROSCOPIC INCISIONAL / UMBILICAL / VENTRAL HERNIA REPAIR  01/24/2013   IHR w/mesh/notes  . NASAL SEPTUM SURGERY  1980's?  . OOPHORECTOMY Right 2009  . TONSILLECTOMY  1990's  . VAGINAL HYSTERECTOMY  ` 06/2007   Past Medical History:  Diagnosis Date  . Allergy   . Anxiety   . Arthritis    "just the norm" (01/24/2013)  . ASCVD (  arteriosclerotic cardiovascular disease)   . Asthma   . Daily headache    "depends on the season" (01/24/2013)  . Fibromyalgia   . GERD (gastroesophageal reflux disease)   . History of colonic polyps   . Hyperlipidemia   . Hypertension   . Migraines   . Obesity (BMI 30.0-34.9)   . RSD (reflex sympathetic dystrophy)   . Sleep apnea    NO MACHINE RECOMMENDED  . TIA (transient ischemic attack) ~ 2009   BP 112/70 (BP Location: Right Arm, Patient Position: Sitting, Cuff Size: Large)   Pulse 88   Resp 14   SpO2 95%   Opioid Risk Score:   Fall Risk Score:  `1  Depression screen PHQ 2/9  [facility] 2/[facility] 10/21/2016 11/26/2015 06/25/2015  Decreased Interest 3 3 3   Down, Depressed, Hopeless 2 2 2   PHQ -  2 Score 5 5 5   Altered sleeping - - 3  Tired, decreased energy - - 3  Change in appetite - - 2  Feeling bad or failure about yourself  - - 3  Trouble concentrating - - 2  Moving slowly or fidgety/restless - - 0  Suicidal thoughts - - 1  PHQ-9 Score - - 19  Difficult doing work/chores - - Somewhat difficult  Some recent data might be hidden   Review of Systems  HENT: Negative.   Eyes: Negative.   Respiratory: Negative.   Cardiovascular: Negative.   Gastrointestinal: Negative.   Endocrine: Negative.   Genitourinary: Negative.   Musculoskeletal: Positive for arthralgias, back pain, gait problem, myalgias and neck pain.       Spasms   Skin: Negative.   Allergic/Immunologic: Negative.   Neurological: Positive for tremors, weakness and numbness.  Hematological: Negative.   Psychiatric/Behavioral: Positive for confusion and dysphoric mood.  All other systems reviewed and are negative.      Objective:   Physical Exam  Constitutional: She is oriented to person, place, and time. She appears well-developed and well-nourished.  HENT:  Head: Normocephalic and atraumatic.  Neck: Normal range of motion. Neck supple.  Cardiovascular: Normal rate and regular rhythm.   Pulmonary/Chest: Effort normal and breath sounds normal.  Musculoskeletal:  Normal Muscle Bulk and Muscle Testing Reveals: Upper Extremities: Right: Full ROM and Muscle Strength 5/5 Left: Decreased ROM 45 Degrees and Muscle Strength  Left AC Joint Tenderness Lower Extremities: Decreased ROM and Muscle Strength 4/5 Arises from Table Slowly Antalgic gait  Neurological: She is alert and oriented to person, place, and time.  Skin: Skin is warm and dry.  Psychiatric: She has a normal mood and affect.  Nursing note and vitals reviewed.         Assessment & Plan:  1.Complex Regional Pain Syndrome Type 1: Continue Topamax and Cymbalta03/05/2017 Refilled:Hydro-codone 7.5/325mg  one tablet twice a day one in the  morning and one at bedtime#60. Continue:Tramadol 100 mg ER daily #30 and Tramadol 50 mg one tablet by mouth BID. One tablet in the afternoon and one tablet in the evening. We will continue the opioid monitoring program, this consists of regular clinic visits, examinations, urine drug screen, pill counts as well as use of [place] Controlled Substance Reporting System. 2. Depression: Continue Cymbalta. 12/23/2016 3. Insomnia: ContinueTrazodone50 mg HS. 12/23/2016 4. Muscle Spasms: Continue Tizanidine.12/23/2016  30 minutes of face to face patient care time was spent during this visit. All questions were encouraged and answered.   F/U in 1 month

## 2017-01-05 LAB — TOXASSURE SELECT,+ANTIDEPR,UR

## 2017-01-17 ENCOUNTER — Encounter: Payer: Self-pay | Admitting: Physical Medicine & Rehabilitation

## 2017-01-27 ENCOUNTER — Encounter: Payer: Self-pay | Admitting: Registered Nurse

## 2017-01-27 ENCOUNTER — Encounter: Payer: Worker's Compensation | Attending: Registered Nurse | Admitting: Registered Nurse

## 2017-01-27 VITALS — BP 115/75 | HR 78

## 2017-01-27 DIAGNOSIS — Z8673 Personal history of transient ischemic attack (TIA), and cerebral infarction without residual deficits: Secondary | ICD-10-CM | POA: Diagnosis not present

## 2017-01-27 DIAGNOSIS — Z760 Encounter for issue of repeat prescription: Secondary | ICD-10-CM | POA: Diagnosis present

## 2017-01-27 DIAGNOSIS — J45909 Unspecified asthma, uncomplicated: Secondary | ICD-10-CM | POA: Insufficient documentation

## 2017-01-27 DIAGNOSIS — Z79899 Other long term (current) drug therapy: Secondary | ICD-10-CM | POA: Diagnosis not present

## 2017-01-27 DIAGNOSIS — F329 Major depressive disorder, single episode, unspecified: Secondary | ICD-10-CM | POA: Insufficient documentation

## 2017-01-27 DIAGNOSIS — M797 Fibromyalgia: Secondary | ICD-10-CM | POA: Diagnosis not present

## 2017-01-27 DIAGNOSIS — G4730 Sleep apnea, unspecified: Secondary | ICD-10-CM | POA: Diagnosis not present

## 2017-01-27 DIAGNOSIS — R51 Headache: Secondary | ICD-10-CM | POA: Diagnosis not present

## 2017-01-27 DIAGNOSIS — G894 Chronic pain syndrome: Secondary | ICD-10-CM

## 2017-01-27 DIAGNOSIS — Z6834 Body mass index (BMI) 34.0-34.9, adult: Secondary | ICD-10-CM | POA: Insufficient documentation

## 2017-01-27 DIAGNOSIS — F419 Anxiety disorder, unspecified: Secondary | ICD-10-CM | POA: Diagnosis not present

## 2017-01-27 DIAGNOSIS — F3289 Other specified depressive episodes: Secondary | ICD-10-CM | POA: Diagnosis not present

## 2017-01-27 DIAGNOSIS — K219 Gastro-esophageal reflux disease without esophagitis: Secondary | ICD-10-CM | POA: Diagnosis not present

## 2017-01-27 DIAGNOSIS — Z5181 Encounter for therapeutic drug level monitoring: Secondary | ICD-10-CM

## 2017-01-27 DIAGNOSIS — G9050 Complex regional pain syndrome I, unspecified: Secondary | ICD-10-CM | POA: Diagnosis not present

## 2017-01-27 DIAGNOSIS — G4700 Insomnia, unspecified: Secondary | ICD-10-CM

## 2017-01-27 DIAGNOSIS — M62838 Other muscle spasm: Secondary | ICD-10-CM | POA: Insufficient documentation

## 2017-01-27 DIAGNOSIS — E669 Obesity, unspecified: Secondary | ICD-10-CM | POA: Diagnosis not present

## 2017-01-27 DIAGNOSIS — G8929 Other chronic pain: Secondary | ICD-10-CM | POA: Diagnosis present

## 2017-01-27 MED ORDER — HYDROCODONE-ACETAMINOPHEN 7.5-325 MG PO TABS
ORAL_TABLET | ORAL | 0 refills | Status: DC
Start: 2017-01-27 — End: 2017-02-11
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
## 2017-02-01 LAB — TOXASSURE SELECT,+ANTIDEPR,UR

## 2017-02-03 ENCOUNTER — Telehealth: Payer: Self-pay | Admitting: *Deleted

## 2017-02-03 NOTE — Telephone Encounter (Signed)
Urine drug screen for this encounter is consistent for prescribed medication. Hydrocodone is listed as unexpected in error as she is being prescribed this medication.

## 2017-02-11 ENCOUNTER — Telehealth: Payer: Self-pay | Admitting: *Deleted

## 2017-02-11 MED ORDER — TRAMADOL HCL 50 MG PO TABS: ORAL_TABLET | ORAL | 2 refills | Status: DC

## 2017-02-11 MED ORDER — TRAMADOL HCL ER 100 MG PO TB24: 100.0000 mg | ORAL_TABLET | Freq: Every day | ORAL | 2 refills | Status: DC

## 2017-02-11 MED ORDER — HYDROCODONE-ACETAMINOPHEN 7.5-325 MG PO TABS: ORAL_TABLET | ORAL | 0 refills | Status: DC
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
## 2017-02-21 ENCOUNTER — Other Ambulatory Visit: Payer: Self-pay | Admitting: Physical Medicine & Rehabilitation

## 2017-02-24 ENCOUNTER — Encounter: Payer: Worker's Compensation | Admitting: Registered Nurse

## 2017-02-24 ENCOUNTER — Telehealth: Payer: Self-pay | Admitting: Registered Nurse

## 2017-02-24 NOTE — Telephone Encounter (Signed)
On 02/24/2017 the NCCSR was reviewed no conflict was seen on the [facility] [place] Cont[place] Reporting System with multiple prescribers. [name] has a signed narcotic contract with our office. If there were any discrepancies this would have been reported to her physician.

## 2017-02-25 ENCOUNTER — Encounter: Payer: Worker's Compensation | Attending: Registered Nurse | Admitting: Registered Nurse

## 2017-02-25 ENCOUNTER — Encounter: Payer: Self-pay | Admitting: Registered Nurse

## 2017-02-25 VITALS — BP 122/70 | HR 70

## 2017-02-25 DIAGNOSIS — F329 Major depressive disorder, single episode, unspecified: Secondary | ICD-10-CM | POA: Insufficient documentation

## 2017-02-25 DIAGNOSIS — E785 Hyperlipidemia, unspecified: Secondary | ICD-10-CM | POA: Insufficient documentation

## 2017-02-25 DIAGNOSIS — M797 Fibromyalgia: Secondary | ICD-10-CM | POA: Insufficient documentation

## 2017-02-25 DIAGNOSIS — G4700 Insomnia, unspecified: Secondary | ICD-10-CM | POA: Insufficient documentation

## 2017-02-25 DIAGNOSIS — F3289 Other specified depressive episodes: Secondary | ICD-10-CM | POA: Diagnosis not present

## 2017-02-25 DIAGNOSIS — F419 Anxiety disorder, unspecified: Secondary | ICD-10-CM | POA: Diagnosis not present

## 2017-02-25 DIAGNOSIS — K219 Gastro-esophageal reflux disease without esophagitis: Secondary | ICD-10-CM | POA: Insufficient documentation

## 2017-02-25 DIAGNOSIS — G9050 Complex regional pain syndrome I, unspecified: Secondary | ICD-10-CM

## 2017-02-25 DIAGNOSIS — I10 Essential (primary) hypertension: Secondary | ICD-10-CM | POA: Insufficient documentation

## 2017-02-25 DIAGNOSIS — M62838 Other muscle spasm: Secondary | ICD-10-CM

## 2017-02-25 DIAGNOSIS — Z8673 Personal history of transient ischemic attack (TIA), and cerebral infarction without residual deficits: Secondary | ICD-10-CM | POA: Insufficient documentation

## 2017-02-25 DIAGNOSIS — Z5181 Encounter for therapeutic drug level monitoring: Secondary | ICD-10-CM

## 2017-02-25 DIAGNOSIS — Z79899 Other long term (current) drug therapy: Secondary | ICD-10-CM | POA: Diagnosis not present

## 2017-02-25 DIAGNOSIS — G90512 Complex regional pain syndrome I of left upper limb: Secondary | ICD-10-CM | POA: Insufficient documentation

## 2017-02-25 DIAGNOSIS — G894 Chronic pain syndrome: Secondary | ICD-10-CM | POA: Diagnosis not present

## 2017-02-25 DIAGNOSIS — G8929 Other chronic pain: Secondary | ICD-10-CM | POA: Diagnosis present

## 2017-02-25 DIAGNOSIS — Z760 Encounter for issue of repeat prescription: Secondary | ICD-10-CM | POA: Diagnosis not present

## 2017-02-25 MED ORDER — HYDROCODONE-ACETAMINOPHEN 7.5-325 MG PO TABS: ORAL_TABLET | ORAL | 0 refills | Status: DC

## 2017-02-25 NOTE — Progress Notes (Signed)
Subjective:    Patient ID: [name], female    DOB: 1966-02-19, 51 y.o.   MRN: [id number]  HPI: [name] is a 51 year old female who returns for follow up appointmentfor chronic pain and medication refill. She states her pain is located in her left arm. She rates her pain 8. Her current exercise regime is walking short distances in her home.   Her last UDS was performed on  01/27/2017, it was consistent.   Pain Inventory Average Pain 8 Pain Right Now 8 My pain is sharp, burning, stabbing, tingling and aching  In the last 24 hours, has pain interfered with the following? General activity 7 Relation with others 7 Enjoyment of life 7 What TIME of day is your pain at its worst? all Sleep (in general) Poor  Pain is worse with: walking, bending, sitting, standing and some activites Pain improves with: rest, therapy/exercise, pacing activities, medication, TENS and injections Relief from Meds: 6  Mobility walk without assistance  Function not employed: date last employed . I need assistance with the following:  meal prep, household duties and shopping  Neuro/Psych bladder control problems weakness numbness tremor tingling trouble walking spasms dizziness depression anxiety  Prior Studies Any changes since last visit?  no  Physicians involved in your care Any changes since last visit?  no   Family History  Problem Relation Age of Onset  . Cancer Mother        lung  . Hypertension Mother   . Cancer Father        colon  . Hypertension Sister   . Cancer Sister   . Cancer Brother   . Hyperlipidemia Sister   . Birth defects Maternal Grandmother        breast  . Birth defects Paternal Grandmother        uterine, stomach, lung   Social History   Social History  . Marital status: Married    Spouse name: N/A  . Number of children: 2  . Years of education: N/A   Occupational History  . Disabled due to RSD    Social History Main  Topics  . Smoking status: Never Smoker  . Smokeless tobacco: Never Used  . Alcohol use 0.0 oz/week     Comment: 01/24/2013 "drink or 2 once or /twice/year, if that"  . Drug use: No  . Sexual activity: Not Currently   Other Topics Concern  . None   Social History Narrative  . None   Past Surgical History:  Procedure Laterality Date  . APPENDECTOMY  ~ 06/2007  . BLADDER REPAIR  ~ 06/2007   "same day after bladder lift" (01/24/2013)  . BLADDER SUSPENSION  ~ 06/2007  . DIAGNOSTIC LAPAROSCOPY  1990's & ~ 2000   "I've had a couple; for endometrosis" (01/24/2013)  . HERNIA REPAIR    . INCISIONAL HERNIA REPAIR N/A 01/24/2013   Procedure: LAPAROSCOPIC INCISIONAL HERNIA;  Surgeon: Harl Bowie, MD;  Location: [place];  Service: General;  Laterality: N/A;  . INSERTION OF MESH N/A 01/24/2013   Procedure: INSERTION OF MESH;  Surgeon: Harl Bowie, MD;  Location: [place];  Service: General;  Laterality: N/A;  . LAPAROSCOPIC INCISIONAL / UMBILICAL / [place]  01/24/2013   IHR w/mesh/notes  . NASAL SEPTUM SURGERY  1980's?  . OOPHORECTOMY Right 2009  . TONSILLECTOMY  1990's  . VAGINAL HYSTERECTOMY  ` 06/2007   Past Medical History:  Diagnosis Date  . Allergy   .  Anxiety   . Arthritis    "just the norm" (01/24/2013)  . ASCVD (arteriosclerotic cardiovascular disease)   . Asthma   . Daily headache    "depends on the season" (01/24/2013)  . Fibromyalgia   . GERD (gastroesophageal reflux disease)   . History of colonic polyps   . Hyperlipidemia   . Hypertension   . Migraines   . Obesity (BMI 30.0-34.9)   . RSD (reflex sympathetic dystrophy)   . Sleep apnea    NO MACHINE RECOMMENDED  . TIA (transient ischemic attack) ~ 2009   There were no vitals taken for this visit.  Opioid Risk Score:   Fall Risk Score:  `1  Depression screen PHQ 2/9  Depression screen [facility] 2/9 10/21/2016 11/26/2015 06/25/2015  Decreased Interest 3 3 3   Down, Depressed, Hopeless 2 2 2   PHQ - 2 Score 5 5 5     Altered sleeping - - 3  Tired, decreased energy - - 3  Change in appetite - - 2  Feeling bad or failure about yourself  - - 3  Trouble concentrating - - 2  Moving slowly or fidgety/restless - - 0  Suicidal thoughts - - 1  PHQ-9 Score - - 19  Difficult doing work/chores - - Somewhat difficult  Some recent data might be hidden    Review of Systems  HENT: Negative.   Eyes: Negative.   Respiratory: Negative.   Cardiovascular: Negative.   Gastrointestinal: Negative.   Endocrine: Negative.   Genitourinary: Positive for difficulty urinating.  Musculoskeletal: Positive for arthralgias, back pain, neck pain and neck stiffness.  Skin: Negative.   Allergic/Immunologic: Negative.   Neurological: Positive for dizziness, tremors, weakness and numbness.       Tingling  Hematological: Negative.   Psychiatric/Behavioral: Positive for dysphoric mood. The patient is nervous/anxious.        Objective:   Physical Exam  Constitutional: She is oriented to person, place, and time. She appears well-developed and well-nourished.  HENT:  Head: Normocephalic and atraumatic.  Neck: Normal range of motion. Neck supple.  Cardiovascular: Normal rate and regular rhythm.   Pulmonary/Chest: Effort normal and breath sounds normal.  Musculoskeletal:  Normal Muscle Bulk and Muscle Testing Reveals: Upper Extremities: Right: Full ROM and Muscle Strength 5/5 Left: Decreased ROM 90 Degrees and Muscle Strength 3/5 Lower Extremities: Decreased ROM and Muscle Strength 4/5 Bilateral Feet Cold to touch/ wearing sandals Arises from table slowly Antalgic gait   Neurological: She is alert and oriented to person, place, and time.  Skin: Skin is warm and dry.  Psychiatric: She has a normal mood and affect.  Nursing note and vitals reviewed.         Assessment & Plan:  1.Complex Regional Pain Syndrome Type 1: Continue Topamax and Cymbalta05/08/2017 Refilled:Hydro-codone 7.5/325mg one tablet twice a day one  in the morning and one at bedtime#60. Continue:Tramadol 100 mg ER daily #30 and Tramadol 50 mg one tablet by mouth BID. One tablet in the afternoon and one tablet in the evening. We will continue the opioid monitoring program, this consists of regular clinic visits, examinations, urine drug screen, pill counts as well as use of [place] Controlled Substance Reporting System. 2. Depression: Continue Cymbalta. 02/25/2017 3. Insomnia: ContinueTrazodone50 mg HS. 02/25/2017 4. Muscle Spasms: Continue Tizanidine.02/25/2017   F/U in 1 month

## 2017-03-06 ENCOUNTER — Other Ambulatory Visit: Payer: Self-pay | Admitting: Physical Medicine & Rehabilitation

## 2017-03-11 ENCOUNTER — Other Ambulatory Visit: Payer: Self-pay | Admitting: Physical Medicine & Rehabilitation

## 2017-03-15 ENCOUNTER — Telehealth: Payer: Self-pay | Admitting: *Deleted

## 2017-03-15 NOTE — Telephone Encounter (Signed)
Vernona RiegerLaura is not getting her meds approved because her case manager is on vacation.  The covering agent says there have not been updated records.  She asks that we fax latest office notes to her attorney "Jomarie Longsattn Christina" @ [phone]. Last office note has been faxed.

## 2017-03-24 ENCOUNTER — Other Ambulatory Visit: Payer: Self-pay | Admitting: Registered Nurse

## 2017-03-28 ENCOUNTER — Other Ambulatory Visit: Payer: Self-pay | Admitting: Internal Medicine

## 2017-03-28 ENCOUNTER — Other Ambulatory Visit: Payer: Self-pay | Admitting: Physical Medicine & Rehabilitation

## 2017-04-04 ENCOUNTER — Encounter: Payer: Worker's Compensation | Attending: Registered Nurse | Admitting: Registered Nurse

## 2017-04-04 ENCOUNTER — Encounter: Payer: Self-pay | Admitting: Registered Nurse

## 2017-04-04 VITALS — BP 112/73 | HR 72

## 2017-04-04 DIAGNOSIS — J45909 Unspecified asthma, uncomplicated: Secondary | ICD-10-CM | POA: Diagnosis not present

## 2017-04-04 DIAGNOSIS — I2510 Atherosclerotic heart disease of native coronary artery without angina pectoris: Secondary | ICD-10-CM | POA: Diagnosis not present

## 2017-04-04 DIAGNOSIS — I10 Essential (primary) hypertension: Secondary | ICD-10-CM | POA: Diagnosis not present

## 2017-04-04 DIAGNOSIS — M797 Fibromyalgia: Secondary | ICD-10-CM | POA: Diagnosis not present

## 2017-04-04 DIAGNOSIS — E785 Hyperlipidemia, unspecified: Secondary | ICD-10-CM | POA: Diagnosis not present

## 2017-04-04 DIAGNOSIS — Z800 Family history of malignant neoplasm of digestive organs: Secondary | ICD-10-CM | POA: Diagnosis not present

## 2017-04-04 DIAGNOSIS — Z8673 Personal history of transient ischemic attack (TIA), and cerebral infarction without residual deficits: Secondary | ICD-10-CM | POA: Diagnosis not present

## 2017-04-04 DIAGNOSIS — Z5181 Encounter for therapeutic drug level monitoring: Secondary | ICD-10-CM

## 2017-04-04 DIAGNOSIS — M62838 Other muscle spasm: Secondary | ICD-10-CM | POA: Diagnosis not present

## 2017-04-04 DIAGNOSIS — Z79899 Other long term (current) drug therapy: Secondary | ICD-10-CM | POA: Diagnosis not present

## 2017-04-04 DIAGNOSIS — Z79891 Long term (current) use of opiate analgesic: Secondary | ICD-10-CM | POA: Insufficient documentation

## 2017-04-04 DIAGNOSIS — G8929 Other chronic pain: Secondary | ICD-10-CM | POA: Diagnosis present

## 2017-04-04 DIAGNOSIS — E669 Obesity, unspecified: Secondary | ICD-10-CM | POA: Diagnosis not present

## 2017-04-04 DIAGNOSIS — F329 Major depressive disorder, single episode, unspecified: Secondary | ICD-10-CM | POA: Insufficient documentation

## 2017-04-04 DIAGNOSIS — G4700 Insomnia, unspecified: Secondary | ICD-10-CM | POA: Insufficient documentation

## 2017-04-04 DIAGNOSIS — F419 Anxiety disorder, unspecified: Secondary | ICD-10-CM | POA: Insufficient documentation

## 2017-04-04 DIAGNOSIS — G894 Chronic pain syndrome: Secondary | ICD-10-CM

## 2017-04-04 DIAGNOSIS — Z801 Family history of malignant neoplasm of trachea, bronchus and lung: Secondary | ICD-10-CM | POA: Diagnosis not present

## 2017-04-04 DIAGNOSIS — G9050 Complex regional pain syndrome I, unspecified: Secondary | ICD-10-CM | POA: Insufficient documentation

## 2017-04-04 DIAGNOSIS — Z8249 Family history of ischemic heart disease and other diseases of the circulatory system: Secondary | ICD-10-CM | POA: Diagnosis not present

## 2017-04-04 DIAGNOSIS — K219 Gastro-esophageal reflux disease without esophagitis: Secondary | ICD-10-CM | POA: Insufficient documentation

## 2017-04-04 DIAGNOSIS — F3289 Other specified depressive episodes: Secondary | ICD-10-CM

## 2017-04-04 DIAGNOSIS — G4730 Sleep apnea, unspecified: Secondary | ICD-10-CM | POA: Insufficient documentation

## 2017-04-04 MED ORDER — TRAMADOL HCL 50 MG PO TABS: ORAL_TABLET | ORAL | 2 refills | Status: DC

## 2017-04-04 MED ORDER — TRAMADOL HCL ER 100 MG PO TB24: 100.0000 mg | ORAL_TABLET | Freq: Every day | ORAL | 2 refills | Status: DC

## 2017-04-04 MED ORDER — HYDROCODONE-ACETAMINOPHEN 7.5-325 MG PO TABS
ORAL_TABLET | ORAL | 0 refills | Status: DC
Start: 2017-04-04 — End: 2017-05-05

## 2017-04-04 NOTE — Progress Notes (Signed)
Subjective:    Patient ID: [name], female    DOB: [birth year], 51 y.o.   MRN: [id number]  HPI: [name] is a 51 year old female who returns for follow up appointmentfor chronic pain and medication refill. She states her pain is located in her left arm and complaining of generalized pain all over. She rates her pain 9. Her current exercise regime is walking short distances in her home.   Her last UDS was performed on  01/27/2017, it was consistent.   Pain Inventory Average Pain 8 Pain Right Now 9 My pain is constant, sharp, burning, stabbing, tingling and aching  In the last 24 hours, has pain interfered with the following? General activity 10 Relation with others 10 Enjoyment of life 10 What TIME of day is your pain at its worst? all Sleep (in general) Poor  Pain is worse with: some activites Pain improves with: medication Relief from Meds: 6  Mobility ability to climb steps?  yes do you drive?  yes  Function not employed: date last employed . I need assistance with the following:  meal prep, household duties and shopping  Neuro/Psych bladder control problems weakness numbness tremor tingling trouble walking spasms dizziness confusion depression loss of taste or smell  Prior Studies Any changes since last visit?  no  Physicians involved in your care Any changes since last visit?  no   Family History  Problem Relation Age of Onset  . Cancer Mother        lung  . Hypertension Mother   . Cancer Father        colon  . Hypertension Sister   . Cancer Sister   . Cancer Brother   . Hyperlipidemia Sister   . Birth defects Maternal Grandmother        breast  . Birth defects Paternal Grandmother        uterine, stomach, lung   Social History   Social History  . Marital status: Married    Spouse name: N/A  . Number of children: 2  . Years of education: N/A   Occupational History  . Disabled due to RSD    Social History Main  Topics  . Smoking status: Never Smoker  . Smokeless tobacco: Never Used  . Alcohol use 0.0 oz/week     Comment: 01/24/2013 "drink or 2 once or /twice/year, if that"  . Drug use: No  . Sexual activity: Not Currently   Other Topics Concern  . None   Social History Narrative  . None   Past Surgical History:  Procedure Laterality Date  . APPENDECTOMY  ~ 06/2007  . BLADDER REPAIR  ~ 06/2007   "same day after bladder lift" (01/24/2013)  . BLADDER SUSPENSION  ~ 06/2007  . DIAGNOSTIC LAPAROSCOPY  1990's & ~ 2000   "I've had a couple; for endometrosis" (01/24/2013)  . HERNIA REPAIR    . INCISIONAL HERNIA REPAIR N/A 01/24/2013   Procedure: LAPAROSCOPIC INCISIONAL HERNIA;  Surgeon: Shelly Rubenstein, MD;  Location: [facility] OR;  Service: General;  Laterality: N/A;  . INSERTION OF MESH N/A 01/24/2013   Procedure: INSERTION OF MESH;  Surgeon: Shelly Rubenstein, MD;  Location: [facility] OR;  Service: General;  Laterality: N/A;  . LAPAROSCOPIC INCISIONAL / UMBILICAL / VENTRAL HERNIA REPAIR  01/24/2013   IHR w/mesh/notes  . NASAL SEPTUM SURGERY  1980's?  . OOPHORECTOMY Right 2009  . TONSILLECTOMY  1990's  . VAGINAL HYSTERECTOMY  ` 06/2007   Past Medical  History:  Diagnosis Date  . Allergy   . Anxiety   . Arthritis    "just the norm" (01/24/2013)  . ASCVD (arteriosclerotic cardiovascular disease)   . Asthma   . Daily headache    "depends on the season" (01/24/2013)  . Fibromyalgia   . GERD (gastroesophageal reflux disease)   . History of colonic polyps   . Hyperlipidemia   . Hypertension   . Migraines   . Obesity (BMI 30.0-34.9)   . RSD (reflex sympathetic dystrophy)   . Sleep apnea    NO MACHINE RECOMMENDED  . TIA (transient ischemic attack) ~ 2009   BP 112/73   Pulse 72   SpO2 96%   Opioid Risk Score:   Fall Risk Score:  `1  Depression screen PHQ 2/9  [facility] 04/04/2017 10/21/2016 11/26/2015 06/25/2015  Decreased Interest 3 3 3 3   Down, Depressed, Hopeless 2 2 2 2   PHQ - 2 Score 5 5  5 5   Altered sleeping - - - 3  Tired, decreased energy - - - 3  Change in appetite - - - 2  Feeling bad or failure about yourself  - - - 3  Trouble concentrating - - - 2  Moving slowly or fidgety/restless - - - 0  Suicidal thoughts - - - 1  PHQ-9 Score - - - 19  Difficult doing work/chores - - - Somewhat difficult  Some recent data might be hidden    Review of Systems  HENT: Negative.   Eyes: Negative.   Respiratory: Negative.   Cardiovascular: Negative.   Gastrointestinal: Negative.   Endocrine: Negative.   Genitourinary: Negative.   Musculoskeletal: Positive for gait problem.       Spasms  Skin: Negative.   Neurological: Positive for dizziness, tremors, weakness and numbness.       Tingling  Hematological: Negative.   Psychiatric/Behavioral: Positive for confusion and dysphoric mood.  All other systems reviewed and are negative.      Objective:   Physical Exam  Constitutional: She is oriented to person, place, and time. She appears well-developed and well-nourished.  HENT:  Head: Normocephalic and atraumatic.  Neck: Normal range of motion. Neck supple.  Cardiovascular: Normal rate and regular rhythm.   Pulmonary/Chest: Effort normal and breath sounds normal.  Musculoskeletal:  Normal Muscle Bulk and Muscle Testing Reveals: Upper Extremities: Right: Full ROM and Muscle Strength 5/5 Left: Decreased ROM 90 Degrees and Muscle Strength 3/5 Thoracic Hypersensitivity: T-1-T-7 Mainly Left Side Lumbar Hypersensitivity Lower Extremities: Right: Full ROM and Muscle Strength 5/5 Left: Decreased ROM and Muscle Strength 4/5 Left Lower Extremity Flexion Produces Pain into Lumbar, Left Hip and Left Lower Extremity Arises from Table Slowly Antalgic Gait  Neurological: She is alert and oriented to person, place, and time.  Skin: Skin is warm and dry.  Psychiatric: She has a normal mood and affect.  Nursing note and vitals reviewed.         Assessment & Plan:  1.Complex  Regional Pain Syndrome Type 1: Continue Topamax and Cymbalta06/18/2018 Refilled:Hydro-codone 7.5/325mg  one tablet twice a day one in the morning and one at bedtime#60. Continue:Tramadol 100 mg ER daily #30 and Tramadol 50 mg one tablet by mouth BID. One tablet in the afternoon and one tablet in the evening. We will continue the opioid monitoring program, this consists of regular clinic visits, examinations, urine drug screen, pill counts as well as use of [place] Controlled Substance Reporting System. 2. Depression: Continue Cymbalta. 04/04/2017 3. Insomnia:  ContinueTrazodone50 mg HS. 04/04/2017 4. Muscle Spasms: Continue Tizanidine.04/04/2017  20  minutes of face to face patient care time was spent during this visit. All questions were encouraged and answered.  F/U in 1 month

## 2017-04-21 ENCOUNTER — Other Ambulatory Visit: Payer: Self-pay | Admitting: Physical Medicine & Rehabilitation

## 2017-04-25 DIAGNOSIS — R197 Diarrhea, unspecified: Secondary | ICD-10-CM | POA: Diagnosis not present

## 2017-04-25 DIAGNOSIS — Z86010 Personal history of colonic polyps: Secondary | ICD-10-CM | POA: Diagnosis not present

## 2017-04-25 DIAGNOSIS — K5900 Constipation, unspecified: Secondary | ICD-10-CM | POA: Diagnosis not present

## 2017-04-25 DIAGNOSIS — Z800 Family history of malignant neoplasm of digestive organs: Secondary | ICD-10-CM | POA: Diagnosis not present

## 2017-05-02 ENCOUNTER — Other Ambulatory Visit: Payer: Self-pay

## 2017-05-02 ENCOUNTER — Encounter: Payer: Self-pay | Admitting: Internal Medicine

## 2017-05-02 ENCOUNTER — Ambulatory Visit (INDEPENDENT_AMBULATORY_CARE_PROVIDER_SITE_OTHER): Payer: Medicare Other | Admitting: Internal Medicine

## 2017-05-02 ENCOUNTER — Telehealth: Payer: Self-pay | Admitting: *Deleted

## 2017-05-02 VITALS — BP 116/84 | HR 74 | Temp 97.9°F | Ht 67.5 in | Wt 172.0 lb

## 2017-05-02 DIAGNOSIS — G90529 Complex regional pain syndrome I of unspecified lower limb: Secondary | ICD-10-CM | POA: Diagnosis not present

## 2017-05-02 DIAGNOSIS — F39 Unspecified mood [affective] disorder: Secondary | ICD-10-CM

## 2017-05-02 DIAGNOSIS — J4520 Mild intermittent asthma, uncomplicated: Secondary | ICD-10-CM

## 2017-05-02 DIAGNOSIS — E876 Hypokalemia: Secondary | ICD-10-CM

## 2017-05-02 DIAGNOSIS — Z7189 Other specified counseling: Secondary | ICD-10-CM | POA: Diagnosis not present

## 2017-05-02 DIAGNOSIS — Z0000 Encounter for general adult medical examination without abnormal findings: Secondary | ICD-10-CM | POA: Diagnosis not present

## 2017-05-02 DIAGNOSIS — I10 Essential (primary) hypertension: Secondary | ICD-10-CM

## 2017-05-02 LAB — CBC WITH DIFFERENTIAL/PLATELET
Basophils Absolute: 0.1 10*3/uL (ref 0.0–0.1)
Basophils Relative: 0.7 % (ref 0.0–3.0)
EOS ABS: 0.6 10*3/uL (ref 0.0–0.7)
EOS PCT: 6.4 % — AB (ref 0.0–5.0)
HCT: 42.1 % (ref 36.0–46.0)
HEMOGLOBIN: 14.1 g/dL (ref 12.0–15.0)
Lymphocytes Relative: 25.3 % (ref 12.0–46.0)
Lymphs Abs: 2.3 10*3/uL (ref 0.7–4.0)
MCHC: 33.5 g/dL (ref 30.0–36.0)
MCV: 87.4 fl (ref 78.0–100.0)
Monocytes Absolute: 0.5 10*3/uL (ref 0.1–1.0)
Monocytes Relative: 5.1 % (ref 3.0–12.0)
Neutro Abs: 5.6 10*3/uL (ref 1.4–7.7)
Neutrophils Relative %: 62.5 % (ref 43.0–77.0)
Platelets: 250 10*3/uL (ref 150.0–400.0)
RBC: 4.82 Mil/uL (ref 3.87–5.11)
RDW: 13.4 % (ref 11.5–15.5)
WBC: 9 10*3/uL (ref 4.0–10.5)

## 2017-05-02 LAB — COMPREHENSIVE METABOLIC PANEL
ALBUMIN: 4.7 g/dL (ref 3.5–5.2)
ALK PHOS: 71 U/L (ref 39–117)
ALT: 19 U/L (ref 0–35)
AST: 19 U/L (ref 0–37)
BUN: 18 mg/dL (ref 6–23)
CALCIUM: 10 mg/dL (ref 8.4–10.5)
CO2: 32 mEq/L (ref 19–32)
CREATININE: 1 mg/dL (ref 0.40–1.20)
Chloride: 98 mEq/L (ref 96–112)
GFR: 61.62 mL/min (ref >60.00)
Glucose, Bld: 103 mg/dL — ABNORMAL HIGH (ref 70–99)
POTASSIUM: 2.7 meq/L — AB (ref 3.5–5.1)
SODIUM: 138 meq/L (ref 135–145)
TOTAL PROTEIN: 7.4 g/dL (ref 6.0–8.3)
Total Bilirubin: 0.4 mg/dL (ref 0.2–1.2)

## 2017-05-02 LAB — LDL CHOLESTEROL, DIRECT: Direct LDL: 103 mg/dL

## 2017-05-02 LAB — T4, FREE: FREE T4: 0.93 ng/dL (ref 0.60–1.60)

## 2017-05-02 LAB — LIPID PANEL
CHOL/HDL RATIO: 6
CHOLESTEROL: 227 mg/dL — AB (ref 0–200)
HDL: 37 mg/dL — AB (ref >39.00)
Triglycerides: 464 mg/dL — ABNORMAL HIGH (ref 0.0–149.0)

## 2017-05-02 MED ORDER — ALBUTEROL SULFATE HFA 108 (90 BASE) MCG/ACT IN AERS: 2.0000 | INHALATION_SPRAY | RESPIRATORY_TRACT | 5 refills | Status: DC | PRN

## 2017-05-02 MED ORDER — ALBUTEROL SULFATE HFA 108 (90 BASE) MCG/ACT IN AERS: 2.0000 | INHALATION_SPRAY | Freq: Four times a day (QID) | RESPIRATORY_TRACT | 5 refills | Status: DC | PRN

## 2017-05-02 NOTE — Telephone Encounter (Signed)
Please call her The labs are fine other than a low potassium--this is new from last year Have her come back in anytime this week (preferably tomorrow or Wednesday) to recheck She should not be fasting

## 2017-05-02 NOTE — Telephone Encounter (Signed)
Left detailed message on vm per dpr to call the office and schedule a nonfasting lab visit tomorrow or Wednesday to recheck her Potassium. Will follow-up tomorrow to make sure he made an appt.

## 2017-05-02 NOTE — Assessment & Plan Note (Signed)
Doing well with just occasional albuterol inhaler use

## 2017-05-02 NOTE — Progress Notes (Signed)
Subjective:    Patient ID: [name], female    DOB: [birth year], 51 y.o.   MRN: [id number]  HPI Here for initial Medicare preventative visit and follow up of chronic health conditions No longer on husband's insurance Reviewed form and advanced directives Reviewed other doctors--Dr Caffie Pinto, Dr Azucena Cecil, Dr Ferdinand Lango (GI), dentist (?Ulice Bold) Is concerned about her memory--- will lose her bearings in car, misspelling (disjoint between what she is thinking and writing) Chronic depression ongoing Vision is okay--due for exam Hearing is okay. Tested here today-- threshold 20 dB at all frequencies except 1000dB not heard in either ear Has had several falls--no significant injuries Drives locally at times--- will need help bathing, etc at times (depending on how she is doing) No hospitalizations or procedures in past year Not able to exercise  Ongoing pain issues Continues with the pain doctor Multidrug regimen which he monitors Will get severe numbness and pain with tingling in legs from the RSD--lidocaine patch doesn't help on feet  Ongoing mood issues Still grieving father Depression due to pain also No suicidal ideation but pain is severe at times Doesn't sleep well --trazodone does help though  No chest pain No SOB Does have cough no regular wheezing--proair helps when needed No syncope--but chronic dizziness (relates to poor sleep) Edema--due to RSD Occasional migraine headache  Current Outpatient Prescriptions on File Prior to Visit  Medication Sig Dispense Refill  . albuterol (PROAIR HFA) 108 (90 Base) MCG/ACT inhaler Inhale 2 puffs into the lungs every 4 (four) hours as needed. 1 Inhaler 4  . clobetasol cream (TEMOVATE) 5.46 % Apply 1 application topically 2 (two) times daily. 30 g 0  . CYMBALTA 30 MG capsule TAKE 3 CAPSULES BY MOUTH DAILY. 270 capsule 2  . esomeprazole (NEXIUM) 40 MG capsule TAKE 1 CAPSULE (40 MG TOTAL) BY MOUTH 2 (TWO) TIMES DAILY. 60  capsule 1  . fluticasone (FLONASE) 50 MCG/ACT nasal spray Place 2 sprays into both nostrils daily. 16 g 6  . HYDROcodone-acetaminophen (NORCO) 7.5-325 MG tablet TAKE 1 TABLET Twice a Day. One in the Morning and one at Bedtime 60 tablet 0  . ibuprofen (ADVIL,MOTRIN) 600 MG tablet TAKE 1 TABLET (600 MG TOTAL) BY MOUTH 3 (THREE) TIMES DAILY AS NEEDED. 90 tablet 8  . lidocaine (LIDODERM) 5 % MAY USE UP TO 3 PATCHES DAILY. REMOVE AND DISCARED PATCHES AFTER 12 HOURS OR AS DIRECTED 90 patch 4  . loratadine (CLARITIN) 10 MG tablet Take 10 mg by mouth daily.      Marland Kitchen losartan-hydrochlorothiazide (HYZAAR) 100-25 MG tablet TAKE 1 TABLET BY MOUTH DAILY. 30 tablet 11  . tiZANidine (ZANAFLEX) 4 MG tablet TAKE 1 TABLET (4 MG TOTAL) BY MOUTH 4 (FOUR) TIMES DAILY. 120 tablet 3  . topiramate (TOPAMAX) 100 MG tablet TAKE 1 TABLET BY MOUTH EVERY MORNING AND 2 TABLETS BY MOUTH EVERY EVENING 90 tablet 4  . traMADol (ULTRAM) 50 MG tablet TAKE 1 TABLET BY MOUTH TWICE A DAY. DIRECTION CHANGE 60 tablet 2  . traMADol (ULTRAM-ER) 100 MG 24 hr tablet Take 1 tablet (100 mg total) by mouth daily. 30 tablet 2  . traZODone (DESYREL) 50 MG tablet TAKE 1 TABLET (50 MG TOTAL) BY MOUTH AT BEDTIME. 30 tablet 2   No current facility-administered medications on file prior to visit.     Allergies  Allergen Reactions  . Demerol [Meperidine] Anaphylaxis, Itching and Swelling  . Cymbalta [Duloxetine Hcl] Hives    Generic makes her feel like she is crawling out of  her skin  . Diazepam     REACTION: swelling  . Meperidine Hcl     REACTION: swelling  . Other     [place]olog[place]    Past Medical History:  Diagnosis Date  . Allergy   . Anxiety   . Arthritis    "just the norm" (01/24/2013)  . ASCVD (arteriosclerotic cardiovascular disease)   . Asthma   . Daily headache    "depends on the season" (01/24/2013)  . Fibromyalgia   . GERD (gastroesophageal reflux disease)   . History of colonic polyps   . Hyperlipidemia   . Hypertension     . Migraines   . Obesity (BMI 30.0-34.9)   . RSD (reflex sympathetic dystrophy)   . Sleep apnea    NO MACHINE RECOMMENDED  . TIA (transient ischemic attack) ~ 2009    Past Surgical History:  Procedure Laterality Date  . APPENDECTOMY  ~ 06/2007  . BLADDER REPAIR  ~ 06/2007   "same day after bladder lift" (01/24/2013)  . BLADDER SUSPENSION  ~ 06/2007  . DIAGNOSTIC LAPAROSCOPY  1990's & ~ 2000   "I've had a couple; for endometrosis" (01/24/2013)  . HERNIA REPAIR    . INCISIONAL HERNIA REPAIR N/A 01/24/2013   Procedure: LAPAROSCOPIC INCISIONAL HERNIA;  Surgeon: Harl Bowie, MD;  Location: [facility];  Service: General;  Laterality: N/A;  . INSERTION OF MESH N/A 01/24/2013   Procedure: INSERTION OF MESH;  Surgeon: Harl Bowie, MD;  Location: [place];  Service: General;  Laterality: N/A;  . LAPAROSCOPIC INCISIONAL / UMBILICAL / [place]  01/24/2013   IHR w/mesh/notes  . NASAL SEPTUM SURGERY  1980's?  . OOPHORECTOMY Right 2009  . TONSILLECTOMY  1990's  . VAGINAL HYSTERECTOMY  ` 06/2007    Family History  Problem Relation Age of Onset  . Cancer Mother        lung  . Hypertension Mother   . Cancer Father        colon  . Hypertension Sister   . Cancer Sister   . Cancer Brother   . Hyperlipidemia Sister   . Birth defects Maternal Grandmother        breast  . Birth defects Paternal Grandmother        uterine, stomach, lung    Social History   Social History  . Marital status: Married    Spouse name: N/A  . Number of children: 2  . Years of education: N/A   Occupational History  . Disabled due to RSD    Social History Main Topics  . Smoking status: Never Smoker  . Smokeless tobacco: Never Used  . Alcohol use 0.0 oz/week     Comment: 01/24/2013 "drink or 2 once or /twice/year, if that"  . Drug use: No  . Sexual activity: Not Currently   Other Topics Concern  . Not on file   Social History Narrative  . No narrative on file   Review of Systems Appetite is  okay Weight is down 11#-- trying to be more careful Wears seat belt Teeth okay--needs to set up with dentist Bowels are highly variable--"from one extreme to the next" No skin ulcers or rashes Some urinary frequency and leakage-- uses pads for protection    Objective:   Physical Exam  Constitutional: She is oriented to person, place, and time. She appears well-nourished. No distress.  HENT:  Mouth/Throat: Oropharynx is clear and moist. No oropharyngeal exudate.  Neck: Normal range of motion. No thyromegaly  present.  Cardiovascular: Normal rate, regular rhythm, normal heart sounds and intact distal pulses.  Exam reveals no gallop.   No murmur heard. Pulmonary/Chest: Effort normal and breath sounds normal. No respiratory distress. She has no wheezes. She has no rales.  Abdominal: Soft. There is no tenderness.  Musculoskeletal: She exhibits no edema.  Lymphadenopathy:    She has no cervical adenopathy.  Neurological: She is alert and oriented to person, place, and time.  President--- "Dwaine Deter, Clinton----- ?" [id number] Recall 3/3  hyperaesthesia in feet  Skin: No rash noted. No erythema.  Psychiatric: She has a normal mood and affect. Her behavior is normal.          Assessment & Plan:

## 2017-05-02 NOTE — Assessment & Plan Note (Signed)
BP Readings from Last 3 Encounters:  05/02/17 116/84  04/04/17 112/73  02/25/17 122/70   Good control

## 2017-05-02 NOTE — Assessment & Plan Note (Signed)
See social history 

## 2017-05-02 NOTE — Assessment & Plan Note (Signed)
Worsened symptoms in legs now Will discuss with Dr Larna DaughtersKirstens

## 2017-05-02 NOTE — Assessment & Plan Note (Signed)
Chronic depression and anxiety---largely related to chronic pain On cymbalta and trazodone for sleep

## 2017-05-02 NOTE — Telephone Encounter (Signed)
Rx changed to Ventolin and sent to pharmacy

## 2017-05-02 NOTE — Telephone Encounter (Signed)
CRITICAL VALUE STICKER  CRITICAL VALUE: Potassium 2.7  RECEIVER: [name], [place] NOTIFIED: 05/02/17 @ 3:11  MESSENGER: [facility]  MD NOTIFIED: Dr. Silvio Pate  TIME OF NOTIFICATION: 3:13  RESPONSE:

## 2017-05-02 NOTE — Assessment & Plan Note (Addendum)
I have personally reviewed the Medicare Annual Wellness questionnaire and have noted 1. The patient's medical and social history 2. Their use of alcohol, tobacco or illicit drugs 3. Their current medications and supplements 4. The patient's functional ability including ADL's, fall risks, home safety risks and hearing or visual             impairment. 5. Diet and physical activities 6. Evidence for depression or mood disorders  The patients weight, height, BMI and visual acuity have been recorded in the chart I have made referrals, counseling and provided education to the patient based review of the above and I have provided the pt with a written personalized care plan for preventive services.  I have provided you with a copy of your personalized plan for preventive services. Please take the time to review along with your updated medication list.  UTD on vaccines Having colon next month She is going to set up her mammogram Will be going to gyn soon (now on insurance) Unable to exercise Will defer DEXA due to age EKG on file

## 2017-05-03 ENCOUNTER — Other Ambulatory Visit (INDEPENDENT_AMBULATORY_CARE_PROVIDER_SITE_OTHER): Payer: Medicare Other

## 2017-05-03 ENCOUNTER — Encounter: Payer: Self-pay | Admitting: Internal Medicine

## 2017-05-03 ENCOUNTER — Telehealth: Payer: Self-pay | Admitting: Radiology

## 2017-05-03 DIAGNOSIS — E876 Hypokalemia: Secondary | ICD-10-CM

## 2017-05-03 LAB — POTASSIUM: POTASSIUM: 2.7 meq/L — AB (ref 3.5–5.1)

## 2017-05-03 NOTE — Telephone Encounter (Signed)
[name] called a critical  K+ - 2.7, result called to Dr Alphonsus SiasLetvak

## 2017-05-03 NOTE — Telephone Encounter (Signed)
Spoke to pt. Made lab appt today

## 2017-05-04 MED ORDER — POTASSIUM CHLORIDE CRYS ER 20 MEQ PO TBCR: 20.0000 meq | EXTENDED_RELEASE_TABLET | Freq: Once | ORAL | 1 refills | Status: DC

## 2017-05-04 NOTE — Telephone Encounter (Signed)
Spoke to pt. Made lab appt 05-17-17. Sent potassium to [facility]. She will Google a High Potassium Diet.

## 2017-05-04 NOTE — Telephone Encounter (Signed)
Please call her The potassium is still low. Send Rx for KCl 20 meq daily Send info on high potassium foods (which tend to be healthier and may reduce BP) We will recheck in about 2 weeks--set this up. If her potassium stays low, we will consider stopping the HCTZ portion of her BP medication

## 2017-05-05 ENCOUNTER — Encounter: Payer: Worker's Compensation | Attending: Registered Nurse | Admitting: Registered Nurse

## 2017-05-05 ENCOUNTER — Encounter: Payer: Self-pay | Admitting: Registered Nurse

## 2017-05-05 VITALS — BP 104/71 | HR 77 | Resp 16 | Wt 172.4 lb

## 2017-05-05 DIAGNOSIS — I10 Essential (primary) hypertension: Secondary | ICD-10-CM | POA: Insufficient documentation

## 2017-05-05 DIAGNOSIS — G9050 Complex regional pain syndrome I, unspecified: Secondary | ICD-10-CM | POA: Diagnosis not present

## 2017-05-05 DIAGNOSIS — Z5181 Encounter for therapeutic drug level monitoring: Secondary | ICD-10-CM

## 2017-05-05 DIAGNOSIS — F3289 Other specified depressive episodes: Secondary | ICD-10-CM | POA: Diagnosis not present

## 2017-05-05 DIAGNOSIS — F419 Anxiety disorder, unspecified: Secondary | ICD-10-CM | POA: Insufficient documentation

## 2017-05-05 DIAGNOSIS — M62838 Other muscle spasm: Secondary | ICD-10-CM | POA: Diagnosis not present

## 2017-05-05 DIAGNOSIS — K219 Gastro-esophageal reflux disease without esophagitis: Secondary | ICD-10-CM | POA: Insufficient documentation

## 2017-05-05 DIAGNOSIS — G90512 Complex regional pain syndrome I of left upper limb: Secondary | ICD-10-CM | POA: Diagnosis not present

## 2017-05-05 DIAGNOSIS — M797 Fibromyalgia: Secondary | ICD-10-CM | POA: Insufficient documentation

## 2017-05-05 DIAGNOSIS — F329 Major depressive disorder, single episode, unspecified: Secondary | ICD-10-CM | POA: Insufficient documentation

## 2017-05-05 DIAGNOSIS — G4700 Insomnia, unspecified: Secondary | ICD-10-CM

## 2017-05-05 DIAGNOSIS — Z8673 Personal history of transient ischemic attack (TIA), and cerebral infarction without residual deficits: Secondary | ICD-10-CM | POA: Diagnosis not present

## 2017-05-05 DIAGNOSIS — Z760 Encounter for issue of repeat prescription: Secondary | ICD-10-CM | POA: Insufficient documentation

## 2017-05-05 DIAGNOSIS — Z79899 Other long term (current) drug therapy: Secondary | ICD-10-CM

## 2017-05-05 DIAGNOSIS — E785 Hyperlipidemia, unspecified: Secondary | ICD-10-CM | POA: Insufficient documentation

## 2017-05-05 DIAGNOSIS — G894 Chronic pain syndrome: Secondary | ICD-10-CM

## 2017-05-05 DIAGNOSIS — G8929 Other chronic pain: Secondary | ICD-10-CM | POA: Diagnosis present

## 2017-05-05 MED ORDER — HYDROCODONE-ACETAMINOPHEN 7.5-325 MG PO TABS: ORAL_TABLET | ORAL | 0 refills | Status: DC

## 2017-05-05 NOTE — Progress Notes (Signed)
Subjective:    Patient ID: [name], female    DOB: 1966-02-12, 51 y.o.   MRN: [id number]  HPI: [name] is a 51 year old female who returns for follow up appointmentfor chronic pain and medication refill. She states her pain is located in her left arm and complaining of generalized pain all over. She rates her pain 8. Her current exercise regime is walking short distances in her home and pool therapy three times a week.  Her last UDS was performed on 01/27/2017, it was consistent.    Pain Inventory Average Pain 8 Pain Right Now 8 My pain is sharp, burning, stabbing, tingling and aching  In the last 24 hours, has pain interfered with the following? General activity 8 Relation with others 8 Enjoyment of life 8 What TIME of day is your pain at its worst? All Sleep (in general) Poor  Pain is worse with: walking, bending, sitting and standing Pain improves with: rest, therapy/exercise, pacing activities, medication, TENS and injections Relief from Meds: 6  Mobility ability to climb steps?  yes do you drive?  yes  Function disabled: date disabled 09/25/13  Neuro/Psych bladder control problems weakness numbness tremor tingling trouble walking spasms dizziness depression anxiety  Prior Studies Any changes since last visit?  no  Physicians involved in your care Any changes since last visit?  no   Family History  Problem Relation Age of Onset  . Cancer Mother        lung  . Hypertension Mother   . Cancer Father        colon  . Hypertension Sister   . Cancer Sister   . Cancer Brother   . Hyperlipidemia Sister   . Birth defects Maternal Grandmother        breast  . Birth defects Paternal Grandmother        uterine, stomach, lung   Social History   Social History  . Marital status: Married    Spouse name: N/A  . Number of children: 2  . Years of education: N/A   Occupational History  . Disabled due to RSD    Social History Main  Topics  . Smoking status: Never Smoker  . Smokeless tobacco: Never Used  . Alcohol use 0.0 oz/week     Comment: 01/24/2013 "drink or 2 once or /twice/year, if that"  . Drug use: No  . Sexual activity: Not Currently   Other Topics Concern  . Not on file   Social History Narrative   No living will   Would want husband as POA--then sister, [name]   Would accept resuscitation attempts   Probably would not want tube feeds if cognitively unaware   Past Surgical History:  Procedure Laterality Date  . APPENDECTOMY  ~ 06/2007  . BLADDER REPAIR  ~ 06/2007   "same day after bladder lift" (01/24/2013)  . BLADDER SUSPENSION  ~ 06/2007  . DIAGNOSTIC LAPAROSCOPY  1990's & ~ 2000   "I've had a couple; for endometrosis" (01/24/2013)  . HERNIA REPAIR    . INCISIONAL HERNIA REPAIR N/A 01/24/2013   Procedure: LAPAROSCOPIC INCISIONAL HERNIA;  Surgeon: Shelly Rubenstein, MD;  [facility];  Service: General;  Laterality: N/A;  . INSERTION OF MESH N/A 01/24/2013   Procedure: INSERTION OF MESH;  Surgeon: Shelly Rubenstein, MD;  Location: [facility] OR;  Service: General;  Laterality: N/A;  . LAPAROSCOPIC INCISIONAL / UMBILICAL / VENTRAL HERNIA REPAIR  01/24/2013   IHR w/mesh/notes  . NASAL  SEPTUM SURGERY  1980's?  . OOPHORECTOMY Right 2009  . TONSILLECTOMY  1990's  . VAGINAL HYSTERECTOMY  ` 06/2007   Past Medical History:  Diagnosis Date  . Allergy   . Anxiety   . Arthritis    "just the norm" (01/24/2013)  . ASCVD (arteriosclerotic cardiovascular disease)   . Asthma   . Daily headache    "depends on the season" (01/24/2013)  . Fibromyalgia   . GERD (gastroesophageal reflux disease)   . History of colonic polyps   . Hyperlipidemia   . Hypertension   . Migraines   . Obesity (BMI 30.0-34.9)   . RSD (reflex sympathetic dystrophy)   . Sleep apnea    NO MACHINE RECOMMENDED  . TIA (transient ischemic attack) ~ 2009   There were no vitals taken for this visit.  Opioid Risk Score:   Fall Risk Score:   `1  Depression screen PHQ 2/9  Dep[facility] screen [facility] 04/04/2017 10/21/2016 11/26/2015 06/25/2015  Decreased Interest 3 3 3 3   Down, Depressed, Hopeless 2 2 2 2   PHQ - 2 Score 5 5 5 5   Altered sleeping - - - 3  Tired, decreased energy - - - 3  Change in appetite - - - 2  Feeling bad or failure about yourself  - - - 3  Trouble concentrating - - - 2  Moving slowly or fidgety/restless - - - 0  Suicidal thoughts - - - 1  PHQ-9 Score - - - 19  Difficult doing work/chores - - - Somewhat difficult  Some recent data might be hidden     Review of Systems  Genitourinary: Positive for dyspareunia.  Musculoskeletal: Positive for gait problem.  Neurological: Positive for tremors, weakness and numbness.       Tingling Dizziness Spasms   Psychiatric/Behavioral: Positive for dysphoric mood. The patient is nervous/anxious.   All other systems reviewed and are negative.      Objective:   Physical Exam  Constitutional: She is oriented to person, place, and time. She appears well-developed and well-nourished.  HENT:  Head: Normocephalic and atraumatic.  Neck: Normal range of motion. Neck supple.  Cardiovascular: Normal rate and regular rhythm.   Pulmonary/Chest: Effort normal and breath sounds normal.  Musculoskeletal:  Normal Muscle Bulk and Muscle Testing Reveals: Upper Extremities: Right: Full ROM and Muscle Strength 5/5 Left: Decreased ROM 90 Degrees and Muscle Strength 3/5 Lower Extremities: Full ROM and Muscle Strength 5/5 Arises from Table slowly  Antalgic Gait  Neurological: She is alert and oriented to person, place, and time.  Skin: Skin is warm and dry.  Psychiatric: She has a normal mood and affect.  Nursing note and vitals reviewed.         Assessment & Plan:  1.Complex Regional Pain Syndrome Type 1: Continue Topamax and Cymbalta07/19/2018 Refilled:Hydro-codone 7.5/325mg  one tablet twice a day one in the morning and one at bedtime#60. Continue:Tramadol 100 mg ER  daily #30 and Tramadol 50 mg one tablet by mouth BID. One tablet in the afternoon and one tablet in the evening. We will continue the opioid monitoring program, this consists of regular clinic visits, examinations, urine drug screen, pill counts as well as use of [place] Controlled Substance Reporting System. 2. Depression: Continue Cymbalta. 05/05/2017 3. Insomnia: ContinueTrazodone50 mg HS. 05/05/2017 4. Muscle Spasms: Continue Tizanidine.05/05/2017  20 minutes of face to face patient care time was spent during this visit. All questions were encouraged and answered.  F/U in 1 month

## 2017-05-05 NOTE — Patient Instructions (Signed)

## 2017-05-16 ENCOUNTER — Telehealth: Payer: Self-pay | Admitting: Physical Medicine & Rehabilitation

## 2017-05-16 NOTE — Telephone Encounter (Signed)
RECEIVED DOCUMENTS FROM [facility] - DR Letta Pate WOULD LIKE TO REVIEW IN PATIENTS NEXT APPT - CALLED LEFT VOICEMAIL FOR PATIENT OF APPT CHANGE AND REASON - SCANNING DOCUMENT TO MEDIA FILE

## 2017-05-17 ENCOUNTER — Other Ambulatory Visit: Payer: Self-pay

## 2017-05-17 ENCOUNTER — Other Ambulatory Visit: Payer: Self-pay | Admitting: General Surgery

## 2017-05-17 DIAGNOSIS — Z1231 Encounter for screening mammogram for malignant neoplasm of breast: Secondary | ICD-10-CM

## 2017-05-19 ENCOUNTER — Telehealth: Payer: Self-pay | Admitting: Internal Medicine

## 2017-05-19 ENCOUNTER — Other Ambulatory Visit (INDEPENDENT_AMBULATORY_CARE_PROVIDER_SITE_OTHER): Payer: Medicare HMO

## 2017-05-19 ENCOUNTER — Other Ambulatory Visit: Payer: Self-pay | Admitting: Internal Medicine

## 2017-05-19 DIAGNOSIS — E876 Hypokalemia: Secondary | ICD-10-CM

## 2017-05-19 DIAGNOSIS — R5383 Other fatigue: Secondary | ICD-10-CM | POA: Diagnosis not present

## 2017-05-19 LAB — VITAMIN D 25 HYDROXY (VIT D DEFICIENCY, FRACTURES): VITD: 23.43 ng/mL — ABNORMAL LOW (ref 30.00–100.00)

## 2017-05-19 LAB — POTASSIUM: Potassium: 3.2 mEq/L — ABNORMAL LOW (ref 3.5–5.1)

## 2017-05-19 LAB — VITAMIN B12: VITAMIN B 12: 266 pg/mL (ref 211–911)

## 2017-05-19 NOTE — Telephone Encounter (Signed)
Tests added

## 2017-05-19 NOTE — Telephone Encounter (Signed)
Pt came in and asked for vit D and B12 to be added to her labs today.

## 2017-05-19 NOTE — Telephone Encounter (Signed)
Okay to add those Not sure diagnoses will cover them though--?fatigue

## 2017-05-19 NOTE — Telephone Encounter (Signed)
Left message to call office. There is not once single panel that checks all vitamins. I can send the message to Dr Alphonsus SiasLetvak since he knows he history to see if he knows of any that need to be done.

## 2017-05-19 NOTE — Telephone Encounter (Signed)
Pt called requesting orders be added for labs today. She's wanting orders for major vitamins to be added if possible.

## 2017-05-20 ENCOUNTER — Other Ambulatory Visit: Payer: Self-pay

## 2017-05-20 MED ORDER — POTASSIUM CHLORIDE CRYS ER 20 MEQ PO TBCR: 20.0000 meq | EXTENDED_RELEASE_TABLET | Freq: Two times a day (BID) | ORAL | 1 refills | Status: DC

## 2017-05-20 NOTE — Telephone Encounter (Signed)
Per lab results, new rx for potassium sent in

## 2017-05-23 DIAGNOSIS — A048 Other specified bacterial intestinal infections: Secondary | ICD-10-CM | POA: Diagnosis not present

## 2017-05-23 DIAGNOSIS — Z01818 Encounter for other preprocedural examination: Secondary | ICD-10-CM | POA: Diagnosis not present

## 2017-05-23 DIAGNOSIS — K648 Other hemorrhoids: Secondary | ICD-10-CM | POA: Diagnosis not present

## 2017-05-23 DIAGNOSIS — K635 Polyp of colon: Secondary | ICD-10-CM | POA: Diagnosis not present

## 2017-05-23 DIAGNOSIS — K259 Gastric ulcer, unspecified as acute or chronic, without hemorrhage or perforation: Secondary | ICD-10-CM | POA: Diagnosis not present

## 2017-05-27 ENCOUNTER — Ambulatory Visit
Admission: RE | Admit: 2017-05-27 | Discharge: 2017-05-27 | Disposition: A | Discharge status: Home / Self Care | Payer: Medicare Other | Source: Ambulatory Visit | Attending: General Surgery | Admitting: General Surgery

## 2017-05-27 DIAGNOSIS — Z1231 Encounter for screening mammogram for malignant neoplasm of breast: Secondary | ICD-10-CM | POA: Diagnosis not present

## 2017-05-29 DIAGNOSIS — K5281 Eosinophilic gastritis or gastroenteritis: Secondary | ICD-10-CM | POA: Diagnosis not present

## 2017-05-29 DIAGNOSIS — K2970 Gastritis, unspecified, without bleeding: Secondary | ICD-10-CM | POA: Diagnosis not present

## 2017-05-29 DIAGNOSIS — K900 Celiac disease: Secondary | ICD-10-CM | POA: Diagnosis not present

## 2017-05-29 DIAGNOSIS — A048 Other specified bacterial intestinal infections: Secondary | ICD-10-CM | POA: Diagnosis not present

## 2017-05-30 DIAGNOSIS — K635 Polyp of colon: Secondary | ICD-10-CM | POA: Diagnosis not present

## 2017-06-04 DIAGNOSIS — K5281 Eosinophilic gastritis or gastroenteritis: Secondary | ICD-10-CM | POA: Diagnosis not present

## 2017-06-08 ENCOUNTER — Ambulatory Visit: Payer: Self-pay | Admitting: Registered Nurse

## 2017-06-08 ENCOUNTER — Other Ambulatory Visit: Payer: Medicare Other

## 2017-06-09 ENCOUNTER — Ambulatory Visit (HOSPITAL_BASED_OUTPATIENT_CLINIC_OR_DEPARTMENT_OTHER): Payer: Worker's Compensation | Admitting: Physical Medicine & Rehabilitation

## 2017-06-09 ENCOUNTER — Encounter: Payer: Worker's Compensation | Attending: Physical Medicine & Rehabilitation

## 2017-06-09 ENCOUNTER — Encounter: Payer: Self-pay | Admitting: Physical Medicine & Rehabilitation

## 2017-06-09 VITALS — BP 113/74 | HR 87

## 2017-06-09 DIAGNOSIS — Z90710 Acquired absence of both cervix and uterus: Secondary | ICD-10-CM | POA: Diagnosis not present

## 2017-06-09 DIAGNOSIS — G90512 Complex regional pain syndrome I of left upper limb: Secondary | ICD-10-CM | POA: Insufficient documentation

## 2017-06-09 DIAGNOSIS — G4730 Sleep apnea, unspecified: Secondary | ICD-10-CM | POA: Diagnosis not present

## 2017-06-09 DIAGNOSIS — Z803 Family history of malignant neoplasm of breast: Secondary | ICD-10-CM | POA: Insufficient documentation

## 2017-06-09 DIAGNOSIS — F419 Anxiety disorder, unspecified: Secondary | ICD-10-CM | POA: Insufficient documentation

## 2017-06-09 DIAGNOSIS — Z800 Family history of malignant neoplasm of digestive organs: Secondary | ICD-10-CM | POA: Insufficient documentation

## 2017-06-09 DIAGNOSIS — Z5181 Encounter for therapeutic drug level monitoring: Secondary | ICD-10-CM

## 2017-06-09 DIAGNOSIS — G894 Chronic pain syndrome: Secondary | ICD-10-CM | POA: Diagnosis not present

## 2017-06-09 DIAGNOSIS — Z8249 Family history of ischemic heart disease and other diseases of the circulatory system: Secondary | ICD-10-CM | POA: Insufficient documentation

## 2017-06-09 DIAGNOSIS — J45909 Unspecified asthma, uncomplicated: Secondary | ICD-10-CM | POA: Diagnosis not present

## 2017-06-09 DIAGNOSIS — Z801 Family history of malignant neoplasm of trachea, bronchus and lung: Secondary | ICD-10-CM | POA: Diagnosis not present

## 2017-06-09 DIAGNOSIS — I10 Essential (primary) hypertension: Secondary | ICD-10-CM | POA: Insufficient documentation

## 2017-06-09 DIAGNOSIS — K219 Gastro-esophageal reflux disease without esophagitis: Secondary | ICD-10-CM | POA: Insufficient documentation

## 2017-06-09 DIAGNOSIS — G9050 Complex regional pain syndrome I, unspecified: Secondary | ICD-10-CM | POA: Diagnosis not present

## 2017-06-09 DIAGNOSIS — Z79899 Other long term (current) drug therapy: Secondary | ICD-10-CM

## 2017-06-09 DIAGNOSIS — Z9889 Other specified postprocedural states: Secondary | ICD-10-CM | POA: Insufficient documentation

## 2017-06-09 DIAGNOSIS — E669 Obesity, unspecified: Secondary | ICD-10-CM | POA: Insufficient documentation

## 2017-06-09 MED ORDER — HYDROCODONE-ACETAMINOPHEN 7.5-325 MG PO TABS: ORAL_TABLET | ORAL | 0 refills | Status: DC

## 2017-06-09 MED ORDER — IBUPROFEN 600 MG PO TABS: 600.0000 mg | ORAL_TABLET | Freq: Two times a day (BID) | ORAL | 8 refills | Status: DC | PRN

## 2017-06-09 NOTE — Progress Notes (Signed)
Subjective:    Patient ID: [name], female    DOB: 1965-11-03, 51 y.o.   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 She still has Worker's Comp. coverage for her left upper limb. She has no right upper limb symptomatology. She also has new private insurance for her other health care needs   She remains stable on her current pain medication regimen, we discussed her medications including her nonsteroidal anti-inflammatory, which she takes on a fairly chronic basis. She does not take this 3 times a day but more as needed, but definitely takes at least 2 and sometimes 3 tablets per day. She is open to reducing this and eliminating this. After discussing potential side effects such as gastric ulceration and kidney failure Pain Inventory Average Pain 8 Pain Right Now 8 My pain is constant, sharp, burning, stabbing, tingling and aching  In the last 24 hours, has pain interfered with the following? General activity 8 Relation with others 8 Enjoyment of life 8 What TIME of day is your pain at its worst? all Sleep (in general) Poor  Pain is worse with: walking, bending, sitting and standing Pain improves with: rest Relief from Meds: 7  Mobility walk without assistance  Function disabled: date disabled 1994 I need assistance with the following:  meal prep, household duties and shopping  Neuro/Psych weakness numbness tremor tingling trouble walking spasms dizziness depression anxiety  Prior Studies Any changes since last visit?  no  Physicians involved in your care Any changes since last visit?  no   Family History  Problem Relation Age of Onset  . Cancer Mother        lung  . Hypertension Mother   . Cancer Father        colon  . Hypertension Sister   . Cancer Sister   . Cancer Brother   . Hyperlipidemia Sister   . Birth defects Maternal Grandmother        breast  . Breast cancer Maternal  Grandmother   . Birth defects Paternal Grandmother        uterine, stomach, lung  . Breast cancer Paternal Grandmother    Social History   Social History  . Marital status: Married    Spouse name: N/A  . Number of children: 2  . Years of education: N/A   Occupational History  . Disabled due to RSD    Social History Main Topics  . Smoking status: Never Smoker  . Smokeless tobacco: Never Used  . Alcohol use 0.0 oz/week     Comment: 01/24/2013 "drink or 2 once or /twice/year, if that"  . Drug use: No  . Sexual activity: Not Currently   Other Topics Concern  . Not on file   Social History Narrative   No living will   Would want husband as POA--then sister, Neoma Laming   Would accept resuscitation attempts   Probably would not want tube feeds if cognitively unaware   Past Surgical History:  Procedure Laterality Date  . APPENDECTOMY  ~ 06/2007  . BLADDER REPAIR  ~ 06/2007   "same day after bladder lift" (01/24/2013)  . BLADDER SUSPENSION  ~ 06/2007  . BREAST BIOPSY Left   . DIAGNOSTIC LAPAROSCOPY  1990's & ~ 2000   "I've had a couple; for endometrosis" (01/24/2013)  . HERNIA REPAIR    . INCISIONAL HERNIA REPAIR N/A 01/24/2013   Procedure: LAPAROSCOPIC INCISIONAL HERNIA;  Surgeon: Nathaneil Canary  Lynann Beaver, MD;  Location: [place];  Service: General;  Laterality: N/A;  . INSERTION OF MESH N/A 01/24/2013   Procedure: INSERTION OF MESH;  Surgeon: Harl Bowie, MD;  Location: [place];  Service: General;  Laterality: N/A;  . LAPAROSCOPIC INCISIONAL / UMBILICAL / [place]  01/24/2013   IHR w/mesh/notes  . NASAL SEPTUM SURGERY  1980's?  . OOPHORECTOMY Right 2009  . TONSILLECTOMY  1990's  . VAGINAL HYSTERECTOMY  ` 06/2007   Past Medical History:  Diagnosis Date  . Allergy   . Anxiety   . Arthritis    "just the norm" (01/24/2013)  . ASCVD (arteriosclerotic cardiovascular disease)   . Asthma   . Daily headache    "depends on the season" (01/24/2013)  . Fibromyalgia   . GERD  (gastroesophageal reflux disease)   . History of colonic polyps   . Hyperlipidemia   . Hypertension   . Migraines   . Obesity (BMI 30.0-34.9)   . RSD (reflex sympathetic dystrophy)   . Sleep apnea    NO MACHINE RECOMMENDED  . TIA (transient ischemic attack) ~ 2009   There were no vitals taken for this visit.  Opioid Risk Score:   Fall Risk Score:  `1  Depression screen PHQ 2/9  Depression screen [facility] 2/9 04/04/2017 10/21/2016 11/26/2015 06/25/2015  Decreased Interest 3 3 3 3   Down, Depressed, Hopeless 2 2 2 2   PHQ - 2 Score 5 5 5 5   Altered sleeping - - - 3  Tired, decreased energy - - - 3  Change in appetite - - - 2  Feeling bad or failure about yourself  - - - 3  Trouble concentrating - - - 2  Moving slowly or fidgety/restless - - - 0  Suicidal thoughts - - - 1  PHQ-9 Score - - - 19  Difficult doing work/chores - - - Somewhat difficult  Some recent data might be hidden     Review of Systems  Constitutional: Negative.   HENT: Negative.   Eyes: Negative.   Respiratory: Negative.   Cardiovascular: Negative.   Gastrointestinal: Negative.   Endocrine: Negative.   Genitourinary: Negative.   Musculoskeletal: Negative.   Skin: Negative.   Allergic/Immunologic: Negative.   Neurological: Negative.   Hematological: Negative.   Psychiatric/Behavioral: Negative.   All other systems reviewed and are negative.      Objective:   Physical Exam  Constitutional: She is oriented to person, place, and time. She appears well-developed and well-nourished.  HENT:  Head: Normocephalic and atraumatic.  Eyes: Pupils are equal, round, and reactive to light. Conjunctivae and EOM are normal.  Pulmonary/Chest: Breath sounds normal.  Musculoskeletal:       Right shoulder: Normal.       Left shoulder: She exhibits decreased range of motion, tenderness and decreased strength. She exhibits no deformity.       Cervical back: Normal. She exhibits normal range of motion and no tenderness.    Neurological: She is alert and oriented to person, place, and time. Gait normal.  Motor strength is 4/5 in the left deltoid, biceps, triceps, finger flexors and extensors limited by pain. Right upper extremity is 5/5 in the deltoid by stress, grip, hip flexion, knee extensors, ankle dorsiflexor    Psychiatric: She has a normal mood and affect.  Nursing note and vitals reviewed.          Assessment & Plan:  1.  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 Onset 1994 while working in [place]. Continue hydrocodone 7.5 milligrams twice a day Cymbalta 90 mg per day. Reduce Ibuprofen 600 mg 2 times a day Lidoderm patch up to 3 daily. Tizanidine 4 mg 4 times a day Topamax 100 mg every morning and 200 mg every afternoon Tramadol 50 g twice a day Tramadol ER 100 mg daily Trazodone 50 daily at bedtime  2. Lower extremity hypersensitive feet, no alternative explanation, suspect this is a spread of her RSD as well. However, this is not determined to be compensable under the [place] workers comp system. She may benefit from treatment outside workers comp system. Consider [employer] system  Nurse practitioner. Follow-up in one month

## 2017-06-09 NOTE — Patient Instructions (Signed)
[employer] [facility]

## 2017-06-10 ENCOUNTER — Other Ambulatory Visit (INDEPENDENT_AMBULATORY_CARE_PROVIDER_SITE_OTHER): Payer: Medicare HMO

## 2017-06-10 DIAGNOSIS — E876 Hypokalemia: Secondary | ICD-10-CM | POA: Diagnosis not present

## 2017-06-10 LAB — POTASSIUM: POTASSIUM: 3.1 meq/L — AB (ref 3.5–5.1)

## 2017-06-11 ENCOUNTER — Other Ambulatory Visit: Payer: Self-pay | Admitting: Internal Medicine

## 2017-06-11 DIAGNOSIS — E876 Hypokalemia: Secondary | ICD-10-CM

## 2017-06-13 ENCOUNTER — Other Ambulatory Visit: Payer: Self-pay

## 2017-06-13 MED ORDER — LOSARTAN POTASSIUM 100 MG PO TABS: 100.0000 mg | ORAL_TABLET | Freq: Every day | ORAL | 3 refills | Status: DC

## 2017-06-13 NOTE — Telephone Encounter (Signed)
Spoke to pt about labs. Starting losartan regular without hctz

## 2017-06-14 DIAGNOSIS — K259 Gastric ulcer, unspecified as acute or chronic, without hemorrhage or perforation: Secondary | ICD-10-CM | POA: Diagnosis not present

## 2017-06-14 DIAGNOSIS — Z86010 Personal history of colonic polyps: Secondary | ICD-10-CM | POA: Diagnosis not present

## 2017-06-14 DIAGNOSIS — K625 Hemorrhage of anus and rectum: Secondary | ICD-10-CM | POA: Diagnosis not present

## 2017-06-14 DIAGNOSIS — Z800 Family history of malignant neoplasm of digestive organs: Secondary | ICD-10-CM | POA: Diagnosis not present

## 2017-06-17 LAB — DRUG TOX MONITOR 1 W/CONF, ORAL FLD
AMPHETAMINES: NEGATIVE ng/mL (ref <10)
BARBITURATES: NEGATIVE ng/mL (ref <10)
Benzodiazepines: NEGATIVE ng/mL (ref <0.50)
Buprenorphine: NEGATIVE ng/mL (ref <0.025)
COCAINE: NEGATIVE ng/mL (ref <2.5)
Codeine: NEGATIVE ng/mL (ref <2.5)
Dihydrocodeine: NEGATIVE ng/mL (ref <2.5)
Fentanyl: NEGATIVE ng/mL (ref <0.10)
HEROIN METABOLITE: NEGATIVE ng/mL (ref <1.0)
HYDROCODONE: 60.3 ng/mL — AB (ref <2.5)
HYDROMORPHONE: NEGATIVE ng/mL (ref <2.5)
MDMA: NEGATIVE ng/mL (ref <10)
MEPERIDINE: NEGATIVE ng/mL (ref <5.0)
Marijuana: NEGATIVE ng/mL (ref <2.5)
Meprobamate: NEGATIVE ng/mL (ref <2.5)
Methadone: NEGATIVE ng/mL (ref <5.0)
Morphine: NEGATIVE ng/mL (ref <2.5)
NICOTINE METABOLITE: NEGATIVE ng/mL (ref <5.0)
NORHYDROCODONE: 4.1 ng/mL — AB (ref <2.5)
NOROXYCODONE: NEGATIVE ng/mL (ref <2.5)
OXYCODONE: NEGATIVE ng/mL (ref <2.5)
Opiates: POSITIVE ng/mL — AB (ref <2.5)
Oxymorphone: NEGATIVE ng/mL (ref <2.5)
Phencyclidine: NEGATIVE ng/mL (ref <10)
Propoxyphene: NEGATIVE ng/mL (ref <5.0)
TAPENTADOL: NEGATIVE ng/mL (ref <5.0)
TRAMADOL: POSITIVE ng/mL — AB (ref <5.0)
ZOLPIDEM: NEGATIVE ng/mL (ref <5.0)

## 2017-06-17 LAB — DRUG TOX ALC METAB W/CON, ORAL FLD: Alcohol Metabolite: NEGATIVE ng/mL (ref <25)

## 2017-06-21 ENCOUNTER — Telehealth: Payer: Self-pay | Admitting: *Deleted

## 2017-06-21 NOTE — Telephone Encounter (Signed)
Oral swab drug screen was consistent for prescribed medications.  ?

## 2017-06-23 ENCOUNTER — Telehealth: Payer: Self-pay | Admitting: *Deleted

## 2017-06-23 NOTE — Telephone Encounter (Signed)
[name] called and she got all but her lidocaine patches and her nexium from [employer] after letter sent (again) to her attorney.  They are requiring something written as to why she is being prescribed these two medications. Please advise.

## 2017-06-23 NOTE — Telephone Encounter (Signed)
See's medications are all being denied by her case manager with workman's comp.  She is asking we send the letter (updated) to her attorney.  VE#[phone] [place] attn Margreta Journey. Letter printed and faxed to attny.

## 2017-06-24 ENCOUNTER — Encounter: Payer: Self-pay | Admitting: Physical Medicine & Rehabilitation

## 2017-06-24 NOTE — Telephone Encounter (Signed)
Signed on p printer

## 2017-06-27 ENCOUNTER — Other Ambulatory Visit: Payer: Self-pay | Admitting: Physical Medicine & Rehabilitation

## 2017-06-27 NOTE — Telephone Encounter (Signed)
Faxed to attorney's office. Corinna notified.

## 2017-07-01 ENCOUNTER — Other Ambulatory Visit: Payer: Medicare HMO

## 2017-07-01 ENCOUNTER — Other Ambulatory Visit (INDEPENDENT_AMBULATORY_CARE_PROVIDER_SITE_OTHER): Payer: Medicare HMO

## 2017-07-01 ENCOUNTER — Telehealth: Payer: Self-pay

## 2017-07-01 DIAGNOSIS — E876 Hypokalemia: Secondary | ICD-10-CM

## 2017-07-01 LAB — RENAL FUNCTION PANEL
Albumin: 4.5 g/dL (ref 3.6–5.1)
BUN: 13 mg/dL (ref 7–25)
CO2: 23 mmol/L (ref 20–32)
Calcium: 9.4 mg/dL (ref 8.6–10.4)
Chloride: 109 mmol/L (ref 98–110)
Creat: 1 mg/dL (ref 0.50–1.05)
Glucose, Bld: 101 mg/dL — ABNORMAL HIGH (ref 65–99)
Phosphorus: 3.5 mg/dL (ref 2.5–4.5)
Potassium: 3.9 mmol/L (ref 3.5–5.3)
Sodium: 141 mmol/L (ref 135–146)

## 2017-07-01 NOTE — Telephone Encounter (Signed)
Pt came in to have labs and wanted to talk to me about her BP. She said since going off of the HCTZ, she has noticed her BP elevating high 130s/90s into the 140s/90s. I checked it here and it was 138/88. She has also been having more frequent headaches. She is wondering what she should do.

## 2017-07-01 NOTE — Telephone Encounter (Signed)
Let her know that headaches are not common from slightly elevated BP but it is possible. If this persists, I will add another BP med

## 2017-07-01 NOTE — Addendum Note (Signed)
Addended by: Alvina Chou on: 07/01/2017 11:32 AM   Modules accepted: Orders

## 2017-07-04 NOTE — Telephone Encounter (Signed)
Spoke to pt and advised per Dr Letvak 

## 2017-07-04 NOTE — Telephone Encounter (Signed)
Spoke to pt. She is asking how much potassium she need to continue taking, if any.

## 2017-07-04 NOTE — Telephone Encounter (Signed)
She can probably stop the potassium since it has normalized and she is off the HCTZ

## 2017-07-07 ENCOUNTER — Encounter: Payer: Worker's Compensation | Attending: Registered Nurse | Admitting: Registered Nurse

## 2017-07-07 ENCOUNTER — Telehealth: Payer: Self-pay | Admitting: Registered Nurse

## 2017-07-07 ENCOUNTER — Encounter: Payer: Self-pay | Admitting: Registered Nurse

## 2017-07-07 VITALS — BP 144/86 | HR 80 | Resp 14

## 2017-07-07 DIAGNOSIS — I10 Essential (primary) hypertension: Secondary | ICD-10-CM | POA: Diagnosis not present

## 2017-07-07 DIAGNOSIS — G90512 Complex regional pain syndrome I of left upper limb: Secondary | ICD-10-CM | POA: Insufficient documentation

## 2017-07-07 DIAGNOSIS — Z760 Encounter for issue of repeat prescription: Secondary | ICD-10-CM | POA: Diagnosis not present

## 2017-07-07 DIAGNOSIS — G894 Chronic pain syndrome: Secondary | ICD-10-CM | POA: Diagnosis not present

## 2017-07-07 DIAGNOSIS — F419 Anxiety disorder, unspecified: Secondary | ICD-10-CM | POA: Diagnosis not present

## 2017-07-07 DIAGNOSIS — K219 Gastro-esophageal reflux disease without esophagitis: Secondary | ICD-10-CM | POA: Diagnosis not present

## 2017-07-07 DIAGNOSIS — G4700 Insomnia, unspecified: Secondary | ICD-10-CM | POA: Insufficient documentation

## 2017-07-07 DIAGNOSIS — Z8673 Personal history of transient ischemic attack (TIA), and cerebral infarction without residual deficits: Secondary | ICD-10-CM | POA: Insufficient documentation

## 2017-07-07 DIAGNOSIS — Z79899 Other long term (current) drug therapy: Secondary | ICD-10-CM | POA: Diagnosis not present

## 2017-07-07 DIAGNOSIS — F329 Major depressive disorder, single episode, unspecified: Secondary | ICD-10-CM | POA: Insufficient documentation

## 2017-07-07 DIAGNOSIS — M62838 Other muscle spasm: Secondary | ICD-10-CM | POA: Insufficient documentation

## 2017-07-07 DIAGNOSIS — E785 Hyperlipidemia, unspecified: Secondary | ICD-10-CM | POA: Diagnosis not present

## 2017-07-07 DIAGNOSIS — M797 Fibromyalgia: Secondary | ICD-10-CM | POA: Insufficient documentation

## 2017-07-07 DIAGNOSIS — Z5181 Encounter for therapeutic drug level monitoring: Secondary | ICD-10-CM

## 2017-07-07 DIAGNOSIS — F3289 Other specified depressive episodes: Secondary | ICD-10-CM | POA: Diagnosis not present

## 2017-07-07 DIAGNOSIS — G8929 Other chronic pain: Secondary | ICD-10-CM | POA: Diagnosis present

## 2017-07-07 MED ORDER — HYDROCODONE-ACETAMINOPHEN 7.5-325 MG PO TABS: ORAL_TABLET | ORAL | 0 refills | Status: DC

## 2017-07-07 MED ORDER — TRAMADOL HCL 50 MG PO TABS: ORAL_TABLET | ORAL | 2 refills | Status: DC

## 2017-07-07 MED ORDER — TRAMADOL HCL ER 100 MG PO TB24: 100.0000 mg | ORAL_TABLET | Freq: Every day | ORAL | 2 refills | Status: DC

## 2017-07-07 NOTE — Telephone Encounter (Signed)
On 07/07/2017 the  [place] was reviewed no conflict was seen on the [facility] Controlled Substance Reporting System with multiple prescribers. [name] has a signed narcotic contract with our office. If there were any discrepancies this would have been reported to her physician.

## 2017-07-07 NOTE — Progress Notes (Signed)
Subjective:    Patient ID: [name], female    DOB: 20-Oct-1965, 51 y.o.   MRN: [id number]  HPI: [name] is a 51 year old female who returns for follow up appointmentfor chronic pain and medication refill. She states her pain is located in her left arm also complaining of generalized pain all over. She rates her pain 8. Her current exercise regime is walking short distances in her home.  Oral Swab was performed on 06/09/2017, it was consistent.    Pain Inventory Average Pain 8 Pain Right Now 8 My pain is constant, sharp, burning, stabbing, tingling and aching  In the last 24 hours, has pain interfered with the following? General activity 7 Relation with others 7 Enjoyment of life 7 What TIME of day is your pain at its worst? All Sleep (in general) Poor  Pain is worse with: walking, bending, sitting, inactivity, standing and some activites Pain improves with: rest, therapy/exercise, pacing activities, medication, TENS and injections Relief from Meds: 6  Mobility ability to climb steps?  yes do you drive?  yes  Function disabled: date disabled 09/25/13  Neuro/Psych weakness  Prior Studies Any changes since last visit?  no  Physicians involved in your care Any changes since last visit?  no   Family History  Problem Relation Age of Onset  . Cancer Mother        lung  . Hypertension Mother   . Cancer Father        colon  . Hypertension Sister   . Cancer Sister   . Cancer Brother   . Hyperlipidemia Sister   . Birth defects Maternal Grandmother        breast  . Breast cancer Maternal Grandmother   . Birth defects Paternal Grandmother        uterine, stomach, lung  . Breast cancer Paternal Grandmother    Social History   Social History  . Marital status: Married    Spouse name: N/A  . Number of children: 2  . Years of education: N/A   Occupational History  . Disabled due to RSD    Social History Main Topics  . Smoking status:  Never Smoker  . Smokeless tobacco: Never Used  . Alcohol use 0.0 oz/week     Comment: 01/24/2013 "drink or 2 once or /twice/year, if that"  . Drug use: No  . Sexual activity: Not Currently   Other Topics Concern  . None   Social History Narrative   No living will   Would want husband as POA--then sister, [name]   Would accept resuscitation attempts   Probably would not want tube feeds if cognitively unaware   Past Surgical History:  Procedure Laterality Date  . APPENDECTOMY  ~ 06/2007  . BLADDER REPAIR  ~ 06/2007   "same day after bladder lift" (01/24/2013)  . BLADDER SUSPENSION  ~ 06/2007  . BREAST BIOPSY Left   . DIAGNOSTIC LAPAROSCOPY  1990's & ~ 2000   "I've had a couple; for endometrosis" (01/24/2013)  . HERNIA REPAIR    . INCISIONAL HERNIA REPAIR N/A 01/24/2013   Procedure: LAPAROSCOPIC INCISIONAL HERNIA;  Surgeon: Shelly Rubenstein, MD;  Location: [facility] OR;  Service: General;  Laterality: N/A;  . INSERTION OF MESH N/A 01/24/2013   Procedure: INSERTION OF MESH;  Surgeon: Shelly Rubenstein, MD;  Location: [facility] OR;  Service: General;  Laterality: N/A;  . LAPAROSCOPIC INCISIONAL / UMBILICAL / VENTRAL HERNIA REPAIR  01/24/2013   IHR w/mesh/notes  .  NASAL SEPTUM SURGERY  1980's?  . OOPHORECTOMY Right 2009  . TONSILLECTOMY  1990's  . VAGINAL HYSTERECTOMY  ` 06/2007   Past Medical History:  Diagnosis Date  . Allergy   . Anxiety   . Arthritis    "just the norm" (01/24/2013)  . ASCVD (arteriosclerotic cardiovascular disease)   . Asthma   . Daily headache    "depends on the season" (01/24/2013)  . Fibromyalgia   . GERD (gastroesophageal reflux disease)   . History of colonic polyps   . Hyperlipidemia   . Hypertension   . Migraines   . Obesity (BMI 30.0-34.9)   . RSD (reflex sympathetic dystrophy)   . Sleep apnea    NO MACHINE RECOMMENDED  . TIA (transient ischemic attack) ~ 2009   BP (!) 144/86 (BP Location: Right Arm, Patient Position: Sitting, Cuff Size: Normal)   Pulse 80   Resp  14   SpO2 95%   Opioid Risk Score:   Fall Risk Score:  `1  Depression screen PHQ 2/9  Depression screen [facility] 2/9 04/04/2017 10/21/2016 11/26/2015 06/25/2015  Decreased Interest Down, Depressed, Hopeless PHQ - 2 Score Altered sleeping - - - 3  Tired, decreased energy - - - 3  Change in appetite - - - 2  Feeling bad or failure about yourself  - - - 3  Trouble concentrating - - - 2  Moving slowly or fidgety/restless - - - 0  Suicidal thoughts - - - 1  PHQ-9 Score - - - 19  Difficult doing work/chores - - - Somewhat difficult  Some recent data might be hidden     Review of Systems  HENT: Negative.   Eyes: Negative.   Cardiovascular: Negative.   Gastrointestinal: Negative.   Endocrine: Negative.   Genitourinary: Negative.   Musculoskeletal: Positive for gait problem.  Skin: Negative.   Allergic/Immunologic: Negative.   Neurological: Positive for tremors, weakness and numbness.       Tingling Dizziness Spasms   Psychiatric/Behavioral: Positive for dysphoric mood. The patient is nervous/anxious.   All other systems reviewed and are negative.      Objective:   Physical Exam  Constitutional: She is oriented to person, place, and time. She appears well-developed and well-nourished.  HENT:  Head: Normocephalic and atraumatic.  Neck: Normal range of motion. Neck supple.  Cardiovascular: Normal rate and regular rhythm.   Pulmonary/Chest: Effort normal and breath sounds normal.  Musculoskeletal:  Normal Muscle Bulk and Muscle Testing Reveals: Upper Extremities: Right: Full ROM and Muscle Strength 5/5 Left: Decreased ROM 90 Degrees and Muscle Strength 3/5 Left AC Joint Tenderness Thoracic Paraspinal Tenderness: T-3-T-6 Mainly Left Side Lumbar Paraspinal Tenderness: L-3-L-5 Lower Extremities: Decreased ROM and Muscle Strength 5/5 Bilateral Lower Extremities Flexion Produces Pain into Lower Extremities and Bilateral Feet Arises from Table slowly    Antalgic Gait  Neurological: She is alert and oriented to person, place, and time.  Skin: Skin is warm and dry.  Psychiatric: She has a normal mood and affect.  Nursing note and vitals reviewed.         Assessment & Plan:  1.Complex Regional Pain Syndrome Type 1: Continue Topamax and Cymbalta09/20/2018 Refilled:Hydro-codone 7.5/325mg  one tablet twice a day one in the morning and one at bedtime#60. Continue:Tramadol 100 mg ER daily #30 and Tramadol 50 mg one tablet by mouth BID. One tablet in the afternoon and one tablet in the evening. We will  continue the opioid monitoring program, this consists of regular clinic visits, examinations, urine drug screen, pill counts as well as use of [place] Controlled Substance Reporting System. 2. Depression: Continue Cymbalta. 07/07/2017 3. Insomnia: ContinueTrazodone50 mg HS. 07/07/2017 4. Muscle Spasms: Continue Tizanidine.07/07/2017  20 minutes of face to face patient care time was spent during this visit. All questions were encouraged and answered.  F/U in 1 month

## 2017-07-12 ENCOUNTER — Other Ambulatory Visit: Payer: Self-pay | Admitting: Registered Nurse

## 2017-07-14 ENCOUNTER — Telehealth: Payer: Self-pay | Admitting: Physical Medicine & Rehabilitation

## 2017-07-14 NOTE — Telephone Encounter (Signed)
The patient phoned to state that she needed a letter of medical necessity sent to her attorney's office. Patient stated [employer] denied coverage of her Tizanidine. The patient stated that [employer] will no longer cover the medication. Please advise.

## 2017-07-14 NOTE — Telephone Encounter (Signed)
Letter re written changing Zanaflex (brand name) to Tizanidine and faxed to attorney's office

## 2017-07-25 DIAGNOSIS — Z01818 Encounter for other preprocedural examination: Secondary | ICD-10-CM | POA: Diagnosis not present

## 2017-07-25 DIAGNOSIS — K259 Gastric ulcer, unspecified as acute or chronic, without hemorrhage or perforation: Secondary | ICD-10-CM | POA: Diagnosis not present

## 2017-07-25 DIAGNOSIS — A048 Other specified bacterial intestinal infections: Secondary | ICD-10-CM | POA: Diagnosis not present

## 2017-07-25 DIAGNOSIS — K648 Other hemorrhoids: Secondary | ICD-10-CM | POA: Diagnosis not present

## 2017-07-25 DIAGNOSIS — K625 Hemorrhage of anus and rectum: Secondary | ICD-10-CM | POA: Diagnosis not present

## 2017-07-31 DIAGNOSIS — K5281 Eosinophilic gastritis or gastroenteritis: Secondary | ICD-10-CM | POA: Diagnosis not present

## 2017-08-05 ENCOUNTER — Encounter: Payer: Worker's Compensation | Attending: Registered Nurse | Admitting: Registered Nurse

## 2017-08-05 ENCOUNTER — Encounter: Payer: Self-pay | Admitting: Registered Nurse

## 2017-08-05 VITALS — BP 135/83 | HR 70

## 2017-08-05 DIAGNOSIS — Z760 Encounter for issue of repeat prescription: Secondary | ICD-10-CM | POA: Insufficient documentation

## 2017-08-05 DIAGNOSIS — I2510 Atherosclerotic heart disease of native coronary artery without angina pectoris: Secondary | ICD-10-CM | POA: Insufficient documentation

## 2017-08-05 DIAGNOSIS — F329 Major depressive disorder, single episode, unspecified: Secondary | ICD-10-CM | POA: Insufficient documentation

## 2017-08-05 DIAGNOSIS — Z5181 Encounter for therapeutic drug level monitoring: Secondary | ICD-10-CM | POA: Diagnosis not present

## 2017-08-05 DIAGNOSIS — Z79899 Other long term (current) drug therapy: Secondary | ICD-10-CM

## 2017-08-05 DIAGNOSIS — M62838 Other muscle spasm: Secondary | ICD-10-CM

## 2017-08-05 DIAGNOSIS — K219 Gastro-esophageal reflux disease without esophagitis: Secondary | ICD-10-CM | POA: Insufficient documentation

## 2017-08-05 DIAGNOSIS — I10 Essential (primary) hypertension: Secondary | ICD-10-CM | POA: Diagnosis not present

## 2017-08-05 DIAGNOSIS — G894 Chronic pain syndrome: Secondary | ICD-10-CM

## 2017-08-05 DIAGNOSIS — G4700 Insomnia, unspecified: Secondary | ICD-10-CM | POA: Diagnosis not present

## 2017-08-05 DIAGNOSIS — Z8673 Personal history of transient ischemic attack (TIA), and cerebral infarction without residual deficits: Secondary | ICD-10-CM | POA: Insufficient documentation

## 2017-08-05 DIAGNOSIS — M797 Fibromyalgia: Secondary | ICD-10-CM | POA: Insufficient documentation

## 2017-08-05 DIAGNOSIS — F419 Anxiety disorder, unspecified: Secondary | ICD-10-CM | POA: Diagnosis not present

## 2017-08-05 DIAGNOSIS — E785 Hyperlipidemia, unspecified: Secondary | ICD-10-CM | POA: Diagnosis not present

## 2017-08-05 DIAGNOSIS — F3289 Other specified depressive episodes: Secondary | ICD-10-CM

## 2017-08-05 DIAGNOSIS — G90512 Complex regional pain syndrome I of left upper limb: Secondary | ICD-10-CM | POA: Diagnosis not present

## 2017-08-05 MED ORDER — HYDROCODONE-ACETAMINOPHEN 7.5-325 MG PO TABS: ORAL_TABLET | ORAL | 0 refills | Status: DC

## 2017-08-05 NOTE — Progress Notes (Signed)
Subjective:    Patient ID: [name], female    DOB: [birth year], 51 y.o.   MRN: [id number]  HPI: [name] is a 51 year old female who returns for follow up appointmentfor chronic pain and medication refill. She states her pain is located in her left arm also complaining of generalized pain all over. She rates her pain 10. Her current exercise regime is walking short distances in her home.  [name] Morphine equivalent is 35.00 MME.    Oral Swab was performed on 06/09/2017, it was consistent.    Pain Inventory Average Pain 10 Pain Right Now 10 My pain is constant, sharp, burning, stabbing, tingling and aching  In the last 24 hours, has pain interfered with the following? General activity 4 Relation with others 4 Enjoyment of life 4 What TIME of day is your pain at its worst? All Sleep (in general) Poor  Pain is worse with: walking, bending, sitting, inactivity and standing Pain improves with: rest, therapy/exercise, pacing activities, medication, TENS and injections Relief from Meds: 6  Mobility ability to climb steps?  yes do you drive?  yes  Function disabled: date disabled 09/25/13  Neuro/Psych bladder control problems weakness numbness tremor tingling trouble walking spasms dizziness depression anxiety loss of taste or smell  Prior Studies Any changes since last visit?  no  Physicians involved in your care Any changes since last visit?  no   Family History  Problem Relation Age of Onset  . Cancer Mother        lung  . Hypertension Mother   . Cancer Father        colon  . Hypertension Sister   . Cancer Sister   . Cancer Brother   . Hyperlipidemia Sister   . Birth defects Maternal Grandmother        breast  . Breast cancer Maternal Grandmother   . Birth defects Paternal Grandmother        uterine, stomach, lung  . Breast cancer Paternal Grandmother    Social History   Social History  . Marital status: Married   Spouse name: N/A  . Number of children: 2  . Years of education: N/A   Occupational History  . Disabled due to RSD    Social History Main Topics  . Smoking status: Never Smoker  . Smokeless tobacco: Never Used  . Alcohol use 0.0 oz/week     Comment: 01/24/2013 "drink or 2 once or /twice/year, if that"  . Drug use: No  . Sexual activity: Not Currently   Other Topics Concern  . None   Social History Narrative   No living will   Would want husband as POA--then sister, [name]   Would accept resuscitation attempts   Probably would not want tube feeds if cognitively unaware   Past Surgical History:  Procedure Laterality Date  . APPENDECTOMY  ~ 06/2007  . BLADDER REPAIR  ~ 06/2007   "same day after bladder lift" (01/24/2013)  . BLADDER SUSPENSION  ~ 06/2007  . BREAST BIOPSY Left   . DIAGNOSTIC LAPAROSCOPY  1990's & ~ 2000   "I've had a couple; for endometrosis" (01/24/2013)  . HERNIA REPAIR    . INCISIONAL HERNIA REPAIR N/A 01/24/2013   Procedure: LAPAROSCOPIC INCISIONAL HERNIA;  Surgeon: Shelly Rubenstein, MD;  Location[facility] OR;  Service: General;  Laterality: N/A;  . INSERTION OF MESH N/A 01/24/2013   Procedure: INSERTION OF MESH;  Surgeon: Shelly Rubenstein, MD;  Location: [facility] OR;  Service: General;  Laterality: N/A;  . LAPAROSCOPIC INCISIONAL / UMBILICAL / VENTRAL HERNIA REPAIR  01/24/2013   IHR w/mesh/notes  . NASAL SEPTUM SURGERY  1980's?  . OOPHORECTOMY Right 2009  . TONSILLECTOMY  1990's  . VAGINAL HYSTERECTOMY  ` 06/2007   Past Medical History:  Diagnosis Date  . Allergy   . Anxiety   . Arthritis    "just the norm" (01/24/2013)  . ASCVD (arteriosclerotic cardiovascular disease)   . Asthma   . Daily headache    "depends on the season" (01/24/2013)  . Fibromyalgia   . GERD (gastroesophageal reflux disease)   . History of colonic polyps   . Hyperlipidemia   . Hypertension   . Migraines   . Obesity (BMI 30.0-34.9)   . RSD (reflex sympathetic dystrophy)   . Sleep apnea    NO  MACHINE RECOMMENDED  . TIA (transient ischemic attack) ~ 2009   BP 135/83   Pulse 70   SpO2 95%   Opioid Risk Score:  0 Fall Risk Score:  `1  Depression screen PHQ 2/9  [facility] 08/05/2017 04/04/2017 10/21/2016 11/26/2015 06/25/2015  Decreased Interest 0 3 3 3 3   Down, Depressed, Hopeless 0 2 2 2 2   PHQ - 2 Score 0 5 5 5 5   Altered sleeping - - - - 3  Tired, decreased energy - - - - 3  Change in appetite - - - - 2  Feeling bad or failure about yourself  - - - - 3  Trouble concentrating - - - - 2  Moving slowly or fidgety/restless - - - - 0  Suicidal thoughts - - - - 1  PHQ-9 Score - - - - 19  Difficult doing work/chores - - - - Somewhat difficult  Some recent data might be hidden     Review of Systems  HENT: Negative.   Eyes: Negative.   Cardiovascular: Negative.   Gastrointestinal: Negative.   Endocrine: Negative.   Genitourinary: Negative.   Musculoskeletal: Positive for gait problem.  Skin: Negative.   Allergic/Immunologic: Negative.   Neurological: Positive for tremors, weakness and numbness.       Tingling Dizziness Spasms   Psychiatric/Behavioral: Positive for dysphoric mood. The patient is nervous/anxious.   All other systems reviewed and are negative.      Objective:   Physical Exam  Constitutional: She is oriented to person, place, and time. She appears well-developed and well-nourished.  HENT:  Head: Normocephalic and atraumatic.  Neck: Normal range of motion. Neck supple.  Cardiovascular: Normal rate and regular rhythm.   Pulmonary/Chest: Effort normal and breath sounds normal.  Musculoskeletal:  Normal Muscle Bulk and Muscle Testing Reveals: Upper Extremities: Right: Full ROM and Muscle Strength 5/5 Left: Decreased ROM 90 Degrees and Muscle Strength 3/5 Thoracic Hypersensitivity Lumbar Paraspinal Tenderness: L-3-L-5 Lower Extremities: Full ROM and Muscle Strength 5/5 Bilateral Lower Extremities Flexion Produces Pain into Lower  Extremities and Bilateral Feet Arises from Table slowly  Antalgic Gait  Neurological: She is alert and oriented to person, place, and time.  Skin: Skin is warm and dry.  Psychiatric: She has a normal mood and affect.  Nursing note and vitals reviewed.         Assessment & Plan:  1.Complex Regional Pain Syndrome Type 1: Continue Topamax and Cymbalta10/19/2018 Refilled:Hydro-codone 7.5/325mg  one tablet twice a day one in the morning and one at bedtime#60. Continue:Tramadol 100 mg ER daily #30 and Tramadol 50 mg one tablet by mouth BID. One tablet  in the afternoon and one tablet in the evening. We will continue the opioid monitoring program, this consists of regular clinic visits, examinations, urine drug screen, pill counts as well as use of [place] Controlled Substance Reporting System. 2. Depression: Continue Cymbalta. 08/05/2017 3. Insomnia: ContinueTrazodone50 mg HS. 08/05/2017 4. Muscle Spasms: Continue Tizanidine.08/05/2017  20 minutes of face to face patient care time was spent during this visit. All questions were encouraged and answered.  F/U in 1 month

## 2017-08-08 DIAGNOSIS — K625 Hemorrhage of anus and rectum: Secondary | ICD-10-CM | POA: Diagnosis not present

## 2017-08-20 ENCOUNTER — Other Ambulatory Visit: Payer: Self-pay | Admitting: Physical Medicine & Rehabilitation

## 2017-09-05 ENCOUNTER — Encounter: Payer: Worker's Compensation | Attending: Registered Nurse | Admitting: Registered Nurse

## 2017-09-05 ENCOUNTER — Encounter: Payer: Self-pay | Admitting: Registered Nurse

## 2017-09-05 ENCOUNTER — Other Ambulatory Visit: Payer: Self-pay

## 2017-09-05 VITALS — BP 131/86 | HR 69

## 2017-09-05 DIAGNOSIS — E785 Hyperlipidemia, unspecified: Secondary | ICD-10-CM | POA: Diagnosis not present

## 2017-09-05 DIAGNOSIS — Z79899 Other long term (current) drug therapy: Secondary | ICD-10-CM | POA: Diagnosis not present

## 2017-09-05 DIAGNOSIS — M797 Fibromyalgia: Secondary | ICD-10-CM | POA: Diagnosis not present

## 2017-09-05 DIAGNOSIS — F329 Major depressive disorder, single episode, unspecified: Secondary | ICD-10-CM | POA: Diagnosis not present

## 2017-09-05 DIAGNOSIS — M62838 Other muscle spasm: Secondary | ICD-10-CM | POA: Insufficient documentation

## 2017-09-05 DIAGNOSIS — E669 Obesity, unspecified: Secondary | ICD-10-CM | POA: Insufficient documentation

## 2017-09-05 DIAGNOSIS — F419 Anxiety disorder, unspecified: Secondary | ICD-10-CM | POA: Diagnosis not present

## 2017-09-05 DIAGNOSIS — G90512 Complex regional pain syndrome I of left upper limb: Secondary | ICD-10-CM | POA: Diagnosis not present

## 2017-09-05 DIAGNOSIS — Z6830 Body mass index (BMI) 30.0-30.9, adult: Secondary | ICD-10-CM | POA: Insufficient documentation

## 2017-09-05 DIAGNOSIS — G4700 Insomnia, unspecified: Secondary | ICD-10-CM | POA: Insufficient documentation

## 2017-09-05 DIAGNOSIS — G894 Chronic pain syndrome: Secondary | ICD-10-CM | POA: Diagnosis not present

## 2017-09-05 DIAGNOSIS — J45909 Unspecified asthma, uncomplicated: Secondary | ICD-10-CM | POA: Insufficient documentation

## 2017-09-05 DIAGNOSIS — I10 Essential (primary) hypertension: Secondary | ICD-10-CM | POA: Insufficient documentation

## 2017-09-05 DIAGNOSIS — I2510 Atherosclerotic heart disease of native coronary artery without angina pectoris: Secondary | ICD-10-CM | POA: Insufficient documentation

## 2017-09-05 DIAGNOSIS — Z5181 Encounter for therapeutic drug level monitoring: Secondary | ICD-10-CM | POA: Insufficient documentation

## 2017-09-05 DIAGNOSIS — K219 Gastro-esophageal reflux disease without esophagitis: Secondary | ICD-10-CM | POA: Insufficient documentation

## 2017-09-05 DIAGNOSIS — Z760 Encounter for issue of repeat prescription: Secondary | ICD-10-CM | POA: Insufficient documentation

## 2017-09-05 DIAGNOSIS — F3289 Other specified depressive episodes: Secondary | ICD-10-CM | POA: Diagnosis not present

## 2017-09-05 DIAGNOSIS — Z8673 Personal history of transient ischemic attack (TIA), and cerebral infarction without residual deficits: Secondary | ICD-10-CM | POA: Diagnosis not present

## 2017-09-05 MED ORDER — HYDROCODONE-ACETAMINOPHEN 7.5-325 MG PO TABS: ORAL_TABLET | ORAL | 0 refills | Status: DC

## 2017-09-05 MED ORDER — TRAMADOL HCL ER 100 MG PO TB24: 100.0000 mg | ORAL_TABLET | Freq: Every day | ORAL | 2 refills | Status: DC

## 2017-09-05 MED ORDER — TRAMADOL HCL 50 MG PO TABS: ORAL_TABLET | ORAL | 2 refills | Status: DC

## 2017-09-05 MED ORDER — TRAZODONE HCL 50 MG PO TABS: ORAL_TABLET | ORAL | 2 refills | Status: DC

## 2017-09-05 NOTE — Progress Notes (Signed)
Subjective:    Patient ID: [name], female    DOB: 06/25/1966, 51 y.o.   MRN: [id number]  HPI: [name] is a 51 year old female who returns for follow up appointmentfor chronic pain and medication refill. She states her pain is located in her left arm also reports having generalized pain all over. She rates her pain 7. Her current exercise regime is walking short distances in her home.  [name] Morphine equivalent is 35.00 MME.    Oral Swab was performed on 06/09/2017, it was consistent.    Pain Inventory Average Pain 7 Pain Right Now 7 My pain is constant, sharp, burning, stabbing, tingling and aching  In the last 24 hours, has pain interfered with the following? General activity 7 Relation with others 7 Enjoyment of life 7 What TIME of day is your pain at its worst? All Sleep (in general) Poor  Pain is worse with: walking, bending, sitting, inactivity and standing Pain improves with: rest, heat/ice, therapy/exercise, pacing activities, medication, TENS and injections Relief from Meds: 6  Mobility ability to climb steps?  yes do you drive?  yes  Function disabled: date disabled 09/25/13  Neuro/Psych bladder control problems weakness numbness tremor tingling trouble walking spasms dizziness depression anxiety loss of taste or smell  Prior Studies Any changes since last visit?  no  Physicians involved in your care Any changes since last visit?  no   Family History  Problem Relation Age of Onset  . Cancer Mother        lung  . Hypertension Mother   . Cancer Father        colon  . Hypertension Sister   . Cancer Sister   . Cancer Brother   . Hyperlipidemia Sister   . Birth defects Maternal Grandmother        breast  . Breast cancer Maternal Grandmother   . Birth defects Paternal Grandmother        uterine, stomach, lung  . Breast cancer Paternal Grandmother    Social History   Socioeconomic History  . Marital  status: Married    Spouse name: Not on file  . Number of children: 2  . Years of education: Not on file  . Highest education level: Not on file  Social Needs  . Financial resource strain: Not on file  . Food insecurity - worry: Not on file  . Food insecurity - inability: Not on file  . Transportation needs - medical: Not on file  . Transportation needs - non-medical: Not on file  Occupational History  . 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 Smoking status: Never Smoker  . Smokeless tobacco: Never Used  Substance and Sexual Activity  . Alcohol use: Yes    Alcohol/week: 0.0 oz    Comment: 01/24/2013 "drink or 2 once or /twice/year, if that"  . Drug use: No  . Sexual activity: Not Currently  Other Topics Concern  . Not on file  Social History Narrative   No living will   Would want husband as POA--then sister, Suzanne PoundDeborah   Would accept resuscitation attempts   Probably would not want tube feeds if cognitively unaware   Past Surgical History:  Procedure Laterality Date  . APPENDECTOMY  ~ 06/2007  . BLADDER REPAIR  ~ 06/2007   "same day after bladder lift" (01/24/2013)  . BLADDER SUSPENSION  ~ 06/2007  . BREAST BIOPSY Left   . DIAGNOSTIC LAPAROSCOPY  1990's & ~ 2000   "  I've had a couple; for endometrosis" (01/24/2013)  . HERNIA REPAIR    . INSERTION OF MESH N/A 01/24/2013   [facility] Shelly RubensteinBlackman, Douglas A[facility]  . LAPAROSCOPIC INCISIONAL / UMBILICAL / VENTRAL HERNIA REPAIR  01/24/2013   IHR w/mesh/notes  . LAPAROSCOPIC INCISIONAL HERNIA N/A 01/24/2013   Performed [facility] Shel[facility]ensteinBlackman, Douglas A[facility]  . NASAL SEPTUM SURGERY  1980's?  . OOPHORECTOMY Right 2009  . TONSILLECTOMY  1990's  . VAGINAL HYSTERECTOMY  ` 06/2007   Past Medical History:  Diagnosis Date  . Allergy   . Anxiety   . Arthritis    "just the norm" (01/24/2013)  . ASCVD (arteriosclerotic cardiovascular disease)   . Asthma   . Daily headache    "depends on the season" (01/24/2013)  . Fibromyalgia   .  GERD (gastroesophageal reflux disease)   . History of colonic polyps   . Hyperlipidemia   . Hypertension   . Migraines   . Obesity (BMI 30.0-34.9)   . RSD (reflex sympathetic dystrophy)   . Sleep apnea    NO MACHINE RECOMMENDED  . TIA (transient ischemic attack) ~ 2009   There were no vitals taken for this visit.  Opioid Risk Score:  0 Fall Risk Score:  `1  Depression screen PHQ 2/9  Dep[facility] 09/05/2017 08/05/2017 04/04/2017 10/21/2016 11/26/2015 06/25/2015  Decreased Interest 0 0 3 3 3 3   Down, Depressed, Hopeless 0 0 2 2 2 2   PHQ - 2 Score 0 0 5 5 5 5   Altered sleeping - - - - - 3  Tired, decreased energy - - - - - 3  Change in appetite - - - - - 2  Feeling bad or failure about yourself  - - - - - 3  Trouble concentrating - - - - - 2  Moving slowly or fidgety/restless - - - - - 0  Suicidal thoughts - - - - - 1  PHQ-9 Score - - - - - 19  Difficult doing work/chores - - - - - Somewhat difficult  Some recent data might be hidden     Review of Systems  HENT: Negative.   Eyes: Negative.   Cardiovascular: Negative.   Gastrointestinal: Negative.   Endocrine: Negative.   Genitourinary: Negative.   Musculoskeletal: Positive for gait problem.  Skin: Negative.   Allergic/Immunologic: Negative.   Neurological: Positive for tremors, weakness and numbness.       Tingling Dizziness Spasms   Psychiatric/Behavioral: Positive for dysphoric mood. The patient is nervous/anxious.   All other systems reviewed and are negative.      Objective:   Physical Exam  Constitutional: She is oriented to person, place, and time. She appears well-developed and well-nourished.  HENT:  Head: Normocephalic and atraumatic.  Neck: Normal range of motion. Neck supple.  Cardiovascular: Normal rate and regular rhythm.  Pulmonary/Chest: Effort normal and breath sounds normal.  Musculoskeletal:  Normal Muscle Bulk and Muscle Testing Reveals: Upper Extremities: Right: Full ROM and  Muscle Strength 5/5 Left: Decreased ROM 90 Degrees and Muscle Strength 3/5 Thoracic Hypersensitivity Lumbar Hypersensitivity Right Lower  Extremity: Full ROM and Muscle Strength 5/5 Left: Decreased ROM and Muscle Strength 4/5 Bilateral Lower Extremities Flexion Produces Pain into  Bilateral Feet Arises from Table slowly  Antalgic Gait  Neurological: She is alert and oriented to person, place, and time.  Skin: Skin is warm and dry.  Psychiatric: She has a normal mood and affect.  Nursing  note and vitals reviewed.         Assessment & Plan:  1.Complex Regional Pain Syndrome Type 1: Continue Topamax and Cymbalta11/19/2018 Refilled:Hydro-codone 7.5/325mg  one tablet twice a day one in the morning and one at bedtime#60. Continue:Tramadol 100 mg ER daily #30 and Tramadol 50 mg one tablet by mouth BID. One tablet in the afternoon and one tablet in the evening. We will continue the opioid monitoring program, this consists of regular clinic visits, examinations, urine drug screen, pill counts as well as use of [place] Controlled Substance Reporting System. 2. Depression: Continue Cymbalta. 09/05/2017 3. Insomnia: ContinueTrazodone50 mg HS. 09/05/2017 4. Muscle Spasms: Continue Tizanidine.09/05/2017  20 minutes of face to face patient care time was spent during this visit. All questions were encouraged and answered.  F/U in 1 month

## 2017-09-05 NOTE — Progress Notes (Signed)
Updated letter with latest notes from Dr Wynn BankerKirsteins and Jacalyn LefevreEunice Thomas faxed to Mrs Giaraputo's attorney's office.

## 2017-09-19 DIAGNOSIS — K625 Hemorrhage of anus and rectum: Secondary | ICD-10-CM | POA: Diagnosis not present

## 2017-09-19 DIAGNOSIS — Z01818 Encounter for other preprocedural examination: Secondary | ICD-10-CM | POA: Diagnosis not present

## 2017-09-19 DIAGNOSIS — K648 Other hemorrhoids: Secondary | ICD-10-CM | POA: Diagnosis not present

## 2017-10-04 ENCOUNTER — Encounter: Payer: Self-pay | Admitting: Registered Nurse

## 2017-10-04 ENCOUNTER — Other Ambulatory Visit: Payer: Self-pay

## 2017-10-04 ENCOUNTER — Encounter: Payer: Worker's Compensation | Attending: Registered Nurse | Admitting: Registered Nurse

## 2017-10-04 VITALS — BP 139/87 | HR 68

## 2017-10-04 DIAGNOSIS — E785 Hyperlipidemia, unspecified: Secondary | ICD-10-CM | POA: Insufficient documentation

## 2017-10-04 DIAGNOSIS — F3289 Other specified depressive episodes: Secondary | ICD-10-CM | POA: Diagnosis not present

## 2017-10-04 DIAGNOSIS — M62838 Other muscle spasm: Secondary | ICD-10-CM

## 2017-10-04 DIAGNOSIS — G90512 Complex regional pain syndrome I of left upper limb: Secondary | ICD-10-CM | POA: Diagnosis not present

## 2017-10-04 DIAGNOSIS — I2510 Atherosclerotic heart disease of native coronary artery without angina pectoris: Secondary | ICD-10-CM | POA: Diagnosis not present

## 2017-10-04 DIAGNOSIS — F329 Major depressive disorder, single episode, unspecified: Secondary | ICD-10-CM | POA: Diagnosis not present

## 2017-10-04 DIAGNOSIS — Z760 Encounter for issue of repeat prescription: Secondary | ICD-10-CM | POA: Diagnosis not present

## 2017-10-04 DIAGNOSIS — I10 Essential (primary) hypertension: Secondary | ICD-10-CM | POA: Insufficient documentation

## 2017-10-04 DIAGNOSIS — M797 Fibromyalgia: Secondary | ICD-10-CM | POA: Insufficient documentation

## 2017-10-04 DIAGNOSIS — F419 Anxiety disorder, unspecified: Secondary | ICD-10-CM | POA: Diagnosis not present

## 2017-10-04 DIAGNOSIS — K219 Gastro-esophageal reflux disease without esophagitis: Secondary | ICD-10-CM | POA: Diagnosis not present

## 2017-10-04 DIAGNOSIS — Z5181 Encounter for therapeutic drug level monitoring: Secondary | ICD-10-CM

## 2017-10-04 DIAGNOSIS — Z8673 Personal history of transient ischemic attack (TIA), and cerebral infarction without residual deficits: Secondary | ICD-10-CM | POA: Insufficient documentation

## 2017-10-04 DIAGNOSIS — G894 Chronic pain syndrome: Secondary | ICD-10-CM

## 2017-10-04 DIAGNOSIS — G4700 Insomnia, unspecified: Secondary | ICD-10-CM

## 2017-10-04 DIAGNOSIS — Z79899 Other long term (current) drug therapy: Secondary | ICD-10-CM | POA: Diagnosis not present

## 2017-10-04 MED ORDER — TIZANIDINE HCL 4 MG PO TABS: ORAL_TABLET | ORAL | 3 refills | Status: DC

## 2017-10-04 MED ORDER — HYDROCODONE-ACETAMINOPHEN 7.5-325 MG PO TABS: ORAL_TABLET | ORAL | 0 refills | Status: DC

## 2017-10-04 NOTE — Progress Notes (Addendum)
Subjective:    Patient ID: [name], female    DOB: 03/22/1966, 51 y.o.   MRN: [id number]  HPI: [name] is a 51 year old female who returns for follow up appointmentfor chronic pain and medication refill. She states her pain is located in her neck radiating into her left shoulder and left arm. She rates her pain 8. Her current exercise regime is walking short distances in her home.  [name] Morphine equivalent is 35.00 MME.    Oral Swab was performed on 06/09/2017, it was consistent.    Pain Inventory Average Pain 8 Pain Right Now 8 My pain is constant, sharp, burning, stabbing, tingling and aching  In the last 24 hours, has pain interfered with the following? General activity 8 Relation with others 8 Enjoyment of life 8 What TIME of day is your pain at its worst? All Sleep (in general) Poor  Pain is worse with: walking, bending, sitting, inactivity and standing Pain improves with: rest, heat/ice, therapy/exercise, pacing activities, medication, TENS and injections Relief from Meds: 6  Mobility ability to climb steps?  yes do you drive?  yes  Function disabled: date disabled 09/25/13 I need assistance with the following:  meal prep, household duties and shopping  Neuro/Psych bladder control problems weakness numbness tremor tingling trouble walking spasms dizziness depression anxiety  Prior Studies Any changes since last visit?  no  Physicians involved in your care Any changes since last visit?  no   Family History  Problem Relation Age of Onset  . Cancer Mother        lung  . Hypertension Mother   . Cancer Father        colon  . Hypertension Sister   . Cancer Sister   . Cancer Brother   . Hyperlipidemia Sister   . Birth defects Maternal Grandmother        breast  . Breast cancer Maternal Grandmother   . Birth defects Paternal Grandmother        uterine, stomach, lung  . Breast cancer Paternal Grandmother     Social History   Socioeconomic History  . Marital status: Married    Spouse name: None  . Number of children: 2  . Years of education: None  . Highest education level: None  Social Needs  . Financial resource strain: None  . Food insecurity - worry: None  . Food insecurity - inability: None  . Transportation needs - medical: None  . Transportation needs - non-medical: None  Occupational History  . 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 Smoking status: Never Smoker  . Smokeless tobacco: Never Used  Substance and Sexual Activity  . Alcohol use: Yes    Alcohol/week: 0.0 oz    Comment: 01/24/2013 "drink or 2 once or /twice/year, if that"  . Drug use: No  . Sexual activity: Not Currently  Other Topics Concern  . None  Social History Narrative   No living will   Would want husband as POA--then sister, Gavin PoundDeborah   Would accept resuscitation attempts   Probably would not want tube feeds if cognitively unaware   Past Surgical History:  Procedure Laterality Date  . APPENDECTOMY  ~ 06/2007  . BLADDER REPAIR  ~ 06/2007   "same day after bladder lift" (01/24/2013)  . BLADDER SUSPENSION  ~ 06/2007  . BREAST BIOPSY Left   . DIAGNOSTIC LAPAROSCOPY  1990's & ~ 2000   "I've had a couple; for endometrosis" (01/24/2013)  .  HERNIA REPAIR    . INCISIONAL HERNIA REPAIR N/A 01/24/2013   Procedure: LAPAROSCOPIC INCISIONAL HERNIA;  Surgeon: Shelly Rubensteinouglas A Blackman, MD;  [facility];  Service: General;  Laterality: N/A;  . INSERTION OF MESH N/A 01/24/2013   Procedure: INSERTION OF MESH;  Surgeon: Shelly Rubensteinouglas A Blackman, MD;  Location: [facility] OR;  Service: General;  Laterality: N/A;  . LAPAROSCOPIC INCISIONAL / UMBILICAL / VENTRAL HERNIA REPAIR  01/24/2013   IHR w/mesh/notes  . NASAL SEPTUM SURGERY  1980's?  . OOPHORECTOMY Right 2009  . TONSILLECTOMY  1990's  . VAGINAL HYSTERECTOMY  ` 06/2007   Past Medical History:  Diagnosis Date  . Allergy   . Anxiety   . Arthritis    "just the norm" (01/24/2013)   . ASCVD (arteriosclerotic cardiovascular disease)   . Asthma   . Daily headache    "depends on the season" (01/24/2013)  . Fibromyalgia   . GERD (gastroesophageal reflux disease)   . History of colonic polyps   . Hyperlipidemia   . Hypertension   . Migraines   . Obesity (BMI 30.0-34.9)   . RSD (reflex sympathetic dystrophy)   . Sleep apnea    NO MACHINE RECOMMENDED  . TIA (transient ischemic attack) ~ 2009   BP 139/87   Pulse 68   SpO2 96%   Opioid Risk Score:  0 Fall Risk Score:  `1  Depression screen PHQ 2/9  Depression [facility] 10/04/2017 09/05/2017 08/05/2017 04/04/2017 10/21/2016 11/26/2015 06/25/2015  Decreased Interest 1 0 0 3 3 3 3   Down, Depressed, Hopeless 1 0 0 2 2 2 2   PHQ - 2 Score 2 0 0 5 5 5 5   Altered sleeping - - - - - - 3  Tired, decreased energy - - - - - - 3  Change in appetite - - - - - - 2  Feeling bad or failure about yourself  - - - - - - 3  Trouble concentrating - - - - - - 2  Moving slowly or fidgety/restless - - - - - - 0  Suicidal thoughts - - - - - - 1  PHQ-9 Score - - - - - - 19  Difficult doing work/chores - - - - - - Somewhat difficult  Some recent data might be hidden     Review of Systems  HENT: Negative.   Eyes: Negative.   Cardiovascular: Negative.   Gastrointestinal: Negative.   Endocrine: Negative.   Genitourinary: Negative.   Musculoskeletal: Positive for gait problem.  Skin: Negative.   Allergic/Immunologic: Negative.   Neurological: Positive for tremors, weakness and numbness.       Tingling Dizziness Spasms   Psychiatric/Behavioral: Positive for dysphoric mood. The patient is nervous/anxious.   All other systems reviewed and are negative.      Objective:   Physical Exam  Constitutional: She is oriented to person, place, and time. She appears well-developed and well-nourished.  HENT:  Head: Normocephalic and atraumatic.  Neck: Normal range of motion. Neck supple.  Cardiovascular: Normal rate and regular  rhythm.  Pulmonary/Chest: Effort normal and breath sounds normal.  Musculoskeletal:  Normal Muscle Bulk and Muscle Testing Reveals: Upper Extremities: Right: Full ROM and Muscle Strength 5/5 Left: Decreased ROM 90 Degrees and Muscle Strength 3/5 Left AC Joint  Thoracic Hypersensitivity: T-1-T-9 Right Lower  Extremity: Full ROM and Muscle Strength 5/5 Left: Decreased ROM and Muscle Strength 4/5 Bilateral Lower Extremities Flexion Produces Pain into  Bilateral Feet Arises from Table slowly  Antalgic Gait  Neurological: She is alert and oriented to person, place, and time.  Skin: Skin is warm and dry.  Psychiatric: She has a normal mood and affect.  Nursing note and vitals reviewed.         Assessment & Plan:  1.Complex Regional Pain Syndrome Type 1: Continue Topamax and Cymbalta12/18/2018 Refilled:Hydro-codone 7.5/325mg  one tablet twice a day one in the morning and one at bedtime#60. Continue:Tramadol 100 mg ER daily #30 and Tramadol 50 mg one tablet by mouth BID. One tablet in the afternoon and one tablet in the evening. We will continue the opioid monitoring program, this consists of regular clinic visits, examinations, urine drug screen, pill counts as well as use of [place] Controlled Substance Reporting System. 2. Depression: Continue Cymbalta. 10/04/2017 3. Insomnia: ContinueTrazodone50 mg HS. 10/04/2017 4. Muscle Spasms: Continue Tizanidine.10/04/2017  20 minutes of face to face patient care time was spent during this visit. All questions were encouraged and answered.  F/U in 1 month

## 2017-10-05 ENCOUNTER — Encounter: Payer: Worker's Compensation | Admitting: Registered Nurse

## 2017-11-07 ENCOUNTER — Other Ambulatory Visit: Payer: Self-pay | Admitting: Internal Medicine

## 2017-11-08 ENCOUNTER — Encounter: Payer: Worker's Compensation | Attending: Registered Nurse | Admitting: Registered Nurse

## 2017-11-08 ENCOUNTER — Encounter: Payer: Self-pay | Admitting: Registered Nurse

## 2017-11-08 VITALS — BP 147/87 | HR 66

## 2017-11-08 DIAGNOSIS — G9050 Complex regional pain syndrome I, unspecified: Secondary | ICD-10-CM | POA: Diagnosis not present

## 2017-11-08 DIAGNOSIS — G4700 Insomnia, unspecified: Secondary | ICD-10-CM | POA: Diagnosis not present

## 2017-11-08 DIAGNOSIS — G90512 Complex regional pain syndrome I of left upper limb: Secondary | ICD-10-CM | POA: Diagnosis not present

## 2017-11-08 DIAGNOSIS — Z9889 Other specified postprocedural states: Secondary | ICD-10-CM | POA: Insufficient documentation

## 2017-11-08 DIAGNOSIS — F329 Major depressive disorder, single episode, unspecified: Secondary | ICD-10-CM | POA: Insufficient documentation

## 2017-11-08 DIAGNOSIS — Z8673 Personal history of transient ischemic attack (TIA), and cerebral infarction without residual deficits: Secondary | ICD-10-CM | POA: Insufficient documentation

## 2017-11-08 DIAGNOSIS — Z90710 Acquired absence of both cervix and uterus: Secondary | ICD-10-CM | POA: Insufficient documentation

## 2017-11-08 DIAGNOSIS — G894 Chronic pain syndrome: Secondary | ICD-10-CM | POA: Diagnosis not present

## 2017-11-08 DIAGNOSIS — E669 Obesity, unspecified: Secondary | ICD-10-CM | POA: Insufficient documentation

## 2017-11-08 DIAGNOSIS — M79602 Pain in left arm: Secondary | ICD-10-CM | POA: Insufficient documentation

## 2017-11-08 DIAGNOSIS — Z6830 Body mass index (BMI) 30.0-30.9, adult: Secondary | ICD-10-CM | POA: Insufficient documentation

## 2017-11-08 DIAGNOSIS — Z5181 Encounter for therapeutic drug level monitoring: Secondary | ICD-10-CM | POA: Diagnosis not present

## 2017-11-08 DIAGNOSIS — M62838 Other muscle spasm: Secondary | ICD-10-CM | POA: Diagnosis not present

## 2017-11-08 DIAGNOSIS — Z760 Encounter for issue of repeat prescription: Secondary | ICD-10-CM | POA: Insufficient documentation

## 2017-11-08 DIAGNOSIS — I10 Essential (primary) hypertension: Secondary | ICD-10-CM | POA: Diagnosis not present

## 2017-11-08 DIAGNOSIS — Z79899 Other long term (current) drug therapy: Secondary | ICD-10-CM | POA: Insufficient documentation

## 2017-11-08 DIAGNOSIS — F3289 Other specified depressive episodes: Secondary | ICD-10-CM

## 2017-11-08 MED ORDER — TRAMADOL HCL ER 100 MG PO TB24: 100.0000 mg | ORAL_TABLET | Freq: Every day | ORAL | 2 refills | Status: DC

## 2017-11-08 MED ORDER — HYDROCODONE-ACETAMINOPHEN 7.5-325 MG PO TABS: ORAL_TABLET | ORAL | 0 refills | Status: DC

## 2017-11-08 MED ORDER — TRAMADOL HCL 50 MG PO TABS: ORAL_TABLET | ORAL | 2 refills | Status: DC

## 2017-11-08 NOTE — Progress Notes (Signed)
Subjective:    Patient ID: [name], female    DOB: 05-31-1966, 52 y.o.   MRN: [id number]  HPI: [name] is a 52 year old female who returns for follow up appointmentfor chronic pain and medication refill. She states her pain is located in her left arm. She rates her pain 10. Her current exercise regime is walking short distances.   [name] Morphine equivalent is 33.67  MME.    Oral Swab was performed on 06/09/2017, it was consistent. Oral Swab Performed Today.    Pain Inventory Average Pain 10 Pain Right Now 10 My pain is constant, sharp, burning, stabbing, tingling and aching  In the last 24 hours, has pain interfered with the following? General activity 10 Relation with others 10 Enjoyment of life 10 What TIME of day is your pain at its worst? All Sleep (in general) Poor  Pain is worse with: walking, bending, sitting, inactivity and standing Pain improves with: rest, heat/ice, therapy/exercise, pacing activities, medication, TENS and injections Relief from Meds: 6  Mobility ability to climb steps?  yes do you drive?  yes  Function disabled: date disabled 09/25/13 I need assistance with the following:  meal prep, household duties and shopping  Neuro/Psych bladder control problems weakness numbness tremor tingling trouble walking spasms dizziness depression anxiety  Prior Studies Any changes since last visit?  no  Physicians involved in your care Any changes since last visit?  no   Family History  Problem Relation Age of Onset  . Cancer Mother        lung  . Hypertension Mother   . Cancer Father        colon  . Hypertension Sister   . Cancer Sister   . Cancer Brother   . Hyperlipidemia Sister   . Birth defects Maternal Grandmother        breast  . Breast cancer Maternal Grandmother   . Birth defects Paternal Grandmother        uterine, stomach, lung  . Breast cancer Paternal Grandmother    Social History    Socioeconomic History  . Marital status: Married    Spouse name: None  . Number of children: 2  . Years of education: None  . Highest education level: None  Social Needs  . Financial resource strain: None  . Food insecurity - worry: None  . Food insecurity - inability: None  . Transportation needs - medical: None  . Transportation needs - non-medical: None  Occupational History  . 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 Smoking status: Never Smoker  . Smokeless tobacco: Never Used  Substance and Sexual Activity  . Alcohol use: Yes    Alcohol/week: 0.0 oz    Comment: 01/24/2013 "drink or 2 once or /twice/year, if that"  . Drug use: No  . Sexual activity: Not Currently  Other Topics Concern  . None  Social History Narrative   No living will   Would want husband as POA--then sister, [name]   Would accept resuscitation attempts   Probably would not want tube feeds if cognitively unaware   Past Surgical History:  Procedure Laterality Date  . APPENDECTOMY  ~ 06/2007  . BLADDER REPAIR  ~ 06/2007   "same day after bladder lift" (01/24/2013)  . BLADDER SUSPENSION  ~ 06/2007  . BREAST BIOPSY Left   . DIAGNOSTIC LAPAROSCOPY  1990's & ~ 2000   "I've had a couple; for endometrosis" (01/24/2013)  . HERNIA REPAIR    .  INCISIONAL HERNIA REPAIR N/A 01/24/2013   Procedure: LAPAROSCOPIC INCISIONAL HERNIA;  Surgeon: Shelly Rubenstein, MD;  Location: [facility] OR;  Service: General;  Laterality: N/A;  . INSERTION OF MESH N/A 01/24/2013   Procedure: INSERTION OF MESH;  Surgeon: Shelly Rubenstein, MD;  Location: [facility] OR;  Service: General;  Laterality: N/A;  . LAPAROSCOPIC INCISIONAL / UMBILICAL / VENTRAL HERNIA REPAIR  01/24/2013   IHR w/mesh/notes  . NASAL SEPTUM SURGERY  1980's?  . OOPHORECTOMY Right 2009  . TONSILLECTOMY  1990's  . VAGINAL HYSTERECTOMY  ` 06/2007   Past Medical History:  Diagnosis Date  . Allergy   . Anxiety   . Arthritis    "just the norm" (01/24/2013)  . ASCVD  (arteriosclerotic cardiovascular disease)   . Asthma   . Daily headache    "depends on the season" (01/24/2013)  . Fibromyalgia   . GERD (gastroesophageal reflux disease)   . History of colonic polyps   . Hyperlipidemia   . Hypertension   . Migraines   . Obesity (BMI 30.0-34.9)   . RSD (reflex sympathetic dystrophy)   . Sleep apnea    NO MACHINE RECOMMENDED  . TIA (transient ischemic attack) ~ 2009   BP (!) 147/87   Pulse 66   SpO2 97%   Opioid Risk Score:  0 Fall Risk Score:  `1  Depression screen PHQ 2/9  Depression screen [facility] 2/9 10/04/2017 09/05/2017 08/05/2017 04/04/2017 10/21/2016 11/26/2015 06/25/2015  Decreased Interest 1 0 0 3 3 3 3   Down, Depressed, Hopeless 1 0 0 2 2 2 2   PHQ - 2 Score 2 0 0 5 5 5 5   Altered sleeping - - - - - - 3  Tired, decreased energy - - - - - - 3  Change in appetite - - - - - - 2  Feeling bad or failure about yourself  - - - - - - 3  Trouble concentrating - - - - - - 2  Moving slowly or fidgety/restless - - - - - - 0  Suicidal thoughts - - - - - - 1  PHQ-9 Score - - - - - - 19  Difficult doing work/chores - - - - - - Somewhat difficult  Some recent data might be hidden     Review of Systems  HENT: Negative.   Eyes: Negative.   Cardiovascular: Negative.   Gastrointestinal: Negative.   Endocrine: Negative.   Genitourinary: Negative.   Musculoskeletal: Positive for gait problem.  Skin: Negative.   Allergic/Immunologic: Negative.   Neurological: Positive for tremors, weakness and numbness.       Tingling Dizziness Spasms   Psychiatric/Behavioral: Positive for dysphoric mood. The patient is nervous/anxious.   All other systems reviewed and are negative.      Objective:   Physical Exam  Constitutional: She is oriented to person, place, and time. She appears well-developed and well-nourished.  HENT:  Head: Normocephalic and atraumatic.  Neck: Normal range of motion. Neck supple.  Cardiovascular: Normal rate and regular rhythm.    Pulmonary/Chest: Effort normal and breath sounds normal.  Musculoskeletal:  Normal Muscle Bulk and Muscle Testing Reveals: Upper Extremities: Right: Full ROM and Muscle Strength 5/5 Left: Decreased ROM 90 Degrees and Muscle Strength 3/5 Bilateral Lower  Extremities: Decreased ROM and Muscle Strength 4/5 Bilateral Lower Extremities Flexion Produces Pain into Bilateral Extremities and Bilateral Feet.  Arises from Table Slowly Antalgic Gait  Neurological: She is alert and oriented to person, place, and time.  Skin:  Skin is warm and dry.  Psychiatric: She has a normal mood and affect.  Nursing note and vitals reviewed.         Assessment & Plan:  1.Complex Regional Pain Syndrome Type 1: Continue Topamax and Cymbalta. 11/08/2016 Refilled:Hydro-codone 7.5/325mg  one tablet twice a day one in the morning and one at bedtime#60. Continue:Tramadol 100 mg ER daily #30 and Tramadol 50 mg one tablet by mouth BID. One tablet in the afternoon and one tablet in the evening. We will continue the opioid monitoring program, this consists of regular clinic visits, examinations, urine drug screen, pill counts as well as use of [place]. 2. Depression: Continue Cymbalta. 11/08/2017 3. Insomnia: ContinueTrazodone50 mg HS. 11/08/2017 4. Muscle Spasms: Continue Tizanidine. 11/08/2017  20 minutes of face to face patient care time was spent during this visit. All questions were encouraged and answered.  F/U in 1 month

## 2017-11-12 LAB — DRUG TOX MONITOR 1 W/CONF, ORAL FLD
AMPHETAMINES: NEGATIVE ng/mL (ref <10)
Alprazolam: NEGATIVE ng/mL (ref <0.50)
BARBITURATES: NEGATIVE ng/mL (ref <10)
Benzodiazepines: NEGATIVE ng/mL (ref <0.50)
Buprenorphine: NEGATIVE ng/mL (ref <0.10)
CLONAZEPAM: NEGATIVE ng/mL (ref <0.50)
Cocaine: NEGATIVE ng/mL (ref <5.0)
Codeine: NEGATIVE ng/mL (ref <2.5)
Diazepam: NEGATIVE ng/mL (ref <0.50)
Dihydrocodeine: 3.4 ng/mL — ABNORMAL HIGH (ref <2.5)
FENTANYL: NEGATIVE ng/mL (ref <0.10)
Flunitrazepam: NEGATIVE ng/mL (ref <0.50)
Flurazepam: NEGATIVE ng/mL (ref <0.50)
HYDROCODONE: 47.9 ng/mL — AB (ref <2.5)
Heroin Metabolite: NEGATIVE ng/mL (ref <1.0)
Hydromorphone: NEGATIVE ng/mL (ref <2.5)
LORAZEPAM: NEGATIVE ng/mL (ref <0.50)
MARIJUANA: NEGATIVE ng/mL (ref <2.5)
MDMA: NEGATIVE ng/mL (ref <10)
MIDAZOLAM: NEGATIVE ng/mL (ref <0.50)
MORPHINE: NEGATIVE ng/mL (ref <2.5)
Meprobamate: NEGATIVE ng/mL (ref <2.5)
Methadone: NEGATIVE ng/mL (ref <5.0)
NORHYDROCODONE: 6.7 ng/mL — AB (ref <2.5)
Nicotine Metabolite: NEGATIVE ng/mL (ref <5.0)
Nordiazepam: NEGATIVE ng/mL (ref <0.50)
Noroxycodone: NEGATIVE ng/mL (ref <2.5)
OPIATES: POSITIVE ng/mL — AB (ref <2.5)
Oxazepam: NEGATIVE ng/mL (ref <0.50)
Oxycodone: NEGATIVE ng/mL (ref <2.5)
Oxymorphone: NEGATIVE ng/mL (ref <2.5)
PHENCYCLIDINE: NEGATIVE ng/mL (ref <10)
Tapentadol: NEGATIVE ng/mL (ref <5.0)
Temazepam: NEGATIVE ng/mL (ref <0.50)
Tramadol: >500 ng/mL — ABNORMAL HIGH (ref <5.0)
Tramadol: POSITIVE ng/mL — AB (ref <5.0)
Triazolam: NEGATIVE ng/mL (ref <0.50)
Zolpidem: NEGATIVE ng/mL (ref <5.0)

## 2017-11-12 LAB — DRUG TOX ALC METAB W/CON, ORAL FLD: ALCOHOL METABOLITE: NEGATIVE ng/mL (ref <25)

## 2017-11-15 ENCOUNTER — Telehealth: Payer: Self-pay | Admitting: *Deleted

## 2017-11-15 NOTE — Telephone Encounter (Signed)
Oral swab drug screen was consistent for prescribed medications.  ?

## 2017-11-23 ENCOUNTER — Telehealth: Payer: Self-pay | Admitting: Physical Medicine & Rehabilitation

## 2017-11-23 NOTE — Telephone Encounter (Signed)
Called and left voicemail to return call about charges

## 2017-12-06 ENCOUNTER — Ambulatory Visit: Payer: Self-pay | Admitting: Physical Medicine & Rehabilitation

## 2017-12-06 ENCOUNTER — Ambulatory Visit: Payer: Self-pay

## 2017-12-12 ENCOUNTER — Ambulatory Visit (HOSPITAL_BASED_OUTPATIENT_CLINIC_OR_DEPARTMENT_OTHER): Payer: Worker's Compensation | Admitting: Physical Medicine & Rehabilitation

## 2017-12-12 ENCOUNTER — Encounter: Payer: Worker's Compensation | Attending: Registered Nurse

## 2017-12-12 ENCOUNTER — Encounter: Payer: Self-pay | Admitting: Physical Medicine & Rehabilitation

## 2017-12-12 VITALS — BP 133/86 | HR 70

## 2017-12-12 DIAGNOSIS — G4700 Insomnia, unspecified: Secondary | ICD-10-CM | POA: Insufficient documentation

## 2017-12-12 DIAGNOSIS — I10 Essential (primary) hypertension: Secondary | ICD-10-CM | POA: Insufficient documentation

## 2017-12-12 DIAGNOSIS — Z760 Encounter for issue of repeat prescription: Secondary | ICD-10-CM | POA: Diagnosis not present

## 2017-12-12 DIAGNOSIS — E785 Hyperlipidemia, unspecified: Secondary | ICD-10-CM | POA: Insufficient documentation

## 2017-12-12 DIAGNOSIS — G90512 Complex regional pain syndrome I of left upper limb: Secondary | ICD-10-CM

## 2017-12-12 DIAGNOSIS — F419 Anxiety disorder, unspecified: Secondary | ICD-10-CM | POA: Diagnosis not present

## 2017-12-12 DIAGNOSIS — Z8673 Personal history of transient ischemic attack (TIA), and cerebral infarction without residual deficits: Secondary | ICD-10-CM | POA: Diagnosis not present

## 2017-12-12 DIAGNOSIS — I2510 Atherosclerotic heart disease of native coronary artery without angina pectoris: Secondary | ICD-10-CM | POA: Insufficient documentation

## 2017-12-12 DIAGNOSIS — M62838 Other muscle spasm: Secondary | ICD-10-CM | POA: Diagnosis not present

## 2017-12-12 DIAGNOSIS — M797 Fibromyalgia: Secondary | ICD-10-CM | POA: Insufficient documentation

## 2017-12-12 DIAGNOSIS — F329 Major depressive disorder, single episode, unspecified: Secondary | ICD-10-CM | POA: Insufficient documentation

## 2017-12-12 DIAGNOSIS — K219 Gastro-esophageal reflux disease without esophagitis: Secondary | ICD-10-CM | POA: Insufficient documentation

## 2017-12-12 MED ORDER — CYMBALTA 30 MG PO CPEP: 90.0000 mg | ORAL_CAPSULE | Freq: Every day | ORAL | 2 refills | Status: DC

## 2017-12-12 MED ORDER — HYDROCODONE-ACETAMINOPHEN 7.5-325 MG PO TABS: ORAL_TABLET | ORAL | 0 refills | Status: DC

## 2017-12-12 MED ORDER — TRAZODONE HCL 50 MG PO TABS: ORAL_TABLET | ORAL | 2 refills | Status: DC

## 2017-12-12 NOTE — Progress Notes (Signed)
Subjective:    Patient ID: [name], female    DOB: [birth year], 52 y.o.   MRN: [id number]  HPI 51 year old female with history of right wrist sprain in the 1990s.  This was a work related injury she underwent pain medicine evaluation in [place] and stellate ganglion blocks were tried with temporary relief of symptoms.  She was started on morphine and worked up to doses of over 100 mg/day.  She relocated to [facility] approximately 12 years ago Her pain medications were adjusted and was started on Cymbalta and lidocaine patch while her morphine dose was weaned over time and was discontinued and the patient was maintained on hydrocodone 5 mg at night tramadol extended release 100 mg/day and an additional 50 mg of tramadol at night. She has been on Topamax 100 mg twice daily for migraine prophylaxis started by a neurologist. She is also has been on Zanaflex 4 mg 3 times daily for more than 10 years at one point she was on ibuprofen 800 3 times daily but has been weaned down to 600 mg twice daily as needed Patient's pain has started increasing once a day again.  She does not wish to go back on higher dose narcotic analgesic type medications.  Her hydrocodone dose was increased to 7.5 mill grams twice daily.  In addition over the years her symptoms in the left upper extremity progressed to involve the left lower extremity and to a lesser extent the right lower extremity.  The right upper extremity has been spared. Pain Inventory Average Pain 8 Pain Right Now 8 My pain is constant, sharp, burning, stabbing, tingling and aching  In the last 24 hours, has pain interfered with the following? General activity 8 Relation with others 8 Enjoyment of life 8 What TIME of day is your pain at its worst? . Sleep (in general) Fair  Pain is worse with: walking, bending, sitting, standing and some activites Pain improves with: rest, heat/ice, therapy/exercise, pacing activities, medication, TENS  and injections Relief from Meds: 7  Mobility walk without assistance ability to climb steps?  no do you drive?  no  Function disabled: date disabled . I need assistance with the following:  meal prep, household duties and shopping  Neuro/Psych bladder control problems weakness numbness tremor tingling trouble walking spasms dizziness depression anxiety loss of taste or smell  Prior Studies Any changes since last visit?  no  Physicians involved in your care Any changes since last visit?  no   Family History  Problem Relation Age of Onset  . Cancer Mother        lung  . Hypertension Mother   . Cancer Father        colon  . Hypertension Sister   . Cancer Sister   . Cancer Brother   . Hyperlipidemia Sister   . Birth defects Maternal Grandmother        breast  . Breast cancer Maternal Grandmother   . Birth defects Paternal Grandmother        uterine, stomach, lung  . Breast cancer Paternal Grandmother    Social History   Socioeconomic History  . Marital status: Married    Spouse name: Not on file  . Number of children: 2  . Years of education: Not on file  . Highest education level: Not on file  Social Needs  . Financial resource strain: Not on file  . Food insecurity - worry: Not on file  . Food insecurity - inability: Not  on file  . Transportation needs - medical: Not on file  . Transportation needs - non-medical: Not on file  Occupational History  . 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 Smoking status: Never Smoker  . Smokeless tobacco: Never Used  Substance and Sexual Activity  . Alcohol use: Yes    Alcohol/week: 0.0 oz    Comment: 01/24/2013 "drink or 2 once or /twice/year, if that"  . Drug use: No  . Sexual activity: Not Currently  Other Topics Concern  . Not on file  Social History Narrative   No living will   Would want husband as POA--then sister, [name]   Would accept resuscitation attempts   Probably would not want tube  feeds if cognitively unaware   Past Surgical History:  Procedure Laterality Date  . APPENDECTOMY  ~ 06/2007  . BLADDER REPAIR  ~ 06/2007   "same day after bladder lift" (01/24/2013)  . BLADDER SUSPENSION  ~ 06/2007  . BREAST BIOPSY Left   . DIAGNOSTIC LAPAROSCOPY  1990's & ~ 2000   "I've had a couple; for endometrosis" (01/24/2013)  . HERNIA REPAIR    . INCISIONAL HERNIA REPAIR N/A 01/24/2013   Procedure: LAPAROSCOPIC INCISIONAL HERNIA;  Surgeon: Harl Bowie, MD;  Location: [facility];  Service: General;  Laterality: N/A;  . INSERTION OF MESH N/A 01/24/2013   Procedure: INSERTION OF MESH;  Surgeon: Harl Bowie, MD;  Location: [facility];  Service: General;  Laterality: N/A;  . LAPAROSCOPIC INCISIONAL / UMBILICAL / Progress  01/24/2013   IHR w/mesh/notes  . NASAL SEPTUM SURGERY  1980's?  . OOPHORECTOMY Right 2009  . TONSILLECTOMY  1990's  . VAGINAL HYSTERECTOMY  ` 06/2007   Past Medical History:  Diagnosis Date  . Allergy   . Anxiety   . Arthritis    "just the norm" (01/24/2013)  . ASCVD (arteriosclerotic cardiovascular disease)   . Asthma   . Daily headache    "depends on the season" (01/24/2013)  . Fibromyalgia   . GERD (gastroesophageal reflux disease)   . History of colonic polyps   . Hyperlipidemia   . Hypertension   . Migraines   . Obesity (BMI 30.0-34.9)   . RSD (reflex sympathetic dystrophy)   . Sleep apnea    NO MACHINE RECOMMENDED  . TIA (transient ischemic attack) ~ 2009   There were no vitals taken for this visit.  Opioid Risk Score:   Fall Risk Score:  `1  Depression screen PHQ 2/9  Depression screen [facility] 2/9 10/04/2017 09/05/2017 08/05/2017 04/04/2017 10/21/2016 11/26/2015 06/25/2015  Decreased Interest 1 0 0 3 3 3 3   Down, Depressed, Hopeless 1 0 0 2 2 2 2   PHQ - 2 Score 2 0 0 5 5 5 5   Altered sleeping - - - - - - 3  Tired, decreased energy - - - - - - 3  Change in appetite - - - - - - 2  Feeling bad or failure about yourself  - - - - - - 3  Trouble  concentrating - - - - - - 2  Moving slowly or fidgety/restless - - - - - - 0  Suicidal thoughts - - - - - - 1  PHQ-9 Score - - - - - - 19  Difficult doing work/chores - - - - - - Somewhat difficult  Some recent data might be hidden     Review of Systems  Constitutional: Negative.   HENT: Negative.  Eyes: Negative.   Respiratory: Negative.   Cardiovascular: Negative.   Gastrointestinal: Negative.   Endocrine: Negative.   Genitourinary: Negative.   Musculoskeletal: Positive for gait problem.  Skin: Negative.   Allergic/Immunologic: Negative.   Hematological: Negative.   Psychiatric/Behavioral: Positive for dysphoric mood. The patient is nervous/anxious.   All other systems reviewed and are negative.      Objective:   Physical Exam  Constitutional: She appears well-developed and well-nourished.  HENT:  Head: Normocephalic and atraumatic.  Eyes: Conjunctivae are normal. Pupils are equal, round, and reactive to light.  Neurological:  Tenderness with even light palpation i.e. allodynia in the right hand wrist and forearm area as well as the left ankle and foot. There is hyperhidrosis in the left palm but not in the foot.  There are no skin dystrophic changes or nailbed changes.  No evidence of contracture  Psychiatric: She has a normal mood and affect. Her behavior is normal. Judgment and thought content normal.  Nursing note and vitals reviewed.         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  She has been on a fairly complex pain regimen noted above.  She has been able to come off of high-dose narcotic analgesics.  She is having problems with pain despite her medications.  We discussed other treatment options and specifically discussed spinal cord stimulation.  She would like to consult with the physician who performs these procedures.  Will make referral to Dr. Dossie Arbour at [facility] pain  center. Follow-up 1 month with nurse practitioner  Over half of the 25 min visit was spent counseling and coordinating care.

## 2017-12-12 NOTE — Patient Instructions (Signed)
Spinal Cord Stimulation Trial Information A spinal cord stimulation trial is a test to see whether a spinal cord stimulator reduces your pain. A spinal cord stimulator is a small device that is attached to your back or inserted (implanted) in your back. The stimulator has small wires (leads) that connect it to your spinal cord. The stimulator sends electrical pulses through the leads to the spinal cord. This can relieve pain. Your health care provider may suggest a spinal cord stimulation trial if other treatments for chronic pain have not worked for you. Spinal cord stimulation may be used to manage pain that is caused by:  Coronary artery disease.  Failed back surgery.  Phantom limb sensation.  Peripheral neuropathy.  Complex regional pain syndrome.  Other syndromes that involve long-lasting (chronic) pain.  A trial period is usually 3-5 days, but this can vary among health care providers. After your trial period, you and your health care provider will discuss whether a permanent spinal cord stimulator is an option for you. The permanent stimulator may be an option depending on:  Whether the stimulator reduces your pain during the trial.  Whether the stimulator fits into your lifestyle.  Whether the cost of the stimulator is covered by your insurance.  How is a spinal cord stimulator placed for a trial? For a trial period, the stimulator is placed on your skin, not under it. Only the leads that connect the stimulator to the spinal cord are implanted under your skin. The exact location of the stimulator depends on where you have pain. There are two types of surgery for implanting the leads:  Noninvasive surgery. In this type of surgery, a small incision is made and needles are used to place the leads under your skin.  Open surgery. In this type of surgery, a larger incision is made, and the leads are implanted directly into your back.  How should I care for myself after a spinal cord  stimulator is placed? Activity  Return to your normal activities as told by your health care provider. Ask your health care provider what activities are safe for you.  Do not lift anything that is heavier than 10 lb (4.5 kg). General Instructions   Follow your health care provider's specific instructions about how to take care of your spinal cord stimulator and your incision.  Make sure to write down the following information so that you can share this information with your health care provider: ? Your responses to the stimulator, as told by your health care provider. ? Your pain level throughout the day. ? The amount and kind of pain medicine that you take.  Take over-the-counter and prescription medicines only as told by your health care provider.  Do not take baths, swim, or use a hot tub until your health care provider approves.  Tell all health care providers who care for you that you have a spinal cord stimulator. This is important information that could affect the medical treatment that you receive.  Keep all follow-up visits as told by your health care provider. This is important. When should I seek medical care? Seek medical care if:  You have more redness, swelling, or pain around your incision.  You have more fluid or blood coming from your incision.  Your incision feels warm to the touch.  You have pus or a bad smell coming from your incision.  The bandage (dressing) that covers your incision comes off.  When should I seek immediate medical care? Seek immediate medical  care if:  The stimulator leads come out.  Your pain gets worse.  You develop numbness or weakness in your legs, or you have difficulty walking.  You have problems urinating or having a bowel movement.  You have a fever.  You have symptoms that last for more than 2-3 days.  Your symptoms suddenly get worse.  This information is not intended to replace advice given to you by your health  care provider. Make sure you discuss any questions you have with your health care provider. Document Released: 01/19/2011 Document Revised: 06/01/2016 Document Reviewed: 04/24/2015 Elsevier Interactive Patient Education  [employer].

## 2017-12-22 ENCOUNTER — Telehealth: Payer: Self-pay

## 2017-12-22 MED ORDER — DULOXETINE HCL 30 MG PO CPEP: 90.0000 mg | ORAL_CAPSULE | Freq: Every day | ORAL | 2 refills | Status: DC

## 2017-12-22 MED ORDER — CYMBALTA 30 MG PO CPEP: 90.0000 mg | ORAL_CAPSULE | Freq: Every day | ORAL | 2 refills | Status: DC

## 2017-12-22 NOTE — Telephone Encounter (Signed)
Patient called back and verified that she is indeed on workers comp and that this medication should have been handled by them instead of her regular insurance.  Called [facility], they stated that the medication was ran under her workman's comp ins and she was able to pick it up on the written date of the medication which was 12-13-17.  They stated that at the time of refill that it may have been ran accidentally under her private insurance but it was lastly ran under her [employer] insurance after.

## 2017-12-22 NOTE — Telephone Encounter (Addendum)
Suzanne Hashimotoatricia from [employer] called about this patient. States that the medication Cymbalta that was recently ordered for her is NOT currently on patients formulary,  Requested that the medication be changed to its generic version.  Noted in patients chart that this patient is actually allergic to the generic version of the medication and will more then likely have to do a prior authorization for the brand name.  Will initate a Prior Auth for this patient for name brand Cymbalta if she is using regular insurance to cover it, need to find out if this patient is still being covered by [employer]. If she is then it should be them to manage this medication for her.  Attempted to call patient to figure out which insurance will pay for this medication.  No answer, left voicemail to return call.

## 2017-12-23 ENCOUNTER — Ambulatory Visit: Payer: Self-pay | Admitting: Obstetrics & Gynecology

## 2017-12-27 ENCOUNTER — Other Ambulatory Visit: Payer: Self-pay

## 2017-12-27 MED ORDER — CYMBALTA 30 MG PO CPEP: 90.0000 mg | ORAL_CAPSULE | Freq: Every day | ORAL | 2 refills | Status: DC

## 2018-01-04 ENCOUNTER — Telehealth: Payer: Self-pay | Admitting: *Deleted

## 2018-01-04 NOTE — Telephone Encounter (Signed)
[name] needs letter sent to [facility] # [phone] [name] & [name] attn Christina for medications.  Faxed. And [facility].

## 2018-01-05 ENCOUNTER — Other Ambulatory Visit: Payer: Self-pay | Admitting: Registered Nurse

## 2018-01-09 ENCOUNTER — Encounter: Payer: Worker's Compensation | Attending: Registered Nurse | Admitting: Registered Nurse

## 2018-01-09 ENCOUNTER — Encounter: Payer: Self-pay | Admitting: Registered Nurse

## 2018-01-09 ENCOUNTER — Other Ambulatory Visit: Payer: Self-pay

## 2018-01-09 VITALS — BP 136/79 | HR 69

## 2018-01-09 DIAGNOSIS — Z760 Encounter for issue of repeat prescription: Secondary | ICD-10-CM | POA: Insufficient documentation

## 2018-01-09 DIAGNOSIS — I10 Essential (primary) hypertension: Secondary | ICD-10-CM | POA: Insufficient documentation

## 2018-01-09 DIAGNOSIS — E669 Obesity, unspecified: Secondary | ICD-10-CM | POA: Insufficient documentation

## 2018-01-09 DIAGNOSIS — G90512 Complex regional pain syndrome I of left upper limb: Secondary | ICD-10-CM | POA: Diagnosis not present

## 2018-01-09 DIAGNOSIS — G4700 Insomnia, unspecified: Secondary | ICD-10-CM

## 2018-01-09 DIAGNOSIS — M792 Neuralgia and neuritis, unspecified: Secondary | ICD-10-CM | POA: Diagnosis not present

## 2018-01-09 DIAGNOSIS — M62838 Other muscle spasm: Secondary | ICD-10-CM | POA: Diagnosis not present

## 2018-01-09 DIAGNOSIS — Z9889 Other specified postprocedural states: Secondary | ICD-10-CM | POA: Insufficient documentation

## 2018-01-09 DIAGNOSIS — Z6830 Body mass index (BMI) 30.0-30.9, adult: Secondary | ICD-10-CM | POA: Diagnosis not present

## 2018-01-09 DIAGNOSIS — M79602 Pain in left arm: Secondary | ICD-10-CM | POA: Diagnosis not present

## 2018-01-09 DIAGNOSIS — G9050 Complex regional pain syndrome I, unspecified: Secondary | ICD-10-CM | POA: Diagnosis not present

## 2018-01-09 DIAGNOSIS — Z8673 Personal history of transient ischemic attack (TIA), and cerebral infarction without residual deficits: Secondary | ICD-10-CM | POA: Insufficient documentation

## 2018-01-09 DIAGNOSIS — G5692 Unspecified mononeuropathy of left upper limb: Secondary | ICD-10-CM

## 2018-01-09 DIAGNOSIS — Z90710 Acquired absence of both cervix and uterus: Secondary | ICD-10-CM | POA: Insufficient documentation

## 2018-01-09 DIAGNOSIS — F3289 Other specified depressive episodes: Secondary | ICD-10-CM | POA: Diagnosis not present

## 2018-01-09 DIAGNOSIS — Z79899 Other long term (current) drug therapy: Secondary | ICD-10-CM

## 2018-01-09 DIAGNOSIS — G894 Chronic pain syndrome: Secondary | ICD-10-CM | POA: Diagnosis not present

## 2018-01-09 DIAGNOSIS — Z5181 Encounter for therapeutic drug level monitoring: Secondary | ICD-10-CM | POA: Diagnosis not present

## 2018-01-09 DIAGNOSIS — F329 Major depressive disorder, single episode, unspecified: Secondary | ICD-10-CM | POA: Diagnosis not present

## 2018-01-09 MED ORDER — GABAPENTIN 100 MG PO CAPS: 100.0000 mg | ORAL_CAPSULE | Freq: Three times a day (TID) | ORAL | 3 refills | Status: DC

## 2018-01-09 MED ORDER — HYDROCODONE-ACETAMINOPHEN 7.5-325 MG PO TABS: ORAL_TABLET | ORAL | 0 refills | Status: DC

## 2018-01-09 MED ORDER — TIZANIDINE HCL 4 MG PO TABS: ORAL_TABLET | ORAL | 3 refills | Status: DC

## 2018-01-09 MED ORDER — TRAMADOL HCL ER 100 MG PO TB24: 100.0000 mg | ORAL_TABLET | Freq: Every day | ORAL | 2 refills | Status: DC

## 2018-01-09 MED ORDER — TOPIRAMATE 100 MG PO TABS: ORAL_TABLET | ORAL | 4 refills | Status: DC

## 2018-01-09 MED ORDER — TRAMADOL HCL 50 MG PO TABS: ORAL_TABLET | ORAL | 2 refills | Status: DC

## 2018-01-09 NOTE — Patient Instructions (Addendum)
Start Gabapentin at bedtime only for the next three nights  If Pain Persists increase Gabapentin to one capsule in the morning and one capsule at bedtime  If Pain Persists after three days increase Gabapentin to   One capsule in the Morning, one capsule Late Afternoon and One at Bedtime

## 2018-01-09 NOTE — Progress Notes (Signed)
Subjective:    Patient ID: [name], female    DOB: 03-21-1966, 52 y.o.   MRN: [id number]  HPI: [name] is a 52 year old female who returns for follow up appointmentfor chronic pain and medication refill. She states her pain is located in her left arm radiating into her left hand and left wrist with tingling and burning pain ( neuropathic pain). She's currently prescribe Topamax for her neuropathic pain, we will continue. She denies and chest pain or SOB. She rates her pain 7.  Her current exercise regime is walking short distances.   [name] Morphine equivalent is 36.67  MME.    Oral Swab was performed on 11/08/2017, it was consistent.   Pain Inventory Average Pain 7 Pain Right Now 7 My pain is constant, sharp, burning, stabbing, tingling and aching  In the last 24 hours, has pain interfered with the following? General activity 6 Relation with others 6 Enjoyment of life 6 What TIME of day is your pain at its worst? all Sleep (in general) Poor  Pain is worse with: walking, bending, sitting, inactivity and standing Pain improves with: rest, heat/ice, therapy/exercise, pacing activities, medication, TENS and injections Relief from Meds: 6  Mobility ability to climb steps?  yes do you drive?  yes  Function disabled: date disabled 09/25/13 I need assistance with the following:  meal prep, household duties and shopping  Neuro/Psych bladder control problems weakness numbness tremor tingling trouble walking spasms dizziness depression anxiety  Prior Studies Any changes since last visit?  no  Physicians involved in your care Any changes since last visit?  no   Family History  Problem Relation Age of Onset  . Cancer Mother        lung  . Hypertension Mother   . Cancer Father        colon  . Hypertension Sister   . Cancer Sister   . Cancer Brother   . Hyperlipidemia Sister   . Birth defects Maternal Grandmother        breast  .  Breast cancer Maternal Grandmother   . Birth defects Paternal Grandmother        uterine, stomach, lung  . Breast cancer Paternal Grandmother    Social History   Socioeconomic History  . Marital status: Married    Spouse name: Not on file  . Number of children: 2  . Years of education: Not on file  . Highest education level: Not on file  Occupational History  . 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 Financial resource strain: Not on file  . Food insecurity:    Worry: Not on file    Inability: Not on file  . Transportation needs:    Medical: Not on file    Non-medical: Not on file  Tobacco Use  . Smoking status: Never Smoker  . Smokeless tobacco: Never Used  Substance and Sexual Activity  . Alcohol use: Yes    Alcohol/week: 0.0 oz    Comment: 01/24/2013 "drink or 2 once or /twice/year, if that"  . Drug use: No  . Sexual activity: Not Currently  Lifestyle  . Physical activity:    Days per week: Not on file    Minutes per session: Not on file  . Stress: Not on file  Relationships  . Social connections:    Talks on phone: Not on file    Gets together: Not on file    Attends religious service: Not on file  Active member of club or organization: Not on file    Attends meetings of clubs or organizations: Not on file    Relationship status: Not on file  Other Topics Concern  . Not on file  Social History Narrative   No living will   Would want husband as POA--then sister, [name]   Would accept resuscitation attempts   Probably would not want tube feeds if cognitively unaware   Past Surgical History:  Procedure Laterality Date  . APPENDECTOMY  ~ 06/2007  . BLADDER REPAIR  ~ 06/2007   "same day after bladder lift" (01/24/2013)  . BLADDER SUSPENSION  ~ 06/2007  . BREAST BIOPSY Left   . DIAGNOSTIC LAPAROSCOPY  1990's & ~ 2000   "I've had a couple; for endometrosis" (01/24/2013)  . HERNIA REPAIR    . INCISIONAL HERNIA REPAIR N/A 01/24/2013   Procedure:  LAPAROSCOPIC INCISIONAL HERNIA;  Surgeon: Shelly Rubensteinouglas A Blackman, MD;  [facility];  Service: General;  Laterality: N/A;  . INSERTION OF MESH N/A 01/24/2013   Procedure: INSERTION OF MESH;  Surgeon: Shelly Rubensteinouglas A Blackman, MD;  Location: [facility] OR;  Service: General;  Laterality: N/A;  . LAPAROSCOPIC INCISIONAL / UMBILICAL / VENTRAL HERNIA REPAIR  01/24/2013   IHR w/mesh/notes  . NASAL SEPTUM SURGERY  1980's?  . OOPHORECTOMY Right 2009  . TONSILLECTOMY  1990's  . VAGINAL HYSTERECTOMY  ` 06/2007   Past Medical History:  Diagnosis Date  . Allergy   . Anxiety   . Arthritis    "just the norm" (01/24/2013)  . ASCVD (arteriosclerotic cardiovascular disease)   . Asthma   . Daily headache    "depends on the season" (01/24/2013)  . Fibromyalgia   . GERD (gastroesophageal reflux disease)   . History of colonic polyps   . Hyperlipidemia   . Hypertension   . Migraines   . Obesity (BMI 30.0-34.9)   . RSD (reflex sympathetic dystrophy)   . Sleep apnea    NO MACHINE RECOMMENDED  . TIA (transient ischemic attack) ~ 2009   BP 136/79   Pulse 69   SpO2 94%   Opioid Risk Score:  0 Fall Risk Score:  `1  Depression screen PHQ 2/9  [facility] 01/09/2018 10/04/2017 09/05/2017 08/05/2017 04/04/2017 10/21/2016 11/26/2015  Decreased Interest 1 1 0 0 3 3 3   Down, Depressed, Hopeless 1 1 0 0 2 2 2   PHQ - 2 Score 2 2 0 0 5 5 5   Altered sleeping - - - - - - -  Tired, decreased energy - - - - - - -  Change in appetite - - - - - - -  Feeling bad or failure about yourself  - - - - - - -  Trouble concentrating - - - - - - -  Moving slowly or fidgety/restless - - - - - - -  Suicidal thoughts - - - - - - -  PHQ-9 Score - - - - - - -  Difficult doing work/chores - - - - - - -  Some recent data might be hidden     Review of Systems  HENT: Negative.   Eyes: Negative.   Cardiovascular: Negative.   Gastrointestinal: Negative.   Endocrine: Negative.   Genitourinary: Negative.   Musculoskeletal:  Positive for gait problem.  Skin: Negative.   Allergic/Immunologic: Negative.   Neurological: Positive for tremors, weakness and numbness.       Tingling Dizziness Spasms   Psychiatric/Behavioral: Positive for dysphoric mood.  The patient is nervous/anxious.   All other systems reviewed and are negative.      Objective:   Physical Exam  Constitutional: She is oriented to person, place, and time. She appears well-developed and well-nourished.  HENT:  Head: Normocephalic and atraumatic.  Neck: Normal range of motion. Neck supple.  Cardiovascular: Normal rate and regular rhythm.  Pulmonary/Chest: Effort normal and breath sounds normal.  Musculoskeletal:  Normal Muscle Bulk and Muscle Testing Reveals: Upper Extremities: Right: Full ROM and Muscle Strength 5/5 Left: Decreased ROM 90 Degrees and Muscle Strength 3/5 Bilateral AC Joint Tenderness Thoracic Hypersensitivity: T-1-T-7 Bilateral Lower  Extremities: Decreased ROM and Muscle Strength 4/5 Bilateral Lower Extremities Flexion Produces Pain into Bilateral  LowerExtremities and Bilateral Feet.  Arises from Table Slowly Antalgic Gait  Neurological: She is alert and oriented to person, place, and time.  Skin: Skin is warm and dry.  Psychiatric: She has a normal mood and affect.  Nursing note and vitals reviewed.         Assessment & Plan:  1.Complex Regional Pain Syndrome Type 1: Continue Topamax and Cymbalta. 01/09/2017 Refilled:Hydro-codone 7.5/325mg  one tablet twice a day one in the morning and one at bedtime#60. Continue:Tramadol 100 mg ER daily #30 and Tramadol 50 mg one tablet by mouth BID. One tablet in the afternoon and one tablet in the evening. We will continue the opioid monitoring program, this consists of regular clinic visits, examinations, urine drug screen, pill counts as well as use of [place] Controlled Substance Reporting System. 2. Depression: Continue Cymbalta. 01/09/2018 3. Insomnia:  ContinueTrazodone50 mg HS. 01/09/2018 4. Muscle Spasms: Continue Tizanidine. 01/09/2018 5. Chronic Pain Syndrome: Continue Ibuprofen. 01/09/2018  20 minutes of face to face patient care time was spent during this visit. All questions were encouraged and answered.  F/U in 1 month

## 2018-02-06 ENCOUNTER — Encounter: Payer: Self-pay | Admitting: Registered Nurse

## 2018-02-06 ENCOUNTER — Other Ambulatory Visit: Payer: Self-pay

## 2018-02-06 ENCOUNTER — Encounter: Payer: Worker's Compensation | Attending: Registered Nurse | Admitting: Registered Nurse

## 2018-02-06 VITALS — BP 139/82 | HR 73 | Ht 67.0 in | Wt 175.8 lb

## 2018-02-06 DIAGNOSIS — I10 Essential (primary) hypertension: Secondary | ICD-10-CM | POA: Insufficient documentation

## 2018-02-06 DIAGNOSIS — E785 Hyperlipidemia, unspecified: Secondary | ICD-10-CM | POA: Diagnosis not present

## 2018-02-06 DIAGNOSIS — F3289 Other specified depressive episodes: Secondary | ICD-10-CM

## 2018-02-06 DIAGNOSIS — G90512 Complex regional pain syndrome I of left upper limb: Secondary | ICD-10-CM

## 2018-02-06 DIAGNOSIS — F329 Major depressive disorder, single episode, unspecified: Secondary | ICD-10-CM | POA: Insufficient documentation

## 2018-02-06 DIAGNOSIS — Z8673 Personal history of transient ischemic attack (TIA), and cerebral infarction without residual deficits: Secondary | ICD-10-CM | POA: Insufficient documentation

## 2018-02-06 DIAGNOSIS — F419 Anxiety disorder, unspecified: Secondary | ICD-10-CM | POA: Diagnosis not present

## 2018-02-06 DIAGNOSIS — G894 Chronic pain syndrome: Secondary | ICD-10-CM | POA: Insufficient documentation

## 2018-02-06 DIAGNOSIS — K219 Gastro-esophageal reflux disease without esophagitis: Secondary | ICD-10-CM | POA: Insufficient documentation

## 2018-02-06 DIAGNOSIS — Z9889 Other specified postprocedural states: Secondary | ICD-10-CM | POA: Diagnosis not present

## 2018-02-06 DIAGNOSIS — Z5181 Encounter for therapeutic drug level monitoring: Secondary | ICD-10-CM

## 2018-02-06 DIAGNOSIS — Z8249 Family history of ischemic heart disease and other diseases of the circulatory system: Secondary | ICD-10-CM | POA: Diagnosis not present

## 2018-02-06 DIAGNOSIS — Z79899 Other long term (current) drug therapy: Secondary | ICD-10-CM | POA: Diagnosis not present

## 2018-02-06 DIAGNOSIS — G4730 Sleep apnea, unspecified: Secondary | ICD-10-CM | POA: Insufficient documentation

## 2018-02-06 DIAGNOSIS — J45909 Unspecified asthma, uncomplicated: Secondary | ICD-10-CM | POA: Diagnosis not present

## 2018-02-06 DIAGNOSIS — Z801 Family history of malignant neoplasm of trachea, bronchus and lung: Secondary | ICD-10-CM | POA: Insufficient documentation

## 2018-02-06 DIAGNOSIS — R200 Anesthesia of skin: Secondary | ICD-10-CM | POA: Insufficient documentation

## 2018-02-06 DIAGNOSIS — M62838 Other muscle spasm: Secondary | ICD-10-CM | POA: Diagnosis not present

## 2018-02-06 DIAGNOSIS — Z800 Family history of malignant neoplasm of digestive organs: Secondary | ICD-10-CM | POA: Diagnosis not present

## 2018-02-06 DIAGNOSIS — Z760 Encounter for issue of repeat prescription: Secondary | ICD-10-CM | POA: Diagnosis not present

## 2018-02-06 DIAGNOSIS — M797 Fibromyalgia: Secondary | ICD-10-CM | POA: Insufficient documentation

## 2018-02-06 DIAGNOSIS — Z803 Family history of malignant neoplasm of breast: Secondary | ICD-10-CM | POA: Insufficient documentation

## 2018-02-06 DIAGNOSIS — R42 Dizziness and giddiness: Secondary | ICD-10-CM | POA: Insufficient documentation

## 2018-02-06 DIAGNOSIS — Z09 Encounter for follow-up examination after completed treatment for conditions other than malignant neoplasm: Secondary | ICD-10-CM | POA: Insufficient documentation

## 2018-02-06 DIAGNOSIS — I2510 Atherosclerotic heart disease of native coronary artery without angina pectoris: Secondary | ICD-10-CM | POA: Diagnosis not present

## 2018-02-06 DIAGNOSIS — G9050 Complex regional pain syndrome I, unspecified: Secondary | ICD-10-CM | POA: Insufficient documentation

## 2018-02-06 DIAGNOSIS — E669 Obesity, unspecified: Secondary | ICD-10-CM | POA: Insufficient documentation

## 2018-02-06 DIAGNOSIS — G4700 Insomnia, unspecified: Secondary | ICD-10-CM | POA: Insufficient documentation

## 2018-02-06 MED ORDER — HYDROCODONE-ACETAMINOPHEN 7.5-325 MG PO TABS: ORAL_TABLET | ORAL | 0 refills | Status: DC

## 2018-02-06 MED ORDER — TRAZODONE HCL 50 MG PO TABS
ORAL_TABLET | ORAL | 2 refills | Status: DC
Start: 2018-02-06 — End: 2018-06-19

## 2018-02-06 NOTE — Progress Notes (Signed)
Subjective:    Patient ID: [name], female    DOB: [birth year], 52 y.o.   MRN: [id number]  HPI: [name] is a 52 year old female who returns for follow up appointment for chronic pain and medication refill. She states her pain is located in her left arm. She rates her pain 9. Her current exercise regime is walking short distances.   [name] Morphine Equivalent is 34.17 MME.   Last Oral Swab was Performed 11/08/2017 it was consistent.   Pain Inventory Average Pain 8 Pain Right Now 9 My pain is intermittent, constant, stabbing, tingling and aching  In the last 24 hours, has pain interfered with the following? General activity 8 Relation with others 8 Enjoyment of life 7 What TIME of day is your pain at its worst? daytime evening night Sleep (in general) Poor  Pain is worse with: walking, bending, sitting, standing and some activites Pain improves with: rest, heat/ice, therapy/exercise, pacing activities, medication and TENS Relief from Meds: 6  Mobility ability to climb steps?  yes do you drive?  yes  Function disabled: date disabled 09/1993 I need assistance with the following:  meal prep, household duties and shopping  Neuro/Psych bladder control problems weakness numbness tremor tingling trouble walking spasms dizziness depression anxiety loss of taste or smell  Prior Studies Any changes since last visit?  no  Physicians involved in your care Any changes since last visit?  no   Family History  Problem Relation Age of Onset  . Cancer Mother        lung  . Hypertension Mother   . Cancer Father        colon  . Hypertension Sister   . Cancer Sister   . Cancer Brother   . Hyperlipidemia Sister   . Birth defects Maternal Grandmother        breast  . Breast cancer Maternal Grandmother   . Birth defects Paternal Grandmother        uterine, stomach, lung  . Breast cancer Paternal Grandmother    Social History    Socioeconomic History  . Marital status: Married    Spouse name: Not on file  . Number of children: 2  . Years of education: Not on file  . Highest education level: Not on file  Occupational History  . 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 Financial resource strain: Not on file  . Food insecurity:    Worry: Not on file    Inability: Not on file  . Transportation needs:    Medical: Not on file    Non-medical: Not on file  Tobacco Use  . Smoking status: Never Smoker  . Smokeless tobacco: Never Used  Substance and Sexual Activity  . Alcohol use: Yes    Alcohol/week: 0.0 oz    Comment: 01/24/2013 "drink or 2 once or /twice/year, if that"  . Drug use: No  . Sexual activity: Not Currently  Lifestyle  . Physical activity:    Days per week: Not on file    Minutes per session: Not on file  . Stress: Not on file  Relationships  . Social connections:    Talks on phone: Not on file    Gets together: Not on file    Attends religious service: Not on file    Active member of club or organization: Not on file    Attends meetings of clubs or organizations: Not on file    Relationship status: Not  on file  Other Topics Concern  . Not on file  Social History Narrative   No living will   Would want husband as POA--then sister, Neoma Laming   Would accept resuscitation attempts   Probably would not want tube feeds if cognitively unaware   Past Surgical History:  Procedure Laterality Date  . APPENDECTOMY  ~ 06/2007  . BLADDER REPAIR  ~ 06/2007   "same day after bladder lift" (01/24/2013)  . BLADDER SUSPENSION  ~ 06/2007  . BREAST BIOPSY Left   . DIAGNOSTIC LAPAROSCOPY  1990's & ~ 2000   "I've had a couple; for endometrosis" (01/24/2013)  . HERNIA REPAIR    . INCISIONAL HERNIA REPAIR N/A 01/24/2013   Procedure: LAPAROSCOPIC INCISIONAL HERNIA;  Surgeon: Harl Bowie, MD;  Location: [place];  Service: General;  Laterality: N/A;  . INSERTION OF MESH N/A 01/24/2013   Procedure:  INSERTION OF MESH;  Surgeon: Harl Bowie, MD;  Location: [place];  Service: General;  Laterality: N/A;  . LAPAROSCOPIC INCISIONAL / UMBILICAL / [place]  01/24/2013   IHR w/mesh/notes  . NASAL SEPTUM SURGERY  1980's?  . OOPHORECTOMY Right 2009  . TONSILLECTOMY  1990's  . VAGINAL HYSTERECTOMY  ` 06/2007   Past Medical History:  Diagnosis Date  . Allergy   . Anxiety   . Arthritis    "just the norm" (01/24/2013)  . ASCVD (arteriosclerotic cardiovascular disease)   . Asthma   . Daily headache    "depends on the season" (01/24/2013)  . Fibromyalgia   . GERD (gastroesophageal reflux disease)   . History of colonic polyps   . Hyperlipidemia   . Hypertension   . Migraines   . Obesity (BMI 30.0-34.9)   . RSD (reflex sympathetic dystrophy)   . Sleep apnea    NO MACHINE RECOMMENDED  . TIA (transient ischemic attack) ~ 2009   There were no vitals taken for this visit.  Opioid Risk Score:   Fall Risk Score:  `1  Depression screen PHQ 2/9  Depression screen [facility] 2/9 02/06/2018 01/09/2018 10/04/2017 09/05/2017 08/05/2017 04/04/2017 10/21/2016  Decreased Interest 1 1 1  0 0 3 3  Down, Depressed, Hopeless 1 1 1  0 0 2 2  PHQ - 2 Score 2 2 2  0 0 5 5  Altered sleeping 0 - - - - - -  Tired, decreased energy 1 - - - - - -  Change in appetite 0 - - - - - -  Feeling bad or failure about yourself  0 - - - - - -  Trouble concentrating 0 - - - - - -  Moving slowly or fidgety/restless 0 - - - - - -  Suicidal thoughts 0 - - - - - -  PHQ-9 Score 3 - - - - - -  Difficult doing work/chores Not difficult at all - - - - - -  Some recent data might be hidden    Review of Systems  Constitutional: Negative.   HENT: Negative.   Eyes: Negative.   Respiratory: Negative.   Cardiovascular: Negative.   Gastrointestinal: Negative.   Endocrine: Negative.   Genitourinary: Negative.   Musculoskeletal: Negative.   Skin: Negative.   Allergic/Immunologic: Negative.   Neurological: Negative.    Hematological: Negative.   All other systems reviewed and are negative.      Objective:   Physical Exam  Constitutional: She is oriented to person, place, and time. She appears well-developed and well-nourished.  HENT:  Head: Normocephalic  and atraumatic.  Neck: Normal range of motion. Neck supple.  Cardiovascular: Normal rate and regular rhythm.  Pulmonary/Chest: Effort normal and breath sounds normal.  Musculoskeletal:  Normal Muscle Bulk and Muscle Testing Reveals: Upper Extremities: Right: Full ROM and Muscle Strength 5/5 Left: Decreased ROM 90 Degrees and Muscle Strength 3/5 Lower Extremities: Full ROM and Muscle Strength 4/5 Arises from Table Slowly Antalgic Gait   Neurological: She is alert and oriented to person, place, and time.  Skin: Skin is warm and dry.  Psychiatric: She has a normal mood and affect.  Nursing note and vitals reviewed.         Assessment & Plan:  1.Complex Regional Pain Syndrome Type 1: Continue Topamax and Cymbalta. 02/06/2017 Refilled:Hydro-codone 7.5/325mg one tablet twice a day one in the morning and one at bedtime#60. Continue:Tramadol 100 mg ER daily #30 and Tramadol 50 mg one tablet by mouth BID. One tablet in the afternoon and one tablet in the evening. We will continue the opioid monitoring program, this consists of regular clinic visits, examinations, urine drug screen, pill counts as well as use of [place] Controlled Substance Reporting System. 2. Depression: Continue Cymbalta. 02/06/2018 3. Insomnia: ContinueTrazodone50 mg HS. 02/06/2018 4. Muscle Spasms: Continue Tizanidine. 02/06/2018 5. Chronic Pain Syndrome: Continue Ibuprofen. 02/06/2018  20 minutes of face to face patient care time was spent during this visit. All questions were encouraged and answered.  F/U in 1 month

## 2018-03-01 ENCOUNTER — Telehealth: Payer: Self-pay | Admitting: Physical Medicine & Rehabilitation

## 2018-03-01 NOTE — Telephone Encounter (Signed)
Received a letter from [employer] workers [employer] disputing payment for this patient - forwarding to [employer] and PB to research

## 2018-03-07 ENCOUNTER — Encounter: Payer: Self-pay | Admitting: Registered Nurse

## 2018-03-07 ENCOUNTER — Encounter: Payer: Worker's Compensation | Attending: Registered Nurse | Admitting: Registered Nurse

## 2018-03-07 VITALS — BP 129/87 | HR 107 | Resp 14 | Ht 67.0 in | Wt 175.0 lb

## 2018-03-07 DIAGNOSIS — Z79899 Other long term (current) drug therapy: Secondary | ICD-10-CM | POA: Diagnosis not present

## 2018-03-07 DIAGNOSIS — G894 Chronic pain syndrome: Secondary | ICD-10-CM | POA: Diagnosis not present

## 2018-03-07 DIAGNOSIS — Z90721 Acquired absence of ovaries, unilateral: Secondary | ICD-10-CM | POA: Diagnosis not present

## 2018-03-07 DIAGNOSIS — F419 Anxiety disorder, unspecified: Secondary | ICD-10-CM | POA: Insufficient documentation

## 2018-03-07 DIAGNOSIS — F3289 Other specified depressive episodes: Secondary | ICD-10-CM

## 2018-03-07 DIAGNOSIS — M62838 Other muscle spasm: Secondary | ICD-10-CM | POA: Diagnosis not present

## 2018-03-07 DIAGNOSIS — G4700 Insomnia, unspecified: Secondary | ICD-10-CM | POA: Diagnosis not present

## 2018-03-07 DIAGNOSIS — Z8249 Family history of ischemic heart disease and other diseases of the circulatory system: Secondary | ICD-10-CM | POA: Insufficient documentation

## 2018-03-07 DIAGNOSIS — G9050 Complex regional pain syndrome I, unspecified: Secondary | ICD-10-CM | POA: Diagnosis not present

## 2018-03-07 DIAGNOSIS — Z8673 Personal history of transient ischemic attack (TIA), and cerebral infarction without residual deficits: Secondary | ICD-10-CM | POA: Diagnosis not present

## 2018-03-07 DIAGNOSIS — G90512 Complex regional pain syndrome I of left upper limb: Secondary | ICD-10-CM | POA: Diagnosis not present

## 2018-03-07 DIAGNOSIS — F329 Major depressive disorder, single episode, unspecified: Secondary | ICD-10-CM | POA: Insufficient documentation

## 2018-03-07 DIAGNOSIS — Z800 Family history of malignant neoplasm of digestive organs: Secondary | ICD-10-CM | POA: Diagnosis not present

## 2018-03-07 DIAGNOSIS — Z5181 Encounter for therapeutic drug level monitoring: Secondary | ICD-10-CM

## 2018-03-07 DIAGNOSIS — G4730 Sleep apnea, unspecified: Secondary | ICD-10-CM | POA: Insufficient documentation

## 2018-03-07 DIAGNOSIS — Z90710 Acquired absence of both cervix and uterus: Secondary | ICD-10-CM | POA: Diagnosis not present

## 2018-03-07 DIAGNOSIS — Z801 Family history of malignant neoplasm of trachea, bronchus and lung: Secondary | ICD-10-CM | POA: Diagnosis not present

## 2018-03-07 MED ORDER — TRAMADOL HCL ER 100 MG PO TB24: 100.0000 mg | ORAL_TABLET | Freq: Every day | ORAL | 2 refills | Status: DC

## 2018-03-07 MED ORDER — HYDROCODONE-ACETAMINOPHEN 7.5-325 MG PO TABS: ORAL_TABLET | ORAL | 0 refills | Status: DC

## 2018-03-07 MED ORDER — TRAMADOL HCL 50 MG PO TABS: ORAL_TABLET | ORAL | 2 refills | Status: DC

## 2018-03-07 NOTE — Progress Notes (Signed)
Subjective:    Patient ID: [name], female    DOB: Dec 16, 1965, 52 y.o.   MRN: [id number]  HPI: [name] is a 52 year old female who returns for follow up appointment for chronic pain and medication refill. She states her pain is located in her left arm. She rates her pain 7. Her current exercise regime is walking.   [name] Morphine equivalent is 34.50 MME.  Last Oral Swab was Performed on 11/08/2017, it was consistent.   Pain Inventory Average Pain 8 Pain Right Now 7 My pain is intermittent, constant, sharp, burning, stabbing, tingling and aching  In the last 24 hours, has pain interfered with the following? General activity 7 Relation with others 7 Enjoyment of life 7 What TIME of day is your pain at its worst? all Sleep (in general) Poor  Pain is worse with: walking, bending, sitting, inactivity and standing Pain improves with: rest, heat/ice, therapy/exercise, pacing activities, medication, TENS and injections Relief from Meds: 5  Mobility ability to climb steps?  yes do you drive?  yes  Function disabled: date disabled . I need assistance with the following:  meal prep, household duties and shopping  Neuro/Psych bladder control problems weakness numbness tremor tingling trouble walking spasms dizziness depression anxiety  Prior Studies Any changes since last visit?  no  Physicians involved in your care Any changes since last visit?  no   Family History  Problem Relation Age of Onset  . Cancer Mother        lung  . Hypertension Mother   . Cancer Father        colon  . Hypertension Sister   . Cancer Sister   . Cancer Brother   . Hyperlipidemia Sister   . Birth defects Maternal Grandmother        breast  . Breast cancer Maternal Grandmother   . Birth defects Paternal Grandmother        uterine, stomach, lung  . Breast cancer Paternal Grandmother    Social History   Socioeconomic History  . Marital status:  Married    Spouse name: Not on file  . Number of children: 2  . Years of education: Not on file  . Highest education level: Not on file  Occupational History  . 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 Financial resource strain: Not on file  . Food insecurity:    Worry: Not on file    Inability: Not on file  . Transportation needs:    Medical: Not on file    Non-medical: Not on file  Tobacco Use  . Smoking status: Never Smoker  . Smokeless tobacco: Never Used  Substance and Sexual Activity  . Alcohol use: Yes    Alcohol/week: 0.0 oz    Comment: 01/24/2013 "drink or 2 once or /twice/year, if that"  . Drug use: No  . Sexual activity: Not Currently  Lifestyle  . Physical activity:    Days per week: Not on file    Minutes per session: Not on file  . Stress: Not on file  Relationships  . Social connections:    Talks on phone: Not on file    Gets together: Not on file    Attends religious service: Not on file    Active member of club or organization: Not on file    Attends meetings of clubs or organizations: Not on file    Relationship status: Not on file  Other Topics Concern  .  Not on file  Social History Narrative   No living will   Would want husband as POA--then sister, [name]   Would accept resuscitation attempts   Probably would not want tube feeds if cognitively unaware   Past Surgical History:  Procedure Laterality Date  . APPENDECTOMY  ~ 06/2007  . BLADDER REPAIR  ~ 06/2007   "same day after bladder lift" (01/24/2013)  . BLADDER SUSPENSION  ~ 06/2007  . BREAST BIOPSY Left   . DIAGNOSTIC LAPAROSCOPY  1990's & ~ 2000   "I've had a couple; for endometrosis" (01/24/2013)  . HERNIA REPAIR    . INCISIONAL HERNIA REPAIR N/A 01/24/2013   Procedure: LAPAROSCOPIC INCISIONAL HERNIA;  Surgeon: Suzanne Bowie, MD;  Location: [place];  Service: General;  Laterality: N/A;  . INSERTION OF MESH N/A 01/24/2013   Procedure: INSERTION OF MESH;  Surgeon: Suzanne Bowie, MD;   Location: [place];  Service: General;  Laterality: N/A;  . LAPAROSCOPIC INCISIONAL / UMBILICAL / [place]  01/24/2013   IHR w/mesh/notes  . NASAL SEPTUM SURGERY  1980's?  . OOPHORECTOMY Right 2009  . TONSILLECTOMY  1990's  . VAGINAL HYSTERECTOMY  ` 06/2007   Past Medical History:  Diagnosis Date  . Allergy   . Anxiety   . Arthritis    "just the norm" (01/24/2013)  . ASCVD (arteriosclerotic cardiovascular disease)   . Asthma   . Daily headache    "depends on the season" (01/24/2013)  . Fibromyalgia   . GERD (gastroesophageal reflux disease)   . History of colonic polyps   . Hyperlipidemia   . Hypertension   . Migraines   . Obesity (BMI 30.0-34.9)   . RSD (reflex sympathetic dystrophy)   . Sleep apnea    NO MACHINE RECOMMENDED  . TIA (transient ischemic attack) ~ 2009   BP 129/87 (BP Location: Right Arm, Patient Position: Sitting, Cuff Size: Normal)   Pulse (!) 107   Resp 14   Ht 5' 7"  (1.702 m)   Wt 175 lb (79.4 kg)   SpO2 96%   BMI 27.41 kg/m   Opioid Risk Score:   Fall Risk Score:  `1  Depression screen PHQ 2/9  Depression screen [facility] 2/9 02/06/2018 01/09/2018 10/04/2017 09/05/2017 08/05/2017 04/04/2017 10/21/2016  Decreased Interest 1 1 1  0 0 3 3  Down, Depressed, Hopeless 1 1 1  0 0 2 2  PHQ - 2 Score 2 2 2  0 0 5 5  Altered sleeping 0 - - - - - -  Tired, decreased energy 1 - - - - - -  Change in appetite 0 - - - - - -  Feeling bad or failure about yourself  0 - - - - - -  Trouble concentrating 0 - - - - - -  Moving slowly or fidgety/restless 0 - - - - - -  Suicidal thoughts 0 - - - - - -  PHQ-9 Score 3 - - - - - -  Difficult doing work/chores Not difficult at all - - - - - -  Some recent data might be hidden    Review of Systems  Constitutional: Negative.   HENT: Negative.   Eyes: Negative.   Respiratory: Negative.   Cardiovascular: Negative.   Gastrointestinal: Negative.   Endocrine: Negative.   Genitourinary: Positive for difficulty urinating.    Musculoskeletal: Positive for arthralgias, back pain, myalgias and neck pain.       Spasms   Skin: Negative.   Allergic/Immunologic: Negative.  Neurological: Positive for dizziness, tremors, weakness and numbness.       Tingling   Hematological: Negative.   Psychiatric/Behavioral: Positive for dysphoric mood. The patient is nervous/anxious.        Objective:   Physical Exam  Constitutional: She is oriented to person, place, and time. She appears well-developed and well-nourished.  HENT:  Head: Normocephalic and atraumatic.  Neck: Normal range of motion. Neck supple.  Cardiovascular: Normal rate and regular rhythm.  Pulmonary/Chest: Effort normal and breath sounds normal.  Musculoskeletal:  Normal Muscle Bulk and Muscle Testing Reveals: Upper Extremities: Right:Full ROM and Muscle Strength 4/5 Left: Decreased ROM: 90 Degrees and Muscle Strength 3/4 Lower Extremities: Full ROM and Muscle Strength 4/5 Bilateral Lower Extremities Flexion Produces Pain into Lower Extremities Arises from Table Slowly Antalgic gait   Neurological: She is alert and oriented to person, place, and time.  Skin: Skin is warm and dry.  Nursing note and vitals reviewed.         Assessment & Plan:  1.Complex Regional Pain Syndrome Type 1: Continue Topamax and Cymbalta. 03/07/2017 Refilled:Hydro-codone 7.5/325mg one tablet twice a day one in the morning and one at bedtime#60. Continue:Tramadol 100 mg ER daily #30 and Tramadol 50 mg one tablet by mouth BID. One tablet in the afternoon and one tablet in the evening. We will continue the opioid monitoring program, this consists of regular clinic visits, examinations, urine drug screen, pill counts as well as use of [place] Controlled Substance Reporting System. 2. Depression: Continue current medication regimen with Cymbalta. 03/07/2018 3. Insomnia: Continue current medication regimen with Trazodone50 mg HS. 03/07/2018 4. Muscle Spasms:  Continue current medication regimen with Tizanidine. 03/07/2018 5. Chronic Pain Syndrome: Continue Ibuprofen. 03/07/2018  20 minutes of face to face patient care time was spent during this visit. All questions were encouraged and answered.  F/U in 1 month

## 2018-04-06 ENCOUNTER — Encounter: Payer: Self-pay | Admitting: Registered Nurse

## 2018-04-06 ENCOUNTER — Encounter: Payer: Worker's Compensation | Attending: Registered Nurse | Admitting: Registered Nurse

## 2018-04-06 VITALS — BP 123/82 | HR 74 | Resp 14 | Ht 67.0 in | Wt 174.0 lb

## 2018-04-06 DIAGNOSIS — F3289 Other specified depressive episodes: Secondary | ICD-10-CM | POA: Diagnosis not present

## 2018-04-06 DIAGNOSIS — I10 Essential (primary) hypertension: Secondary | ICD-10-CM | POA: Insufficient documentation

## 2018-04-06 DIAGNOSIS — G4700 Insomnia, unspecified: Secondary | ICD-10-CM | POA: Diagnosis not present

## 2018-04-06 DIAGNOSIS — F329 Major depressive disorder, single episode, unspecified: Secondary | ICD-10-CM | POA: Diagnosis not present

## 2018-04-06 DIAGNOSIS — G894 Chronic pain syndrome: Secondary | ICD-10-CM | POA: Diagnosis not present

## 2018-04-06 DIAGNOSIS — E669 Obesity, unspecified: Secondary | ICD-10-CM | POA: Insufficient documentation

## 2018-04-06 DIAGNOSIS — Z9889 Other specified postprocedural states: Secondary | ICD-10-CM | POA: Diagnosis not present

## 2018-04-06 DIAGNOSIS — Z8673 Personal history of transient ischemic attack (TIA), and cerebral infarction without residual deficits: Secondary | ICD-10-CM | POA: Diagnosis not present

## 2018-04-06 DIAGNOSIS — Z6830 Body mass index (BMI) 30.0-30.9, adult: Secondary | ICD-10-CM | POA: Insufficient documentation

## 2018-04-06 DIAGNOSIS — Z760 Encounter for issue of repeat prescription: Secondary | ICD-10-CM | POA: Diagnosis present

## 2018-04-06 DIAGNOSIS — M62838 Other muscle spasm: Secondary | ICD-10-CM | POA: Diagnosis not present

## 2018-04-06 DIAGNOSIS — Z90710 Acquired absence of both cervix and uterus: Secondary | ICD-10-CM | POA: Insufficient documentation

## 2018-04-06 DIAGNOSIS — Z79899 Other long term (current) drug therapy: Secondary | ICD-10-CM

## 2018-04-06 DIAGNOSIS — G9050 Complex regional pain syndrome I, unspecified: Secondary | ICD-10-CM | POA: Diagnosis not present

## 2018-04-06 DIAGNOSIS — G90512 Complex regional pain syndrome I of left upper limb: Secondary | ICD-10-CM

## 2018-04-06 DIAGNOSIS — M79602 Pain in left arm: Secondary | ICD-10-CM | POA: Diagnosis not present

## 2018-04-06 DIAGNOSIS — Z5181 Encounter for therapeutic drug level monitoring: Secondary | ICD-10-CM | POA: Diagnosis not present

## 2018-04-06 MED ORDER — HYDROCODONE-ACETAMINOPHEN 7.5-325 MG PO TABS: ORAL_TABLET | ORAL | 0 refills | Status: DC

## 2018-04-06 MED ORDER — TIZANIDINE HCL 4 MG PO TABS: ORAL_TABLET | ORAL | 3 refills | Status: DC

## 2018-04-06 NOTE — Progress Notes (Signed)
Subjective:    Patient ID: [name], female    DOB: 1966/07/26, 52 y.o.   MRN: [id number]  HPI: [name] is a 52 year old female who returns for follow up appointment for chronic pain and medication refill. She states her pain is located in her left arm. She rates her pain 9. Her current exercise regime is walking and pool therapy.   [name] Morphine Equivalent is 35.00 MME. Last Oral Swab was Performed on 11/08/2017, it was consistent.   Pain Inventory Average Pain 7 Pain Right Now 9 My pain is intermittent, constant, sharp, burning, dull, stabbing, tingling and aching  In the last 24 hours, has pain interfered with the following? General activity 4 Relation with others 4 Enjoyment of life 4 What TIME of day is your pain at its worst? all Sleep (in general) Poor  Pain is worse with: walking, bending, sitting, standing and some activites Pain improves with: rest, heat/ice, therapy/exercise, pacing activities, medication, TENS and injections Relief from Meds: 6  Mobility walk without assistance  Function disabled: date disabled . I need assistance with the following:  meal prep, household duties and shopping  Neuro/Psych bladder control problems weakness numbness tremor tingling trouble walking spasms dizziness depression anxiety  Prior Studies Any changes since last visit?  no  Physicians involved in your care Any changes since last visit?  no   Family History  Problem Relation Age of Onset  . Cancer Mother        lung  . Hypertension Mother   . Cancer Father        colon  . Hypertension Sister   . Cancer Sister   . Cancer Brother   . Hyperlipidemia Sister   . Birth defects Maternal Grandmother        breast  . Breast cancer Maternal Grandmother   . Birth defects Paternal Grandmother        uterine, stomach, lung  . Breast cancer Paternal Grandmother    Social History   Socioeconomic History  . Marital  status: Married    Spouse name: Not on file  . Number of children: 2  . Years of education: Not on file  . Highest education level: Not on file  Occupational History  . 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 Financial resource strain: Not on file  . Food insecurity:    Worry: Not on file    Inability: Not on file  . Transportation needs:    Medical: Not on file    Non-medical: Not on file  Tobacco Use  . Smoking status: Never Smoker  . Smokeless tobacco: Never Used  Substance and Sexual Activity  . Alcohol use: Yes    Alcohol/week: 0.0 oz    Comment: 01/24/2013 "drink or 2 once or /twice/year, if that"  . Drug use: No  . Sexual activity: Not Currently  Lifestyle  . Physical activity:    Days per week: Not on file    Minutes per session: Not on file  . Stress: Not on file  Relationships  . Social connections:    Talks on phone: Not on file    Gets together: Not on file    Attends religious service: Not on file    Active member of club or organization: Not on file    Attends meetings of clubs or organizations: Not on file    Relationship status: Not on file  Other Topics Concern  . Not on  file  Social History Narrative   No living will   Would want husband as POA--then sister, Gavin PoundDeborah   Would accept resuscitation attempts   Probably would not want tube feeds if cognitively unaware   Past Surgical History:  Procedure Laterality Date  . APPENDECTOMY  ~ 06/2007  . BLADDER REPAIR  ~ 06/2007   "same day after bladder lift" (01/24/2013)  . BLADDER SUSPENSION  ~ 06/2007  . BREAST BIOPSY Left   . DIAGNOSTIC LAPAROSCOPY  1990's & ~ 2000   "I've had a couple; for endometrosis" (01/24/2013)  . HERNIA REPAIR    . INCISIONAL HERNIA REPAIR N/A 01/24/2013   Procedure: LAPAROSCOPIC INCISIONAL HERNIA;  Surgeon: Shelly Rubensteinouglas A Blackman, MD;  [facility];  Service: General;  Laterality: N/A;  . INSERTION OF MESH N/A 01/24/2013   Procedure: INSERTION OF MESH;  Surgeon: Shelly Rubensteinouglas A  Blackman, MD;  Location: [facility] OR;  Service: General;  Laterality: N/A;  . LAPAROSCOPIC INCISIONAL / UMBILICAL / [place]TR[place] HERNIA REPAIR  01/24/2013   IHR w/mesh/notes  . NASAL SEPTUM SURGERY  1980's?  . OOPHORECTOMY Right 2009  . TONSILLECTOMY  1990's  . VAGINAL HYSTERECTOMY  ` 06/2007   Past Medical History:  Diagnosis Date  . Allergy   . Anxiety   . Arthritis    "just the norm" (01/24/2013)  . ASCVD (arteriosclerotic cardiovascular disease)   . Asthma   . Daily headache    "depends on the season" (01/24/2013)  . Fibromyalgia   . GERD (gastroesophageal reflux disease)   . History of colonic polyps   . Hyperlipidemia   . Hypertension   . Migraines   . Obesity (BMI 30.0-34.9)   . RSD (reflex sympathetic dystrophy)   . Sleep apnea    NO MACHINE RECOMMENDED  . TIA (transient ischemic attack) ~ 2009   BP 123/82 (BP Location: Right Arm, Patient Position: Sitting, Cuff Size: Normal)   Pulse 74   Resp 14   Ht 5\' 7"  (1.702 m)   Wt 174 lb (78.9 kg)   SpO2 93%   BMI 27.25 kg/m   Opioid Risk Score:   Fall Risk Score:  `1  Depression screen PHQ 2/9  [facility] 02/06/2018 01/09/2018 10/04/2017 09/05/2017 08/05/2017 04/04/2017 10/21/2016  Decreased Interest 1 1 1  0 0 3 3  Down, Depressed, Hopeless 1 1 1  0 0 2 2  PHQ - 2 Score 2 2 2  0 0 5 5  Altered sleeping 0 - - - - - -  Tired, decreased energy 1 - - - - - -  Change in appetite 0 - - - - - -  Feeling bad or failure about yourself  0 - - - - - -  Trouble concentrating 0 - - - - - -  Moving slowly or fidgety/restless 0 - - - - - -  Suicidal thoughts 0 - - - - - -  PHQ-9 Score 3 - - - - - -  Difficult doing work/chores Not difficult at all - - - - - -  Some recent data might be hidden    Review of Systems  Constitutional: Negative.   HENT: Negative.   Eyes: Negative.   Respiratory: Negative.   Cardiovascular: Negative.   Gastrointestinal: Negative.   Endocrine: Negative.   Genitourinary: Negative.     Musculoskeletal: Positive for arthralgias, back pain, gait problem, neck pain and neck stiffness.       Spasms   Allergic/Immunologic: Negative.   Neurological: Positive for dizziness,  weakness and numbness.       Tingling   Hematological: Negative.   Psychiatric/Behavioral: Positive for dysphoric mood. The patient is nervous/anxious.        Objective:   Physical Exam  Constitutional: She is oriented to person, place, and time. She appears well-developed and well-nourished.  HENT:  Head: Normocephalic and atraumatic.  Neck: Normal range of motion. Neck supple.  Cardiovascular: Normal rate and regular rhythm.  Pulmonary/Chest: Effort normal and breath sounds normal.  Musculoskeletal:  Normal Muscle Bulk and Muscle Testing Reveals: Upper Extremities: Right:Full ROM and Muscle Strength 5/5 Left: Decreased ROM 90 Degrees and Muscle Strength 3/5 Thoracic Hypersensitivity Lower Extremities: Full ROM and Muscle Strength 5/5 Arises from Table Slowly Antalgic gait  Neurological: She is alert and oriented to person, place, and time.  Skin: Skin is warm and dry.  Psychiatric: She has a normal mood and affect.  Nursing note and vitals reviewed.         Assessment & Plan:  1.Complex Regional Pain Syndrome Type 1: Continue Topamax and Cymbalta. 04/06/2018 Refilled:Hydro-codone 7.5/325mg  one tablet twice a day one in the morning and one at bedtime#60. Continue:Tramadol 100 mg ER daily #30 and Tramadol 50 mg one tablet by mouth BID. One tablet in the afternoon and one tablet in the evening. We will continue the opioid monitoring program, this consists of regular clinic visits, examinations, urine drug screen, pill counts as well as use of [place] Controlled Substance Reporting System. 2. Depression: Continue current medication regimen with Cymbalta. 04/06/2018 3. Insomnia: Continue current medication regimen with Trazodone50 mg HS. 04/06/2018 4. Muscle Spasms: Continue  current medication regimen with Tizanidine. 04/06/2018 5. Chronic Pain Syndrome: Continue Ibuprofen. 04/06/2018  20 minutes of face to face patient care time was spent during this visit. All questions were encouraged and answered.  F/U in 1 month

## 2018-04-16 IMAGING — MG 2D DIGITAL SCREENING BILATERAL MAMMOGRAM WITH CAD AND ADJUNCT TO
8 of 12 series · 8 of 28 positions shown · non-contrast
Comparison: Previous exam(s).

CLINICAL DATA: Screening.

EXAM:
2D DIGITAL SCREENING BILATERAL MAMMOGRAM WITH CAD AND ADJUNCT TOMO

[L CC synth-2D]
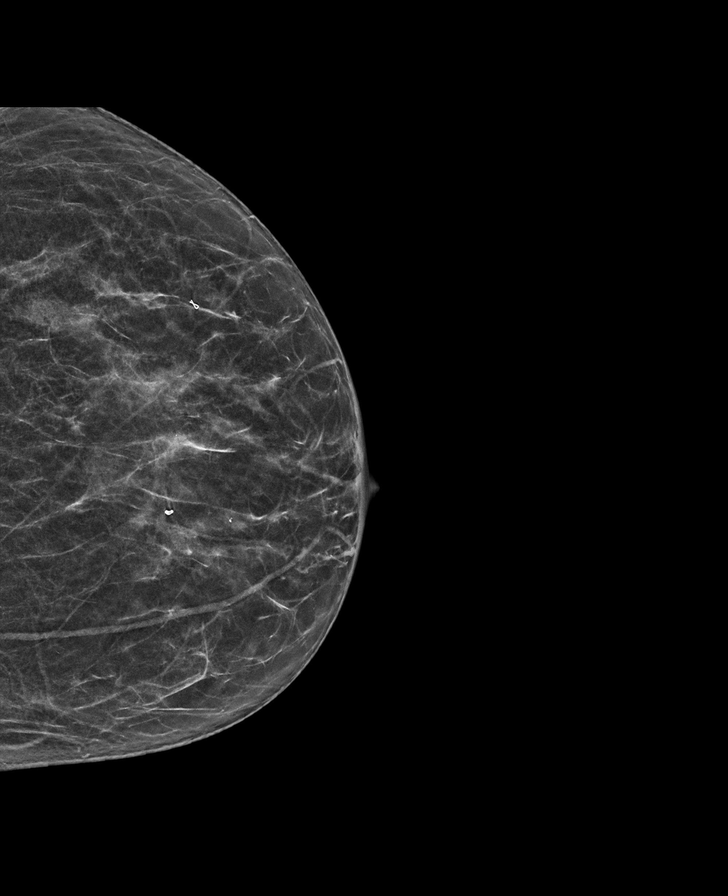

[R CC synth-2D]
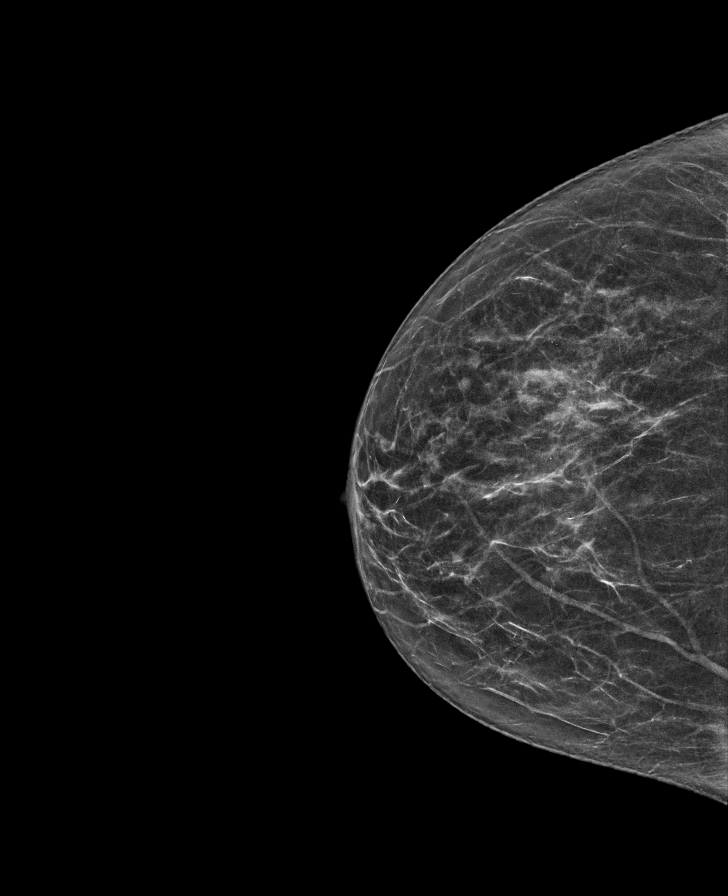

[R MLO synth-2D]
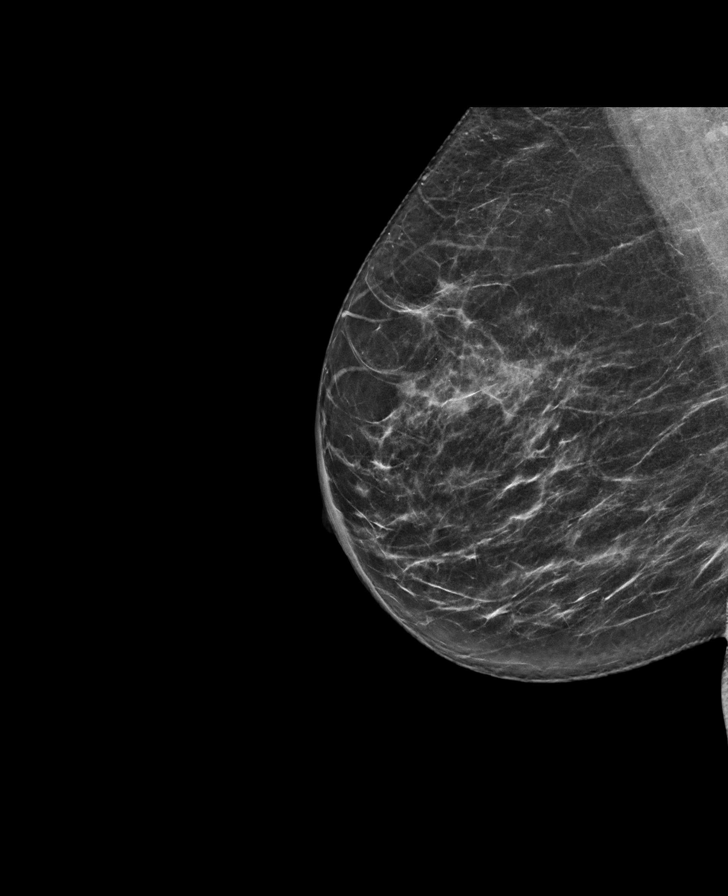

[L MLO synth-2D]
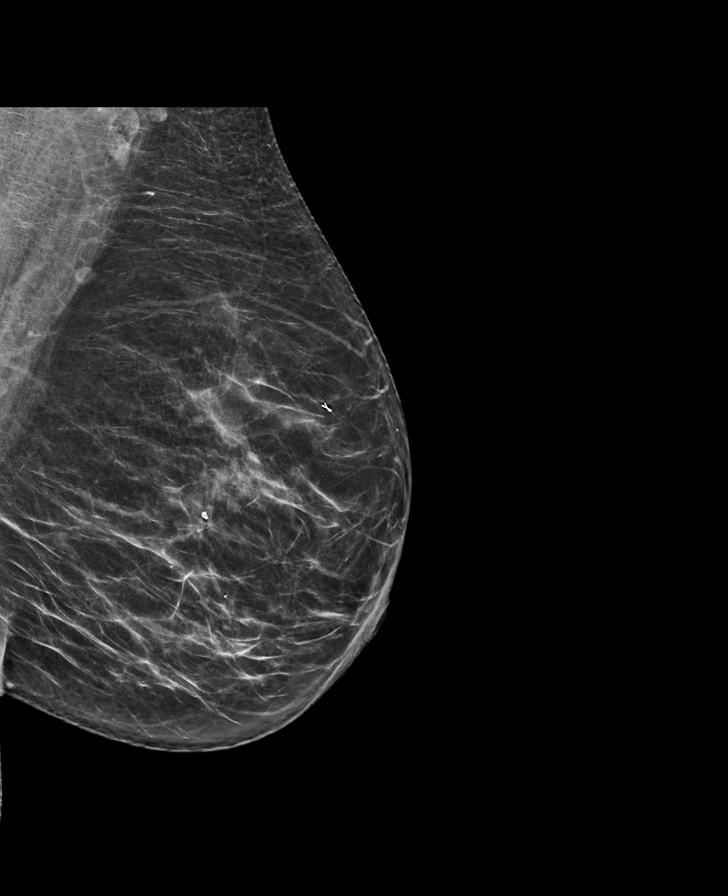

[R MLO]
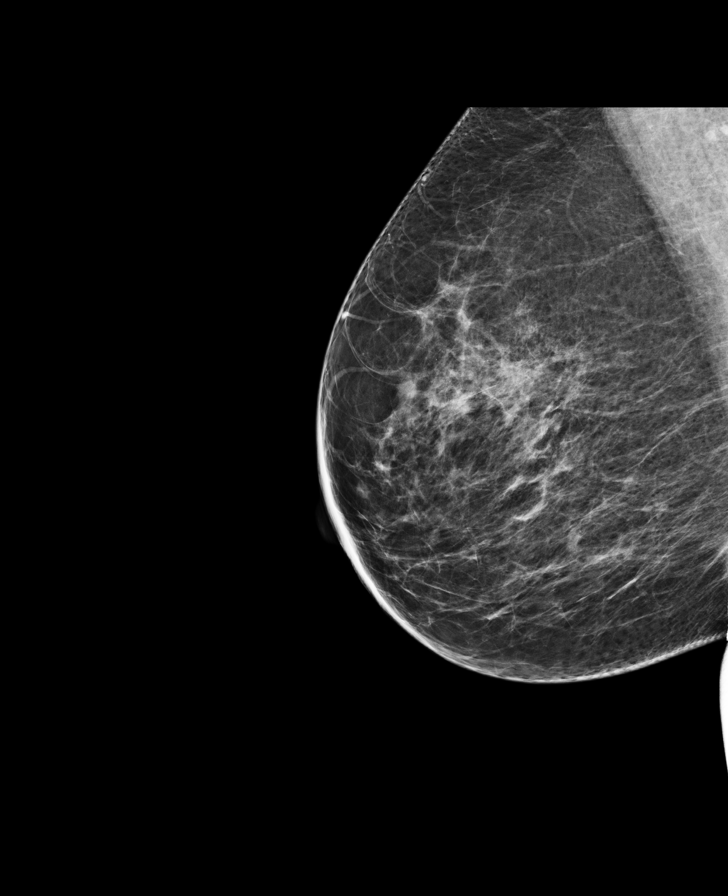

[R CC]
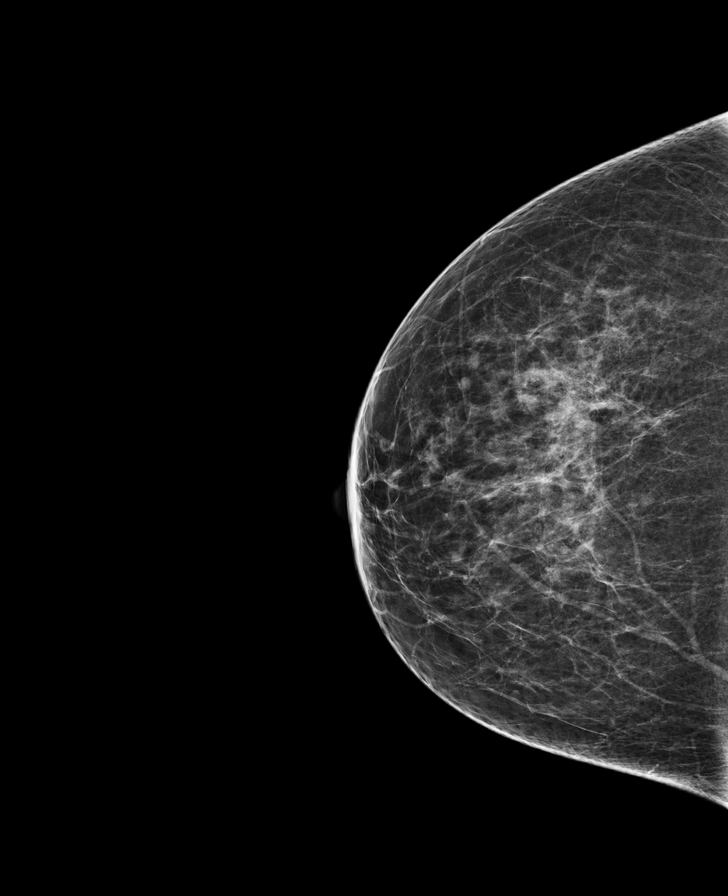

[L CC]
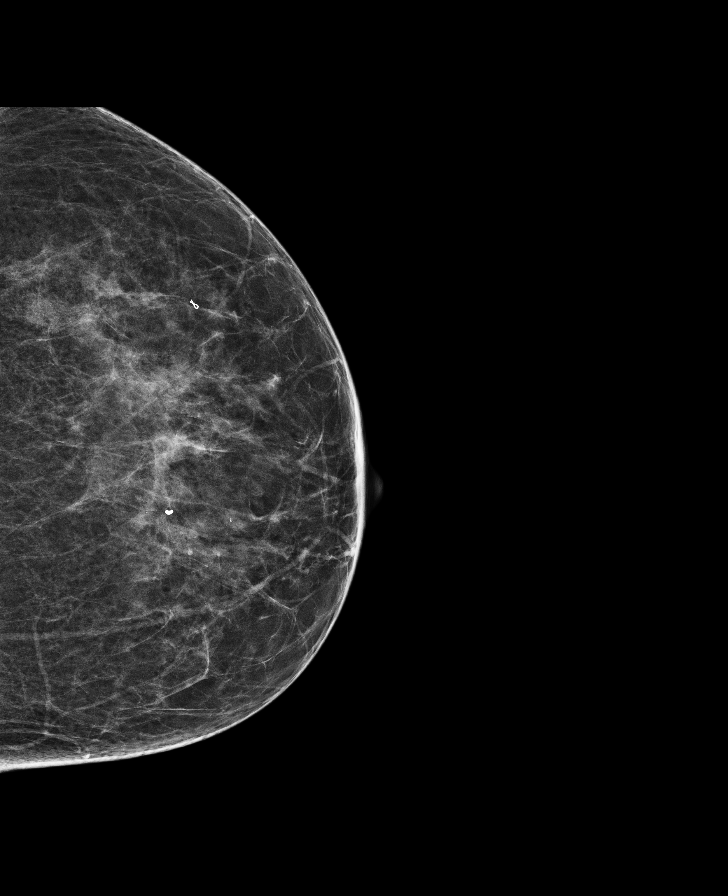

[L MLO]
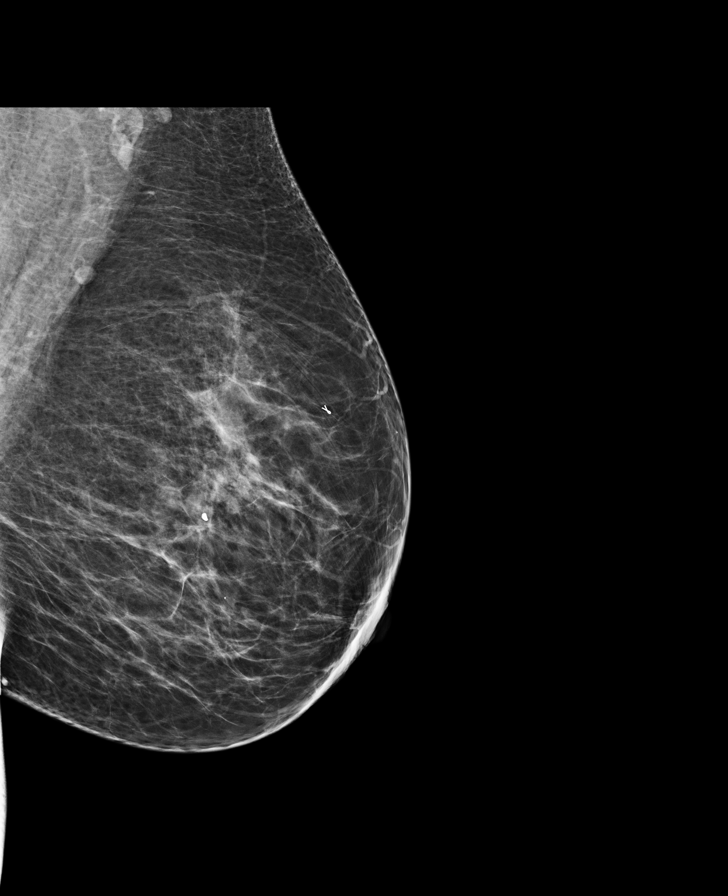

[8 of 28 positions shown; findings below may reference images not displayed]

ACR Breast Density Category c: The breast tissue is heterogeneously
dense, which may obscure small masses.
FINDINGS: There are no findings suspicious for malignancy. Images were
processed with CAD.
IMPRESSION: No mammographic evidence of malignancy. A result letter of this
screening mammogram will be mailed directly to the patient.

RECOMMENDATION:
Screening mammogram in one year. (Code:[id number])

BI-RADS CATEGORY  1: Negative.

## 2018-05-08 ENCOUNTER — Encounter: Payer: Self-pay | Admitting: Internal Medicine

## 2018-05-09 ENCOUNTER — Encounter: Payer: Self-pay | Admitting: Registered Nurse

## 2018-05-09 ENCOUNTER — Encounter: Payer: Worker's Compensation | Attending: Registered Nurse | Admitting: Registered Nurse

## 2018-05-09 ENCOUNTER — Other Ambulatory Visit: Payer: Self-pay

## 2018-05-09 VITALS — BP 131/79 | HR 75 | Ht 67.0 in | Wt 177.0 lb

## 2018-05-09 DIAGNOSIS — R42 Dizziness and giddiness: Secondary | ICD-10-CM | POA: Insufficient documentation

## 2018-05-09 DIAGNOSIS — M7591 Shoulder lesion, unspecified, right shoulder: Secondary | ICD-10-CM | POA: Diagnosis not present

## 2018-05-09 DIAGNOSIS — Z79899 Other long term (current) drug therapy: Secondary | ICD-10-CM | POA: Diagnosis not present

## 2018-05-09 DIAGNOSIS — I2510 Atherosclerotic heart disease of native coronary artery without angina pectoris: Secondary | ICD-10-CM | POA: Diagnosis not present

## 2018-05-09 DIAGNOSIS — Z760 Encounter for issue of repeat prescription: Secondary | ICD-10-CM | POA: Insufficient documentation

## 2018-05-09 DIAGNOSIS — W19XXXA Unspecified fall, initial encounter: Secondary | ICD-10-CM

## 2018-05-09 DIAGNOSIS — M62838 Other muscle spasm: Secondary | ICD-10-CM

## 2018-05-09 DIAGNOSIS — F3289 Other specified depressive episodes: Secondary | ICD-10-CM

## 2018-05-09 DIAGNOSIS — G4730 Sleep apnea, unspecified: Secondary | ICD-10-CM | POA: Diagnosis not present

## 2018-05-09 DIAGNOSIS — G90512 Complex regional pain syndrome I of left upper limb: Secondary | ICD-10-CM | POA: Diagnosis not present

## 2018-05-09 DIAGNOSIS — R200 Anesthesia of skin: Secondary | ICD-10-CM | POA: Insufficient documentation

## 2018-05-09 DIAGNOSIS — Z8673 Personal history of transient ischemic attack (TIA), and cerebral infarction without residual deficits: Secondary | ICD-10-CM | POA: Insufficient documentation

## 2018-05-09 DIAGNOSIS — J45909 Unspecified asthma, uncomplicated: Secondary | ICD-10-CM | POA: Diagnosis not present

## 2018-05-09 DIAGNOSIS — M797 Fibromyalgia: Secondary | ICD-10-CM | POA: Diagnosis not present

## 2018-05-09 DIAGNOSIS — G4700 Insomnia, unspecified: Secondary | ICD-10-CM

## 2018-05-09 DIAGNOSIS — G894 Chronic pain syndrome: Secondary | ICD-10-CM | POA: Diagnosis not present

## 2018-05-09 DIAGNOSIS — Z5181 Encounter for therapeutic drug level monitoring: Secondary | ICD-10-CM | POA: Diagnosis not present

## 2018-05-09 DIAGNOSIS — F329 Major depressive disorder, single episode, unspecified: Secondary | ICD-10-CM | POA: Diagnosis not present

## 2018-05-09 DIAGNOSIS — E785 Hyperlipidemia, unspecified: Secondary | ICD-10-CM | POA: Diagnosis not present

## 2018-05-09 DIAGNOSIS — F419 Anxiety disorder, unspecified: Secondary | ICD-10-CM | POA: Insufficient documentation

## 2018-05-09 DIAGNOSIS — K219 Gastro-esophageal reflux disease without esophagitis: Secondary | ICD-10-CM | POA: Insufficient documentation

## 2018-05-09 DIAGNOSIS — I10 Essential (primary) hypertension: Secondary | ICD-10-CM | POA: Diagnosis not present

## 2018-05-09 DIAGNOSIS — G9050 Complex regional pain syndrome I, unspecified: Secondary | ICD-10-CM | POA: Insufficient documentation

## 2018-05-09 DIAGNOSIS — M25511 Pain in right shoulder: Secondary | ICD-10-CM | POA: Insufficient documentation

## 2018-05-09 DIAGNOSIS — M778 Other enthesopathies, not elsewhere classified: Secondary | ICD-10-CM

## 2018-05-09 DIAGNOSIS — M7581 Other shoulder lesions, right shoulder: Secondary | ICD-10-CM

## 2018-05-09 MED ORDER — HYDROCODONE-ACETAMINOPHEN 7.5-325 MG PO TABS: ORAL_TABLET | ORAL | 0 refills | Status: DC

## 2018-05-09 MED ORDER — TRAMADOL HCL 50 MG PO TABS: ORAL_TABLET | ORAL | 2 refills | Status: DC

## 2018-05-09 MED ORDER — TRAMADOL HCL ER 100 MG PO TB24: 100.0000 mg | ORAL_TABLET | Freq: Every day | ORAL | 2 refills | Status: DC

## 2018-05-09 NOTE — Progress Notes (Signed)
Subjective:    Patient ID: [name], female    DOB: 21-Jan-1966, 52 y.o.   MRN: [id number]  HPI: [name] is a 52 year old female who returns for follow up appointment for chronic pain and medication refill. She states her pain is located in her left arm. She rates her pain 10. Her current exercise regime is walking.   [name] reports last week she was walking down her stairs in her home and she lost her balanced and fell down 7 steps and landed on her right shoulder and she reports she heard a popping sound. Her husband helped her up, sh didn't seek medical attention. Today she has right shoulder pain, we will order an x-ray.   [name] Morphine Equivalent is 35.00 MME. Last Oral Swab was Performed on 11/08/2017, it was consistent.   Pain Inventory Average Pain 8 Pain Right Now 10 My pain is sharp, burning, dull, stabbing, tingling and aching  In the last 24 hours, has pain interfered with the following? General activity 2 Relation with others 2 Enjoyment of life 2 What TIME of day is your pain at its worst? all Sleep (in general) Poor  Pain is worse with: walking, bending, sitting, standing and some activites Pain improves with: rest, heat/ice, therapy/exercise, pacing activities, medication and TENS Relief from Meds: 6  Mobility ability to climb steps?  yes do you drive?  yes  Function disabled: date disabled n/a I need assistance with the following:  meal prep, household duties and shopping  Neuro/Psych bladder control problems weakness numbness tremor tingling trouble walking spasms dizziness confusion depression anxiety loss of taste or smell  Prior Studies Any changes since last visit?  no  Physicians involved in your care Any changes since last visit?  no   Family History  Problem Relation Age of Onset  . Cancer Mother        lung  . Hypertension Mother   . Cancer Father        colon  . Hypertension Sister   .  Cancer Sister   . Cancer Brother   . Hyperlipidemia Sister   . Birth defects Maternal Grandmother        breast  . Breast cancer Maternal Grandmother   . Birth defects Paternal Grandmother        uterine, stomach, lung  . Breast cancer Paternal Grandmother    Social History   Socioeconomic History  . Marital status: Married    Spouse name: Not on file  . Number of children: 2  . Years of education: Not on file  . Highest education level: Not on file  Occupational History  . 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 Financial resource strain: Not on file  . Food insecurity:    Worry: Not on file    Inability: Not on file  . Transportation needs:    Medical: Not on file    Non-medical: Not on file  Tobacco Use  . Smoking status: Never Smoker  . Smokeless tobacco: Never Used  Substance and Sexual Activity  . Alcohol use: Yes    Alcohol/week: 0.0 oz    Comment: 01/24/2013 "drink or 2 once or /twice/year, if that"  . Drug use: No  . Sexual activity: Not Currently  Lifestyle  . Physical activity:    Days per week: Not on file    Minutes per session: Not on file  . Stress: Not on file  Relationships  .  Social connections:    Talks on phone: Not on file    Gets together: Not on file    Attends religious service: Not on file    Active member of club or organization: Not on file    Attends meetings of clubs or organizations: Not on file    Relationship status: Not on file  Other Topics Concern  . Not on file  Social History Narrative   No living will   Would want husband as POA--then sister, [name]   Would accept resuscitation attempts   Probably would not want tube feeds if cognitively unaware   Past Surgical History:  Procedure Laterality Date  . APPENDECTOMY  ~ 06/2007  . BLADDER REPAIR  ~ 06/2007   "same day after bladder lift" (01/24/2013)  . BLADDER SUSPENSION  ~ 06/2007  . BREAST BIOPSY Left   . DIAGNOSTIC LAPAROSCOPY  1990's & ~ 2000   "I've had a  couple; for endometrosis" (01/24/2013)  . HERNIA REPAIR    . INCISIONAL HERNIA REPAIR N/A 01/24/2013   Procedure: LAPAROSCOPIC INCISIONAL HERNIA;  Surgeon: Shelly Rubenstein, MD;  Location: [facility] OR;  Service: General;  Laterality: N/A;  . INSERTION OF MESH N/A 01/24/2013   Procedure: INSERTION OF MESH;  Surgeon: Shelly Rubenstein, MD;  Location: [facility] OR;  Service: General;  Laterality: N/A;  . LAPAROSCOPIC INCISIONAL / UMBILICAL / VENTRAL HERNIA REPAIR  01/24/2013   IHR w/mesh/notes  . NASAL SEPTUM SURGERY  1980's?  . OOPHORECTOMY Right 2009  . TONSILLECTOMY  1990's  . VAGINAL HYSTERECTOMY  ` 06/2007   Past Medical History:  Diagnosis Date  . Allergy   . Anxiety   . Arthritis    "just the norm" (01/24/2013)  . ASCVD (arteriosclerotic cardiovascular disease)   . Asthma   . Daily headache    "depends on the season" (01/24/2013)  . Fibromyalgia   . GERD (gastroesophageal reflux disease)   . History of colonic polyps   . Hyperlipidemia   . Hypertension   . Migraines   . Obesity (BMI 30.0-34.9)   . RSD (reflex sympathetic dystrophy)   . Sleep apnea    NO MACHINE RECOMMENDED  . TIA (transient ischemic attack) ~ 2009   BP 131/79   Pulse 75   Ht 5\' 7"  (1.702 m)   Wt 177 lb (80.3 kg)   SpO2 (!) 86%   BMI 27.72 kg/m   Opioid Risk Score:   Fall Risk Score:  `1  Depression screen PHQ 2/9  Depression screen [facility] 2/9 05/09/2018 02/06/2018 01/09/2018 10/04/2017 09/05/2017 08/05/2017 04/04/2017  Decreased Interest 1 1 1 1  0 0 3  Down, Depressed, Hopeless 1 1 1 1  0 0 2  PHQ - 2 Score 2 2 2 2  0 0 5  Altered sleeping - 0 - - - - -  Tired, decreased energy - 1 - - - - -  Change in appetite - 0 - - - - -  Feeling bad or failure about yourself  - 0 - - - - -  Trouble concentrating - 0 - - - - -  Moving slowly or fidgety/restless - 0 - - - - -  Suicidal thoughts - 0 - - - - -  PHQ-9 Score - 3 - - - - -  Difficult doing work/chores - Not difficult at all - - - - -  Some recent data might be hidden      Review of Systems  Constitutional: Negative.   HENT: Negative.  Eyes: Negative.   Respiratory: Negative.   Cardiovascular: Negative.   Gastrointestinal: Negative.   Endocrine: Negative.   Genitourinary: Negative.   Musculoskeletal: Negative.   Skin: Negative.   Allergic/Immunologic: Negative.   Neurological: Negative.   Hematological: Negative.   Psychiatric/Behavioral: Negative.        Objective:   Physical Exam  Constitutional: She is oriented to person, place, and time. She appears well-developed and well-nourished.  HENT:  Head: Normocephalic and atraumatic.  Neck: Normal range of motion. Neck supple.  Cardiovascular: Normal rate and regular rhythm.  Pulmonary/Chest: Effort normal and breath sounds normal.  Musculoskeletal:  Normal Muscle Bulk and Muscle Testing Reveals: Upper Extremities: Right: Full ROM and Muscle Strength 4/5 Right AC Joint Tenderness Left: Decreased ROM 90 Degrees and Muscle Strength 3/5 Thoracic and Lumbar Hypersensitivity Lower Extremities: Decreased ROM and Muscle Strength 4/5  Bilateral Lower Extremities Flexion Produces Pain into Bilateral Lower Extremities Arises from Table Slowly Antalgic Gait  Neurological: She is alert and oriented to person, place, and time.  Skin: Skin is warm and dry.  Psychiatric: She has a normal mood and affect. Her behavior is normal.  Nursing note and vitals reviewed.         Assessment & Plan:  1.Complex Regional Pain Syndrome Type 1: Continue Topamax and Cymbalta. 05/09/2018 Refilled:Hydro-codone 7.5/325mg  one tablet twice a day one in the morning and one at bedtime#60. Continue:Tramadol 100 mg ER daily #30 and Tramadol 50 mg one tablet by mouth BID. One tablet in the afternoon and one tablet in the evening. We will continue the opioid monitoring program, this consists of regular clinic visits, examinations, urine drug screen, pill counts as well as use of [place] Cont[place]  Reporting System. 2. Depression: Continuecurrent medication regimen withCymbalta. 05/09/2018 3. Insomnia: Continuecurrent medication regimen withTrazodone50 mg HS. 05/09/2018 4. Muscle Spasms: Continuecurrent medication regimen withTizanidine. 05/09/2018 5. Chronic Pain Syndrome: Continue Ibuprofen. 05/09/2018 6. Right Shoulder Tendonitis/ Fall: RX: X-ray. Educated on [employer] Prevention.   20 minutes of face to face patient care time was spent during this visit. All questions were encouraged and answered.  F/U in 1 month

## 2018-05-19 ENCOUNTER — Encounter: Payer: Self-pay | Admitting: Internal Medicine

## 2018-05-19 ENCOUNTER — Ambulatory Visit (INDEPENDENT_AMBULATORY_CARE_PROVIDER_SITE_OTHER): Payer: Medicare HMO | Admitting: Internal Medicine

## 2018-05-19 VITALS — BP 116/70 | HR 75 | Temp 98.1°F | Ht 67.75 in | Wt 175.0 lb

## 2018-05-19 DIAGNOSIS — G90529 Complex regional pain syndrome I of unspecified lower limb: Secondary | ICD-10-CM

## 2018-05-19 DIAGNOSIS — R69 Illness, unspecified: Secondary | ICD-10-CM | POA: Diagnosis not present

## 2018-05-19 DIAGNOSIS — K900 Celiac disease: Secondary | ICD-10-CM | POA: Diagnosis not present

## 2018-05-19 DIAGNOSIS — J4520 Mild intermittent asthma, uncomplicated: Secondary | ICD-10-CM

## 2018-05-19 DIAGNOSIS — Z0000 Encounter for general adult medical examination without abnormal findings: Secondary | ICD-10-CM | POA: Diagnosis not present

## 2018-05-19 DIAGNOSIS — I10 Essential (primary) hypertension: Secondary | ICD-10-CM

## 2018-05-19 DIAGNOSIS — F39 Unspecified mood [affective] disorder: Secondary | ICD-10-CM | POA: Diagnosis not present

## 2018-05-19 LAB — COMPREHENSIVE METABOLIC PANEL
ALBUMIN: 4.7 g/dL (ref 3.5–5.2)
ALK PHOS: 100 U/L (ref 39–117)
ALT: 16 U/L (ref 0–35)
AST: 16 U/L (ref 0–37)
BILIRUBIN TOTAL: 0.4 mg/dL (ref 0.2–1.2)
BUN: 13 mg/dL (ref 6–23)
CO2: 25 mEq/L (ref 19–32)
CREATININE: 1 mg/dL (ref 0.40–1.20)
Calcium: 9.8 mg/dL (ref 8.4–10.5)
Chloride: 107 mEq/L (ref 96–112)
GFR: 61.37 mL/min (ref >60.00)
Glucose, Bld: 121 mg/dL — ABNORMAL HIGH (ref 70–99)
Potassium: 4 mEq/L (ref 3.5–5.1)
Sodium: 141 mEq/L (ref 135–145)
TOTAL PROTEIN: 7.3 g/dL (ref 6.0–8.3)

## 2018-05-19 LAB — CBC
HEMATOCRIT: 41.5 % (ref 36.0–46.0)
HEMOGLOBIN: 13.8 g/dL (ref 12.0–15.0)
MCHC: 33.2 g/dL (ref 30.0–36.0)
MCV: 87.5 fl (ref 78.0–100.0)
Platelets: 247 10*3/uL (ref 150.0–400.0)
RBC: 4.75 Mil/uL (ref 3.87–5.11)
RDW: 13.8 % (ref 11.5–15.5)
WBC: 8.5 10*3/uL (ref 4.0–10.5)

## 2018-05-19 MED ORDER — MONTELUKAST SODIUM 10 MG PO TABS: 10.0000 mg | ORAL_TABLET | Freq: Every day | ORAL | 3 refills | Status: DC

## 2018-05-19 NOTE — Assessment & Plan Note (Signed)
BP Readings from Last 3 Encounters:  05/19/18 116/70  05/09/18 131/79  04/06/18 123/82   Good control  Due for labs

## 2018-05-19 NOTE — Assessment & Plan Note (Signed)
Continues on complex pain regimen

## 2018-05-19 NOTE — Progress Notes (Signed)
Hearing Screening   Method: Audiometry   125Hz  250Hz  500Hz  1000Hz  2000Hz  3000Hz  4000Hz  6000Hz  8000Hz   Right ear:   0 0 20  20    Left ear:   25 0 20  20      Visual Acuity Screening   Right eye Left eye Both eyes  Without correction: 20/30 20/30 20/25   With correction:

## 2018-05-19 NOTE — Assessment & Plan Note (Signed)
Chronic depression Fair control with medication

## 2018-05-19 NOTE — Assessment & Plan Note (Signed)
I have personally reviewed the Medicare Annual Wellness questionnaire and have noted 1. The patient's medical and social history 2. Their use of alcohol, tobacco or illicit drugs 3. Their current medications and supplements 4. The patient's functional ability including ADL's, fall risks, home safety risks and hearing or visual             impairment. 5. Diet and physical activities 6. Evidence for depression or mood disorders  The patients weight, height, BMI and visual acuity have been recorded in the chart I have made referrals, counseling and provided education to the patient based review of the above and I have provided the pt with a written personalized care plan for preventive services.  I have provided you with a copy of your personalized plan for preventive services. Please take the time to review along with your updated medication list.  Yearly flu vaccine Colon last year---needs repeat 2021 (will try to get the records) mammo yearly due to +FH Discussed fitness No pap due to hyster

## 2018-05-19 NOTE — Assessment & Plan Note (Signed)
New diagnosis Tries to be gluten free Repeat EGD planned

## 2018-05-19 NOTE — Assessment & Plan Note (Addendum)
Fairly controlled Some cough (from post nasal drip) Rare wheezing Consider montelukast

## 2018-05-19 NOTE — Progress Notes (Signed)
Subjective:    Patient ID: [name], female    DOB: 07/05/1966, 52 y.o.   MRN: [id number]  HPI Here for Medicare wellness visit and follow up of chronic health conditions Reviewed advanced directives Other care providers---[facility] (PMR), Noe [facility] (GI). Had not yet seen Dr Juanetta SnowLeggett-gyn (but probably doesn't need to), Dr Veneta PentonNovak (dentist), [facility]) No tobacco or alcohol No hospital or ER visits in past year Vision is fair. Hearing is reasonable Has been doing some swimming ---can tolerate that Larey SeatFell a couple of weeks ago---right shoulder injury Chronic mood problems Husband helps with housework and shopping. Does all ADLs Drives locally Mild memory issues--no progression (pain, etc related)  Diagnosed with celiac disease on last year's EGD Now mostly gluten free Some ulcers as well---on PPI and reglan Due for repeat EGD soon  Mood is okay Chronic depression ---not worse Does find things she enjoys Sleep is still not great--considering increasing the trazodone  Gets pain under left breast at times ??gas---sharp and stabbing Not exertional Some SOB when swimming--- may be her fitness level Some swelling from the RSD No palpitations Some dizziness--not new. No syncope  Current Outpatient Medications on File Prior to Visit  Medication Sig Dispense Refill  . albuterol (PROVENTIL HFA;VENTOLIN HFA) 108 (90 Base) MCG/ACT inhaler Inhale 2 puffs into the lungs every 6 (six) hours as needed for wheezing or shortness of breath. 1 Inhaler 5  . clobetasol cream (TEMOVATE) 0.05 % Apply 1 application topically 2 (two) times daily. 30 g 0  . CYMBALTA 30 MG capsule Take 3 capsules (90 mg total) by mouth daily. 270 capsule 2  . esomeprazole (NEXIUM) 40 MG capsule TAKE 1 CAPSULE (40 MG TOTAL) BY MOUTH 2 (TWO) TIMES DAILY. QTY/DAY SUPPLY PER INS 60 capsule 11  . HYDROcodone-acetaminophen (NORCO) 7.5-325 MG tablet TAKE 1 TABLET Twice a Day. One in the Morning and one  at Bedtime 60 tablet 0  . ibuprofen (ADVIL,MOTRIN) 600 MG tablet TAKE 1 TABLET (600 MG TOTAL) BY MOUTH 3 (THREE) TIMES DAILY AS NEEDED. 90 tablet 8  . lidocaine (LIDODERM) 5 % MAY USE UP TO 3 PATCHES DAILY. REMOVE AND DISCARED PATCHES AFTER 12 HOURS OR AS DIRECTED 90 patch 4  . loratadine (CLARITIN) 10 MG tablet Take 10 mg by mouth daily.      Marland Kitchen. losartan (COZAAR) 100 MG tablet Take 1 tablet (100 mg total) by mouth daily. 90 tablet 3  . metoCLOPramide (REGLAN) 10 MG tablet Take 10 mg by mouth 2 (two) times daily.    . potassium chloride SA (K-DUR,KLOR-CON) 20 MEQ tablet Take 1 tablet (20 mEq total) by mouth 2 (two) times daily. 60 tablet 1  . tiZANidine (ZANAFLEX) 4 MG tablet TAKE 1 TABLET (4 MG TOTAL) BY MOUTH 4 (FOUR) TIMES DAILY. 120 tablet 3  . topiramate (TOPAMAX) 100 MG tablet TAKE 1 TABLET BY MOUTH EVERY MORNING AND 2 TABLETS BY MOUTH EVERY EVENING 90 tablet 4  . traMADol (ULTRAM) 50 MG tablet TAKE 1 TABLET BY MOUTH TWICE A DAY. DIRECTION CHANGE 60 tablet 2  . traMADol (ULTRAM-ER) 100 MG 24 hr tablet Take 1 tablet (100 mg total) by mouth daily. 30 tablet 2  . traZODone (DESYREL) 50 MG tablet TAKE 1 TABLET BY MOUTH EVERYDAY AT BEDTIME 30 tablet 2   No current facility-administered medications on file prior to visit.     Allergies  Allergen Reactions  . Demerol [Meperidine] Anaphylaxis, Itching and Swelling  . Cymbalta [Duloxetine Hcl] Hives    Generic  makes her feel like she is crawling out of her skin  . Diazepam     REACTION: swelling  . Meperidine Hcl     REACTION: swelling  . Other     Axe Cologne    Past Medical History:  Diagnosis Date  . Allergy   . Anxiety   . Arthritis    "just the norm" (01/24/2013)  . ASCVD (arteriosclerotic cardiovascular disease)   . Asthma   . Celiac disease   . Daily headache    "depends on the season" (01/24/2013)  . Fibromyalgia   . GERD (gastroesophageal reflux disease)   . History of colonic polyps   . Hyperlipidemia   . Hypertension     . Migraines   . Obesity (BMI 30.0-34.9)   . RSD (reflex sympathetic dystrophy)   . Sleep apnea    NO MACHINE RECOMMENDED  . TIA (transient ischemic attack) ~ 2009    Past Surgical History:  Procedure Laterality Date  . APPENDECTOMY  ~ 06/2007  . BLADDER REPAIR  ~ 06/2007   "same day after bladder lift" (01/24/2013)  . BLADDER SUSPENSION  ~ 06/2007  . BREAST BIOPSY Left   . DIAGNOSTIC LAPAROSCOPY  1990's & ~ 2000   "I've had a couple; for endometrosis" (01/24/2013)  . HERNIA REPAIR    . INCISIONAL HERNIA REPAIR N/A 01/24/2013   Procedure: LAPAROSCOPIC INCISIONAL HERNIA;  Surgeon: Shelly Rubenstein, MD;  Location: [facility] OR;  Service: General;  Laterality: N/A;  . INSERTION OF MESH N/A 01/24/2013   Procedure: INSERTION OF MESH;  Surgeon: Shelly Rubenstein, MD;  Location: [facility] OR;  Service: General;  Laterality: N/A;  . LAPAROSCOPIC INCISIONAL / UMBILICAL / VENTRAL HERNIA REPAIR  01/24/2013   IHR w/mesh/notes  . NASAL SEPTUM SURGERY  1980's?  . OOPHORECTOMY Right 2009  . TONSILLECTOMY  1990's  . VAGINAL HYSTERECTOMY  ` 06/2007    Family History  Problem Relation Age of Onset  . Cancer Mother        lung  . Hypertension Mother   . Cancer Father        colon  . Hypertension Sister   . Cancer Sister   . Cancer Brother   . Hyperlipidemia Sister   . Birth defects Maternal Grandmother        breast  . Breast cancer Maternal Grandmother   . Birth defects Paternal Grandmother        uterine, stomach, lung  . Breast cancer Paternal Grandmother     Social History   Socioeconomic History  . Marital status: Married    Spouse name: Not on file  . Number of children: 2  . Years of education: Not on file  . Highest education level: Not on file  Occupational History  . 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 Financial resource strain: Not on file  . Food insecurity:    Worry: Not on file    Inability: Not on file  . Transportation needs:    Medical: Not on file    Non-medical:  Not on file  Tobacco Use  . Smoking status: Never Smoker  . Smokeless tobacco: Never Used  Substance and Sexual Activity  . Alcohol use: Yes    Alcohol/week: 0.0 oz    Comment: 01/24/2013 "drink or 2 once or /twice/year, if that"  . Drug use: No  . Sexual activity: Not Currently  Lifestyle  . Physical activity:    Days per week: Not on file  Minutes per session: Not on file  . Stress: Not on file  Relationships  . Social connections:    Talks on phone: Not on file    Gets together: Not on file    Attends religious service: Not on file    Active member of club or organization: Not on file    Attends meetings of clubs or organizations: Not on file    Relationship status: Not on file  . Intimate partner violence:    Fear of current or ex partner: Not on file    Emotionally abused: Not on file    Physically abused: Not on file    Forced sexual activity: Not on file  Other Topics Concern  . Not on file  Social History Narrative   No living will   Would want husband as POA--then sister, [name]   Would accept resuscitation attempts   Probably would not want tube feeds if cognitively unaware   Review of Systems Had EGD and colonoscopy last year Repeat colon in 3 years for polyps Appetite is okay Weight is stable Wears seat belt Teeth okay---keeps up with dentist Heartburn controlled with the PPI Pain is stable---continues on complex regimen    Objective:   Physical Exam  Constitutional: She is oriented to person, place, and time. She appears well-developed. No distress.  HENT:  Mouth/Throat: Oropharynx is clear and moist. No oropharyngeal exudate.  Neck: No thyromegaly present.  Cardiovascular: Normal rate, regular rhythm, normal heart sounds and intact distal pulses. Exam reveals no gallop.  No murmur heard. Respiratory: Effort normal and breath sounds normal. No respiratory distress. She has no wheezes. She has no rales.  GI: Soft. There is no tenderness.    Musculoskeletal: She exhibits no edema.  Fair ROM right shoulder. No striking tenderness (discussed likely bicipital tendonitis)  Lymphadenopathy:    She has no cervical adenopathy.  Neurological: She is alert and oriented to person, place, and time.  President--- "What's his face with the casinos and hotels, Obama, ?" [id number] Recall 2/3 (cup instead of glass)  Skin: No rash noted. No erythema.  Psychiatric: She has a normal mood and affect. Her behavior is normal.           Assessment & Plan:

## 2018-06-07 ENCOUNTER — Other Ambulatory Visit: Payer: Self-pay | Admitting: Internal Medicine

## 2018-06-09 ENCOUNTER — Encounter: Payer: Worker's Compensation | Attending: Registered Nurse | Admitting: Registered Nurse

## 2018-06-09 ENCOUNTER — Encounter: Payer: Self-pay | Admitting: Registered Nurse

## 2018-06-09 VITALS — BP 123/79 | HR 75 | Ht 67.0 in | Wt 176.8 lb

## 2018-06-09 DIAGNOSIS — M79602 Pain in left arm: Secondary | ICD-10-CM | POA: Insufficient documentation

## 2018-06-09 DIAGNOSIS — Z8673 Personal history of transient ischemic attack (TIA), and cerebral infarction without residual deficits: Secondary | ICD-10-CM | POA: Diagnosis not present

## 2018-06-09 DIAGNOSIS — M62838 Other muscle spasm: Secondary | ICD-10-CM | POA: Diagnosis not present

## 2018-06-09 DIAGNOSIS — E669 Obesity, unspecified: Secondary | ICD-10-CM | POA: Diagnosis not present

## 2018-06-09 DIAGNOSIS — I10 Essential (primary) hypertension: Secondary | ICD-10-CM | POA: Diagnosis not present

## 2018-06-09 DIAGNOSIS — Z9889 Other specified postprocedural states: Secondary | ICD-10-CM | POA: Insufficient documentation

## 2018-06-09 DIAGNOSIS — F329 Major depressive disorder, single episode, unspecified: Secondary | ICD-10-CM | POA: Insufficient documentation

## 2018-06-09 DIAGNOSIS — M7581 Other shoulder lesions, right shoulder: Secondary | ICD-10-CM

## 2018-06-09 DIAGNOSIS — G9050 Complex regional pain syndrome I, unspecified: Secondary | ICD-10-CM | POA: Insufficient documentation

## 2018-06-09 DIAGNOSIS — Z6830 Body mass index (BMI) 30.0-30.9, adult: Secondary | ICD-10-CM | POA: Diagnosis not present

## 2018-06-09 DIAGNOSIS — G90512 Complex regional pain syndrome I of left upper limb: Secondary | ICD-10-CM | POA: Diagnosis not present

## 2018-06-09 DIAGNOSIS — G894 Chronic pain syndrome: Secondary | ICD-10-CM | POA: Diagnosis not present

## 2018-06-09 DIAGNOSIS — Z90710 Acquired absence of both cervix and uterus: Secondary | ICD-10-CM | POA: Insufficient documentation

## 2018-06-09 DIAGNOSIS — Z5181 Encounter for therapeutic drug level monitoring: Secondary | ICD-10-CM

## 2018-06-09 DIAGNOSIS — Z79899 Other long term (current) drug therapy: Secondary | ICD-10-CM | POA: Diagnosis not present

## 2018-06-09 DIAGNOSIS — G4700 Insomnia, unspecified: Secondary | ICD-10-CM | POA: Diagnosis not present

## 2018-06-09 DIAGNOSIS — Z760 Encounter for issue of repeat prescription: Secondary | ICD-10-CM | POA: Diagnosis present

## 2018-06-09 DIAGNOSIS — F3289 Other specified depressive episodes: Secondary | ICD-10-CM

## 2018-06-09 DIAGNOSIS — M778 Other enthesopathies, not elsewhere classified: Secondary | ICD-10-CM

## 2018-06-09 MED ORDER — TOPIRAMATE 100 MG PO TABS: ORAL_TABLET | ORAL | 4 refills | Status: DC

## 2018-06-09 MED ORDER — HYDROCODONE-ACETAMINOPHEN 7.5-325 MG PO TABS: ORAL_TABLET | ORAL | 0 refills | Status: DC

## 2018-06-09 NOTE — Progress Notes (Signed)
Subjective:    Patient ID: [name], female    DOB: 06/16/1966, 52 y.o.   MRN: [id number]  HPI: Ms. Jaynee EaglesLaura Lyons- Eli PhillipsGiarraputo is a 52 year old female who returns for follow up appointment for chronic pain and medication refill. She states her pain is located in her right shoulder and left arm. She rates her pain 7. Her current exercise regime is walking.   [name] Morphine Equivalent is 35.00 MME. Last Oral Swab was Performed on 11/08/2017, it was consistent.   Pain Inventory Average Pain 7 Pain Right Now 7 My pain is sharp, burning, stabbing, tingling and aching  In the last 24 hours, has pain interfered with the following? General activity 3 Relation with others 3 Enjoyment of life 3 What TIME of day is your pain at its worst? all Sleep (in general) Poor  Pain is worse with: walking, bending, sitting, inactivity and standing Pain improves with: rest, heat/ice, therapy/exercise, pacing activities, medication, TENS and injections Relief from Meds: 6  Mobility do you drive?  yes  Function disabled: date disabled 2014 I need assistance with the following:  meal prep, household duties and shopping  Neuro/Psych bladder control problems weakness numbness tremor tingling trouble walking spasms dizziness confusion depression anxiety loss of taste or smell  Prior Studies Any changes since last visit?  no  Physicians involved in your care Any changes since last visit?  no   Family History  Problem Relation Age of Onset  . Cancer Mother        lung  . Hypertension Mother   . Cancer Father        colon  . Hypertension Sister   . Cancer Sister   . Cancer Brother   . Hyperlipidemia Sister   . Birth defects Maternal Grandmother        breast  . Breast cancer Maternal Grandmother   . Birth defects Paternal Grandmother        uterine, stomach, lung  . Breast cancer Paternal Grandmother    Social History   Socioeconomic History  . Marital  status: Married    Spouse name: Not on file  . Number of children: 2  . Years of education: Not on file  . Highest education level: Not on file  Occupational History  . 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 Financial resource strain: Not on file  . Food insecurity:    Worry: Not on file    Inability: Not on file  . Transportation needs:    Medical: Not on file    Non-medical: Not on file  Tobacco Use  . Smoking status: Never Smoker  . Smokeless tobacco: Never Used  Substance and Sexual Activity  . Alcohol use: Yes    Alcohol/week: 0.0 standard drinks    Comment: 01/24/2013 "drink or 2 once or /twice/year, if that"  . Drug use: No  . Sexual activity: Not Currently  Lifestyle  . Physical activity:    Days per week: Not on file    Minutes per session: Not on file  . Stress: Not on file  Relationships  . Social connections:    Talks on phone: Not on file    Gets together: Not on file    Attends religious service: Not on file    Active member of club or organization: Not on file    Attends meetings of clubs or organizations: Not on file    Relationship status: Not on file  Other Topics  Concern  . Not on file  Social History Narrative   No living will   Would want husband as POA--then sister, [name]   Would accept resuscitation attempts   Probably would not want tube feeds if cognitively unaware   Past Surgical History:  Procedure Laterality Date  . APPENDECTOMY  ~ 06/2007  . BLADDER REPAIR  ~ 06/2007   "same day after bladder lift" (01/24/2013)  . BLADDER SUSPENSION  ~ 06/2007  . BREAST BIOPSY Left   . DIAGNOSTIC LAPAROSCOPY  1990's & ~ 2000   "I've had a couple; for endometrosis" (01/24/2013)  . HERNIA REPAIR    . INCISIONAL HERNIA REPAIR N/A 01/24/2013   Procedure: LAPAROSCOPIC INCISIONAL HERNIA;  Surgeon: Shelly Rubenstein, MD;  Location: [facility] OR;  Service: General;  Laterality: N/A;  . INSERTION OF MESH N/A 01/24/2013   Procedure: INSERTION OF MESH;  Surgeon:  Shelly Rubenstein, MD;  Location: [facility] OR;  Service: General;  Laterality: N/A;  . LAPAROSCOPIC INCISIONAL / UMBILICAL / VENTRAL HERNIA REPAIR  01/24/2013   IHR w/mesh/notes  . NASAL SEPTUM SURGERY  1980's?  . OOPHORECTOMY Right 2009  . TONSILLECTOMY  1990's  . VAGINAL HYSTERECTOMY  ` 06/2007   Past Medical History:  Diagnosis Date  . Allergy   . Anxiety   . Arthritis    "just the norm" (01/24/2013)  . ASCVD (arteriosclerotic cardiovascular disease)   . Asthma   . Celiac disease   . Daily headache    "depends on the season" (01/24/2013)  . Fibromyalgia   . GERD (gastroesophageal reflux disease)   . History of colonic polyps   . Hyperlipidemia   . Hypertension   . Migraines   . Obesity (BMI 30.0-34.9)   . RSD (reflex sympathetic dystrophy)   . Sleep apnea    NO MACHINE RECOMMENDED  . TIA (transient ischemic attack) ~ 2009   BP 123/79   Pulse 75   Ht 5\' 7"  (1.702 m)   Wt 176 lb 12.8 oz (80.2 kg)   SpO2 97%   BMI 27.69 kg/m   Opioid Risk Score:   Fall Risk Score:  `1  Depression screen PHQ 2/9  Depression screen [facility] 2/9 05/09/2018 02/06/2018 01/09/2018 10/04/2017 09/05/2017 08/05/2017 04/04/2017  Decreased Interest 1 1 1 1  0 0 3  Down, Depressed, Hopeless 1 1 1 1  0 0 2  PHQ - 2 Score 2 2 2 2  0 0 5  Altered sleeping - 0 - - - - -  Tired, decreased energy - 1 - - - - -  Change in appetite - 0 - - - - -  Feeling bad or failure about yourself  - 0 - - - - -  Trouble concentrating - 0 - - - - -  Moving slowly or fidgety/restless - 0 - - - - -  Suicidal thoughts - 0 - - - - -  PHQ-9 Score - 3 - - - - -  Difficult doing work/chores - Not difficult at all - - - - -  Some recent data might be hidden     Review of Systems  Constitutional: Negative.   HENT: Negative.   Eyes: Negative.   Respiratory: Negative.   Cardiovascular: Negative.   Gastrointestinal: Negative.   Endocrine: Negative.   Genitourinary: Positive for difficulty urinating.  Musculoskeletal: Positive for  arthralgias, gait problem and myalgias.  Skin: Negative.   Allergic/Immunologic: Negative.   Neurological: Positive for dizziness, tremors, weakness and numbness.  Hematological: Negative.  Psychiatric/Behavioral: Positive for confusion and dysphoric mood. The patient is nervous/anxious.   All other systems reviewed and are negative.      Objective:   Physical Exam  Constitutional: She is oriented to person, place, and time. She appears well-developed and well-nourished.  HENT:  Head: Normocephalic and atraumatic.  Neck: Normal range of motion. Neck supple.  Cardiovascular: Normal rate and regular rhythm.  Pulmonary/Chest: Effort normal and breath sounds normal.  Musculoskeletal:  Normal Muscle Bulk and Muscle Testing Reveals: Upper Extremities: Right: Full  ROM and Muscle Strength 5/5 Right AC Joint Tenderness Left: Decreased ROM 90 Degrees and Muscle Strength 4/5 Lumbar Paraspinal Tenderness: L-3-L-5 Lower Extremities: Full ROM and Muscle Strength 5/5 Arises from Table slowly Antalgic Gait    Neurological: She is alert and oriented to person, place, and time.  Skin: Skin is warm and dry.  Psychiatric: She has a normal mood and affect. Her behavior is normal.  Nursing note and vitals reviewed.         Assessment & Plan:  1.Complex Regional Pain Syndrome Type 1: Continue Topamax and Cymbalta. 06/09/2018 Refilled:Hydro-codone 7.5/325mg  one tablet twice a day one in the morning and one at bedtime#60. Continue:Tramadol 100 mg ER daily #30 and Tramadol 50 mg one tablet by mouth BID. One tablet in the afternoon and one tablet in the evening. We will continue the opioid monitoring program, this consists of regular clinic visits, examinations, urine drug screen, pill counts as well as use of [place] Controlled Substance Reporting System. 2. Depression: Continuecurrent medication regimen withCymbalta. 06/09/2018 3. Insomnia: Continuecurrent medication regimen  withTrazodone50 mg HS. 06/09/2018 4. Muscle Spasms: Continuecurrent medication regimen withTizanidine. 06/09/2018 5. Chronic Pain Syndrome: Continue Ibuprofen. 06/09/2018 6. Right Shoulder Tendonitis/ Fall: Refuse X-ray at this time. Will call office if she changes her mind. Educated on [employer]. 06/09/2018.  20 minutes of face to face patient care time was spent during this visit. All questions were encouraged and answered.  F/U in 1 month

## 2018-06-19 ENCOUNTER — Other Ambulatory Visit: Payer: Self-pay | Admitting: Registered Nurse

## 2018-07-08 DIAGNOSIS — K2270 Barrett's esophagus without dysplasia: Secondary | ICD-10-CM | POA: Diagnosis not present

## 2018-07-08 DIAGNOSIS — R1084 Generalized abdominal pain: Secondary | ICD-10-CM | POA: Diagnosis not present

## 2018-07-10 ENCOUNTER — Encounter: Payer: Self-pay | Admitting: Physical Medicine & Rehabilitation

## 2018-07-10 ENCOUNTER — Encounter: Payer: Worker's Compensation | Attending: Physical Medicine & Rehabilitation

## 2018-07-10 ENCOUNTER — Ambulatory Visit (HOSPITAL_BASED_OUTPATIENT_CLINIC_OR_DEPARTMENT_OTHER): Payer: Worker's Compensation | Admitting: Physical Medicine & Rehabilitation

## 2018-07-10 VITALS — BP 120/78 | HR 70 | Ht 67.0 in | Wt 175.6 lb

## 2018-07-10 DIAGNOSIS — Z90721 Acquired absence of ovaries, unilateral: Secondary | ICD-10-CM | POA: Insufficient documentation

## 2018-07-10 DIAGNOSIS — Z801 Family history of malignant neoplasm of trachea, bronchus and lung: Secondary | ICD-10-CM | POA: Insufficient documentation

## 2018-07-10 DIAGNOSIS — F419 Anxiety disorder, unspecified: Secondary | ICD-10-CM | POA: Insufficient documentation

## 2018-07-10 DIAGNOSIS — E785 Hyperlipidemia, unspecified: Secondary | ICD-10-CM | POA: Insufficient documentation

## 2018-07-10 DIAGNOSIS — Z79891 Long term (current) use of opiate analgesic: Secondary | ICD-10-CM | POA: Insufficient documentation

## 2018-07-10 DIAGNOSIS — J45909 Unspecified asthma, uncomplicated: Secondary | ICD-10-CM | POA: Insufficient documentation

## 2018-07-10 DIAGNOSIS — Z86010 Personal history of colonic polyps: Secondary | ICD-10-CM | POA: Insufficient documentation

## 2018-07-10 DIAGNOSIS — Z5181 Encounter for therapeutic drug level monitoring: Secondary | ICD-10-CM | POA: Diagnosis not present

## 2018-07-10 DIAGNOSIS — M1990 Unspecified osteoarthritis, unspecified site: Secondary | ICD-10-CM | POA: Insufficient documentation

## 2018-07-10 DIAGNOSIS — K900 Celiac disease: Secondary | ICD-10-CM | POA: Diagnosis not present

## 2018-07-10 DIAGNOSIS — Z79899 Other long term (current) drug therapy: Secondary | ICD-10-CM | POA: Insufficient documentation

## 2018-07-10 DIAGNOSIS — G4730 Sleep apnea, unspecified: Secondary | ICD-10-CM | POA: Diagnosis not present

## 2018-07-10 DIAGNOSIS — G90512 Complex regional pain syndrome I of left upper limb: Secondary | ICD-10-CM | POA: Insufficient documentation

## 2018-07-10 DIAGNOSIS — I10 Essential (primary) hypertension: Secondary | ICD-10-CM | POA: Diagnosis not present

## 2018-07-10 DIAGNOSIS — Z800 Family history of malignant neoplasm of digestive organs: Secondary | ICD-10-CM | POA: Diagnosis not present

## 2018-07-10 DIAGNOSIS — K219 Gastro-esophageal reflux disease without esophagitis: Secondary | ICD-10-CM | POA: Diagnosis not present

## 2018-07-10 DIAGNOSIS — Z8249 Family history of ischemic heart disease and other diseases of the circulatory system: Secondary | ICD-10-CM | POA: Insufficient documentation

## 2018-07-10 DIAGNOSIS — M797 Fibromyalgia: Secondary | ICD-10-CM | POA: Diagnosis not present

## 2018-07-10 DIAGNOSIS — Z8673 Personal history of transient ischemic attack (TIA), and cerebral infarction without residual deficits: Secondary | ICD-10-CM | POA: Insufficient documentation

## 2018-07-10 DIAGNOSIS — Z90710 Acquired absence of both cervix and uterus: Secondary | ICD-10-CM | POA: Diagnosis not present

## 2018-07-10 MED ORDER — HYDROCODONE-ACETAMINOPHEN 7.5-325 MG PO TABS: ORAL_TABLET | ORAL | 0 refills | Status: DC

## 2018-07-10 MED ORDER — TIZANIDINE HCL 4 MG PO TABS: ORAL_TABLET | ORAL | 3 refills | Status: DC

## 2018-07-10 NOTE — Progress Notes (Signed)
Subjective:    Patient ID: [name], female    DOB: 1966-08-06, 52 y.o.   MRN: [id number]  HPI EGD with dilatation on Wednesday, per GI will likely need increased pain medication post procedure.  Planning to write for ~20 tramadol We discussed that it is ok to do so 1 fall 2 months ago, No assist needed for ADLs  Has had no new issues with her RSD however continues to not use the left upper extremity.  This is due to pain hypersensitivity.  She also complains of some hypersensitivity in both feet left greater than right. Pain Inventory Average Pain 7 Pain Right Now 7 My pain is constant, sharp, burning, dull, stabbing, tingling and aching  In the last 24 hours, has pain interfered with the following? General activity 6 Relation with others 6 Enjoyment of life 6 What TIME of day is your pain at its worst? all Sleep (in general) Poor  Pain is worse with: walking, bending, sitting, inactivity and standing Pain improves with: rest, heat/ice, therapy/exercise, pacing activities, medication, TENS and injections Relief from Meds: 7  Mobility ability to climb steps?  yes do you drive?  yes  Function disabled: date disabled 55 I need assistance with the following:  meal prep, household duties and shopping  Neuro/Psych bladder control problems weakness numbness tremor tingling trouble walking spasms dizziness anxiety  Prior Studies Any changes since last visit?  no  Physicians involved in your care Any changes since last visit?  no   Family History  Problem Relation Age of Onset  . Cancer Mother        lung  . Hypertension Mother   . Cancer Father        colon  . Hypertension Sister   . Cancer Sister   . Cancer Brother   . Hyperlipidemia Sister   . Birth defects Maternal Grandmother        breast  . Breast cancer Maternal Grandmother   . Birth defects Paternal Grandmother        uterine, stomach, lung  . Breast cancer Paternal Grandmother     Social History   Socioeconomic History  . Marital status: Married    Spouse name: Not on file  . Number of children: 2  . Years of education: Not on file  . Highest education level: Not on file  Occupational History  . 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 Financial resource strain: Not on file  . Food insecurity:    Worry: Not on file    Inability: Not on file  . Transportation needs:    Medical: Not on file    Non-medical: Not on file  Tobacco Use  . Smoking status: Never Smoker  . Smokeless tobacco: Never Used  Substance and Sexual Activity  . Alcohol use: Yes    Alcohol/week: 0.0 standard drinks    Comment: 01/24/2013 "drink or 2 once or /twice/year, if that"  . Drug use: No  . Sexual activity: Not Currently  Lifestyle  . Physical activity:    Days per week: Not on file    Minutes per session: Not on file  . Stress: Not on file  Relationships  . Social connections:    Talks on phone: Not on file    Gets together: Not on file    Attends religious service: Not on file    Active member of club or organization: Not on file    Attends meetings of clubs or organizations:  Not on file    Relationship status: Not on file  Other Topics Concern  . Not on file  Social History Narrative   No living will   Would want husband as POA--then sister, [name]   Would accept resuscitation attempts   Probably would not want tube feeds if cognitively unaware   Past Surgical History:  Procedure Laterality Date  . APPENDECTOMY  ~ 06/2007  . BLADDER REPAIR  ~ 06/2007   "same day after bladder lift" (01/24/2013)  . BLADDER SUSPENSION  ~ 06/2007  . BREAST BIOPSY Left   . DIAGNOSTIC LAPAROSCOPY  1990's & ~ 2000   "I've had a couple; for endometrosis" (01/24/2013)  . HERNIA REPAIR    . INCISIONAL HERNIA REPAIR N/A 01/24/2013   Procedure: LAPAROSCOPIC INCISIONAL HERNIA;  Surgeon: Harl Bowie, MD;  Location: [place];  Service: General;  Laterality: N/A;  . INSERTION OF MESH  N/A 01/24/2013   Procedure: INSERTION OF MESH;  Surgeon: Harl Bowie, MD;  Location: [place];  Service: General;  Laterality: N/A;  . LAPAROSCOPIC INCISIONAL / UMBILICAL / [place]  01/24/2013   IHR w/mesh/notes  . NASAL SEPTUM SURGERY  1980's?  . OOPHORECTOMY Right 2009  . TONSILLECTOMY  1990's  . VAGINAL HYSTERECTOMY  ` 06/2007   Past Medical History:  Diagnosis Date  . Allergy   . Anxiety   . Arthritis    "just the norm" (01/24/2013)  . ASCVD (arteriosclerotic cardiovascular disease)   . Asthma   . Celiac disease   . Daily headache    "depends on the season" (01/24/2013)  . Fibromyalgia   . GERD (gastroesophageal reflux disease)   . History of colonic polyps   . Hyperlipidemia   . Hypertension   . Migraines   . Obesity (BMI 30.0-34.9)   . RSD (reflex sympathetic dystrophy)   . Sleep apnea    NO MACHINE RECOMMENDED  . TIA (transient ischemic attack) ~ 2009   BP 120/78   Pulse 70   Ht 5' 7"  (1.702 m)   Wt 175 lb 9.6 oz (79.7 kg)   SpO2 95%   BMI 27.50 kg/m   Opioid Risk Score:   Fall Risk Score:  `1  Depression screen PHQ 2/9  Depression screen [facility] 2/9 05/09/2018 02/06/2018 01/09/2018 10/04/2017 09/05/2017 08/05/2017 04/04/2017  Decreased Interest 1 1 1 1  0 0 3  Down, Depressed, Hopeless 1 1 1 1  0 0 2  PHQ - 2 Score 2 2 2 2  0 0 5  Altered sleeping - 0 - - - - -  Tired, decreased energy - 1 - - - - -  Change in appetite - 0 - - - - -  Feeling bad or failure about yourself  - 0 - - - - -  Trouble concentrating - 0 - - - - -  Moving slowly or fidgety/restless - 0 - - - - -  Suicidal thoughts - 0 - - - - -  PHQ-9 Score - 3 - - - - -  Difficult doing work/chores - Not difficult at all - - - - -  Some recent data might be hidden     Review of Systems  Constitutional: Negative.   HENT: Negative.   Eyes: Negative.   Respiratory: Negative.   Cardiovascular: Negative.   Gastrointestinal: Negative.   Endocrine: Negative.   Genitourinary: Negative.    Musculoskeletal: Positive for arthralgias, back pain, gait problem and myalgias.  Skin: Negative.   Allergic/Immunologic: Negative.  Neurological: Positive for tremors, weakness and numbness.  Hematological: Negative.   Psychiatric/Behavioral: Positive for dysphoric mood. The patient is nervous/anxious.   All other systems reviewed and are negative.      Objective:   Physical Exam  Constitutional: She is oriented to person, place, and time.  Neurological: She is alert and oriented to person, place, and time.  Nursing note and vitals reviewed.  Hyperhidrosis left hand.  No skin color changes no nailbed changes no hand intrinsic atrophy no finger or wrist contracture.  She has 3- strength at the deltoid pain inhibition 3 at the bicep tricep finger flexors and extensor moves slowly.  No evidence of tremor. Lower extremity has 4/5 in the hip flexor knee knee extensor ankle dorsiflexor once again has hypersensitivity in both feet (right side.  Ambulates without assistive device Wears slippers       Assessment & Plan:  1.  RSD Left upper limb, this results from a left wrist sprain. Onset 1994 while working in [place]. Continue hydrocodone 7.5 milligrams twice a day Cymbalta 90 mg per day. Reduce Ibuprofen 600 mg 2 times a day Lidoderm patch up to 3 daily. Tizanidine 4 mg 4 times a day Topamax 100 mg every morning and 200 mg every afternoon Tramadol 50 g twice a day Tramadol ER 100 mg daily Trazodone 50 daily at bedtime  UDS today PMP Aware  Based on limited effectiveness of oral meds as well as failure of Stellate ganglion blocks performed in [place] , would rec referral to Dr Wynona Canes to evaluate for Spinal cord stimulation

## 2018-07-10 NOTE — Patient Instructions (Addendum)
[place]   OK for GI MD to prescribe post operative pain medication   Referral to Dr Wynona Canes to consult about spinal cord stimulation

## 2018-07-12 DIAGNOSIS — Z01818 Encounter for other preprocedural examination: Secondary | ICD-10-CM | POA: Diagnosis not present

## 2018-07-12 DIAGNOSIS — R1310 Dysphagia, unspecified: Secondary | ICD-10-CM | POA: Diagnosis not present

## 2018-07-13 DIAGNOSIS — K5281 Eosinophilic gastritis or gastroenteritis: Secondary | ICD-10-CM | POA: Diagnosis not present

## 2018-07-14 DIAGNOSIS — K2270 Barrett's esophagus without dysplasia: Secondary | ICD-10-CM | POA: Diagnosis not present

## 2018-07-14 LAB — TOXASSURE SELECT,+ANTIDEPR,UR

## 2018-07-20 ENCOUNTER — Telehealth: Payer: Self-pay | Admitting: *Deleted

## 2018-07-20 NOTE — Telephone Encounter (Signed)
Urine drug screen for this encounter is consistent for prescribed medication 

## 2018-08-07 ENCOUNTER — Telehealth: Payer: Self-pay | Admitting: *Deleted

## 2018-08-07 ENCOUNTER — Encounter: Payer: Self-pay | Admitting: Registered Nurse

## 2018-08-07 ENCOUNTER — Encounter: Payer: Worker's Compensation | Attending: Registered Nurse | Admitting: Registered Nurse

## 2018-08-07 VITALS — BP 130/79 | HR 68 | Ht 67.0 in | Wt 177.4 lb

## 2018-08-07 DIAGNOSIS — E669 Obesity, unspecified: Secondary | ICD-10-CM | POA: Insufficient documentation

## 2018-08-07 DIAGNOSIS — Z9889 Other specified postprocedural states: Secondary | ICD-10-CM | POA: Insufficient documentation

## 2018-08-07 DIAGNOSIS — M62838 Other muscle spasm: Secondary | ICD-10-CM | POA: Diagnosis not present

## 2018-08-07 DIAGNOSIS — Z8673 Personal history of transient ischemic attack (TIA), and cerebral infarction without residual deficits: Secondary | ICD-10-CM | POA: Insufficient documentation

## 2018-08-07 DIAGNOSIS — G4700 Insomnia, unspecified: Secondary | ICD-10-CM | POA: Insufficient documentation

## 2018-08-07 DIAGNOSIS — Z79899 Other long term (current) drug therapy: Secondary | ICD-10-CM | POA: Insufficient documentation

## 2018-08-07 DIAGNOSIS — Z90710 Acquired absence of both cervix and uterus: Secondary | ICD-10-CM | POA: Insufficient documentation

## 2018-08-07 DIAGNOSIS — Z6830 Body mass index (BMI) 30.0-30.9, adult: Secondary | ICD-10-CM | POA: Diagnosis not present

## 2018-08-07 DIAGNOSIS — Z5181 Encounter for therapeutic drug level monitoring: Secondary | ICD-10-CM | POA: Diagnosis not present

## 2018-08-07 DIAGNOSIS — G90512 Complex regional pain syndrome I of left upper limb: Secondary | ICD-10-CM

## 2018-08-07 DIAGNOSIS — M79602 Pain in left arm: Secondary | ICD-10-CM | POA: Diagnosis not present

## 2018-08-07 DIAGNOSIS — F3289 Other specified depressive episodes: Secondary | ICD-10-CM

## 2018-08-07 DIAGNOSIS — I10 Essential (primary) hypertension: Secondary | ICD-10-CM | POA: Diagnosis not present

## 2018-08-07 DIAGNOSIS — Z760 Encounter for issue of repeat prescription: Secondary | ICD-10-CM | POA: Insufficient documentation

## 2018-08-07 DIAGNOSIS — F329 Major depressive disorder, single episode, unspecified: Secondary | ICD-10-CM | POA: Insufficient documentation

## 2018-08-07 DIAGNOSIS — G9050 Complex regional pain syndrome I, unspecified: Secondary | ICD-10-CM | POA: Insufficient documentation

## 2018-08-07 DIAGNOSIS — G894 Chronic pain syndrome: Secondary | ICD-10-CM

## 2018-08-07 MED ORDER — TRAMADOL HCL ER 100 MG PO TB24: 100.0000 mg | ORAL_TABLET | Freq: Every day | ORAL | 2 refills | Status: DC

## 2018-08-07 MED ORDER — IBUPROFEN 600 MG PO TABS: 600.0000 mg | ORAL_TABLET | Freq: Two times a day (BID) | ORAL | 8 refills | Status: DC | PRN

## 2018-08-07 MED ORDER — TRAMADOL HCL 50 MG PO TABS: ORAL_TABLET | ORAL | 2 refills | Status: DC

## 2018-08-07 MED ORDER — HYDROCODONE-ACETAMINOPHEN 7.5-325 MG PO TABS: ORAL_TABLET | ORAL | 0 refills | Status: DC

## 2018-08-07 NOTE — Telephone Encounter (Signed)
Letter printed and copy of Kirsteins 07/10/18 note, and note from Danella Sensing NP visit today will be printed and faxed to attorney [facility] [phone])

## 2018-08-07 NOTE — Progress Notes (Signed)
Subjective:    Patient ID: [name], female    DOB: 1966/08/23, 52 y.o.   MRN: [id number]  HPI: [name] is a 52 year old female who returns for follow up appointment for chronic pain and medication refill. She states her pain is located in her left arm. She rates her pain 8. Her current exercise regime is walking.   [name] Morphine Equivalent is 35. . Last UDS was Performed on 07/10/2018, it was consistent.   Pain Inventory Average Pain 8 Pain Right Now 8 My pain is constant, sharp, burning, stabbing, tingling and aching  In the last 24 hours, has pain interfered with the following? General activity 2 Relation with others 2 Enjoyment of life 2 What TIME of day is your pain at its worst? evening Sleep (in general) Poor  Pain is worse with: walking, bending, sitting, standing, unsure and some activites Pain improves with: rest, heat/ice, therapy/exercise, pacing activities, medication, TENS and injections Relief from Meds: 7  Mobility walk without assistance ability to climb steps?  yes do you drive?  yes  Function I need assistance with the following:  meal prep, household duties and shopping  Neuro/Psych bladder control problems weakness numbness tremor tingling trouble walking spasms dizziness depression anxiety  Prior Studies Any changes since last visit?  no  Physicians involved in your care Any changes since last visit?  no   Family History  Problem Relation Age of Onset  . Cancer Mother        lung  . Hypertension Mother   . Cancer Father        colon  . Hypertension Sister   . Cancer Sister   . Cancer Brother   . Hyperlipidemia Sister   . Birth defects Maternal Grandmother        breast  . Breast cancer Maternal Grandmother   . Birth defects Paternal Grandmother        uterine, stomach, lung  . Breast cancer Paternal Grandmother    Social History   Socioeconomic History  . Marital  status: Married    Spouse name: Not on file  . Number of children: 2  . Years of education: Not on file  . Highest education level: Not on file  Occupational History  . 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 Financial resource strain: Not on file  . Food insecurity:    Worry: Not on file    Inability: Not on file  . Transportation needs:    Medical: Not on file    Non-medical: Not on file  Tobacco Use  . Smoking status: Never Smoker  . Smokeless tobacco: Never Used  Substance and Sexual Activity  . Alcohol use: Yes    Alcohol/week: 0.0 standard drinks    Comment: 01/24/2013 "drink or 2 once or /twice/year, if that"  . Drug use: No  . Sexual activity: Not Currently  Lifestyle  . Physical activity:    Days per week: Not on file    Minutes per session: Not on file  . Stress: Not on file  Relationships  . Social connections:    Talks on phone: Not on file    Gets together: Not on file    Attends religious service: Not on file    Active member of club or organization: Not on file    Attends meetings of clubs or organizations: Not on file    Relationship status: Not on file  Other Topics Concern  . Not  on file  Social History Narrative   No living will   Would want husband as POA--then sister, [name]   Would accept resuscitation attempts   Probably would not want tube feeds if cognitively unaware   Past Surgical History:  Procedure Laterality Date  . APPENDECTOMY  ~ 06/2007  . BLADDER REPAIR  ~ 06/2007   "same day after bladder lift" (01/24/2013)  . BLADDER SUSPENSION  ~ 06/2007  . BREAST BIOPSY Left   . DIAGNOSTIC LAPAROSCOPY  1990's & ~ 2000   "I've had a couple; for endometrosis" (01/24/2013)  . HERNIA REPAIR    . INCISIONAL HERNIA REPAIR N/A 01/24/2013   Procedure: LAPAROSCOPIC INCISIONAL HERNIA;  Surgeon: Shelly Rubenstein, MD;  Location: [facility] OR;  Service: General;  Laterality: N/A;  . INSERTION OF MESH N/A 01/24/2013   Procedure: INSERTION OF MESH;  Surgeon:  Shelly Rubenstein, MD;  Location: [facility] OR;  Service: General;  Laterality: N/A;  . LAPAROSCOPIC INCISIONAL / UMBILICAL / VENTRAL HERNIA REPAIR  01/24/2013   IHR w/mesh/notes  . NASAL SEPTUM SURGERY  1980's?  . OOPHORECTOMY Right 2009  . TONSILLECTOMY  1990's  . VAGINAL HYSTERECTOMY  ` 06/2007   Past Medical History:  Diagnosis Date  . Allergy   . Anxiety   . Arthritis    "just the norm" (01/24/2013)  . ASCVD (arteriosclerotic cardiovascular disease)   . Asthma   . Celiac disease   . Daily headache    "depends on the season" (01/24/2013)  . Fibromyalgia   . GERD (gastroesophageal reflux disease)   . History of colonic polyps   . Hyperlipidemia   . Hypertension   . Migraines   . Obesity (BMI 30.0-34.9)   . RSD (reflex sympathetic dystrophy)   . Sleep apnea    NO MACHINE RECOMMENDED  . TIA (transient ischemic attack) ~ 2009   There were no vitals taken for this visit.  Opioid Risk Score:   Fall Risk Score:  `1  Depression screen PHQ 2/9  Depression screen [facility] 2/9 05/09/2018 02/06/2018 01/09/2018 10/04/2017 09/05/2017 08/05/2017 04/04/2017  Decreased Interest 1 1 1 1  0 0 3  Down, Depressed, Hopeless 1 1 1 1  0 0 2  PHQ - 2 Score 2 2 2 2  0 0 5  Altered sleeping - 0 - - - - -  Tired, decreased energy - 1 - - - - -  Change in appetite - 0 - - - - -  Feeling bad or failure about yourself  - 0 - - - - -  Trouble concentrating - 0 - - - - -  Moving slowly or fidgety/restless - 0 - - - - -  Suicidal thoughts - 0 - - - - -  PHQ-9 Score - 3 - - - - -  Difficult doing work/chores - Not difficult at all - - - - -  Some recent data might be hidden     Review of Systems  Constitutional: Negative.   HENT: Negative.   Eyes: Negative.   Respiratory: Negative.   Cardiovascular: Negative.   Gastrointestinal: Negative.   Endocrine: Negative.   Genitourinary: Negative.   Musculoskeletal: Positive for arthralgias, gait problem and myalgias.  Skin: Negative.   Allergic/Immunologic:  Negative.   Neurological: Positive for dizziness, tremors, weakness and numbness.  Hematological: Negative.   Psychiatric/Behavioral: Positive for dysphoric mood. The patient is nervous/anxious.   All other systems reviewed and are negative.      Objective:   Physical Exam  Constitutional:  She is oriented to person, place, and time. She appears well-developed and well-nourished.  HENT:  Head: Normocephalic and atraumatic.  Neck: Normal range of motion. Neck supple.  Cervical Paraspinal Tenderness: C-5-C-6  Cardiovascular: Normal rate and regular rhythm.  Pulmonary/Chest: Effort normal and breath sounds normal.  Musculoskeletal:  Normal Muscle Bulk and Muscle Testing Reveals:  Upper Extremities: Right: Full ROM and Muscle Strength 4/5 Left: Full ROM and Muscle Strength 5/5 3/5 Bilateral AC Joint Tenderness Thoracic Paraspinal Tenderness: T-1-T-3 Lower Extremities: Decreased ROM and Muscle Strength 4/5 Bilateral Lower Extremities Flexion Produces Pain into Bilateral Lower Extremities and Bilateral Feet Arises from Table Slowly Antalgic gait  Neurological: She is alert and oriented to person, place, and time.  Skin: Skin is warm and dry.  Psychiatric: She has a normal mood and affect. Her behavior is normal.  Nursing note and vitals reviewed.         Assessment & Plan:  1.Complex Regional Pain Syndrome Type 1: Continue Topamax and Cymbalta. 08/07/2018 Refilled:Hydro-codone 7.5/325mg  one tablet twice a day one in the morning and one at bedtime#60. Continue:Tramadol 100 mg ER daily #30 and Tramadol 50 mg one tablet by mouth BID. One tablet in the afternoon and one tablet in the evening. We will continue the opioid monitoring program, this consists of regular clinic visits, examinations, urine drug screen, pill counts as well as use of [place] Controlled Substance Reporting System. 2. Depression: Continuecurrent medication regimen withCymbalta. 08/07/2018 3. Insomnia:  Continuecurrent medication regimen withTrazodone50 mg HS. 08/07/2018 4. Muscle Spasms: Continuecurrent medication regimen withTizanidine. 08/07/2018 5. Chronic Pain Syndrome: Continue Ibuprofen BID as needed. . 08/07/2018 .  20 minutes of face to face patient care time was spent during this visit. All questions were encouraged and answered.  F/U in 1 month

## 2018-08-11 ENCOUNTER — Other Ambulatory Visit: Payer: Self-pay | Admitting: Registered Nurse

## 2018-08-31 DIAGNOSIS — S0501XA Injury of conjunctiva and corneal abrasion without foreign body, right eye, initial encounter: Secondary | ICD-10-CM | POA: Diagnosis not present

## 2018-08-31 DIAGNOSIS — H109 Unspecified conjunctivitis: Secondary | ICD-10-CM | POA: Diagnosis not present

## 2018-09-07 ENCOUNTER — Encounter: Payer: Worker's Compensation | Attending: Registered Nurse | Admitting: Registered Nurse

## 2018-09-07 ENCOUNTER — Other Ambulatory Visit: Payer: Self-pay

## 2018-09-07 ENCOUNTER — Encounter: Payer: Self-pay | Admitting: Registered Nurse

## 2018-09-07 VITALS — BP 119/79 | HR 74 | Ht 67.0 in | Wt 173.6 lb

## 2018-09-07 DIAGNOSIS — Z8673 Personal history of transient ischemic attack (TIA), and cerebral infarction without residual deficits: Secondary | ICD-10-CM | POA: Diagnosis not present

## 2018-09-07 DIAGNOSIS — I10 Essential (primary) hypertension: Secondary | ICD-10-CM | POA: Insufficient documentation

## 2018-09-07 DIAGNOSIS — M797 Fibromyalgia: Secondary | ICD-10-CM | POA: Diagnosis not present

## 2018-09-07 DIAGNOSIS — G4700 Insomnia, unspecified: Secondary | ICD-10-CM | POA: Diagnosis not present

## 2018-09-07 DIAGNOSIS — E785 Hyperlipidemia, unspecified: Secondary | ICD-10-CM | POA: Insufficient documentation

## 2018-09-07 DIAGNOSIS — Z8249 Family history of ischemic heart disease and other diseases of the circulatory system: Secondary | ICD-10-CM | POA: Insufficient documentation

## 2018-09-07 DIAGNOSIS — G9050 Complex regional pain syndrome I, unspecified: Secondary | ICD-10-CM | POA: Insufficient documentation

## 2018-09-07 DIAGNOSIS — Z801 Family history of malignant neoplasm of trachea, bronchus and lung: Secondary | ICD-10-CM | POA: Insufficient documentation

## 2018-09-07 DIAGNOSIS — M62838 Other muscle spasm: Secondary | ICD-10-CM | POA: Insufficient documentation

## 2018-09-07 DIAGNOSIS — F3289 Other specified depressive episodes: Secondary | ICD-10-CM

## 2018-09-07 DIAGNOSIS — Z803 Family history of malignant neoplasm of breast: Secondary | ICD-10-CM | POA: Diagnosis not present

## 2018-09-07 DIAGNOSIS — M79602 Pain in left arm: Secondary | ICD-10-CM | POA: Diagnosis not present

## 2018-09-07 DIAGNOSIS — Z90710 Acquired absence of both cervix and uterus: Secondary | ICD-10-CM | POA: Insufficient documentation

## 2018-09-07 DIAGNOSIS — E669 Obesity, unspecified: Secondary | ICD-10-CM | POA: Diagnosis not present

## 2018-09-07 DIAGNOSIS — G894 Chronic pain syndrome: Secondary | ICD-10-CM | POA: Insufficient documentation

## 2018-09-07 DIAGNOSIS — Z5181 Encounter for therapeutic drug level monitoring: Secondary | ICD-10-CM

## 2018-09-07 DIAGNOSIS — Z9889 Other specified postprocedural states: Secondary | ICD-10-CM | POA: Insufficient documentation

## 2018-09-07 DIAGNOSIS — F419 Anxiety disorder, unspecified: Secondary | ICD-10-CM | POA: Insufficient documentation

## 2018-09-07 DIAGNOSIS — Z79899 Other long term (current) drug therapy: Secondary | ICD-10-CM

## 2018-09-07 DIAGNOSIS — G90512 Complex regional pain syndrome I of left upper limb: Secondary | ICD-10-CM | POA: Diagnosis not present

## 2018-09-07 DIAGNOSIS — Z760 Encounter for issue of repeat prescription: Secondary | ICD-10-CM | POA: Diagnosis present

## 2018-09-07 DIAGNOSIS — F329 Major depressive disorder, single episode, unspecified: Secondary | ICD-10-CM | POA: Diagnosis not present

## 2018-09-07 MED ORDER — HYDROCODONE-ACETAMINOPHEN 7.5-325 MG PO TABS: ORAL_TABLET | ORAL | 0 refills | Status: DC

## 2018-09-07 NOTE — Progress Notes (Signed)
Subjective:    Patient ID: [name], female    DOB: [birth year], 52 y.o.   MRN: [id number]  HPI: [name] is a 52 year old female who returns for follow up appointment for chronic pain and medication refill. She states her pain is located in her left arm. She rates her pain 7. Her current exercise regime is walking short distances.   [name] Morphine Equivalent is 35.00 MME. Last UDS was Performed on 07/10/2018, it was consistent.    Pain Inventory Average Pain 8 Pain Right Now 7 My pain is constant, sharp, burning, stabbing, tingling and aching  In the last 24 hours, has pain interfered with the following? General activity 6 Relation with others 6 Enjoyment of life 6 What TIME of day is your pain at its worst? all Sleep (in general) Poor  Pain is worse with: walking, bending, inactivity and standing Pain improves with: rest, heat/ice, pacing activities, medication, TENS and injections Relief from Meds: 7  Mobility ability to climb steps?  yes do you drive?  yes  Function disabled: date disabled 94 I need assistance with the following:  meal prep, household duties and shopping  Neuro/Psych bladder control problems weakness numbness tremor tingling trouble walking spasms dizziness depression anxiety  Prior Studies Any changes since last visit?  no  Physicians involved in your care Any changes since last visit?  no   Family History  Problem Relation Age of Onset  . Cancer Mother        lung  . Hypertension Mother   . Cancer Father        colon  . Hypertension Sister   . Cancer Sister   . Cancer Brother   . Hyperlipidemia Sister   . Birth defects Maternal Grandmother        breast  . Breast cancer Maternal Grandmother   . Birth defects Paternal Grandmother        uterine, stomach, lung  . Breast cancer Paternal Grandmother    Social History   Socioeconomic History  . Marital status: Married    Spouse name:  Not on file  . Number of children: 2  . Years of education: Not on file  . Highest education level: Not on file  Occupational History  . 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 Financial resource strain: Not on file  . Food insecurity:    Worry: Not on file    Inability: Not on file  . Transportation needs:    Medical: Not on file    Non-medical: Not on file  Tobacco Use  . Smoking status: Never Smoker  . Smokeless tobacco: Never Used  Substance and Sexual Activity  . Alcohol use: Yes    Alcohol/week: 0.0 standard drinks    Comment: 01/24/2013 "drink or 2 once or /twice/year, if that"  . Drug use: No  . Sexual activity: Not Currently  Lifestyle  . Physical activity:    Days per week: Not on file    Minutes per session: Not on file  . Stress: Not on file  Relationships  . Social connections:    Talks on phone: Not on file    Gets together: Not on file    Attends religious service: Not on file    Active member of club or organization: Not on file    Attends meetings of clubs or organizations: Not on file    Relationship status: Not on file  Other Topics Concern  .  Not on file  Social History Narrative   No living will   Would want husband as POA--then sister, Neoma Laming   Would accept resuscitation attempts   Probably would not want tube feeds if cognitively unaware   Past Surgical History:  Procedure Laterality Date  . APPENDECTOMY  ~ 06/2007  . BLADDER REPAIR  ~ 06/2007   "same day after bladder lift" (01/24/2013)  . BLADDER SUSPENSION  ~ 06/2007  . BREAST BIOPSY Left   . DIAGNOSTIC LAPAROSCOPY  1990's & ~ 2000   "I've had a couple; for endometrosis" (01/24/2013)  . HERNIA REPAIR    . INCISIONAL HERNIA REPAIR N/A 01/24/2013   Procedure: LAPAROSCOPIC INCISIONAL HERNIA;  Surgeon: Harl Bowie, MD;  Location: [place];  Service: General;  Laterality: N/A;  . INSERTION OF MESH N/A 01/24/2013   Procedure: INSERTION OF MESH;  Surgeon: Harl Bowie, MD;  Location:  [place];  Service: General;  Laterality: N/A;  . LAPAROSCOPIC INCISIONAL / UMBILICAL / [place]  01/24/2013   IHR w/mesh/notes  . NASAL SEPTUM SURGERY  1980's?  . OOPHORECTOMY Right 2009  . TONSILLECTOMY  1990's  . VAGINAL HYSTERECTOMY  ` 06/2007   Past Medical History:  Diagnosis Date  . Allergy   . Anxiety   . Arthritis    "just the norm" (01/24/2013)  . ASCVD (arteriosclerotic cardiovascular disease)   . Asthma   . Celiac disease   . Daily headache    "depends on the season" (01/24/2013)  . Fibromyalgia   . GERD (gastroesophageal reflux disease)   . History of colonic polyps   . Hyperlipidemia   . Hypertension   . Migraines   . Obesity (BMI 30.0-34.9)   . RSD (reflex sympathetic dystrophy)   . Sleep apnea    NO MACHINE RECOMMENDED  . TIA (transient ischemic attack) ~ 2009   BP 119/79   Pulse 74   Ht 5' 7"  (1.702 m)   Wt 173 lb 9.6 oz (78.7 kg)   SpO2 93%   BMI 27.19 kg/m   Opioid Risk Score:   Fall Risk Score:  `1  Depression screen PHQ 2/9  Depression screen [facility] 2/9 09/07/2018 05/09/2018 02/06/2018 01/09/2018 10/04/2017 09/05/2017 08/05/2017  Decreased Interest 1 1 1 1 1  0 0  Down, Depressed, Hopeless 1 1 1 1 1  0 0  PHQ - 2 Score 2 2 2 2 2  0 0  Altered sleeping - - 0 - - - -  Tired, decreased energy - - 1 - - - -  Change in appetite - - 0 - - - -  Feeling bad or failure about yourself  - - 0 - - - -  Trouble concentrating - - 0 - - - -  Moving slowly or fidgety/restless - - 0 - - - -  Suicidal thoughts - - 0 - - - -  PHQ-9 Score - - 3 - - - -  Difficult doing work/chores - - Not difficult at all - - - -  Some recent data might be hidden   Review of Systems  Constitutional: Negative.   HENT: Negative.   Eyes: Negative.   Respiratory: Negative.   Cardiovascular: Negative.   Gastrointestinal: Negative.   Endocrine: Negative.   Genitourinary: Negative.   Musculoskeletal: Negative.   Skin: Negative.   Allergic/Immunologic: Negative.     Neurological: Negative.   Hematological: Negative.   Psychiatric/Behavioral: Negative.   All other systems reviewed and are negative.  Objective:   Physical Exam  Constitutional: She is oriented to person, place, and time. She appears well-developed and well-nourished.  HENT:  Head: Normocephalic and atraumatic.  Neck: Normal range of motion. Neck supple.  Cardiovascular: Normal rate and regular rhythm.  Pulmonary/Chest: Effort normal and breath sounds normal.  Musculoskeletal:  Normal Muscle Bulk and Muscle Testing Reveals: Upper Extremities: Right:Full ROM and Muscle Strength 5/5 Left: Decreased ROM 90 Degrees and Muscle Strength 3/5 Lower Extremities: Decreased ROM and Muscle Strength 4/4 Bilateral Lower Extremities Flexion Produces Pain into lower Extremities and Bilateral Feet Arises from Table Slowly Antalgic Gait   Neurological: She is alert and oriented to person, place, and time.  Skin: Skin is warm and dry.  Psychiatric: She has a normal mood and affect. Her behavior is normal.  Nursing note and vitals reviewed.         Assessment & Plan:  1.Complex Regional Pain Syndrome Type 1: Continue Topamax and Cymbalta. 09/07/2018 Refilled:Hydro-codone 7.5/325mg one tablet twice a day one in the morning and one at bedtime#60. Continue:Tramadol 100 mg ER daily #30 and Tramadol 50 mg one tablet by mouth BID. One tablet in the afternoon and one tablet in the evening. We will continue the opioid monitoring program, this consists of regular clinic visits, examinations, urine drug screen, pill counts as well as use of [place] Controlled Substance Reporting System. 2. Depression: Continuecurrent medication regimen withCymbalta. 09/07/2018 3. Insomnia: Continuecurrent medication regimen withTrazodone50 mg HS. 09/07/2018 4. Muscle Spasms: Continuecurrent medication regimen withTizanidine. 09/07/2018 5. Chronic Pain Syndrome: Continue Ibuprofen BID as needed. .  09/07/2018 . 20 minutes of face to face patient care time was spent during this visit. All questions were encouraged and answered.  F/U in 1 month

## 2018-09-28 ENCOUNTER — Other Ambulatory Visit: Payer: Self-pay | Admitting: Registered Nurse

## 2018-10-03 ENCOUNTER — Encounter: Payer: Worker's Compensation | Attending: Registered Nurse | Admitting: Registered Nurse

## 2018-10-03 ENCOUNTER — Encounter: Payer: Self-pay | Admitting: Registered Nurse

## 2018-10-03 VITALS — BP 120/82 | HR 77 | Ht 67.0 in | Wt 172.0 lb

## 2018-10-03 DIAGNOSIS — M797 Fibromyalgia: Secondary | ICD-10-CM | POA: Insufficient documentation

## 2018-10-03 DIAGNOSIS — Z79891 Long term (current) use of opiate analgesic: Secondary | ICD-10-CM | POA: Insufficient documentation

## 2018-10-03 DIAGNOSIS — R200 Anesthesia of skin: Secondary | ICD-10-CM | POA: Insufficient documentation

## 2018-10-03 DIAGNOSIS — I10 Essential (primary) hypertension: Secondary | ICD-10-CM | POA: Insufficient documentation

## 2018-10-03 DIAGNOSIS — K900 Celiac disease: Secondary | ICD-10-CM | POA: Insufficient documentation

## 2018-10-03 DIAGNOSIS — K219 Gastro-esophageal reflux disease without esophagitis: Secondary | ICD-10-CM | POA: Insufficient documentation

## 2018-10-03 DIAGNOSIS — Z8673 Personal history of transient ischemic attack (TIA), and cerebral infarction without residual deficits: Secondary | ICD-10-CM | POA: Diagnosis not present

## 2018-10-03 DIAGNOSIS — E785 Hyperlipidemia, unspecified: Secondary | ICD-10-CM | POA: Insufficient documentation

## 2018-10-03 DIAGNOSIS — Z5181 Encounter for therapeutic drug level monitoring: Secondary | ICD-10-CM | POA: Diagnosis not present

## 2018-10-03 DIAGNOSIS — M1990 Unspecified osteoarthritis, unspecified site: Secondary | ICD-10-CM | POA: Insufficient documentation

## 2018-10-03 DIAGNOSIS — G4700 Insomnia, unspecified: Secondary | ICD-10-CM

## 2018-10-03 DIAGNOSIS — F419 Anxiety disorder, unspecified: Secondary | ICD-10-CM | POA: Diagnosis not present

## 2018-10-03 DIAGNOSIS — G894 Chronic pain syndrome: Secondary | ICD-10-CM | POA: Diagnosis not present

## 2018-10-03 DIAGNOSIS — R262 Difficulty in walking, not elsewhere classified: Secondary | ICD-10-CM | POA: Insufficient documentation

## 2018-10-03 DIAGNOSIS — R42 Dizziness and giddiness: Secondary | ICD-10-CM | POA: Insufficient documentation

## 2018-10-03 DIAGNOSIS — Z8249 Family history of ischemic heart disease and other diseases of the circulatory system: Secondary | ICD-10-CM | POA: Insufficient documentation

## 2018-10-03 DIAGNOSIS — G90512 Complex regional pain syndrome I of left upper limb: Secondary | ICD-10-CM | POA: Diagnosis present

## 2018-10-03 DIAGNOSIS — R202 Paresthesia of skin: Secondary | ICD-10-CM | POA: Insufficient documentation

## 2018-10-03 DIAGNOSIS — R251 Tremor, unspecified: Secondary | ICD-10-CM | POA: Diagnosis not present

## 2018-10-03 DIAGNOSIS — I2510 Atherosclerotic heart disease of native coronary artery without angina pectoris: Secondary | ICD-10-CM | POA: Diagnosis not present

## 2018-10-03 DIAGNOSIS — Z79899 Other long term (current) drug therapy: Secondary | ICD-10-CM | POA: Diagnosis not present

## 2018-10-03 DIAGNOSIS — F3289 Other specified depressive episodes: Secondary | ICD-10-CM

## 2018-10-03 DIAGNOSIS — Z760 Encounter for issue of repeat prescription: Secondary | ICD-10-CM | POA: Diagnosis not present

## 2018-10-03 DIAGNOSIS — F329 Major depressive disorder, single episode, unspecified: Secondary | ICD-10-CM | POA: Insufficient documentation

## 2018-10-03 DIAGNOSIS — M62838 Other muscle spasm: Secondary | ICD-10-CM

## 2018-10-03 MED ORDER — TOPIRAMATE 100 MG PO TABS: ORAL_TABLET | ORAL | 4 refills | Status: DC

## 2018-10-03 MED ORDER — TRAMADOL HCL ER 100 MG PO TB24: 100.0000 mg | ORAL_TABLET | Freq: Every day | ORAL | 2 refills | Status: DC

## 2018-10-03 MED ORDER — HYDROCODONE-ACETAMINOPHEN 7.5-325 MG PO TABS: ORAL_TABLET | ORAL | 0 refills | Status: DC

## 2018-10-03 MED ORDER — TRAMADOL HCL 50 MG PO TABS: ORAL_TABLET | ORAL | 2 refills | Status: DC

## 2018-10-03 NOTE — Progress Notes (Signed)
Subjective:    Patient ID: [name], female    DOB: 01/21/1966, 52 y.o.   MRN: [id number]  HPI: [name] is a 52 y.o. female who returns for follow up appointment for chronic pain and medication refill. She states her pain is located in her left wrist and left arm.. She rates her  pain 10. Her current exercise regime is walking.   [name] Morphine equivalent is 35.00 MME.  Last UDS was Performed on 07/10/2018, it was consistent.   Pain Inventory Average Pain 10 Pain Right Now 10 My pain is sharp, burning, stabbing, tingling and aching  In the last 24 hours, has pain interfered with the following? General activity 2 Relation with others 2 Enjoyment of life 2 What TIME of day is your pain at its worst? all Sleep (in general) Poor  Pain is worse with: walking, bending, sitting, standing and some activites Pain improves with: rest, heat/ice, therapy/exercise, pacing activities, medication, TENS and injections Relief from Meds: 6  Mobility walk without assistance ability to climb steps?  yes do you drive?  yes  Function disabled: date disabled . I need assistance with the following:  dressing, meal prep, household duties and shopping  Neuro/Psych bladder control problems weakness numbness tremor tingling trouble walking spasms dizziness depression anxiety  Prior Studies Any changes since last visit?  no  Physicians involved in your care Any changes since last visit?  no   Family History  Problem Relation Age of Onset  . Cancer Mother        lung  . Hypertension Mother   . Cancer Father        colon  . Hypertension Sister   . Cancer Sister   . Cancer Brother   . Hyperlipidemia Sister   . Birth defects Maternal Grandmother        breast  . Breast cancer Maternal Grandmother   . Birth defects Paternal Grandmother        uterine, stomach, lung  . Breast cancer Paternal Grandmother    Social History   Socioeconomic  History  . Marital status: Married    Spouse name: Not on file  . Number of children: 2  . Years of education: Not on file  . Highest education level: Not on file  Occupational History  . 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 Financial resource strain: Not on file  . Food insecurity:    Worry: Not on file    Inability: Not on file  . Transportation needs:    Medical: Not on file    Non-medical: Not on file  Tobacco Use  . Smoking status: Never Smoker  . Smokeless tobacco: Never Used  Substance and Sexual Activity  . Alcohol use: Yes    Alcohol/week: 0.0 standard drinks    Comment: 01/24/2013 "drink or 2 once or /twice/year, if that"  . Drug use: No  . Sexual activity: Not Currently  Lifestyle  . Physical activity:    Days per week: Not on file    Minutes per session: Not on file  . Stress: Not on file  Relationships  . Social connections:    Talks on phone: Not on file    Gets together: Not on file    Attends religious service: Not on file    Active member of club or organization: Not on file    Attends meetings of clubs or organizations: Not on file    Relationship status: Not on file  Other Topics Concern  . Not on file  Social History Narrative   No living will   Would want husband as POA--then sister, [name]   Would accept resuscitation attempts   Probably would not want tube feeds if cognitively unaware   Past Surgical History:  Procedure Laterality Date  . APPENDECTOMY  ~ 06/2007  . BLADDER REPAIR  ~ 06/2007   "same day after bladder lift" (01/24/2013)  . BLADDER SUSPENSION  ~ 06/2007  . BREAST BIOPSY Left   . DIAGNOSTIC LAPAROSCOPY  1990's & ~ 2000   "I've had a couple; for endometrosis" (01/24/2013)  . HERNIA REPAIR    . INCISIONAL HERNIA REPAIR N/A 01/24/2013   Procedure: LAPAROSCOPIC INCISIONAL HERNIA;  Surgeon: Shelly Rubenstein, MD;  Location: [facility] OR;  Service: General;  Laterality: N/A;  . INSERTION OF MESH N/A 01/24/2013   Procedure: INSERTION  OF MESH;  Surgeon: Shelly Rubenstein, MD;  Location: [facility] OR;  Service: General;  Laterality: N/A;  . LAPAROSCOPIC INCISIONAL / UMBILICAL / VENTRAL HERNIA REPAIR  01/24/2013   IHR w/mesh/notes  . NASAL SEPTUM SURGERY  1980's?  . OOPHORECTOMY Right 2009  . TONSILLECTOMY  1990's  . VAGINAL HYSTERECTOMY  ` 06/2007   Past Medical History:  Diagnosis Date  . Allergy   . Anxiety   . Arthritis    "just the norm" (01/24/2013)  . ASCVD (arteriosclerotic cardiovascular disease)   . Asthma   . Celiac disease   . Daily headache    "depends on the season" (01/24/2013)  . Fibromyalgia   . GERD (gastroesophageal reflux disease)   . History of colonic polyps   . Hyperlipidemia   . Hypertension   . Migraines   . Obesity (BMI 30.0-34.9)   . RSD (reflex sympathetic dystrophy)   . Sleep apnea    NO MACHINE RECOMMENDED  . TIA (transient ischemic attack) ~ 2009   BP 120/82   Pulse 77   Ht 5\' 7"  (1.702 m)   Wt 172 lb (78 kg)   SpO2 96%   BMI 26.94 kg/m   Opioid Risk Score:   Fall Risk Score:  `1  Depression screen PHQ 2/9  Depression screen [facility] 2/9 09/07/2018 05/09/2018 02/06/2018 01/09/2018 10/04/2017 09/05/2017 08/05/2017  Decreased Interest 1 1 1 1 1  0 0  Down, Depressed, Hopeless 1 1 1 1 1  0 0  PHQ - 2 Score 2 2 2 2 2  0 0  Altered sleeping - - 0 - - - -  Tired, decreased energy - - 1 - - - -  Change in appetite - - 0 - - - -  Feeling bad or failure about yourself  - - 0 - - - -  Trouble concentrating - - 0 - - - -  Moving slowly or fidgety/restless - - 0 - - - -  Suicidal thoughts - - 0 - - - -  PHQ-9 Score - - 3 - - - -  Difficult doing work/chores - - Not difficult at all - - - -  Some recent data might be hidden    Review of Systems  Constitutional: Negative.   HENT: Negative.   Eyes: Negative.   Respiratory: Negative.   Cardiovascular: Negative.   Gastrointestinal: Negative.   Endocrine: Negative.   Genitourinary: Negative.   Musculoskeletal: Positive for arthralgias and  myalgias.       Spasms   Skin: Negative.   Allergic/Immunologic: Negative.   Neurological: Positive for dizziness, tremors, weakness and numbness.  Tingling  Hematological: Negative.   Psychiatric/Behavioral: The patient is nervous/anxious.   All other systems reviewed and are negative.      Objective:   Physical Exam Vitals signs and nursing note reviewed.  Constitutional:      Appearance: Normal appearance.  Neck:     Musculoskeletal: Normal range of motion and neck supple.  Cardiovascular:     Rate and Rhythm: Normal rate and regular rhythm.     Pulses: Normal pulses.     Heart sounds: Normal heart sounds.  Pulmonary:     Effort: Pulmonary effort is normal.     Breath sounds: Normal breath sounds.  Musculoskeletal:     Comments: Normal Muscle Bulk and Muscle Testing Reveals:  Upper Extremities: Full ROM and Muscle Strength on Right 4/5 and Left 3/5 Bilateral AC Joint Tenderness  Thoracic Paraspinal Tenderness: T-1-T-3   Lower Extremities: Full ROM and Muscle Strength 4/5 Left Lower Extremity Flexion Produces Pain into her Left Foot Arises from Table Slowly Antalgic  Gait   Skin:    General: Skin is warm and dry.  Neurological:     Mental Status: She is alert and oriented to person, place, and time.  Psychiatric:        Mood and Affect: Mood normal.        Behavior: Behavior normal.           Assessment & Plan:  1.Complex Regional Pain Syndrome Type 1: Continue Topamax and Cymbalta.10/03/2018 Refilled:Hydro-codone 7.5/325mg  one tablet twice a day one in the morning and one at bedtime#60. Continue:Tramadol 100 mg ER daily #30 and Tramadol 50 mg one tablet by mouth BID. One tablet in the afternoon and one tablet in the evening. We will continue the opioid monitoring program, this consists of regular clinic visits, examinations, urine drug screen, pill counts as well as use of [place] Controlled Substance Reporting System. 2. Depression:  Continuecurrent medication regimen withCymbalta.10/03/2018 3. Insomnia: Continuecurrent medication regimen withTrazodone50 mg HS. 10/03/2018 4. Muscle Spasms: Continuecurrent medication regimen withTizanidine.10/03/2018 5. Chronic Pain Syndrome: Continue IbuprofenBID as needed..10/03/2018 . 20 minutes of face to face patient care time was spent during this visit. All questions were encouraged and answered.  F/U in 1 month

## 2018-10-05 ENCOUNTER — Ambulatory Visit: Payer: Self-pay | Admitting: Registered Nurse

## 2018-10-05 ENCOUNTER — Telehealth: Payer: Self-pay

## 2018-10-05 NOTE — Telephone Encounter (Signed)
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 Informed her that sybil will have it taken care of as soon as she returns.

## 2018-10-09 NOTE — Telephone Encounter (Signed)
Letter written and given to Dr Wynn BankerKirsteins to sign.

## 2018-10-13 DIAGNOSIS — J018 Other acute sinusitis: Secondary | ICD-10-CM | POA: Diagnosis not present

## 2018-10-16 NOTE — Telephone Encounter (Signed)
Letter faxed to Murlean HarkGilbert and [employer], Jomarie Longsattn Christina [phone]

## 2018-10-25 DIAGNOSIS — K2270 Barrett's esophagus without dysplasia: Secondary | ICD-10-CM | POA: Diagnosis not present

## 2018-10-25 DIAGNOSIS — R109 Unspecified abdominal pain: Secondary | ICD-10-CM | POA: Diagnosis not present

## 2018-10-25 DIAGNOSIS — K900 Celiac disease: Secondary | ICD-10-CM | POA: Diagnosis not present

## 2018-11-03 ENCOUNTER — Encounter: Payer: Self-pay | Admitting: Registered Nurse

## 2018-11-03 ENCOUNTER — Telehealth: Payer: Self-pay

## 2018-11-03 ENCOUNTER — Encounter: Payer: Worker's Compensation | Attending: Registered Nurse | Admitting: Registered Nurse

## 2018-11-03 VITALS — BP 131/83 | HR 62 | Ht 67.5 in | Wt 171.0 lb

## 2018-11-03 DIAGNOSIS — G4700 Insomnia, unspecified: Secondary | ICD-10-CM | POA: Diagnosis not present

## 2018-11-03 DIAGNOSIS — Z5181 Encounter for therapeutic drug level monitoring: Secondary | ICD-10-CM

## 2018-11-03 DIAGNOSIS — Z90710 Acquired absence of both cervix and uterus: Secondary | ICD-10-CM | POA: Insufficient documentation

## 2018-11-03 DIAGNOSIS — M79602 Pain in left arm: Secondary | ICD-10-CM | POA: Insufficient documentation

## 2018-11-03 DIAGNOSIS — Z6830 Body mass index (BMI) 30.0-30.9, adult: Secondary | ICD-10-CM | POA: Diagnosis not present

## 2018-11-03 DIAGNOSIS — G9050 Complex regional pain syndrome I, unspecified: Secondary | ICD-10-CM | POA: Insufficient documentation

## 2018-11-03 DIAGNOSIS — M62838 Other muscle spasm: Secondary | ICD-10-CM | POA: Diagnosis not present

## 2018-11-03 DIAGNOSIS — Z79899 Other long term (current) drug therapy: Secondary | ICD-10-CM | POA: Insufficient documentation

## 2018-11-03 DIAGNOSIS — G90512 Complex regional pain syndrome I of left upper limb: Secondary | ICD-10-CM | POA: Diagnosis not present

## 2018-11-03 DIAGNOSIS — Z760 Encounter for issue of repeat prescription: Secondary | ICD-10-CM | POA: Insufficient documentation

## 2018-11-03 DIAGNOSIS — Z9889 Other specified postprocedural states: Secondary | ICD-10-CM | POA: Diagnosis not present

## 2018-11-03 DIAGNOSIS — G894 Chronic pain syndrome: Secondary | ICD-10-CM

## 2018-11-03 DIAGNOSIS — E669 Obesity, unspecified: Secondary | ICD-10-CM | POA: Insufficient documentation

## 2018-11-03 DIAGNOSIS — F329 Major depressive disorder, single episode, unspecified: Secondary | ICD-10-CM | POA: Insufficient documentation

## 2018-11-03 DIAGNOSIS — Z8673 Personal history of transient ischemic attack (TIA), and cerebral infarction without residual deficits: Secondary | ICD-10-CM | POA: Insufficient documentation

## 2018-11-03 DIAGNOSIS — F3289 Other specified depressive episodes: Secondary | ICD-10-CM

## 2018-11-03 DIAGNOSIS — I10 Essential (primary) hypertension: Secondary | ICD-10-CM | POA: Insufficient documentation

## 2018-11-03 MED ORDER — TRAMADOL HCL ER 100 MG PO TB24: 100.0000 mg | ORAL_TABLET | Freq: Every day | ORAL | 2 refills | Status: DC

## 2018-11-03 MED ORDER — TRAMADOL HCL 50 MG PO TABS: ORAL_TABLET | ORAL | 2 refills | Status: DC

## 2018-11-03 MED ORDER — CYMBALTA 30 MG PO CPEP: 90.0000 mg | ORAL_CAPSULE | Freq: Every day | ORAL | 2 refills | Status: DC

## 2018-11-03 MED ORDER — HYDROCODONE-ACETAMINOPHEN 7.5-325 MG PO TABS: ORAL_TABLET | ORAL | 0 refills | Status: DC

## 2018-11-03 MED ORDER — TOPIRAMATE 100 MG PO TABS: ORAL_TABLET | ORAL | 4 refills | Status: DC

## 2018-11-03 NOTE — Progress Notes (Signed)
Subjective:    Patient ID: [name], female    DOB: 09/20/1966, 53 y.o.   MRN: [id number]  HPI: [name] is a 53 y.o. female who returns for follow up appointment for chronic pain and medication refill. She  states her  pain is located in her left arm with tingling and burning pain. She rates her pain 8. Her current exercise regime is walking.  [name] Morphine equivalent is 35.00 MME.  Last UDS was Performed on 07/10/2018, it was consistent.   Pain Inventory Average Pain 8 Pain Right Now 8 My pain is na  In the last 24 hours, has pain interfered with the following? General activity 5 Relation with others 5 Enjoyment of life 5 What TIME of day is your pain at its worst? all Sleep (in general) Poor  Pain is worse with: walking, bending, sitting, standing and some activites Pain improves with: heat/ice, therapy/exercise, pacing activities, medication, TENS and injections Relief from Meds: 7  Mobility walk without assistance ability to climb steps?  yes do you drive?  yes  Function disabled: date disabled 251994  Neuro/Psych bladder control problems weakness numbness tremor tingling trouble walking spasms dizziness depression anxiety loss of taste or smell  Prior Studies Any changes since last visit?  yes  Physicians involved in your care Any changes since last visit?  yes   Family History  Problem Relation Age of Onset  . Cancer Mother        lung  . Hypertension Mother   . Cancer Father        colon  . Hypertension Sister   . Cancer Sister   . Cancer Brother   . Hyperlipidemia Sister   . Birth defects Maternal Grandmother        breast  . Breast cancer Maternal Grandmother   . Birth defects Paternal Grandmother        uterine, stomach, lung  . Breast cancer Paternal Grandmother    Social History   Socioeconomic History  . Marital status: Married    Spouse name: Not on file  . Number of children: 2  . Years of  education: Not on file  . Highest education level: Not on file  Occupational History  . 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 Financial resource strain: Not on file  . Food insecurity:    Worry: Not on file    Inability: Not on file  . Transportation needs:    Medical: Not on file    Non-medical: Not on file  Tobacco Use  . Smoking status: Never Smoker  . Smokeless tobacco: Never Used  Substance and Sexual Activity  . Alcohol use: Yes    Alcohol/week: 0.0 standard drinks    Comment: 01/24/2013 "drink or 2 once or /twice/year, if that"  . Drug use: No  . Sexual activity: Not Currently  Lifestyle  . Physical activity:    Days per week: Not on file    Minutes per session: Not on file  . Stress: Not on file  Relationships  . Social connections:    Talks on phone: Not on file    Gets together: Not on file    Attends religious service: Not on file    Active member of club or organization: Not on file    Attends meetings of clubs or organizations: Not on file    Relationship status: Not on file  Other Topics Concern  . Not on file  Social History Narrative  No living will   Would want husband as POA--then sister, [name]   Would accept resuscitation attempts   Probably would not want tube feeds if cognitively unaware   Past Surgical History:  Procedure Laterality Date  . APPENDECTOMY  ~ 06/2007  . BLADDER REPAIR  ~ 06/2007   "same day after bladder lift" (01/24/2013)  . BLADDER SUSPENSION  ~ 06/2007  . BREAST BIOPSY Left   . DIAGNOSTIC LAPAROSCOPY  1990's & ~ 2000   "I've had a couple; for endometrosis" (01/24/2013)  . HERNIA REPAIR    . INCISIONAL HERNIA REPAIR N/A 01/24/2013   Procedure: LAPAROSCOPIC INCISIONAL HERNIA;  Surgeon: Shelly Rubenstein, MD;  Location: [facility] OR;  Service: General;  Laterality: N/A;  . INSERTION OF MESH N/A 01/24/2013   Procedure: INSERTION OF MESH;  Surgeon: Shelly Rubenstein, MD;  Location: [facility] OR;  Service: General;  Laterality: N/A;  .  LAPAROSCOPIC INCISIONAL / UMBILICAL / VENTRAL HERNIA REPAIR  01/24/2013   IHR w/mesh/notes  . NASAL SEPTUM SURGERY  1980's?  . OOPHORECTOMY Right 2009  . TONSILLECTOMY  1990's  . VAGINAL HYSTERECTOMY  ` 06/2007   Past Medical History:  Diagnosis Date  . Allergy   . Anxiety   . Arthritis    "just the norm" (01/24/2013)  . ASCVD (arteriosclerotic cardiovascular disease)   . Asthma   . Celiac disease   . Daily headache    "depends on the season" (01/24/2013)  . Fibromyalgia   . GERD (gastroesophageal reflux disease)   . History of colonic polyps   . Hyperlipidemia   . Hypertension   . Migraines   . Obesity (BMI 30.0-34.9)   . RSD (reflex sympathetic dystrophy)   . Sleep apnea    NO MACHINE RECOMMENDED  . TIA (transient ischemic attack) ~ 2009   BP 131/83   Pulse 62   Ht 5' 7.5" (1.715 m)   Wt 171 lb (77.6 kg)   SpO2 97%   BMI 26.39 kg/m   Opioid Risk Score:   Fall Risk Score:  `1  Depression screen PHQ 2/9  Depression screen [facility] 2/9 09/07/2018 05/09/2018 02/06/2018 01/09/2018 10/04/2017 09/05/2017 08/05/2017  Decreased Interest 1 1 1 1 1  0 0  Down, Depressed, Hopeless 1 1 1 1 1  0 0  PHQ - 2 Score 2 2 2 2 2  0 0  Altered sleeping - - 0 - - - -  Tired, decreased energy - - 1 - - - -  Change in appetite - - 0 - - - -  Feeling bad or failure about yourself  - - 0 - - - -  Trouble concentrating - - 0 - - - -  Moving slowly or fidgety/restless - - 0 - - - -  Suicidal thoughts - - 0 - - - -  PHQ-9 Score - - 3 - - - -  Difficult doing work/chores - - Not difficult at all - - - -  Some recent data might be hidden     Review of Systems  Constitutional: Negative.   HENT: Negative.   Eyes: Negative.   Respiratory: Negative.   Cardiovascular: Negative.   Gastrointestinal: Negative.   Endocrine: Negative.   Genitourinary: Positive for difficulty urinating.  Musculoskeletal: Positive for arthralgias, gait problem and myalgias.  Skin: Negative.   Allergic/Immunologic:  Negative.   Neurological: Positive for dizziness, tremors, weakness and numbness.  Hematological: Negative.   Psychiatric/Behavioral: Positive for dysphoric mood. The patient is nervous/anxious.   All other systems  reviewed and are negative.      Objective:   Physical Exam Vitals signs and nursing note reviewed.  Constitutional:      Appearance: Normal appearance.  Neck:     Musculoskeletal: Normal range of motion and neck supple.  Cardiovascular:     Rate and Rhythm: Normal rate and regular rhythm.     Pulses: Normal pulses.     Heart sounds: Normal heart sounds.  Pulmonary:     Effort: Pulmonary effort is normal.     Breath sounds: Normal breath sounds.  Musculoskeletal:     Comments: Normal Muscle Bulk and Muscle Testing Reveals:  Upper Extremities: Right: Full ROM and Muscle Strength 5/5 Left: Decreased ROM 90 Degrees and Muscle Strength 4/5 Left AC Joint Tenderness Thoracic Hypersensitivity: T-1-T-7  Lower Extremities: Full ROM and Muscle Strength 5/5 Arises from Table Slowly  Narrow Based Gait   Skin:    General: Skin is warm and dry.  Neurological:     Mental Status: She is alert and oriented to person, place, and time.  Psychiatric:        Mood and Affect: Mood normal.        Behavior: Behavior normal.           Assessment & Plan:  1.Complex Regional Pain Syndrome Type 1: Continue Topamax and Cymbalta.11/03/2018. Refilled:Hydro-codone 7.5/325mg  one tablet twice a day one in the morning and one at bedtime#60. Continue:Tramadol 100 mg ER daily #30 and Tramadol 50 mg one tablet by mouth BID. One tablet in the afternoon and one tablet in the evening. We will continue the opioid monitoring program, this consists of regular clinic visits, examinations, urine drug screen, pill counts as well as use of [place] Controlled Substance Reporting System. 2. Depression: Continuecurrent medication regimen withCymbalta.11/03/2018 3. Insomnia: Continuecurrent  medication regimen withTrazodone50 mg HS. 11/03/2018 4. Muscle Spasms: Continuecurrent medication regimen withTizanidine.11/03/2018 5. Chronic Pain Syndrome: Continue IbuprofenBID as needed.11/03/2018 . 20 minutes of face to face patient care time was spent during this visit. All questions were encouraged and answered.  F/U in 1 month

## 2018-11-03 NOTE — Telephone Encounter (Signed)
In order to send medication to the [email or web address] portal for Mrs. [name], we need to have a user name and password for the provider who is prescribing the medication to her.  After this is obtained, We need to upload a letter of medical necessity (aka doctors visit note) to the portal in order to have them agree to covering the medication.  This is the link to the portal on her workers comp:  [email or web address]

## 2018-11-06 NOTE — Telephone Encounter (Signed)
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  Lisa informed.

## 2018-11-09 DIAGNOSIS — R109 Unspecified abdominal pain: Secondary | ICD-10-CM | POA: Diagnosis not present

## 2018-11-14 ENCOUNTER — Other Ambulatory Visit: Payer: Self-pay

## 2018-11-14 MED ORDER — TIZANIDINE HCL 4 MG PO TABS: ORAL_TABLET | ORAL | 3 refills | Status: DC

## 2018-11-14 NOTE — Progress Notes (Signed)
Per patents request and office protocol

## 2018-11-29 ENCOUNTER — Other Ambulatory Visit: Payer: Self-pay

## 2018-11-29 ENCOUNTER — Encounter: Payer: Worker's Compensation | Attending: Registered Nurse | Admitting: Registered Nurse

## 2018-11-29 VITALS — BP 133/84 | HR 76 | Ht 67.5 in | Wt 167.0 lb

## 2018-11-29 DIAGNOSIS — G4700 Insomnia, unspecified: Secondary | ICD-10-CM | POA: Diagnosis not present

## 2018-11-29 DIAGNOSIS — I2510 Atherosclerotic heart disease of native coronary artery without angina pectoris: Secondary | ICD-10-CM | POA: Diagnosis not present

## 2018-11-29 DIAGNOSIS — Z760 Encounter for issue of repeat prescription: Secondary | ICD-10-CM | POA: Insufficient documentation

## 2018-11-29 DIAGNOSIS — M1990 Unspecified osteoarthritis, unspecified site: Secondary | ICD-10-CM | POA: Insufficient documentation

## 2018-11-29 DIAGNOSIS — F329 Major depressive disorder, single episode, unspecified: Secondary | ICD-10-CM | POA: Insufficient documentation

## 2018-11-29 DIAGNOSIS — M79642 Pain in left hand: Secondary | ICD-10-CM | POA: Diagnosis not present

## 2018-11-29 DIAGNOSIS — R200 Anesthesia of skin: Secondary | ICD-10-CM | POA: Diagnosis not present

## 2018-11-29 DIAGNOSIS — I10 Essential (primary) hypertension: Secondary | ICD-10-CM | POA: Insufficient documentation

## 2018-11-29 DIAGNOSIS — G9050 Complex regional pain syndrome I, unspecified: Secondary | ICD-10-CM | POA: Diagnosis not present

## 2018-11-29 DIAGNOSIS — K900 Celiac disease: Secondary | ICD-10-CM | POA: Insufficient documentation

## 2018-11-29 DIAGNOSIS — R42 Dizziness and giddiness: Secondary | ICD-10-CM | POA: Insufficient documentation

## 2018-11-29 DIAGNOSIS — M62838 Other muscle spasm: Secondary | ICD-10-CM | POA: Insufficient documentation

## 2018-11-29 DIAGNOSIS — R202 Paresthesia of skin: Secondary | ICD-10-CM | POA: Insufficient documentation

## 2018-11-29 DIAGNOSIS — M797 Fibromyalgia: Secondary | ICD-10-CM | POA: Diagnosis not present

## 2018-11-29 DIAGNOSIS — Z8249 Family history of ischemic heart disease and other diseases of the circulatory system: Secondary | ICD-10-CM | POA: Insufficient documentation

## 2018-11-29 DIAGNOSIS — Z79899 Other long term (current) drug therapy: Secondary | ICD-10-CM | POA: Insufficient documentation

## 2018-11-29 DIAGNOSIS — F419 Anxiety disorder, unspecified: Secondary | ICD-10-CM | POA: Diagnosis not present

## 2018-11-29 DIAGNOSIS — R439 Unspecified disturbances of smell and taste: Secondary | ICD-10-CM | POA: Insufficient documentation

## 2018-11-29 DIAGNOSIS — Z8673 Personal history of transient ischemic attack (TIA), and cerebral infarction without residual deficits: Secondary | ICD-10-CM | POA: Insufficient documentation

## 2018-11-29 DIAGNOSIS — E785 Hyperlipidemia, unspecified: Secondary | ICD-10-CM | POA: Diagnosis not present

## 2018-11-29 DIAGNOSIS — R251 Tremor, unspecified: Secondary | ICD-10-CM | POA: Diagnosis not present

## 2018-11-29 DIAGNOSIS — G894 Chronic pain syndrome: Secondary | ICD-10-CM | POA: Diagnosis not present

## 2018-11-29 DIAGNOSIS — R262 Difficulty in walking, not elsewhere classified: Secondary | ICD-10-CM | POA: Insufficient documentation

## 2018-11-29 DIAGNOSIS — F3289 Other specified depressive episodes: Secondary | ICD-10-CM | POA: Diagnosis not present

## 2018-11-29 DIAGNOSIS — G90512 Complex regional pain syndrome I of left upper limb: Secondary | ICD-10-CM | POA: Diagnosis not present

## 2018-11-29 DIAGNOSIS — M79602 Pain in left arm: Secondary | ICD-10-CM | POA: Diagnosis not present

## 2018-11-29 DIAGNOSIS — G4730 Sleep apnea, unspecified: Secondary | ICD-10-CM | POA: Insufficient documentation

## 2018-11-29 DIAGNOSIS — Z6830 Body mass index (BMI) 30.0-30.9, adult: Secondary | ICD-10-CM | POA: Insufficient documentation

## 2018-11-29 DIAGNOSIS — Z5181 Encounter for therapeutic drug level monitoring: Secondary | ICD-10-CM

## 2018-11-29 DIAGNOSIS — J45909 Unspecified asthma, uncomplicated: Secondary | ICD-10-CM | POA: Insufficient documentation

## 2018-11-29 DIAGNOSIS — E669 Obesity, unspecified: Secondary | ICD-10-CM | POA: Insufficient documentation

## 2018-11-29 MED ORDER — TRAZODONE HCL 50 MG PO TABS: ORAL_TABLET | ORAL | 3 refills | Status: DC

## 2018-11-29 MED ORDER — HYDROCODONE-ACETAMINOPHEN 7.5-325 MG PO TABS: ORAL_TABLET | ORAL | 0 refills | Status: DC

## 2018-11-29 NOTE — Progress Notes (Signed)
Subjective:    Patient ID: [name], female    DOB: [birth year], 53 y.o.   MRN: [id number]  HPI: [name] is a 53 y.o. female who returns for follow up appointment for chronic pain and medication refill. She states her pain is located in her left arm , left wist and left hand with tingling and numbness. She rates her  Pain 10. Her current exercise regime is walking.  [name] Morphine equivalent is 35.00 MME.  Last UDS was Performed on 07/10/2018, it was consistent.   Pain Inventory Average Pain 7 Pain Right Now 10 My pain is constant, sharp, burning, stabbing, tingling and aching  In the last 24 hours, has pain interfered with the following? General activity 2 Relation with others 2 Enjoyment of life 2 What TIME of day is your pain at its worst? all Sleep (in general) Poor  Pain is worse with: walking, bending, sitting, standing and some activites Pain improves with: heat/ice, therapy/exercise, pacing activities and medication Relief from Meds: 7  Mobility ability to climb steps?  yes do you drive?  yes  Function disabled: date disabled 67 I need assistance with the following:  meal prep, household duties and shopping  Neuro/Psych bladder control problems weakness numbness tremor tingling trouble walking spasms dizziness depression anxiety loss of taste or smell  Prior Studies Any changes since last visit?  no  Physicians involved in your care Any changes since last visit?  no   Family History  Problem Relation Age of Onset  . Cancer Mother        lung  . Hypertension Mother   . Cancer Father        colon  . Hypertension Sister   . Cancer Sister   . Cancer Brother   . Hyperlipidemia Sister   . Birth defects Maternal Grandmother        breast  . Breast cancer Maternal Grandmother   . Birth defects Paternal Grandmother        uterine, stomach, lung  . Breast cancer Paternal Grandmother    Social History    Socioeconomic History  . Marital status: Married    Spouse name: Not on file  . Number of children: 2  . Years of education: Not on file  . Highest education level: Not on file  Occupational History  . 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 Financial resource strain: Not on file  . Food insecurity:    Worry: Not on file    Inability: Not on file  . Transportation needs:    Medical: Not on file    Non-medical: Not on file  Tobacco Use  . Smoking status: Never Smoker  . Smokeless tobacco: Never Used  Substance and Sexual Activity  . Alcohol use: Yes    Alcohol/week: 0.0 standard drinks    Comment: 01/24/2013 "drink or 2 once or /twice/year, if that"  . Drug use: No  . Sexual activity: Not Currently  Lifestyle  . Physical activity:    Days per week: Not on file    Minutes per session: Not on file  . Stress: Not on file  Relationships  . Social connections:    Talks on phone: Not on file    Gets together: Not on file    Attends religious service: Not on file    Active member of club or organization: Not on file    Attends meetings of clubs or organizations: Not on file  Relationship status: Not on file  Other Topics Concern  . Not on file  Social History Narrative   No living will   Would want husband as POA--then sister, Suzanne PoundDeborah   Would accept resuscitation attempts   Probably would not want tube feeds if cognitively unaware   Past Surgical History:  Procedure Laterality Date  . APPENDECTOMY  ~ 06/2007  . BLADDER REPAIR  ~ 06/2007   "same day after bladder lift" (01/24/2013)  . BLADDER SUSPENSION  ~ 06/2007  . BREAST BIOPSY Left   . DIAGNOSTIC LAPAROSCOPY  1990's & ~ 2000   "I've had a couple; for endometrosis" (01/24/2013)  . HERNIA REPAIR    . INCISIONAL HERNIA REPAIR N/A 01/24/2013   Procedure: LAPAROSCOPIC INCISIONAL HERNIA;  Surgeon: Shelly Rubensteinouglas A Blackman, MD;  [facility];  Service: General;  Laterality: N/A;  . INSERTION OF MESH N/A 01/24/2013    Procedure: INSERTION OF MESH;  Surgeon: Shelly Rubensteinouglas A Blackman, MD;  Location: [facility] OR;  Service: General;  Laterality: N/A;  . LAPAROSCOPIC INCISIONAL / UMBILICAL / VENTRAL HERNIA REPAIR  01/24/2013   IHR w/mesh/notes  . NASAL SEPTUM SURGERY  1980's?  . OOPHORECTOMY Right 2009  . TONSILLECTOMY  1990's  . VAGINAL HYSTERECTOMY  ` 06/2007   Past Medical History:  Diagnosis Date  . Allergy   . Anxiety   . Arthritis    "just the norm" (01/24/2013)  . ASCVD (arteriosclerotic cardiovascular disease)   . Asthma   . Celiac disease   . Daily headache    "depends on the season" (01/24/2013)  . Fibromyalgia   . GERD (gastroesophageal reflux disease)   . History of colonic polyps   . Hyperlipidemia   . Hypertension   . Migraines   . Obesity (BMI 30.0-34.9)   . RSD (reflex sympathetic dystrophy)   . Sleep apnea    NO MACHINE RECOMMENDED  . TIA (transient ischemic attack) ~ 2009   BP 133/84   Pulse 76   Ht 5' 7.5" (1.715 m)   Wt 167 lb (75.8 kg)   SpO2 94%   BMI 25.77 kg/m   Opioid Risk Score:   Fall Risk Score:  `1  Depression screen PHQ 2/9  [facility] 11/29/2018 09/07/2018 05/09/2018 02/06/2018 01/09/2018 10/04/2017 09/05/2017  Decreased Interest 1 1 1 1 1 1  0  Down, Depressed, Hopeless 1 1 1 1 1 1  0  PHQ - 2 Score 2 2 2 2 2 2  0  Altered sleeping - - - 0 - - -  Tired, decreased energy - - - 1 - - -  Change in appetite - - - 0 - - -  Feeling bad or failure about yourself  - - - 0 - - -  Trouble concentrating - - - 0 - - -  Moving slowly or fidgety/restless - - - 0 - - -  Suicidal thoughts - - - 0 - - -  PHQ-9 Score - - - 3 - - -  Difficult doing work/chores - - - Not difficult at all - - -  Some recent data might be hidden    Review of Systems  Constitutional: Negative.   HENT: Negative.   Eyes: Negative.   Respiratory: Negative.   Cardiovascular: Negative.   Gastrointestinal: Negative.   Endocrine: Negative.   Genitourinary: Negative.   Musculoskeletal:  Negative.   Skin: Negative.   Allergic/Immunologic: Negative.   Neurological: Negative.   Hematological: Negative.   Psychiatric/Behavioral: Negative.   All other systems  reviewed and are negative.      Objective:   Physical Exam Vitals signs and nursing note reviewed.  Constitutional:      Appearance: Normal appearance.  Neck:     Musculoskeletal: Normal range of motion and neck supple.  Cardiovascular:     Rate and Rhythm: Normal rate and regular rhythm.     Pulses: Normal pulses.     Heart sounds: Normal heart sounds.  Pulmonary:     Effort: Pulmonary effort is normal.     Breath sounds: Normal breath sounds.  Musculoskeletal:     Comments: Normal Muscle Bulk and Muscle Testing Reveals:  Upper Extremities: Right: Full ROM and Muscle Strength 4/5 Left: Decreased ROM 90 Degrees and Muscle Strength 3/5 Bilateral AC Joint Tenderness Thoracic Hypersensitivity: T-1-T-7 Lumbar Hypersensitivity Lower Extremities: Decreased ROM and Muscle Strength 4/5  Bilateral Lower Extremities Flexion Produces Pain into Bilateral Lower Extremities and Bilateral Feet Arises from Table Slowly Antalgic Gait   Skin:    General: Skin is warm and dry.  Neurological:     Mental Status: She is alert and oriented to person, place, and time.  Psychiatric:        Mood and Affect: Mood normal.        Behavior: Behavior normal.           Assessment & Plan:  1.Complex Regional Pain Syndrome Type 1: Continue Topamax and Cymbalta.11/29/2018. Refilled:Hydro-codone 7.5/325mg  one tablet twice a day one in the morning and one at bedtime#60. Continue:Tramadol 100 mg ER daily #30 and Tramadol 50 mg one tablet by mouth BID. One tablet in the afternoon and one tablet in the evening. We will continue the opioid monitoring program, this consists of regular clinic visits, examinations, urine drug screen, pill counts as well as use of [place] Controlled Substance Reporting System. 2. Depression:  Continuecurrent medication regimen withCymbalta.11/29/2018 3. Insomnia: Continuecurrent medication regimen withTrazodone50 mg HS. 11/29/2018 4. Muscle Spasms: Continuecurrent medication regimen withTizanidine.11/29/2018 5. Chronic Pain Syndrome: Continue IbuprofenBID as needed.11/29/2018 . 20 minutes of face to face patient care time was spent during this visit. All questions were encouraged and answered.  F/U in 1 month

## 2018-12-04 ENCOUNTER — Encounter: Payer: Self-pay | Admitting: Registered Nurse

## 2018-12-21 ENCOUNTER — Other Ambulatory Visit: Payer: Self-pay | Admitting: Internal Medicine

## 2018-12-27 ENCOUNTER — Encounter: Payer: Self-pay | Admitting: Registered Nurse

## 2018-12-27 ENCOUNTER — Encounter: Payer: Worker's Compensation | Attending: Physical Medicine & Rehabilitation | Admitting: Registered Nurse

## 2018-12-27 ENCOUNTER — Other Ambulatory Visit: Payer: Self-pay

## 2018-12-27 VITALS — BP 114/77 | HR 80 | Ht 67.5 in | Wt 164.0 lb

## 2018-12-27 DIAGNOSIS — G90512 Complex regional pain syndrome I of left upper limb: Secondary | ICD-10-CM

## 2018-12-27 DIAGNOSIS — G4700 Insomnia, unspecified: Secondary | ICD-10-CM

## 2018-12-27 DIAGNOSIS — M62838 Other muscle spasm: Secondary | ICD-10-CM | POA: Diagnosis present

## 2018-12-27 DIAGNOSIS — G894 Chronic pain syndrome: Secondary | ICD-10-CM | POA: Insufficient documentation

## 2018-12-27 DIAGNOSIS — Z79899 Other long term (current) drug therapy: Secondary | ICD-10-CM | POA: Insufficient documentation

## 2018-12-27 DIAGNOSIS — Z5181 Encounter for therapeutic drug level monitoring: Secondary | ICD-10-CM | POA: Diagnosis not present

## 2018-12-27 DIAGNOSIS — F3289 Other specified depressive episodes: Secondary | ICD-10-CM | POA: Insufficient documentation

## 2018-12-27 MED ORDER — TRAMADOL HCL 50 MG PO TABS: ORAL_TABLET | ORAL | 2 refills | Status: DC

## 2018-12-27 MED ORDER — HYDROCODONE-ACETAMINOPHEN 7.5-325 MG PO TABS: ORAL_TABLET | ORAL | 0 refills | Status: DC

## 2018-12-27 MED ORDER — TRAMADOL HCL ER 100 MG PO TB24: 100.0000 mg | ORAL_TABLET | Freq: Every day | ORAL | 2 refills | Status: DC

## 2018-12-27 NOTE — Progress Notes (Signed)
Subjective:    Patient ID: [name], female    DOB: 25-Sep-1966, 53 y.o.   MRN: [id number]  HPI: [name] is a 53 y.o. female who returns for follow up appointment for chronic pain and medication refill. She states her pain is located in her left wrist and left arm. She rates her pain 7. Her. current exercise regime is walking.  [name] Morphine equivalent is 35.00 MME. Last UDS was Performed on 07/10/2018, it was consistent.   Pain Inventory Average Pain 8 Pain Right Now 7 My pain is sharp, burning, stabbing, tingling and aching  In the last 24 hours, has pain interfered with the following? General activity 4 Relation with others 4 Enjoyment of life 4 What TIME of day is your pain at its worst? daytime evening night Sleep (in general) Poor  Pain is worse with: walking, bending, sitting, standing and some activites Pain improves with: rest, heat/ice, therapy/exercise, pacing activities, medication, TENS and injections Relief from Meds: 6  Mobility ability to climb steps?  yes do you drive?  yes  Function disabled: date disabled 12/94 I need assistance with the following:  meal prep, household duties and shopping  Neuro/Psych bladder control problems weakness numbness tremor tingling trouble walking spasms dizziness confusion depression anxiety  Prior Studies Any changes since last visit?  no  Physicians involved in your care Any changes since last visit?  no   Family History  Problem Relation Age of Onset  . Cancer Mother        lung  . Hypertension Mother   . Cancer Father        colon  . Hypertension Sister   . Cancer Sister   . Cancer Brother   . Hyperlipidemia Sister   . Birth defects Maternal Grandmother        breast  . Breast cancer Maternal Grandmother   . Birth defects Paternal Grandmother        uterine, stomach, lung  . Breast cancer Paternal Grandmother    Social History   Socioeconomic History  .  Marital status: Married    Spouse name: Not on file  . Number of children: 2  . Years of education: Not on file  . Highest education level: Not on file  Occupational History  . 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 Financial resource strain: Not on file  . Food insecurity:    Worry: Not on file    Inability: Not on file  . Transportation needs:    Medical: Not on file    Non-medical: Not on file  Tobacco Use  . Smoking status: Never Smoker  . Smokeless tobacco: Never Used  Substance and Sexual Activity  . Alcohol use: Yes    Alcohol/week: 0.0 standard drinks    Comment: 01/24/2013 "drink or 2 once or /twice/year, if that"  . Drug use: No  . Sexual activity: Not Currently  Lifestyle  . Physical activity:    Days per week: Not on file    Minutes per session: Not on file  . Stress: Not on file  Relationships  . Social connections:    Talks on phone: Not on file    Gets together: Not on file    Attends religious service: Not on file    Active member of club or organization: Not on file    Attends meetings of clubs or organizations: Not on file    Relationship status: Not on file  Other Topics Concern  .  Not on file  Social History Narrative   No living will   Would want husband as POA--then sister, [name]   Would accept resuscitation attempts   Probably would not want tube feeds if cognitively unaware   Past Surgical History:  Procedure Laterality Date  . APPENDECTOMY  ~ 06/2007  . BLADDER REPAIR  ~ 06/2007   "same day after bladder lift" (01/24/2013)  . BLADDER SUSPENSION  ~ 06/2007  . BREAST BIOPSY Left   . DIAGNOSTIC LAPAROSCOPY  1990's & ~ 2000   "I've had a couple; for endometrosis" (01/24/2013)  . HERNIA REPAIR    . INCISIONAL HERNIA REPAIR N/A 01/24/2013   Procedure: LAPAROSCOPIC INCISIONAL HERNIA;  Surgeon: Shelly Rubenstein, MD;  Location: [facility];  Service: General;  Laterality: N/A;  . INSERTION OF MESH N/A 01/24/2013   Procedure: INSERTION OF MESH;   Surgeon: Shelly Rubenstein, MD;  Location: [facility] OR;  Service: General;  Laterality: N/A;  . LAPAROSCOPIC INCISIONAL / UMBILICAL / VENTRAL HERNIA REPAIR  01/24/2013   IHR w/mesh/notes  . NASAL SEPTUM SURGERY  1980's?  . OOPHORECTOMY Right 2009  . TONSILLECTOMY  1990's  . VAGINAL HYSTERECTOMY  ` 06/2007   Past Medical History:  Diagnosis Date  . Allergy   . Anxiety   . Arthritis    "just the norm" (01/24/2013)  . ASCVD (arteriosclerotic cardiovascular disease)   . Asthma   . Celiac disease   . Daily headache    "depends on the season" (01/24/2013)  . Fibromyalgia   . GERD (gastroesophageal reflux disease)   . History of colonic polyps   . Hyperlipidemia   . Hypertension   . Migraines   . Obesity (BMI 30.0-34.9)   . RSD (reflex sympathetic dystrophy)   . Sleep apnea    NO MACHINE RECOMMENDED  . TIA (transient ischemic attack) ~ 2009   BP 114/77   Pulse 80   Ht 5' 7.5" (1.715 m)   Wt 164 lb (74.4 kg)   SpO2 96%   BMI 25.31 kg/m   Opioid Risk Score:   Fall Risk Score:  `1  Depression screen PHQ 2/9  Depression screen [facility] 2/9 11/29/2018 09/07/2018 05/09/2018 02/06/2018 01/09/2018 10/04/2017 09/05/2017  Decreased Interest 1 1 1 1 1 1  0  Down, Depressed, Hopeless 1 1 1 1 1 1  0  PHQ - 2 Score 2 2 2 2 2 2  0  Altered sleeping - - - 0 - - -  Tired, decreased energy - - - 1 - - -  Change in appetite - - - 0 - - -  Feeling bad or failure about yourself  - - - 0 - - -  Trouble concentrating - - - 0 - - -  Moving slowly or fidgety/restless - - - 0 - - -  Suicidal thoughts - - - 0 - - -  PHQ-9 Score - - - 3 - - -  Difficult doing work/chores - - - Not difficult at all - - -  Some recent data might be hidden    Review of Systems  Constitutional: Negative.   HENT: Negative.   Eyes: Negative.   Respiratory: Negative.   Cardiovascular: Negative.   Gastrointestinal: Negative.   Endocrine: Negative.   Genitourinary: Negative.   Musculoskeletal: Negative.        Spasms   Skin:  Negative.   Allergic/Immunologic: Negative.   Neurological: Positive for dizziness, tremors, weakness and numbness.       Tingling  Hematological: Negative.  Psychiatric/Behavioral: Positive for dysphoric mood. The patient is nervous/anxious.   All other systems reviewed and are negative.      Objective:   Physical Exam Vitals signs and nursing note reviewed.  Constitutional:      Appearance: Normal appearance.  Neck:     Musculoskeletal: Normal range of motion and neck supple.  Cardiovascular:     Rate and Rhythm: Normal rate and regular rhythm.     Pulses: Normal pulses.     Heart sounds: Normal heart sounds.  Pulmonary:     Effort: Pulmonary effort is normal.     Breath sounds: Normal breath sounds.  Musculoskeletal:     Comments: Normal Muscle Bulk and Muscle Testing Reveals:  Upper Extremities:Right : Full  ROM and Muscle Strength 4/5  Left: Decreased ROM and Muscle Strength 4/5  Thoracic Hypersensitivity: T-1-T-3  Lumbar Hypersensitivity Lower Extremities: Decreased ROM and Muscle Strength 4/5 Bilateral Lower Extremities Flexion Produces Pain into Bilateral Lower Extremity and Bilateral Feet Arises from Table Slowly Narrow Based Gait   Skin:    General: Skin is warm and dry.  Neurological:     Mental Status: She is alert and oriented to person, place, and time.  Psychiatric:        Mood and Affect: Mood normal.        Behavior: Behavior normal.           Assessment & Plan:  1.Complex Regional Pain Syndrome Type 1: Continue Topamax and Cymbalta.12/27/2018. Refilled:Hydro-codone 7.5/325mg  one tablet twice a day one in the morning and one at bedtime#60. Continue:Tramadol 100 mg ER daily #30 and Tramadol 50 mg one tablet by mouth BID. One tablet in the afternoon and one tablet in the evening. We will continue the opioid monitoring program, this consists of regular clinic visits, examinations, urine drug screen, pill counts as well as use of [place]  Controlled Substance Reporting System. 2. Depression: Continuecurrent medication regimen withCymbalta.12/27/2018 3. Insomnia: Continuecurrent medication regimen withTrazodone50 mg HS. 12/27/2018 4. Muscle Spasms: Continuecurrent medication regimen withTizanidine.12/27/2018 5. Chronic Pain Syndrome: Continue IbuprofenBID as needed.12/27/2018 . 20 minutes of face to face patient care time was spent during this visit. All questions were encouraged and answered.  F/U in 1 month

## 2019-01-01 ENCOUNTER — Ambulatory Visit: Payer: Self-pay | Admitting: Physical Medicine & Rehabilitation

## 2019-01-23 ENCOUNTER — Ambulatory Visit: Payer: Self-pay | Admitting: Physical Medicine & Rehabilitation

## 2019-01-29 ENCOUNTER — Encounter: Payer: Self-pay | Admitting: Registered Nurse

## 2019-01-29 ENCOUNTER — Other Ambulatory Visit: Payer: Self-pay

## 2019-01-29 ENCOUNTER — Encounter: Payer: Worker's Compensation | Attending: Registered Nurse | Admitting: Registered Nurse

## 2019-01-29 VITALS — Ht 67.5 in | Wt 164.0 lb

## 2019-01-29 DIAGNOSIS — G90512 Complex regional pain syndrome I of left upper limb: Secondary | ICD-10-CM

## 2019-01-29 DIAGNOSIS — Z5181 Encounter for therapeutic drug level monitoring: Secondary | ICD-10-CM | POA: Diagnosis not present

## 2019-01-29 DIAGNOSIS — Z79899 Other long term (current) drug therapy: Secondary | ICD-10-CM | POA: Diagnosis not present

## 2019-01-29 DIAGNOSIS — M62838 Other muscle spasm: Secondary | ICD-10-CM

## 2019-01-29 DIAGNOSIS — G894 Chronic pain syndrome: Secondary | ICD-10-CM | POA: Diagnosis not present

## 2019-01-29 DIAGNOSIS — F3289 Other specified depressive episodes: Secondary | ICD-10-CM

## 2019-01-29 DIAGNOSIS — G4700 Insomnia, unspecified: Secondary | ICD-10-CM

## 2019-01-29 MED ORDER — TIZANIDINE HCL 4 MG PO TABS: ORAL_TABLET | ORAL | 3 refills | Status: DC

## 2019-01-29 MED ORDER — HYDROCODONE-ACETAMINOPHEN 7.5-325 MG PO TABS: ORAL_TABLET | ORAL | 0 refills | Status: DC

## 2019-01-29 NOTE — Progress Notes (Signed)
Subjective:    Patient ID: [name], female    DOB: 08/12/1966, 53 y.o.   MRN: [id number]  HPI: [name] is a 53 y.o. female her appointment was changed, due to national recommendations of social distancing due to [place] 19, an audio/video telehealth visit is felt to be most appropriate for this patient at this time.  See Chart message from today for the patient's consent to telehealth from [place].    She states her pain is located in her left hand and left wrist. She rates her pain 9. Her  current exercise regime is walking.   [name] Morphine equivalent is 35.00 MME. Last UDS was Performed on 07/10/2018, it was consistent.    [name] CMA asked the Health and History Questions. This provider and Mancel Parsons  verified we speaking with the correct person using two identifiers.   Pain Inventory Average Pain 8 Pain Right Now 9 My pain is constant, sharp, burning, dull, stabbing, tingling and aching  In the last 24 hours, has pain interfered with the following? General activity 7 Relation with others 7 Enjoyment of life 7 What TIME of day is your pain at its worst? varies with activity Sleep (in general) Poor  Pain is worse with: walking, bending, sitting, inactivity, standing and some activites Pain improves with: rest, heat/ice, therapy/exercise, pacing activities, medication and TENS Relief from Meds: 7  Mobility walk without assistance how many minutes can you walk? 15-20 ability to climb steps?  yes do you drive?  yes  Function disabled: date disabled .  Neuro/Psych bladder control problems bowel control problems weakness numbness tremor tingling trouble walking spasms dizziness confusion depression anxiety  Prior Studies Any changes since last visit?  no  Physicians involved in your care Any changes since last visit?  no   Family History  Problem Relation Age of Onset  .  Cancer Mother        lung  . Hypertension Mother   . Cancer Father        colon  . Hypertension Sister   . Cancer Sister   . Cancer Brother   . Hyperlipidemia Sister   . Birth defects Maternal Grandmother        breast  . Breast cancer Maternal Grandmother   . Birth defects Paternal Grandmother        uterine, stomach, lung  . Breast cancer Paternal Grandmother    Social History   Socioeconomic History  . Marital status: Married    Spouse name: Not on file  . Number of children: 2  . Years of education: Not on file  . Highest education level: Not on file  Occupational History  . 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 Financial resource strain: Not on file  . Food insecurity:    Worry: Not on file    Inability: Not on file  . Transportation needs:    Medical: Not on file    Non-medical: Not on file  Tobacco Use  . Smoking status: Never Smoker  . Smokeless tobacco: Never Used  Substance and Sexual Activity  . Alcohol use: Yes    Alcohol/week: 0.0 standard drinks    Comment: 01/24/2013 "drink or 2 once or /twice/year, if that"  . Drug use: No  . Sexual activity: Not Currently  Lifestyle  . Physical activity:    Days per week: Not on file    Minutes per session: Not on file  .  Stress: Not on file  Relationships  . Social connections:    Talks on phone: Not on file    Gets together: Not on file    Attends religious service: Not on file    Active member of club or organization: Not on file    Attends meetings of clubs or organizations: Not on file    Relationship status: Not on file  Other Topics Concern  . Not on file  Social History Narrative   No living will   Would want husband as POA--then sister, Neoma Laming   Would accept resuscitation attempts   Probably would not want tube feeds if cognitively unaware   Past Surgical History:  Procedure Laterality Date  . APPENDECTOMY  ~ 06/2007  . BLADDER REPAIR  ~ 06/2007   "same day after bladder lift"  (01/24/2013)  . BLADDER SUSPENSION  ~ 06/2007  . BREAST BIOPSY Left   . DIAGNOSTIC LAPAROSCOPY  1990's & ~ 2000   "I've had a couple; for endometrosis" (01/24/2013)  . HERNIA REPAIR    . INCISIONAL HERNIA REPAIR N/A 01/24/2013   Procedure: LAPAROSCOPIC INCISIONAL HERNIA;  Surgeon: Harl Bowie, MD;  Location: [facility];  Service: General;  Laterality: N/A;  . INSERTION OF MESH N/A 01/24/2013   Procedure: INSERTION OF MESH;  Surgeon: Harl Bowie, MD;  Location: [facility] [place];  Service: General;  Laterality: N/A;  . LAPAROSCOPIC INCISIONAL / UMBILICAL / [facility]  01/24/2013   IHR w/mesh/notes  . NASAL SEPTUM SURGERY  1980's?  . OOPHORECTOMY Right 2009  . TONSILLECTOMY  1990's  . VAGINAL HYSTERECTOMY  ` 06/2007   Past Medical History:  Diagnosis Date  . Allergy   . Anxiety   . Arthritis    "just the norm" (01/24/2013)  . ASCVD (arteriosclerotic cardiovascular disease)   . Asthma   . Celiac disease   . Daily headache    "depends on the season" (01/24/2013)  . Fibromyalgia   . GERD (gastroesophageal reflux disease)   . History of colonic polyps   . Hyperlipidemia   . Hypertension   . Migraines   . Obesity (BMI 30.0-34.9)   . RSD (reflex sympathetic dystrophy)   . Sleep apnea    NO MACHINE RECOMMENDED  . TIA (transient ischemic attack) ~ 2009   Ht 5' 7.5" (1.715 m)   Wt 164 lb (74.4 kg)   BMI 25.31 kg/m   Opioid Risk Score:   Fall Risk Score:  `1  Depression screen PHQ 2/9  Depression screen [facility] 2/9 11/29/2018 09/07/2018 05/09/2018 02/06/2018 01/09/2018 10/04/2017 09/05/2017  Decreased Interest 1 1 1 1 1 1  0  Down, Depressed, Hopeless 1 1 1 1 1 1  0  PHQ - 2 Score 2 2 2 2 2 2  0  Altered sleeping - - - 0 - - -  Tired, decreased energy - - - 1 - - -  Change in appetite - - - 0 - - -  Feeling bad or failure about yourself  - - - 0 - - -  Trouble concentrating - - - 0 - - -  Moving slowly or fidgety/restless - - - 0 - - -  Suicidal thoughts - - - 0 - - -  PHQ-9 Score - - -  3 - - -  Difficult doing work/chores - - - Not difficult at all - - -  Some recent data might be hidden    Review of Systems  Constitutional: Negative.   HENT: Negative.  Eyes: Negative.   Gastrointestinal: Positive for constipation and nausea.  Endocrine: Negative.   Genitourinary:       Incontinence  Musculoskeletal: Positive for arthralgias and gait problem.       Spasms  Skin: Negative.   Allergic/Immunologic: Negative.   Neurological: Positive for dizziness, tremors, weakness and numbness.       Tingling  Psychiatric/Behavioral: Positive for dysphoric mood. The patient is nervous/anxious.   All other systems reviewed and are negative.      Objective:   Physical Exam Vitals signs and nursing note reviewed.  Musculoskeletal:     Comments: No Physical Exam Performed: Virtual Visit           Assessment & Plan:  1.Complex Regional Pain Syndrome Type 1: Continue Topamax and Cymbalta.01/29/2019. Refilled:Hydro-codone 7.5/325mg one tablet twice a day one in the morning and one at bedtime#60. Continue:Tramadol 100 mg ER daily #30 and Tramadol 50 mg one tablet by mouth BID. One tablet in the afternoon and one tablet in the evening. We will continue the opioid monitoring program, this consists of regular clinic visits, examinations, urine drug screen, pill counts as well as use of [place] Controlled Substance Reporting System. 2. Depression: Continuecurrent medication regimen withCymbalta.01/29/2019 3. Insomnia: Continuecurrent medication regimen withTrazodone50 mg HS. 01/29/2019 4. Muscle Spasms: Continuecurrent medication regimen withTizanidine.01/29/2019 5. Chronic Pain Syndrome: Continue IbuprofenBID as needed.01/30/2019  F/U in 1 month  Telephone call  Location of patient: In her Home Location of provider: Office Established patient Time spent on call: 10 minutes

## 2019-02-01 ENCOUNTER — Other Ambulatory Visit: Payer: Self-pay | Admitting: Registered Nurse

## 2019-02-26 ENCOUNTER — Other Ambulatory Visit: Payer: Self-pay

## 2019-02-26 ENCOUNTER — Encounter
Payer: Worker's Compensation | Attending: Physical Medicine & Rehabilitation | Admitting: Physical Medicine & Rehabilitation

## 2019-02-26 ENCOUNTER — Encounter: Payer: Self-pay | Admitting: Physical Medicine & Rehabilitation

## 2019-02-26 VITALS — Ht 67.5 in | Wt 164.0 lb

## 2019-02-26 DIAGNOSIS — G9050 Complex regional pain syndrome I, unspecified: Secondary | ICD-10-CM | POA: Diagnosis not present

## 2019-02-26 DIAGNOSIS — Z1331 Encounter for screening for depression: Secondary | ICD-10-CM | POA: Diagnosis not present

## 2019-02-26 DIAGNOSIS — G90512 Complex regional pain syndrome I of left upper limb: Secondary | ICD-10-CM | POA: Diagnosis not present

## 2019-02-26 MED ORDER — HYDROCODONE-ACETAMINOPHEN 7.5-325 MG PO TABS: ORAL_TABLET | ORAL | 0 refills | Status: DC

## 2019-02-26 MED ORDER — LIDOCAINE 5 % EX PTCH: MEDICATED_PATCH | CUTANEOUS | 4 refills | Status: DC

## 2019-02-26 NOTE — Progress Notes (Signed)
Subjective:    Patient ID: [name], female    DOB: [birth year], 53 y.o.   MRN: [id number]  HPI 53 yo female with hx of RSD which started in LUE after a wrist injury in 1994 while employed in [place].  She has developed symptoms of probable RSD in LLE as well. She has maintained a independent level of functioning on her current complex pain medication regimen.  She has been weaned off hi dose narcotics (>90 MME) several years ago. Pain Inventory Average Pain 8 Pain Right Now 7 My pain is constant, burning and aching  In the last 24 hours, has pain interfered with the following? General activity 7 Relation with others 7 Enjoyment of life 7 What TIME of day is your pain at its worst? all Sleep (in general) Poor  Pain is worse with: some activites Pain improves with: pacing activities and medication Relief from Meds: 3  Mobility ability to climb steps?  yes do you drive?  yes  Function disabled: date disabled na I need assistance with the following:  meal prep, household duties and shopping  Neuro/Psych weakness numbness tingling  Prior Studies Any changes since last visit?  no  Physicians involved in your care Any changes since last visit?  no   Family History  Problem Relation Age of Onset  . Cancer Mother        lung  . Hypertension Mother   . Cancer Father        colon  . Hypertension Sister   . Cancer Sister   . Cancer Brother   . Hyperlipidemia Sister   . Birth defects Maternal Grandmother        breast  . Breast cancer Maternal Grandmother   . Birth defects Paternal Grandmother        uterine, stomach, lung  . Breast cancer Paternal Grandmother    Social History   Socioeconomic History  . Marital status: Married    Spouse name: Not on file  . Number of children: 2  . Years of education: Not on file  . Highest education level: Not on file  Occupational History  . 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 Financial resource  strain: Not on file  . Food insecurity:    Worry: Not on file    Inability: Not on file  . Transportation needs:    Medical: Not on file    Non-medical: Not on file  Tobacco Use  . Smoking status: Never Smoker  . Smokeless tobacco: Never Used  Substance and Sexual Activity  . Alcohol use: Yes    Alcohol/week: 0.0 standard drinks    Comment: 01/24/2013 "drink or 2 once or /twice/year, if that"  . Drug use: No  . Sexual activity: Not Currently  Lifestyle  . Physical activity:    Days per week: Not on file    Minutes per session: Not on file  . Stress: Not on file  Relationships  . Social connections:    Talks on phone: Not on file    Gets together: Not on file    Attends religious service: Not on file    Active member of club or organization: Not on file    Attends meetings of clubs or organizations: Not on file    Relationship status: Not on file  Other Topics Concern  . Not on file  Social History Narrative   No living will   Would want husband as POA--then sister, Neoma Laming  Would accept resuscitation attempts   Probably would not want tube feeds if cognitively unaware   Past Surgical History:  Procedure Laterality Date  . APPENDECTOMY  ~ 06/2007  . BLADDER REPAIR  ~ 06/2007   "same day after bladder lift" (01/24/2013)  . BLADDER SUSPENSION  ~ 06/2007  . BREAST BIOPSY Left   . DIAGNOSTIC LAPAROSCOPY  1990's & ~ 2000   "I've had a couple; for endometrosis" (01/24/2013)  . HERNIA REPAIR    . INCISIONAL HERNIA REPAIR N/A 01/24/2013   Procedure: LAPAROSCOPIC INCISIONAL HERNIA;  Surgeon: Harl Bowie, MD;  Location: [facility];  Service: General;  Laterality: N/A;  . INSERTION OF MESH N/A 01/24/2013   Procedure: INSERTION OF MESH;  Surgeon: Harl Bowie, MD;  Location: [facility];  Service: General;  Laterality: N/A;  . LAPAROSCOPIC INCISIONAL / UMBILICAL / [place]  01/24/2013   IHR w/mesh/notes  . NASAL SEPTUM SURGERY  1980's?  . OOPHORECTOMY Right 2009  .  TONSILLECTOMY  1990's  . VAGINAL HYSTERECTOMY  ` 06/2007   Past Medical History:  Diagnosis Date  . Allergy   . Anxiety   . Arthritis    "just the norm" (01/24/2013)  . ASCVD (arteriosclerotic cardiovascular disease)   . Asthma   . Celiac disease   . Daily headache    "depends on the season" (01/24/2013)  . Fibromyalgia   . GERD (gastroesophageal reflux disease)   . History of colonic polyps   . Hyperlipidemia   . Hypertension   . Migraines   . Obesity (BMI 30.0-34.9)   . RSD (reflex sympathetic dystrophy)   . Sleep apnea    NO MACHINE RECOMMENDED  . TIA (transient ischemic attack) ~ 2009   Ht 5' 7.5" (1.715 m)   Wt 164 lb (74.4 kg)   BMI 25.31 kg/m   Opioid Risk Score:   Fall Risk Score:  `1  Depression screen PHQ 2/9  Depression screen [facility] 2/9 02/26/2019 11/29/2018 09/07/2018 05/09/2018 02/06/2018 01/09/2018 10/04/2017  Decreased Interest 1 1 1 1 1 1 1   Down, Depressed, Hopeless 1 1 1 1 1 1 1   PHQ - 2 Score 2 2 2 2 2 2 2   Altered sleeping - - - - 0 - -  Tired, decreased energy - - - - 1 - -  Change in appetite - - - - 0 - -  Feeling bad or failure about yourself  - - - - 0 - -  Trouble concentrating - - - - 0 - -  Moving slowly or fidgety/restless - - - - 0 - -  Suicidal thoughts - - - - 0 - -  PHQ-9 Score - - - - 3 - -  Difficult doing work/chores - - - - Not difficult at all - -  Some recent data might be hidden    Review of Systems  Constitutional: Negative.   HENT: Negative.   Eyes: Negative.   Respiratory: Negative.   Cardiovascular: Negative.   Gastrointestinal: Negative.   Endocrine: Negative.   Genitourinary: Negative.   Musculoskeletal: Positive for joint swelling.       Left wrist  Skin: Positive for color change.  Allergic/Immunologic: Negative.   Neurological: Positive for weakness and numbness.  Hematological: Negative.   Psychiatric/Behavioral: Negative.   All other systems reviewed and are negative.      Objective:   Physical Exam   Oriented x 3 Speech is without dysarthria Mood and Affect are appropriate  Physical exam is  deferred due to phone visit      Assessment & Plan:   1.  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  Has been controlled on a stable but complex  Regimen of medication   Cont Hydrocodone 7.36m BID- would not recommend escalating this dose any further Tizanidine 455mQID Tramadol ER 10040mhs Tramadol 62m80mD Topiramate 100mg63m and 200Mg qhs Trazodone 62mg 12mPt also taking OTC ibuprofen Was on Lidoderm but was denied by insurance will reorder, also discussed that OTC version may also be effective  NP visit in 106mo  C53moDuration 12min 569m

## 2019-03-09 ENCOUNTER — Other Ambulatory Visit: Payer: Self-pay | Admitting: Registered Nurse

## 2019-03-17 ENCOUNTER — Other Ambulatory Visit: Payer: Self-pay | Admitting: Registered Nurse

## 2019-03-19 ENCOUNTER — Other Ambulatory Visit: Payer: Self-pay | Admitting: Internal Medicine

## 2019-03-27 ENCOUNTER — Encounter: Payer: Worker's Compensation | Attending: Physical Medicine & Rehabilitation | Admitting: Registered Nurse

## 2019-03-27 ENCOUNTER — Other Ambulatory Visit: Payer: Self-pay

## 2019-03-27 ENCOUNTER — Encounter: Payer: Self-pay | Admitting: Registered Nurse

## 2019-03-27 VITALS — Ht 67.5 in | Wt 164.0 lb

## 2019-03-27 DIAGNOSIS — M62838 Other muscle spasm: Secondary | ICD-10-CM | POA: Insufficient documentation

## 2019-03-27 DIAGNOSIS — Z79899 Other long term (current) drug therapy: Secondary | ICD-10-CM | POA: Diagnosis not present

## 2019-03-27 DIAGNOSIS — F3289 Other specified depressive episodes: Secondary | ICD-10-CM | POA: Insufficient documentation

## 2019-03-27 DIAGNOSIS — G4700 Insomnia, unspecified: Secondary | ICD-10-CM | POA: Insufficient documentation

## 2019-03-27 DIAGNOSIS — Z5181 Encounter for therapeutic drug level monitoring: Secondary | ICD-10-CM | POA: Diagnosis not present

## 2019-03-27 DIAGNOSIS — G894 Chronic pain syndrome: Secondary | ICD-10-CM | POA: Insufficient documentation

## 2019-03-27 DIAGNOSIS — G90512 Complex regional pain syndrome I of left upper limb: Secondary | ICD-10-CM | POA: Diagnosis not present

## 2019-03-27 MED ORDER — TRAMADOL HCL 50 MG PO TABS: ORAL_TABLET | ORAL | 2 refills | Status: DC

## 2019-03-27 MED ORDER — TRAMADOL HCL ER 100 MG PO TB24: 100.0000 mg | ORAL_TABLET | Freq: Every day | ORAL | 2 refills | Status: DC

## 2019-03-27 MED ORDER — HYDROCODONE-ACETAMINOPHEN 7.5-325 MG PO TABS: ORAL_TABLET | ORAL | 0 refills | Status: DC

## 2019-03-27 NOTE — Progress Notes (Signed)
Subjective:    Patient ID: [name], female    DOB: 10/06/1966, 53 y.o.   MRN: [id number]  HPI: [name] is a 53 y.o. female her appointment was changed, due to national recommendations of social distancing due to [name], an audio/video telehealth visit is felt to be most appropriate for this patient at this time.  See Chart message from today for the patient's consent to telehealth from [name].     She states her pain is located in her left wrist and left hand. She rates her pain 8. Her current exercise regime is walking.   [name] equivalent is 35.00 MME.  Last UDS was Performed on 07/10/2018, it was consistent.   [name] CMA asked the Health and History Questions. This provider and [name] verified we were speaking with the correct person using two identifiers.  Pain Inventory Average Pain 8 Pain Right Now 8 My pain is constant, sharp, burning, dull, stabbing, tingling and aching  In the last 24 hours, has pain interfered with the following? General activity 7 Relation with others 7 Enjoyment of life 7 What TIME of day is your pain at its worst? always Sleep (in general) Poor  Pain is worse with: walking, bending, sitting, inactivity, standing and some activites Pain improves with: rest, pacing activities and medication Relief from Meds: 7  Mobility walk without assistance how many minutes can you walk? 10-15 ability to climb steps?  yes do you drive?  yes  Function disabled: date disabled .  Neuro/Psych bladder control problems weakness numbness tremor tingling trouble walking spasms dizziness depression anxiety  Prior Studies Any changes since last visit?  no  Physicians involved in your care Any changes since last visit?  no   Family History  Problem Relation Age of Onset  . Cancer Mother        lung  . Hypertension Mother   . Cancer Father    colon  . Hypertension Sister   . Cancer Sister   . Cancer Brother   . Hyperlipidemia Sister   . Birth defects Maternal Grandmother        breast  . Breast cancer Maternal Grandmother   . Birth defects Paternal Grandmother        uterine, stomach, lung  . Breast cancer Paternal Grandmother    Social History   Socioeconomic History  . Marital status: Married    Spouse name: Not on file  . Number of children: 2  . Years of education: Not on file  . Highest education level: Not on file  Occupational History  . 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 Financial resource strain: Not on file  . Food insecurity:    Worry: Not on file    Inability: Not on file  . Transportation needs:    Medical: Not on file    Non-medical: Not on file  Tobacco Use  . Smoking status: Never Smoker  . Smokeless tobacco: Never Used  Substance and Sexual Activity  . Alcohol use: Yes    Alcohol/week: 0.0 standard drinks    Comment: 01/24/2013 "drink or 2 once or /twice/year, if that"  . Drug use: No  . Sexual activity: Not Currently  Lifestyle  . Physical activity:    Days per week: Not on file    Minutes per session: Not on file  . Stress: Not on file  Relationships  . Social connections:    Talks  on phone: Not on file    Gets together: Not on file    Attends religious service: Not on file    Active member of club or organization: Not on file    Attends meetings of clubs or organizations: Not on file    Relationship status: Not on file  Other Topics Concern  . Not on file  Social History Narrative   No living will   Would want husband as POA--then sister, [name]   Would accept resuscitation attempts   Probably would not want tube feeds if cognitively unaware   Past Surgical History:  Procedure Laterality Date  . APPENDECTOMY  ~ 06/2007  . BLADDER REPAIR  ~ 06/2007   "same day after bladder lift" (01/24/2013)  . BLADDER SUSPENSION  ~ 06/2007  . BREAST BIOPSY Left   . DIAGNOSTIC  LAPAROSCOPY  1990's & ~ 2000   "I've had a couple; for endometrosis" (01/24/2013)  . HERNIA REPAIR    . INCISIONAL HERNIA REPAIR N/A 01/24/2013   Procedure: LAPAROSCOPIC INCISIONAL HERNIA;  Surgeon: Harl Bowie, MD;  Location: [facility];  Service: General;  Laterality: N/A;  . INSERTION OF MESH N/A 01/24/2013   Procedure: INSERTION OF MESH;  Surgeon: Harl Bowie, MD;  Location: [facility];  Service: General;  Laterality: N/A;  . LAPAROSCOPIC INCISIONAL / UMBILICAL / [place]  01/24/2013   IHR w/mesh/notes  . NASAL SEPTUM SURGERY  1980's?  . OOPHORECTOMY Right 2009  . TONSILLECTOMY  1990's  . VAGINAL HYSTERECTOMY  ` 06/2007   Past Medical History:  Diagnosis Date  . Allergy   . Anxiety   . Arthritis    "just the norm" (01/24/2013)  . ASCVD (arteriosclerotic cardiovascular disease)   . Asthma   . Celiac disease   . Daily headache    "depends on the season" (01/24/2013)  . Fibromyalgia   . GERD (gastroesophageal reflux disease)   . History of colonic polyps   . Hyperlipidemia   . Hypertension   . Migraines   . Obesity (BMI 30.0-34.9)   . RSD (reflex sympathetic dystrophy)   . Sleep apnea    NO MACHINE RECOMMENDED  . TIA (transient ischemic attack) ~ 2009   Ht 5' 7.5" (1.715 m)   Wt 164 lb (74.4 kg)   BMI 25.31 kg/m   Opioid Risk Score:   Fall Risk Score:  `1  Depression screen PHQ 2/9  Depression screen [facility] 2/9 02/26/2019 11/29/2018 09/07/2018 05/09/2018 02/06/2018 01/09/2018 10/04/2017  Decreased Interest 1 1 1 1 1 1 1   Down, Depressed, Hopeless 1 1 1 1 1 1 1   PHQ - 2 Score 2 2 2 2 2 2 2   Altered sleeping - - - - 0 - -  Tired, decreased energy - - - - 1 - -  Change in appetite - - - - 0 - -  Feeling bad or failure about yourself  - - - - 0 - -  Trouble concentrating - - - - 0 - -  Moving slowly or fidgety/restless - - - - 0 - -  Suicidal thoughts - - - - 0 - -  PHQ-9 Score - - - - 3 - -  Difficult doing work/chores - - - - Not difficult at all - -  Some  recent data might be hidden     Review of Systems  Constitutional: Negative.   HENT: Negative.   Eyes: Negative.   Respiratory: Negative.   Cardiovascular: Negative.   Gastrointestinal:  Negative.   Endocrine: Negative.   Genitourinary: Negative.   Musculoskeletal: Positive for arthralgias.       Spasms   Skin: Negative.   Neurological: Positive for dizziness, tremors, weakness and numbness.       Tingling   Psychiatric/Behavioral: Positive for dysphoric mood. The patient is nervous/anxious.   All other systems reviewed and are negative.      Objective:   Physical Exam Vitals signs and nursing note reviewed.  Musculoskeletal:     Comments: No Physical Exam Performed: Virtual Visit  Neurological:     Mental Status: She is oriented to person, place, and time.           Assessment & Plan:  1.Complex Regional Pain Syndrome Type 1: Continue Topamax and Cymbalta.03/27/2019. Refilled:Hydro-codone 7.5/325mg one tablet twice a day one in the morning and one at bedtime#60. Continue:Tramadol 100 mg ER daily #30 and Tramadol 50 mg one tablet by mouth BID. One tablet in the afternoon and one tablet in the evening. We will continue the opioid monitoring program, this consists of regular clinic visits, examinations, urine drug screen, pill counts as well as use of [place] Controlled Substance Reporting System. 2. Depression: Continuecurrent medication regimen withCymbalta.03/27/2019 3. Insomnia: Continuecurrent medication regimen withTrazodone50 mg HS. 03/27/2019 4. Muscle Spasms: Continuecurrent medication regimen withTizanidine.03/27/2019 5. Chronic Pain Syndrome: Continue IbuprofenBID as needed.03/27/2019  F/U in 1 month  Telephone call  Location of patient: In her Home Location of provider: Office Established patient Time spent on call: 10 minutes

## 2019-04-25 DIAGNOSIS — Z1159 Encounter for screening for other viral diseases: Secondary | ICD-10-CM | POA: Diagnosis not present

## 2019-04-25 DIAGNOSIS — K219 Gastro-esophageal reflux disease without esophagitis: Secondary | ICD-10-CM | POA: Diagnosis not present

## 2019-04-25 DIAGNOSIS — K2270 Barrett's esophagus without dysplasia: Secondary | ICD-10-CM | POA: Diagnosis not present

## 2019-04-26 ENCOUNTER — Other Ambulatory Visit: Payer: Self-pay | Admitting: Internal Medicine

## 2019-04-26 ENCOUNTER — Encounter: Payer: Worker's Compensation | Attending: Registered Nurse | Admitting: Registered Nurse

## 2019-04-26 ENCOUNTER — Encounter: Payer: Self-pay | Admitting: Registered Nurse

## 2019-04-26 ENCOUNTER — Other Ambulatory Visit: Payer: Self-pay

## 2019-04-26 VITALS — BP 129/82 | HR 78 | Temp 97.7°F | Ht 67.0 in | Wt 169.0 lb

## 2019-04-26 DIAGNOSIS — G4700 Insomnia, unspecified: Secondary | ICD-10-CM | POA: Diagnosis present

## 2019-04-26 DIAGNOSIS — G90512 Complex regional pain syndrome I of left upper limb: Secondary | ICD-10-CM

## 2019-04-26 DIAGNOSIS — F3289 Other specified depressive episodes: Secondary | ICD-10-CM | POA: Insufficient documentation

## 2019-04-26 DIAGNOSIS — G894 Chronic pain syndrome: Secondary | ICD-10-CM | POA: Diagnosis not present

## 2019-04-26 DIAGNOSIS — Z79899 Other long term (current) drug therapy: Secondary | ICD-10-CM | POA: Insufficient documentation

## 2019-04-26 DIAGNOSIS — M62838 Other muscle spasm: Secondary | ICD-10-CM | POA: Insufficient documentation

## 2019-04-26 DIAGNOSIS — Z5181 Encounter for therapeutic drug level monitoring: Secondary | ICD-10-CM | POA: Insufficient documentation

## 2019-04-26 MED ORDER — HYDROCODONE-ACETAMINOPHEN 7.5-325 MG PO TABS: ORAL_TABLET | ORAL | 0 refills | Status: DC

## 2019-04-26 MED ORDER — TIZANIDINE HCL 4 MG PO TABS: ORAL_TABLET | ORAL | 3 refills | Status: DC

## 2019-04-26 NOTE — Progress Notes (Signed)
Subjective:    Patient ID: [name], female    DOB: 11-27-1965, 53 y.o.   MRN: [id number]  HPI: [name] is a 53 y.o. female who returns for follow up appointment for chronic pain and medication refill. She states her pain is located in her left arm. She rates her pain 7. Her  current exercise regime is walking.  [name] Morphine equivalent is 35.00 MME.  Her last UDS was Performed on 07/10/2018, it was consistent. Oral Swab was Performed today.   Pain Inventory Average Pain 7 Pain Right Now 7 My pain is constant, sharp, burning, dull, stabbing, tingling and aching  In the last 24 hours, has pain interfered with the following? General activity 4 Relation with others 4 Enjoyment of life 4 What TIME of day is your pain at its worst? all the time Sleep (in general) Poor  Pain is worse with: walking, bending, sitting and standing Pain improves with: heat/ice, therapy/exercise, pacing activities and medication Relief from Meds: 6  Mobility do you drive?  yes  Function disabled: date disabled 09/1993  Neuro/Psych bladder control problems weakness numbness tremor tingling trouble walking spasms dizziness confusion depression anxiety  Prior Studies no  Physicians involved in your care no   Family History  Problem Relation Age of Onset  . Cancer Mother        lung  . Hypertension Mother   . Cancer Father        colon  . Hypertension Sister   . Cancer Sister   . Cancer Brother   . Hyperlipidemia Sister   . Birth defects Maternal Grandmother        breast  . Breast cancer Maternal Grandmother   . Birth defects Paternal Grandmother        uterine, stomach, lung  . Breast cancer Paternal Grandmother    Social History   Socioeconomic History  . Marital status: Married    Spouse name: Not on file  . Number of children: 2  . Years of education: Not on file  . Highest education level: Not on file  Occupational History  .  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 Financial resource strain: Not on file  . Food insecurity    Worry: Not on file    Inability: Not on file  . Transportation needs    Medical: Not on file    Non-medical: Not on file  Tobacco Use  . Smoking status: Never Smoker  . Smokeless tobacco: Never Used  Substance and Sexual Activity  . Alcohol use: Yes    Alcohol/week: 0.0 standard drinks    Comment: 01/24/2013 "drink or 2 once or /twice/year, if that"  . Drug use: No  . Sexual activity: Not Currently  Lifestyle  . Physical activity    Days per week: Not on file    Minutes per session: Not on file  . Stress: Not on file  Relationships  . Social Herbalist on phone: Not on file    Gets together: Not on file    Attends religious service: Not on file    Active member of club or organization: Not on file    Attends meetings of clubs or organizations: Not on file    Relationship status: Not on file  Other Topics Concern  . Not on file  Social History Narrative   No living will   Would want husband as POA--then sister, [name]   Would accept resuscitation attempts  Probably would not want tube feeds if cognitively unaware   Past Surgical History:  Procedure Laterality Date  . APPENDECTOMY  ~ 06/2007  . BLADDER REPAIR  ~ 06/2007   "same day after bladder lift" (01/24/2013)  . BLADDER SUSPENSION  ~ 06/2007  . BREAST BIOPSY Left   . DIAGNOSTIC LAPAROSCOPY  1990's & ~ 2000   "I've had a couple; for endometrosis" (01/24/2013)  . HERNIA REPAIR    . INCISIONAL HERNIA REPAIR N/A 01/24/2013   Procedure: LAPAROSCOPIC INCISIONAL HERNIA;  Surgeon: Shelly Rubensteinouglas A Blackman, MD;  [facility];  Service: General;  Laterality: N/A;  . INSERTION OF MESH N/A 01/24/2013   Procedure: INSERTION OF MESH;  Surgeon: Shelly Rubensteinouglas A Blackman, MD;  Location: [facility] OR;  Service: General;  Laterality: N/A;  . LAPAROSCOPIC INCISIONAL / UMBILICAL / [facility] HERNIA REPAIR  01/24/2013   IHR w/mesh/notes  .  NASAL SEPTUM SURGERY  1980's?  . OOPHORECTOMY Right 2009  . TONSILLECTOMY  1990's  . VAGINAL HYSTERECTOMY  ` 06/2007   Past Medical History:  Diagnosis Date  . Allergy   . Anxiety   . Arthritis    "just the norm" (01/24/2013)  . ASCVD (arteriosclerotic cardiovascular disease)   . Asthma   . Celiac disease   . Daily headache    "depends on the season" (01/24/2013)  . Fibromyalgia   . GERD (gastroesophageal reflux disease)   . History of colonic polyps   . Hyperlipidemia   . Hypertension   . Migraines   . Obesity (BMI 30.0-34.9)   . RSD (reflex sympathetic dystrophy)   . Sleep apnea    NO MACHINE RECOMMENDED  . TIA (transient ischemic attack) ~ 2009   There were no vitals taken for this visit.  Opioid Risk Score:   Fall Risk Score:  `1  Depression screen PHQ 2/9  [facility] 02/26/2019 11/29/2018 09/07/2018 05/09/2018 02/06/2018 01/09/2018 10/04/2017  Decreased Interest 1 1 1 1 1 1 1   Down, Depressed, Hopeless 1 1 1 1 1 1 1   PHQ - 2 Score 2 2 2 2 2 2 2   Altered sleeping - - - - 0 - -  Tired, decreased energy - - - - 1 - -  Change in appetite - - - - 0 - -  Feeling bad or failure about yourself  - - - - 0 - -  Trouble concentrating - - - - 0 - -  Moving slowly or fidgety/restless - - - - 0 - -  Suicidal thoughts - - - - 0 - -  PHQ-9 Score - - - - 3 - -  Difficult doing work/chores - - - - Not difficult at all - -  Some recent data might be hidden     Review of Systems  All other systems reviewed and are negative.      Objective:   Physical Exam Vitals signs and nursing note reviewed.  Constitutional:      Appearance: Normal appearance.  Neck:     Musculoskeletal: Normal range of motion and neck supple.  Cardiovascular:     Rate and Rhythm: Normal rate and regular rhythm.     Pulses: Normal pulses.     Heart sounds: Normal heart sounds.  Pulmonary:     Effort: Pulmonary effort is normal.     Breath sounds: Normal breath sounds.  Musculoskeletal:      Comments: Normal Muscle Bulk and Muscle Testing Reveals:  Upper Extremities: Full ROM and Muscle Strength on  the Right 4/5 and Left 3/5  Lower Extremities: Full ROM and Muscle Strength 4/5 Bilateral Lower Extremities Flexion Produces Pain into her bilateral Lower Extremities:  Arises from Table Slowly Antalgic gait  Skin:    General: Skin is warm and dry.  Neurological:     Mental Status: She is alert and oriented to person, place, and time.           Assessment & Plan:  1.Complex Regional Pain Syndrome Type 1: Continue Topamax and Cymbalta.04/26/2019. Refilled:Hydro-codone 7.5/325mg  one tablet twice a day one in the morning and one at bedtime#60. Continue:Tramadol 100 mg ER daily #30 and Tramadol 50 mg one tablet by mouth BID. One tablet in the afternoon and one tablet in the evening. We will continue the opioid monitoring program, this consists of regular clinic visits, examinations, urine drug screen, pill counts as well as use of [place] Controlled Substance [place]. 2. Depression: Continuecurrent medication regimen withCymbalta.04/26/2019 3. Insomnia: Continuecurrent medication regimen withTrazodone50 mg HS. 04/26/2019 4. Muscle Spasms: Continuecurrent medication regimen withTizanidine.04/26/2019 5. Chronic Pain Syndrome: Continue IbuprofenBID as needed.04/26/2019  F/U in 1 month   15  minutes of face to face patient care time was spent during this visit. All questions were encouraged and answered.

## 2019-04-27 ENCOUNTER — Other Ambulatory Visit: Payer: Self-pay | Admitting: Internal Medicine

## 2019-04-27 NOTE — Telephone Encounter (Signed)
Pharmacy comment: Product Backordered/Unavailable:PLEASE SEND ALTERNATIVE THERAPY AS APPROPRIATE.  They listed olmesartan 40mg  as a substitute

## 2019-05-02 LAB — DRUG TOX MONITOR 1 W/CONF, ORAL FLD
Amphetamines: NEGATIVE ng/mL (ref <10)
Barbiturates: NEGATIVE ng/mL (ref <10)
Benzodiazepines: NEGATIVE ng/mL (ref <0.50)
Buprenorphine: NEGATIVE ng/mL (ref <0.10)
Cocaine: NEGATIVE ng/mL (ref <5.0)
Codeine: NEGATIVE ng/mL (ref <2.5)
Dihydrocodeine: 3 ng/mL — ABNORMAL HIGH (ref <2.5)
Fentanyl: NEGATIVE ng/mL (ref <0.10)
Heroin Metabolite: NEGATIVE ng/mL (ref <1.0)
Hydrocodone: 44.1 ng/mL — ABNORMAL HIGH (ref <2.5)
Hydromorphone: NEGATIVE ng/mL (ref <2.5)
MARIJUANA: NEGATIVE ng/mL (ref <2.5)
MDMA: NEGATIVE ng/mL (ref <10)
Meprobamate: NEGATIVE ng/mL (ref <2.5)
Methadone: NEGATIVE ng/mL (ref <5.0)
Morphine: NEGATIVE ng/mL (ref <2.5)
Nicotine Metabolite: NEGATIVE ng/mL (ref <5.0)
Norhydrocodone: 5.1 ng/mL — ABNORMAL HIGH (ref <2.5)
Noroxycodone: NEGATIVE ng/mL (ref <2.5)
Opiates: POSITIVE ng/mL — AB (ref <2.5)
Oxycodone: NEGATIVE ng/mL (ref <2.5)
Oxymorphone: NEGATIVE ng/mL (ref <2.5)
Phencyclidine: NEGATIVE ng/mL (ref <10)
Tapentadol: NEGATIVE ng/mL (ref <5.0)
Tramadol: >500 ng/mL — ABNORMAL HIGH (ref <5.0)
Tramadol: POSITIVE ng/mL — AB (ref <5.0)
Zolpidem: NEGATIVE ng/mL (ref <5.0)

## 2019-05-02 LAB — DRUG TOX ALC METAB W/CON, ORAL FLD: Alcohol Metabolite: NEGATIVE ng/mL (ref <25)

## 2019-05-03 ENCOUNTER — Telehealth: Payer: Self-pay | Admitting: *Deleted

## 2019-05-03 NOTE — Telephone Encounter (Signed)
Urine drug screen for this encounter is consistent for prescribed medication 

## 2019-05-08 ENCOUNTER — Other Ambulatory Visit: Payer: Self-pay | Admitting: Internal Medicine

## 2019-05-09 ENCOUNTER — Telehealth: Payer: Self-pay | Admitting: Physical Medicine & Rehabilitation

## 2019-05-09 ENCOUNTER — Encounter: Payer: Self-pay | Admitting: *Deleted

## 2019-05-09 NOTE — Telephone Encounter (Signed)
Patient is needing a letter of medical necessity wrote and sent to attorney's office about her Cymbalta.  She needs it to say in letter that she is allergic to generic brand and needs brand name.  Please send to the attention of Christina at [phone].

## 2019-05-09 NOTE — Telephone Encounter (Signed)
Letter faxed as requested. Mychart message sent to pt notifying her that it was sent.

## 2019-05-10 ENCOUNTER — Other Ambulatory Visit: Payer: Self-pay | Admitting: Internal Medicine

## 2019-05-22 ENCOUNTER — Ambulatory Visit (INDEPENDENT_AMBULATORY_CARE_PROVIDER_SITE_OTHER): Payer: Medicare HMO | Admitting: Internal Medicine

## 2019-05-22 ENCOUNTER — Other Ambulatory Visit: Payer: Self-pay

## 2019-05-22 ENCOUNTER — Encounter: Payer: Self-pay | Admitting: Internal Medicine

## 2019-05-22 VITALS — BP 118/84 | HR 82 | Temp 98.6°F | Ht 67.0 in | Wt 165.0 lb

## 2019-05-22 DIAGNOSIS — Z0000 Encounter for general adult medical examination without abnormal findings: Secondary | ICD-10-CM | POA: Diagnosis not present

## 2019-05-22 DIAGNOSIS — I10 Essential (primary) hypertension: Secondary | ICD-10-CM

## 2019-05-22 DIAGNOSIS — G90529 Complex regional pain syndrome I of unspecified lower limb: Secondary | ICD-10-CM | POA: Diagnosis not present

## 2019-05-22 DIAGNOSIS — F39 Unspecified mood [affective] disorder: Secondary | ICD-10-CM

## 2019-05-22 DIAGNOSIS — R69 Illness, unspecified: Secondary | ICD-10-CM | POA: Diagnosis not present

## 2019-05-22 DIAGNOSIS — K219 Gastro-esophageal reflux disease without esophagitis: Secondary | ICD-10-CM

## 2019-05-22 DIAGNOSIS — R7303 Prediabetes: Secondary | ICD-10-CM | POA: Insufficient documentation

## 2019-05-22 DIAGNOSIS — F1120 Opioid dependence, uncomplicated: Secondary | ICD-10-CM | POA: Insufficient documentation

## 2019-05-22 DIAGNOSIS — K900 Celiac disease: Secondary | ICD-10-CM | POA: Diagnosis not present

## 2019-05-22 LAB — CBC
HCT: 42.2 % (ref 36.0–46.0)
Hemoglobin: 13.9 g/dL (ref 12.0–15.0)
MCHC: 33 g/dL (ref 30.0–36.0)
MCV: 88.7 fl (ref 78.0–100.0)
Platelets: 240 10*3/uL (ref 150.0–400.0)
RBC: 4.76 Mil/uL (ref 3.87–5.11)
RDW: 13.3 % (ref 11.5–15.5)
WBC: 7.7 10*3/uL (ref 4.0–10.5)

## 2019-05-22 LAB — COMPREHENSIVE METABOLIC PANEL
ALT: 9 U/L (ref 0–35)
AST: 12 U/L (ref 0–37)
Albumin: 4.7 g/dL (ref 3.5–5.2)
Alkaline Phosphatase: 92 U/L (ref 39–117)
BUN: 14 mg/dL (ref 6–23)
CO2: 25 mEq/L (ref 19–32)
Calcium: 9.7 mg/dL (ref 8.4–10.5)
Chloride: 107 mEq/L (ref 96–112)
Creatinine, Ser: 0.98 mg/dL (ref 0.40–1.20)
GFR: 58.88 mL/min — ABNORMAL LOW (ref >60.00)
Glucose, Bld: 95 mg/dL (ref 70–99)
Potassium: 4.2 mEq/L (ref 3.5–5.1)
Sodium: 140 mEq/L (ref 135–145)
Total Bilirubin: 0.5 mg/dL (ref 0.2–1.2)
Total Protein: 6.9 g/dL (ref 6.0–8.3)

## 2019-05-22 LAB — T4, FREE: Free T4: 0.91 ng/dL (ref 0.60–1.60)

## 2019-05-22 LAB — HEMOGLOBIN A1C: Hgb A1c MFr Bld: 6.2 % (ref 4.6–6.5)

## 2019-05-22 LAB — LDL CHOLESTEROL, DIRECT: Direct LDL: 145 mg/dL

## 2019-05-22 LAB — LIPID PANEL
Cholesterol: 251 mg/dL — ABNORMAL HIGH (ref 0–200)
HDL: 35.6 mg/dL — ABNORMAL LOW (ref >39.00)
NonHDL: 215.33
Total CHOL/HDL Ratio: 7
Triglycerides: 360 mg/dL — ABNORMAL HIGH (ref 0.0–149.0)
VLDL: 72 mg/dL — ABNORMAL HIGH (ref 0.0–40.0)

## 2019-05-22 MED ORDER — ALBUTEROL SULFATE HFA 108 (90 BASE) MCG/ACT IN AERS: 2.0000 | INHALATION_SPRAY | Freq: Four times a day (QID) | RESPIRATORY_TRACT | 1 refills | Status: DC | PRN

## 2019-05-22 MED ORDER — KETOCONAZOLE 2 % EX CREA: 1.0000 "application " | TOPICAL_CREAM | Freq: Every day | CUTANEOUS | 1 refills | Status: DC

## 2019-05-22 NOTE — Assessment & Plan Note (Signed)
Controlled with gluten free diet

## 2019-05-22 NOTE — Assessment & Plan Note (Signed)
Managed at pain center 

## 2019-05-22 NOTE — Progress Notes (Signed)
Subjective:    Patient ID: [name], female    DOB: 01/31/1966, 53 y.o.   MRN: [id number]  HPI Here for Medicare wellness visit and follow up of chronic health conditions Reviewed form and advanced directives Reviewed other doctors No alcohol or tobacco Not able to exercise due to chronic pain Vision is okay--needs check up Hearing is mostly okay--no functional problems (other than in noisy crowded room) Did trip a couple of times--no injury. Relates to her chronic foot problems Does housework with husband Mild memory issues persist--no changes  Rash under her right breast and lower abdomen for about a week Some burning if she scratches it No new products, bras, etc  "anxiety through the roof with this [employer] stuff' Only goes out to get food--home otherwise Depression is worse--every day and more severe Panicked last night about today's appt  Chronic pain is about the same May be worse at times related to her anxiety Gets her pain meds from the pain doctors Using biofeedback with some success Only taking the trazodone for sleep, and 90mg  of cymbalta (60/30)  Celiac disease fairly quiet Recent GI follow up Feels the hiatal hernia adds to this--on PPI also Still gets some heartburn with spicy foods. No dysphagia Does stick to gluten free  Current Outpatient Medications on File Prior to Visit  Medication Sig Dispense Refill  . albuterol (PROVENTIL HFA;VENTOLIN HFA) 108 (90 Base) MCG/ACT inhaler Inhale 2 puffs into the lungs every 6 (six) hours as needed for wheezing or shortness of breath. 1 Inhaler 5  . CYMBALTA 30 MG capsule Take 3 capsules (90 mg total) by mouth daily. 270 capsule 2  . esomeprazole (NEXIUM) 40 MG capsule TAKE 1 CAPSULE (40 MG TOTAL) BY MOUTH 2 (TWO) TIMES DAILY. QTY/DAY SUPPLY PER INS 90 capsule 0  . HYDROcodone-acetaminophen (NORCO) 7.5-325 MG tablet TAKE 1 TABLET Twice a Day. One in the Morning and one at Bedtime 60 tablet 0  . ibuprofen  (ADVIL,MOTRIN) 600 MG tablet Take 1 tablet (600 mg total) by mouth 2 (two) times daily as needed for mild pain. 90 tablet 8  . lidocaine (LIDODERM) 5 % MAY USE UP TO 2 PATCHES DAILY. REMOVE AND DISCARED PATCHES AFTER 12 HOURS OR AS DIRECTED 60 patch 4  . loratadine (CLARITIN) 10 MG tablet Take 10 mg by mouth daily.      . metoCLOPramide (REGLAN) 10 MG tablet Take 10 mg by mouth 2 (two) times daily.    . montelukast (SINGULAIR) 10 MG tablet TAKE 1 TABLET BY MOUTH EVERYDAY AT BEDTIME 90 tablet 0  . olmesartan (BENICAR) 40 MG tablet Take 1 tablet (40 mg total) by mouth daily. 90 tablet 3  . tiZANidine (ZANAFLEX) 4 MG tablet TAKE 1 TABLET (4 MG TOTAL) BY MOUTH 4 (FOUR) TIMES DAILY. 120 tablet 3  . topiramate (TOPAMAX) 100 MG tablet TAKE 1 TABLET BY MOUTH EVERY MORNING AND TAKE 2 TABS EVERY EVENING. 90 tablet 3  . traMADol (ULTRAM) 50 MG tablet TAKE 1 TABLET BY MOUTH TWICE A DAY. DIRECTION CHANGE 60 tablet 2  . traMADol (ULTRAM-ER) 100 MG 24 hr tablet Take 1 tablet (100 mg total) by mouth daily. 30 tablet 2  . traZODone (DESYREL) 50 MG tablet TAKE ONE AND ONE-HALF TABLETS BY MOUTH AT BEDTIME 45 tablet 3  . clobetasol cream (TEMOVATE) 0.05 % Apply 1 application topically 2 (two) times daily. (Patient not taking: Reported on 05/22/2019) 30 g 0  . [DISCONTINUED] losartan (COZAAR) 100 MG tablet TAKE 1 TABLET BY  MOUTH EVERY DAY 90 tablet 0   No current facility-administered medications on file prior to visit.     Allergies  Allergen Reactions  . Demerol [Meperidine] Anaphylaxis, Itching and Swelling  . Cymbalta [Duloxetine Hcl] Hives    Generic makes her feel like she is crawling out of her skin  . Diazepam     REACTION: swelling  . Meperidine Hcl     REACTION: swelling  . Other     Axe Cologne    Past Medical History:  Diagnosis Date  . Allergy   . Anxiety   . Arthritis    "just the norm" (01/24/2013)  . ASCVD (arteriosclerotic cardiovascular disease)   . Asthma   . Celiac disease   . Daily  headache    "depends on the season" (01/24/2013)  . Fibromyalgia   . GERD (gastroesophageal reflux disease)   . History of colonic polyps   . Hyperlipidemia   . Hypertension   . Migraines   . Obesity (BMI 30.0-34.9)   . RSD (reflex sympathetic dystrophy)   . Sleep apnea    NO MACHINE RECOMMENDED  . TIA (transient ischemic attack) ~ 2009    Past Surgical History:  Procedure Laterality Date  . APPENDECTOMY  ~ 06/2007  . BLADDER REPAIR  ~ 06/2007   "same day after bladder lift" (01/24/2013)  . BLADDER SUSPENSION  ~ 06/2007  . BREAST BIOPSY Left   . DIAGNOSTIC LAPAROSCOPY  1990's & ~ 2000   "I've had a couple; for endometrosis" (01/24/2013)  . HERNIA REPAIR    . INCISIONAL HERNIA REPAIR N/A 01/24/2013   Procedure: LAPAROSCOPIC INCISIONAL HERNIA;  Surgeon: Shelly Rubensteinouglas A Blackman, MD;  [facility];  Service: General;  Laterality: N/A;  . INSERTION OF MESH N/A 01/24/2013   Procedure: INSERTION OF MESH;  Surgeon: Shelly Rubensteinouglas A Blackman, MD;  Location: [facility] OR;  Service: General;  Laterality: N/A;  . LAPAROSCOPIC INCISIONAL / UMBILICAL / VENTRAL HERNIA REPAIR  01/24/2013   IHR w/mesh/notes  . NASAL SEPTUM SURGERY  1980's?  . OOPHORECTOMY Right 2009  . TONSILLECTOMY  1990's  . VAGINAL HYSTERECTOMY  ` 06/2007    Family History  Problem Relation Age of Onset  . Cancer Mother        lung  . Hypertension Mother   . Cancer Father        colon  . Hypertension Sister   . Cancer Sister   . Cancer Brother   . Hyperlipidemia Sister   . Birth defects Maternal Grandmother        breast  . Breast cancer Maternal Grandmother   . Birth defects Paternal Grandmother        uterine, stomach, lung  . Breast cancer Paternal Grandmother     Social History   Socioeconomic History  . Marital status: Married    Spouse name: Not on file  . Number of children: 2  . Years of education: Not on file  . Highest education level: Not on file  Occupational History  . 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  Social Needs   . Financial resource strain: Not on file  . Food insecurity    Worry: Not on file    Inability: Not on file  . Transportation needs    Medical: Not on file    Non-medical: Not on file  Tobacco Use  . Smoking status: Never Smoker  . Smokeless tobacco: Never Used  Substance and Sexual Activity  . Alcohol use: Yes    Alcohol/week:  0.0 standard drinks    Comment: 01/24/2013 "drink or 2 once or /twice/year, if that"  . Drug use: No  . Sexual activity: Not Currently  Lifestyle  . Physical activity    Days per week: Not on file    Minutes per session: Not on file  . Stress: Not on file  Relationships  . Social Herbalist on phone: Not on file    Gets together: Not on file    Attends religious service: Not on file    Active member of club or organization: Not on file    Attends meetings of clubs or organizations: Not on file    Relationship status: Not on file  . Intimate partner violence    Fear of current or ex partner: Not on file    Emotionally abused: Not on file    Physically abused: Not on file    Forced sexual activity: Not on file  Other Topics Concern  . Not on file  Social History Narrative   No living will   Would want husband as POA--then sister, Neoma Laming   Would accept resuscitation attempts   Probably would not want tube feeds if cognitively unaware   Review of Systems Appetite is so-so Trying to lose weight--has lost 10# Sleep is variable--some good nights Wears seat belt Teeth are okay--due for dentist No recent skin lesions--no recent derm visits Bowels are irregular--but no change. No blood No striking joint or back pain---hard to separate from RSD and fibromyalgia     Objective:   Physical Exam  Constitutional: She is oriented to person, place, and time. She appears well-developed. No distress.  HENT:  Mouth/Throat: Oropharynx is clear and moist. No oropharyngeal exudate.  Neck: No thyromegaly present.  Cardiovascular: Normal rate,  regular rhythm, normal heart sounds and intact distal pulses. Exam reveals no gallop.  No murmur heard. Respiratory: Effort normal and breath sounds normal. No respiratory distress. She has no wheezes. She has no rales.  GI: Soft. There is no abdominal tenderness.  Musculoskeletal:        General: No tenderness or edema.  Lymphadenopathy:    She has no cervical adenopathy.  Neurological: She is alert and oriented to person, place, and time.  President--- "Milinda Pointer, Obama, ?" [phone]- "I suck at math" D-[id number] Recall 3/3  Skin:  Fungal rash under right breast and in mid lower abdominal folds  Psychiatric: She has a normal mood and affect. Her behavior is normal.           Assessment & Plan:

## 2019-05-22 NOTE — Patient Instructions (Addendum)
Please set up your screening mammogram.   DASH Eating Plan DASH stands for "Dietary Approaches to Stop Hypertension." The DASH eating plan is a healthy eating plan that has been shown to reduce high blood pressure (hypertension). It may also reduce your risk for type 2 diabetes, heart disease, and stroke. The DASH eating plan may also help with weight loss. What are tips for following this plan?  General guidelines  Avoid eating more than 2,300 mg (milligrams) of salt (sodium) a day. If you have hypertension, you may need to reduce your sodium intake to 1,500 mg a day.  Limit alcohol intake to no more than 1 drink a day for nonpregnant women and 2 drinks a day for men. One drink equals 12 oz of beer, 5 oz of wine, or 1 oz of hard liquor.  Work with your health care provider to maintain a healthy body weight or to lose weight. Ask what an ideal weight is for you.  Get at least 30 minutes of exercise that causes your heart to beat faster (aerobic exercise) most days of the week. Activities may include walking, swimming, or biking.  Work with your health care provider or diet and nutrition specialist (dietitian) to adjust your eating plan to your individual calorie needs. Reading food labels   Check food labels for the amount of sodium per serving. Choose foods with less than 5 percent of the Daily Value of sodium. Generally, foods with less than 300 mg of sodium per serving fit into this eating plan.  To find whole grains, look for the word "whole" as the first word in the ingredient list. Shopping  Buy products labeled as "low-sodium" or "no salt added."  Buy fresh foods. Avoid canned foods and premade or frozen meals. Cooking  Avoid adding salt when cooking. Use salt-free seasonings or herbs instead of table salt or sea salt. Check with your health care provider or pharmacist before using salt substitutes.  Do not fry foods. Cook foods using healthy methods such as baking, boiling,  grilling, and broiling instead.  Cook with heart-healthy oils, such as olive, canola, soybean, or sunflower oil. Meal planning  Eat a balanced diet that includes: ? 5 or more servings of fruits and vegetables each day. At each meal, try to fill half of your plate with fruits and vegetables. ? Up to 6-8 servings of whole grains each day. ? Less than 6 oz of lean meat, poultry, or fish each day. A 3-oz serving of meat is about the same size as a deck of cards. One egg equals 1 oz. ? 2 servings of low-fat dairy each day. ? A serving of nuts, seeds, or beans 5 times each week. ? Heart-healthy fats. Healthy fats called Omega-3 fatty acids are found in foods such as flaxseeds and coldwater fish, like sardines, salmon, and mackerel.  Limit how much you eat of the following: ? Canned or prepackaged foods. ? Food that is high in trans fat, such as fried foods. ? Food that is high in saturated fat, such as fatty meat. ? Sweets, desserts, sugary drinks, and other foods with added sugar. ? Full-fat dairy products.  Do not salt foods before eating.  Try to eat at least 2 vegetarian meals each week.  Eat more home-cooked food and less restaurant, buffet, and fast food.  When eating at a restaurant, ask that your food be prepared with less salt or no salt, if possible. What foods are recommended? The items listed may not be   a complete list. Talk with your dietitian about what dietary choices are best for you. Grains Whole-grain or whole-wheat bread. Whole-grain or whole-wheat pasta. Brown rice. Oatmeal. Quinoa. Bulgur. Whole-grain and low-sodium cereals. Pita bread. Low-fat, low-sodium crackers. Whole-wheat flour tortillas. Vegetables Fresh or frozen vegetables (raw, steamed, roasted, or grilled). Low-sodium or reduced-sodium tomato and vegetable juice. Low-sodium or reduced-sodium tomato sauce and tomato paste. Low-sodium or reduced-sodium canned vegetables. Fruits All fresh, dried, or frozen  fruit. Canned fruit in natural juice (without added sugar). Meat and other protein foods Skinless chicken or turkey. Ground chicken or turkey. Pork with fat trimmed off. Fish and seafood. Egg whites. Dried beans, peas, or lentils. Unsalted nuts, nut butters, and seeds. Unsalted canned beans. Lean cuts of beef with fat trimmed off. Low-sodium, lean deli meat. Dairy Low-fat (1%) or fat-free (skim) milk. Fat-free, low-fat, or reduced-fat cheeses. Nonfat, low-sodium ricotta or cottage cheese. Low-fat or nonfat yogurt. Low-fat, low-sodium cheese. Fats and oils Soft margarine without trans fats. Vegetable oil. Low-fat, reduced-fat, or light mayonnaise and salad dressings (reduced-sodium). Canola, safflower, olive, soybean, and sunflower oils. Avocado. Seasoning and other foods Herbs. Spices. Seasoning mixes without salt. Unsalted popcorn and pretzels. Fat-free sweets. What foods are not recommended? The items listed may not be a complete list. Talk with your dietitian about what dietary choices are best for you. Grains Baked goods made with fat, such as croissants, muffins, or some breads. Dry pasta or rice meal packs. Vegetables Creamed or fried vegetables. Vegetables in a cheese sauce. Regular canned vegetables (not low-sodium or reduced-sodium). Regular canned tomato sauce and paste (not low-sodium or reduced-sodium). Regular tomato and vegetable juice (not low-sodium or reduced-sodium). Pickles. Olives. Fruits Canned fruit in a light or heavy syrup. Fried fruit. Fruit in cream or butter sauce. Meat and other protein foods Fatty cuts of meat. Ribs. Fried meat. Bacon. Sausage. Bologna and other processed lunch meats. Salami. Fatback. Hotdogs. Bratwurst. Salted nuts and seeds. Canned beans with added salt. Canned or smoked fish. Whole eggs or egg yolks. Chicken or turkey with skin. Dairy Whole or 2% milk, cream, and half-and-half. Whole or full-fat cream cheese. Whole-fat or sweetened yogurt. Full-fat  cheese. Nondairy creamers. Whipped toppings. Processed cheese and cheese spreads. Fats and oils Butter. Stick margarine. Lard. Shortening. Ghee. Bacon fat. Tropical oils, such as coconut, palm kernel, or palm oil. Seasoning and other foods Salted popcorn and pretzels. Onion salt, garlic salt, seasoned salt, table salt, and sea salt. Worcestershire sauce. Tartar sauce. Barbecue sauce. Teriyaki sauce. Soy sauce, including reduced-sodium. Steak sauce. Canned and packaged gravies. Fish sauce. Oyster sauce. Cocktail sauce. Horseradish that you find on the shelf. Ketchup. Mustard. Meat flavorings and tenderizers. Bouillon cubes. Hot sauce and Tabasco sauce. Premade or packaged marinades. Premade or packaged taco seasonings. Relishes. Regular salad dressings. Where to find more information:  National Heart, Lung, and Blood Institute: www.nhlbi.nih.gov  American Heart Association: www.heart.org Summary  The DASH eating plan is a healthy eating plan that has been shown to reduce high blood pressure (hypertension). It may also reduce your risk for type 2 diabetes, heart disease, and stroke.  With the DASH eating plan, you should limit salt (sodium) intake to 2,300 mg a day. If you have hypertension, you may need to reduce your sodium intake to 1,500 mg a day.  When on the DASH eating plan, aim to eat more fresh fruits and vegetables, whole grains, lean proteins, low-fat dairy, and heart-healthy fats.  Work with your health care provider or diet and nutrition specialist (  dietitian) to adjust your eating plan to your individual calorie needs. This information is not intended to replace advice given to you by your health care provider. Make sure you discuss any questions you have with your health care provider. Document Released: 09/23/2011 Document Revised: 09/16/2017 Document Reviewed: 09/27/2016 Elsevier Patient Education  2020 Elsevier Inc.  

## 2019-05-22 NOTE — Assessment & Plan Note (Signed)
Has lost weight DASH info

## 2019-05-22 NOTE — Assessment & Plan Note (Signed)
I have personally reviewed the Medicare Annual Wellness questionnaire and have noted 1. The patient's medical and social history 2. Their use of alcohol, tobacco or illicit drugs 3. Their current medications and supplements 4. The patient's functional ability including ADL's, fall risks, home safety risks and hearing or visual             impairment. 5. Diet and physical activities 6. Evidence for depression or mood disorders  The patients weight, height, BMI and visual acuity have been recorded in the chart I have made referrals, counseling and provided education to the patient based review of the above and I have provided the pt with a written personalized care plan for preventive services.  I have provided you with a copy of your personalized plan for preventive services. Please take the time to review along with your updated medication list.  Discussed trying to do some exercise Flu vaccine in the fall Due for mammogram--she will set up No pap due to hyster Colon due next year

## 2019-05-22 NOTE — Assessment & Plan Note (Signed)
BP Readings from Last 3 Encounters:  05/22/19 118/84  04/26/19 129/82  12/27/18 114/77   Has been fine on ARB

## 2019-05-22 NOTE — Assessment & Plan Note (Signed)
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 Not clear that more medication is indicated

## 2019-05-22 NOTE — Assessment & Plan Note (Signed)
Okay with PPI 

## 2019-05-22 NOTE — Progress Notes (Signed)
Hearing Screening   Method: Audiometry   125Hz  250Hz  500Hz  1000Hz  2000Hz  3000Hz  4000Hz  6000Hz  8000Hz   Right ear:   0 0 20  20    Left ear:   0 0 25  25      Visual Acuity Screening   Right eye Left eye Both eyes  Without correction: 20/30 20/25 20/25   With correction:

## 2019-05-22 NOTE — Assessment & Plan Note (Signed)
Continues on chronic pain regimen

## 2019-05-23 ENCOUNTER — Telehealth: Payer: Self-pay

## 2019-05-23 DIAGNOSIS — N644 Mastodynia: Secondary | ICD-10-CM

## 2019-05-23 NOTE — Telephone Encounter (Signed)
Spoke to pt and verified which breast she was having issues with. She stated the left. So, I ordered a bilateral diagnostic mammogram and a left side breast u/s.

## 2019-05-23 NOTE — Telephone Encounter (Signed)
Please put in the correct orders---I absolutely NEVER get that right

## 2019-05-23 NOTE — Telephone Encounter (Signed)
Pt left v/m that saw Dr Silvio Pate for annual and when pt called to schedule mammogram pt told them that she has a pain in her breast; pt did not tell Dr Silvio Pate about the pain in her breast; pt was advised to call [facility] for order to be sent for diagnostic mammogram. Pt request cb.

## 2019-05-24 ENCOUNTER — Other Ambulatory Visit: Payer: Self-pay

## 2019-05-24 ENCOUNTER — Encounter: Payer: Worker's Compensation | Attending: Physical Medicine & Rehabilitation | Admitting: Registered Nurse

## 2019-05-24 ENCOUNTER — Encounter: Payer: Self-pay | Admitting: Registered Nurse

## 2019-05-24 VITALS — BP 120/79 | HR 74 | Temp 97.5°F | Resp 16 | Ht 67.0 in | Wt 167.4 lb

## 2019-05-24 DIAGNOSIS — G4700 Insomnia, unspecified: Secondary | ICD-10-CM | POA: Insufficient documentation

## 2019-05-24 DIAGNOSIS — G894 Chronic pain syndrome: Secondary | ICD-10-CM | POA: Diagnosis not present

## 2019-05-24 DIAGNOSIS — Z5181 Encounter for therapeutic drug level monitoring: Secondary | ICD-10-CM | POA: Diagnosis present

## 2019-05-24 DIAGNOSIS — Z79899 Other long term (current) drug therapy: Secondary | ICD-10-CM | POA: Diagnosis present

## 2019-05-24 DIAGNOSIS — G90512 Complex regional pain syndrome I of left upper limb: Secondary | ICD-10-CM | POA: Insufficient documentation

## 2019-05-24 DIAGNOSIS — M62838 Other muscle spasm: Secondary | ICD-10-CM | POA: Diagnosis present

## 2019-05-24 DIAGNOSIS — F3289 Other specified depressive episodes: Secondary | ICD-10-CM | POA: Diagnosis present

## 2019-05-24 MED ORDER — TOPIRAMATE 100 MG PO TABS: ORAL_TABLET | ORAL | 3 refills | Status: DC

## 2019-05-24 MED ORDER — TRAMADOL HCL ER 100 MG PO TB24: 100.0000 mg | ORAL_TABLET | Freq: Every day | ORAL | 2 refills | Status: DC

## 2019-05-24 MED ORDER — HYDROCODONE-ACETAMINOPHEN 7.5-325 MG PO TABS: ORAL_TABLET | ORAL | 0 refills | Status: DC

## 2019-05-24 MED ORDER — TRAMADOL HCL 50 MG PO TABS: ORAL_TABLET | ORAL | 2 refills | Status: DC

## 2019-05-24 NOTE — Progress Notes (Signed)
Subjective:    Patient ID: [name], female    DOB: [birth year], 53 y.o.   MRN: [id number]  HPI: [name] is a 53 y.o. female who returns for follow up appointment for chronic pain and medication refill. She states her pain is located in her left arm and left hand. She rates her pain 8. Her current exercise regime is walking short distances.   [name] Morphine equivalent is 35.00  MME.  Last Oral Swab was Performed on 04/26/2019, it was consistent.   Pain Inventory Average Pain 8 Pain Right Now 8 My pain is constant, sharp, burning, dull, stabbing, tingling and aching  In the last 24 hours, has pain interfered with the following? General activity 6 Relation with others 6 Enjoyment of life 6 What TIME of day is your pain at its worst? all Sleep (in general) Poor  Pain is worse with: walking, bending, sitting, standing and some activites Pain improves with: rest, heat/ice, pacing activities, medication and TENS Relief from Meds: 6  Mobility walk without assistance ability to climb steps?  yes do you drive?  yes  Function disabled: date disabled 57  Neuro/Psych bladder control problems weakness numbness tremor tingling trouble walking spasms dizziness depression anxiety  Prior Studies Any changes since last visit?  no  Physicians involved in your care Any changes since last visit?  no   Family History  Problem Relation Age of Onset  . Cancer Mother        lung  . Hypertension Mother   . Cancer Father        colon  . Hypertension Sister   . Cancer Sister   . Cancer Brother   . Hyperlipidemia Sister   . Birth defects Maternal Grandmother        breast  . Breast cancer Maternal Grandmother   . Birth defects Paternal Grandmother        uterine, stomach, lung  . Breast cancer Paternal Grandmother    Social History   Socioeconomic History  . Marital status: Married    Spouse name: Not on file  . Number of children:  2  . Years of education: Not on file  . Highest education level: Not on file  Occupational History  . 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 Financial resource strain: Not on file  . Food insecurity    Worry: Not on file    Inability: Not on file  . Transportation needs    Medical: Not on file    Non-medical: Not on file  Tobacco Use  . Smoking status: Never Smoker  . Smokeless tobacco: Never Used  Substance and Sexual Activity  . Alcohol use: Yes    Alcohol/week: 0.0 standard drinks    Comment: 01/24/2013 "drink or 2 once or /twice/year, if that"  . Drug use: No  . Sexual activity: Not Currently  Lifestyle  . Physical activity    Days per week: Not on file    Minutes per session: Not on file  . Stress: Not on file  Relationships  . Social Herbalist on phone: Not on file    Gets together: Not on file    Attends religious service: Not on file    Active member of club or organization: Not on file    Attends meetings of clubs or organizations: Not on file    Relationship status: Not on file  Other Topics Concern  . Not on file  Social  History Narrative   No living will   Would want husband as POA--then sister, Neoma Laming   Would accept resuscitation attempts   Probably would not want tube feeds if cognitively unaware   Past Surgical History:  Procedure Laterality Date  . APPENDECTOMY  ~ 06/2007  . BLADDER REPAIR  ~ 06/2007   "same day after bladder lift" (01/24/2013)  . BLADDER SUSPENSION  ~ 06/2007  . BREAST BIOPSY Left   . DIAGNOSTIC LAPAROSCOPY  1990's & ~ 2000   "I've had a couple; for endometrosis" (01/24/2013)  . HERNIA REPAIR    . INCISIONAL HERNIA REPAIR N/A 01/24/2013   Procedure: LAPAROSCOPIC INCISIONAL HERNIA;  Surgeon: Harl Bowie, MD;  Location: [facility];  Service: General;  Laterality: N/A;  . INSERTION OF MESH N/A 01/24/2013   Procedure: INSERTION OF MESH;  Surgeon: Harl Bowie, MD;  Location: [facility];  Service: General;  Laterality:  N/A;  . LAPAROSCOPIC INCISIONAL / UMBILICAL / [place]  01/24/2013   IHR w/mesh/notes  . NASAL SEPTUM SURGERY  1980's?  . OOPHORECTOMY Right 2009  . TONSILLECTOMY  1990's  . VAGINAL HYSTERECTOMY  ` 06/2007   Past Medical History:  Diagnosis Date  . Allergy   . Anxiety   . Arthritis    "just the norm" (01/24/2013)  . ASCVD (arteriosclerotic cardiovascular disease)   . Asthma   . Celiac disease   . Daily headache    "depends on the season" (01/24/2013)  . Fibromyalgia   . GERD (gastroesophageal reflux disease)   . History of colonic polyps   . Hyperlipidemia   . Hypertension   . Migraines   . Obesity (BMI 30.0-34.9)   . RSD (reflex sympathetic dystrophy)   . Sleep apnea    NO MACHINE RECOMMENDED  . TIA (transient ischemic attack) ~ 2009   There were no vitals taken for this visit.  Opioid Risk Score:   Fall Risk Score:  `1  Depression screen PHQ 2/9  Depression screen [facility] 2/9 02/26/2019 11/29/2018 09/07/2018 05/09/2018 02/06/2018 01/09/2018 10/04/2017  Decreased Interest 1 1 1 1 1 1 1   Down, Depressed, Hopeless 1 1 1 1 1 1 1   PHQ - 2 Score 2 2 2 2 2 2 2   Altered sleeping - - - - 0 - -  Tired, decreased energy - - - - 1 - -  Change in appetite - - - - 0 - -  Feeling bad or failure about yourself  - - - - 0 - -  Trouble concentrating - - - - 0 - -  Moving slowly or fidgety/restless - - - - 0 - -  Suicidal thoughts - - - - 0 - -  PHQ-9 Score - - - - 3 - -  Difficult doing work/chores - - - - Not difficult at all - -  Some recent data might be hidden     Review of Systems  Constitutional: Negative.   HENT: Negative.   Eyes: Negative.   Respiratory: Negative.   Cardiovascular: Negative.   Gastrointestinal: Negative.   Endocrine: Negative.   Genitourinary: Positive for difficulty urinating.  Musculoskeletal: Positive for myalgias.  Skin: Negative.   Allergic/Immunologic: Negative.   Neurological: Positive for dizziness, weakness and numbness.   Psychiatric/Behavioral: Positive for dysphoric mood. The patient is nervous/anxious.        Objective:   Physical Exam Vitals signs and nursing note reviewed.  Constitutional:      Appearance: Normal appearance.  Neck:  Musculoskeletal: Normal range of motion and neck supple.  Cardiovascular:     Rate and Rhythm: Normal rate and regular rhythm.     Pulses: Normal pulses.     Heart sounds: Normal heart sounds.  Pulmonary:     Effort: Pulmonary effort is normal.     Breath sounds: Normal breath sounds.  Musculoskeletal:     Comments: Normal Muscle Bulk and Muscle Testing Reveals:  Upper Extremities: Right: Full ROM and Muscle Strength 5/5 Left: Decreased ROM 90 Degrees and Muscle Strength 3/5 Bilateral AC Joint Tenderness  Thoracic Paraspinal Tenderness: T-7-T-9  Lumbar Paraspinal Tenderness: L-4-L-5 Lower Extremities: Full ROM and Muscle Strength 4/5 Bilateral Lower Extremities Flexion Produces Pain into Bilateral Lower Extremities Arises from Table Slowly Antalgic Gait   Skin:    General: Skin is warm and dry.  Neurological:     Mental Status: She is alert and oriented to person, place, and time.  Psychiatric:        Mood and Affect: Mood normal.        Behavior: Behavior normal.           Assessment & Plan:  1.Complex Regional Pain Syndrome Type 1: Continue Topamax and Cymbalta.05/24/2019. Refilled:Hydro-codone 7.5/325mg one tablet twice a day one in the morning and one at bedtime#60. Continue:Tramadol 100 mg ER daily #30 and Tramadol 50 mg one tablet by mouth BID. One tablet in the afternoon and one tablet in the evening. We will continue the opioid monitoring program, this consists of regular clinic visits, examinations, urine drug screen, pill counts as well as use of [place] Controlled Substance Reporting System. 2. Depression: Continuecurrent medication regimen withCymbalta.05/24/2019 3. Insomnia: Continuecurrent medication regimen  withTrazodone50 mg HS. 05/24/2019 4. Muscle Spasms: Continuecurrent medication regimen withTizanidine.05/24/2019 5. Chronic Pain Syndrome: Continue IbuprofenBID as needed.05/24/2019  F/U in 1 month   15  minutes of face to face patient care time was spent during this visit. All questions were encouraged and answered.

## 2019-06-01 ENCOUNTER — Other Ambulatory Visit: Payer: Self-pay

## 2019-06-01 ENCOUNTER — Ambulatory Visit
Admission: RE | Admit: 2019-06-01 | Discharge: 2019-06-01 | Disposition: A | Discharge status: Home / Self Care | Payer: Medicare HMO | Source: Ambulatory Visit | Attending: Internal Medicine | Admitting: Internal Medicine

## 2019-06-01 DIAGNOSIS — R928 Other abnormal and inconclusive findings on diagnostic imaging of breast: Secondary | ICD-10-CM | POA: Diagnosis not present

## 2019-06-01 DIAGNOSIS — N644 Mastodynia: Secondary | ICD-10-CM

## 2019-06-22 ENCOUNTER — Other Ambulatory Visit: Payer: Self-pay

## 2019-06-22 ENCOUNTER — Encounter: Payer: Worker's Compensation | Attending: Physical Medicine & Rehabilitation | Admitting: Registered Nurse

## 2019-06-22 ENCOUNTER — Encounter: Payer: Self-pay | Admitting: Registered Nurse

## 2019-06-22 VITALS — BP 128/84 | HR 79 | Temp 98.5°F | Ht 67.0 in | Wt 164.8 lb

## 2019-06-22 DIAGNOSIS — Z79899 Other long term (current) drug therapy: Secondary | ICD-10-CM | POA: Insufficient documentation

## 2019-06-22 DIAGNOSIS — G894 Chronic pain syndrome: Secondary | ICD-10-CM | POA: Insufficient documentation

## 2019-06-22 DIAGNOSIS — F3289 Other specified depressive episodes: Secondary | ICD-10-CM | POA: Diagnosis present

## 2019-06-22 DIAGNOSIS — G90512 Complex regional pain syndrome I of left upper limb: Secondary | ICD-10-CM | POA: Diagnosis present

## 2019-06-22 DIAGNOSIS — Z5181 Encounter for therapeutic drug level monitoring: Secondary | ICD-10-CM | POA: Diagnosis not present

## 2019-06-22 DIAGNOSIS — M62838 Other muscle spasm: Secondary | ICD-10-CM | POA: Insufficient documentation

## 2019-06-22 DIAGNOSIS — G4700 Insomnia, unspecified: Secondary | ICD-10-CM | POA: Insufficient documentation

## 2019-06-22 MED ORDER — HYDROCODONE-ACETAMINOPHEN 7.5-325 MG PO TABS: ORAL_TABLET | ORAL | 0 refills | Status: DC

## 2019-06-22 NOTE — Progress Notes (Signed)
Subjective:    Patient ID: [name], female    DOB: [birth year], 53 y.o.   MRN: [id number]  HPI: [name] is a 53 y.o. female who returns for follow up appointment for chronic pain and medication refill. She states her pain is located in her left arm. She  rates her pain 7. His current exercise regime is walking.   [name] Morphine equivalent is 35.00 MME.  Last UDS was Performed on 04/26/2019, it was consistent.   Pain Inventory Average Pain 7 Pain Right Now 7 My pain is n/a  In the last 24 hours, has pain interfered with the following? General activity 7 Relation with others 7 Enjoyment of life 7 What TIME of day is your pain at its worst? all day Sleep (in general) Poor  Pain is worse with: walking, bending, sitting and standing Pain improves with: rest, heat/ice, therapy/exercise, pacing activities, medication and TENS Relief from Meds: 7  Mobility ability to climb steps?  yes do you drive?  yes  Function disabled: date disabled . I need assistance with the following:  meal prep, household duties and shopping  Neuro/Psych bladder control problems weakness numbness tremor tingling trouble walking spasms dizziness confusion depression  Prior Studies Any changes since last visit?  no  Physicians involved in your care Any changes since last visit?  no   Family History  Problem Relation Age of Onset  . Cancer Mother        lung  . Hypertension Mother   . Cancer Father        colon  . Hypertension Sister   . Cancer Sister   . Cancer Brother   . Hyperlipidemia Sister   . Birth defects Maternal Grandmother        breast  . Breast cancer Maternal Grandmother   . Birth defects Paternal Grandmother        uterine, stomach, lung  . Breast cancer Paternal Grandmother    Social History   Socioeconomic History  . Marital status: Married    Spouse name: Not on file  . Number of children: 2  . Years of education: Not on  file  . Highest education level: Not on file  Occupational History  . 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 Financial resource strain: Not on file  . Food insecurity    Worry: Not on file    Inability: Not on file  . Transportation needs    Medical: Not on file    Non-medical: Not on file  Tobacco Use  . Smoking status: Never Smoker  . Smokeless tobacco: Never Used  Substance and Sexual Activity  . Alcohol use: Yes    Alcohol/week: 0.0 standard drinks    Comment: 01/24/2013 "drink or 2 once or /twice/year, if that"  . Drug use: No  . Sexual activity: Not Currently  Lifestyle  . Physical activity    Days per week: Not on file    Minutes per session: Not on file  . Stress: Not on file  Relationships  . Social Herbalist on phone: Not on file    Gets together: Not on file    Attends religious service: Not on file    Active member of club or organization: Not on file    Attends meetings of clubs or organizations: Not on file    Relationship status: Not on file  Other Topics Concern  . Not on file  Social History Narrative  No living will   Would want husband as POA--then sister, Neoma Laming   Would accept resuscitation attempts   Probably would not want tube feeds if cognitively unaware   Past Surgical History:  Procedure Laterality Date  . APPENDECTOMY  ~ 06/2007  . BLADDER REPAIR  ~ 06/2007   "same day after bladder lift" (01/24/2013)  . BLADDER SUSPENSION  ~ 06/2007  . BREAST BIOPSY Left   . DIAGNOSTIC LAPAROSCOPY  1990's & ~ 2000   "I've had a couple; for endometrosis" (01/24/2013)  . HERNIA REPAIR    . INCISIONAL HERNIA REPAIR N/A 01/24/2013   Procedure: LAPAROSCOPIC INCISIONAL HERNIA;  Surgeon: Harl Bowie, MD;  Location: [facility];  Service: General;  Laterality: N/A;  . INSERTION OF MESH N/A 01/24/2013   Procedure: INSERTION OF MESH;  Surgeon: Harl Bowie, MD;  Location: [facility];  Service: General;  Laterality: N/A;  . LAPAROSCOPIC INCISIONAL /  UMBILICAL / [place]  01/24/2013   IHR w/mesh/notes  . NASAL SEPTUM SURGERY  1980's?  . OOPHORECTOMY Right 2009  . TONSILLECTOMY  1990's  . VAGINAL HYSTERECTOMY  ` 06/2007   Past Medical History:  Diagnosis Date  . Allergy   . Anxiety   . Arthritis    "just the norm" (01/24/2013)  . ASCVD (arteriosclerotic cardiovascular disease)   . Asthma   . Celiac disease   . Daily headache    "depends on the season" (01/24/2013)  . Fibromyalgia   . GERD (gastroesophageal reflux disease)   . History of colonic polyps   . Hyperlipidemia   . Hypertension   . Migraines   . Obesity (BMI 30.0-34.9)   . RSD (reflex sympathetic dystrophy)   . Sleep apnea    NO MACHINE RECOMMENDED  . TIA (transient ischemic attack) ~ 2009   BP 128/84   Pulse 79   Temp 98.5 F (36.9 C)   Ht 5' 7"  (1.702 m)   Wt 164 lb 12.8 oz (74.8 kg)   SpO2 94%   BMI 25.81 kg/m   Opioid Risk Score:   Fall Risk Score:  `1  Depression screen PHQ 2/9  Depression screen [facility] 2/9 02/26/2019 11/29/2018 09/07/2018 05/09/2018 02/06/2018 01/09/2018 10/04/2017  Decreased Interest 1 1 1 1 1 1 1   Down, Depressed, Hopeless 1 1 1 1 1 1 1   PHQ - 2 Score 2 2 2 2 2 2 2   Altered sleeping - - - - 0 - -  Tired, decreased energy - - - - 1 - -  Change in appetite - - - - 0 - -  Feeling bad or failure about yourself  - - - - 0 - -  Trouble concentrating - - - - 0 - -  Moving slowly or fidgety/restless - - - - 0 - -  Suicidal thoughts - - - - 0 - -  PHQ-9 Score - - - - 3 - -  Difficult doing work/chores - - - - Not difficult at all - -  Some recent data might be hidden     Review of Systems  Constitutional: Negative.   HENT: Negative.   Eyes: Negative.   Respiratory: Negative.   Cardiovascular: Negative.   Gastrointestinal: Negative.   Endocrine: Negative.   Genitourinary: Negative.   Musculoskeletal: Positive for gait problem.  Skin: Negative.   Allergic/Immunologic: Negative.   Neurological: Positive for dizziness,  tremors, weakness and numbness.  Hematological: Negative.   Psychiatric/Behavioral: Positive for dysphoric mood. The patient is nervous/anxious.  All other systems reviewed and are negative.      Objective:   Physical Exam Vitals signs and nursing note reviewed.  Constitutional:      Appearance: Normal appearance.  Neck:     Musculoskeletal: Normal range of motion and neck supple.  Cardiovascular:     Rate and Rhythm: Normal rate and regular rhythm.     Pulses: Normal pulses.     Heart sounds: Normal heart sounds.  Pulmonary:     Effort: Pulmonary effort is normal.     Breath sounds: Normal breath sounds.  Musculoskeletal:     Comments: Normal Muscle Bulk and Muscle Testing Reveals: Upper Extremities: Right: Full ROM and Muscle Strength 5/5 Left: Decreased ROM 90 Degrees and Muscle Strength 3/5 Thoracic Hypersensitivity Lower Extremities: Full ROM and Muscle Strrength 5/5 Bilateral Lower Extremities Flexion Produces Pain into Bilateral Lower Extremities and Bilateral Feet  Arises from Table Slowly Narrow BasedGait   Skin:    General: Skin is warm and dry.  Neurological:     Mental Status: She is alert and oriented to person, place, and time.  Psychiatric:        Mood and Affect: Mood normal.        Behavior: Behavior normal.           Assessment and Plan:  1.Complex Regional Pain Syndrome Type 1: Continue Topamax and Cymbalta.06/22/2019. Refilled:Hydro-codone 7.5/325mg one tablet twice a day one in the morning and one at bedtime#60. Continue:Tramadol 100 mg ER daily #30 and Tramadol 50 mg one tablet by mouth BID. One tablet in the afternoon and one tablet in the evening. We will continue the opioid monitoring program, this consists of regular clinic visits, examinations, urine drug screen, pill counts as well as use of [place] Controlled Substance Reporting System. 2. Depression: Continuecurrent medication regimen withCymbalta.06/22/2019 3. Insomnia:  Continuecurrent medication regimen withTrazodone50 mg HS. 06/22/2019 4. Muscle Spasms: Continuecurrent medication regimen withTizanidine.06/22/2019 5. Chronic Pain Syndrome: Continue IbuprofenBID as needed.06/22/2019  F/U in 1 month   14mnutes of face to face patient care time was spent during this visit. All questions were encouraged and answered.

## 2019-06-26 ENCOUNTER — Other Ambulatory Visit: Payer: Self-pay | Admitting: Physical Medicine & Rehabilitation

## 2019-07-23 ENCOUNTER — Ambulatory Visit: Payer: Medicare HMO | Admitting: Registered Nurse

## 2019-07-23 ENCOUNTER — Other Ambulatory Visit: Payer: Self-pay

## 2019-07-23 ENCOUNTER — Encounter: Payer: Self-pay | Admitting: Physical Medicine & Rehabilitation

## 2019-07-23 ENCOUNTER — Encounter
Payer: Worker's Compensation | Attending: Physical Medicine & Rehabilitation | Admitting: Physical Medicine & Rehabilitation

## 2019-07-23 VITALS — BP 130/86 | HR 85 | Temp 98.1°F | Ht 67.0 in | Wt 168.0 lb

## 2019-07-23 DIAGNOSIS — M62838 Other muscle spasm: Secondary | ICD-10-CM | POA: Diagnosis present

## 2019-07-23 DIAGNOSIS — G894 Chronic pain syndrome: Secondary | ICD-10-CM | POA: Insufficient documentation

## 2019-07-23 DIAGNOSIS — Z5181 Encounter for therapeutic drug level monitoring: Secondary | ICD-10-CM | POA: Insufficient documentation

## 2019-07-23 DIAGNOSIS — F3289 Other specified depressive episodes: Secondary | ICD-10-CM | POA: Diagnosis present

## 2019-07-23 DIAGNOSIS — G90512 Complex regional pain syndrome I of left upper limb: Secondary | ICD-10-CM | POA: Diagnosis present

## 2019-07-23 DIAGNOSIS — Z79899 Other long term (current) drug therapy: Secondary | ICD-10-CM | POA: Insufficient documentation

## 2019-07-23 DIAGNOSIS — G4700 Insomnia, unspecified: Secondary | ICD-10-CM | POA: Diagnosis present

## 2019-07-23 MED ORDER — HYDROCODONE-ACETAMINOPHEN 7.5-325 MG PO TABS: ORAL_TABLET | ORAL | 0 refills | Status: DC

## 2019-07-23 NOTE — Progress Notes (Signed)
Subjective:    Patient ID: [name], female    DOB: Mar 12, 1966, 53 y.o.   MRN: [id number] 53 year old female with history of right wrist sprain in the 1990s.  This was a work related injury she underwent pain medicine evaluation in [place] and stellate ganglion blocks were tried with temporary relief of symptoms.  She was started on morphine and worked up to doses of over 100 mg/day.  She relocated to [facility] approximately 14 years ago Her pain medications were adjusted and was started on Cymbalta and lidocaine patch while her morphine dose was weaned over time and was discontinued and the patient was maintained on hydrocodone 5 mg at night tramadol extended release 100 mg/day and an additional 50 mg of tramadol at night. She has been on Topamax 100 mg twice daily for migraine prophylaxis started by a neurologist. She is also has been on Zanaflex 4 mg 3 times daily for more than 10 years at one point she was on ibuprofen 800 3 times daily but has been weaned down to 600 mg twice daily as needed Patient's pain has started increasing once a day again.  She does not wish to go back on higher dose narcotic analgesic type medications.  Her hydrocodone dose was increased to 7.5 mill grams twice daily HPI  Left Wrist and UE CRPS 1  Pt on Tramadol 169m ER daily and Tramadol IR 5omg BID scheduled for breakthrough pain at lunchtime and early evening to coincide with times of increased activity level.  Pt needs to cook meals at tat time for herself and her family Patient takes Hydrocodone 7.5 mg twice a day in the morning and at bedtime.  THe pt is able to sleep after taking this medication.  THe patient complains of increased pain first thing in the morning and has difficulty starting her morning activities. The patient's current pain regimen results in a 50% relief of pain Trazodone 50 mg nightly for sleep Tramadol ER 100 mg daily Tramadol immediate release 50 mg twice  daily Topiramate 100 mg every morning 200 mg nightly Tizanidine 4 mg prescribed 4 times a day patient states she is actually taking it twice a day for muscle spasms in the left trapezius and left arm Duloxetine 90 mg/day Hydrocodone 7.5 mg twice daily PADT form was completed as requested and faxed to the patient's attorney. I also reviewed the IME supplied by Dr. WKarren CobbleRecommendations were for a wean of hydrocodone as well as short acting tramadol and to continue long-acting tramadol.  Also recommended discontinue of ibuprofen.  At this point the patient is not using the worker comp system to pay for the ibuprofen she has taken on a as needed basis. Also recommendations to taper Topamax.  Patient has tried stopping this in the past and her neuropathic pain has increased. The tizanidine recommendation was to wean. The opinion on Cymbalta was to continue  The patient does complain of chronically high stress levels.  She feels like the fight against Worker's Comp. efforts to limit her medication funding is contributing. Pain Inventory Average Pain 8 Pain Right Now 8 My pain is constant, sharp, burning, dull, stabbing, tingling and aching  In the last 24 hours, has pain interfered with the following? General activity 4 Relation with others 4 Enjoyment of life 4 What TIME of day is your pain at its worst? all Sleep (in general) Poor  Pain is worse with: walking, bending, sitting, standing and some activites Pain improves with: rest, heat/ice,  pacing activities, medication, TENS and injections Relief from Meds: 7  Mobility walk without assistance ability to climb steps?  yes do you drive?  yes  Function I need assistance with the following:  meal prep, household duties and shopping  Neuro/Psych bladder control problems weakness numbness tremor tingling trouble walking spasms dizziness depression anxiety  Prior Studies Any changes since last visit?  no  Physicians  involved in your care Any changes since last visit?  no   Family History  Problem Relation Age of Onset   Cancer Mother        lung   Hypertension Mother    Cancer Father        colon   Hypertension Sister    Cancer Sister    Cancer Brother    Hyperlipidemia Sister    Birth defects Maternal Grandmother        breast   Breast cancer Maternal Grandmother    Birth defects Paternal Grandmother        uterine, stomach, lung   Breast cancer Paternal Grandmother    Social History   Socioeconomic History   Marital status: Married    Spouse name: Not on file   Number of children: 2   Years of education: Not on file   Highest education level: Not on file  Occupational History   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  Social Needs   Financial resource strain: Not on file   Food insecurity    Worry: Not on file    Inability: Not on file   Transportation needs    Medical: Not on file    Non-medical: Not on file  Tobacco Use   Smoking status: Never Smoker   Smokeless tobacco: Never Used  Substance and Sexual Activity   Alcohol use: Yes    Alcohol/week: 0.0 standard drinks    Comment: 01/24/2013 "drink or 2 once or /twice/year, if that"   Drug use: No   Sexual activity: Not Currently  Lifestyle   Physical activity    Days per week: Not on file    Minutes per session: Not on file   Stress: Not on file  Relationships   Social connections    Talks on phone: Not on file    Gets together: Not on file    Attends religious service: Not on file    Active member of club or organization: Not on file    Attends meetings of clubs or organizations: Not on file    Relationship status: Not on file  Other Topics Concern   Not on file  Social History Narrative   No living will   Would want husband as POA--then sister, Neoma Laming   Would accept resuscitation attempts   Probably would not want tube feeds if cognitively unaware   Past Surgical History:  Procedure  Laterality Date   APPENDECTOMY  ~ 06/2007   BLADDER REPAIR  ~ 06/2007   "same day after bladder lift" (01/24/2013)   BLADDER SUSPENSION  ~ 06/2007   BREAST BIOPSY Left    DIAGNOSTIC LAPAROSCOPY  1990's & ~ 2000   "I've had a couple; for endometrosis" (01/24/2013)   [place] N/A 01/24/2013   Procedure: LAPAROSCOPIC INCISIONAL HERNIA;  Surgeon: Harl Bowie, MD;  Location: [place];  Service: General;  Laterality: N/A;   INSERTION OF MESH N/A 01/24/2013   Procedure: INSERTION OF MESH;  Surgeon: Harl Bowie, MD;  Location: [place];  Service: General;  Laterality: N/A;   LAPAROSCOPIC INCISIONAL / UMBILICAL / VENTRAL HERNIA REPAIR  01/24/2013   IHR w/mesh/notes   NASAL SEPTUM SURGERY  1980's?   OOPHORECTOMY Right 2009   TONSILLECTOMY  1990's   VAGINAL HYSTERECTOMY  ` 06/2007   Past Medical History:  Diagnosis Date   Allergy    Anxiety    Arthritis    "just the norm" (01/24/2013)   ASCVD (arteriosclerotic cardiovascular disease)    Asthma    Celiac disease    Daily headache    "depends on the season" (01/24/2013)   Fibromyalgia    GERD (gastroesophageal reflux disease)    History of colonic polyps    Hyperlipidemia    Hypertension    Migraines    Obesity (BMI 30.0-34.9)    RSD (reflex sympathetic dystrophy)    Sleep apnea    NO MACHINE RECOMMENDED   TIA (transient ischemic attack) ~ 2009   BP 130/86    Pulse 85    Temp 98.1 F (36.7 C)    Ht 5' 7"  (1.702 m)    Wt 168 lb (76.2 kg)    SpO2 95%    BMI 26.31 kg/m   Opioid Risk Score:   Fall Risk Score:  `1  Depression screen PHQ 2/9  Depression screen [facility] 2/9 02/26/2019 11/29/2018 09/07/2018 05/09/2018 02/06/2018 01/09/2018 10/04/2017  Decreased Interest 1 1 1 1 1 1 1   Down, Depressed, Hopeless 1 1 1 1 1 1 1   PHQ - 2 Score 2 2 2 2 2 2 2   Altered sleeping - - - - 0 - -  Tired, decreased energy - - - - 1 - -  Change in appetite - - - - 0 - -  Feeling bad or failure about  yourself  - - - - 0 - -  Trouble concentrating - - - - 0 - -  Moving slowly or fidgety/restless - - - - 0 - -  Suicidal thoughts - - - - 0 - -  PHQ-9 Score - - - - 3 - -  Difficult doing work/chores - - - - Not difficult at all - -  Some recent data might be hidden    Review of Systems  Constitutional: Negative.   HENT: Negative.   Eyes: Negative.   Respiratory: Negative.   Cardiovascular: Negative.   Gastrointestinal: Negative.   Endocrine: Negative.   Genitourinary: Negative.   Musculoskeletal: Positive for gait problem.       Spasms   Skin: Negative.   Allergic/Immunologic: Negative.   Neurological: Positive for dizziness, tremors, weakness and numbness.       Tingling   Hematological: Negative.   Psychiatric/Behavioral: Positive for dysphoric mood. The patient is nervous/anxious.   All other systems reviewed and are negative.      Objective:   Physical Exam Constitutional:      Appearance: Normal appearance.  HENT:     Head: Normocephalic and atraumatic.  Eyes:     Extraocular Movements: Extraocular movements intact.     Conjunctiva/sclera: Conjunctivae normal.     Pupils: Pupils are equal, round, and reactive to light.  Neck:     Musculoskeletal: Normal range of motion.  Pulmonary:     Effort: Pulmonary effort is normal. No respiratory distress.  Abdominal:     General: There is no distension.  Neurological:     Mental Status: She is alert and oriented to person, place, and time.  Psychiatric:        Mood and Affect:  Mood is anxious.        Speech: Speech normal.        Behavior: Behavior normal.        Thought Content: Thought content normal.        Cognition and Memory: Cognition normal.        Judgment: Judgment normal.     Motor strength is 4/5 in the left deltoid bicep tricep grip there is hypersensitivity to touch over the left wrist and hand there is no evidence of erythema or cyanosis.  There is hyperhidrosis of the left hand. Right extremity  is 5/5 strength no pain or signs sensitivity. Right lower extremity 5 or 5 strength mild hypersensitivity Left lower extremity 4/5 strength in the hip flexor knee extensor ankle dorsiflexor.  Mild hypersensitive to touch Ambulates without assistive device no evidence of toe drag or knee instability     Assessment & Plan:  #1.  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 She has continued symptoms and chronic pain as a result. When she first came to this clinic she was on high-dose opioids which were weaned.  She is now on 35 morphine milligram equivalents. She is on both long-acting as well as short acting tramadol to coincide with her times of maximum physical activity during cooking lunch and dinner. She also takes hydrocodone at night for sleep and over the past year has added a second dose in the morning because of difficulty with increased pain at that time as well. We reviewed her medications and determined that the tizanidine can be stopped.  She may approach her primary physician to see if he can prescribe if the discontinuation causes increase muscle spasm in the left trapezius area  She is no longer using a Worker's Comp. system to find her ibuprofen.  She takes that on a as needed basis.  The current prescriptions prescribed this office and submitted to [employer] system include the following Hydrocodone 7.5 twice daily Tramadol ER 100 mg daily Tramadol 50 mg immediate release twice a day Duloxetine 90 mg/day Topiramate 100 mg in the morning and 200 mg in the evening On 50 mg nightly  Nurse practitioner visit in 1 month  Over half of the 40 min visit was spent counseling and coordinating care.  PA DT form was completed

## 2019-07-23 NOTE — Patient Instructions (Signed)
Will no longer prescribe tizanidine, mesaage sent to Dr Silvio Pate

## 2019-07-25 ENCOUNTER — Telehealth: Payer: Self-pay | Admitting: *Deleted

## 2019-07-25 MED ORDER — TIZANIDINE HCL 4 MG PO TABS: ORAL_TABLET | ORAL | 0 refills | Status: DC

## 2019-07-25 NOTE — Telephone Encounter (Signed)
Tizanidine reorder was placed as NO PRINT so that no order would go to pharmacy. It documented a note that Dr Silvio Pate will assume prescribing so that no clinical staff would reorder medication [facility].

## 2019-07-25 NOTE — Telephone Encounter (Signed)
-----   Message from [name], MD sent at 07/24/2019 10:12 AM EDT ----- Ok thanks.We will let her know ----- Message ----- From: Venia Carbon, MD Sent: 07/24/2019   7:25 AM EDT To: [name], MD  Yes It looks like she has enough into November--but I can take over then.  Rich ----- Message ----- From: [name], MD Sent: 07/23/2019   2:50 PM EDT To: Venia Carbon, MD  Workers comp is no longer paying for Tizanidine , could you precribe 72m BID for this pt?  Thanks  ALeroy SeaMD

## 2019-08-01 ENCOUNTER — Other Ambulatory Visit: Payer: Self-pay | Admitting: Internal Medicine

## 2019-08-02 ENCOUNTER — Other Ambulatory Visit: Payer: Self-pay | Admitting: Registered Nurse

## 2019-08-19 ENCOUNTER — Other Ambulatory Visit: Payer: Self-pay | Admitting: Internal Medicine

## 2019-08-20 ENCOUNTER — Encounter: Payer: Self-pay | Admitting: Registered Nurse

## 2019-08-20 ENCOUNTER — Encounter: Payer: Worker's Compensation | Attending: Physical Medicine & Rehabilitation | Admitting: Registered Nurse

## 2019-08-20 ENCOUNTER — Other Ambulatory Visit: Payer: Self-pay

## 2019-08-20 VITALS — BP 118/75 | HR 83 | Temp 97.5°F | Ht 67.0 in | Wt 168.0 lb

## 2019-08-20 DIAGNOSIS — G4700 Insomnia, unspecified: Secondary | ICD-10-CM | POA: Insufficient documentation

## 2019-08-20 DIAGNOSIS — G90512 Complex regional pain syndrome I of left upper limb: Secondary | ICD-10-CM | POA: Diagnosis present

## 2019-08-20 DIAGNOSIS — F3289 Other specified depressive episodes: Secondary | ICD-10-CM | POA: Diagnosis present

## 2019-08-20 DIAGNOSIS — G894 Chronic pain syndrome: Secondary | ICD-10-CM | POA: Insufficient documentation

## 2019-08-20 DIAGNOSIS — Z79899 Other long term (current) drug therapy: Secondary | ICD-10-CM | POA: Diagnosis not present

## 2019-08-20 DIAGNOSIS — G9050 Complex regional pain syndrome I, unspecified: Secondary | ICD-10-CM

## 2019-08-20 DIAGNOSIS — Z5181 Encounter for therapeutic drug level monitoring: Secondary | ICD-10-CM | POA: Insufficient documentation

## 2019-08-20 DIAGNOSIS — M62838 Other muscle spasm: Secondary | ICD-10-CM | POA: Insufficient documentation

## 2019-08-20 MED ORDER — HYDROCODONE-ACETAMINOPHEN 7.5-325 MG PO TABS: ORAL_TABLET | ORAL | 0 refills | Status: DC

## 2019-08-20 MED ORDER — TRAMADOL HCL ER 100 MG PO TB24: 100.0000 mg | ORAL_TABLET | Freq: Every day | ORAL | 2 refills | Status: DC

## 2019-08-20 MED ORDER — TRAMADOL HCL 50 MG PO TABS: ORAL_TABLET | ORAL | 2 refills | Status: DC

## 2019-08-20 NOTE — Progress Notes (Signed)
Subjective:    Patient ID: [name], female    DOB: 07/24/1966, 53 y.o.   MRN: [id number]  HPI: [name] is a 53 y.o. female who returns for follow up appointment for chronic pain and medication refill. She states her pain is located in her left arm and reports generalized pain all over. She  Rates her pain 9. Her current exercise regime is walking and performing stretching exercises.  [name] Morphine equivalent is 35.00 MME.  Oral Swab ordered today.   Pain Inventory Average Pain 8 Pain Right Now 9 My pain is intermittent, constant, sharp, burning, stabbing, tingling and aching  In the last 24 hours, has pain interfered with the following? General activity 4 Relation with others 4 Enjoyment of life 4 What TIME of day is your pain at its worst? all Sleep (in general) Poor  Pain is worse with: walking, bending, sitting and standing Pain improves with: rest, heat/ice, therapy/exercise, pacing activities, medication and TENS Relief from Meds: 5  Mobility ability to climb steps?  yes do you drive?  yes  Function disabled: date disabled . I need assistance with the following:  meal prep, household duties and shopping  Neuro/Psych bladder control problems weakness numbness tremor tingling trouble walking spasms dizziness confusion depression anxiety  Prior Studies Any changes since last visit?  no  Physicians involved in your care Any changes since last visit?  no   Family History  Problem Relation Age of Onset  . Cancer Mother        lung  . Hypertension Mother   . Cancer Father        colon  . Hypertension Sister   . Cancer Sister   . Cancer Brother   . Hyperlipidemia Sister   . Birth defects Maternal Grandmother        breast  . Breast cancer Maternal Grandmother   . Birth defects Paternal Grandmother        uterine, stomach, lung  . Breast cancer Paternal Grandmother    Social History   Socioeconomic  History  . Marital status: Married    Spouse name: Not on file  . Number of children: 2  . Years of education: Not on file  . Highest education level: Not on file  Occupational History  . 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 Financial resource strain: Not on file  . Food insecurity    Worry: Not on file    Inability: Not on file  . Transportation needs    Medical: Not on file    Non-medical: Not on file  Tobacco Use  . Smoking status: Never Smoker  . Smokeless tobacco: Never Used  Substance and Sexual Activity  . Alcohol use: Yes    Alcohol/week: 0.0 standard drinks    Comment: 01/24/2013 "drink or 2 once or /twice/year, if that"  . Drug use: No  . Sexual activity: Not Currently  Lifestyle  . Physical activity    Days per week: Not on file    Minutes per session: Not on file  . Stress: Not on file  Relationships  . Social Musician on phone: Not on file    Gets together: Not on file    Attends religious service: Not on file    Active member of club or organization: Not on file    Attends meetings of clubs or organizations: Not on file    Relationship status: Not on file  Other  Topics Concern  . Not on file  Social History Narrative   No living will   Would want husband as POA--then sister, [name]   Would accept resuscitation attempts   Probably would not want tube feeds if cognitively unaware   Past Surgical History:  Procedure Laterality Date  . APPENDECTOMY  ~ 06/2007  . BLADDER REPAIR  ~ 06/2007   "same day after bladder lift" (01/24/2013)  . BLADDER SUSPENSION  ~ 06/2007  . BREAST BIOPSY Left   . DIAGNOSTIC LAPAROSCOPY  1990's & ~ 2000   "I've had a couple; for endometrosis" (01/24/2013)  . HERNIA REPAIR    . INCISIONAL HERNIA REPAIR N/A 01/24/2013   Procedure: LAPAROSCOPIC INCISIONAL HERNIA;  Surgeon: Shelly Rubensteinouglas A Blackman, MD;  [facility];  Service: General;  Laterality: N/A;  . INSERTION OF MESH N/A 01/24/2013   Procedure: INSERTION OF  MESH;  Surgeon: Shelly Rubensteinouglas A Blackman, MD;  Location: [facility];  Service: General;  Laterality: N/A;  . LAPAROSCOPIC INCISIONAL / UMBILICAL / VENTRAL HERNIA REPAIR  01/24/2013   IHR w/mesh/notes  . NASAL SEPTUM SURGERY  1980's?  . OOPHORECTOMY Right 2009  . TONSILLECTOMY  1990's  . VAGINAL HYSTERECTOMY  ` 06/2007   Past Medical History:  Diagnosis Date  . Allergy   . Anxiety   . Arthritis    "just the norm" (01/24/2013)  . ASCVD (arteriosclerotic cardiovascular disease)   . Asthma   . Celiac disease   . Daily headache    "depends on the season" (01/24/2013)  . Fibromyalgia   . GERD (gastroesophageal reflux disease)   . History of colonic polyps   . Hyperlipidemia   . Hypertension   . Migraines   . Obesity (BMI 30.0-34.9)   . RSD (reflex sympathetic dystrophy)   . Sleep apnea    NO MACHINE RECOMMENDED  . TIA (transient ischemic attack) ~ 2009   BP 118/75   Pulse 83   Temp (!) 97.5 F (36.4 C)   Ht 5\' 7"  (1.702 m)   Wt 168 lb (76.2 kg)   SpO2 95%   BMI 26.31 kg/m   Opioid Risk Score:   Fall Risk Score:  `1  Depression screen PHQ 2/9  [facility] 02/26/2019 11/29/2018 09/07/2018 05/09/2018 02/06/2018 01/09/2018 10/04/2017  Decreased Interest 1 1 1 1 1 1 1   Down, Depressed, Hopeless 1 1 1 1 1 1 1   PHQ - 2 Score 2 2 2 2 2 2 2   Altered sleeping - - - - 0 - -  Tired, decreased energy - - - - 1 - -  Change in appetite - - - - 0 - -  Feeling bad or failure about yourself  - - - - 0 - -  Trouble concentrating - - - - 0 - -  Moving slowly or fidgety/restless - - - - 0 - -  Suicidal thoughts - - - - 0 - -  PHQ-9 Score - - - - 3 - -  Difficult doing work/chores - - - - Not difficult at all - -  Some recent data might be hidden     Review of Systems  Constitutional: Negative.   HENT: Negative.   Eyes: Negative.   Respiratory: Negative.   Cardiovascular: Negative.   Gastrointestinal: Negative.   Endocrine: Negative.   Genitourinary: Positive for difficulty  urinating.  Musculoskeletal: Positive for arthralgias, gait problem and myalgias.  Skin: Negative.   Allergic/Immunologic: Negative.   Neurological: Positive for dizziness, tremors, weakness  and numbness.  Hematological: Negative.   Psychiatric/Behavioral: Positive for confusion and dysphoric mood. The patient is nervous/anxious.   All other systems reviewed and are negative.      Objective:   Physical Exam Vitals signs and nursing note reviewed.  Constitutional:      Appearance: Normal appearance.  Neck:     Musculoskeletal: Normal range of motion and neck supple.  Cardiovascular:     Rate and Rhythm: Normal rate and regular rhythm.     Pulses: Normal pulses.     Heart sounds: Normal heart sounds.  Pulmonary:     Effort: Pulmonary effort is normal.     Breath sounds: Normal breath sounds.  Musculoskeletal:     Comments: Normal Muscle Bulk and Muscle Testing Reveals:  Upper Extremities: Right: Full ROM and Muscle Strength 4/5 Left: Decreased ROM 90 Degrees and Muscle Strength 3/5  Thoracic Paraspinal Tenderness: T-7-T-9 Lumbar Paraspinal Tenderness: L-3-L-5 Lower Extremities: Full ROM and Muscle Strength 5/5 Bilateral Lower Extremity Flexion Produces Pain into Lower Extremity and Bilateral Feet Arises from Table slowly Antalgic  Gait   Skin:    General: Skin is warm and dry.  Neurological:     Mental Status: She is alert and oriented to person, place, and time.  Psychiatric:        Mood and Affect: Mood normal.        Behavior: Behavior normal.           Assessment & Plan:  1.Complex Regional Pain Syndrome Type 1: Continue Topamax and Cymbalta.08/23/2019. Refilled:Hydro-codone 7.5/325mg  one tablet twice a day one in the morning and one at bedtime#60. Continue:Tramadol 100 mg ER daily #30 and Tramadol 50 mg one tablet by mouth BID. One tablet in the afternoon and one tablet in the evening. We will continue the opioid monitoring program, this consists of regular  clinic visits, examinations, urine drug screen, pill counts as well as use of [place] Controlled Substance Reporting System. 2. Depression: Continuecurrent medication regimen withCymbalta.08/23/2019 3. Insomnia: Continuecurrent medication regimen withTrazodone50 mg HS. 08/23/2019 4. Muscle Spasms: Continuecurrent medication regimen withTizanidine.08/23/2019 5. Chronic Pain Syndrome: Continue IbuprofenBID as needed.08/23/2019  F/U in 1 month   72minutes of face to face patient care time was spent during this visit. All questions were encouraged and answered.

## 2019-08-23 LAB — DRUG TOX MONITOR 1 W/CONF, ORAL FLD
Amphetamines: NEGATIVE ng/mL (ref <10)
Barbiturates: NEGATIVE ng/mL (ref <10)
Benzodiazepines: NEGATIVE ng/mL (ref <0.50)
Buprenorphine: NEGATIVE ng/mL (ref <0.10)
Cocaine: NEGATIVE ng/mL (ref <5.0)
Codeine: NEGATIVE ng/mL (ref <2.5)
Dihydrocodeine: 3.12 ng/mL — ABNORMAL HIGH (ref <2.5)
Fentanyl: NEGATIVE ng/mL (ref <0.10)
Heroin Metabolite: NEGATIVE ng/mL (ref <1.0)
Hydrocodone: 29.6 ng/mL — ABNORMAL HIGH (ref <2.5)
Hydromorphone: NEGATIVE ng/mL (ref <2.5)
MARIJUANA: NEGATIVE ng/mL (ref <2.5)
MDMA: NEGATIVE ng/mL (ref <10)
Meprobamate: NEGATIVE ng/mL (ref <2.5)
Methadone: NEGATIVE ng/mL (ref <5.0)
Morphine: NEGATIVE ng/mL (ref <2.5)
Nicotine Metabolite: NEGATIVE ng/mL (ref <5.0)
Norhydrocodone: 4.1 ng/mL — ABNORMAL HIGH (ref <2.5)
Noroxycodone: NEGATIVE ng/mL (ref <2.5)
Opiates: POSITIVE ng/mL — AB (ref <2.5)
Oxycodone: NEGATIVE ng/mL (ref <2.5)
Oxymorphone: NEGATIVE ng/mL (ref <2.5)
Phencyclidine: NEGATIVE ng/mL (ref <10)
Tapentadol: NEGATIVE ng/mL (ref <5.0)
Tramadol: >500 ng/mL — ABNORMAL HIGH (ref <5.0)
Tramadol: POSITIVE ng/mL — AB (ref <5.0)
Zolpidem: NEGATIVE ng/mL (ref <5.0)

## 2019-08-23 LAB — DRUG TOX ALC METAB W/CON, ORAL FLD: Alcohol Metabolite: NEGATIVE ng/mL (ref <25)

## 2019-08-24 DIAGNOSIS — R69 Illness, unspecified: Secondary | ICD-10-CM | POA: Diagnosis not present

## 2019-08-27 ENCOUNTER — Telehealth: Payer: Self-pay | Admitting: *Deleted

## 2019-08-27 NOTE — Telephone Encounter (Signed)
Oral swab drug screen was consistent for prescribed medications.  ?

## 2019-09-04 ENCOUNTER — Other Ambulatory Visit: Payer: Self-pay | Admitting: Registered Nurse

## 2019-09-18 ENCOUNTER — Other Ambulatory Visit: Payer: Self-pay | Admitting: Registered Nurse

## 2019-09-19 ENCOUNTER — Encounter: Payer: Self-pay | Admitting: Registered Nurse

## 2019-09-19 ENCOUNTER — Other Ambulatory Visit: Payer: Self-pay

## 2019-09-19 ENCOUNTER — Encounter: Payer: Worker's Compensation | Attending: Physical Medicine & Rehabilitation | Admitting: Registered Nurse

## 2019-09-19 VITALS — HR 86 | Temp 97.5°F | Ht 67.0 in | Wt 169.0 lb

## 2019-09-19 DIAGNOSIS — F3289 Other specified depressive episodes: Secondary | ICD-10-CM | POA: Insufficient documentation

## 2019-09-19 DIAGNOSIS — G894 Chronic pain syndrome: Secondary | ICD-10-CM | POA: Diagnosis present

## 2019-09-19 DIAGNOSIS — Z5181 Encounter for therapeutic drug level monitoring: Secondary | ICD-10-CM | POA: Insufficient documentation

## 2019-09-19 DIAGNOSIS — Z79899 Other long term (current) drug therapy: Secondary | ICD-10-CM | POA: Diagnosis present

## 2019-09-19 DIAGNOSIS — M62838 Other muscle spasm: Secondary | ICD-10-CM | POA: Diagnosis present

## 2019-09-19 DIAGNOSIS — G4700 Insomnia, unspecified: Secondary | ICD-10-CM | POA: Diagnosis not present

## 2019-09-19 DIAGNOSIS — G90512 Complex regional pain syndrome I of left upper limb: Secondary | ICD-10-CM | POA: Diagnosis present

## 2019-09-19 MED ORDER — IBUPROFEN 600 MG PO TABS: 600.0000 mg | ORAL_TABLET | Freq: Three times a day (TID) | ORAL | 5 refills | Status: DC | PRN

## 2019-09-19 MED ORDER — HYDROCODONE-ACETAMINOPHEN 7.5-325 MG PO TABS: ORAL_TABLET | ORAL | 0 refills | Status: DC

## 2019-09-19 MED ORDER — TRAZODONE HCL 50 MG PO TABS: ORAL_TABLET | ORAL | 3 refills | Status: DC

## 2019-09-19 NOTE — Progress Notes (Signed)
Subjective:    Patient ID: [name], female    DOB: [birth year], 53 y.o.   MRN: [id number]  HPI: [name] is a 53 y.o. female who returns for follow up appointment for chronic pain and medication refill. She states her pain is located in her left arm and left wrist. Also reports generalized pain all over. She rates her pain 10. Her current exercise regime is walking short distances.   [name]  Morphine equivalent is 35.00  MME.  Last Oral Swab was Performed on 08/20/2019, it was consistent.   [name] blood pressure was 124/75.  Pain Inventory Average Pain 8 Pain Right Now 10 My pain is intermittent, constant, sharp, burning, stabbing, tingling and aching  In the last 24 hours, has pain interfered with the following? General activity 1 Relation with others 1 Enjoyment of life 1 What TIME of day is your pain at its worst? all Sleep (in general) Poor  Pain is worse with: walking, bending, sitting, standing and some activites Pain improves with: rest, therapy/exercise, pacing activities, medication and TENS Relief from Meds: 6  Mobility walk without assistance ability to climb steps?  yes do you drive?  yes  Function disabled: date disabled .  Neuro/Psych bladder control problems weakness numbness tremor tingling trouble walking spasms dizziness depression anxiety  Prior Studies Any changes since last visit?  no  Physicians involved in your care Any changes since last visit?  no   Family History  Problem Relation Age of Onset  . Cancer Mother        lung  . Hypertension Mother   . Cancer Father        colon  . Hypertension Sister   . Cancer Sister   . Cancer Brother   . Hyperlipidemia Sister   . Birth defects Maternal Grandmother        breast  . Breast cancer Maternal Grandmother   . Birth defects Paternal Grandmother        uterine, stomach, lung  . Breast cancer Paternal Grandmother    Social History    Socioeconomic History  . Marital status: Married    Spouse name: Not on file  . Number of children: 2  . Years of education: Not on file  . Highest education level: Not on file  Occupational History  . 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 Financial resource strain: Not on file  . Food insecurity    Worry: Not on file    Inability: Not on file  . Transportation needs    Medical: Not on file    Non-medical: Not on file  Tobacco Use  . Smoking status: Never Smoker  . Smokeless tobacco: Never Used  Substance and Sexual Activity  . Alcohol use: Yes    Alcohol/week: 0.0 standard drinks    Comment: 01/24/2013 "drink or 2 once or /twice/year, if that"  . Drug use: No  . Sexual activity: Not Currently  Lifestyle  . Physical activity    Days per week: Not on file    Minutes per session: Not on file  . Stress: Not on file  Relationships  . Social Musician on phone: Not on file    Gets together: Not on file    Attends religious service: Not on file    Active member of club or organization: Not on file    Attends meetings of clubs or organizations: Not on file    Relationship status:  Not on file  Other Topics Concern  . Not on file  Social History Narrative   No living will   Would want husband as POA--then sister, Neoma Laming   Would accept resuscitation attempts   Probably would not want tube feeds if cognitively unaware   Past Surgical History:  Procedure Laterality Date  . APPENDECTOMY  ~ 06/2007  . BLADDER REPAIR  ~ 06/2007   "same day after bladder lift" (01/24/2013)  . BLADDER SUSPENSION  ~ 06/2007  . BREAST BIOPSY Left   . DIAGNOSTIC LAPAROSCOPY  1990's & ~ 2000   "I've had a couple; for endometrosis" (01/24/2013)  . HERNIA REPAIR    . INCISIONAL HERNIA REPAIR N/A 01/24/2013   Procedure: LAPAROSCOPIC INCISIONAL HERNIA;  Surgeon: Harl Bowie, MD;  Location: [place];  Service: General;  Laterality: N/A;  . INSERTION OF MESH N/A 01/24/2013    Procedure: INSERTION OF MESH;  Surgeon: Harl Bowie, MD;  Location: [place];  Service: General;  Laterality: N/A;  . LAPAROSCOPIC INCISIONAL / UMBILICAL / [place]  01/24/2013   IHR w/mesh/notes  . NASAL SEPTUM SURGERY  1980's?  . OOPHORECTOMY Right 2009  . TONSILLECTOMY  1990's  . VAGINAL HYSTERECTOMY  ` 06/2007   Past Medical History:  Diagnosis Date  . Allergy   . Anxiety   . Arthritis    "just the norm" (01/24/2013)  . ASCVD (arteriosclerotic cardiovascular disease)   . Asthma   . Celiac disease   . Daily headache    "depends on the season" (01/24/2013)  . Fibromyalgia   . GERD (gastroesophageal reflux disease)   . History of colonic polyps   . Hyperlipidemia   . Hypertension   . Migraines   . Obesity (BMI 30.0-34.9)   . RSD (reflex sympathetic dystrophy)   . Sleep apnea    NO MACHINE RECOMMENDED  . TIA (transient ischemic attack) ~ 2009   Pulse 86   Temp (!) 97.5 F (36.4 C)   Ht 5\' 7"  (1.702 m)   Wt 169 lb (76.7 kg)   SpO2 96%   BMI 26.47 kg/m   Opioid Risk Score:   Fall Risk Score:  `1  Depression screen PHQ 2/9  Depression screen [facility] 2/9 02/26/2019 11/29/2018 09/07/2018 05/09/2018 02/06/2018 01/09/2018 10/04/2017  Decreased Interest 1 1 1 1 1 1 1   Down, Depressed, Hopeless 1 1 1 1 1 1 1   PHQ - 2 Score 2 2 2 2 2 2 2   Altered sleeping - - - - 0 - -  Tired, decreased energy - - - - 1 - -  Change in appetite - - - - 0 - -  Feeling bad or failure about yourself  - - - - 0 - -  Trouble concentrating - - - - 0 - -  Moving slowly or fidgety/restless - - - - 0 - -  Suicidal thoughts - - - - 0 - -  PHQ-9 Score - - - - 3 - -  Difficult doing work/chores - - - - Not difficult at all - -  Some recent data might be hidden    Review of Systems  Constitutional: Negative.   HENT: Negative.   Eyes: Negative.   Respiratory: Negative.   Cardiovascular: Negative.   Gastrointestinal: Negative.   Endocrine: Negative.   Genitourinary: Positive for difficulty  urinating.  Musculoskeletal: Positive for arthralgias, myalgias, neck pain and neck stiffness.       Spasms   Allergic/Immunologic: Negative.  Neurological: Positive for tremors, weakness and numbness.       Tingling   Psychiatric/Behavioral: Positive for dysphoric mood. The patient is nervous/anxious.        Objective:   Physical Exam Vitals signs and nursing note reviewed.  Constitutional:      Appearance: Normal appearance.  Neck:     Musculoskeletal: Normal range of motion and neck supple.     Comments: Cervical Paraspinal Tenderness: C-5-C-6  Cardiovascular:     Rate and Rhythm: Normal rate and regular rhythm.     Pulses: Normal pulses.     Heart sounds: Normal heart sounds.  Pulmonary:     Effort: Pulmonary effort is normal.     Breath sounds: Normal breath sounds.  Musculoskeletal:     Comments: Normal Muscle Bulk and Muscle Testing Reveals:  Upper Extremities: Right: Full ROM and Muscle Strength 4/5 Left: Decreased ROM 45 Degrees and Muscle Strength 3/5 Bilateral AC Joint Tenderness  Thoracic and Lumbar Hypersensitivity Lower Extremities: Full ROM and Muscle Strength 5/5 Arises from Table Slowly Antalgic  Gait   Skin:    General: Skin is warm and dry.  Neurological:     Mental Status: She is alert and oriented to person, place, and time.  Psychiatric:        Mood and Affect: Mood normal.        Behavior: Behavior normal.           Assessment & Plan:  1.Complex Regional Pain Syndrome Type 1: Continue Topamax and Cymbalta.09/19/2019. Refilled:Hydro-codone 7.5/325mg  one tablet twice a day one in the morning and one at bedtime#60. Continue:Tramadol 100 mg ER daily #30 and Tramadol 50 mg one tablet by mouth BID. One tablet in the afternoon and one tablet in the evening. We will continue the opioid monitoring program, this consists of regular clinic visits, examinations, urine drug screen, pill counts as well as use of [place]  Controlled Substance  Reporting System. 2. Depression: Continuecurrent medication regimen withCymbalta.09/19/2019 3. Insomnia: Continuecurrent medication regimen withTrazodone50 mg HS. 09/19/2019 4. Muscle Spasms: Continuecurrent medication regimen withTizanidine.09/19/2019 5. Chronic Pain Syndrome: Continue IbuprofenBID as needed.09/19/2019  F/U in 1 month   15minutes of face to face patient care time was spent during this visit. All questions were encouraged and answered.

## 2019-10-18 ENCOUNTER — Other Ambulatory Visit: Payer: Self-pay

## 2019-10-18 MED ORDER — TOPIRAMATE 100 MG PO TABS: ORAL_TABLET | ORAL | 3 refills | Status: DC

## 2019-10-18 NOTE — Telephone Encounter (Signed)
Recieved faxed document requesting a prior authorization to be preformed via the [place] workers compensation provider portal which we here at [facility] do NOT have access to.  Contacted patient and they have requested the medication to be sent to there normal pharmacy through private insurance.

## 2019-10-25 ENCOUNTER — Telehealth: Payer: Self-pay | Admitting: Physical Medicine & Rehabilitation

## 2019-10-25 NOTE — Telephone Encounter (Signed)
Patient called needing a letter for workers comp. Her workers comp is requiring a [employer]ar[employer] manager before approving medications. Please call patient.

## 2019-10-26 NOTE — Telephone Encounter (Signed)
Letter printed for Dr Letta Pate to sign. It will be faxed with notes to her attorney, [facility], [phone].

## 2019-10-29 ENCOUNTER — Other Ambulatory Visit: Payer: Self-pay

## 2019-10-29 ENCOUNTER — Encounter: Payer: Self-pay | Admitting: Registered Nurse

## 2019-10-29 ENCOUNTER — Encounter: Payer: Worker's Compensation | Attending: Physical Medicine & Rehabilitation | Admitting: Registered Nurse

## 2019-10-29 VITALS — BP 125/81 | HR 79 | Temp 97.7°F | Ht 67.0 in | Wt 168.0 lb

## 2019-10-29 DIAGNOSIS — G90512 Complex regional pain syndrome I of left upper limb: Secondary | ICD-10-CM | POA: Diagnosis present

## 2019-10-29 DIAGNOSIS — Z5181 Encounter for therapeutic drug level monitoring: Secondary | ICD-10-CM | POA: Diagnosis present

## 2019-10-29 DIAGNOSIS — G4700 Insomnia, unspecified: Secondary | ICD-10-CM | POA: Insufficient documentation

## 2019-10-29 DIAGNOSIS — G894 Chronic pain syndrome: Secondary | ICD-10-CM | POA: Insufficient documentation

## 2019-10-29 DIAGNOSIS — Z79899 Other long term (current) drug therapy: Secondary | ICD-10-CM | POA: Diagnosis present

## 2019-10-29 DIAGNOSIS — F3289 Other specified depressive episodes: Secondary | ICD-10-CM | POA: Diagnosis present

## 2019-10-29 DIAGNOSIS — M62838 Other muscle spasm: Secondary | ICD-10-CM

## 2019-10-29 MED ORDER — TRAMADOL HCL 50 MG PO TABS: ORAL_TABLET | ORAL | 2 refills | Status: DC

## 2019-10-29 MED ORDER — TRAMADOL HCL ER 100 MG PO TB24: 100.0000 mg | ORAL_TABLET | Freq: Every day | ORAL | 2 refills | Status: DC

## 2019-10-29 MED ORDER — HYDROCODONE-ACETAMINOPHEN 7.5-325 MG PO TABS: ORAL_TABLET | ORAL | 0 refills | Status: DC

## 2019-10-29 NOTE — Progress Notes (Signed)
Subjective:    Patient ID: [name], female    DOB: 1966/09/08, 54 y.o.   MRN: [id number]  HPI: [name] is a 54 y.o. female who returns for follow up appointment for chronic pain and medication refill. She states her pain is located in her left arm and left wrist. Also reports generalized pain all over. She rates her pain 9. Her current exercise regime is walking and performing stretching exercises.  [name] Morphine equivalent is 35.00 MME.  Last Oral Swab was Performed on 08/30/2019, it was consistent.   Pain Inventory Average Pain 8 Pain Right Now 9 My pain is intermittent, constant, sharp, burning, dull, stabbing, tingling and aching  In the last 24 hours, has pain interfered with the following? General activity 7 Relation with others 7 Enjoyment of life 7 What TIME of day is your pain at its worst? all Sleep (in general) Poor  Pain is worse with: walking, bending, sitting and standing Pain improves with: rest, heat/ice, therapy/exercise, pacing activities, medication and TENS Relief from Meds: 7  Mobility ability to climb steps?  yes do you drive?  yes  Function disabled: date disabled 12/94 I need assistance with the following:  meal prep, household duties and shopping  Neuro/Psych bladder control problems weakness numbness tremor tingling trouble walking spasms dizziness depression anxiety  Prior Studies Any changes since last visit?  no  Physicians involved in your care Any changes since last visit?  no   Family History  Problem Relation Age of Onset  . Cancer Mother        lung  . Hypertension Mother   . Cancer Father        colon  . Hypertension Sister   . Cancer Sister   . Cancer Brother   . Hyperlipidemia Sister   . Birth defects Maternal Grandmother        breast  . Breast cancer Maternal Grandmother   . Birth defects Paternal Grandmother        uterine, stomach, lung  . Breast cancer Paternal  Grandmother    Social History   Socioeconomic History  . Marital status: Married    Spouse name: Not on file  . Number of children: 2  . Years of education: Not on file  . Highest education level: Not on file  Occupational History  . 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 Smoking status: Never Smoker  . Smokeless tobacco: Never Used  Substance and Sexual Activity  . Alcohol use: Yes    Alcohol/week: 0.0 standard drinks    Comment: 01/24/2013 "drink or 2 once or /twice/year, if that"  . Drug use: No  . Sexual activity: Not Currently  Other Topics Concern  . Not on file  Social History Narrative   No living will   Would want husband as POA--then sister, Neoma Laming   Would accept resuscitation attempts   Probably would not want tube feeds if cognitively unaware   Social Determinants of Health   Financial Resource Strain:   . Difficulty of Paying Living Expenses: Not on file  Food Insecurity:   . Worried About Charity fundraiser in the Last Year: Not on file  . Ran Out of Food in the Last Year: Not on file  Transportation Needs:   . Lack of Transportation (Medical): Not on file  . Lack of Transportation (Non-Medical): Not on file  Physical Activity:   . Days of Exercise per Week: Not on file  .  Minutes of Exercise per Session: Not on file  Stress:   . Feeling of Stress : Not on file  Social Connections:   . Frequency of Communication with Friends and Family: Not on file  . Frequency of Social Gatherings with Friends and Family: Not on file  . Attends Religious Services: Not on file  . Active Member of Clubs or Organizations: Not on file  . Attends Archivist Meetings: Not on file  . Marital Status: Not on file   Past Surgical History:  Procedure Laterality Date  . APPENDECTOMY  ~ 06/2007  . BLADDER REPAIR  ~ 06/2007   "same day after bladder lift" (01/24/2013)  . BLADDER SUSPENSION  ~ 06/2007  . BREAST BIOPSY Left   . DIAGNOSTIC LAPAROSCOPY  1990's & ~  2000   "I've had a couple; for endometrosis" (01/24/2013)  . HERNIA REPAIR    . INCISIONAL HERNIA REPAIR N/A 01/24/2013   Procedure: LAPAROSCOPIC INCISIONAL HERNIA;  Surgeon: Harl Bowie, MD;  Location: [place];  Service: General;  Laterality: N/A;  . INSERTION OF MESH N/A 01/24/2013   Procedure: INSERTION OF MESH;  Surgeon: Harl Bowie, MD;  Location: [place];  Service: General;  Laterality: N/A;  . LAPAROSCOPIC INCISIONAL / UMBILICAL / [place]  01/24/2013   IHR w/mesh/notes  . NASAL SEPTUM SURGERY  1980's?  . OOPHORECTOMY Right 2009  . TONSILLECTOMY  1990's  . VAGINAL HYSTERECTOMY  ` 06/2007   Past Medical History:  Diagnosis Date  . Allergy   . Anxiety   . Arthritis    "just the norm" (01/24/2013)  . ASCVD (arteriosclerotic cardiovascular disease)   . Asthma   . Celiac disease   . Daily headache    "depends on the season" (01/24/2013)  . Fibromyalgia   . GERD (gastroesophageal reflux disease)   . History of colonic polyps   . Hyperlipidemia   . Hypertension   . Migraines   . Obesity (BMI 30.0-34.9)   . RSD (reflex sympathetic dystrophy)   . Sleep apnea    NO MACHINE RECOMMENDED  . TIA (transient ischemic attack) ~ 2009   Temp 97.7 F (36.5 C)   Ht 5' 7"  (1.702 m)   Wt 168 lb (76.2 kg)   BMI 26.31 kg/m   Opioid Risk Score:   Fall Risk Score:  `1  Depression screen PHQ 2/9  Depression screen [facility] 2/9 02/26/2019 11/29/2018 09/07/2018 05/09/2018 02/06/2018 01/09/2018 10/04/2017  Decreased Interest 1 1 1 1 1 1 1   Down, Depressed, Hopeless 1 1 1 1 1 1 1   PHQ - 2 Score 2 2 2 2 2 2 2   Altered sleeping - - - - 0 - -  Tired, decreased energy - - - - 1 - -  Change in appetite - - - - 0 - -  Feeling bad or failure about yourself  - - - - 0 - -  Trouble concentrating - - - - 0 - -  Moving slowly or fidgety/restless - - - - 0 - -  Suicidal thoughts - - - - 0 - -  PHQ-9 Score - - - - 3 - -  Difficult doing work/chores - - - - Not difficult at all - -  Some  recent data might be hidden   Review of Systems  Musculoskeletal: Positive for gait problem.  Neurological: Positive for weakness and numbness.  Psychiatric/Behavioral: Positive for dysphoric mood. The patient is nervous/anxious.   All other systems reviewed and  are negative.      Objective:   Physical Exam Vitals and nursing note reviewed.  Constitutional:      Appearance: Normal appearance.  Cardiovascular:     Rate and Rhythm: Normal rate and regular rhythm.     Pulses: Normal pulses.     Heart sounds: Normal heart sounds.  Pulmonary:     Effort: Pulmonary effort is normal.     Breath sounds: Normal breath sounds.  Musculoskeletal:     Cervical back: Normal range of motion and neck supple.     Comments: Normal Muscle Bulk and Muscle Testing Reveals:  Upper Extremities: Right: Full ROM and Muscle Strength 5/5 Left: Decreased ROM 90 Degrees and Muscle Strength 3/5 Left AC Joint Tenderness Lumbar Paraspinal Tenderness: L-4-L-5 Lower Extremities: Decreased ROM and Muscle Strength 4/5 Bilateral Lower Extremities Flexion Produces Pain into her Bilateral Lower Extremities and Bilateral Feet Arises from Table Slowly Antalgic Gait  Skin:    General: Skin is warm and dry.  Neurological:     Mental Status: She is alert and oriented to person, place, and time.  Psychiatric:        Mood and Affect: Mood normal.        Behavior: Behavior normal.           Assessment & Plan:  1.Complex Regional Pain Syndrome Type 1: Continue Topamax and Cymbalta.10/29/2019. Refilled:Hydro-codone 7.5/325mg one tablet twice a day one in the morning and one at bedtime#60. Continue:Tramadol 100 mg ER daily #30 and Tramadol 50 mg one tablet by mouth BID. One tablet in the afternoon and one tablet in the evening. We will continue the opioid monitoring program, this consists of regular clinic visits, examinations, urine drug screen, pill counts as well as use of [place] Controlled Substance  Reporting System. 2. Depression: Continuecurrent medication regimen withCymbalta.10/29/2019 3. Insomnia: Continuecurrent medication regimen withTrazodone50 mg HS.10/29/2019 4. Muscle Spasms: Continuecurrent medication regimen withTizanidine.10/29/2019 5. Chronic Pain Syndrome: Continue IbuprofenBID as needed.10/29/2019  F/U in 1 month   44mnutes of face to face patient care time was spent during this visit. All questions were encouraged and answered.

## 2019-12-12 ENCOUNTER — Other Ambulatory Visit: Payer: Self-pay

## 2019-12-12 ENCOUNTER — Encounter: Payer: Worker's Compensation | Attending: Registered Nurse | Admitting: Registered Nurse

## 2019-12-12 ENCOUNTER — Encounter: Payer: Self-pay | Admitting: Registered Nurse

## 2019-12-12 VITALS — BP 116/80 | HR 79 | Temp 97.7°F | Ht 67.0 in | Wt 168.6 lb

## 2019-12-12 DIAGNOSIS — F3289 Other specified depressive episodes: Secondary | ICD-10-CM | POA: Insufficient documentation

## 2019-12-12 DIAGNOSIS — Z5181 Encounter for therapeutic drug level monitoring: Secondary | ICD-10-CM | POA: Diagnosis present

## 2019-12-12 DIAGNOSIS — G90512 Complex regional pain syndrome I of left upper limb: Secondary | ICD-10-CM | POA: Diagnosis present

## 2019-12-12 DIAGNOSIS — Z79899 Other long term (current) drug therapy: Secondary | ICD-10-CM | POA: Diagnosis present

## 2019-12-12 DIAGNOSIS — G894 Chronic pain syndrome: Secondary | ICD-10-CM | POA: Insufficient documentation

## 2019-12-12 DIAGNOSIS — M62838 Other muscle spasm: Secondary | ICD-10-CM | POA: Insufficient documentation

## 2019-12-12 DIAGNOSIS — G4700 Insomnia, unspecified: Secondary | ICD-10-CM | POA: Diagnosis present

## 2019-12-12 MED ORDER — HYDROCODONE-ACETAMINOPHEN 7.5-325 MG PO TABS: ORAL_TABLET | ORAL | 0 refills | Status: DC

## 2019-12-12 MED ORDER — TIZANIDINE HCL 4 MG PO TABS: ORAL_TABLET | ORAL | 0 refills | Status: DC

## 2019-12-12 NOTE — Progress Notes (Signed)
Subjective:    Patient ID: [name], female    DOB: 1965-12-19, 54 y.o.   MRN: [id number]  HPI: [name] is a 54 y.o. female who returns for follow up appointment for chronic pain and medication refill. She states her pain is located in her left arm and left hand. She rates her pain 8. Her current exercise regime is walking.  [name] Morphine equivalent is 35.00  MME.    Last Oral Swab was Performed on 08/20/2019, it was consistent.    Pain Inventory Average Pain 8 Pain Right Now 8 My pain is intermittent, constant, sharp, burning, stabbing, tingling and aching  In the last 24 hours, has pain interfered with the following? General activity 3 Relation with others 3 Enjoyment of life 3 What TIME of day is your pain at its worst? all Sleep (in general) Poor  Pain is worse with: walking, bending, sitting, inactivity, standing and some activites Pain improves with: rest, pacing activities and medication Relief from Meds: 6  Mobility ability to climb steps?  yes do you drive?  yes  Function disabled: date disabled 09/1993 I need assistance with the following:  meal prep, household duties and shopping  Neuro/Psych bladder control problems weakness numbness tremor tingling trouble walking spasms dizziness depression anxiety  Prior Studies Any changes since last visit?  no  Physicians involved in your care Any changes since last visit?  no   Family History  Problem Relation Age of Onset  . Cancer Mother        lung  . Hypertension Mother   . Cancer Father        colon  . Hypertension Sister   . Cancer Sister   . Cancer Brother   . Hyperlipidemia Sister   . Birth defects Maternal Grandmother        breast  . Breast cancer Maternal Grandmother   . Birth defects Paternal Grandmother        uterine, stomach, lung  . Breast cancer Paternal Grandmother    Social History   Socioeconomic History  . Marital status:  Married    Spouse name: Not on file  . Number of children: 2  . Years of education: Not on file  . Highest education level: Not on file  Occupational History  . 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 Smoking status: Never Smoker  . Smokeless tobacco: Never Used  Substance and Sexual Activity  . Alcohol use: Yes    Alcohol/week: 0.0 standard drinks    Comment: 01/24/2013 "drink or 2 once or /twice/year, if that"  . Drug use: No  . Sexual activity: Not Currently  Other Topics Concern  . Not on file  Social History Narrative   No living will   Would want husband as POA--then sister, [name]   Would accept resuscitation attempts   Probably would not want tube feeds if cognitively unaware   Social Determinants of Health   Financial Resource Strain:   . Difficulty of Paying Living Expenses: Not on file  Food Insecurity:   . Worried About Charity fundraiser in the Last Year: Not on file  . Ran Out of Food in the Last Year: Not on file  Transportation Needs:   . Lack of Transportation (Medical): Not on file  . Lack of Transportation (Non-Medical): Not on file  Physical Activity:   . Days of Exercise per Week: Not on file  . Minutes of Exercise per Session: Not  on file  Stress:   . Feeling of Stress : Not on file  Social Connections:   . Frequency of Communication with Friends and Family: Not on file  . Frequency of Social Gatherings with Friends and Family: Not on file  . Attends Religious Services: Not on file  . Active Member of Clubs or Organizations: Not on file  . Attends Archivist Meetings: Not on file  . Marital Status: Not on file   Past Surgical History:  Procedure Laterality Date  . APPENDECTOMY  ~ 06/2007  . BLADDER REPAIR  ~ 06/2007   "same day after bladder lift" (01/24/2013)  . BLADDER SUSPENSION  ~ 06/2007  . BREAST BIOPSY Left   . DIAGNOSTIC LAPAROSCOPY  1990's & ~ 2000   "I've had a couple; for endometrosis" (01/24/2013)  . HERNIA REPAIR      . INCISIONAL HERNIA REPAIR N/A 01/24/2013   Procedure: LAPAROSCOPIC INCISIONAL HERNIA;  Surgeon: Harl Bowie, MD;  Location: [facility];  Service: General;  Laterality: N/A;  . INSERTION OF MESH N/A 01/24/2013   Procedure: INSERTION OF MESH;  Surgeon: Harl Bowie, MD;  Location: [facility];  Service: General;  Laterality: N/A;  . LAPAROSCOPIC INCISIONAL / UMBILICAL / [place]  01/24/2013   IHR w/mesh/notes  . NASAL SEPTUM SURGERY  1980's?  . OOPHORECTOMY Right 2009  . TONSILLECTOMY  1990's  . VAGINAL HYSTERECTOMY  ` 06/2007   Past Medical History:  Diagnosis Date  . Allergy   . Anxiety   . Arthritis    "just the norm" (01/24/2013)  . ASCVD (arteriosclerotic cardiovascular disease)   . Asthma   . Celiac disease   . Daily headache    "depends on the season" (01/24/2013)  . Fibromyalgia   . GERD (gastroesophageal reflux disease)   . History of colonic polyps   . Hyperlipidemia   . Hypertension   . Migraines   . Obesity (BMI 30.0-34.9)   . RSD (reflex sympathetic dystrophy)   . Sleep apnea    NO MACHINE RECOMMENDED  . TIA (transient ischemic attack) ~ 2009   BP 116/80   Pulse 79   Temp 97.7 F (36.5 C)   Ht 5' 7"  (1.702 m)   Wt 168 lb 9.6 oz (76.5 kg)   SpO2 96%   BMI 26.41 kg/m   Opioid Risk Score:   Fall Risk Score:  `1  Depression screen PHQ 2/9  Depression screen [facility] 2/9 02/26/2019 11/29/2018 09/07/2018 05/09/2018 02/06/2018 01/09/2018 10/04/2017  Decreased Interest 1 1 1 1 1 1 1   Down, Depressed, Hopeless 1 1 1 1 1 1 1   PHQ - 2 Score 2 2 2 2 2 2 2   Altered sleeping - - - - 0 - -  Tired, decreased energy - - - - 1 - -  Change in appetite - - - - 0 - -  Feeling bad or failure about yourself  - - - - 0 - -  Trouble concentrating - - - - 0 - -  Moving slowly or fidgety/restless - - - - 0 - -  Suicidal thoughts - - - - 0 - -  PHQ-9 Score - - - - 3 - -  Difficult doing work/chores - - - - Not difficult at all - -  Some recent data might be hidden   Review  of Systems  Neurological: Positive for tremors, weakness and numbness.  Psychiatric/Behavioral: Positive for dysphoric mood. The patient is nervous/anxious.   All  other systems reviewed and are negative.      Objective:   Physical Exam Vitals and nursing note reviewed.  Constitutional:      Appearance: Normal appearance.  Cardiovascular:     Rate and Rhythm: Normal rate and regular rhythm.     Pulses: Normal pulses.     Heart sounds: Normal heart sounds.  Pulmonary:     Effort: Pulmonary effort is normal.     Breath sounds: Normal breath sounds.  Musculoskeletal:     Cervical back: Normal range of motion and neck supple.     Comments: Normal Muscle Bulk and Muscle Testing Reveals:  Upper Extremities: Full ROM and Muscle Strength 5/5 Left AC Joint Tenderness  Thoracic Hypersensitivity: T-1-T-7 Lower Extremities: Full ROM and Muscle Strength 4/5 Arises from Table Slowly Antalgic Gait   Skin:    General: Skin is warm and dry.  Neurological:     Mental Status: She is alert and oriented to person, place, and time.  Psychiatric:        Mood and Affect: Mood normal.        Behavior: Behavior normal.           Assessment & Plan:  1.Complex Regional Pain Syndrome Type 1: Continue Topamax and Cymbalta.12/12/2019. Refilled:Hydro-codone 7.5/325mg one tablet twice a day one in the morning and one at bedtime#60. Continue:Tramadol 100 mg ER daily #30 and Tramadol 50 mg one tablet by mouth BID. One tablet in the afternoon and one tablet in the evening. We will continue the opioid monitoring program, this consists of regular clinic visits, examinations, urine drug screen, pill counts as well as use of [place] Controlled Substance Reporting System. 2. Depression: Continuecurrent medication regimen withCymbalta.12/12/2019 3. Insomnia: Continuecurrent medication regimen withTrazodone50 mg HS.12/12/2019 4. Muscle Spasms: Continuecurrent medication regimen  withTizanidine.12/12/2019 5. Chronic Pain Syndrome: Continue IbuprofenBID as needed.12/12/2019  F/U in 1 month   67mnutes of face to face patient care time was spent during this visit. All questions were encouraged and answered.

## 2019-12-12 NOTE — Patient Instructions (Signed)
COVID-19 Vaccine Information can be found at: [email or web address] For questions related to vaccine distribution or appointments, please email [email or web address] or call [phone].    

## 2019-12-13 ENCOUNTER — Telehealth: Payer: Self-pay

## 2019-12-13 NOTE — Telephone Encounter (Signed)
[name] with [facility]  [phone] called need paperwork for prior auth completed with [place] work comp for her Tizanidine - they cannot fill

## 2019-12-13 NOTE — Telephone Encounter (Signed)
Placed a call to [name], no answer. Left message to return the call.

## 2019-12-14 ENCOUNTER — Telehealth: Payer: Self-pay | Admitting: Registered Nurse

## 2019-12-14 MED ORDER — TIZANIDINE HCL 4 MG PO TABS: ORAL_TABLET | ORAL | 0 refills | Status: DC

## 2019-12-14 NOTE — Telephone Encounter (Signed)
Placed a call to [name], she verbalizes understanding regarding the tizanidine. Also states her tizanidine is being covered by her insurance not workman's compensation.

## 2019-12-14 NOTE — Telephone Encounter (Signed)
Tizanidine ordered. [name] is aware of the above.

## 2020-01-09 ENCOUNTER — Encounter: Payer: Worker's Compensation | Attending: Physical Medicine & Rehabilitation | Admitting: Registered Nurse

## 2020-01-09 ENCOUNTER — Other Ambulatory Visit: Payer: Self-pay

## 2020-01-09 ENCOUNTER — Encounter: Payer: Self-pay | Admitting: Registered Nurse

## 2020-01-09 VITALS — BP 123/81 | HR 80 | Temp 97.8°F | Ht 67.0 in | Wt 167.8 lb

## 2020-01-09 DIAGNOSIS — G90512 Complex regional pain syndrome I of left upper limb: Secondary | ICD-10-CM | POA: Insufficient documentation

## 2020-01-09 DIAGNOSIS — Z79899 Other long term (current) drug therapy: Secondary | ICD-10-CM | POA: Diagnosis present

## 2020-01-09 DIAGNOSIS — Z5181 Encounter for therapeutic drug level monitoring: Secondary | ICD-10-CM | POA: Diagnosis present

## 2020-01-09 DIAGNOSIS — F3289 Other specified depressive episodes: Secondary | ICD-10-CM | POA: Diagnosis present

## 2020-01-09 DIAGNOSIS — M62838 Other muscle spasm: Secondary | ICD-10-CM | POA: Diagnosis present

## 2020-01-09 DIAGNOSIS — Z79891 Long term (current) use of opiate analgesic: Secondary | ICD-10-CM

## 2020-01-09 DIAGNOSIS — G4700 Insomnia, unspecified: Secondary | ICD-10-CM | POA: Insufficient documentation

## 2020-01-09 DIAGNOSIS — G894 Chronic pain syndrome: Secondary | ICD-10-CM | POA: Diagnosis present

## 2020-01-09 MED ORDER — TIZANIDINE HCL 4 MG PO TABS: ORAL_TABLET | ORAL | 3 refills | Status: DC

## 2020-01-09 MED ORDER — TRAZODONE HCL 50 MG PO TABS: ORAL_TABLET | ORAL | 3 refills | Status: DC

## 2020-01-09 MED ORDER — HYDROCODONE-ACETAMINOPHEN 7.5-325 MG PO TABS: ORAL_TABLET | ORAL | 0 refills | Status: DC

## 2020-01-09 MED ORDER — TRAMADOL HCL 50 MG PO TABS: ORAL_TABLET | ORAL | 2 refills | Status: DC

## 2020-01-09 MED ORDER — TRAMADOL HCL ER 100 MG PO TB24: 100.0000 mg | ORAL_TABLET | Freq: Every day | ORAL | 2 refills | Status: DC

## 2020-01-09 MED ORDER — TOPIRAMATE 100 MG PO TABS: ORAL_TABLET | ORAL | 3 refills | Status: DC

## 2020-01-09 NOTE — Progress Notes (Signed)
Subjective:    Patient ID: [name], female    DOB: 03-30-1966, 54 y.o.   MRN: [id number]  HPI: [name] is a 54 y.o. female who returns for follow up appointment for chronic pain and medication refill. She states her pain is located in her left arm. She rates her pain 9. Her current exercise regime is walking and performing stretching exercises.  [name] Morphine equivalent is 35.00 MME.    Oral Swab was Performed Today.   Pain Inventory Average Pain 8 Pain Right Now 9 My pain is sharp, dull, stabbing, tingling and aching  In the last 24 hours, has pain interfered with the following? General activity 4 Relation with others 4 Enjoyment of life 4 What TIME of day is your pain at its worst? all Sleep (in general) Poor  Pain is worse with: walking, bending, sitting and some activites Pain improves with: heat/ice, therapy/exercise, pacing activities, medication and TENS Relief from Meds: 6  Mobility walk without assistance ability to climb steps?  yes do you drive?  yes  Function disabled: date disabled 09/1993  Neuro/Psych bladder control problems weakness numbness tingling spasms depression anxiety  Prior Studies none  Physicians involved in your care none   Family History  Problem Relation Age of Onset  . Cancer Mother        lung  . Hypertension Mother   . Cancer Father        colon  . Hypertension Sister   . Cancer Sister   . Cancer Brother   . Hyperlipidemia Sister   . Birth defects Maternal Grandmother        breast  . Breast cancer Maternal Grandmother   . Birth defects Paternal Grandmother        uterine, stomach, lung  . Breast cancer Paternal Grandmother    Social History   Socioeconomic History  . Marital status: Married    Spouse name: Not on file  . Number of children: 2  . Years of education: Not on file  . Highest education level: Not on file  Occupational History  . Occupation: Disabled  due to RSD  Tobacco Use  . Smoking status: Never Smoker  . Smokeless tobacco: Never Used  Substance and Sexual Activity  . Alcohol use: Yes    Alcohol/week: 0.0 standard drinks    Comment: 01/24/2013 "drink or 2 once or /twice/year, if that"  . Drug use: No  . Sexual activity: Not Currently  Other Topics Concern  . Not on file  Social History Narrative   No living will   Would want husband as POA--then sister, [name]   Would accept resuscitation attempts   Probably would not want tube feeds if cognitively unaware   Social Determinants of Health   Financial Resource Strain:   . Difficulty of Paying Living Expenses:   Food Insecurity:   . Worried About Charity fundraiser in the Last Year:   . Arboriculturist in the Last Year:   Transportation Needs:   . Film/video editor (Medical):   Marland Kitchen Lack of Transportation (Non-Medical):   Physical Activity:   . Days of Exercise per Week:   . Minutes of Exercise per Session:   Stress:   . Feeling of Stress :   Social Connections:   . Frequency of Communication with Friends and Family:   . Frequency of Social Gatherings with Friends and Family:   . Attends Religious Services:   . Active Member of Clubs  or Organizations:   . Attends Archivist Meetings:   Marland Kitchen Marital Status:    Past Surgical History:  Procedure Laterality Date  . APPENDECTOMY  ~ 06/2007  . BLADDER REPAIR  ~ 06/2007   "same day after bladder lift" (01/24/2013)  . BLADDER SUSPENSION  ~ 06/2007  . BREAST BIOPSY Left   . DIAGNOSTIC LAPAROSCOPY  1990's & ~ 2000   "I've had a couple; for endometrosis" (01/24/2013)  . HERNIA REPAIR    . INCISIONAL HERNIA REPAIR N/A 01/24/2013   Procedure: LAPAROSCOPIC INCISIONAL HERNIA;  Surgeon: Harl Bowie, MD;  Location: [facility];  Service: General;  Laterality: N/A;  . INSERTION OF MESH N/A 01/24/2013   Procedure: INSERTION OF MESH;  Surgeon: Harl Bowie, MD;  Location: [facility];  Service: General;  Laterality: N/A;  .  LAPAROSCOPIC INCISIONAL / UMBILICAL / [place]  01/24/2013   IHR w/mesh/notes  . NASAL SEPTUM SURGERY  1980's?  . OOPHORECTOMY Right 2009  . TONSILLECTOMY  1990's  . VAGINAL HYSTERECTOMY  ` 06/2007   Past Medical History:  Diagnosis Date  . Allergy   . Anxiety   . Arthritis    "just the norm" (01/24/2013)  . ASCVD (arteriosclerotic cardiovascular disease)   . Asthma   . Celiac disease   . Daily headache    "depends on the season" (01/24/2013)  . Fibromyalgia   . GERD (gastroesophageal reflux disease)   . History of colonic polyps   . Hyperlipidemia   . Hypertension   . Migraines   . Obesity (BMI 30.0-34.9)   . RSD (reflex sympathetic dystrophy)   . Sleep apnea    NO MACHINE RECOMMENDED  . TIA (transient ischemic attack) ~ 2009   Temp 97.8 F (36.6 C)   Ht 5' 7"  (1.702 m)   Wt 167 lb 12.8 oz (76.1 kg)   BMI 26.28 kg/m   Opioid Risk Score:   Fall Risk Score:  `1  Depression screen PHQ 2/9  Depression screen [facility] 2/9 02/26/2019 11/29/2018 09/07/2018 05/09/2018 02/06/2018 01/09/2018 10/04/2017  Decreased Interest 1 1 1 1 1 1 1   Down, Depressed, Hopeless 1 1 1 1 1 1 1   PHQ - 2 Score 2 2 2 2 2 2 2   Altered sleeping - - - - 0 - -  Tired, decreased energy - - - - 1 - -  Change in appetite - - - - 0 - -  Feeling bad or failure about yourself  - - - - 0 - -  Trouble concentrating - - - - 0 - -  Moving slowly or fidgety/restless - - - - 0 - -  Suicidal thoughts - - - - 0 - -  PHQ-9 Score - - - - 3 - -  Difficult doing work/chores - - - - Not difficult at all - -  Some recent data might be hidden    Review of Systems  All other systems reviewed and are negative.      Objective:   Physical Exam Vitals and nursing note reviewed.  Constitutional:      Appearance: Normal appearance.  Cardiovascular:     Rate and Rhythm: Normal rate and regular rhythm.     Pulses: Normal pulses.     Heart sounds: Normal heart sounds.  Pulmonary:     Effort: Pulmonary effort is  normal.     Breath sounds: Normal breath sounds.  Musculoskeletal:     Cervical back: Normal range of motion and  neck supple.     Comments: Normal Muscle Bulk and Muscle Testing Reveals:  Upper Extremities: Right: Full ROM and Muscle Strength 5/5 Left: Decreased ROM 90 Degrees and Muscle Strength 4/5 Lower Extremities: Decreased ROM and Muscle Strength 4/5  Bilateral Lower Extremities Flexion Produces Pain into her bilateral shins and bilateral feet  Arises from Table slowly Antalgic Gait   Skin:    General: Skin is warm and dry.  Neurological:     Mental Status: She is alert and oriented to person, place, and time.  Psychiatric:        Mood and Affect: Mood normal.        Behavior: Behavior normal.           Assessment & Plan:  1.Complex Regional Pain Syndrome Type 1: Continue Topamax and Cymbalta.01/09/2020. Refilled:Hydro-codone 7.5/325mg one tablet twice a day one in the morning and one at bedtime#60. Continue:Tramadol 100 mg ER daily #30 and Tramadol 50 mg one tablet by mouth BID. One tablet in the afternoon and one tablet in the evening. We will continue the opioid monitoring program, this consists of regular clinic visits, examinations, urine drug screen, pill counts as well as use of [place] Controlled Substance Reporting System. 2. Depression: Continuecurrent medication regimen withCymbalta.01/09/2020 3. Insomnia: Continuecurrent medication regimen withTrazodone50 mg HS.01/09/2020 4. Muscle Spasms: Continuecurrent medication regimen withTizanidine.01/09/2020 5. Chronic Pain Syndrome: Continue IbuprofenBID as needed.01/09/2020  F/U in 1 month   69mnutes of face to face patient care time was spent during this visit. All questions were encouraged and answered.

## 2020-01-13 LAB — DRUG TOX MONITOR 1 W/CONF, ORAL FLD
Alprazolam: NEGATIVE ng/mL (ref <0.50)
Amphetamines: NEGATIVE ng/mL (ref <10)
Barbiturates: NEGATIVE ng/mL (ref <10)
Benzodiazepines: NEGATIVE ng/mL (ref <0.50)
Buprenorphine: NEGATIVE ng/mL (ref <0.10)
Clonazepam: NEGATIVE ng/mL (ref <0.50)
Cocaine: NEGATIVE ng/mL (ref <5.0)
Codeine: NEGATIVE ng/mL (ref <2.5)
Diazepam: NEGATIVE ng/mL (ref <0.50)
Dihydrocodeine: 4.7 ng/mL — ABNORMAL HIGH (ref <2.5)
Fentanyl: NEGATIVE ng/mL (ref <0.10)
Flunitrazepam: NEGATIVE ng/mL (ref <0.50)
Flurazepam: NEGATIVE ng/mL (ref <0.50)
Heroin Metabolite: NEGATIVE ng/mL (ref <1.0)
Hydrocodone: 78.7 ng/mL — ABNORMAL HIGH (ref <2.5)
Hydromorphone: NEGATIVE ng/mL (ref <2.5)
Lorazepam: NEGATIVE ng/mL (ref <0.50)
MARIJUANA: NEGATIVE ng/mL (ref <2.5)
MDMA: NEGATIVE ng/mL (ref <10)
Meprobamate: NEGATIVE ng/mL (ref <2.5)
Methadone: NEGATIVE ng/mL (ref <5.0)
Midazolam: NEGATIVE ng/mL (ref <0.50)
Morphine: NEGATIVE ng/mL (ref <2.5)
Nicotine Metabolite: NEGATIVE ng/mL (ref <5.0)
Nordiazepam: NEGATIVE ng/mL (ref <0.50)
Norhydrocodone: 9.9 ng/mL — ABNORMAL HIGH (ref <2.5)
Noroxycodone: NEGATIVE ng/mL (ref <2.5)
Opiates: POSITIVE ng/mL — AB (ref <2.5)
Oxazepam: NEGATIVE ng/mL (ref <0.50)
Oxycodone: NEGATIVE ng/mL (ref <2.5)
Oxymorphone: NEGATIVE ng/mL (ref <2.5)
Phencyclidine: NEGATIVE ng/mL (ref <10)
Tapentadol: NEGATIVE ng/mL (ref <5.0)
Temazepam: NEGATIVE ng/mL (ref <0.50)
Tramadol: >500 ng/mL — ABNORMAL HIGH (ref <5.0)
Tramadol: POSITIVE ng/mL — AB (ref <5.0)
Triazolam: NEGATIVE ng/mL (ref <0.50)
Zolpidem: NEGATIVE ng/mL (ref <5.0)

## 2020-01-13 LAB — DRUG TOX ALC METAB W/CON, ORAL FLD: Alcohol Metabolite: NEGATIVE ng/mL (ref <25)

## 2020-01-22 ENCOUNTER — Telehealth: Payer: Self-pay

## 2020-01-22 NOTE — Telephone Encounter (Signed)
UDS RESULTS CONSISTENT WITH MEDICATIONS ON FILE  

## 2020-02-15 ENCOUNTER — Encounter: Payer: Worker's Compensation | Attending: Physical Medicine & Rehabilitation | Admitting: Registered Nurse

## 2020-02-15 ENCOUNTER — Encounter: Payer: Self-pay | Admitting: Registered Nurse

## 2020-02-15 ENCOUNTER — Other Ambulatory Visit: Payer: Self-pay

## 2020-02-15 VITALS — BP 119/78 | HR 83 | Temp 98.7°F | Ht 67.0 in | Wt 167.0 lb

## 2020-02-15 DIAGNOSIS — Z79899 Other long term (current) drug therapy: Secondary | ICD-10-CM | POA: Insufficient documentation

## 2020-02-15 DIAGNOSIS — F3289 Other specified depressive episodes: Secondary | ICD-10-CM | POA: Diagnosis present

## 2020-02-15 DIAGNOSIS — M62838 Other muscle spasm: Secondary | ICD-10-CM | POA: Diagnosis present

## 2020-02-15 DIAGNOSIS — Z5181 Encounter for therapeutic drug level monitoring: Secondary | ICD-10-CM | POA: Diagnosis present

## 2020-02-15 DIAGNOSIS — Z79891 Long term (current) use of opiate analgesic: Secondary | ICD-10-CM | POA: Diagnosis present

## 2020-02-15 DIAGNOSIS — G90512 Complex regional pain syndrome I of left upper limb: Secondary | ICD-10-CM | POA: Diagnosis present

## 2020-02-15 DIAGNOSIS — G894 Chronic pain syndrome: Secondary | ICD-10-CM | POA: Insufficient documentation

## 2020-02-15 DIAGNOSIS — G4700 Insomnia, unspecified: Secondary | ICD-10-CM | POA: Insufficient documentation

## 2020-02-15 MED ORDER — HYDROCODONE-ACETAMINOPHEN 7.5-325 MG PO TABS: ORAL_TABLET | ORAL | 0 refills | Status: DC

## 2020-02-15 MED ORDER — TRAMADOL HCL 50 MG PO TABS: ORAL_TABLET | ORAL | 2 refills | Status: DC

## 2020-02-15 MED ORDER — TRAMADOL HCL ER 100 MG PO TB24: 100.0000 mg | ORAL_TABLET | Freq: Every day | ORAL | 2 refills | Status: DC

## 2020-02-15 NOTE — Progress Notes (Signed)
Subjective:    Patient ID: [name], female    DOB: Oct 14, 1966, 54 y.o.   MRN: [id number]  HPI: [name] is a 54 y.o. female who returns for follow up appointment for chronic pain and medication refill. She states her pain is located in her left arm and left wrist. She rates her pain 5. Her current exercise regime is walking.   [name] reports two deaths in her family over the last two weeks, emotional support given.   [name] Morphine equivalent is 35.00  MME.  Last Oral Swab was Performed on 01/09/2020, it was consistent.   Pain Inventory Average Pain 8 Pain Right Now 5 My pain is sharp, burning, dull, stabbing, tingling and aching  In the last 24 hours, has pain interfered with the following? General activity 4 Relation with others 4 Enjoyment of life 4 What TIME of day is your pain at its worst? all Sleep (in general) Poor  Pain is worse with: walking, bending, sitting, inactivity, standing and some activites Pain improves with: rest, heat/ice, therapy/exercise, pacing activities, medication and TENS Relief from Meds: 6  Mobility ability to climb steps?  yes do you drive?  yes  Function disabled: date disabled 09/1993 I need assistance with the following:  meal prep, household duties and shopping  Neuro/Psych bladder control problems weakness numbness tremor tingling trouble walking spasms dizziness depression anxiety  Prior Studies Any changes since last visit?  no  Physicians involved in your care Any changes since last visit?  no   Family History  Problem Relation Age of Onset  . Cancer Mother        lung  . Hypertension Mother   . Cancer Father        colon  . Hypertension Sister   . Cancer Sister   . Cancer Brother   . Hyperlipidemia Sister   . Birth defects Maternal Grandmother        breast  . Breast cancer Maternal Grandmother   . Birth defects Paternal Grandmother        uterine, stomach, lung    . Breast cancer Paternal Grandmother    Social History   Socioeconomic History  . Marital status: Married    Spouse name: Not on file  . Number of children: 2  . Years of education: Not on file  . Highest education level: Not on file  Occupational History  . 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 Smoking status: Never Smoker  . Smokeless tobacco: Never Used  Substance and Sexual Activity  . Alcohol use: Yes    Alcohol/week: 0.0 standard drinks    Comment: 01/24/2013 "drink or 2 once or /twice/year, if that"  . Drug use: No  . Sexual activity: Not Currently  Other Topics Concern  . Not on file  Social History Narrative   No living will   Would want husband as POA--then sister, Neoma Laming   Would accept resuscitation attempts   Probably would not want tube feeds if cognitively unaware   Social Determinants of Health   Financial Resource Strain:   . Difficulty of Paying Living Expenses:   Food Insecurity:   . Worried About Charity fundraiser in the Last Year:   . Arboriculturist in the Last Year:   Transportation Needs:   . Film/video editor (Medical):   Marland Kitchen Lack of Transportation (Non-Medical):   Physical Activity:   . Days of Exercise per Week:   .  Minutes of Exercise per Session:   Stress:   . Feeling of Stress :   Social Connections:   . Frequency of Communication with Friends and Family:   . Frequency of Social Gatherings with Friends and Family:   . Attends Religious Services:   . Active Member of Clubs or Organizations:   . Attends Archivist Meetings:   Marland Kitchen Marital Status:    Past Surgical History:  Procedure Laterality Date  . APPENDECTOMY  ~ 06/2007  . BLADDER REPAIR  ~ 06/2007   "same day after bladder lift" (01/24/2013)  . BLADDER SUSPENSION  ~ 06/2007  . BREAST BIOPSY Left   . DIAGNOSTIC LAPAROSCOPY  1990's & ~ 2000   "I've had a couple; for endometrosis" (01/24/2013)  . HERNIA REPAIR    . INCISIONAL HERNIA REPAIR N/A 01/24/2013    Procedure: LAPAROSCOPIC INCISIONAL HERNIA;  Surgeon: Harl Bowie, MD;  Location: [place];  Service: General;  Laterality: N/A;  . INSERTION OF MESH N/A 01/24/2013   Procedure: INSERTION OF MESH;  Surgeon: Harl Bowie, MD;  Location: [place];  Service: General;  Laterality: N/A;  . LAPAROSCOPIC INCISIONAL / UMBILICAL / [place]  01/24/2013   IHR w/mesh/notes  . NASAL SEPTUM SURGERY  1980's?  . OOPHORECTOMY Right 2009  . TONSILLECTOMY  1990's  . VAGINAL HYSTERECTOMY  ` 06/2007   Past Medical History:  Diagnosis Date  . Allergy   . Anxiety   . Arthritis    "just the norm" (01/24/2013)  . ASCVD (arteriosclerotic cardiovascular disease)   . Asthma   . Celiac disease   . Daily headache    "depends on the season" (01/24/2013)  . Fibromyalgia   . GERD (gastroesophageal reflux disease)   . History of colonic polyps   . Hyperlipidemia   . Hypertension   . Migraines   . Obesity (BMI 30.0-34.9)   . RSD (reflex sympathetic dystrophy)   . Sleep apnea    NO MACHINE RECOMMENDED  . TIA (transient ischemic attack) ~ 2009   BP 119/78   Pulse 83   Temp 98.7 F (37.1 C)   Ht 5' 7"  (1.702 m)   Wt 167 lb (75.8 kg)   SpO2 95%   BMI 26.16 kg/m   Opioid Risk Score:   Fall Risk Score:  `1  Depression screen PHQ 2/9  Depression screen [facility] 2/9 02/26/2019 11/29/2018 09/07/2018 05/09/2018 02/06/2018 01/09/2018 10/04/2017  Decreased Interest 1 1 1 1 1 1 1   Down, Depressed, Hopeless 1 1 1 1 1 1 1   PHQ - 2 Score 2 2 2 2 2 2 2   Altered sleeping - - - - 0 - -  Tired, decreased energy - - - - 1 - -  Change in appetite - - - - 0 - -  Feeling bad or failure about yourself  - - - - 0 - -  Trouble concentrating - - - - 0 - -  Moving slowly or fidgety/restless - - - - 0 - -  Suicidal thoughts - - - - 0 - -  PHQ-9 Score - - - - 3 - -  Difficult doing work/chores - - - - Not difficult at all - -  Some recent data might be hidden   Review of Systems  Neurological: Positive for dizziness,  tremors, weakness and numbness.       Tingling and spasms  Psychiatric/Behavioral: Positive for dysphoric mood. The patient is nervous/anxious.   All other systems reviewed  and are negative.      Objective:   Physical Exam Vitals and nursing note reviewed.  Constitutional:      Appearance: Normal appearance.  Cardiovascular:     Rate and Rhythm: Normal rate and regular rhythm.     Pulses: Normal pulses.     Heart sounds: Normal heart sounds.  Pulmonary:     Effort: Pulmonary effort is normal.     Breath sounds: Normal breath sounds.  Musculoskeletal:     Cervical back: Normal range of motion and neck supple.     Comments: Normal Muscle Bulk and Muscle Testing Reveals:  Upper Extremities: Right: Full ROM and Muscle Strength 5/5 Left: Decreased ROM 90 Degrees and Muscle Strength 3/5  Left AC Joint Tenderness Thoracic Hypersensitivity: T-1-T-3  Lower Extremities: Full ROM and Muscle Strength 4/5 Bilateral Lower Extremity Flexion Produces Pain into her Lower Extremities and bilateral feet Arises from Table slowly Antalgic  Gait   Skin:    General: Skin is warm and dry.  Neurological:     Mental Status: She is alert and oriented to person, place, and time.  Psychiatric:        Mood and Affect: Mood normal.        Behavior: Behavior normal.           Assessment & Plan:  1.Complex Regional Pain Syndrome Type 1: Continue Topamax and Cymbalta.02/15/2020. Refilled:Hydro-codone 7.5/325mg one tablet twice a day one in the morning and one at bedtime#60. Continue Tramadol 100 mg ER daily #30 and Tramadol 50 mg one tablet by mouth BID. One tablet in the afternoon and one tablet in the evening. We will continue the opioid monitoring program, this consists of regular clinic visits, examinations, urine drug screen, pill counts as well as use of [place] Controlled Substance Reporting System. 2. Depression: Continuecurrent medication regimen withCymbalta.02/15/2020 3.  Insomnia: Continuecurrent medication regimen withTrazodone50 mg HS.02/15/2020 4. Muscle Spasms: Continuecurrent medication regimen withTizanidine.02/15/2020 5. Chronic Pain Syndrome: Continue IbuprofenBID as needed.02/15/2020  F/U in 1 month   53mnutes of face to face patient care time was spent during this visit. All questions were encouraged and answered.

## 2020-03-02 ENCOUNTER — Other Ambulatory Visit: Payer: Self-pay | Admitting: Registered Nurse

## 2020-03-13 ENCOUNTER — Other Ambulatory Visit: Payer: Self-pay

## 2020-03-13 ENCOUNTER — Encounter: Payer: Worker's Compensation | Attending: Registered Nurse | Admitting: Registered Nurse

## 2020-03-13 VITALS — BP 106/67 | HR 87 | Temp 97.5°F | Ht 67.0 in | Wt 167.0 lb

## 2020-03-13 DIAGNOSIS — F3289 Other specified depressive episodes: Secondary | ICD-10-CM | POA: Insufficient documentation

## 2020-03-13 DIAGNOSIS — M62838 Other muscle spasm: Secondary | ICD-10-CM | POA: Diagnosis present

## 2020-03-13 DIAGNOSIS — G90512 Complex regional pain syndrome I of left upper limb: Secondary | ICD-10-CM | POA: Insufficient documentation

## 2020-03-13 DIAGNOSIS — G4700 Insomnia, unspecified: Secondary | ICD-10-CM | POA: Diagnosis present

## 2020-03-13 DIAGNOSIS — Z5181 Encounter for therapeutic drug level monitoring: Secondary | ICD-10-CM | POA: Diagnosis present

## 2020-03-13 DIAGNOSIS — Z79891 Long term (current) use of opiate analgesic: Secondary | ICD-10-CM | POA: Insufficient documentation

## 2020-03-13 DIAGNOSIS — G894 Chronic pain syndrome: Secondary | ICD-10-CM | POA: Diagnosis present

## 2020-03-13 MED ORDER — TRAMADOL HCL ER 100 MG PO TB24: 100.0000 mg | ORAL_TABLET | Freq: Every day | ORAL | 2 refills | Status: DC

## 2020-03-13 MED ORDER — TRAMADOL HCL 50 MG PO TABS: ORAL_TABLET | ORAL | 2 refills | Status: DC

## 2020-03-13 MED ORDER — HYDROCODONE-ACETAMINOPHEN 7.5-325 MG PO TABS: ORAL_TABLET | ORAL | 0 refills | Status: DC

## 2020-03-13 NOTE — Progress Notes (Signed)
Subjective:    Patient ID: [name], female    DOB: 01-Sep-1966, 54 y.o.   MRN: [id number]  HPI: [name] is a 54 y.o. female who returns for follow up appointment for chronic pain and medication refill. She states her pain is located in her left arm. She rates her pain 7. Her current exercise regime is walking.   [name] Morphine equivalent is 35.00 MME.  Last Oral Swab was Performed on 01/09/2020, it was consistent.     Pain Inventory Average Pain 9 Pain Right Now 7 My pain is sharp, burning, dull, stabbing, tingling and aching  In the last 24 hours, has pain interfered with the following? General activity 5 Relation with others 5 Enjoyment of life 5 What TIME of day is your pain at its worst? all Sleep (in general) Poor  Pain is worse with: walking, bending, sitting, standing and unsure Pain improves with: rest, heat/ice, therapy/exercise, pacing activities, medication and TENS Relief from Meds: 6  Mobility ability to climb steps?  yes do you drive?  yes  Function disabled: date disabled 09/1993 I need assistance with the following:  meal prep, household duties and shopping  Neuro/Psych bladder control problems weakness numbness tremor tingling trouble walking spasms dizziness depression anxiety  Prior Studies Any changes since last visit?  no  Physicians involved in your care Any changes since last visit?  no   Family History  Problem Relation Age of Onset  . Cancer Mother        lung  . Hypertension Mother   . Cancer Father        colon  . Hypertension Sister   . Cancer Sister   . Cancer Brother   . Hyperlipidemia Sister   . Birth defects Maternal Grandmother        breast  . Breast cancer Maternal Grandmother   . Birth defects Paternal Grandmother        uterine, stomach, lung  . Breast cancer Paternal Grandmother    Social History   Socioeconomic History  . Marital status: Married    Spouse  name: Not on file  . Number of children: 2  . Years of education: Not on file  . Highest education level: Not on file  Occupational History  . 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 Smoking status: Never Smoker  . Smokeless tobacco: Never Used  Substance and Sexual Activity  . Alcohol use: Yes    Alcohol/week: 0.0 standard drinks    Comment: 01/24/2013 "drink or 2 once or /twice/year, if that"  . Drug use: No  . Sexual activity: Not Currently  Other Topics Concern  . Not on file  Social History Narrative   No living will   Would want husband as POA--then sister, [name]   Would accept resuscitation attempts   Probably would not want tube feeds if cognitively unaware   Social Determinants of Health   Financial Resource Strain:   . Difficulty of Paying Living Expenses:   Food Insecurity:   . Worried About Charity fundraiser in the Last Year:   . Arboriculturist in the Last Year:   Transportation Needs:   . Film/video editor (Medical):   Marland Kitchen Lack of Transportation (Non-Medical):   Physical Activity:   . Days of Exercise per Week:   . Minutes of Exercise per Session:   Stress:   . Feeling of Stress :   Social Connections:   . Frequency  of Communication with Friends and Family:   . Frequency of Social Gatherings with Friends and Family:   . Attends Religious Services:   . Active Member of Clubs or Organizations:   . Attends Archivist Meetings:   Marland Kitchen Marital Status:    Past Surgical History:  Procedure Laterality Date  . APPENDECTOMY  ~ 06/2007  . BLADDER REPAIR  ~ 06/2007   "same day after bladder lift" (01/24/2013)  . BLADDER SUSPENSION  ~ 06/2007  . BREAST BIOPSY Left   . DIAGNOSTIC LAPAROSCOPY  1990's & ~ 2000   "I've had a couple; for endometrosis" (01/24/2013)  . HERNIA REPAIR    . INCISIONAL HERNIA REPAIR N/A 01/24/2013   Procedure: LAPAROSCOPIC INCISIONAL HERNIA;  Surgeon: Harl Bowie, MD;  Location: [place];  Service: General;  Laterality:  N/A;  . INSERTION OF MESH N/A 01/24/2013   Procedure: INSERTION OF MESH;  Surgeon: Harl Bowie, MD;  Location: [place];  Service: General;  Laterality: N/A;  . LAPAROSCOPIC INCISIONAL / UMBILICAL / [place]  01/24/2013   IHR w/mesh/notes  . NASAL SEPTUM SURGERY  1980's?  . OOPHORECTOMY Right 2009  . TONSILLECTOMY  1990's  . VAGINAL HYSTERECTOMY  ` 06/2007   Past Medical History:  Diagnosis Date  . Allergy   . Anxiety   . Arthritis    "just the norm" (01/24/2013)  . ASCVD (arteriosclerotic cardiovascular disease)   . Asthma   . Celiac disease   . Daily headache    "depends on the season" (01/24/2013)  . Fibromyalgia   . GERD (gastroesophageal reflux disease)   . History of colonic polyps   . Hyperlipidemia   . Hypertension   . Migraines   . Obesity (BMI 30.0-34.9)   . RSD (reflex sympathetic dystrophy)   . Sleep apnea    NO MACHINE RECOMMENDED  . TIA (transient ischemic attack) ~ 2009   BP 106/67   Pulse 87   Temp (!) 97.5 F (36.4 C)   Ht 5' 7"  (1.702 m)   Wt 167 lb (75.8 kg)   SpO2 96%   BMI 26.16 kg/m   Opioid Risk Score:   Fall Risk Score:  `1  Depression screen PHQ 2/9  Depression screen [facility] 2/9 02/26/2019 11/29/2018 09/07/2018 05/09/2018 02/06/2018 01/09/2018 10/04/2017  Decreased Interest 1 1 1 1 1 1 1   Down, Depressed, Hopeless 1 1 1 1 1 1 1   PHQ - 2 Score 2 2 2 2 2 2 2   Altered sleeping - - - - 0 - -  Tired, decreased energy - - - - 1 - -  Change in appetite - - - - 0 - -  Feeling bad or failure about yourself  - - - - 0 - -  Trouble concentrating - - - - 0 - -  Moving slowly or fidgety/restless - - - - 0 - -  Suicidal thoughts - - - - 0 - -  PHQ-9 Score - - - - 3 - -  Difficult doing work/chores - - - - Not difficult at all - -  Some recent data might be hidden    Review of Systems  Neurological: Positive for dizziness, tremors, weakness and numbness.  Psychiatric/Behavioral: Positive for dysphoric mood. The patient is nervous/anxious.     All other systems reviewed and are negative.      Objective:   Physical Exam Vitals and nursing note reviewed.  Constitutional:      Appearance: Normal appearance.  Cardiovascular:     Rate and Rhythm: Normal rate and regular rhythm.     Pulses: Normal pulses.     Heart sounds: Normal heart sounds.  Pulmonary:     Effort: Pulmonary effort is normal.     Breath sounds: Normal breath sounds.  Musculoskeletal:     Cervical back: Normal range of motion and neck supple.     Comments: Normal Muscle Bulk and Muscle Testing Reveals:  Upper Extremities: Right: Full ROM and Muscle Strength 4/5 Left Upper Extremity: Decreased ROM 45 Degrees and Muscle Strength 3/5 Bilateral AC Joint Tenderness L>R Lower Extremities: Full ROM and Muscle Strength 5/5 Arises from Table Slowly Narrow Based  Gait   Skin:    General: Skin is warm and dry.  Neurological:     Mental Status: She is alert and oriented to person, place, and time.  Psychiatric:        Mood and Affect: Mood normal.        Behavior: Behavior normal.           Assessment & Plan:  1.Complex Regional Pain Syndrome Type 1: Continue Topamax and Cymbalta.03/13/2020. Refilled:Hydro-codone 7.5/325mg one tablet twice a day one in the morning and one at bedtime#60. Continue Tramadol 100 mg ER daily #30 and Tramadol 50 mg one tablet by mouth BID. One tablet in the afternoon and one tablet in the evening. We will continue the opioid monitoring program, this consists of regular clinic visits, examinations, urine drug screen, pill counts as well as use of [place] Controlled Substance Reporting System. 2. Depression: Continuecurrent medication regimen withCymbalta.03/13/2020 3. Insomnia: Continuecurrent medication regimen withTrazodone50 mg HS.03/13/2020 4. Muscle Spasms: Continuecurrent medication regimen withTizanidine.03/13/2020 5. Chronic Pain Syndrome: Continue IbuprofenBID as needed.03/13/2020  F/U in 1 month    19mnutes of face to face patient care time was spent during this visit. All questions were encouraged and answered.

## 2020-03-19 ENCOUNTER — Encounter: Payer: Self-pay | Admitting: Registered Nurse

## 2020-03-22 ENCOUNTER — Other Ambulatory Visit: Payer: Self-pay | Admitting: Registered Nurse

## 2020-04-14 ENCOUNTER — Encounter: Payer: Worker's Compensation | Attending: Registered Nurse | Admitting: Registered Nurse

## 2020-04-14 ENCOUNTER — Encounter: Payer: Self-pay | Admitting: Registered Nurse

## 2020-04-14 ENCOUNTER — Other Ambulatory Visit: Payer: Self-pay

## 2020-04-14 VITALS — BP 123/81 | HR 78 | Temp 97.0°F | Ht 67.0 in | Wt 169.8 lb

## 2020-04-14 DIAGNOSIS — G4700 Insomnia, unspecified: Secondary | ICD-10-CM | POA: Diagnosis present

## 2020-04-14 DIAGNOSIS — Z5181 Encounter for therapeutic drug level monitoring: Secondary | ICD-10-CM

## 2020-04-14 DIAGNOSIS — G90512 Complex regional pain syndrome I of left upper limb: Secondary | ICD-10-CM | POA: Diagnosis present

## 2020-04-14 DIAGNOSIS — Z79891 Long term (current) use of opiate analgesic: Secondary | ICD-10-CM

## 2020-04-14 DIAGNOSIS — F3289 Other specified depressive episodes: Secondary | ICD-10-CM

## 2020-04-14 DIAGNOSIS — G894 Chronic pain syndrome: Secondary | ICD-10-CM

## 2020-04-14 DIAGNOSIS — M62838 Other muscle spasm: Secondary | ICD-10-CM

## 2020-04-14 MED ORDER — TRAZODONE HCL 50 MG PO TABS: ORAL_TABLET | ORAL | 3 refills | Status: DC

## 2020-04-14 MED ORDER — HYDROCODONE-ACETAMINOPHEN 7.5-325 MG PO TABS: ORAL_TABLET | ORAL | 0 refills | Status: DC

## 2020-04-14 MED ORDER — CYMBALTA 30 MG PO CPEP: 90.0000 mg | ORAL_CAPSULE | Freq: Every day | ORAL | 2 refills | Status: DC

## 2020-04-14 MED ORDER — TIZANIDINE HCL 4 MG PO TABS: ORAL_TABLET | ORAL | 3 refills | Status: DC

## 2020-04-14 NOTE — Progress Notes (Signed)
Subjective:    Patient ID: [name], female    DOB: September 05, 1966, 54 y.o.   MRN: [id number]  HPI: [name] is a 54 y.o. female who returns for follow up appointment for chronic pain and medication refill. She states her pain is located in her left arm. She rates her pain 8. Her current exercise regime is walking and performing stretching exercises.  [name] equivalent is 35.00 MME.    Last Oral Swab was Performed on 01/09/2020, it was consistent.    Pain Inventory Average Pain 8 Pain Right Now 8 My pain is constant, sharp, burning, dull, stabbing, tingling and aching  In the last 24 hours, has pain interfered with the following? General activity 7 Relation with others 7 Enjoyment of life 7 What TIME of day is your pain at its worst? All the time. Sleep (in general) Poor  Pain is worse with: walking, bending, sitting, inactivity, standing and some activites Pain improves with: rest, heat/ice, therapy/exercise, pacing activities, medication and TENS Relief from Meds: 6  Mobility how many minutes can you walk? 15 mins ability to climb steps?  yes do you drive?  yes Do you have any goals in this area?  yes  Function disabled: date disabled 103 maybe. I need assistance with the following:  household duties and shopping Do you have any goals in this area?  yes  Neuro/Psych bladder control problems numbness tremor tingling trouble walking spasms anxiety  Prior Studies Any changes since last visit?  no  Physicians involved in your care Any changes since last visit?  no   Family History  Problem Relation Age of Onset  . Cancer Mother        lung  . Hypertension Mother   . Cancer Father        colon  . Hypertension Sister   . Cancer Sister   . Cancer Brother   . Hyperlipidemia Sister   . Birth defects Maternal Grandmother        breast  . Breast cancer Maternal Grandmother   . Birth defects Paternal  Grandmother        uterine, stomach, lung  . Breast cancer Paternal Grandmother    Social History   Socioeconomic History  . Marital status: Married    Spouse name: Not on file  . Number of children: 2  . Years of education: Not on file  . Highest education level: Not on file  Occupational History  . 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 Smoking status: Never Smoker  . Smokeless tobacco: Never Used  Substance and Sexual Activity  . Alcohol use: Yes    Alcohol/week: 0.0 standard drinks    Comment: 01/24/2013 "drink or 2 once or /twice/year, if that"  . Drug use: No  . Sexual activity: Not Currently  Other Topics Concern  . Not on file  Social History Narrative   No living will   Would want husband as POA--then sister, Neoma Laming   Would accept resuscitation attempts   Probably would not want tube feeds if cognitively unaware   Social Determinants of Health   Financial Resource Strain:   . Difficulty of Paying Living Expenses:   Food Insecurity:   . Worried About Charity fundraiser in the Last Year:   . Arboriculturist in the Last Year:   Transportation Needs:   . Film/video editor (Medical):   Marland Kitchen Lack of Transportation (Non-Medical):   Physical Activity:   .  Days of Exercise per Week:   . Minutes of Exercise per Session:   Stress:   . Feeling of Stress :   Social Connections:   . Frequency of Communication with Friends and Family:   . Frequency of Social Gatherings with Friends and Family:   . Attends Religious Services:   . Active Member of Clubs or Organizations:   . Attends Archivist Meetings:   Marland Kitchen Marital Status:    Past Surgical History:  Procedure Laterality Date  . APPENDECTOMY  ~ 06/2007  . BLADDER REPAIR  ~ 06/2007   "same day after bladder lift" (01/24/2013)  . BLADDER SUSPENSION  ~ 06/2007  . BREAST BIOPSY Left   . DIAGNOSTIC LAPAROSCOPY  1990's & ~ 2000   "I've had a couple; for endometrosis" (01/24/2013)  . HERNIA REPAIR    .  INCISIONAL HERNIA REPAIR N/A 01/24/2013   Procedure: LAPAROSCOPIC INCISIONAL HERNIA;  Surgeon: Harl Bowie, MD;  Location: [place];  Service: General;  Laterality: N/A;  . INSERTION OF MESH N/A 01/24/2013   Procedure: INSERTION OF MESH;  Surgeon: Harl Bowie, MD;  Location: [place];  Service: General;  Laterality: N/A;  . LAPAROSCOPIC INCISIONAL / UMBILICAL / [place]  01/24/2013   IHR w/mesh/notes  . NASAL SEPTUM SURGERY  1980's?  . OOPHORECTOMY Right 2009  . TONSILLECTOMY  1990's  . VAGINAL HYSTERECTOMY  ` 06/2007   Past Medical History:  Diagnosis Date  . Allergy   . Anxiety   . Arthritis    "just the norm" (01/24/2013)  . ASCVD (arteriosclerotic cardiovascular disease)   . Asthma   . Celiac disease   . Daily headache    "depends on the season" (01/24/2013)  . Fibromyalgia   . GERD (gastroesophageal reflux disease)   . History of colonic polyps   . Hyperlipidemia   . Hypertension   . Migraines   . Obesity (BMI 30.0-34.9)   . RSD (reflex sympathetic dystrophy)   . Sleep apnea    NO MACHINE RECOMMENDED  . TIA (transient ischemic attack) ~ 2009   BP 123/81   Pulse 78   Temp (!) 97 F (36.1 C)   Ht 5' 7"  (1.702 m)   Wt 169 lb 12.8 oz (77 kg)   SpO2 95%   BMI 26.59 kg/m   Opioid Risk Score:   Fall Risk Score:  `1  Depression screen PHQ 2/9  Depression screen [facility] 2/9 02/26/2019 11/29/2018 09/07/2018 05/09/2018 02/06/2018 01/09/2018 10/04/2017  Decreased Interest 1 1 1 1 1 1 1   Down, Depressed, Hopeless 1 1 1 1 1 1 1   PHQ - 2 Score 2 2 2 2 2 2 2   Altered sleeping - - - - 0 - -  Tired, decreased energy - - - - 1 - -  Change in appetite - - - - 0 - -  Feeling bad or failure about yourself  - - - - 0 - -  Trouble concentrating - - - - 0 - -  Moving slowly or fidgety/restless - - - - 0 - -  Suicidal thoughts - - - - 0 - -  PHQ-9 Score - - - - 3 - -  Difficult doing work/chores - - - - Not difficult at all - -  Some recent data might be hidden   Review of  Systems  Constitutional: Negative.   HENT: Negative.   Eyes: Negative.   Respiratory: Negative.   Cardiovascular: Negative.   Gastrointestinal: Positive  for constipation and diarrhea.  Endocrine: Negative.   Genitourinary:       Urine leakage  Musculoskeletal: Positive for gait problem.  Skin: Negative.   Allergic/Immunologic: Negative.   Neurological: Positive for dizziness, tremors and weakness.  Hematological: Negative.   Psychiatric/Behavioral:       Anxiety       Objective:   Physical Exam Vitals and nursing note reviewed.  Constitutional:      Appearance: Normal appearance.  Cardiovascular:     Rate and Rhythm: Normal rate and regular rhythm.     Pulses: Normal pulses.     Heart sounds: Normal heart sounds.  Pulmonary:     Effort: Pulmonary effort is normal.     Breath sounds: Normal breath sounds.  Musculoskeletal:     Cervical back: Normal range of motion and neck supple.     Comments: Normal Muscle Bulk and Muscle Testing Reveals:  Upper Extremities: Right: Full ROM and Muscle Strength 5/5 Left: Upper Extremity: Decreased ROM 45 Degrees and Muscle Strength 2/5  Left AC Joint Tenderness  Thoracic Paraspinal Tenderness: T-3-T-7   Lower Extremities: Decreased ROM and Muscle Strength 4/5/ Bilateral Lower Extremity Flexion Produces Pain into her Bilateral Lower Extremity and Bilateral Feet. Arises from Table Slowly Antalgic Gait   Skin:    General: Skin is warm and dry.  Neurological:     Mental Status: She is alert and oriented to person, place, and time.  Psychiatric:        Mood and Affect: Mood normal.        Behavior: Behavior normal.           Assessment & Plan:  1.Complex Regional Pain Syndrome Type 1: Continue Topamax and Cymbalta.04/14/2020. Refilled:Hydro-codone 7.5/325mg one tablet twice a day one in the morning and one at bedtime#60.ContinueTramadol 100 mg ER daily #30 and Tramadol 50 mg one tablet by mouth BID. One tablet in the  afternoon and one tablet in the evening. We will continue the opioid monitoring program, this consists of regular clinic visits, examinations, urine drug screen, pill counts as well as use of [place] Controlled Substance Reporting System. 2. Depression: Continuecurrent medication regimen withCymbalta.04/14/2020 3. Insomnia: Continuecurrent medication regimen withTrazodone50 mg HS.04/14/2020 4. Muscle Spasms: Continuecurrent medication regimen withTizanidine.04/14/2020 5. Chronic Pain Syndrome: Continue IbuprofenBID as needed.04/14/2020  F/U in 1 month   52mnutes of face to face patient care time was spent during this visit. All questions were encouraged and answered.

## 2020-04-25 ENCOUNTER — Other Ambulatory Visit: Payer: Self-pay | Admitting: Internal Medicine

## 2020-04-27 DIAGNOSIS — K2270 Barrett's esophagus without dysplasia: Secondary | ICD-10-CM | POA: Diagnosis not present

## 2020-04-27 DIAGNOSIS — Z86010 Personal history of colonic polyps: Secondary | ICD-10-CM | POA: Diagnosis not present

## 2020-04-27 DIAGNOSIS — Z79899 Other long term (current) drug therapy: Secondary | ICD-10-CM | POA: Diagnosis not present

## 2020-04-27 DIAGNOSIS — K219 Gastro-esophageal reflux disease without esophagitis: Secondary | ICD-10-CM | POA: Diagnosis not present

## 2020-04-27 DIAGNOSIS — Z1159 Encounter for screening for other viral diseases: Secondary | ICD-10-CM | POA: Diagnosis not present

## 2020-05-13 ENCOUNTER — Encounter: Payer: Worker's Compensation | Attending: Registered Nurse | Admitting: Registered Nurse

## 2020-05-13 ENCOUNTER — Other Ambulatory Visit: Payer: Self-pay

## 2020-05-13 ENCOUNTER — Encounter: Payer: Self-pay | Admitting: Registered Nurse

## 2020-05-13 VITALS — BP 127/84 | HR 77 | Temp 98.9°F | Ht 67.0 in | Wt 171.0 lb

## 2020-05-13 DIAGNOSIS — Z79891 Long term (current) use of opiate analgesic: Secondary | ICD-10-CM | POA: Diagnosis present

## 2020-05-13 DIAGNOSIS — G4700 Insomnia, unspecified: Secondary | ICD-10-CM

## 2020-05-13 DIAGNOSIS — Z5181 Encounter for therapeutic drug level monitoring: Secondary | ICD-10-CM

## 2020-05-13 DIAGNOSIS — G894 Chronic pain syndrome: Secondary | ICD-10-CM | POA: Diagnosis present

## 2020-05-13 DIAGNOSIS — F3289 Other specified depressive episodes: Secondary | ICD-10-CM | POA: Diagnosis present

## 2020-05-13 DIAGNOSIS — M62838 Other muscle spasm: Secondary | ICD-10-CM | POA: Diagnosis present

## 2020-05-13 DIAGNOSIS — G90512 Complex regional pain syndrome I of left upper limb: Secondary | ICD-10-CM

## 2020-05-13 MED ORDER — TRAMADOL HCL ER 100 MG PO TB24: 100.0000 mg | ORAL_TABLET | Freq: Every day | ORAL | 2 refills | Status: DC

## 2020-05-13 MED ORDER — TRAMADOL HCL 50 MG PO TABS: ORAL_TABLET | ORAL | 2 refills | Status: DC

## 2020-05-13 MED ORDER — HYDROCODONE-ACETAMINOPHEN 7.5-325 MG PO TABS: ORAL_TABLET | ORAL | 0 refills | Status: DC

## 2020-05-13 NOTE — Progress Notes (Signed)
Subjective:    Patient ID: [name], female    DOB: 1966-01-29, 54 y.o.   MRN: [id number]  HPI: [name] is a 54 y.o. female who returns for follow up appointment for chronic pain and medication refill. She states her pain is located in her left arm. She rates her pain 7. Her current exercise regime is walking and performing stretching exercises.  Ms. Younker Morphine equivalent is 35.00  MME.   Oral Swab was Performed on 01/09/2020, it was consistent.   Pain Inventory Average Pain 8 Pain Right Now 7 My pain is intermittent, constant, sharp, burning, stabbing, tingling and aching  In the last 24 hours, has pain interfered with the following? General activity 6 Relation with others 6 Enjoyment of life 6 What TIME of day is your pain at its worst? all Sleep (in general) Poor  Pain is worse with: walking, bending, sitting, inactivity, standing, unsure and some activites Pain improves with: rest, heat/ice, pacing activities and medication Relief from Meds: 6  Mobility how many minutes can you walk? 15 ability to climb steps?  yes do you drive?  yes  Function disabled: date disabled 19 I need assistance with the following:  household duties and shopping  Neuro/Psych bladder control problems numbness tremor tingling trouble walking spasms anxiety  Prior Studies Any changes since last visit?  no  Physicians involved in your care Any changes since last visit?  no   Family History  Problem Relation Age of Onset  . Cancer Mother        lung  . Hypertension Mother   . Cancer Father        colon  . Hypertension Sister   . Cancer Sister   . Cancer Brother   . Hyperlipidemia Sister   . Birth defects Maternal Grandmother        breast  . Breast cancer Maternal Grandmother   . Birth defects Paternal Grandmother        uterine, stomach, lung  . Breast cancer Paternal Grandmother    Social History   Socioeconomic History  .  Marital status: Married    Spouse name: Not on file  . Number of children: 2  . Years of education: Not on file  . Highest education level: Not on file  Occupational History  . 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 Smoking status: Never Smoker  . Smokeless tobacco: Never Used  Substance and Sexual Activity  . Alcohol use: Yes    Alcohol/week: 0.0 standard drinks    Comment: 01/24/2013 "drink or 2 once or /twice/year, if that"  . Drug use: No  . Sexual activity: Not Currently  Other Topics Concern  . Not on file  Social History Narrative   No living will   Would want husband as POA--then sister, [name]   Would accept resuscitation attempts   Probably would not want tube feeds if cognitively unaware   Social Determinants of Health   Financial Resource Strain:   . Difficulty of Paying Living Expenses:   Food Insecurity:   . Worried About Charity fundraiser in the Last Year:   . Arboriculturist in the Last Year:   Transportation Needs:   . Film/video editor (Medical):   Marland Kitchen Lack of Transportation (Non-Medical):   Physical Activity:   . Days of Exercise per Week:   . Minutes of Exercise per Session:   Stress:   . Feeling of Stress :  Social Connections:   . Frequency of Communication with Friends and Family:   . Frequency of Social Gatherings with Friends and Family:   . Attends Religious Services:   . Active Member of Clubs or Organizations:   . Attends Archivist Meetings:   Marland Kitchen Marital Status:    Past Surgical History:  Procedure Laterality Date  . APPENDECTOMY  ~ 06/2007  . BLADDER REPAIR  ~ 06/2007   "same day after bladder lift" (01/24/2013)  . BLADDER SUSPENSION  ~ 06/2007  . BREAST BIOPSY Left   . DIAGNOSTIC LAPAROSCOPY  1990's & ~ 2000   "I've had a couple; for endometrosis" (01/24/2013)  . HERNIA REPAIR    . INCISIONAL HERNIA REPAIR N/A 01/24/2013   Procedure: LAPAROSCOPIC INCISIONAL HERNIA;  Surgeon: Harl Bowie, MD;  Location: [place];  Service: General;  Laterality: N/A;  . INSERTION OF MESH N/A 01/24/2013   Procedure: INSERTION OF MESH;  Surgeon: Harl Bowie, MD;  Location: [place];  Service: General;  Laterality: N/A;  . LAPAROSCOPIC INCISIONAL / UMBILICAL / [place]  01/24/2013   IHR w/mesh/notes  . NASAL SEPTUM SURGERY  1980's?  . OOPHORECTOMY Right 2009  . TONSILLECTOMY  1990's  . VAGINAL HYSTERECTOMY  ` 06/2007   Past Medical History:  Diagnosis Date  . Allergy   . Anxiety   . Arthritis    "just the norm" (01/24/2013)  . ASCVD (arteriosclerotic cardiovascular disease)   . Asthma   . Celiac disease   . Daily headache    "depends on the season" (01/24/2013)  . Fibromyalgia   . GERD (gastroesophageal reflux disease)   . History of colonic polyps   . Hyperlipidemia   . Hypertension   . Migraines   . Obesity (BMI 30.0-34.9)   . RSD (reflex sympathetic dystrophy)   . Sleep apnea    NO MACHINE RECOMMENDED  . TIA (transient ischemic attack) ~ 2009   BP 127/84   Pulse 77   Temp 98.9 F (37.2 C)   Ht 5' 7"  (1.702 m)   Wt 171 lb (77.6 kg)   SpO2 95%   BMI 26.78 kg/m   Opioid Risk Score:   Fall Risk Score:  `1  Depression screen PHQ 2/9  Depression screen [facility] 2/9 04/14/2020 02/26/2019 11/29/2018 09/07/2018 05/09/2018 02/06/2018 01/09/2018  Decreased Interest 3 1 1 1 1 1 1   Down, Depressed, Hopeless 3 1 1 1 1 1 1   PHQ - 2 Score 6 2 2 2 2 2 2   Altered sleeping 3 - - - - 0 -  Tired, decreased energy 3 - - - - 1 -  Change in appetite 1 - - - - 0 -  Feeling bad or failure about yourself  3 - - - - 0 -  Trouble concentrating 3 - - - - 0 -  Moving slowly or fidgety/restless 3 - - - - 0 -  Suicidal thoughts 0 - - - - 0 -  PHQ-9 Score 22 - - - - 3 -  Difficult doing work/chores - - - - - Not difficult at all -  Some recent data might be hidden     Review of Systems  Musculoskeletal: Positive for gait problem.  Neurological: Positive for tremors and numbness.       Tingling     Psychiatric/Behavioral: The patient is nervous/anxious.        Objective:   Physical Exam Vitals and nursing note reviewed.  Constitutional:  Appearance: Normal appearance.  Cardiovascular:     Rate and Rhythm: Normal rate and regular rhythm.     Pulses: Normal pulses.     Heart sounds: Normal heart sounds.  Pulmonary:     Effort: Pulmonary effort is normal.     Breath sounds: Normal breath sounds.  Musculoskeletal:     Cervical back: Normal range of motion and neck supple.     Comments: Normal Muscle Bulk and Muscle Testing Reveals:  Upper Extremities: Right Full ROM and Muscle Strength 5/5 Left: Decreased ROM 90 Degrees and Muscle Strength 4/5 Bilateral AC Joint Tenderness L>R Lower Extremities: Decreased ROM and Muscle Strength 4/5 Bilateral Lower Extremity Flexion Produces Pain into her lower extremities and bilateral foot  Arises from Table Slowly Antalgic Gait   Skin:    General: Skin is warm and dry.  Neurological:     Mental Status: She is alert and oriented to person, place, and time.  Psychiatric:        Mood and Affect: Mood normal.        Behavior: Behavior normal.           Assessment & Plan:  1.Complex Regional Pain Syndrome Type 1: Continue Topamax and Cymbalta.05/13/2020. Refilled:Hydro-codone 7.5/325mg one tablet twice a day one in the morning and one at bedtime#60.ContinueTramadol 100 mg ER daily #30 and Tramadol 50 mg one tablet by mouth BID. One tablet in the afternoon and one tablet in the evening. We will continue the opioid monitoring program, this consists of regular clinic visits, examinations, urine drug screen, pill counts as well as use of [place] Controlled Substance Reporting System. 2. Depression: Continuecurrent medication regimen withCymbalta.05/13/2020 3. Insomnia: Continuecurrent medication regimen withTrazodone50 mg HS.05/13/2020 4. Muscle Spasms: Continuecurrent medication regimen  withTizanidine.05/13/2020 5. Chronic Pain Syndrome: Continue IbuprofenBID as needed.05/13/2020  F/U in 1 month   47mnutes of face to face patient care time was spent during this visit. All questions were encouraged and answered.

## 2020-05-15 DIAGNOSIS — Z1152 Encounter for screening for COVID-19: Secondary | ICD-10-CM | POA: Diagnosis not present

## 2020-05-16 DIAGNOSIS — Z1211 Encounter for screening for malignant neoplasm of colon: Secondary | ICD-10-CM | POA: Diagnosis not present

## 2020-05-16 DIAGNOSIS — Z01818 Encounter for other preprocedural examination: Secondary | ICD-10-CM | POA: Diagnosis not present

## 2020-05-16 DIAGNOSIS — K648 Other hemorrhoids: Secondary | ICD-10-CM | POA: Diagnosis not present

## 2020-05-21 DIAGNOSIS — K635 Polyp of colon: Secondary | ICD-10-CM | POA: Diagnosis not present

## 2020-05-23 ENCOUNTER — Other Ambulatory Visit: Payer: Self-pay

## 2020-05-23 ENCOUNTER — Encounter: Payer: Self-pay | Admitting: Internal Medicine

## 2020-05-23 ENCOUNTER — Ambulatory Visit (INDEPENDENT_AMBULATORY_CARE_PROVIDER_SITE_OTHER): Payer: Medicare HMO | Admitting: Internal Medicine

## 2020-05-23 VITALS — BP 114/84 | HR 77 | Temp 98.3°F | Ht 67.5 in | Wt 171.0 lb

## 2020-05-23 DIAGNOSIS — G90529 Complex regional pain syndrome I of unspecified lower limb: Secondary | ICD-10-CM | POA: Diagnosis not present

## 2020-05-23 DIAGNOSIS — K219 Gastro-esophageal reflux disease without esophagitis: Secondary | ICD-10-CM | POA: Diagnosis not present

## 2020-05-23 DIAGNOSIS — Z0000 Encounter for general adult medical examination without abnormal findings: Secondary | ICD-10-CM | POA: Diagnosis not present

## 2020-05-23 DIAGNOSIS — K900 Celiac disease: Secondary | ICD-10-CM | POA: Diagnosis not present

## 2020-05-23 DIAGNOSIS — I10 Essential (primary) hypertension: Secondary | ICD-10-CM

## 2020-05-23 DIAGNOSIS — F39 Unspecified mood [affective] disorder: Secondary | ICD-10-CM

## 2020-05-23 DIAGNOSIS — R69 Illness, unspecified: Secondary | ICD-10-CM | POA: Diagnosis not present

## 2020-05-23 LAB — LIPID PANEL
Cholesterol: 212 mg/dL — ABNORMAL HIGH (ref 0–200)
HDL: 37.9 mg/dL — ABNORMAL LOW (ref >39.00)
NonHDL: 174.08
Total CHOL/HDL Ratio: 6
Triglycerides: 305 mg/dL — ABNORMAL HIGH (ref 0.0–149.0)
VLDL: 61 mg/dL — ABNORMAL HIGH (ref 0.0–40.0)

## 2020-05-23 LAB — CBC
HCT: 42.1 % (ref 36.0–46.0)
Hemoglobin: 13.6 g/dL (ref 12.0–15.0)
MCHC: 32.3 g/dL (ref 30.0–36.0)
MCV: 89.3 fl (ref 78.0–100.0)
Platelets: 244 10*3/uL (ref 150.0–400.0)
RBC: 4.72 Mil/uL (ref 3.87–5.11)
RDW: 13.5 % (ref 11.5–15.5)
WBC: 8.1 10*3/uL (ref 4.0–10.5)

## 2020-05-23 LAB — COMPREHENSIVE METABOLIC PANEL
ALT: 11 U/L (ref 0–35)
AST: 13 U/L (ref 0–37)
Albumin: 4.4 g/dL (ref 3.5–5.2)
Alkaline Phosphatase: 79 U/L (ref 39–117)
BUN: 14 mg/dL (ref 6–23)
CO2: 26 mEq/L (ref 19–32)
Calcium: 9.2 mg/dL (ref 8.4–10.5)
Chloride: 107 mEq/L (ref 96–112)
Creatinine, Ser: 0.93 mg/dL (ref 0.40–1.20)
GFR: 62.32 mL/min (ref >60.00)
Glucose, Bld: 107 mg/dL — ABNORMAL HIGH (ref 70–99)
Potassium: 4 mEq/L (ref 3.5–5.1)
Sodium: 138 mEq/L (ref 135–145)
Total Bilirubin: 0.3 mg/dL (ref 0.2–1.2)
Total Protein: 6.8 g/dL (ref 6.0–8.3)

## 2020-05-23 LAB — LDL CHOLESTEROL, DIRECT: Direct LDL: 120 mg/dL

## 2020-05-23 NOTE — Progress Notes (Signed)
Hearing Screening   Method: Audiometry   125Hz  250Hz  500Hz  1000Hz  2000Hz  3000Hz  4000Hz  6000Hz  8000Hz   Right ear:   40 0 20  20    Left ear:   0 0 25  20      Visual Acuity Screening   Right eye Left eye Both eyes  Without correction: 20/25 20/25 20/25   With correction:

## 2020-05-23 NOTE — Assessment & Plan Note (Signed)
Ongoing pain management

## 2020-05-23 NOTE — Assessment & Plan Note (Signed)
I have personally reviewed the Medicare Annual Wellness questionnaire and have noted 1. The patient's medical and social history 2. Their use of alcohol, tobacco or illicit drugs 3. Their current medications and supplements 4. The patient's functional ability including ADL's, fall risks, home safety risks and hearing or visual             impairment. 5. Diet and physical activities 6. Evidence for depression or mood disorders  The patients weight, height, BMI and visual acuity have been recorded in the chart I have made referrals, counseling and provided education to the patient based review of the above and I have provided the pt with a written personalized care plan for preventive services.  I have provided you with a copy of your personalized plan for preventive services. Please take the time to review along with your updated medication list.  Just had colon---more polyps so 3 year recall Due for mammo now--gets yearly No Pap due to hyster Urged her to get the COVID vaccine Flu vaccine in the fall Try to do some resistance exercise

## 2020-05-23 NOTE — Assessment & Plan Note (Signed)
Mostly controlled with nexium

## 2020-05-23 NOTE — Progress Notes (Signed)
Subjective:    Patient ID: [name], female    DOB: [birth year], 54 y.o.   MRN: [id number]  HPI Here for Medicare wellness visit and follow up of chronic health conditions This visit occurred during the SARS-CoV-2 public health emergency.  Safety protocols were in place, including screening questions prior to the visit, additional usage of staff PPE, and extensive cleaning of exam room while observing appropriate contact time as indicated for disinfecting solutions.   Reviewed form and advanced directives Reviewed other doctors No alcohol or tobacco Unable to exercise due to pain Mild functional issues with hearing--nothing serious Still trips frequently--like on stairs. No injuries Chronic mood issues Very limited in activities No sig memory problems  Chronic pain--still sees physiatry Gets Rx from them---other than tizanidine Does limited housework---does all personal care Takes the topiramate for nerve pain---and as migtraine preventative  Depressed every day Duloxetine 83m daily COVID has really affected her still--anxiety much worse Hasn't gotten the vaccine---"afraid of it" Counseled on this  Celiac disease quiet Avoids gluten  No chest pain  No SOB Chronic dizziness --no syncope Slight edema---related to RSD Palpitations with the anxiety  Current Outpatient Medications on File Prior to Visit  Medication Sig Dispense Refill  . albuterol (VENTOLIN HFA) 108 (90 Base) MCG/ACT inhaler Inhale 2 puffs into the lungs every 6 (six) hours as needed for wheezing or shortness of breath. 18 g 1  . clobetasol cream (TEMOVATE) 09.93% Apply 1 application topically 2 (two) times daily. 30 g 0  . CYMBALTA 30 MG capsule Take 3 capsules (90 mg total) by mouth daily. 270 capsule 2  . esomeprazole (NEXIUM) 40 MG capsule TAKE 1 CAPSULE (40 MG TOTAL) BY MOUTH 2 (TWO) TIMES DAILY. QTY/DAY SUPPLY PER INS 90 capsule 3  . HYDROcodone-acetaminophen (NORCO) 7.5-325 MG tablet TAKE  1 TABLET Twice a Day. One in the Morning and one at Bedtime 60 tablet 0  . ibuprofen (ADVIL) 600 MG tablet TAKE 1 TABLET (600 MG TOTAL) BY MOUTH EVERY 8 (EIGHT) HOURS AS NEEDED FOR MILD PAIN. 90 tablet 5  . ketoconazole (NIZORAL) 2 % cream Apply 1 application topically daily. 45 g 1  . lidocaine (LIDODERM) 5 % MAY USE UP TO 2 PATCHES DAILY. REMOVE AND DISCARED PATCHES AFTER 12 HOURS OR AS DIRECTED 60 patch 4  . loratadine (CLARITIN) 10 MG tablet Take 10 mg by mouth daily.      . metoCLOPramide (REGLAN) 10 MG tablet Take 10 mg by mouth 2 (two) times daily.    . montelukast (SINGULAIR) 10 MG tablet TAKE 1 TABLET BY MOUTH EVERYDAY AT BEDTIME 90 tablet 3  . olmesartan (BENICAR) 40 MG tablet TAKE 1 TABLET BY MOUTH EVERY DAY 90 tablet 3  . tiZANidine (ZANAFLEX) 4 MG tablet TAKE 1 TABLET (4 MG TOTAL) BY MOUTH 4 (FOUR) TIMES DAILY. 120 tablet 3  . topiramate (TOPAMAX) 100 MG tablet TAKE 1 TABLET BY MOUTH EVERY MORNING AND TAKE 2 TABS EVERY EVENING. 90 tablet 3  . traMADol (ULTRAM) 50 MG tablet TAKE 1 TABLET BY MOUTH TWICE A DAY. DIRECTION CHANGE. Do Not Fill Before 05/25/2020 60 tablet 2  . traMADol (ULTRAM-ER) 100 MG 24 hr tablet Take 1 tablet (100 mg total) by mouth daily. Do Not Fill Before 05/25/2020 30 tablet 2  . traZODone (DESYREL) 50 MG tablet Take one and half tablet at bedtime 45 tablet 3  . [DISCONTINUED] losartan (COZAAR) 100 MG tablet TAKE 1 TABLET BY MOUTH EVERY DAY 90 tablet 0  No current facility-administered medications on file prior to visit.    Allergies  Allergen Reactions  . Demerol [Meperidine] Anaphylaxis, Itching and Swelling  . Cymbalta [Duloxetine Hcl] Hives    Generic makes her feel like she is crawling out of her skin  . Diazepam     REACTION: swelling  . Meperidine Hcl     REACTION: swelling  . Other     [place]    Past Medical History:  Diagnosis Date  . Allergy   . Anxiety   . Arthritis    "just the norm" (01/24/2013)  . ASCVD (arteriosclerotic  cardiovascular disease)   . Asthma   . Celiac disease   . Daily headache    "depends on the season" (01/24/2013)  . Fibromyalgia   . GERD (gastroesophageal reflux disease)   . History of colonic polyps   . Hyperlipidemia   . Hypertension   . Migraines   . Obesity (BMI 30.0-34.9)   . RSD (reflex sympathetic dystrophy)   . Sleep apnea    NO MACHINE RECOMMENDED  . TIA (transient ischemic attack) ~ 2009    Past Surgical History:  Procedure Laterality Date  . APPENDECTOMY  ~ 06/2007  . BLADDER REPAIR  ~ 06/2007   "same day after bladder lift" (01/24/2013)  . BLADDER SUSPENSION  ~ 06/2007  . BREAST BIOPSY Left   . DIAGNOSTIC LAPAROSCOPY  1990's & ~ 2000   "I've had a couple; for endometrosis" (01/24/2013)  . HERNIA REPAIR    . INCISIONAL HERNIA REPAIR N/A 01/24/2013   Procedure: LAPAROSCOPIC INCISIONAL HERNIA;  Surgeon: Harl Bowie, MD;  Location: [place];  Service: General;  Laterality: N/A;  . INSERTION OF MESH N/A 01/24/2013   Procedure: INSERTION OF MESH;  Surgeon: Harl Bowie, MD;  Location: [place];  Service: General;  Laterality: N/A;  . LAPAROSCOPIC INCISIONAL / UMBILICAL / [place]  01/24/2013   IHR w/mesh/notes  . NASAL SEPTUM SURGERY  1980's?  . OOPHORECTOMY Right 2009  . TONSILLECTOMY  1990's  . VAGINAL HYSTERECTOMY  ` 06/2007    Family History  Problem Relation Age of Onset  . Cancer Mother        lung  . Hypertension Mother   . Cancer Father        colon  . Hypertension Sister   . Cancer Sister   . Cancer Brother   . Hyperlipidemia Sister   . Birth defects Maternal Grandmother        breast  . Breast cancer Maternal Grandmother   . Birth defects Paternal Grandmother        uterine, stomach, lung  . Breast cancer Paternal Grandmother     Social History   Socioeconomic History  . Marital status: Married    Spouse name: Not on file  . Number of children: 2  . Years of education: Not on file  . Highest education level: Not on file    Occupational History  . 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 Smoking status: Never Smoker  . Smokeless tobacco: Never Used  Substance and Sexual Activity  . Alcohol use: Yes    Alcohol/week: 0.0 standard drinks    Comment: 01/24/2013 "drink or 2 once or /twice/year, if that"  . Drug use: No  . Sexual activity: Not Currently  Other Topics Concern  . Not on file  Social History Narrative   No living will   Would want husband as POA--then sister, Neoma Laming  Would accept resuscitation attempts   Probably would not want tube feeds if cognitively unaware   Social Determinants of Health   Financial Resource Strain:   . Difficulty of Paying Living Expenses:   Food Insecurity:   . Worried About Charity fundraiser in the Last Year:   . Arboriculturist in the Last Year:   Transportation Needs:   . Film/video editor (Medical):   Marland Kitchen Lack of Transportation (Non-Medical):   Physical Activity:   . Days of Exercise per Week:   . Minutes of Exercise per Session:   Stress:   . Feeling of Stress :   Social Connections:   . Frequency of Communication with Friends and Family:   . Frequency of Social Gatherings with Friends and Family:   . Attends Religious Services:   . Active Member of Clubs or Organizations:   . Attends Archivist Meetings:   Marland Kitchen Marital Status:   Intimate Partner Violence:   . Fear of Current or Ex-Partner:   . Emotionally Abused:   Marland Kitchen Physically Abused:   . Sexually Abused:    Review of Systems Appetite is okay Weight stable Chronic sleep issues---nothing new Wears seat belt Teeth are okay---keeps up with dentist No skin problems Bad reflux ---mostly controlled with nexium. No major dysphagia Bowels are normal for her No dysuria or hematuria. Rare urge incontinence Some back pain. No sig other joint pains    Objective:   Physical Exam Constitutional:      Appearance: Normal appearance.  HENT:     Head: Normocephalic and atraumatic.      Mouth/Throat:     Comments: No oral lesions Eyes:     Conjunctiva/sclera: Conjunctivae normal.     Pupils: Pupils are equal, round, and reactive to light.  Cardiovascular:     Rate and Rhythm: Normal rate and regular rhythm.     Pulses: Normal pulses.     Heart sounds: No murmur heard.  No gallop.   Pulmonary:     Effort: Pulmonary effort is normal.     Breath sounds: Normal breath sounds. No wheezing or rales.  Abdominal:     Palpations: Abdomen is soft.     Tenderness: There is no abdominal tenderness.  Musculoskeletal:     Cervical back: Neck supple.     Right lower leg: No edema.     Left lower leg: No edema.  Lymphadenopathy:     Cervical: No cervical adenopathy.  Skin:    Findings: No rash.  Neurological:     Mental Status: She is alert and oriented to person, place, and time.     Comments: President--- "Biden, Trump, Obama" 100-93- doesn't do numbers well D-l-r-o-w Recall 2/3  Psychiatric:        Behavior: Behavior normal.     Comments: No sig depression noted---at most mild            Assessment & Plan:

## 2020-05-23 NOTE — Assessment & Plan Note (Signed)
Chronic depression and anxiety May want to consider increasing duloxetine to 141m daily

## 2020-05-23 NOTE — Assessment & Plan Note (Signed)
Doing well on gluten free diet

## 2020-05-23 NOTE — Assessment & Plan Note (Signed)
BP Readings from Last 3 Encounters:  05/23/20 114/84  05/13/20 127/84  04/14/20 123/81   Good control on olmesartan Will check labs

## 2020-05-30 ENCOUNTER — Encounter: Payer: Self-pay | Admitting: Genetic Counselor

## 2020-06-07 DIAGNOSIS — K648 Other hemorrhoids: Secondary | ICD-10-CM | POA: Diagnosis not present

## 2020-06-07 DIAGNOSIS — Z86010 Personal history of colonic polyps: Secondary | ICD-10-CM | POA: Diagnosis not present

## 2020-06-07 DIAGNOSIS — Z01818 Encounter for other preprocedural examination: Secondary | ICD-10-CM | POA: Diagnosis not present

## 2020-06-17 ENCOUNTER — Encounter: Payer: Worker's Compensation | Attending: Registered Nurse | Admitting: Registered Nurse

## 2020-06-17 ENCOUNTER — Encounter: Payer: Self-pay | Admitting: Registered Nurse

## 2020-06-17 ENCOUNTER — Other Ambulatory Visit: Payer: Self-pay

## 2020-06-17 VITALS — BP 123/81 | HR 82 | Temp 98.7°F | Ht 67.5 in | Wt 171.0 lb

## 2020-06-17 DIAGNOSIS — G4700 Insomnia, unspecified: Secondary | ICD-10-CM | POA: Insufficient documentation

## 2020-06-17 DIAGNOSIS — M62838 Other muscle spasm: Secondary | ICD-10-CM | POA: Diagnosis present

## 2020-06-17 DIAGNOSIS — G90512 Complex regional pain syndrome I of left upper limb: Secondary | ICD-10-CM | POA: Insufficient documentation

## 2020-06-17 DIAGNOSIS — Z79891 Long term (current) use of opiate analgesic: Secondary | ICD-10-CM | POA: Diagnosis present

## 2020-06-17 DIAGNOSIS — F3289 Other specified depressive episodes: Secondary | ICD-10-CM | POA: Insufficient documentation

## 2020-06-17 DIAGNOSIS — G894 Chronic pain syndrome: Secondary | ICD-10-CM | POA: Diagnosis present

## 2020-06-17 DIAGNOSIS — Z5181 Encounter for therapeutic drug level monitoring: Secondary | ICD-10-CM | POA: Diagnosis present

## 2020-06-17 MED ORDER — HYDROCODONE-ACETAMINOPHEN 7.5-325 MG PO TABS: ORAL_TABLET | ORAL | 0 refills | Status: DC

## 2020-06-17 NOTE — Progress Notes (Signed)
Subjective:    Patient ID: [name], female    DOB: [birth year], 54 y.o.   MRN: [id number]  HPI: [name] is a 54 y.o. female who returns for follow up appointment for chronic pain and medication refill. She states her pain is located in her left arm and left wrist. She r ates her pain 9. Her  current exercise regime is walking short distances.   [name] Morphine equivalent is 35.00  MME.  Oral Swab was Performed Today.   Pain Inventory Average Pain 9 Pain Right Now 9 My pain is intermittent, constant, sharp, burning, dull, stabbing, tingling and aching  In the last 24 hours, has pain interfered with the following? General activity 2 Relation with others 2 Enjoyment of life 2 What TIME of day is your pain at its worst? morning , daytime, evening and night Sleep (in general) Poor  Pain is worse with: walking, bending, sitting, inactivity, standing and some activites Pain improves with: rest, pacing activities and medication Relief from Meds: 6  Family History  Problem Relation Age of Onset  . Cancer Mother        lung  . Hypertension Mother   . Cancer Father        colon  . Hypertension Sister   . Cancer Sister   . Cancer Brother   . Hyperlipidemia Sister   . Birth defects Maternal Grandmother        breast  . Breast cancer Maternal Grandmother   . Birth defects Paternal Grandmother        uterine, stomach, lung  . Breast cancer Paternal Grandmother    Social History   Socioeconomic History  . Marital status: Married    Spouse name: Not on file  . Number of children: 2  . Years of education: Not on file  . Highest education level: Not on file  Occupational History  . 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 Smoking status: Never Smoker  . Smokeless tobacco: Never Used  Substance and Sexual Activity  . Alcohol use: Yes    Alcohol/week: 0.0 standard drinks    Comment: 01/24/2013 "drink or 2 once or /twice/year, if  that"  . Drug use: No  . Sexual activity: Not Currently  Other Topics Concern  . Not on file  Social History Narrative   No living will   Would want husband as POA--then sister, Neoma Laming   Would accept resuscitation attempts   Probably would not want tube feeds if cognitively unaware   Social Determinants of Health   Financial Resource Strain:   . Difficulty of Paying Living Expenses: Not on file  Food Insecurity:   . Worried About Charity fundraiser in the Last Year: Not on file  . Ran Out of Food in the Last Year: Not on file  Transportation Needs:   . Lack of Transportation (Medical): Not on file  . Lack of Transportation (Non-Medical): Not on file  Physical Activity:   . Days of Exercise per Week: Not on file  . Minutes of Exercise per Session: Not on file  Stress:   . Feeling of Stress : Not on file  Social Connections:   . Frequency of Communication with Friends and Family: Not on file  . Frequency of Social Gatherings with Friends and Family: Not on file  . Attends Religious Services: Not on file  . Active Member of Clubs or Organizations: Not on file  . Attends Archivist  Meetings: Not on file  . Marital Status: Not on file   Past Surgical History:  Procedure Laterality Date  . APPENDECTOMY  ~ 06/2007  . BLADDER REPAIR  ~ 06/2007   "same day after bladder lift" (01/24/2013)  . BLADDER SUSPENSION  ~ 06/2007  . BREAST BIOPSY Left   . DIAGNOSTIC LAPAROSCOPY  1990's & ~ 2000   "I've had a couple; for endometrosis" (01/24/2013)  . HERNIA REPAIR    . INCISIONAL HERNIA REPAIR N/A 01/24/2013   Procedure: LAPAROSCOPIC INCISIONAL HERNIA;  Surgeon: Harl Bowie, MD;  Location: [place];  Service: General;  Laterality: N/A;  . INSERTION OF MESH N/A 01/24/2013   Procedure: INSERTION OF MESH;  Surgeon: Harl Bowie, MD;  Location: [place];  Service: General;  Laterality: N/A;  . LAPAROSCOPIC INCISIONAL / UMBILICAL / [place]  01/24/2013   IHR w/mesh/notes    . NASAL SEPTUM SURGERY  1980's?  . OOPHORECTOMY Right 2009  . TONSILLECTOMY  1990's  . VAGINAL HYSTERECTOMY  ` 06/2007   Past Surgical History:  Procedure Laterality Date  . APPENDECTOMY  ~ 06/2007  . BLADDER REPAIR  ~ 06/2007   "same day after bladder lift" (01/24/2013)  . BLADDER SUSPENSION  ~ 06/2007  . BREAST BIOPSY Left   . DIAGNOSTIC LAPAROSCOPY  1990's & ~ 2000   "I've had a couple; for endometrosis" (01/24/2013)  . HERNIA REPAIR    . INCISIONAL HERNIA REPAIR N/A 01/24/2013   Procedure: LAPAROSCOPIC INCISIONAL HERNIA;  Surgeon: Harl Bowie, MD;  Location: [place];  Service: General;  Laterality: N/A;  . INSERTION OF MESH N/A 01/24/2013   Procedure: INSERTION OF MESH;  Surgeon: Harl Bowie, MD;  Location: [place];  Service: General;  Laterality: N/A;  . LAPAROSCOPIC INCISIONAL / UMBILICAL / [place]  01/24/2013   IHR w/mesh/notes  . NASAL SEPTUM SURGERY  1980's?  . OOPHORECTOMY Right 2009  . TONSILLECTOMY  1990's  . VAGINAL HYSTERECTOMY  ` 06/2007   Past Medical History:  Diagnosis Date  . Allergy   . Anxiety   . Arthritis    "just the norm" (01/24/2013)  . ASCVD (arteriosclerotic cardiovascular disease)   . Asthma   . Celiac disease   . Daily headache    "depends on the season" (01/24/2013)  . Fibromyalgia   . GERD (gastroesophageal reflux disease)   . History of colonic polyps   . Hyperlipidemia   . Hypertension   . Migraines   . Obesity (BMI 30.0-34.9)   . RSD (reflex sympathetic dystrophy)   . Sleep apnea    NO MACHINE RECOMMENDED  . TIA (transient ischemic attack) ~ 2009   BP 123/81   Pulse 82   Temp 98.7 F (37.1 C)   Ht 5' 7.5" (1.715 m)   Wt 171 lb (77.6 kg)   SpO2 95%   BMI 26.39 kg/m   Opioid Risk Score:   Fall Risk Score:  `1  Depression screen PHQ 2/9  Depression screen [facility] 2/9 04/14/2020 02/26/2019 11/29/2018 09/07/2018 05/09/2018 02/06/2018 01/09/2018  Decreased Interest 3 1 1 1 1 1 1   Down, Depressed, Hopeless 3 1 1 1 1 1 1   PHQ  - 2 Score 6 2 2 2 2 2 2   Altered sleeping 3 - - - - 0 -  Tired, decreased energy 3 - - - - 1 -  Change in appetite 1 - - - - 0 -  Feeling bad or failure about yourself  3 - - - -  0 -  Trouble concentrating 3 - - - - 0 -  Moving slowly or fidgety/restless 3 - - - - 0 -  Suicidal thoughts 0 - - - - 0 -  PHQ-9 Score 22 - - - - 3 -  Difficult doing work/chores - - - - - Not difficult at all -  Some recent data might be hidden    Review of Systems  Constitutional: Negative.   HENT: Negative.   Eyes: Negative.   Respiratory: Negative.   Cardiovascular: Negative.   Gastrointestinal: Negative.   Endocrine: Negative.   Genitourinary: Negative.   Musculoskeletal: Positive for arthralgias and myalgias.  Skin: Negative.   Allergic/Immunologic: Negative.   Neurological: Positive for weakness and numbness.       Tingling  Hematological: Negative.   Psychiatric/Behavioral: Negative.   All other systems reviewed and are negative.      Objective:   Physical Exam Vitals and nursing note reviewed.  Constitutional:      Appearance: Normal appearance.  Cardiovascular:     Rate and Rhythm: Normal rate and regular rhythm.     Pulses: Normal pulses.     Heart sounds: Normal heart sounds.  Pulmonary:     Effort: Pulmonary effort is normal.     Breath sounds: Normal breath sounds.  Musculoskeletal:     Cervical back: Normal range of motion and neck supple.     Comments: Normal Muscle Bulk and Muscle Testing Reveals:  Upper Extremities: Right: Full ROM and Muscle Strength 5/5 Left Upper extremity: Decreased ROM 90 Degrees and Muscle Strength 3/5 Left AC Joint Tenderness  Thoracic Paraspinal Tenderness: T-7-T-9  Lower Extremities: Decreased ROM and Muscle Strength 4/5 Bilateral Lower Extremity Flexion Produces Pain into her Bilateral Feet  Arises from Table Slowly Antalgic Gait   Skin:    General: Skin is warm and dry.  Neurological:     Mental Status: She is alert and oriented to  person, place, and time.  Psychiatric:        Mood and Affect: Mood normal.        Behavior: Behavior normal.           Assessment & Plan:  1.Complex Regional Pain Syndrome Type 1: Continue Topamax and Cymbalta.06/17/2020. Refilled:Hydro-codone 7.5/325mg one tablet twice a day one in the morning and one at bedtime#60.ContinueTramadol 100 mg ER daily #30 and Tramadol 50 mg one tablet by mouth BID. One tablet in the afternoon and one tablet in the evening. We will continue the opioid monitoring program, this consists of regular clinic visits, examinations, urine drug screen, pill counts as well as use of [place] Controlled Substance Reporting system. A 12 month History has been reviewed on the [place] Controlled Substance Reporting System  On 06/17/2020. 2. Depression: Continuecurrent medication regimen withCymbalta.06/17/2020 3. Insomnia: Continuecurrent medication regimen withTrazodone50 mg HS.06/17/2020 4. Muscle Spasms: Continuecurrent medication regimen withTizanidine.06/17/2020 5. Chronic Pain Syndrome: Continue IbuprofenBID as needed.06/17/2020  F/U in 1 month   57mnutes of face to face patient care time was spent during this visit. All questions were encouraged and answered.

## 2020-06-18 ENCOUNTER — Other Ambulatory Visit: Payer: Self-pay | Admitting: Internal Medicine

## 2020-06-20 LAB — DRUG TOX MONITOR 1 W/CONF, ORAL FLD
Alprazolam: NEGATIVE ng/mL (ref <0.50)
Amphetamines: NEGATIVE ng/mL (ref <10)
Barbiturates: NEGATIVE ng/mL (ref <10)
Benzodiazepines: NEGATIVE ng/mL (ref <0.50)
Buprenorphine: NEGATIVE ng/mL (ref <0.10)
Clonazepam: NEGATIVE ng/mL (ref <0.50)
Cocaine: NEGATIVE ng/mL (ref <5.0)
Codeine: NEGATIVE ng/mL (ref <2.5)
Diazepam: NEGATIVE ng/mL (ref <0.50)
Dihydrocodeine: 4.1 ng/mL — ABNORMAL HIGH (ref <2.5)
Fentanyl: NEGATIVE ng/mL (ref <0.10)
Flunitrazepam: NEGATIVE ng/mL (ref <0.50)
Flurazepam: NEGATIVE ng/mL (ref <0.50)
Heroin Metabolite: NEGATIVE ng/mL (ref <1.0)
Hydrocodone: 66.4 ng/mL — ABNORMAL HIGH (ref <2.5)
Hydromorphone: NEGATIVE ng/mL (ref <2.5)
Lorazepam: NEGATIVE ng/mL (ref <0.50)
MARIJUANA: NEGATIVE ng/mL (ref <2.5)
MDMA: NEGATIVE ng/mL (ref <10)
Meprobamate: NEGATIVE ng/mL (ref <2.5)
Methadone: NEGATIVE ng/mL (ref <5.0)
Midazolam: NEGATIVE ng/mL (ref <0.50)
Morphine: NEGATIVE ng/mL (ref <2.5)
Nicotine Metabolite: NEGATIVE ng/mL (ref <5.0)
Nordiazepam: NEGATIVE ng/mL (ref <0.50)
Norhydrocodone: 8.9 ng/mL — ABNORMAL HIGH (ref <2.5)
Noroxycodone: NEGATIVE ng/mL (ref <2.5)
Opiates: POSITIVE ng/mL — AB (ref <2.5)
Oxazepam: NEGATIVE ng/mL (ref <0.50)
Oxycodone: NEGATIVE ng/mL (ref <2.5)
Oxymorphone: NEGATIVE ng/mL (ref <2.5)
Phencyclidine: NEGATIVE ng/mL (ref <10)
Tapentadol: NEGATIVE ng/mL (ref <5.0)
Temazepam: NEGATIVE ng/mL (ref <0.50)
Tramadol: >500 ng/mL — ABNORMAL HIGH (ref <5.0)
Tramadol: POSITIVE ng/mL — AB (ref <5.0)
Triazolam: NEGATIVE ng/mL (ref <0.50)
Zolpidem: NEGATIVE ng/mL (ref <5.0)

## 2020-06-20 LAB — DRUG TOX ALC METAB W/CON, ORAL FLD: Alcohol Metabolite: NEGATIVE ng/mL (ref <25)

## 2020-06-25 ENCOUNTER — Telehealth: Payer: Self-pay | Admitting: *Deleted

## 2020-06-25 NOTE — Telephone Encounter (Signed)
Oral swab drug screen was consistent for prescribed medications.  ?

## 2020-07-16 ENCOUNTER — Encounter: Payer: Worker's Compensation | Attending: Registered Nurse | Admitting: Registered Nurse

## 2020-07-16 ENCOUNTER — Encounter: Payer: Self-pay | Admitting: Registered Nurse

## 2020-07-16 ENCOUNTER — Other Ambulatory Visit: Payer: Self-pay

## 2020-07-16 VITALS — BP 128/83 | HR 78 | Temp 98.4°F | Ht 67.75 in | Wt 169.8 lb

## 2020-07-16 DIAGNOSIS — Z5181 Encounter for therapeutic drug level monitoring: Secondary | ICD-10-CM | POA: Insufficient documentation

## 2020-07-16 DIAGNOSIS — Z79891 Long term (current) use of opiate analgesic: Secondary | ICD-10-CM | POA: Diagnosis present

## 2020-07-16 DIAGNOSIS — G894 Chronic pain syndrome: Secondary | ICD-10-CM | POA: Insufficient documentation

## 2020-07-16 DIAGNOSIS — G4700 Insomnia, unspecified: Secondary | ICD-10-CM | POA: Diagnosis present

## 2020-07-16 DIAGNOSIS — M62838 Other muscle spasm: Secondary | ICD-10-CM | POA: Insufficient documentation

## 2020-07-16 DIAGNOSIS — G90512 Complex regional pain syndrome I of left upper limb: Secondary | ICD-10-CM | POA: Insufficient documentation

## 2020-07-16 DIAGNOSIS — F3289 Other specified depressive episodes: Secondary | ICD-10-CM | POA: Insufficient documentation

## 2020-07-16 MED ORDER — TRAMADOL HCL 50 MG PO TABS
ORAL_TABLET | ORAL | 2 refills | Status: DC
Start: 2020-07-16 — End: 2020-09-16

## 2020-07-16 MED ORDER — TIZANIDINE HCL 4 MG PO TABS: ORAL_TABLET | ORAL | 3 refills | Status: DC

## 2020-07-16 MED ORDER — TRAMADOL HCL ER 100 MG PO TB24
100.0000 mg | ORAL_TABLET | Freq: Every day | ORAL | 2 refills | Status: DC
Start: 2020-07-16 — End: 2020-09-16

## 2020-07-16 MED ORDER — HYDROCODONE-ACETAMINOPHEN 7.5-325 MG PO TABS
ORAL_TABLET | ORAL | 0 refills | Status: DC
Start: 2020-07-16 — End: 2020-08-15

## 2020-07-16 MED ORDER — TRAZODONE HCL 50 MG PO TABS: ORAL_TABLET | ORAL | 5 refills | Status: DC

## 2020-07-16 MED ORDER — TOPIRAMATE 100 MG PO TABS: ORAL_TABLET | ORAL | 5 refills | Status: DC

## 2020-07-16 NOTE — Progress Notes (Signed)
Subjective:    Patient ID: [name], female    DOB: [birth year], 54 y.o.   MRN: [id number]  HPI: [name] is a 54 y.o. female who returns for follow up appointment for chronic pain and medication refill. She states her pain is located in her left arm and left wrist. She rates her  Pain 7. Her current exercise regime is walking and performing stretching exercises.  [name] Morphine equivalent is 35.00  MME.    Last Oral Swab was Performed on 06/17/2020, it was consistent.   Pain Inventory Average Pain 7 Pain Right Now 7 My pain is constant, sharp, burning, dull, stabbing, tingling and aching  In the last 24 hours, has pain interfered with the following? General activity 7 Relation with others 7 Enjoyment of life 7 What TIME of day is your pain at its worst? morning , daytime, evening and night Sleep (in general) Poor  Pain is worse with: walking, bending, sitting, standing and some activites Pain improves with: rest, heat/ice, therapy/exercise, pacing activities, medication and TENS Relief from Meds: 6  Family History  Problem Relation Age of Onset  . Cancer Mother        lung  . Hypertension Mother   . Cancer Father        colon  . Hypertension Sister   . Cancer Sister   . Cancer Brother   . Hyperlipidemia Sister   . Birth defects Maternal Grandmother        breast  . Breast cancer Maternal Grandmother   . Birth defects Paternal Grandmother        uterine, stomach, lung  . Breast cancer Paternal Grandmother    Social History   Socioeconomic History  . Marital status: Married    Spouse name: Not on file  . Number of children: 2  . Years of education: Not on file  . Highest education level: Not on file  Occupational History  . 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 Smoking status: Never Smoker  . Smokeless tobacco: Never Used  Substance and Sexual Activity  . Alcohol use: Yes    Alcohol/week: 0.0 standard drinks     Comment: 01/24/2013 "drink or 2 once or /twice/year, if that"  . Drug use: No  . Sexual activity: Not Currently  Other Topics Concern  . Not on file  Social History Narrative   No living will   Would want husband as POA--then sister, Neoma Laming   Would accept resuscitation attempts   Probably would not want tube feeds if cognitively unaware   Social Determinants of Health   Financial Resource Strain:   . Difficulty of Paying Living Expenses: Not on file  Food Insecurity:   . Worried About Charity fundraiser in the Last Year: Not on file  . Ran Out of Food in the Last Year: Not on file  Transportation Needs:   . Lack of Transportation (Medical): Not on file  . Lack of Transportation (Non-Medical): Not on file  Physical Activity:   . Days of Exercise per Week: Not on file  . Minutes of Exercise per Session: Not on file  Stress:   . Feeling of Stress : Not on file  Social Connections:   . Frequency of Communication with Friends and Family: Not on file  . Frequency of Social Gatherings with Friends and Family: Not on file  . Attends Religious Services: Not on file  . Active Member of Clubs or Organizations: Not on  file  . Attends Archivist Meetings: Not on file  . Marital Status: Not on file   Past Surgical History:  Procedure Laterality Date  . APPENDECTOMY  ~ 06/2007  . BLADDER REPAIR  ~ 06/2007   "same day after bladder lift" (01/24/2013)  . BLADDER SUSPENSION  ~ 06/2007  . BREAST BIOPSY Left   . DIAGNOSTIC LAPAROSCOPY  1990's & ~ 2000   "I've had a couple; for endometrosis" (01/24/2013)  . HERNIA REPAIR    . INCISIONAL HERNIA REPAIR N/A 01/24/2013   Procedure: LAPAROSCOPIC INCISIONAL HERNIA;  Surgeon: Harl Bowie, MD;  Location: [place];  Service: General;  Laterality: N/A;  . INSERTION OF MESH N/A 01/24/2013   Procedure: INSERTION OF MESH;  Surgeon: Harl Bowie, MD;  Location: [place];  Service: General;  Laterality: N/A;  . LAPAROSCOPIC INCISIONAL / UMBILICAL /  [place]  01/24/2013   IHR w/mesh/notes  . NASAL SEPTUM SURGERY  1980's?  . OOPHORECTOMY Right 2009  . TONSILLECTOMY  1990's  . VAGINAL HYSTERECTOMY  ` 06/2007   Past Surgical History:  Procedure Laterality Date  . APPENDECTOMY  ~ 06/2007  . BLADDER REPAIR  ~ 06/2007   "same day after bladder lift" (01/24/2013)  . BLADDER SUSPENSION  ~ 06/2007  . BREAST BIOPSY Left   . DIAGNOSTIC LAPAROSCOPY  1990's & ~ 2000   "I've had a couple; for endometrosis" (01/24/2013)  . HERNIA REPAIR    . INCISIONAL HERNIA REPAIR N/A 01/24/2013   Procedure: LAPAROSCOPIC INCISIONAL HERNIA;  Surgeon: Harl Bowie, MD;  Location: [place];  Service: General;  Laterality: N/A;  . INSERTION OF MESH N/A 01/24/2013   Procedure: INSERTION OF MESH;  Surgeon: Harl Bowie, MD;  Location: [place];  Service: General;  Laterality: N/A;  . LAPAROSCOPIC INCISIONAL / UMBILICAL / [place]  01/24/2013   IHR w/mesh/notes  . NASAL SEPTUM SURGERY  1980's?  . OOPHORECTOMY Right 2009  . TONSILLECTOMY  1990's  . VAGINAL HYSTERECTOMY  ` 06/2007   Past Medical History:  Diagnosis Date  . Allergy   . Anxiety   . Arthritis    "just the norm" (01/24/2013)  . ASCVD (arteriosclerotic cardiovascular disease)   . Asthma   . Celiac disease   . Daily headache    "depends on the season" (01/24/2013)  . Fibromyalgia   . GERD (gastroesophageal reflux disease)   . History of colonic polyps   . Hyperlipidemia   . Hypertension   . Migraines   . Obesity (BMI 30.0-34.9)   . RSD (reflex sympathetic dystrophy)   . Sleep apnea    NO MACHINE RECOMMENDED  . TIA (transient ischemic attack) ~ 2009   BP 128/83   Pulse 78   Temp 98.4 F (36.9 C)   Ht 5' 7.75" (1.721 m)   Wt 169 lb 12.8 oz (77 kg)   SpO2 92%   BMI 26.01 kg/m   Opioid Risk Score:   Fall Risk Score:  `1  Depression screen PHQ 2/9  Depression screen [facility] 2/9 04/14/2020 02/26/2019 11/29/2018 09/07/2018 05/09/2018 02/06/2018 01/09/2018  Decreased Interest 3 1  1 1 1 1 1   Down, Depressed, Hopeless 3 1 1 1 1 1 1   PHQ - 2 Score 6 2 2 2 2 2 2   Altered sleeping 3 - - - - 0 -  Tired, decreased energy 3 - - - - 1 -  Change in appetite 1 - - - - 0 -  Feeling bad or failure about yourself  3 - - - - 0 -  Trouble concentrating 3 - - - - 0 -  Moving slowly or fidgety/restless 3 - - - - 0 -  Suicidal thoughts 0 - - - - 0 -  PHQ-9 Score 22 - - - - 3 -  Difficult doing work/chores - - - - - Not difficult at all -  Some recent data might be hidden    Review of Systems  Musculoskeletal:       Left hand and arm pain  Neurological: Positive for tremors, weakness and numbness.       Tingling  All other systems reviewed and are negative.      Objective:   Physical Exam Vitals and nursing note reviewed.  Constitutional:      Appearance: Normal appearance.  Cardiovascular:     Rate and Rhythm: Normal rate and regular rhythm.     Pulses: Normal pulses.     Heart sounds: Normal heart sounds.  Pulmonary:     Effort: Pulmonary effort is normal.     Breath sounds: Normal breath sounds.  Musculoskeletal:     Cervical back: Normal range of motion and neck supple.     Comments: Normal Muscle Bulk and Muscle Testing Reveals:  Upper Extremities: Full ROM and Muscle Strength on Right 5/5 and Left 4/5  Thoracic Paraspinal Tenderness: T-1-T-3  Lower Extremities: Full ROM and Muscle Strength 5/5 Bilateral Lower Extremity Flexion Produces Pain into her Bilateral Lower extremities Arises from Table Slowly Narrow Based  Gait   Skin:    General: Skin is warm and dry.  Neurological:     Mental Status: She is alert and oriented to person, place, and time.  Psychiatric:        Mood and Affect: Mood normal.        Behavior: Behavior normal.           Assessment & Plan:  1.Complex Regional Pain Syndrome Type 1: Continue Topamax and Cymbalta.07/16/2020. Refilled:Hydro-codone 7.5/325mg one tablet twice a day one in the morning and one at  bedtime#60.ContinueTramadol 100 mg ER daily #30 and Tramadol 50 mg one tablet by mouth BID. One tablet in the afternoon and one tablet in the evening. We will continue the opioid monitoring program, this consists of regular clinic visits, examinations, urine drug screen, pill counts as well as use of [place] Controlled Substance Reporting system. A 12 month History has been reviewed on the [place] Controlled Substance Reporting System  On 07/16/2020. 2. Depression: Continuecurrent medication regimen withCymbalta.07/16/2020 3. Insomnia: Continuecurrent medication regimen withTrazodone one and half tablet at bedtime 07/16/2020 4. Muscle Spasms: Continuecurrent medication regimen withTizanidine.07/16/2020 5. Chronic Pain Syndrome: Continue IbuprofenBID as needed.07/16/2020  F/U in 1 month   68mnutes of face to face patient care time was spent during this visit. All questions were encouraged and answered.

## 2020-07-17 DIAGNOSIS — K648 Other hemorrhoids: Secondary | ICD-10-CM | POA: Diagnosis not present

## 2020-07-17 DIAGNOSIS — Z01818 Encounter for other preprocedural examination: Secondary | ICD-10-CM | POA: Diagnosis not present

## 2020-07-17 DIAGNOSIS — Z1211 Encounter for screening for malignant neoplasm of colon: Secondary | ICD-10-CM | POA: Diagnosis not present

## 2020-07-23 DIAGNOSIS — Z20822 Contact with and (suspected) exposure to covid-19: Secondary | ICD-10-CM | POA: Diagnosis not present

## 2020-07-23 DIAGNOSIS — U071 COVID-19: Secondary | ICD-10-CM | POA: Diagnosis not present

## 2020-07-31 DIAGNOSIS — U071 COVID-19: Secondary | ICD-10-CM | POA: Diagnosis not present

## 2020-08-15 ENCOUNTER — Encounter: Payer: Worker's Compensation | Attending: Registered Nurse | Admitting: Registered Nurse

## 2020-08-15 ENCOUNTER — Encounter: Payer: Self-pay | Admitting: Registered Nurse

## 2020-08-15 ENCOUNTER — Other Ambulatory Visit: Payer: Self-pay

## 2020-08-15 VITALS — Ht 67.0 in | Wt 165.0 lb

## 2020-08-15 DIAGNOSIS — G4700 Insomnia, unspecified: Secondary | ICD-10-CM | POA: Insufficient documentation

## 2020-08-15 DIAGNOSIS — G90512 Complex regional pain syndrome I of left upper limb: Secondary | ICD-10-CM | POA: Diagnosis present

## 2020-08-15 DIAGNOSIS — M62838 Other muscle spasm: Secondary | ICD-10-CM | POA: Insufficient documentation

## 2020-08-15 DIAGNOSIS — G894 Chronic pain syndrome: Secondary | ICD-10-CM | POA: Insufficient documentation

## 2020-08-15 DIAGNOSIS — Z79891 Long term (current) use of opiate analgesic: Secondary | ICD-10-CM | POA: Insufficient documentation

## 2020-08-15 DIAGNOSIS — Z5181 Encounter for therapeutic drug level monitoring: Secondary | ICD-10-CM | POA: Diagnosis present

## 2020-08-15 DIAGNOSIS — F3289 Other specified depressive episodes: Secondary | ICD-10-CM | POA: Insufficient documentation

## 2020-08-15 MED ORDER — HYDROCODONE-ACETAMINOPHEN 7.5-325 MG PO TABS
ORAL_TABLET | ORAL | 0 refills | Status: DC
Start: 2020-08-15 — End: 2020-09-16

## 2020-08-15 NOTE — Progress Notes (Addendum)
Subjective:    Patient ID: [name], female    DOB: 04-12-1966, 54 y.o.   MRN: [id number]  HPI: [name] is a 54 y.o. female whose appointment was changed to a telephone visit, she arrived to office febrile. Her temperature was checked twice and she remains febrile. [name] agrees with telephone visit and verbalizes understanding. [name] states she was diagnosed with COVID- 19 on 07/23/2020 and was on quarantine till 08/03/2020. She was instructed to call her PCP and for her to have another COVID-19 test, she verbalizes understanding. She will send a My-Chart message once she speaks with her PCP.   She states her pain is located in her left arm. She  Rates her pain 8. Her  current exercise regime is walking.  Geryl Rankins CMA asked The health and History questions. This provider and Mancel Parsons verified we were speaking with the correct person using two identifiers.   [name] Morphine equivalent is 36.17 MME.   Last Oral Swab was Performed on 06/17/2020, it was consistent.    Pain Inventory Average Pain 8 Pain Right Now 8 My pain is ?  In the last 24 hours, has pain interfered with the following? General activity 5 Relation with others 5 Enjoyment of life 5 What TIME of day is your pain at its worst? daytime, evening and night Sleep (in general) Poor  Pain is worse with: walking, bending, sitting, standing, unsure and some activites Pain improves with: rest, heat/ice, pacing activities and medication Relief from Meds: 6  Family History  Problem Relation Age of Onset  . Cancer Mother        lung  . Hypertension Mother   . Cancer Father        colon  . Hypertension Sister   . Cancer Sister   . Cancer Brother   . Hyperlipidemia Sister   . Birth defects Maternal Grandmother        breast  . Breast cancer Maternal Grandmother   . Birth defects Paternal Grandmother        uterine, stomach, lung  . Breast cancer  Paternal Grandmother    Social History   Socioeconomic History  . Marital status: Married    Spouse name: Not on file  . Number of children: 2  . Years of education: Not on file  . Highest education level: Not on file  Occupational History  . 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 Smoking status: Never Smoker  . Smokeless tobacco: Never Used  Substance and Sexual Activity  . Alcohol use: Yes    Alcohol/week: 0.0 standard drinks    Comment: 01/24/2013 "drink or 2 once or /twice/year, if that"  . Drug use: No  . Sexual activity: Not Currently  Other Topics Concern  . Not on file  Social History Narrative   No living will   Would want husband as POA--then sister, Neoma Laming   Would accept resuscitation attempts   Probably would not want tube feeds if cognitively unaware   Social Determinants of Health   Financial Resource Strain:   . Difficulty of Paying Living Expenses: Not on file  Food Insecurity:   . Worried About Charity fundraiser in the Last Year: Not on file  . Ran Out of Food in the Last Year: Not on file  Transportation Needs:   . Lack of Transportation (Medical): Not on file  . Lack of Transportation (Non-Medical): Not on file  Physical Activity:   .  Days of Exercise per Week: Not on file  . Minutes of Exercise per Session: Not on file  Stress:   . Feeling of Stress : Not on file  Social Connections:   . Frequency of Communication with Friends and Family: Not on file  . Frequency of Social Gatherings with Friends and Family: Not on file  . Attends Religious Services: Not on file  . Active Member of Clubs or Organizations: Not on file  . Attends Archivist Meetings: Not on file  . Marital Status: Not on file   Past Surgical History:  Procedure Laterality Date  . APPENDECTOMY  ~ 06/2007  . BLADDER REPAIR  ~ 06/2007   "same day after bladder lift" (01/24/2013)  . BLADDER SUSPENSION  ~ 06/2007  . BREAST BIOPSY Left   . DIAGNOSTIC LAPAROSCOPY   1990's & ~ 2000   "I've had a couple; for endometrosis" (01/24/2013)  . HERNIA REPAIR    . INCISIONAL HERNIA REPAIR N/A 01/24/2013   Procedure: LAPAROSCOPIC INCISIONAL HERNIA;  Surgeon: Harl Bowie, MD;  Location: [place];  Service: General;  Laterality: N/A;  . INSERTION OF MESH N/A 01/24/2013   Procedure: INSERTION OF MESH;  Surgeon: Harl Bowie, MD;  Location: [place];  Service: General;  Laterality: N/A;  . LAPAROSCOPIC INCISIONAL / UMBILICAL / [place]  01/24/2013   IHR w/mesh/notes  . NASAL SEPTUM SURGERY  1980's?  . OOPHORECTOMY Right 2009  . TONSILLECTOMY  1990's  . VAGINAL HYSTERECTOMY  ` 06/2007   Past Surgical History:  Procedure Laterality Date  . APPENDECTOMY  ~ 06/2007  . BLADDER REPAIR  ~ 06/2007   "same day after bladder lift" (01/24/2013)  . BLADDER SUSPENSION  ~ 06/2007  . BREAST BIOPSY Left   . DIAGNOSTIC LAPAROSCOPY  1990's & ~ 2000   "I've had a couple; for endometrosis" (01/24/2013)  . HERNIA REPAIR    . INCISIONAL HERNIA REPAIR N/A 01/24/2013   Procedure: LAPAROSCOPIC INCISIONAL HERNIA;  Surgeon: Harl Bowie, MD;  Location: [place];  Service: General;  Laterality: N/A;  . INSERTION OF MESH N/A 01/24/2013   Procedure: INSERTION OF MESH;  Surgeon: Harl Bowie, MD;  Location: [place];  Service: General;  Laterality: N/A;  . LAPAROSCOPIC INCISIONAL / UMBILICAL / [place]  01/24/2013   IHR w/mesh/notes  . NASAL SEPTUM SURGERY  1980's?  . OOPHORECTOMY Right 2009  . TONSILLECTOMY  1990's  . VAGINAL HYSTERECTOMY  ` 06/2007   Past Medical History:  Diagnosis Date  . Allergy   . Anxiety   . Arthritis    "just the norm" (01/24/2013)  . ASCVD (arteriosclerotic cardiovascular disease)   . Asthma   . Celiac disease   . Daily headache    "depends on the season" (01/24/2013)  . Fibromyalgia   . GERD (gastroesophageal reflux disease)   . History of colonic polyps   . Hyperlipidemia   . Hypertension   . Migraines   . Obesity (BMI 30.0-34.9)    . RSD (reflex sympathetic dystrophy)   . Sleep apnea    NO MACHINE RECOMMENDED  . TIA (transient ischemic attack) ~ 2009   Ht 5' 7"  (1.702 m)   Wt 165 lb (74.8 kg)   BMI 25.84 kg/m   Opioid Risk Score:   Fall Risk Score:  `1  Depression screen PHQ 2/9  Depression screen [facility] 2/9 04/14/2020 02/26/2019 11/29/2018 09/07/2018 05/09/2018 02/06/2018 01/09/2018  Decreased Interest 3 1 1 1 1 1  1  Down, Depressed, Hopeless 3 1 1 1 1 1 1   PHQ - 2 Score 6 2 2 2 2 2 2   Altered sleeping 3 - - - - 0 -  Tired, decreased energy 3 - - - - 1 -  Change in appetite 1 - - - - 0 -  Feeling bad or failure about yourself  3 - - - - 0 -  Trouble concentrating 3 - - - - 0 -  Moving slowly or fidgety/restless 3 - - - - 0 -  Suicidal thoughts 0 - - - - 0 -  PHQ-9 Score 22 - - - - 3 -  Difficult doing work/chores - - - - - Not difficult at all -  Some recent data might be hidden    Review of Systems  Constitutional: Negative.   HENT: Negative.   Eyes: Negative.   Respiratory: Negative.   Cardiovascular: Negative.   Gastrointestinal: Negative.   Endocrine: Negative.   Genitourinary: Negative.   Musculoskeletal: Positive for back pain.  Skin: Negative.   Allergic/Immunologic: Negative.   Hematological: Negative.   Psychiatric/Behavioral: Negative.   All other systems reviewed and are negative.      Objective:   Physical Exam Vitals and nursing note reviewed.  Musculoskeletal:     Comments: No Physical Exam Performed: Telephone Visit           Assessment & Plan:  1.Complex Regional Pain Syndrome Type 1: Continue Topamax and Cymbalta.08/15/2020. Refilled:Hydro-codone 7.5/325mg one tablet twice a day one in the morning and one at bedtime#60.ContinueTramadol 100 mg ER daily #30 and Tramadol 50 mg one tablet by mouth BID. One tablet in the afternoon and one tablet in the evening. We will continue the opioid monitoring program, this consists of regular clinic visits, examinations, urine  drug screen, pill counts as well as use of [place] Controlled Substance Reporting system. A 12 month History has been reviewed on the [place] Controlled Substance Reporting SystemOn 08/15/2020. 2. Depression: Continuecurrent medication regimen withCymbalta.08/15/2020 3. Insomnia: Continuecurrent medication regimen withTrazodone one and half tablet at bedtime 08/15/2020 4. Muscle Spasms: Continuecurrent medication regimen withTizanidine.08/15/2020 5. Chronic Pain Syndrome: Continue IbuprofenBID as needed.08/15/2020  F/U in 1 month  Telephone Call Established Patient Location of Patient: In her Care Location of Provider in the office

## 2020-08-17 DIAGNOSIS — Z9189 Other specified personal risk factors, not elsewhere classified: Secondary | ICD-10-CM | POA: Diagnosis not present

## 2020-08-17 DIAGNOSIS — J22 Unspecified acute lower respiratory infection: Secondary | ICD-10-CM | POA: Diagnosis not present

## 2020-08-18 ENCOUNTER — Other Ambulatory Visit: Payer: Self-pay | Admitting: Registered Nurse

## 2020-09-16 ENCOUNTER — Encounter: Payer: Self-pay | Admitting: Registered Nurse

## 2020-09-16 ENCOUNTER — Encounter: Payer: Worker's Compensation | Attending: Registered Nurse | Admitting: Registered Nurse

## 2020-09-16 ENCOUNTER — Other Ambulatory Visit: Payer: Self-pay

## 2020-09-16 VITALS — BP 133/88 | HR 87 | Temp 98.8°F | Ht 67.0 in | Wt 170.4 lb

## 2020-09-16 DIAGNOSIS — G90512 Complex regional pain syndrome I of left upper limb: Secondary | ICD-10-CM | POA: Insufficient documentation

## 2020-09-16 DIAGNOSIS — G894 Chronic pain syndrome: Secondary | ICD-10-CM | POA: Insufficient documentation

## 2020-09-16 DIAGNOSIS — M62838 Other muscle spasm: Secondary | ICD-10-CM | POA: Diagnosis present

## 2020-09-16 DIAGNOSIS — Z5181 Encounter for therapeutic drug level monitoring: Secondary | ICD-10-CM | POA: Diagnosis present

## 2020-09-16 DIAGNOSIS — G4700 Insomnia, unspecified: Secondary | ICD-10-CM | POA: Diagnosis present

## 2020-09-16 DIAGNOSIS — F3289 Other specified depressive episodes: Secondary | ICD-10-CM | POA: Diagnosis present

## 2020-09-16 DIAGNOSIS — Z79891 Long term (current) use of opiate analgesic: Secondary | ICD-10-CM | POA: Insufficient documentation

## 2020-09-16 MED ORDER — TRAMADOL HCL ER 100 MG PO TB24
100.0000 mg | ORAL_TABLET | Freq: Every day | ORAL | 2 refills | Status: DC
Start: 2020-09-16 — End: 2020-12-15

## 2020-09-16 MED ORDER — TIZANIDINE HCL 4 MG PO TABS: ORAL_TABLET | ORAL | 3 refills | Status: DC

## 2020-09-16 MED ORDER — HYDROCODONE-ACETAMINOPHEN 7.5-325 MG PO TABS
ORAL_TABLET | ORAL | 0 refills | Status: DC
Start: 2020-09-16 — End: 2020-10-08

## 2020-09-16 MED ORDER — TRAMADOL HCL 50 MG PO TABS
ORAL_TABLET | ORAL | 2 refills | Status: DC
Start: 2020-09-16 — End: 2020-12-15

## 2020-09-16 NOTE — Progress Notes (Signed)
Subjective:    Patient ID: [name], female    DOB: [birth year], 54 y.o.   MRN: [id number]  HPI: [name] is a 54 y.o. female who returns for follow up appointment for chronic pain and medication refill. She states her  pain is located in her left hand and left wrist. She rates her pain 7. Her  current exercise regime is walking short distances   [name] Morphine equivalent is 35.50 MME.  Last Oral Swab was Performed on 06/17/2020, it was consistent.    Pain Inventory Average Pain 8 Pain Right Now 7 My pain is intermittent, constant, sharp, burning, dull, stabbing, tingling and aching  In the last 24 hours, has pain interfered with the following? General activity 7 Relation with others 7 Enjoyment of life 7 What TIME of day is your pain at its worst? morning , daytime, evening, night and varies Sleep (in general) Poor  Pain is worse with: walking, bending, sitting, standing, some activites and anything Pain improves with: rest, heat/ice, therapy/exercise, pacing activities, medication and TENS Relief from Meds: 6  Family History  Problem Relation Age of Onset  . Cancer Mother        lung  . Hypertension Mother   . Cancer Father        colon  . Hypertension Sister   . Cancer Sister   . Cancer Brother   . Hyperlipidemia Sister   . Birth defects Maternal Grandmother        breast  . Breast cancer Maternal Grandmother   . Birth defects Paternal Grandmother        uterine, stomach, lung  . Breast cancer Paternal Grandmother    Social History   Socioeconomic History  . Marital status: Married    Spouse name: Not on file  . Number of children: 2  . Years of education: Not on file  . Highest education level: Not on file  Occupational History  . 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 Smoking status: Never Smoker  . Smokeless tobacco: Never Used  Substance and Sexual Activity  . Alcohol use: Yes    Alcohol/week: 0.0  standard drinks    Comment: 01/24/2013 "drink or 2 once or /twice/year, if that"  . Drug use: No  . Sexual activity: Not Currently  Other Topics Concern  . Not on file  Social History Narrative   No living will   Would want husband as POA--then sister, Neoma Laming   Would accept resuscitation attempts   Probably would not want tube feeds if cognitively unaware   Social Determinants of Health   Financial Resource Strain:   . Difficulty of Paying Living Expenses: Not on file  Food Insecurity:   . Worried About Charity fundraiser in the Last Year: Not on file  . Ran Out of Food in the Last Year: Not on file  Transportation Needs:   . Lack of Transportation (Medical): Not on file  . Lack of Transportation (Non-Medical): Not on file  Physical Activity:   . Days of Exercise per Week: Not on file  . Minutes of Exercise per Session: Not on file  Stress:   . Feeling of Stress : Not on file  Social Connections:   . Frequency of Communication with Friends and Family: Not on file  . Frequency of Social Gatherings with Friends and Family: Not on file  . Attends Religious Services: Not on file  . Active Member of Clubs or Organizations: Not  on file  . Attends Archivist Meetings: Not on file  . Marital Status: Not on file   Past Surgical History:  Procedure Laterality Date  . APPENDECTOMY  ~ 06/2007  . BLADDER REPAIR  ~ 06/2007   "same day after bladder lift" (01/24/2013)  . BLADDER SUSPENSION  ~ 06/2007  . BREAST BIOPSY Left   . DIAGNOSTIC LAPAROSCOPY  1990's & ~ 2000   "I've had a couple; for endometrosis" (01/24/2013)  . HERNIA REPAIR    . INCISIONAL HERNIA REPAIR N/A 01/24/2013   Procedure: LAPAROSCOPIC INCISIONAL HERNIA;  Surgeon: Harl Bowie, MD;  Location: [place];  Service: General;  Laterality: N/A;  . INSERTION OF MESH N/A 01/24/2013   Procedure: INSERTION OF MESH;  Surgeon: Harl Bowie, MD;  Location: [place];  Service: General;  Laterality: N/A;  . LAPAROSCOPIC  INCISIONAL / UMBILICAL / [place]  01/24/2013   IHR w/mesh/notes  . NASAL SEPTUM SURGERY  1980's?  . OOPHORECTOMY Right 2009  . TONSILLECTOMY  1990's  . VAGINAL HYSTERECTOMY  ` 06/2007   Past Surgical History:  Procedure Laterality Date  . APPENDECTOMY  ~ 06/2007  . BLADDER REPAIR  ~ 06/2007   "same day after bladder lift" (01/24/2013)  . BLADDER SUSPENSION  ~ 06/2007  . BREAST BIOPSY Left   . DIAGNOSTIC LAPAROSCOPY  1990's & ~ 2000   "I've had a couple; for endometrosis" (01/24/2013)  . HERNIA REPAIR    . INCISIONAL HERNIA REPAIR N/A 01/24/2013   Procedure: LAPAROSCOPIC INCISIONAL HERNIA;  Surgeon: Harl Bowie, MD;  Location: [place];  Service: General;  Laterality: N/A;  . INSERTION OF MESH N/A 01/24/2013   Procedure: INSERTION OF MESH;  Surgeon: Harl Bowie, MD;  Location: [place];  Service: General;  Laterality: N/A;  . LAPAROSCOPIC INCISIONAL / UMBILICAL / [place]  01/24/2013   IHR w/mesh/notes  . NASAL SEPTUM SURGERY  1980's?  . OOPHORECTOMY Right 2009  . TONSILLECTOMY  1990's  . VAGINAL HYSTERECTOMY  ` 06/2007   Past Medical History:  Diagnosis Date  . Allergy   . Anxiety   . Arthritis    "just the norm" (01/24/2013)  . ASCVD (arteriosclerotic cardiovascular disease)   . Asthma   . Celiac disease   . Daily headache    "depends on the season" (01/24/2013)  . Fibromyalgia   . GERD (gastroesophageal reflux disease)   . History of colonic polyps   . Hyperlipidemia   . Hypertension   . Migraines   . Obesity (BMI 30.0-34.9)   . RSD (reflex sympathetic dystrophy)   . Sleep apnea    NO MACHINE RECOMMENDED  . TIA (transient ischemic attack) ~ 2009   There were no vitals taken for this visit.  Opioid Risk Score:   Fall Risk Score:  `1  Depression screen PHQ 2/9  Depression screen [facility] 2/9 04/14/2020 02/26/2019 11/29/2018 09/07/2018 05/09/2018 02/06/2018 01/09/2018  Decreased Interest 3 1 1 1 1 1 1   Down, Depressed, Hopeless 3 1 1 1 1 1 1   PHQ - 2  Score 6 2 2 2 2 2 2   Altered sleeping 3 - - - - 0 -  Tired, decreased energy 3 - - - - 1 -  Change in appetite 1 - - - - 0 -  Feeling bad or failure about yourself  3 - - - - 0 -  Trouble concentrating 3 - - - - 0 -  Moving slowly or fidgety/restless 3 - - - -  0 -  Suicidal thoughts 0 - - - - 0 -  PHQ-9 Score 22 - - - - 3 -  Difficult doing work/chores - - - - - Not difficult at all -  Some recent data might be hidden   Review of Systems  Musculoskeletal:       Left arm  All other systems reviewed and are negative.      Objective:   Physical Exam Vitals and nursing note reviewed.  Constitutional:      Appearance: Normal appearance.  Cardiovascular:     Rate and Rhythm: Normal rate and regular rhythm.     Pulses: Normal pulses.     Heart sounds: Normal heart sounds.  Pulmonary:     Effort: Pulmonary effort is normal.     Breath sounds: Normal breath sounds.  Musculoskeletal:     Cervical back: Normal range of motion and neck supple.     Comments: Normal Muscle Bulk and Muscle Testing Reveals: Upper Extremities: Right: Full ROM and Muscle Strength 5/5 Left Lower Extremity: Decreased ROM 90 Degrees and Muscle Strength 2/5 Lower Extremities: Full ROM and Muscle Strength 5/5 Bilateral Lower Extremities Flexion Produces Pain into her Bilateral lower extremities and bilateral feet Arises from Table slowly Antalgic Gait   Skin:    General: Skin is warm and dry.  Neurological:     Mental Status: She is alert and oriented to person, place, and time.  Psychiatric:        Mood and Affect: Mood normal.        Behavior: Behavior normal.           Assessment & Plan:  1.Complex Regional Pain Syndrome Type 1: Continue Topamax and Cymbalta.09/16/2020. Refilled:Hydro-codone 7.5/325mg one tablet twice a day one in the morning and one at bedtime#60.ContinueTramadol 100 mg ER daily #30 and Tramadol 50 mg one tablet by mouth BID. One tablet in the afternoon and one tablet in the  evening. We will continue the opioid monitoring program, this consists of regular clinic visits, examinations, urine drug screen, pill counts as well as use of [place] Controlled Substance Reporting system. A 12 month History has been reviewed on the [place] Controlled Substance Reporting SystemOn 09/16/2020. 2. Depression: Continuecurrent medication regimen withCymbalta.09/16/2020 3. Insomnia: Continuecurrent medication regimen withTrazodoneone and half tablet at bedtime11/30/2021 4. Muscle Spasms: Continuecurrent medication regimen withTizanidine.09/16/2020 5. Chronic Pain Syndrome: Continue IbuprofenBID as needed.09/16/2020  F/U in 1 month

## 2020-10-08 ENCOUNTER — Encounter: Payer: Worker's Compensation | Attending: Registered Nurse | Admitting: Registered Nurse

## 2020-10-08 ENCOUNTER — Other Ambulatory Visit: Payer: Self-pay

## 2020-10-08 ENCOUNTER — Encounter: Payer: Self-pay | Admitting: Registered Nurse

## 2020-10-08 VITALS — BP 128/81 | HR 86 | Temp 98.7°F | Ht 67.0 in | Wt 174.0 lb

## 2020-10-08 DIAGNOSIS — F3289 Other specified depressive episodes: Secondary | ICD-10-CM | POA: Diagnosis not present

## 2020-10-08 DIAGNOSIS — G4700 Insomnia, unspecified: Secondary | ICD-10-CM | POA: Diagnosis not present

## 2020-10-08 DIAGNOSIS — G894 Chronic pain syndrome: Secondary | ICD-10-CM | POA: Insufficient documentation

## 2020-10-08 DIAGNOSIS — Z5181 Encounter for therapeutic drug level monitoring: Secondary | ICD-10-CM | POA: Insufficient documentation

## 2020-10-08 DIAGNOSIS — G90512 Complex regional pain syndrome I of left upper limb: Secondary | ICD-10-CM | POA: Insufficient documentation

## 2020-10-08 DIAGNOSIS — Z79891 Long term (current) use of opiate analgesic: Secondary | ICD-10-CM | POA: Diagnosis present

## 2020-10-08 DIAGNOSIS — M62838 Other muscle spasm: Secondary | ICD-10-CM | POA: Insufficient documentation

## 2020-10-08 MED ORDER — HYDROCODONE-ACETAMINOPHEN 7.5-325 MG PO TABS: ORAL_TABLET | ORAL | 0 refills | Status: DC

## 2020-10-08 NOTE — Progress Notes (Signed)
Subjective:    Patient ID: [name], female    DOB: 1966/03/30, 54 y.o.   MRN: [id number]  HPI: [name] is a 54 y.o. female who returns for follow up appointment for chronic pain and medication refill. She states her pain is located in her left arm, left hand and left wrist. She rates her pain 6. Her current exercise regime is walking.  [name] Morphine equivalent is 35.00 MME.    Last Oral Swab was Performed on 06/17/2020, it was consistent.   Pain Inventory Average Pain 7 Pain Right Now 6 My pain is sharp, burning, dull, stabbing, tingling and aching  In the last 24 hours, has pain interfered with the following? General activity 4 Relation with others 4 Enjoyment of life 4 What TIME of day is your pain at its worst? morning , daytime, evening and night Sleep (in general) Poor  Pain is worse with: walking, bending, sitting, standing and some activites Pain improves with: rest, heat/ice, therapy/exercise, pacing activities, medication and TENS Relief from Meds: 7  Family History  Problem Relation Age of Onset  . Cancer Mother        lung  . Hypertension Mother   . Cancer Father        colon  . Hypertension Sister   . Cancer Sister   . Cancer Brother   . Hyperlipidemia Sister   . Birth defects Maternal Grandmother        breast  . Breast cancer Maternal Grandmother   . Birth defects Paternal Grandmother        uterine, stomach, lung  . Breast cancer Paternal Grandmother    Social History   Socioeconomic History  . Marital status: Married    Spouse name: Not on file  . Number of children: 2  . Years of education: Not on file  . Highest education level: Not on file  Occupational History  . 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 Smoking status: Never Smoker  . Smokeless tobacco: Never Used  Vaping Use  . Vaping Use: Never used  Substance and Sexual Activity  . Alcohol use: Yes    Alcohol/week: 0.0 standard drinks     Comment: 01/24/2013 "drink or 2 once or /twice/year, if that"  . Drug use: No  . Sexual activity: Not Currently  Other Topics Concern  . Not on file  Social History Narrative   No living will   Would want husband as POA--then sister, Neoma Laming   Would accept resuscitation attempts   Probably would not want tube feeds if cognitively unaware   Social Determinants of Health   Financial Resource Strain: Not on file  Food Insecurity: Not on file  Transportation Needs: Not on file  Physical Activity: Not on file  Stress: Not on file  Social Connections: Not on file   Past Surgical History:  Procedure Laterality Date  . APPENDECTOMY  ~ 06/2007  . BLADDER REPAIR  ~ 06/2007   "same day after bladder lift" (01/24/2013)  . BLADDER SUSPENSION  ~ 06/2007  . BREAST BIOPSY Left   . DIAGNOSTIC LAPAROSCOPY  1990's & ~ 2000   "I've had a couple; for endometrosis" (01/24/2013)  . HERNIA REPAIR    . INCISIONAL HERNIA REPAIR N/A 01/24/2013   Procedure: LAPAROSCOPIC INCISIONAL HERNIA;  Surgeon: Harl Bowie, MD;  Location: [place];  Service: General;  Laterality: N/A;  . INSERTION OF MESH N/A 01/24/2013   Procedure: INSERTION OF MESH;  Surgeon: Nathaneil Canary  Lynann Beaver, MD;  Location: [place];  Service: General;  Laterality: N/A;  . LAPAROSCOPIC INCISIONAL / UMBILICAL / [place]  01/24/2013   IHR w/mesh/notes  . NASAL SEPTUM SURGERY  1980's?  . OOPHORECTOMY Right 2009  . TONSILLECTOMY  1990's  . VAGINAL HYSTERECTOMY  ` 06/2007   Past Surgical History:  Procedure Laterality Date  . APPENDECTOMY  ~ 06/2007  . BLADDER REPAIR  ~ 06/2007   "same day after bladder lift" (01/24/2013)  . BLADDER SUSPENSION  ~ 06/2007  . BREAST BIOPSY Left   . DIAGNOSTIC LAPAROSCOPY  1990's & ~ 2000   "I've had a couple; for endometrosis" (01/24/2013)  . HERNIA REPAIR    . INCISIONAL HERNIA REPAIR N/A 01/24/2013   Procedure: LAPAROSCOPIC INCISIONAL HERNIA;  Surgeon: Harl Bowie, MD;  Location: [place];  Service: General;   Laterality: N/A;  . INSERTION OF MESH N/A 01/24/2013   Procedure: INSERTION OF MESH;  Surgeon: Harl Bowie, MD;  Location: [place];  Service: General;  Laterality: N/A;  . LAPAROSCOPIC INCISIONAL / UMBILICAL / [place]  01/24/2013   IHR w/mesh/notes  . NASAL SEPTUM SURGERY  1980's?  . OOPHORECTOMY Right 2009  . TONSILLECTOMY  1990's  . VAGINAL HYSTERECTOMY  ` 06/2007   Past Medical History:  Diagnosis Date  . Allergy   . Anxiety   . Arthritis    "just the norm" (01/24/2013)  . ASCVD (arteriosclerotic cardiovascular disease)   . Asthma   . Celiac disease   . Daily headache    "depends on the season" (01/24/2013)  . Fibromyalgia   . GERD (gastroesophageal reflux disease)   . History of colonic polyps   . Hyperlipidemia   . Hypertension   . Migraines   . Obesity (BMI 30.0-34.9)   . RSD (reflex sympathetic dystrophy)   . Sleep apnea    NO MACHINE RECOMMENDED  . TIA (transient ischemic attack) ~ 2009   BP 128/81   Pulse 86   Temp 98.7 F (37.1 C)   Ht 5' 7"  (1.702 m)   Wt 174 lb (78.9 kg)   SpO2 94%   BMI 27.25 kg/m   Opioid Risk Score:   Fall Risk Score:  `1  Depression screen PHQ 2/9  Depression screen [facility] 2/9 09/16/2020 04/14/2020 02/26/2019 11/29/2018 09/07/2018 05/09/2018 02/06/2018  Decreased Interest 1 3 1 1 1 1 1   Down, Depressed, Hopeless - 3 1 1 1 1 1   PHQ - 2 Score 1 6 2 2 2 2 2   Altered sleeping - 3 - - - - 0  Tired, decreased energy - 3 - - - - 1  Change in appetite - 1 - - - - 0  Feeling bad or failure about yourself  - 3 - - - - 0  Trouble concentrating - 3 - - - - 0  Moving slowly or fidgety/restless - 3 - - - - 0  Suicidal thoughts - 0 - - - - 0  PHQ-9 Score - 22 - - - - 3  Difficult doing work/chores - - - - - - Not difficult at all  Some recent data might be hidden    Review of Systems  Musculoskeletal:       Hand  Neurological: Positive for numbness.  All other systems reviewed and are negative.      Objective:   Physical  Exam Vitals and nursing note reviewed.  Constitutional:      Appearance: Normal appearance.  HENT:  Head: Normocephalic and atraumatic.  Cardiovascular:     Rate and Rhythm: Normal rate and regular rhythm.     Pulses: Normal pulses.     Heart sounds: Normal heart sounds.  Pulmonary:     Effort: Pulmonary effort is normal.     Breath sounds: Normal breath sounds.  Musculoskeletal:     Cervical back: Normal range of motion and neck supple.     Comments: Normal Muscle Bulk and Muscle Testing Reveals:  Upper Extremities: Right : Full ROM and Muscle Strength 5/5 Left Upper Extremity: Decreased ROM 90 Degrees and Muscle Strength 4/5 Lower Extremities: Full ROM and Muscle Strength 5/5 Bilateral Lower Extremities Flexion Produces Pain into her bilateral lower extremity and bilateral feet Arises from Table slowly Antalgic Gait   Skin:    General: Skin is warm and dry.  Neurological:     Mental Status: She is alert and oriented to person, place, and time.  Psychiatric:        Mood and Affect: Mood normal.        Behavior: Behavior normal.           Assessment & Plan:  1.Complex Regional Pain Syndrome Type 1: Continue Topamax and Cymbalta.10/08/2020. Refilled:Hydro-codone 7.5/325mg one tablet twice a day one in the morning and one at bedtime#60.ContinueTramadol 100 mg ER daily #30 and Tramadol 50 mg one tablet by mouth BID. One tablet in the afternoon and one tablet in the evening. We will continue the opioid monitoring program, this consists of regular clinic visits, examinations, urine drug screen, pill counts as well as use of [place] Controlled Substance Reporting system. A 12 month History has been reviewed on the [place] Controlled Substance Reporting SystemOn12/22/2021. 2. Depression: Continuecurrent medication regimen withCymbalta.10/08/2020 3. Insomnia: Continuecurrent medication regimen withTrazodoneone and half tablet at bedtime12/22/2021 4.  Muscle Spasms: Continuecurrent medication regimen withTizanidine.10/08/2020 5. Chronic Pain Syndrome: Continue IbuprofenBID as needed.10/08/2020  F/U in 1 month

## 2020-10-15 ENCOUNTER — Telehealth: Payer: Self-pay

## 2020-10-15 NOTE — Telephone Encounter (Signed)
Recd pckage from claim adm iniatrator [place] - lvm at home for patient to call - does she know about this? Package up front in accordion folder

## 2020-11-17 ENCOUNTER — Encounter: Payer: Worker's Compensation | Attending: Registered Nurse | Admitting: Registered Nurse

## 2020-11-17 ENCOUNTER — Other Ambulatory Visit: Payer: Self-pay

## 2020-11-17 ENCOUNTER — Encounter: Payer: Self-pay | Admitting: Registered Nurse

## 2020-11-17 ENCOUNTER — Encounter: Payer: Worker's Compensation | Admitting: Registered Nurse

## 2020-11-17 VITALS — BP 128/81 | Ht 67.0 in | Wt 174.0 lb

## 2020-11-17 DIAGNOSIS — F3289 Other specified depressive episodes: Secondary | ICD-10-CM | POA: Diagnosis not present

## 2020-11-17 DIAGNOSIS — G894 Chronic pain syndrome: Secondary | ICD-10-CM

## 2020-11-17 DIAGNOSIS — G4700 Insomnia, unspecified: Secondary | ICD-10-CM | POA: Diagnosis not present

## 2020-11-17 DIAGNOSIS — Z79891 Long term (current) use of opiate analgesic: Secondary | ICD-10-CM

## 2020-11-17 DIAGNOSIS — M62838 Other muscle spasm: Secondary | ICD-10-CM | POA: Diagnosis not present

## 2020-11-17 DIAGNOSIS — G90512 Complex regional pain syndrome I of left upper limb: Secondary | ICD-10-CM

## 2020-11-17 DIAGNOSIS — Z5181 Encounter for therapeutic drug level monitoring: Secondary | ICD-10-CM

## 2020-11-17 MED ORDER — HYDROCODONE-ACETAMINOPHEN 7.5-325 MG PO TABS: ORAL_TABLET | ORAL | 0 refills | Status: DC

## 2020-11-17 NOTE — Progress Notes (Signed)
Subjective:     Patient ID: [name], female   DOB: [birth year], 55 y.o.   MRN: [id number]  HPI: [name] is a 55 y.o. female whose appointment was changed to My-Chart Video visit due to staffing, she also stated her daughter, son-in-law and grand children were visiting her. She just received a call from her daughter they tested positive for COVID She denies any symptoms at this time and will f/u with her PCP if any symptoms develop. . [name] agrees with My-Chart Video Visit and verbalizes understanding. She states her pain is located in her left arm and left wrist. She rates her pain 9. Her current exercise regime is walking and performing stretching exercises.  [name] Morphine equivalent is 35.00 MME.    Last Oral Swab was Performed on 06/17/2020, it was consistent.   Pain Inventory Average Pain 7 Pain Right Now 9 My pain is constant, sharp, burning, dull, stabbing, tingling and aching  In the last 24 hours, has pain interfered with the following? General activity 9 Relation with others 9 Enjoyment of life 2 What TIME of day is your pain at its worst? morning , daytime, evening, night and varies Sleep (in general) Poor  Pain is worse with: walking, bending, sitting, inactivity, standing and some activites Pain improves with: rest, heat/ice, therapy/exercise, pacing activities and medication Relief from Meds: 6  Family History  Problem Relation Age of Onset  . Cancer Mother        lung  . Hypertension Mother   . Cancer Father        colon  . Hypertension Sister   . Cancer Sister   . Cancer Brother   . Hyperlipidemia Sister   . Birth defects Maternal Grandmother        breast  . Breast cancer Maternal Grandmother   . Birth defects Paternal Grandmother        uterine, stomach, lung  . Breast cancer Paternal Grandmother    Social History   Socioeconomic History  . Marital status: Married    Spouse name: Not on file  .  Number of children: 2  . Years of education: Not on file  . Highest education level: Not on file  Occupational History  . 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 Smoking status: Never Smoker  . Smokeless tobacco: Never Used  Vaping Use  . Vaping Use: Never used  Substance and Sexual Activity  . Alcohol use: Yes    Alcohol/week: 0.0 standard drinks    Comment: 01/24/2013 "drink or 2 once or /twice/year, if that"  . Drug use: No  . Sexual activity: Not Currently  Other Topics Concern  . Not on file  Social History Narrative   No living will   Would want husband as POA--then sister, Neoma Laming   Would accept resuscitation attempts   Probably would not want tube feeds if cognitively unaware   Social Determinants of Health   Financial Resource Strain: Not on file  Food Insecurity: Not on file  Transportation Needs: Not on file  Physical Activity: Not on file  Stress: Not on file  Social Connections: Not on file   Past Surgical History:  Procedure Laterality Date  . APPENDECTOMY  ~ 06/2007  . BLADDER REPAIR  ~ 06/2007   "same day after bladder lift" (01/24/2013)  . BLADDER SUSPENSION  ~ 06/2007  . BREAST BIOPSY Left   . DIAGNOSTIC LAPAROSCOPY  1990's & ~ 2000   "I've had  a couple; for endometrosis" (01/24/2013)  . HERNIA REPAIR    . INCISIONAL HERNIA REPAIR N/A 01/24/2013   Procedure: LAPAROSCOPIC INCISIONAL HERNIA;  Surgeon: Harl Bowie, MD;  Location: [place];  Service: General;  Laterality: N/A;  . INSERTION OF MESH N/A 01/24/2013   Procedure: INSERTION OF MESH;  Surgeon: Harl Bowie, MD;  Location: [place];  Service: General;  Laterality: N/A;  . LAPAROSCOPIC INCISIONAL / UMBILICAL / [place]  01/24/2013   IHR w/mesh/notes  . NASAL SEPTUM SURGERY  1980's?  . OOPHORECTOMY Right 2009  . TONSILLECTOMY  1990's  . VAGINAL HYSTERECTOMY  ` 06/2007   Past Surgical History:  Procedure Laterality Date  . APPENDECTOMY  ~ 06/2007  . BLADDER REPAIR  ~ 06/2007    "same day after bladder lift" (01/24/2013)  . BLADDER SUSPENSION  ~ 06/2007  . BREAST BIOPSY Left   . DIAGNOSTIC LAPAROSCOPY  1990's & ~ 2000   "I've had a couple; for endometrosis" (01/24/2013)  . HERNIA REPAIR    . INCISIONAL HERNIA REPAIR N/A 01/24/2013   Procedure: LAPAROSCOPIC INCISIONAL HERNIA;  Surgeon: Harl Bowie, MD;  Location: [place];  Service: General;  Laterality: N/A;  . INSERTION OF MESH N/A 01/24/2013   Procedure: INSERTION OF MESH;  Surgeon: Harl Bowie, MD;  Location: [place];  Service: General;  Laterality: N/A;  . LAPAROSCOPIC INCISIONAL / UMBILICAL / [place]  01/24/2013   IHR w/mesh/notes  . NASAL SEPTUM SURGERY  1980's?  . OOPHORECTOMY Right 2009  . TONSILLECTOMY  1990's  . VAGINAL HYSTERECTOMY  ` 06/2007   Past Medical History:  Diagnosis Date  . Allergy   . Anxiety   . Arthritis    "just the norm" (01/24/2013)  . ASCVD (arteriosclerotic cardiovascular disease)   . Asthma   . Celiac disease   . Daily headache    "depends on the season" (01/24/2013)  . Fibromyalgia   . GERD (gastroesophageal reflux disease)   . History of colonic polyps   . Hyperlipidemia   . Hypertension   . Migraines   . Obesity (BMI 30.0-34.9)   . RSD (reflex sympathetic dystrophy)   . Sleep apnea    NO MACHINE RECOMMENDED  . TIA (transient ischemic attack) ~ 2009   BP 128/81 Comment: last recorded on 10/08/20  Ht 5' 7"  (1.702 m)   Wt 174 lb (78.9 kg)   BMI 27.25 kg/m   Opioid Risk Score:   Fall Risk Score:  `1  Depression screen PHQ 2/9  Depression screen [facility] 2/9 09/16/2020 04/14/2020 02/26/2019 11/29/2018 09/07/2018 05/09/2018 02/06/2018  Decreased Interest 1 3 1 1 1 1 1   Down, Depressed, Hopeless - 3 1 1 1 1 1   PHQ - 2 Score 1 6 2 2 2 2 2   Altered sleeping - 3 - - - - 0  Tired, decreased energy - 3 - - - - 1  Change in appetite - 1 - - - - 0  Feeling bad or failure about yourself  - 3 - - - - 0  Trouble concentrating - 3 - - - - 0  Moving slowly or  fidgety/restless - 3 - - - - 0  Suicidal thoughts - 0 - - - - 0  PHQ-9 Score - 22 - - - - 3  Difficult doing work/chores - - - - - - Not difficult at all  Some recent data might be hidden   Review of Systems  Musculoskeletal: Positive for gait  problem.       Left hand, left arm, left wrist  All other systems reviewed and are negative.      Objective:   Physical Exam Vitals and nursing note reviewed.  Musculoskeletal:     Comments: No Physical Exam Performed My Chart Video Visit        Assessment: Plan  1.Complex Regional Pain Syndrome Type 1: Continue Topamax and Cymbalta.11/17/2020. Refilled:Hydro-codone 7.5/325mg one tablet twice a day one in the morning and one at bedtime#60.ContinueTramadol 100 mg ER daily #30 and Tramadol 50 mg one tablet by mouth BID. One tablet in the afternoon and one tablet in the evening. We will continue the opioid monitoring program, this consists of regular clinic visits, examinations, urine drug screen, pill counts as well as use of [place] Controlled Substance Reporting system. A 12 month History has been reviewed on the [place] Controlled Substance Reporting SystemOn01/31/2022. 2. Depression: Continuecurrent medication regimen withCymbalta.11/17/2020 3. Insomnia: Continuecurrent medication regimen withTrazodoneone and half tablet at bedtime01/31/2022 4. Muscle Spasms: Continuecurrent medication regimen withTizanidine.11/17/2020 5. Chronic Pain Syndrome: Continue IbuprofenBID as needed.11/17/2020  F/U in 1 month  My-Chart Video Visit Established Patient Location of Patient: In Her Home Location of Provider In the Office

## 2020-12-15 ENCOUNTER — Encounter: Payer: Worker's Compensation | Attending: Registered Nurse | Admitting: Registered Nurse

## 2020-12-15 ENCOUNTER — Encounter: Payer: Self-pay | Admitting: Registered Nurse

## 2020-12-15 ENCOUNTER — Telehealth: Payer: Self-pay | Admitting: *Deleted

## 2020-12-15 ENCOUNTER — Other Ambulatory Visit: Payer: Self-pay

## 2020-12-15 VITALS — BP 133/82 | HR 98 | Temp 99.2°F | Ht 67.0 in | Wt 180.8 lb

## 2020-12-15 DIAGNOSIS — F3289 Other specified depressive episodes: Secondary | ICD-10-CM | POA: Diagnosis present

## 2020-12-15 DIAGNOSIS — M62838 Other muscle spasm: Secondary | ICD-10-CM | POA: Insufficient documentation

## 2020-12-15 DIAGNOSIS — G894 Chronic pain syndrome: Secondary | ICD-10-CM | POA: Insufficient documentation

## 2020-12-15 DIAGNOSIS — G90512 Complex regional pain syndrome I of left upper limb: Secondary | ICD-10-CM

## 2020-12-15 DIAGNOSIS — Z79891 Long term (current) use of opiate analgesic: Secondary | ICD-10-CM | POA: Insufficient documentation

## 2020-12-15 DIAGNOSIS — Z79899 Other long term (current) drug therapy: Secondary | ICD-10-CM | POA: Diagnosis present

## 2020-12-15 DIAGNOSIS — G4700 Insomnia, unspecified: Secondary | ICD-10-CM | POA: Insufficient documentation

## 2020-12-15 MED ORDER — HYDROCODONE-ACETAMINOPHEN 7.5-325 MG PO TABS: ORAL_TABLET | ORAL | 0 refills | Status: DC

## 2020-12-15 MED ORDER — TRAMADOL HCL ER 100 MG PO TB24: 100.0000 mg | ORAL_TABLET | Freq: Every day | ORAL | 2 refills | Status: DC

## 2020-12-15 MED ORDER — TRAMADOL HCL 50 MG PO TABS: ORAL_TABLET | ORAL | 2 refills | Status: DC

## 2020-12-15 NOTE — Telephone Encounter (Signed)
Mrs [name] needs an updated letter faxed to her attorneys for [employer].  Letter updated and will be faxed with Zella Ball visit note from today to [place], [place], & [place], [place] attn [place].  [phone].

## 2020-12-15 NOTE — Progress Notes (Signed)
Subjective:    Patient ID: [name], female    DOB: [birth year], 55 y.o.   MRN: [id number]  HPI: [name] is a 55 y.o. female who returns for follow up appointment for chronic pain and medication refill. She states her pain is located in her left arm and left wrist. She rates her pain 7. Her current exercise regime is walking.  [name] Morphine equivalent is 35.00 MME.    UDS ordered today.      Pain Inventory Average Pain 7 Pain Right Now 7 My pain is intermittent, constant, sharp, burning, dull, stabbing, tingling, aching and bone crushing.  In the last 24 hours, has pain interfered with the following? General activity 9 Relation with others 9 Enjoyment of life 9 What TIME of day is your pain at its worst? morning , daytime, evening and night Sleep (in general) Poor  Pain is worse with: walking, bending, sitting, inactivity, standing and some activites Pain improves with: rest, pacing activities, medication, TENS and heat Relief from Meds: 7  Family History  Problem Relation Age of Onset  . Cancer Mother        lung  . Hypertension Mother   . Cancer Father        colon  . Hypertension Sister   . Cancer Sister   . Cancer Brother   . Hyperlipidemia Sister   . Birth defects Maternal Grandmother        breast  . Breast cancer Maternal Grandmother   . Birth defects Paternal Grandmother        uterine, stomach, lung  . Breast cancer Paternal Grandmother    Social History   Socioeconomic History  . Marital status: Married    Spouse name: Not on file  . Number of children: 2  . Years of education: Not on file  . Highest education level: Not on file  Occupational History  . 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 Smoking status: Never Smoker  . Smokeless tobacco: Never Used  Vaping Use  . Vaping Use: Never used  Substance and Sexual Activity  . Alcohol use: Yes    Alcohol/week: 0.0 standard drinks    Comment: 01/24/2013  "drink or 2 once or /twice/year, if that"  . Drug use: No  . Sexual activity: Not Currently  Other Topics Concern  . Not on file  Social History Narrative   No living will   Would want husband as POA--then sister, Neoma Laming   Would accept resuscitation attempts   Probably would not want tube feeds if cognitively unaware   Social Determinants of Health   Financial Resource Strain: Not on file  Food Insecurity: Not on file  Transportation Needs: Not on file  Physical Activity: Not on file  Stress: Not on file  Social Connections: Not on file   Past Surgical History:  Procedure Laterality Date  . APPENDECTOMY  ~ 06/2007  . BLADDER REPAIR  ~ 06/2007   "same day after bladder lift" (01/24/2013)  . BLADDER SUSPENSION  ~ 06/2007  . BREAST BIOPSY Left   . DIAGNOSTIC LAPAROSCOPY  1990's & ~ 2000   "I've had a couple; for endometrosis" (01/24/2013)  . HERNIA REPAIR    . INCISIONAL HERNIA REPAIR N/A 01/24/2013   Procedure: LAPAROSCOPIC INCISIONAL HERNIA;  Surgeon: Harl Bowie, MD;  Location: [facility];  Service: General;  Laterality: N/A;  . INSERTION OF MESH N/A 01/24/2013   Procedure: INSERTION OF MESH;  Surgeon: Harl Bowie,  MD;  Location: [facility];  Service: General;  Laterality: N/A;  . LAPAROSCOPIC INCISIONAL / UMBILICAL / [place]  01/24/2013   IHR w/mesh/notes  . NASAL SEPTUM SURGERY  1980's?  . OOPHORECTOMY Right 2009  . TONSILLECTOMY  1990's  . VAGINAL HYSTERECTOMY  ` 06/2007   Past Surgical History:  Procedure Laterality Date  . APPENDECTOMY  ~ 06/2007  . BLADDER REPAIR  ~ 06/2007   "same day after bladder lift" (01/24/2013)  . BLADDER SUSPENSION  ~ 06/2007  . BREAST BIOPSY Left   . DIAGNOSTIC LAPAROSCOPY  1990's & ~ 2000   "I've had a couple; for endometrosis" (01/24/2013)  . HERNIA REPAIR    . INCISIONAL HERNIA REPAIR N/A 01/24/2013   Procedure: LAPAROSCOPIC INCISIONAL HERNIA;  Surgeon: Harl Bowie, MD;  Location: [facility];  Service: General;  Laterality: N/A;  .  INSERTION OF MESH N/A 01/24/2013   Procedure: INSERTION OF MESH;  Surgeon: Harl Bowie, MD;  Location: [facility];  Service: General;  Laterality: N/A;  . LAPAROSCOPIC INCISIONAL / UMBILICAL / [place]  01/24/2013   IHR w/mesh/notes  . NASAL SEPTUM SURGERY  1980's?  . OOPHORECTOMY Right 2009  . TONSILLECTOMY  1990's  . VAGINAL HYSTERECTOMY  ` 06/2007   Past Medical History:  Diagnosis Date  . Allergy   . Anxiety   . Arthritis    "just the norm" (01/24/2013)  . ASCVD (arteriosclerotic cardiovascular disease)   . Asthma   . Celiac disease   . Daily headache    "depends on the season" (01/24/2013)  . Fibromyalgia   . GERD (gastroesophageal reflux disease)   . History of colonic polyps   . Hyperlipidemia   . Hypertension   . Migraines   . Obesity (BMI 30.0-34.9)   . RSD (reflex sympathetic dystrophy)   . Sleep apnea    NO MACHINE RECOMMENDED  . TIA (transient ischemic attack) ~ 2009   BP 133/82   Pulse 98   Temp 99.2 F (37.3 C)   Ht 5' 7"  (1.702 m)   Wt 180 lb 12.8 oz (82 kg)   SpO2 97%   BMI 28.32 kg/m   Opioid Risk Score:   Fall Risk Score:  `1  Depression screen PHQ 2/9  Depression screen [facility] 2/9 12/15/2020 11/17/2020 09/16/2020 04/14/2020 02/26/2019 11/29/2018 09/07/2018  Decreased Interest 1 1 1 3 1 1 1   Down, Depressed, Hopeless 1 1 - 3 1 1 1   PHQ - 2 Score 2 2 1 6 2 2 2   Altered sleeping - - - 3 - - -  Tired, decreased energy - - - 3 - - -  Change in appetite - - - 1 - - -  Feeling bad or failure about yourself  - - - 3 - - -  Trouble concentrating - - - 3 - - -  Moving slowly or fidgety/restless - - - 3 - - -  Suicidal thoughts - - - 0 - - -  PHQ-9 Score - - - 22 - - -  Difficult doing work/chores - - - - - - -  Some recent data might be hidden   Review of Systems  Musculoskeletal:       Pain in left wrist & arm  All other systems reviewed and are negative.      Objective:   Physical Exam Vitals and nursing note reviewed.  Constitutional:       Appearance: Normal appearance.  Cardiovascular:  Rate and Rhythm: Normal rate and regular rhythm.     Pulses: Normal pulses.     Heart sounds: Normal heart sounds.  Pulmonary:     Effort: Pulmonary effort is normal.     Breath sounds: Normal breath sounds.  Musculoskeletal:     Cervical back: Normal range of motion and neck supple.     Comments: Normal Muscle Bulk and Muscle Testing Reveals:  Upper Extremities: Right: Full ROM and Muscle Strength 5/5 Left Upper Extremity: Decreased ROM 90 Degrees and Muscle Strength 2/5 Lower Extremities: Decreased ROM and Muscle Strength 4/5 Bilateral Lower Extremities Flexion Produces Pain into her Bilateral Lower Extremity and Bilateral Feet Arises from Table Slowly Antalgic  Gait   Skin:    General: Skin is warm and dry.  Neurological:     Mental Status: She is alert and oriented to person, place, and time.  Psychiatric:        Mood and Affect: Mood normal.        Behavior: Behavior normal.           Assessment & Plan:  1.Complex Regional Pain Syndrome Type 1: Continue Topamax and Cymbalta.12/15/2020. Refilled:Hydro-codone 7.5/325mg one tablet twice a day one in the morning and one at bedtime#60.ContinueTramadol 100 mg ER daily #30 and Tramadol 50 mg one tablet by mouth BID. One tablet in the afternoon and one tablet in the evening. We will continue the opioid monitoring program, this consists of regular clinic visits, examinations, urine drug screen, pill counts as well as use of [place] Controlled Substance Reporting system. A 12 month History has been reviewed on the [place] Controlled Substance Reporting SystemOn02/28/2022. 2. Depression: Continuecurrent medication regimen withCymbalta.12/15/2020 3. Insomnia: Continuecurrent medication regimen withTrazodoneone and half tablet at bedtime02/28/2022 4. Muscle Spasms: Continuecurrent medication regimen withTizanidine.12/15/2020 5. Chronic Pain  Syndrome: Continue IbuprofenBID as needed.12/15/2020

## 2020-12-23 ENCOUNTER — Telehealth: Payer: Self-pay | Admitting: *Deleted

## 2020-12-23 LAB — TOXASSURE SELECT,+ANTIDEPR,UR

## 2020-12-23 NOTE — Telephone Encounter (Signed)
Urine drug screen for this encounter is consistent for prescribed medication 

## 2021-01-12 ENCOUNTER — Encounter: Payer: Self-pay | Admitting: Registered Nurse

## 2021-01-12 ENCOUNTER — Encounter: Payer: Worker's Compensation | Attending: Registered Nurse | Admitting: Registered Nurse

## 2021-01-12 ENCOUNTER — Other Ambulatory Visit: Payer: Self-pay

## 2021-01-12 VITALS — BP 132/89 | HR 90 | Temp 98.8°F | Ht 67.0 in | Wt 184.0 lb

## 2021-01-12 DIAGNOSIS — Z5181 Encounter for therapeutic drug level monitoring: Secondary | ICD-10-CM

## 2021-01-12 DIAGNOSIS — M62838 Other muscle spasm: Secondary | ICD-10-CM

## 2021-01-12 DIAGNOSIS — Z79891 Long term (current) use of opiate analgesic: Secondary | ICD-10-CM | POA: Diagnosis present

## 2021-01-12 DIAGNOSIS — G894 Chronic pain syndrome: Secondary | ICD-10-CM

## 2021-01-12 DIAGNOSIS — F3289 Other specified depressive episodes: Secondary | ICD-10-CM

## 2021-01-12 DIAGNOSIS — G4700 Insomnia, unspecified: Secondary | ICD-10-CM | POA: Diagnosis not present

## 2021-01-12 DIAGNOSIS — G90512 Complex regional pain syndrome I of left upper limb: Secondary | ICD-10-CM | POA: Diagnosis not present

## 2021-01-12 MED ORDER — HYDROCODONE-ACETAMINOPHEN 7.5-325 MG PO TABS: ORAL_TABLET | ORAL | 0 refills | Status: DC

## 2021-01-12 MED ORDER — CYMBALTA 30 MG PO CPEP: 90.0000 mg | ORAL_CAPSULE | Freq: Every day | ORAL | 2 refills | Status: DC

## 2021-01-12 MED ORDER — TOPIRAMATE 100 MG PO TABS: ORAL_TABLET | ORAL | 5 refills | Status: DC

## 2021-01-12 MED ORDER — TIZANIDINE HCL 4 MG PO TABS: ORAL_TABLET | ORAL | 3 refills | Status: DC

## 2021-01-12 MED ORDER — TRAZODONE HCL 50 MG PO TABS: ORAL_TABLET | ORAL | 5 refills | Status: DC

## 2021-01-12 NOTE — Progress Notes (Signed)
Subjective:    Patient ID: [name], female    DOB: July 08, 1966, 55 y.o.   MRN: [id number]  HPI : [name] is a 55 y.o. female who returns for follow up appointment for chronic pain and medication refill. She states her  pain is located in her left arm and left hand. She rates her pain 7. Her  current exercise regime is walking and performing stretching exercises.  [name] Morphine equivalent is 35.00 MME.  Last UDS was Performed on 12/15/2020, it was consistent.    Pain Inventory Average Pain 7 Pain Right Now 7 My pain is sharp, burning, dull, stabbing, tingling and aching  In the last 24 hours, has pain interfered with the following? General activity 5 Relation with others 5 Enjoyment of life 5 What TIME of day is your pain at its worst? morning , daytime, evening and night Sleep (in general) Poor  Pain is worse with: walking, bending, sitting, inactivity, standing and some activites Pain improves with: rest, heat/ice, medication and TENS Relief from Meds: 7  Family History  Problem Relation Age of Onset  . Cancer Mother        lung  . Hypertension Mother   . Cancer Father        colon  . Hypertension Sister   . Cancer Sister   . Cancer Brother   . Hyperlipidemia Sister   . Birth defects Maternal Grandmother        breast  . Breast cancer Maternal Grandmother   . Birth defects Paternal Grandmother        uterine, stomach, lung  . Breast cancer Paternal Grandmother    Social History   Socioeconomic History  . Marital status: Married    Spouse name: Not on file  . Number of children: 2  . Years of education: Not on file  . Highest education level: Not on file  Occupational History  . 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 Smoking status: Never Smoker  . Smokeless tobacco: Never Used  Vaping Use  . Vaping Use: Never used  Substance and Sexual Activity  . Alcohol use: Yes    Alcohol/week: 0.0 standard drinks     Comment: 01/24/2013 "drink or 2 once or /twice/year, if that"  . Drug use: No  . Sexual activity: Not Currently  Other Topics Concern  . Not on file  Social History Narrative   No living will   Would want husband as POA--then sister, [name]   Would accept resuscitation attempts   Probably would not want tube feeds if cognitively unaware   Social Determinants of Health   Financial Resource Strain: Not on file  Food Insecurity: Not on file  Transportation Needs: Not on file  Physical Activity: Not on file  Stress: Not on file  Social Connections: Not on file   Past Surgical History:  Procedure Laterality Date  . APPENDECTOMY  ~ 06/2007  . BLADDER REPAIR  ~ 06/2007   "same day after bladder lift" (01/24/2013)  . BLADDER SUSPENSION  ~ 06/2007  . BREAST BIOPSY Left   . DIAGNOSTIC LAPAROSCOPY  1990's & ~ 2000   "I've had a couple; for endometrosis" (01/24/2013)  . HERNIA REPAIR    . INCISIONAL HERNIA REPAIR N/A 01/24/2013   Procedure: LAPAROSCOPIC INCISIONAL HERNIA;  Surgeon: Harl Bowie, MD;  Location: [facility];  Service: General;  Laterality: N/A;  . INSERTION OF MESH N/A 01/24/2013   Procedure: INSERTION OF MESH;  Surgeon: Harl Bowie, MD;  Location: [facility];  Service: General;  Laterality: N/A;  . LAPAROSCOPIC INCISIONAL / UMBILICAL / [place]  01/24/2013   IHR w/mesh/notes  . NASAL SEPTUM SURGERY  1980's?  . OOPHORECTOMY Right 2009  . TONSILLECTOMY  1990's  . VAGINAL HYSTERECTOMY  ` 06/2007   Past Surgical History:  Procedure Laterality Date  . APPENDECTOMY  ~ 06/2007  . BLADDER REPAIR  ~ 06/2007   "same day after bladder lift" (01/24/2013)  . BLADDER SUSPENSION  ~ 06/2007  . BREAST BIOPSY Left   . DIAGNOSTIC LAPAROSCOPY  1990's & ~ 2000   "I've had a couple; for endometrosis" (01/24/2013)  . HERNIA REPAIR    . INCISIONAL HERNIA REPAIR N/A 01/24/2013   Procedure: LAPAROSCOPIC INCISIONAL HERNIA;  Surgeon: Harl Bowie, MD;  Location: [facility];  Service: General;   Laterality: N/A;  . INSERTION OF MESH N/A 01/24/2013   Procedure: INSERTION OF MESH;  Surgeon: Harl Bowie, MD;  Location: [facility];  Service: General;  Laterality: N/A;  . LAPAROSCOPIC INCISIONAL / UMBILICAL / [place]  01/24/2013   IHR w/mesh/notes  . NASAL SEPTUM SURGERY  1980's?  . OOPHORECTOMY Right 2009  . TONSILLECTOMY  1990's  . VAGINAL HYSTERECTOMY  ` 06/2007   Past Medical History:  Diagnosis Date  . Allergy   . Anxiety   . Arthritis    "just the norm" (01/24/2013)  . ASCVD (arteriosclerotic cardiovascular disease)   . Asthma   . Celiac disease   . Daily headache    "depends on the season" (01/24/2013)  . Fibromyalgia   . GERD (gastroesophageal reflux disease)   . History of colonic polyps   . Hyperlipidemia   . Hypertension   . Migraines   . Obesity (BMI 30.0-34.9)   . RSD (reflex sympathetic dystrophy)   . Sleep apnea    NO MACHINE RECOMMENDED  . TIA (transient ischemic attack) ~ 2009   BP 132/89   Pulse 90   Temp 98.8 F (37.1 C)   Ht 5' 7"  (1.702 m)   Wt 184 lb (83.5 kg)   SpO2 96%   BMI 28.82 kg/m   Opioid Risk Score:   Fall Risk Score:  `1  Depression screen PHQ 2/9  Depression screen [facility] 2/9 12/15/2020 11/17/2020 09/16/2020 04/14/2020 02/26/2019 11/29/2018 09/07/2018  Decreased Interest 1 1 1 3 1 1 1   Down, Depressed, Hopeless 1 1 - 3 1 1 1   PHQ - 2 Score 2 2 1 6 2 2 2   Altered sleeping - - - 3 - - -  Tired, decreased energy - - - 3 - - -  Change in appetite - - - 1 - - -  Feeling bad or failure about yourself  - - - 3 - - -  Trouble concentrating - - - 3 - - -  Moving slowly or fidgety/restless - - - 3 - - -  Suicidal thoughts - - - 0 - - -  PHQ-9 Score - - - 22 - - -  Difficult doing work/chores - - - - - - -  Some recent data might be hidden     Review of Systems  Musculoskeletal:       Arm pain  All other systems reviewed and are negative.      Objective:   Physical Exam Vitals and nursing note reviewed.   Constitutional:      Appearance: Normal appearance.  Neck:  Comments: Cervical Paraspinal Tenderness: C-5-C-6 Cardiovascular:     Rate and Rhythm: Normal rate and regular rhythm.     Pulses: Normal pulses.     Heart sounds: Normal heart sounds.  Pulmonary:     Effort: Pulmonary effort is normal.     Breath sounds: Normal breath sounds.  Musculoskeletal:     Cervical back: Normal range of motion and neck supple.     Comments: Normal Muscle Bulk and Muscle Testing Reveals:  Upper Extremities: Right: Full ROM and Muscle Strength 5/5 Left Upper Extremity: Decreased ROM 90 Degrees and Muscle Strength 3/5  Thoracic Paraspinal Tenderness: T-7-T-9  Lower Extremities: Full ROM and Muscle Strength 5/5 Bilateral Lower Extremities Flexion Produces Pain into her Bilateral Lower Extremities and Bilateral Feet Arises from Table slowly Narrow Based  Gait   Skin:    General: Skin is warm and dry.  Neurological:     Mental Status: She is alert and oriented to person, place, and time.  Psychiatric:        Mood and Affect: Mood normal.        Behavior: Behavior normal.           Assessment & Plan:  1.Complex Regional Pain Syndrome Type 1: Continue Topamax and Cymbalta.01/12/2021. Refilled:Hydro-codone 7.5/325mg one tablet twice a day one in the morning and one at bedtime#60.ContinueTramadol 100 mg ER daily #30 and Tramadol 50 mg one tablet by mouth BID. One tablet in the afternoon and one tablet in the evening. We will continue the opioid monitoring program, this consists of regular clinic visits, examinations, urine drug screen, pill counts as well as use of [place] Controlled Substance Reporting system. A 12 month History has been reviewed on the [place] Controlled Substance Reporting SystemOn03/28/2022. 2. Depression: Continuecurrent medication regimen withCymbalta.01/12/2021 3. Insomnia: Continuecurrent medication regimen withTrazodoneone and half tablet at  bedtime03/28/2022 4. Muscle Spasms: Continuecurrent medication regimen withTizanidine.01/12/2021 5. Chronic Pain Syndrome: Continue IbuprofenBID as needed.01/12/2021  F/U in 1 month

## 2021-01-30 ENCOUNTER — Other Ambulatory Visit: Payer: Self-pay | Admitting: Internal Medicine

## 2021-01-30 ENCOUNTER — Other Ambulatory Visit: Payer: Self-pay | Admitting: Registered Nurse

## 2021-02-09 ENCOUNTER — Emergency Department (HOSPITAL_COMMUNITY)
Admission: EM | Admit: 2021-02-09 | Discharge: 2021-02-09 | Disposition: A | Discharge status: Home / Self Care | Payer: Medicare HMO | Attending: Emergency Medicine | Admitting: Emergency Medicine

## 2021-02-09 ENCOUNTER — Emergency Department (HOSPITAL_COMMUNITY): Payer: Medicare HMO

## 2021-02-09 ENCOUNTER — Other Ambulatory Visit: Payer: Self-pay

## 2021-02-09 DIAGNOSIS — R0789 Other chest pain: Secondary | ICD-10-CM | POA: Insufficient documentation

## 2021-02-09 DIAGNOSIS — Z79899 Other long term (current) drug therapy: Secondary | ICD-10-CM | POA: Diagnosis not present

## 2021-02-09 DIAGNOSIS — J4520 Mild intermittent asthma, uncomplicated: Secondary | ICD-10-CM | POA: Insufficient documentation

## 2021-02-09 DIAGNOSIS — R1013 Epigastric pain: Secondary | ICD-10-CM | POA: Insufficient documentation

## 2021-02-09 DIAGNOSIS — R42 Dizziness and giddiness: Secondary | ICD-10-CM | POA: Diagnosis not present

## 2021-02-09 DIAGNOSIS — H748X2 Other specified disorders of left middle ear and mastoid: Secondary | ICD-10-CM | POA: Insufficient documentation

## 2021-02-09 DIAGNOSIS — I10 Essential (primary) hypertension: Secondary | ICD-10-CM | POA: Diagnosis not present

## 2021-02-09 DIAGNOSIS — Z8673 Personal history of transient ischemic attack (TIA), and cerebral infarction without residual deficits: Secondary | ICD-10-CM | POA: Diagnosis not present

## 2021-02-09 DIAGNOSIS — R079 Chest pain, unspecified: Secondary | ICD-10-CM | POA: Diagnosis not present

## 2021-02-09 LAB — BASIC METABOLIC PANEL
Anion gap: 8 (ref 5–15)
BUN: 16 mg/dL (ref 6–20)
CO2: 23 mmol/L (ref 22–32)
Calcium: 9.5 mg/dL (ref 8.9–10.3)
Chloride: 109 mmol/L (ref 98–111)
Creatinine, Ser: 0.87 mg/dL (ref 0.44–1.00)
GFR, Estimated: >60 mL/min (ref >60)
Glucose, Bld: 131 mg/dL — ABNORMAL HIGH (ref 70–99)
Potassium: 4.1 mmol/L (ref 3.5–5.1)
Sodium: 140 mmol/L (ref 135–145)

## 2021-02-09 LAB — CBC
HCT: 43.3 % (ref 36.0–46.0)
Hemoglobin: 13.8 g/dL (ref 12.0–15.0)
MCH: 29.2 pg (ref 26.0–34.0)
MCHC: 31.9 g/dL (ref 30.0–36.0)
MCV: 91.7 fL (ref 80.0–100.0)
Platelets: 269 10*3/uL (ref 150–400)
RBC: 4.72 MIL/uL (ref 3.87–5.11)
RDW: 13.5 % (ref 11.5–15.5)
WBC: 10.4 10*3/uL (ref 4.0–10.5)
nRBC: 0 % (ref 0.0–0.2)

## 2021-02-09 LAB — TROPONIN I (HIGH SENSITIVITY)
Troponin I (High Sensitivity): 5 ng/L (ref <18)
Troponin I (High Sensitivity): 4 ng/L (ref <18)

## 2021-02-09 MED ORDER — ALBUTEROL SULFATE (2.5 MG/3ML) 0.083% IN NEBU
5.0000 mg | INHALATION_SOLUTION | Freq: Once | RESPIRATORY_TRACT | Status: AC
  Administered 2021-02-09: 5 mg via RESPIRATORY_TRACT
  Filled 2021-02-09: qty 6

## 2021-02-09 MED ORDER — MECLIZINE HCL 25 MG PO TABS
50.0000 mg | ORAL_TABLET | Freq: Once | ORAL | Status: AC
  Administered 2021-02-09: 50 mg via ORAL
  Filled 2021-02-09: qty 2

## 2021-02-09 MED ORDER — MECLIZINE HCL 25 MG PO TABS: 25.0000 mg | ORAL_TABLET | Freq: Three times a day (TID) | ORAL | 0 refills | Status: DC | PRN

## 2021-02-09 NOTE — ED Triage Notes (Signed)
Emergency Medicine Provider Triage Evaluation Note  [name] , a 55 y.o. female  was evaluated in triage.  Pt complains of dizziness that started yesterday associated with chest tightness that started today. Dizziness worse with head movements. History of vertigo which feels similar to this. She notes she developed substernal chest pressure today which prompted her to report to UC and then was sent to the ED for further evaluation. No previous MI. No tobacco use.  Review of Systems  Positive: Chest pain, SOB, dizziness Negative: fever  Physical Exam  BP (!) 147/88 (BP Location: Left Arm)   Pulse 82   Temp 98.7 F (37.1 C) (Oral)   Resp 15   SpO2 98%  Gen:   Awake, no distress   HEENT:  Atraumatic  Resp:  Normal effort  Cardiac:  Normal rate  Abd:   Nondistended, nontender  MSK:   Moves extremities without difficulty  Neuro:  Speech clear   Medical Decision Making  Medically screening exam initiated at 4:25 PM.  Appropriate orders placed.  [name] was informed that the remainder of the evaluation will be completed by another provider, this initial triage assessment does not replace that evaluation, and the importance of remaining in the ED until their evaluation is complete.  Clinical Impression  Chest pain, shortness of breath, and dizziness. Cardiac labs ordered to rule out cardiac etiology.   [name], [place] 02/09/21 1629

## 2021-02-09 NOTE — Discharge Instructions (Signed)
Please read and follow all provided instructions.  Your diagnoses today include:  1. Vertigo   2. Epigastric discomfort     Tests performed today include:  An EKG of your heart  A chest x-ray  Cardiac enzymes - a blood test for heart muscle damage, both were normal  Blood counts and electrolytes  Vital signs. See below for your results today.   Medications prescribed:   Meclizine -medication to help with dizziness  Take any prescribed medications only as directed.  Follow-up instructions: Please follow-up with your primary care provider as soon as you can for further evaluation of your symptoms.   Return instructions:  SEEK IMMEDIATE MEDICAL ATTENTION IF:  You have severe chest pain, especially if the pain is crushing or pressure-like and spreads to the arms, back, neck, or jaw, or if you have sweating, nausea (feeling sick to your stomach), or shortness of breath. THIS IS AN EMERGENCY. Don't wait to see if the pain will go away. Get medical help at once. Call 911 or 0 (operator). DO NOT drive yourself to the hospital.   Your chest pain gets worse and does not go away with rest.   You have an attack of chest pain lasting longer than usual, despite rest and treatment with the medications your caregiver has prescribed.   You wake from sleep with chest pain or shortness of breath.  You feel dizzy or faint.  You have chest pain not typical of your usual pain for which you originally saw your caregiver.   Return if you have weakness in your arms or legs, slurred speech, trouble walking or talking, confusion, or trouble with your balance.   You have any other emergent concerns regarding your health.  Additional Information: Chest pain comes from many different causes. Your caregiver has diagnosed you as having chest pain that is not specific for one problem, but does not require admission.  You are at low risk for an acute heart condition or other serious illness.   Your  vital signs today were: BP 139/87   Pulse 82   Temp 98.7 F (37.1 C) (Oral)   Resp 18   SpO2 90%  If your blood pressure (BP) was elevated above 135/85 this visit, please have this repeated by your doctor within one month. --------------

## 2021-02-09 NOTE — ED Triage Notes (Signed)
Pt from UC for further eval of dizziness and shortness of breath that started yesterday, and central chest tightness that started today, which has progressed/radiated to neck and upper back. All symptoms worse with movement/exertion. UC noted some EKG changes, so sent here for further workup of cardiac issue vs. Vertigo.

## 2021-02-09 NOTE — ED Notes (Signed)
Pt remained in 90% O2 stats. Pt HR went up to 112.

## 2021-02-09 NOTE — ED Provider Notes (Signed)
[facility] EMERGENCY DEPARTMENT Provider Note   CSN: [id number] Arrival date & time: 02/09/21  1611     History Chief Complaint  Patient presents with  . Chest Pain  . Shortness of Breath  . Dizziness    [name] is a 55 y.o. female.  Patient with history of asthma, remote history of vertigo --presents the emergency department today for evaluation of dizziness and chest tightness.  Patient has a history of high blood pressure but no high cholesterol, diabetes, tobacco use, or first-degree family history of heart disease.  Patient states that yesterday she developed vertigo symptoms.  She describes this as a spinning sensation that was worse with movement of her head.  Symptoms did not completely resolve but were much less when she was still.  No associated headache or vomiting.  This morning she woke up around 9 AM and had associated chest tightness.  This occurred at rest.  No associated vomiting or diaphoresis.  It worried her enough where she went to urgent care who then referred her to the ED for cardiac evaluation.  Apparently she had some nonspecific changes on EKG at urgent care.  Patient denies signs of stroke including: facial droop, slurred speech, aphasia, weakness/numbness in extremities.  Vertigo symptoms are improved today but not resolved.  No treatments for this prior to arrival.  She has not eaten or had anything to drink today but this feels different than her occasional GERD. Patient denies risk factors for pulmonary embolism including: unilateral leg swelling, history of DVT/PE/other blood clots, use of exogenous hormones, recent immobilizations, recent surgery, recent travel (>4hr segment), malignancy, hemoptysis.           Past Medical History:  Diagnosis Date  . Allergy   . Anxiety   . Arthritis    "just the norm" (01/24/2013)  . ASCVD (arteriosclerotic cardiovascular disease)   . Asthma   . Celiac disease   . Daily headache     "depends on the season" (01/24/2013)  . Fibromyalgia   . GERD (gastroesophageal reflux disease)   . History of colonic polyps   . Hyperlipidemia   . Hypertension   . Migraines   . Obesity (BMI 30.0-34.9)   . RSD (reflex sympathetic dystrophy)   . Sleep apnea    NO MACHINE RECOMMENDED  . TIA (transient ischemic attack) ~ 2009    Patient Active Problem List   Diagnosis Date Noted  . Prediabetes 05/22/2019  . Chronic narcotic dependence ([place]) 05/22/2019  . Celiac disease   . Advance directive discussed with patient 05/02/2017  . RSD upper limb 01/27/2015  . Reflex sympathetic dystrophy of lower extremity 09/26/2014  . Swelling of limb 04/18/2014  . Peripheral autonomic neuropathy 08/03/2013  . Urinary incontinence 04/19/2013  . Routine general medical examination at a health care facility 08/02/2011  . HYPERLIPIDEMIA 12/21/2007  . MIGRAINE VARIANT 12/21/2007  . Essential hypertension, benign 06/30/2007  . ALLERGIC RHINITIS 06/30/2007  . Mood disorder ([place]) 03/24/2007  . RSD lower limb 03/24/2007  . Mild intermittent asthma in adult without complication 68/05/8109  . GERD 03/24/2007  . SLEEP APNEA, MILD 03/24/2007  . ALLERGY, ENVIRONMENTAL 03/24/2007  . COLONIC POLYPS, HX OF 03/24/2007    Past Surgical History:  Procedure Laterality Date  . APPENDECTOMY  ~ 06/2007  . BLADDER REPAIR  ~ 06/2007   "same day after bladder lift" (01/24/2013)  . BLADDER SUSPENSION  ~ 06/2007  . BREAST BIOPSY Left   . DIAGNOSTIC LAPAROSCOPY  1990's & ~  2000   "I've had a couple; for endometrosis" (01/24/2013)  . HERNIA REPAIR    . INCISIONAL HERNIA REPAIR N/A 01/24/2013   Procedure: LAPAROSCOPIC INCISIONAL HERNIA;  Surgeon: Harl Bowie, MD;  Location: [facility];  Service: General;  Laterality: N/A;  . INSERTION OF MESH N/A 01/24/2013   Procedure: INSERTION OF MESH;  Surgeon: Harl Bowie, MD;  Location: [facility];  Service: General;  Laterality: N/A;  . LAPAROSCOPIC INCISIONAL / UMBILICAL / [place]  01/24/2013   IHR w/mesh/notes  . NASAL SEPTUM SURGERY  1980's?  . OOPHORECTOMY Right 2009  . TONSILLECTOMY  1990's  . VAGINAL HYSTERECTOMY  ` 06/2007     OB History   No obstetric history on file.     Family History  Problem Relation Age of Onset  . Cancer Mother        lung  . Hypertension Mother   . Cancer Father        colon  . Hypertension Sister   . Cancer Sister   . Cancer Brother   . Hyperlipidemia Sister   . Birth defects Maternal Grandmother        breast  . Breast cancer Maternal Grandmother   . Birth defects Paternal Grandmother        uterine, stomach, lung  . Breast cancer Paternal Grandmother     Social History   Tobacco Use  . Smoking status: Never Smoker  . Smokeless tobacco: Never Used  Vaping Use  . Vaping Use: Never used  Substance Use Topics  . Alcohol use: Yes    Alcohol/week: 0.0 standard drinks    Comment: 01/24/2013 "drink or 2 once or /twice/year, if that"  . Drug use: No    Home Medications Prior to Admission medications   Medication Sig Start Date End Date Taking? Authorizing Provider  albuterol (VENTOLIN HFA) 108 (90 Base) MCG/ACT inhaler Inhale 2 puffs into the lungs every 6 (six) hours as needed for wheezing or shortness of breath. 05/22/19   Venia Carbon, MD  clobetasol cream (TEMOVATE) 6.54 % Apply 1 application topically 2 (two) times daily. 04/25/15   Bedsole, Amy E, MD  CYMBALTA 30 MG capsule Take 3 capsules (90 mg total) by mouth daily. 01/12/21   Bayard Hugger, NP  esomeprazole (NEXIUM) 40 MG capsule TAKE 1 CAPSULE (40 MG TOTAL) BY MOUTH 2 (TWO) TIMES DAILY. QTY/DAY SUPPLY PER INS 06/18/20   Venia Carbon, MD  HYDROcodone-acetaminophen (NORCO) 7.5-325 MG tablet TAKE 1 TABLET Twice a Day. One in the Morning and one at Bedtime 01/12/21   Danella Sensing L, NP  ibuprofen (ADVIL) 600 MG tablet TAKE 1 TABLET (600 MG TOTAL) BY MOUTH EVERY 8 (EIGHT) HOURS AS NEEDED FOR MILD PAIN. 02/02/21   Bayard Hugger, NP   ketoconazole (NIZORAL) 2 % cream Apply 1 application topically daily. 05/22/19   Venia Carbon, MD  loratadine (CLARITIN) 10 MG tablet Take 10 mg by mouth daily.    [provider]  metoCLOPramide (REGLAN) 10 MG tablet Take 10 mg by mouth 2 (two) times daily.    [provider]  montelukast (SINGULAIR) 10 MG tablet TAKE 1 TABLET BY MOUTH EVERYDAY AT BEDTIME 02/02/21   Viviana Simpler I, MD  olmesartan (BENICAR) 40 MG tablet TAKE 1 TABLET BY MOUTH EVERY DAY 02/02/21   Venia Carbon, MD  SYMBICORT 160-4.5 MCG/ACT inhaler Inhale into the lungs. 07/31/20   [provider]  tiZANidine (ZANAFLEX) 4 MG tablet TAKE  1 TABLET (4 MG TOTAL) BY MOUTH 4 (FOUR) TIMES DAILY. 01/12/21   Bayard Hugger, NP  topiramate (TOPAMAX) 100 MG tablet One tablet in the morning and two tablets in the evening. 01/12/21   Bayard Hugger, NP  traMADol (ULTRAM) 50 MG tablet TAKE 1 TABLET BY MOUTH TWICE A DAY. DIRECTION CHANGE. 12/15/20   Bayard Hugger, NP  traMADol (ULTRAM-ER) 100 MG 24 hr tablet Take 1 tablet (100 mg total) by mouth daily. 12/15/20   Bayard Hugger, NP  traZODone (DESYREL) 50 MG tablet Take one and half tablet at bedtime 01/12/21   Bayard Hugger, NP  losartan (COZAAR) 100 MG tablet TAKE 1 TABLET BY MOUTH EVERY DAY 04/26/19 04/28/19  Venia Carbon, MD    Allergies    Demerol [meperidine], Cymbalta [duloxetine hcl], Diazepam, Meperidine hcl, and Other  Review of Systems   Review of Systems  Constitutional: Negative for diaphoresis and fever.  HENT: Positive for ear pain (fullness, left ear).   Eyes: Negative for redness.  Respiratory: Negative for cough and shortness of breath.   Cardiovascular: Positive for chest pain. Negative for palpitations and leg swelling.  Gastrointestinal: Negative for abdominal pain, nausea and vomiting.  Genitourinary: Negative for dysuria.  Musculoskeletal: Negative for back pain and neck pain.  Skin: Negative for rash.  Neurological:  Positive for dizziness and headaches (mild). Negative for syncope, facial asymmetry, speech difficulty, weakness, light-headedness and numbness.  Psychiatric/Behavioral: The patient is not nervous/anxious.     Physical Exam Updated Vital Signs BP 139/87   Pulse 82   Temp 98.7 F (37.1 C) (Oral)   Resp 18   SpO2 90%   Physical Exam Vitals and nursing note reviewed.  Constitutional:      Appearance: She is well-developed. She is not diaphoretic.  HENT:     Head: Normocephalic and atraumatic.     Right Ear: Tympanic membrane, ear canal and external ear normal. No middle ear effusion. Tympanic membrane is not perforated, erythematous, retracted or bulging.     Left Ear: Ear canal and external ear normal. A middle ear effusion is present. Tympanic membrane is bulging. Tympanic membrane is not perforated, erythematous or retracted.     Nose: Nose normal.     Mouth/Throat:     Mouth: Mucous membranes are not dry.     Pharynx: Uvula midline.  Eyes:     General: Lids are normal.     Extraocular Movements:     Right eye: No nystagmus.     Left eye: No nystagmus.     Conjunctiva/sclera: Conjunctivae normal.     Pupils: Pupils are equal, round, and reactive to light.  Neck:     Vascular: Normal carotid pulses. No carotid bruit or JVD.     Trachea: Trachea normal. No tracheal deviation.  Cardiovascular:     Rate and Rhythm: Normal rate and regular rhythm.     Pulses: No decreased pulses.     Heart sounds: Normal heart sounds, S1 normal and S2 normal. No murmur heard.   Pulmonary:     Effort: Pulmonary effort is normal. No respiratory distress.     Breath sounds: Normal breath sounds. No wheezing.  Chest:     Chest wall: No tenderness.  Abdominal:     General: Bowel sounds are normal.     Palpations: Abdomen is soft.     Tenderness: There is no abdominal tenderness. There is no guarding or rebound.  Musculoskeletal:  General: Normal range of motion.     Cervical back:  Normal range of motion and neck supple. No tenderness or bony tenderness. No muscular tenderness.     Right lower leg: No tenderness. No edema.     Left lower leg: No tenderness. No edema.  Skin:    General: Skin is warm and dry.     Coloration: Skin is not pale.  Neurological:     Mental Status: She is alert and oriented to person, place, and time.     GCS: GCS eye subscore is 4. GCS verbal subscore is 5. GCS motor subscore is 6.     Cranial Nerves: No cranial nerve deficit.     Sensory: No sensory deficit.     Coordination: Coordination normal.     Gait: Gait normal.     Deep Tendon Reflexes: Reflexes are normal and symmetric.     ED Results / Procedures / Treatments   Labs (all labs ordered are listed, but only abnormal results are displayed) Labs Reviewed  BASIC METABOLIC PANEL - Abnormal; Notable for the following components:      Result Value   Glucose, Bld 131 (*)    All other components within normal limits  CBC  TROPONIN I (HIGH SENSITIVITY)  TROPONIN I (HIGH SENSITIVITY)    EKG EKG Interpretation  Date/Time:  Monday February 09 2021 16:19:16 EDT Ventricular Rate:  80 PR Interval:  160 QRS Duration: 104 QT Interval:  390 QTC Calculation: 449 R Axis:   51 Text Interpretation: Normal sinus rhythm Incomplete right bundle branch block Nonspecific T wave abnormality Abnormal ECG Confirmed by Thamas Jaegers ([id number]) on 02/09/2021 7:16:41 PM   Radiology DG Chest 2 View  Result Date: 02/09/2021 CLINICAL DATA:  Chest pain. EXAM: CHEST - 2 VIEW COMPARISON:  November 19, 2014. FINDINGS: The heart size and mediastinal contours are within normal limits. Both lungs are clear. The visualized skeletal structures are unremarkable. IMPRESSION: No active cardiopulmonary disease. Electronically Signed   By: Marijo Conception M.D.   On: 02/09/2021 16:53    Procedures Procedures   Medications Ordered in ED Medications  albuterol (PROVENTIL) (2.5 MG/3ML) 0.083% nebulizer solution 5 mg  (has no administration in time range)  meclizine (ANTIVERT) tablet 50 mg (50 mg Oral Given 02/09/21 1954)    ED Course  I have reviewed the triage vital signs and the nursing notes.  Pertinent labs & imaging results that were available during my care of the patient were reviewed by me and considered in my medical decision making (see chart for details).  Patient seen and examined. Work-up initiated. Medications ordered.  EKG reviewed, very similar to EKG in 2014 and 2012 with anterior T wave inversion or flattening.  Vital signs reviewed and are as follows: BP 139/87   Pulse 82   Temp 98.7 F (37.1 C) (Oral)   Resp 18   SpO2 90%   Oxygen saturation is borderline and will have patient ambulate.  Will check check and troponin to give meclizine and reassess.  9:29 PM 2nd trop neg. Pt ambulated at 90% with HR into 100's. Pt sitting in bed at 96% on room air. States she ambulated well, felt a little dizzy, but much better than yesterday. Continues to have minimal epigastric tightness. We discussed that history and work-up suggests low risk for ACS, PE.  Possible etiologies of her symptoms include gastritis, PUD, GERD, MSK pain.  Low suspicion for posterior circulation stroke as cause of vertigo.  Will be given  prescription for meclizine.  Patient is comfortable with discharge home.  She is willing to follow-up with Dr. Silvio Pate.  We discussed signs symptoms which should cause her to return and she is willing to return if things change. Patient counseled to return if they have weakness in their arms or legs, slurred speech, worseing trouble walking, trouble talking, confusion, worsening trouble with their balance, or if they have any other concerns. Patient was counseled to return with severe chest pain, especially if the pain is crushing or pressure-like and spreads to the arms, back, neck, or jaw, or if they have sweating, nausea, or shortness of breath with the pain. They were encouraged to call 911  with these symptoms. She verbalized understanding and agreed.     MDM Rules/Calculators/A&P                          Vertigo: Started yesterday, improving today.  Definitely worse with head movement in different positions.  Patient does have a middle ear effusion on the left.  No other neurologic symptoms and normal, reassuring neuro exam here.  Features of history and exam strongly suspicious for peripheral vertigo.  Do not feel that the patient requires stroke evaluation with MRI.  Chest tightness: Unchanged EKG, troponin negative x2, chest x-ray clear.  No diaphoresis, exertional pain, vomiting.  Pain does not radiate.  Mostly epigastric with some radiation to the mid chest.  Symptoms are atypical for ACS.  Heart score equals 3.  No red flags for PE and I feel that this is less likely and symptoms are very atypical for PE.  Patient comfortable with symptomatic treatment, PCP follow-up.  We discussed signs and symptoms to return as above and she seems reliable to do so things change.  Plan for discharge.   Final Clinical Impression(s) / ED Diagnoses Final diagnoses:  Vertigo  Epigastric discomfort    Rx / DC Orders ED Discharge Orders         Ordered    meclizine (ANTIVERT) 25 MG tablet  3 times daily PRN        02/09/21 2148           Carlisle Cater, Hershal Coria 02/09/21 2154    Luna Fuse, MD 02/09/21 2240

## 2021-02-10 ENCOUNTER — Telehealth: Payer: Self-pay

## 2021-02-10 NOTE — Telephone Encounter (Signed)
Spoke to pt to see how she is doing after her recent visit to the ER for dizziness and chest pain. She had not picked up the meclizine but will go get that today. She stated she did not feel too much of an improvement. She will get the meclizine and if if she is not improving in the next day or so, she will call for an OV.

## 2021-02-12 ENCOUNTER — Other Ambulatory Visit: Payer: Self-pay

## 2021-02-12 ENCOUNTER — Encounter: Payer: Self-pay | Admitting: Registered Nurse

## 2021-02-12 ENCOUNTER — Encounter: Payer: Worker's Compensation | Attending: Registered Nurse | Admitting: Registered Nurse

## 2021-02-12 VITALS — BP 127/80 | HR 85 | Temp 99.0°F | Ht 67.0 in | Wt 185.6 lb

## 2021-02-12 DIAGNOSIS — G894 Chronic pain syndrome: Secondary | ICD-10-CM | POA: Insufficient documentation

## 2021-02-12 DIAGNOSIS — F3289 Other specified depressive episodes: Secondary | ICD-10-CM | POA: Diagnosis not present

## 2021-02-12 DIAGNOSIS — G90512 Complex regional pain syndrome I of left upper limb: Secondary | ICD-10-CM | POA: Insufficient documentation

## 2021-02-12 DIAGNOSIS — M62838 Other muscle spasm: Secondary | ICD-10-CM | POA: Insufficient documentation

## 2021-02-12 DIAGNOSIS — Z79891 Long term (current) use of opiate analgesic: Secondary | ICD-10-CM | POA: Insufficient documentation

## 2021-02-12 DIAGNOSIS — Z5181 Encounter for therapeutic drug level monitoring: Secondary | ICD-10-CM | POA: Diagnosis present

## 2021-02-12 DIAGNOSIS — R0789 Other chest pain: Secondary | ICD-10-CM | POA: Insufficient documentation

## 2021-02-12 DIAGNOSIS — G4700 Insomnia, unspecified: Secondary | ICD-10-CM | POA: Insufficient documentation

## 2021-02-12 MED ORDER — TRAMADOL HCL 50 MG PO TABS: ORAL_TABLET | ORAL | 2 refills | Status: DC

## 2021-02-12 MED ORDER — TRAMADOL HCL ER 100 MG PO TB24: 100.0000 mg | ORAL_TABLET | Freq: Every day | ORAL | 2 refills | Status: DC

## 2021-02-12 MED ORDER — HYDROCODONE-ACETAMINOPHEN 7.5-325 MG PO TABS: ORAL_TABLET | ORAL | 0 refills | Status: DC

## 2021-02-12 NOTE — Progress Notes (Signed)
Subjective:    Patient ID: [name], female    DOB: October 03, 1966, 55 y.o.   MRN: [id number]  HPI: [name] is a 55 y.o. female who returns for follow up appointment for chronic pain and medication refill. She states she has chest tightness and SOB, states she went to the emergency room for the above on  02/09/2021. Discharge summary was reviewed, [name] was encouraged to go to the emergency room and she refused . She was encouraged to F/U with her PCP she verbalizes understanding. Educated on chest pain symptoms, she verbalizes understanding. [name] refuses to go to the emergency room today.   She states her  pain is located in her left wrist and left hand. She  rates her pain 8. Her current exercise regime is walking short distances.  [name] Morphine equivalent is 35.00 MME.    Last UDS was Performed on 12/15/2020, it was consistent.   Pain Inventory Average Pain 8 Pain Right Now 8 My pain is constant, sharp, burning, stabbing, tingling and aching  In the last 24 hours, has pain interfered with the following? General activity 3 Relation with others 3 Enjoyment of life 3 What TIME of day is your pain at its worst? daytime, evening and night Sleep (in general) Poor  Pain is worse with: walking, bending, sitting, standing and some activites Pain improves with: rest, heat/ice, pacing activities and medication Relief from Meds: 7  Family History  Problem Relation Age of Onset  . Cancer Mother        lung  . Hypertension Mother   . Cancer Father        colon  . Hypertension Sister   . Cancer Sister   . Cancer Brother   . Hyperlipidemia Sister   . Birth defects Maternal Grandmother        breast  . Breast cancer Maternal Grandmother   . Birth defects Paternal Grandmother        uterine, stomach, lung  . Breast cancer Paternal Grandmother    Social History   Socioeconomic History  . Marital status: Married    Spouse name:  Not on file  . Number of children: 2  . Years of education: Not on file  . Highest education level: Not on file  Occupational History  . 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 Smoking status: Never Smoker  . Smokeless tobacco: Never Used  Vaping Use  . Vaping Use: Never used  Substance and Sexual Activity  . Alcohol use: Yes    Alcohol/week: 0.0 standard drinks    Comment: 01/24/2013 "drink or 2 once or /twice/year, if that"  . Drug use: No  . Sexual activity: Not Currently  Other Topics Concern  . Not on file  Social History Narrative   No living will   Would want husband as POA--then sister, Neoma Laming   Would accept resuscitation attempts   Probably would not want tube feeds if cognitively unaware   Social Determinants of Health   Financial Resource Strain: Not on file  Food Insecurity: Not on file  Transportation Needs: Not on file  Physical Activity: Not on file  Stress: Not on file  Social Connections: Not on file   Past Surgical History:  Procedure Laterality Date  . APPENDECTOMY  ~ 06/2007  . BLADDER REPAIR  ~ 06/2007   "same day after bladder lift" (01/24/2013)  . BLADDER SUSPENSION  ~ 06/2007  . BREAST BIOPSY Left   .  DIAGNOSTIC LAPAROSCOPY  1990's & ~ 2000   "I've had a couple; for endometrosis" (01/24/2013)  . HERNIA REPAIR    . INCISIONAL HERNIA REPAIR N/A 01/24/2013   Procedure: LAPAROSCOPIC INCISIONAL HERNIA;  Surgeon: Harl Bowie, MD;  Location: [facility];  Service: General;  Laterality: N/A;  . INSERTION OF MESH N/A 01/24/2013   Procedure: INSERTION OF MESH;  Surgeon: Harl Bowie, MD;  Location: [facility];  Service: General;  Laterality: N/A;  . LAPAROSCOPIC INCISIONAL / UMBILICAL / [place]  01/24/2013   IHR w/mesh/notes  . NASAL SEPTUM SURGERY  1980's?  . OOPHORECTOMY Right 2009  . TONSILLECTOMY  1990's  . VAGINAL HYSTERECTOMY  ` 06/2007   Past Surgical History:  Procedure Laterality Date  . APPENDECTOMY  ~ 06/2007  . BLADDER REPAIR   ~ 06/2007   "same day after bladder lift" (01/24/2013)  . BLADDER SUSPENSION  ~ 06/2007  . BREAST BIOPSY Left   . DIAGNOSTIC LAPAROSCOPY  1990's & ~ 2000   "I've had a couple; for endometrosis" (01/24/2013)  . HERNIA REPAIR    . INCISIONAL HERNIA REPAIR N/A 01/24/2013   Procedure: LAPAROSCOPIC INCISIONAL HERNIA;  Surgeon: Harl Bowie, MD;  Location: [facility];  Service: General;  Laterality: N/A;  . INSERTION OF MESH N/A 01/24/2013   Procedure: INSERTION OF MESH;  Surgeon: Harl Bowie, MD;  Location: [facility];  Service: General;  Laterality: N/A;  . LAPAROSCOPIC INCISIONAL / UMBILICAL / [place]  01/24/2013   IHR w/mesh/notes  . NASAL SEPTUM SURGERY  1980's?  . OOPHORECTOMY Right 2009  . TONSILLECTOMY  1990's  . VAGINAL HYSTERECTOMY  ` 06/2007   Past Medical History:  Diagnosis Date  . Allergy   . Anxiety   . Arthritis    "just the norm" (01/24/2013)  . ASCVD (arteriosclerotic cardiovascular disease)   . Asthma   . Celiac disease   . Daily headache    "depends on the season" (01/24/2013)  . Fibromyalgia   . GERD (gastroesophageal reflux disease)   . History of colonic polyps   . Hyperlipidemia   . Hypertension   . Migraines   . Obesity (BMI 30.0-34.9)   . RSD (reflex sympathetic dystrophy)   . Sleep apnea    NO MACHINE RECOMMENDED  . TIA (transient ischemic attack) ~ 2009   BP 127/80   Pulse 85   Temp 99 F (37.2 C)   Ht 5' 7"  (1.702 m)   Wt 185 lb 9.6 oz (84.2 kg)   SpO2 97%   BMI 29.07 kg/m   Opioid Risk Score:   Fall Risk Score:  `1  Depression screen PHQ 2/9  Depression screen [facility] 2/9 12/15/2020 11/17/2020 09/16/2020 04/14/2020 02/26/2019 11/29/2018 09/07/2018  Decreased Interest 1 1 1 3 1 1 1   Down, Depressed, Hopeless 1 1 - 3 1 1 1   PHQ - 2 Score 2 2 1 6 2 2 2   Altered sleeping - - - 3 - - -  Tired, decreased energy - - - 3 - - -  Change in appetite - - - 1 - - -  Feeling bad or failure about yourself  - - - 3 - - -  Trouble concentrating - - - 3 -  - -  Moving slowly or fidgety/restless - - - 3 - - -  Suicidal thoughts - - - 0 - - -  PHQ-9 Score - - - 22 - - -  Difficult doing work/chores - - - - - - -  Some recent data might be hidden    Review of Systems  Musculoskeletal:       Arm and hand pain  All other systems reviewed and are negative.      Objective:   Physical Exam Vitals and nursing note reviewed.  Constitutional:      Appearance: Normal appearance.  Cardiovascular:     Rate and Rhythm: Normal rate and regular rhythm.     Pulses: Normal pulses.     Heart sounds: Normal heart sounds.  Pulmonary:     Effort: Pulmonary effort is normal.     Breath sounds: Normal breath sounds.  Musculoskeletal:     Cervical back: Normal range of motion and neck supple.     Comments: Normal Muscle Bulk and Muscle Testing Reveals:  Upper Extremities: Right Upper Extremity: Full  ROM and Muscle Strength  5/5 Left Upper Extremity: Decreased ROM 90 Degrees and Muscle Strength 3/5  Lower Extremities: Decreased ROM and Muscle Strength 4/5 Bilateral Lower Extremities Flexion Produces Pain into her Bilateral Lower Extremities Arises from Table slowly Antalgic Gait   Skin:    General: Skin is warm and dry.  Neurological:     Mental Status: She is alert and oriented to person, place, and time.  Psychiatric:        Mood and Affect: Mood normal.        Behavior: Behavior normal.           Assessment & Plan:  1. Chest Tightness: [name]: Refuses to go to the emergency room for evaluation. Educated on the importance of follow up care of her chest tightness and reviewed chest pain symptoms she still refuses to go to the emergency room and she verbalizes understanding. We wlll continue to monitor.  2.Complex Regional Pain Syndrome Type 1: Continue Topamax and Cymbalta.02/12/2021. Refilled:Hydro-codone 7.5/325mg one tablet twice a day one in the morning and one at bedtime#60.ContinueTramadol 100 mg ER daily #30 and Tramadol  50 mg one tablet by mouth BID. One tablet in the afternoon and one tablet in the evening. We will continue the opioid monitoring program, this consists of regular clinic visits, examinations, urine drug screen, pill counts as well as use of [place] Controlled Substance Reporting system. A 12 month History has been reviewed on the [place] Controlled Substance Reporting SystemOn04/28/2022. 3. Depression: Continuecurrent medication regimen withCymbalta.02/12/2021 4. Insomnia: Continuecurrent medication regimen withTrazodoneone and half tablet at bedtime04/28/2022 5. Muscle Spasms: Continuecurrent medication regimen withTizanidine.02/12/2021 6. Chronic Pain Syndrome: Continue IbuprofenBID as needed.02/12/2021  F/U in 1 month

## 2021-02-16 ENCOUNTER — Other Ambulatory Visit: Payer: Self-pay | Admitting: Internal Medicine

## 2021-02-16 DIAGNOSIS — Z1231 Encounter for screening mammogram for malignant neoplasm of breast: Secondary | ICD-10-CM

## 2021-02-21 ENCOUNTER — Ambulatory Visit
Admission: RE | Admit: 2021-02-21 | Discharge: 2021-02-21 | Disposition: A | Discharge status: Home / Self Care | Payer: Medicare HMO | Source: Ambulatory Visit | Attending: Internal Medicine | Admitting: Internal Medicine

## 2021-02-21 DIAGNOSIS — Z1231 Encounter for screening mammogram for malignant neoplasm of breast: Secondary | ICD-10-CM

## 2021-02-23 DIAGNOSIS — K2270 Barrett's esophagus without dysplasia: Secondary | ICD-10-CM | POA: Diagnosis not present

## 2021-02-23 DIAGNOSIS — Z800 Family history of malignant neoplasm of digestive organs: Secondary | ICD-10-CM | POA: Diagnosis not present

## 2021-02-23 DIAGNOSIS — Z760 Encounter for issue of repeat prescription: Secondary | ICD-10-CM | POA: Diagnosis not present

## 2021-02-23 DIAGNOSIS — K219 Gastro-esophageal reflux disease without esophagitis: Secondary | ICD-10-CM | POA: Diagnosis not present

## 2021-03-03 DIAGNOSIS — Z01818 Encounter for other preprocedural examination: Secondary | ICD-10-CM | POA: Diagnosis not present

## 2021-03-03 DIAGNOSIS — K2270 Barrett's esophagus without dysplasia: Secondary | ICD-10-CM | POA: Diagnosis not present

## 2021-03-03 DIAGNOSIS — Z1211 Encounter for screening for malignant neoplasm of colon: Secondary | ICD-10-CM | POA: Diagnosis not present

## 2021-03-13 ENCOUNTER — Other Ambulatory Visit: Payer: Self-pay

## 2021-03-13 ENCOUNTER — Encounter: Payer: Self-pay | Admitting: Registered Nurse

## 2021-03-13 ENCOUNTER — Encounter: Payer: Worker's Compensation | Attending: Registered Nurse | Admitting: Registered Nurse

## 2021-03-13 VITALS — BP 125/86 | HR 89 | Temp 99.2°F | Ht 67.0 in | Wt 185.2 lb

## 2021-03-13 DIAGNOSIS — M62838 Other muscle spasm: Secondary | ICD-10-CM

## 2021-03-13 DIAGNOSIS — Z79891 Long term (current) use of opiate analgesic: Secondary | ICD-10-CM

## 2021-03-13 DIAGNOSIS — M797 Fibromyalgia: Secondary | ICD-10-CM | POA: Insufficient documentation

## 2021-03-13 DIAGNOSIS — G894 Chronic pain syndrome: Secondary | ICD-10-CM | POA: Diagnosis not present

## 2021-03-13 DIAGNOSIS — W19XXXD Unspecified fall, subsequent encounter: Secondary | ICD-10-CM | POA: Diagnosis not present

## 2021-03-13 DIAGNOSIS — G4700 Insomnia, unspecified: Secondary | ICD-10-CM | POA: Diagnosis present

## 2021-03-13 DIAGNOSIS — F3289 Other specified depressive episodes: Secondary | ICD-10-CM | POA: Diagnosis not present

## 2021-03-13 DIAGNOSIS — Z5181 Encounter for therapeutic drug level monitoring: Secondary | ICD-10-CM | POA: Diagnosis not present

## 2021-03-13 DIAGNOSIS — G90512 Complex regional pain syndrome I of left upper limb: Secondary | ICD-10-CM | POA: Diagnosis present

## 2021-03-13 DIAGNOSIS — Y92009 Unspecified place in unspecified non-institutional (private) residence as the place of occurrence of the external cause: Secondary | ICD-10-CM

## 2021-03-13 DIAGNOSIS — K2270 Barrett's esophagus without dysplasia: Secondary | ICD-10-CM | POA: Diagnosis not present

## 2021-03-13 MED ORDER — HYDROCODONE-ACETAMINOPHEN 7.5-325 MG PO TABS: ORAL_TABLET | ORAL | 0 refills | Status: DC

## 2021-03-13 NOTE — Progress Notes (Signed)
Subjective:    Patient ID: [name], female    DOB: [birth year], 55 y.o.   MRN: [id number]  HPI: [name] is a 55 y.o. female who returns for follow up appointment for chronic pain and medication refill. She states her pain is located in her left arm. She rates her pain 8. Her current exercise regime is walking and performing stretching exercises.  [name] reports yesterday she was walking in her home and lost her balanced, and fell forward. Her daughter helped her up and she didn't seek medical attention. Educated on falls prevention, she verbalizes understanding.   [name] Morphine equivalent is 35.00  MME.  Last UDS was Performed on 12/15/2020, it was consistent.    Pain Inventory Average Pain 8 Pain Right Now 8 My pain is constant, sharp, burning, stabbing, tingling and aching  In the last 24 hours, has pain interfered with the following? General activity 4 Relation with others 4 Enjoyment of life 4 What TIME of day is your pain at its worst? morning , daytime, evening, night and varies Sleep (in general) Poor  Pain is worse with: walking, bending, sitting, standing and some activites Pain improves with: rest, therapy/exercise, pacing activities, medication, TENS and heat Relief from Meds: 6  Family History  Problem Relation Age of Onset  . Cancer Mother        lung  . Hypertension Mother   . Cancer Father        colon  . Hypertension Sister   . Cancer Sister   . Cancer Brother   . Hyperlipidemia Sister   . Birth defects Maternal Grandmother        breast  . Breast cancer Maternal Grandmother   . Birth defects Paternal Grandmother        uterine, stomach, lung  . Breast cancer Paternal Grandmother   . Breast cancer Paternal Aunt    Social History   Socioeconomic History  . Marital status: Married    Spouse name: Not on file  . Number of children: 2  . Years of education: Not on file  . Highest education level: Not on  file  Occupational History  . 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 Smoking status: Never Smoker  . Smokeless tobacco: Never Used  Vaping Use  . Vaping Use: Never used  Substance and Sexual Activity  . Alcohol use: Yes    Alcohol/week: 0.0 standard drinks    Comment: 01/24/2013 "drink or 2 once or /twice/year, if that"  . Drug use: No  . Sexual activity: Not Currently  Other Topics Concern  . Not on file  Social History Narrative   No living will   Would want husband as POA--then sister, Neoma Laming   Would accept resuscitation attempts   Probably would not want tube feeds if cognitively unaware   Social Determinants of Health   Financial Resource Strain: Not on file  Food Insecurity: Not on file  Transportation Needs: Not on file  Physical Activity: Not on file  Stress: Not on file  Social Connections: Not on file   Past Surgical History:  Procedure Laterality Date  . APPENDECTOMY  ~ 06/2007  . BLADDER REPAIR  ~ 06/2007   "same day after bladder lift" (01/24/2013)  . BLADDER SUSPENSION  ~ 06/2007  . BREAST BIOPSY Left   . DIAGNOSTIC LAPAROSCOPY  1990's & ~ 2000   "I've had a couple; for endometrosis" (01/24/2013)  . HERNIA REPAIR    .  INCISIONAL HERNIA REPAIR N/A 01/24/2013   Procedure: LAPAROSCOPIC INCISIONAL HERNIA;  Surgeon: Harl Bowie, MD;  Location: [place];  Service: General;  Laterality: N/A;  . INSERTION OF MESH N/A 01/24/2013   Procedure: INSERTION OF MESH;  Surgeon: Harl Bowie, MD;  Location: [place];  Service: General;  Laterality: N/A;  . LAPAROSCOPIC INCISIONAL / UMBILICAL / [place]  01/24/2013   IHR w/mesh/notes  . NASAL SEPTUM SURGERY  1980's?  . OOPHORECTOMY Right 2009  . TONSILLECTOMY  1990's  . VAGINAL HYSTERECTOMY  ` 06/2007   Past Surgical History:  Procedure Laterality Date  . APPENDECTOMY  ~ 06/2007  . BLADDER REPAIR  ~ 06/2007   "same day after bladder lift" (01/24/2013)  . BLADDER SUSPENSION  ~ 06/2007  . BREAST BIOPSY Left    . DIAGNOSTIC LAPAROSCOPY  1990's & ~ 2000   "I've had a couple; for endometrosis" (01/24/2013)  . HERNIA REPAIR    . INCISIONAL HERNIA REPAIR N/A 01/24/2013   Procedure: LAPAROSCOPIC INCISIONAL HERNIA;  Surgeon: Harl Bowie, MD;  Location: [place];  Service: General;  Laterality: N/A;  . INSERTION OF MESH N/A 01/24/2013   Procedure: INSERTION OF MESH;  Surgeon: Harl Bowie, MD;  Location: [place];  Service: General;  Laterality: N/A;  . LAPAROSCOPIC INCISIONAL / UMBILICAL / [place]  01/24/2013   IHR w/mesh/notes  . NASAL SEPTUM SURGERY  1980's?  . OOPHORECTOMY Right 2009  . TONSILLECTOMY  1990's  . VAGINAL HYSTERECTOMY  ` 06/2007   Past Medical History:  Diagnosis Date  . Allergy   . Anxiety   . Arthritis    "just the norm" (01/24/2013)  . ASCVD (arteriosclerotic cardiovascular disease)   . Asthma   . Celiac disease   . Daily headache    "depends on the season" (01/24/2013)  . Fibromyalgia   . GERD (gastroesophageal reflux disease)   . History of colonic polyps   . Hyperlipidemia   . Hypertension   . Migraines   . Obesity (BMI 30.0-34.9)   . RSD (reflex sympathetic dystrophy)   . Sleep apnea    NO MACHINE RECOMMENDED  . TIA (transient ischemic attack) ~ 2009   There were no vitals taken for this visit.  Opioid Risk Score:   Fall Risk Score:  `1  Depression screen PHQ 2/9  Depression screen [facility] 2/9 12/15/2020 11/17/2020 09/16/2020 04/14/2020 02/26/2019 11/29/2018 09/07/2018  Decreased Interest 1 1 1 3 1 1 1   Down, Depressed, Hopeless 1 1 - 3 1 1 1   PHQ - 2 Score 2 2 1 6 2 2 2   Altered sleeping - - - 3 - - -  Tired, decreased energy - - - 3 - - -  Change in appetite - - - 1 - - -  Feeling bad or failure about yourself  - - - 3 - - -  Trouble concentrating - - - 3 - - -  Moving slowly or fidgety/restless - - - 3 - - -  Suicidal thoughts - - - 0 - - -  PHQ-9 Score - - - 22 - - -  Difficult doing work/chores - - - - - - -  Some recent data might be hidden    Review of Systems  Musculoskeletal:       Right arm & right hand pain  All other systems reviewed and are negative.      Objective:   Physical Exam Vitals and nursing note reviewed.  Constitutional:  Appearance: Normal appearance.  Cardiovascular:     Rate and Rhythm: Normal rate and regular rhythm.     Pulses: Normal pulses.     Heart sounds: Normal heart sounds.  Pulmonary:     Effort: Pulmonary effort is normal.     Breath sounds: Normal breath sounds.  Musculoskeletal:     Cervical back: Normal range of motion and neck supple.     Comments: Normal Muscle Bulk and Muscle Testing Reveals:  Upper Extremities: Right: Full ROM and Muscle Strength 5/5 Left Upper Extremity: Decreased ROM 90 Degrees and Muscle Strength 3/5 Lower Extremities: Full ROM and Muscle Strength 4/5 Arises from Table Slowly  Antalgic Gait   Skin:    General: Skin is warm and dry.  Neurological:     Mental Status: She is alert and oriented to person, place, and time.  Psychiatric:        Mood and Affect: Mood normal.        Behavior: Behavior normal.           Assessment & Plan:  1.Complex Regional Pain Syndrome Type 1: Continue Topamax and Cymbalta.03/13/2021. Refilled:Hydro-codone 7.5/325mg one tablet twice a day one in the morning and one at bedtime#60.ContinueTramadol 100 mg ER daily #30 and Tramadol 50 mg one tablet by mouth BID. One tablet in the afternoon and one tablet in the evening. We will continue the opioid monitoring program, this consists of regular clinic visits, examinations, urine drug screen, pill counts as well as use of [place] Controlled Substance Reporting system. A 12 month History has been reviewed on the [place] Controlled Substance Reporting SystemOn05/27/2022. 2. Depression: Continuecurrent medication regimen withCymbalta.03/13/2021 3. Insomnia: Continuecurrent medication regimen withTrazodoneone and half tablet at bedtime05/27/2022 4.  Muscle Spasms: Continuecurrent medication regimen withTizanidine.03/13/2021 5. Chronic Pain Syndrome: Continue IbuprofenBID as needed.03/13/2021 6. Fall at Home: Educated on Fall Prevention, she verbalizes understanding.   F/U in 1 month

## 2021-03-17 DIAGNOSIS — R1084 Generalized abdominal pain: Secondary | ICD-10-CM | POA: Diagnosis not present

## 2021-03-17 DIAGNOSIS — K2270 Barrett's esophagus without dysplasia: Secondary | ICD-10-CM | POA: Diagnosis not present

## 2021-03-17 DIAGNOSIS — Z6828 Body mass index (BMI) 28.0-28.9, adult: Secondary | ICD-10-CM | POA: Diagnosis not present

## 2021-03-17 DIAGNOSIS — K2900 Acute gastritis without bleeding: Secondary | ICD-10-CM | POA: Diagnosis not present

## 2021-03-19 DIAGNOSIS — R1084 Generalized abdominal pain: Secondary | ICD-10-CM | POA: Diagnosis not present

## 2021-04-07 DIAGNOSIS — Z8719 Personal history of other diseases of the digestive system: Secondary | ICD-10-CM | POA: Diagnosis not present

## 2021-04-07 DIAGNOSIS — R0600 Dyspnea, unspecified: Secondary | ICD-10-CM | POA: Diagnosis not present

## 2021-04-07 DIAGNOSIS — R748 Abnormal levels of other serum enzymes: Secondary | ICD-10-CM | POA: Diagnosis not present

## 2021-04-07 DIAGNOSIS — R1013 Epigastric pain: Secondary | ICD-10-CM | POA: Diagnosis not present

## 2021-04-14 ENCOUNTER — Encounter: Payer: Self-pay | Admitting: Registered Nurse

## 2021-04-14 ENCOUNTER — Encounter: Payer: Worker's Compensation | Attending: Registered Nurse | Admitting: Registered Nurse

## 2021-04-14 ENCOUNTER — Other Ambulatory Visit: Payer: Self-pay

## 2021-04-14 VITALS — BP 126/82 | HR 95 | Temp 98.4°F | Ht 67.0 in | Wt 186.2 lb

## 2021-04-14 DIAGNOSIS — Z5181 Encounter for therapeutic drug level monitoring: Secondary | ICD-10-CM | POA: Diagnosis present

## 2021-04-14 DIAGNOSIS — M62838 Other muscle spasm: Secondary | ICD-10-CM | POA: Diagnosis not present

## 2021-04-14 DIAGNOSIS — Z79891 Long term (current) use of opiate analgesic: Secondary | ICD-10-CM | POA: Insufficient documentation

## 2021-04-14 DIAGNOSIS — F3289 Other specified depressive episodes: Secondary | ICD-10-CM | POA: Insufficient documentation

## 2021-04-14 DIAGNOSIS — G4700 Insomnia, unspecified: Secondary | ICD-10-CM | POA: Insufficient documentation

## 2021-04-14 DIAGNOSIS — G90512 Complex regional pain syndrome I of left upper limb: Secondary | ICD-10-CM | POA: Diagnosis not present

## 2021-04-14 DIAGNOSIS — G894 Chronic pain syndrome: Secondary | ICD-10-CM | POA: Diagnosis present

## 2021-04-14 MED ORDER — TRAMADOL HCL ER 100 MG PO TB24: 100.0000 mg | ORAL_TABLET | Freq: Every day | ORAL | 2 refills | Status: DC

## 2021-04-14 MED ORDER — TRAMADOL HCL 50 MG PO TABS: ORAL_TABLET | ORAL | 2 refills | Status: DC

## 2021-04-14 MED ORDER — TIZANIDINE HCL 4 MG PO TABS: ORAL_TABLET | ORAL | 3 refills | Status: DC

## 2021-04-14 MED ORDER — HYDROCODONE-ACETAMINOPHEN 7.5-325 MG PO TABS: ORAL_TABLET | ORAL | 0 refills | Status: DC

## 2021-04-14 NOTE — Progress Notes (Signed)
Subjective:    Patient ID: [name], female    DOB: 04-Jun-1966, 55 y.o.   MRN: [id number]  HPI: [name] is a 55 y.o. female who returns for follow up appointment for chronic pain and medication refill. She states her pain is located in her left arm and left wrist. She rates her pain 7. Her current exercise regime is walking and performing stretching exercises.  [name] Morphine equivalent is 35.00 MME.  Last UDS was Performed on 12/15/2020, it was consistent.     Pain Inventory Average Pain 8 Pain Right Now 7 My pain is constant, sharp, burning, stabbing, tingling, and aching  In the last 24 hours, has pain interfered with the following? General activity 7 Relation with others 7 Enjoyment of life 7 What TIME of day is your pain at its worst? varies Sleep (in general) Poor  Pain is worse with: walking, bending, sitting, standing, and some activites Pain improves with: rest, heat/ice, pacing activities, medication, and TENS Relief from Meds: 6  Family History  Problem Relation Age of Onset   Cancer Mother        lung   Hypertension Mother    Cancer Father        colon   Hypertension Sister    Cancer Sister    Cancer Brother    Hyperlipidemia Sister    Birth defects Maternal Grandmother        breast   Breast cancer Maternal Grandmother    Birth defects Paternal Grandmother        uterine, stomach, lung   Breast cancer Paternal Grandmother    Breast cancer Paternal Aunt    Social History   Socioeconomic History   Marital status: Married    Spouse name: Not on file   Number of children: 2   Years of education: Not on file   Highest education level: Not on file  Occupational History   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  Tobacco Use   Smoking status: Never   Smokeless tobacco: Never  Vaping Use   Vaping Use: Never used  Substance and Sexual Activity   Alcohol use: Not Currently    Alcohol/week: 0.0 standard drinks    Comment:  01/24/2013 "drink or 2 once or /twice/year, if that"   Drug use: No   Sexual activity: Not Currently  Other Topics Concern   Not on file  Social History Narrative   No living will   Would want husband as POA--then sister, Neoma Laming   Would accept resuscitation attempts   Probably would not want tube feeds if cognitively unaware   Social Determinants of Health   Financial Resource Strain: Not on file  Food Insecurity: Not on file  Transportation Needs: Not on file  Physical Activity: Not on file  Stress: Not on file  Social Connections: Not on file   Past Surgical History:  Procedure Laterality Date   APPENDECTOMY  ~ 06/2007   BLADDER REPAIR  ~ 06/2007   "same day after bladder lift" (01/24/2013)   BLADDER SUSPENSION  ~ 06/2007   BREAST BIOPSY Left    DIAGNOSTIC LAPAROSCOPY  1990's & ~ 2000   "I've had a couple; for endometrosis" (01/24/2013)   [place] N/A 01/24/2013   Procedure: LAPAROSCOPIC INCISIONAL HERNIA;  Surgeon: Harl Bowie, MD;  Location: [place];  Service: General;  Laterality: N/A;   INSERTION OF MESH N/A 01/24/2013   Procedure: INSERTION OF MESH;  Surgeon:  Harl Bowie, MD;  Location: [place];  Service: General;  Laterality: N/A;   LAPAROSCOPIC INCISIONAL / UMBILICAL / [place]  01/24/2013   IHR w/mesh/notes   NASAL SEPTUM SURGERY  1980's?   OOPHORECTOMY Right 2009   TONSILLECTOMY  1990's   VAGINAL HYSTERECTOMY  ` 06/2007   Past Surgical History:  Procedure Laterality Date   APPENDECTOMY  ~ 06/2007   BLADDER REPAIR  ~ 06/2007   "same day after bladder lift" (01/24/2013)   BLADDER SUSPENSION  ~ 06/2007   BREAST BIOPSY Left    DIAGNOSTIC LAPAROSCOPY  1990's & ~ 2000   "I've had a couple; for endometrosis" (01/24/2013)   [place] N/A 01/24/2013   Procedure: LAPAROSCOPIC INCISIONAL HERNIA;  Surgeon: Harl Bowie, MD;  Location: [place];  Service: General;  Laterality: N/A;   INSERTION OF  MESH N/A 01/24/2013   Procedure: INSERTION OF MESH;  Surgeon: Harl Bowie, MD;  Location: [place];  Service: General;  Laterality: N/A;   LAPAROSCOPIC INCISIONAL / UMBILICAL / [place]  01/24/2013   IHR w/mesh/notes   NASAL SEPTUM SURGERY  1980's?   OOPHORECTOMY Right 2009   TONSILLECTOMY  1990's   VAGINAL HYSTERECTOMY  ` 06/2007   Past Medical History:  Diagnosis Date   Allergy    Anxiety    Arthritis    "just the norm" (01/24/2013)   ASCVD (arteriosclerotic cardiovascular disease)    Asthma    Celiac disease    Daily headache    "depends on the season" (01/24/2013)   Fibromyalgia    GERD (gastroesophageal reflux disease)    History of colonic polyps    Hyperlipidemia    Hypertension    Migraines    Obesity (BMI 30.0-34.9)    RSD (reflex sympathetic dystrophy)    Sleep apnea    NO MACHINE RECOMMENDED   TIA (transient ischemic attack) ~ 2009   BP 126/82 (BP Location: Right Arm, Patient Position: Sitting, Cuff Size: Large)   Pulse 95   Temp 98.4 F (36.9 C) (Oral)   Ht 5' 7"  (1.702 m)   Wt 186 lb 3.2 oz (84.5 kg)   SpO2 96%   BMI 29.16 kg/m   Opioid Risk Score:   Fall Risk Score:  `1  Depression screen PHQ 2/9  Depression screen [facility] 2/9 03/13/2021 12/15/2020 11/17/2020 09/16/2020 04/14/2020 02/26/2019 11/29/2018  Decreased Interest 1 1 1 1 3 1 1   Down, Depressed, Hopeless 1 1 1  - 3 1 1   PHQ - 2 Score 2 2 2 1 6 2 2   Altered sleeping - - - - 3 - -  Tired, decreased energy - - - - 3 - -  Change in appetite - - - - 1 - -  Feeling bad or failure about yourself  - - - - 3 - -  Trouble concentrating - - - - 3 - -  Moving slowly or fidgety/restless - - - - 3 - -  Suicidal thoughts - - - - 0 - -  PHQ-9 Score - - - - 22 - -  Difficult doing work/chores - - - - - - -  Some recent data might be hidden     Review of Systems  Constitutional: Negative.   HENT: Negative.    Eyes: Negative.   Respiratory: Negative.    Cardiovascular: Negative.   Endocrine:  Negative.   Genitourinary: Negative.   Musculoskeletal:  Left arm pain  Skin: Negative.   Allergic/Immunologic: Negative.   Neurological: Negative.   Hematological: Negative.   Psychiatric/Behavioral: Negative.        Objective:   Physical Exam Vitals and nursing note reviewed.  Constitutional:      Appearance: Normal appearance.  Cardiovascular:     Rate and Rhythm: Normal rate and regular rhythm.     Pulses: Normal pulses.     Heart sounds: Normal heart sounds.  Pulmonary:     Effort: Pulmonary effort is normal.     Breath sounds: Normal breath sounds.  Musculoskeletal:     Cervical back: Normal range of motion and neck supple.     Comments: Normal Muscle Bulk and Muscle Testing Reveals:  Upper Extremities: Right: Full ROM and Muscle Strength  5/5 Left Upper Extremity: Decreased ROM 90 Degrees and Muscle Strength 2/5 Lower Extremities: Decreased ROM and Muscle Strength 4/5  Bilateral Lower Extremities Flexion Produces Pain into his Bilateral Lower Extremities Arises from Table Slowly  Antalgic Gait     Skin:    General: Skin is warm and dry.  Neurological:     Mental Status: She is alert and oriented to person, place, and time.  Psychiatric:        Mood and Affect: Mood normal.        Behavior: Behavior normal.         Assessment & Plan:  1.Complex Regional Pain Syndrome Type 1: Continue Topamax and Cymbalta. 04/14/2021. Refilled: Hydro-codone 7.5/325mg one tablet twice a day one in the morning and one at bedtime #60. Continue Tramadol 100 mg ER daily #30 and Tramadol 50 mg one tablet by mouth BID. One tablet in the afternoon and one tablet in the evening. We will continue the opioid monitoring program, this consists of regular clinic visits, examinations, urine drug screen, pill counts as well as use of [place] Controlled Substance Reporting system. A 12 month History has been reviewed on the [place] Controlled Substance Reporting System  On  03/13/2021. 2. Depression: Continue current medication regimen with Cymbalta.04/14/2021 3. Insomnia: Continue current medication regimen with  Trazodone one and half tablet at bedtime  04/14/2021 4. Muscle Spasms: Continue current medication regimen with Tizanidine. 04/14/2021 5. Chronic Pain Syndrome: Continue Ibuprofen BID as needed. 04/14/2021     F/U in 1 month

## 2021-05-07 ENCOUNTER — Other Ambulatory Visit: Payer: Self-pay | Admitting: Registered Nurse

## 2021-05-12 ENCOUNTER — Encounter: Payer: Self-pay | Admitting: Registered Nurse

## 2021-05-12 ENCOUNTER — Encounter: Payer: Worker's Compensation | Attending: Registered Nurse | Admitting: Registered Nurse

## 2021-05-12 ENCOUNTER — Other Ambulatory Visit: Payer: Self-pay

## 2021-05-12 VITALS — BP 122/77 | HR 89 | Temp 98.6°F | Ht 67.0 in | Wt 189.0 lb

## 2021-05-12 DIAGNOSIS — G4700 Insomnia, unspecified: Secondary | ICD-10-CM

## 2021-05-12 DIAGNOSIS — M62838 Other muscle spasm: Secondary | ICD-10-CM | POA: Diagnosis not present

## 2021-05-12 DIAGNOSIS — G894 Chronic pain syndrome: Secondary | ICD-10-CM | POA: Diagnosis present

## 2021-05-12 DIAGNOSIS — F3289 Other specified depressive episodes: Secondary | ICD-10-CM

## 2021-05-12 DIAGNOSIS — Z79891 Long term (current) use of opiate analgesic: Secondary | ICD-10-CM

## 2021-05-12 DIAGNOSIS — G90512 Complex regional pain syndrome I of left upper limb: Secondary | ICD-10-CM

## 2021-05-12 DIAGNOSIS — Z5181 Encounter for therapeutic drug level monitoring: Secondary | ICD-10-CM

## 2021-05-12 MED ORDER — HYDROCODONE-ACETAMINOPHEN 7.5-325 MG PO TABS: ORAL_TABLET | ORAL | 0 refills | Status: DC

## 2021-05-12 NOTE — Progress Notes (Signed)
Subjective:    Patient ID: [name], female    DOB: 07-Aug-1966, 55 y.o.   MRN: [id number]  HPI: [name] is a 55 y.o. female who returns for follow up appointment for chronic pain and medication refill. She states her pain is located in her left wrist and left arm. She rates her pain 7. Her current exercise regime is walking, pool therapy and performing stretching exercises.  [name] Morphine equivalent is 35.00 MME.   Last UDS was Performed on 12/15/2020, it was consistent.    Pain Inventory Average Pain 7 Pain Right Now 7 My pain is constant, sharp, burning, stabbing, tingling, and aching  In the last 24 hours, has pain interfered with the following? General activity 4 Relation with others 4 Enjoyment of life 4 What TIME of day is your pain at its worst? morning , daytime, evening, and night Sleep (in general) Poor  Pain is worse with: walking, bending, sitting, standing, unsure, and some activites Pain improves with: rest, heat/ice, therapy/exercise, pacing activities, medication, and TENS Relief from Meds: 6  Family History  Problem Relation Age of Onset   Cancer Mother        lung   Hypertension Mother    Cancer Father        colon   Hypertension Sister    Cancer Sister    Cancer Brother    Hyperlipidemia Sister    Birth defects Maternal Grandmother        breast   Breast cancer Maternal Grandmother    Birth defects Paternal Grandmother        uterine, stomach, lung   Breast cancer Paternal Grandmother    Breast cancer Paternal Aunt    Social History   Socioeconomic History   Marital status: Married    Spouse name: Not on file   Number of children: 2   Years of education: Not on file   Highest education level: Not on file  Occupational History   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  Tobacco Use   Smoking status: Never   Smokeless tobacco: Never  Vaping Use   Vaping Use: Never used  Substance and Sexual Activity    Alcohol use: Not Currently    Alcohol/week: 0.0 standard drinks    Comment: 01/24/2013 "drink or 2 once or /twice/year, if that"   Drug use: No   Sexual activity: Not Currently  Other Topics Concern   Not on file  Social History Narrative   No living will   Would want husband as POA--then sister, Neoma Laming   Would accept resuscitation attempts   Probably would not want tube feeds if cognitively unaware   Social Determinants of Health   Financial Resource Strain: Not on file  Food Insecurity: Not on file  Transportation Needs: Not on file  Physical Activity: Not on file  Stress: Not on file  Social Connections: Not on file   Past Surgical History:  Procedure Laterality Date   APPENDECTOMY  ~ 06/2007   BLADDER REPAIR  ~ 06/2007   "same day after bladder lift" (01/24/2013)   BLADDER SUSPENSION  ~ 06/2007   BREAST BIOPSY Left    DIAGNOSTIC LAPAROSCOPY  1990's & ~ 2000   "I've had a couple; for endometrosis" (01/24/2013)   [place] N/A 01/24/2013   Procedure: LAPAROSCOPIC INCISIONAL HERNIA;  Surgeon: Harl Bowie, MD;  Location: [place];  Service: General;  Laterality: N/A;   INSERTION OF MESH N/A  01/24/2013   Procedure: INSERTION OF MESH;  Surgeon: Harl Bowie, MD;  Location: [place];  Service: General;  Laterality: N/A;   LAPAROSCOPIC INCISIONAL / UMBILICAL / [place]  01/24/2013   IHR w/mesh/notes   NASAL SEPTUM SURGERY  1980's?   OOPHORECTOMY Right 2009   TONSILLECTOMY  1990's   VAGINAL HYSTERECTOMY  ` 06/2007   Past Surgical History:  Procedure Laterality Date   APPENDECTOMY  ~ 06/2007   BLADDER REPAIR  ~ 06/2007   "same day after bladder lift" (01/24/2013)   BLADDER SUSPENSION  ~ 06/2007   BREAST BIOPSY Left    DIAGNOSTIC LAPAROSCOPY  1990's & ~ 2000   "I've had a couple; for endometrosis" (01/24/2013)   [place] N/A 01/24/2013   Procedure: LAPAROSCOPIC INCISIONAL HERNIA;  Surgeon: Harl Bowie, MD;  Location: [place];  Service: General;  Laterality: N/A;   INSERTION OF MESH N/A 01/24/2013   Procedure: INSERTION OF MESH;  Surgeon: Harl Bowie, MD;  Location: [place];  Service: General;  Laterality: N/A;   LAPAROSCOPIC INCISIONAL / UMBILICAL / [place]  01/24/2013   IHR w/mesh/notes   NASAL SEPTUM SURGERY  1980's?   OOPHORECTOMY Right 2009   TONSILLECTOMY  1990's   VAGINAL HYSTERECTOMY  ` 06/2007   Past Medical History:  Diagnosis Date   Allergy    Anxiety    Arthritis    "just the norm" (01/24/2013)   ASCVD (arteriosclerotic cardiovascular disease)    Asthma    Celiac disease    Daily headache    "depends on the season" (01/24/2013)   Fibromyalgia    GERD (gastroesophageal reflux disease)    History of colonic polyps    Hyperlipidemia    Hypertension    Migraines    Obesity (BMI 30.0-34.9)    RSD (reflex sympathetic dystrophy)    Sleep apnea    NO MACHINE RECOMMENDED   TIA (transient ischemic attack) ~ 2009   BP 122/77 (BP Location: Right Arm)   Pulse 89   Temp 98.6 F (37 C) (Oral)   Ht 5' 7"  (1.702 m)   Wt 189 lb (85.7 kg)   SpO2 94%   BMI 29.60 kg/m   Opioid Risk Score:   Fall Risk Score:  `1  Depression screen PHQ 2/9  Depression screen [facility] 2/9 04/14/2021 03/13/2021 12/15/2020 11/17/2020 09/16/2020 04/14/2020 02/26/2019  Decreased Interest 1 1 1 1 1 3 1   Down, Depressed, Hopeless 1 1 1 1  - 3 1  PHQ - 2 Score 2 2 2 2 1 6 2   Altered sleeping - - - - - 3 -  Tired, decreased energy - - - - - 3 -  Change in appetite - - - - - 1 -  Feeling bad or failure about yourself  - - - - - 3 -  Trouble concentrating - - - - - 3 -  Moving slowly or fidgety/restless - - - - - 3 -  Suicidal thoughts - - - - - 0 -  PHQ-9 Score - - - - - 22 -  Difficult doing work/chores - - - - - - -  Some recent data might be hidden       Review of Systems  Constitutional: Negative.   HENT: Negative.    Eyes: Negative.   Respiratory: Negative.     Cardiovascular: Negative.   Gastrointestinal: Negative.   Endocrine: Negative.   Genitourinary: Negative.  Musculoskeletal:        Pain in left arm  Skin: Negative.   Allergic/Immunologic: Negative.   Neurological: Negative.   Hematological: Negative.   Psychiatric/Behavioral: Negative.        Objective:   Physical Exam Vitals and nursing note reviewed.  Constitutional:      Appearance: Normal appearance.  Cardiovascular:     Rate and Rhythm: Normal rate and regular rhythm.     Pulses: Normal pulses.     Heart sounds: Normal heart sounds.  Pulmonary:     Effort: Pulmonary effort is normal.     Breath sounds: Normal breath sounds.  Musculoskeletal:     Cervical back: Normal range of motion and neck supple.     Comments: Normal Muscle Bulk and Muscle Testing Reveals:  Upper Extremities: Right: Full ROM and Muscle Strength  4/5  Left Upper Extremity Decreased ROM 90 Degrees and Muscle Strength 3/5 Bilateral AC Joint Tenderness Thoracic Hypersensitivity Lower Extremities: Full ROM and Muscle Strength 4/5 Bilateral Lower Extremities Flexion Produces Pain into her Bilateral Lower Extremities and Bilateral Feet Arises from Table Slowly Antalgic Gait     Skin:    General: Skin is warm and dry.  Neurological:     Mental Status: She is alert and oriented to person, place, and time.  Psychiatric:        Mood and Affect: Mood normal.        Behavior: Behavior normal.         Assessment & Plan:  1.Complex Regional Pain Syndrome Type 1: Continue Topamax and Cymbalta. 05/12/2021. Refilled: Hydro-codone 7.5/325mg one tablet twice a day one in the morning and one at bedtime #60. Continue Tramadol 100 mg ER daily #30 and Tramadol 50 mg one tablet by mouth BID. One tablet in the afternoon and one tablet in the evening. We will continue the opioid monitoring program, this consists of regular clinic visits, examinations, urine drug screen, pill counts as well as use of [place] Controlled Substance Reporting system. A 12 month History has been reviewed on the [place] Controlled Substance Reporting System  On 05/12/2021. 2. Depression: Continue current medication regimen with Cymbalta.05/12/2021 3. Insomnia: Continue current medication regimen with  Trazodone one and half tablet at bedtime  05/12/2021 4. Muscle Spasms: Continue current medication regimen with Tizanidine. 05/12/2021 5. Chronic Pain Syndrome: Continue Ibuprofen BID as needed. 05/12/2021     F/U in 1 month

## 2021-05-19 ENCOUNTER — Other Ambulatory Visit: Payer: Self-pay | Admitting: Gastroenterology

## 2021-05-19 ENCOUNTER — Other Ambulatory Visit (HOSPITAL_COMMUNITY): Payer: Self-pay | Admitting: Gastroenterology

## 2021-05-19 DIAGNOSIS — R1013 Epigastric pain: Secondary | ICD-10-CM

## 2021-05-26 ENCOUNTER — Encounter: Payer: Medicare HMO | Admitting: Internal Medicine

## 2021-06-02 ENCOUNTER — Telehealth: Payer: Self-pay | Admitting: *Deleted

## 2021-06-02 ENCOUNTER — Ambulatory Visit
Admission: EM | Admit: 2021-06-02 | Discharge: 2021-06-02 | Disposition: A | Discharge status: Home / Self Care | Payer: Medicare HMO | Attending: Urgent Care | Admitting: Urgent Care

## 2021-06-02 ENCOUNTER — Other Ambulatory Visit: Payer: Self-pay

## 2021-06-02 ENCOUNTER — Ambulatory Visit (HOSPITAL_COMMUNITY): Payer: Medicare HMO

## 2021-06-02 ENCOUNTER — Encounter (HOSPITAL_COMMUNITY): Payer: Self-pay

## 2021-06-02 DIAGNOSIS — U071 COVID-19: Secondary | ICD-10-CM | POA: Diagnosis not present

## 2021-06-02 DIAGNOSIS — Z20822 Contact with and (suspected) exposure to covid-19: Secondary | ICD-10-CM | POA: Diagnosis not present

## 2021-06-02 DIAGNOSIS — J4540 Moderate persistent asthma, uncomplicated: Secondary | ICD-10-CM

## 2021-06-02 MED ORDER — MOLNUPIRAVIR EUA 200MG CAPSULE: 4.0000 | ORAL_CAPSULE | Freq: Two times a day (BID) | ORAL | 0 refills | Status: AC

## 2021-06-02 MED ORDER — ALBUTEROL SULFATE HFA 108 (90 BASE) MCG/ACT IN AERS: 2.0000 | INHALATION_SPRAY | Freq: Four times a day (QID) | RESPIRATORY_TRACT | 1 refills | Status: DC | PRN

## 2021-06-02 MED ORDER — PROMETHAZINE-DM 6.25-15 MG/5ML PO SYRP: 5.0000 mL | ORAL_SOLUTION | Freq: Every evening | ORAL | 0 refills | Status: DC | PRN

## 2021-06-02 NOTE — ED Triage Notes (Signed)
Pt c/o cough, SOB, sore throat, congestion, and headache since yesterday. States had a positive home covid test. No distress noted. Pt speaking in complete sentences.

## 2021-06-02 NOTE — Discharge Instructions (Addendum)
We will notify you of your COVID-19 test results as they arrive and may take between 48-72 hours.  I encourage you to sign up for MyChart if you have not already done so as this can be the easiest way for [place] to communicate results to you online or through a phone app.  In the meantime, if you develop worsening symptoms including fever, chest pain, shortness of breath despite our current treatment plan then please report to the emergency room as this may be a sign of worsening status from possible COVID-19 infection.  Otherwise, we will manage this as a viral syndrome. For sore throat or cough try using a honey-based tea. Use 3 teaspoons of honey with juice squeezed from half lemon. Place shaved pieces of ginger into 1/2-1 cup of water and warm over stove top. Then mix the ingredients and repeat every 4 hours as needed. Please take Tylenol 558m-650mg every 6 hours for aches and pains, fevers. Hydrate very well with at least 2 liters of water. Eat light meals such as soups to replenish electrolytes and soft fruits, veggies. Start an antihistamine like Zyrtec, Allegra or Claritin for postnasal drainage, sinus congestion.

## 2021-06-02 NOTE — ED Provider Notes (Signed)
[place]   MRN: [id number] DOB: 10/02/1966  Subjective:   [name] is a 56 y.o. female presenting for 1 day history of acute onset cough, shortness of breath, throat pain, malaise and fatigue, sinus congestion, sinus headaches. No COVID vaccination.  Patient took a COVID test at home and was positive.  She is requesting an antibiotic for sinus infection.  Denies history of heart conditions, kidney disease.  She does have a history of asthma, allergic rhinitis.  Would like a refill of her albuterol inhaler.  She has a history of hypertension, takes her blood pressure medication consistently, has been using Coricidin for her symptoms as this is agreeable with her hypertension.  Patient is not a smoker.  No current facility-administered medications for this encounter.  Current Outpatient Medications:    albuterol (VENTOLIN HFA) 108 (90 Base) MCG/ACT inhaler, Inhale 2 puffs into the lungs every 6 (six) hours as needed for wheezing or shortness of breath., Disp: 18 g, Rfl: 1   clobetasol cream (TEMOVATE) 2.54 %, Apply 1 application topically 2 (two) times daily., Disp: 30 g, Rfl: 0   CYMBALTA 30 MG capsule, Take 3 capsules (90 mg total) by mouth daily., Disp: 270 capsule, Rfl: 2   esomeprazole (NEXIUM) 40 MG capsule, TAKE 1 CAPSULE (40 MG TOTAL) BY MOUTH 2 (TWO) TIMES DAILY. QTY/DAY SUPPLY PER INS, Disp: 90 capsule, Rfl: 3   HYDROcodone-acetaminophen (NORCO) 7.5-325 MG tablet, TAKE 1 TABLET Twice a Day. One in the Morning and one at Bedtime, Disp: 60 tablet, Rfl: 0   ibuprofen (ADVIL) 600 MG tablet, TAKE 1 TABLET (600 MG TOTAL) BY MOUTH EVERY 8 (EIGHT) HOURS AS NEEDED FOR MILD PAIN., Disp: 90 tablet, Rfl: 5   ketoconazole (NIZORAL) 2 % cream, Apply 1 application topically daily., Disp: 45 g, Rfl: 1   loratadine (CLARITIN) 10 MG tablet, Take 10 mg by mouth daily., Disp: , Rfl:    meclizine (ANTIVERT) 25 MG tablet, Take 1 tablet (25 mg total) by mouth 3 (three) times daily  as needed for dizziness., Disp: 30 tablet, Rfl: 0   metoCLOPramide (REGLAN) 10 MG tablet, Take 10 mg by mouth 2 (two) times daily., Disp: , Rfl:    montelukast (SINGULAIR) 10 MG tablet, TAKE 1 TABLET BY MOUTH EVERYDAY AT BEDTIME, Disp: 90 tablet, Rfl: 1   olmesartan (BENICAR) 40 MG tablet, TAKE 1 TABLET BY MOUTH EVERY DAY, Disp: 90 tablet, Rfl: 1   SYMBICORT 160-4.5 MCG/ACT inhaler, Inhale into the lungs., Disp: , Rfl:    tiZANidine (ZANAFLEX) 4 MG tablet, TAKE 1 TABLET (4 MG TOTAL) BY MOUTH 4 (FOUR) TIMES DAILY., Disp: 120 tablet, Rfl: 3   topiramate (TOPAMAX) 100 MG tablet, One tablet in the morning and two tablets in the evening., Disp: 90 tablet, Rfl: 5   traMADol (ULTRAM) 50 MG tablet, TAKE 1 TABLET BY MOUTH TWICE A DAY. DIRECTION CHANGE., Disp: 60 tablet, Rfl: 2   traMADol (ULTRAM-ER) 100 MG 24 hr tablet, Take 1 tablet (100 mg total) by mouth daily., Disp: 30 tablet, Rfl: 2   traZODone (DESYREL) 50 MG tablet, TAKE 1.5 TABLETS BY MOUTH AT BEDTIME, Disp: 135 tablet, Rfl: 2   Allergies  Allergen Reactions   Meperidine Anaphylaxis, Itching and Swelling    REACTION: swelling   Cymbalta [Duloxetine Hcl] Hives    Generic makes her feel like she is crawling out of her skin   Diazepam Hives    REACTION: swelling REACTION: swelling   Meperidine Hcl     REACTION: swelling  Meperidine Hcl Hives   Other     Axe Cologne    Past Medical History:  Diagnosis Date   Allergy    Anxiety    Arthritis    "just the norm" (01/24/2013)   ASCVD (arteriosclerotic cardiovascular disease)    Asthma    Celiac disease    Daily headache    "depends on the season" (01/24/2013)   Fibromyalgia    GERD (gastroesophageal reflux disease)    History of colonic polyps    Hyperlipidemia    Hypertension    Migraines    Obesity (BMI 30.0-34.9)    RSD (reflex sympathetic dystrophy)    Sleep apnea    NO MACHINE RECOMMENDED   TIA (transient ischemic attack) ~ 2009     Past Surgical History:  Procedure  Laterality Date   APPENDECTOMY  ~ 06/2007   BLADDER REPAIR  ~ 06/2007   "same day after bladder lift" (01/24/2013)   BLADDER SUSPENSION  ~ 06/2007   BREAST BIOPSY Left    DIAGNOSTIC LAPAROSCOPY  1990's & ~ 2000   "I've had a couple; for endometrosis" (01/24/2013)   [place] N/A 01/24/2013   Procedure: LAPAROSCOPIC INCISIONAL HERNIA;  Surgeon: Harl Bowie, MD;  Location: [facility];  Service: General;  Laterality: N/A;   INSERTION OF MESH N/A 01/24/2013   Procedure: INSERTION OF MESH;  Surgeon: Harl Bowie, MD;  Location: [place];  Service: General;  Laterality: N/A;   LAPAROSCOPIC INCISIONAL / UMBILICAL / [place]  01/24/2013   IHR w/mesh/notes   NASAL SEPTUM SURGERY  1980's?   OOPHORECTOMY Right 2009   TONSILLECTOMY  1990's   VAGINAL HYSTERECTOMY  ` 06/2007    Family History  Problem Relation Age of Onset   Cancer Mother        lung   Hypertension Mother    Cancer Father        colon   Hypertension Sister    Cancer Sister    Cancer Brother    Hyperlipidemia Sister    Birth defects Maternal Grandmother        breast   Breast cancer Maternal Grandmother    Birth defects Paternal Grandmother        uterine, stomach, lung   Breast cancer Paternal Grandmother    Breast cancer Paternal Aunt     Social History   Tobacco Use   Smoking status: Never   Smokeless tobacco: Never  Vaping Use   Vaping Use: Never used  Substance Use Topics   Alcohol use: Not Currently    Alcohol/week: 0.0 standard drinks    Comment: 01/24/2013 "drink or 2 once or /twice/year, if that"   Drug use: No    ROS   Objective:   Vitals: BP (!) 147/85 (BP Location: Right Arm)   Pulse 100   Temp 98.9 F (37.2 C) (Oral)   Resp 18   SpO2 96%   Physical Exam Constitutional:      General: She is not in acute distress.    Appearance: Normal appearance. She is well-developed. She is not ill-appearing, toxic-appearing or diaphoretic.  HENT:     Head:  Normocephalic and atraumatic.     Right Ear: External ear normal.     Left Ear: External ear normal.     Nose: Nose normal.     Mouth/Throat:     Mouth: Mucous membranes are moist.  Eyes:     General: No scleral icterus.  Right eye: No discharge.        Left eye: No discharge.     Extraocular Movements: Extraocular movements intact.     Conjunctiva/sclera: Conjunctivae normal.     Pupils: Pupils are equal, round, and reactive to light.  Cardiovascular:     Rate and Rhythm: Normal rate and regular rhythm.     Pulses: Normal pulses.     Heart sounds: Normal heart sounds. No murmur heard.   No friction rub. No gallop.  Pulmonary:     Effort: Pulmonary effort is normal. No respiratory distress.     Breath sounds: Normal breath sounds. No stridor. No wheezing, rhonchi or rales.  Skin:    General: Skin is warm and dry.     Findings: No rash.  Neurological:     Mental Status: She is alert and oriented to person, place, and time.  Psychiatric:        Mood and Affect: Mood normal.        Behavior: Behavior normal.        Thought Content: Thought content normal.        Judgment: Judgment normal.      Assessment and Plan :   PDMP not reviewed this encounter.  1. Clinical diagnosis of COVID-19   2. Encounter for screening laboratory testing for COVID-19 virus   3. Moderate persistent asthma without complication     Due to possibility of medication interactions with the use of Paxlovid, recommended molnupiravir.  Use supportive care otherwise, refill the albuterol inhaler.  Confirmation COVID-19 testing pending.  Follow-up with PCP. Counseled patient on potential for adverse effects with medications prescribed/recommended today, ER and return-to-clinic precautions discussed, patient verbalized understanding.    Jaynee Eagles, PA-C 06/02/21 1806

## 2021-06-02 NOTE — Telephone Encounter (Signed)
Patient called the office stating that she tested positive for covid today. Patient stated that she did a test yesterday and the line was faint. Patient stated that she has had a headache, sore throat, cough, fever body aches, SOB and difficulty breathing. Patient stated that she would like medication called in and an inhaler. Patient was advise with her symptoms she would really need to be seen. Patient stated that she has not had any covid  vaccines. Patient was advised with her having SOB and difficulty breathing she should have a face to face evaluation so that someone can listen to her lungs. Patient stated that she would like an appointment at the office to be seen.Patient was advised that we can not bring her into the office with her having covid. Patient was advised that she may need a chest x-ray and our equipment is out of service at this time. Patient was given information on the [facility]. Patient confirmed that they know where the UC is located.  Patient stated that she is not happy that it seems that we never have an appointment available when she is sick. Patient stated that she will go ahead and go to the UC shortly.

## 2021-06-03 LAB — NOVEL CORONAVIRUS, NAA: SARS-CoV-2, NAA: DETECTED — AB

## 2021-06-03 LAB — SARS-COV-2, NAA 2 DAY TAT

## 2021-06-03 NOTE — Telephone Encounter (Signed)
Spoke to pt. She does not agree but will go by what Dr Silvio Pate is suggesting. She is taking Coricidin. I suggested Astepro OTC as it is not a steroid spray to see if that would help.

## 2021-06-03 NOTE — Telephone Encounter (Signed)
Spoke to pt. She said she is miserable. Her sinus/nasal passages and very congested. Says she cannot breath out of her nose and when she blows her nose, its green. Asking what can help that and if its possible she has a sinus infection now and needs an antibiotic. Uses [facility].

## 2021-06-10 ENCOUNTER — Encounter: Payer: Medicare HMO | Admitting: Internal Medicine

## 2021-06-12 ENCOUNTER — Encounter: Payer: Self-pay | Admitting: Registered Nurse

## 2021-06-12 ENCOUNTER — Other Ambulatory Visit: Payer: Self-pay

## 2021-06-12 ENCOUNTER — Encounter: Payer: Worker's Compensation | Attending: Registered Nurse | Admitting: Registered Nurse

## 2021-06-12 VITALS — BP 147/85 | HR 100 | Ht 67.0 in | Wt 189.0 lb

## 2021-06-12 DIAGNOSIS — G90512 Complex regional pain syndrome I of left upper limb: Secondary | ICD-10-CM | POA: Diagnosis not present

## 2021-06-12 DIAGNOSIS — G4700 Insomnia, unspecified: Secondary | ICD-10-CM

## 2021-06-12 DIAGNOSIS — Z5181 Encounter for therapeutic drug level monitoring: Secondary | ICD-10-CM

## 2021-06-12 DIAGNOSIS — F3289 Other specified depressive episodes: Secondary | ICD-10-CM

## 2021-06-12 DIAGNOSIS — G894 Chronic pain syndrome: Secondary | ICD-10-CM

## 2021-06-12 DIAGNOSIS — M62838 Other muscle spasm: Secondary | ICD-10-CM

## 2021-06-12 DIAGNOSIS — Z79891 Long term (current) use of opiate analgesic: Secondary | ICD-10-CM

## 2021-06-12 MED ORDER — HYDROCODONE-ACETAMINOPHEN 7.5-325 MG PO TABS: ORAL_TABLET | ORAL | 0 refills | Status: DC

## 2021-06-12 MED ORDER — TRAMADOL HCL 50 MG PO TABS: ORAL_TABLET | ORAL | 2 refills | Status: DC

## 2021-06-12 MED ORDER — TRAMADOL HCL ER 100 MG PO TB24: 100.0000 mg | ORAL_TABLET | Freq: Every day | ORAL | 2 refills | Status: DC

## 2021-06-12 NOTE — Progress Notes (Signed)
Subjective:    Patient ID: [name], female    DOB: 1966-08-27, 55 y.o.   MRN: [id number]  HPI: [name] is a 55 y.o. female whose appointment was changed to a My-Chart Video visit, she was diagnosed with COVID. Ms. Devora agrees with My Chart Video Visit. She  states her pain is located in her left wrist and left arm. She rates her pain 8. Her current exercise regime is walking and performing stretching exercises.   Ms. Yanni Morphine equivalent is 35.67 MME.   Last UDS was Performed 12/15/2020, it was consistent.   Pain Inventory Average Pain 7 Pain Right Now 8 My pain is constant, sharp, burning, dull, stabbing, tingling, and aching  In the last 24 hours, has pain interfered with the following? General activity 4 Relation with others 4 Enjoyment of life 4 What TIME of day is your pain at its worst? morning , daytime, evening, and night Sleep (in general) Poor  Pain is worse with: walking, bending, sitting, inactivity, standing, and some activites Pain improves with: rest and medication Relief from Meds: 6  Family History  Problem Relation Age of Onset   Cancer Mother        lung   Hypertension Mother    Cancer Father        colon   Hypertension Sister    Cancer Sister    Cancer Brother    Hyperlipidemia Sister    Birth defects Maternal Grandmother        breast   Breast cancer Maternal Grandmother    Birth defects Paternal Grandmother        uterine, stomach, lung   Breast cancer Paternal Grandmother    Breast cancer Paternal Aunt    Social History   Socioeconomic History   Marital status: Married    Spouse name: Not on file   Number of children: 2   Years of education: Not on file   Highest education level: Not on file  Occupational History   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  Tobacco Use   Smoking status: Never   Smokeless tobacco: Never  Vaping Use   Vaping Use: Never used  Substance and Sexual Activity    Alcohol use: Not Currently    Alcohol/week: 0.0 standard drinks    Comment: 01/24/2013 "drink or 2 once or /twice/year, if that"   Drug use: No   Sexual activity: Not Currently  Other Topics Concern   Not on file  Social History Narrative   No living will   Would want husband as POA--then sister, Neoma Laming   Would accept resuscitation attempts   Probably would not want tube feeds if cognitively unaware   Social Determinants of Health   Financial Resource Strain: Not on file  Food Insecurity: Not on file  Transportation Needs: Not on file  Physical Activity: Not on file  Stress: Not on file  Social Connections: Not on file   Past Surgical History:  Procedure Laterality Date   APPENDECTOMY  ~ 06/2007   [place]  ~ 06/2007   "same day after bladder lift" (01/24/2013)   BLADDER SUSPENSION  ~ 06/2007   BREAST BIOPSY Left    DIAGNOSTIC LAPAROSCOPY  1990's & ~ 2000   "I've had a couple; for endometrosis" (01/24/2013)   [place] N/A 01/24/2013   Procedure: LAPAROSCOPIC INCISIONAL HERNIA;  Surgeon: Harl Bowie, MD;  Location: [facility] [place];  Service: General;  Laterality: N/A;  INSERTION OF MESH N/A 01/24/2013   Procedure: INSERTION OF MESH;  Surgeon: Harl Bowie, MD;  Location: [place];  Service: General;  Laterality: N/A;   LAPAROSCOPIC INCISIONAL / UMBILICAL / [place]  01/24/2013   IHR w/mesh/notes   NASAL SEPTUM SURGERY  1980's?   OOPHORECTOMY Right 2009   TONSILLECTOMY  1990's   VAGINAL HYSTERECTOMY  ` 06/2007   Past Surgical History:  Procedure Laterality Date   APPENDECTOMY  ~ 06/2007   BLADDER REPAIR  ~ 06/2007   "same day after bladder lift" (01/24/2013)   BLADDER SUSPENSION  ~ 06/2007   BREAST BIOPSY Left    DIAGNOSTIC LAPAROSCOPY  1990's & ~ 2000   "I've had a couple; for endometrosis" (01/24/2013)   [place] N/A 01/24/2013   Procedure: LAPAROSCOPIC INCISIONAL HERNIA;  Surgeon: Harl Bowie, MD;  Location: [place];  Service: General;  Laterality: N/A;   INSERTION OF MESH N/A 01/24/2013   Procedure: INSERTION OF MESH;  Surgeon: Harl Bowie, MD;  Location: [place];  Service: General;  Laterality: N/A;   LAPAROSCOPIC INCISIONAL / UMBILICAL / [place]  01/24/2013   IHR w/mesh/notes   NASAL SEPTUM SURGERY  1980's?   OOPHORECTOMY Right 2009   TONSILLECTOMY  1990's   VAGINAL HYSTERECTOMY  ` 06/2007   Past Medical History:  Diagnosis Date   Allergy    Anxiety    Arthritis    "just the norm" (01/24/2013)   ASCVD (arteriosclerotic cardiovascular disease)    Asthma    Celiac disease    Daily headache    "depends on the season" (01/24/2013)   Fibromyalgia    GERD (gastroesophageal reflux disease)    History of colonic polyps    Hyperlipidemia    Hypertension    Migraines    Obesity (BMI 30.0-34.9)    RSD (reflex sympathetic dystrophy)    Sleep apnea    NO MACHINE RECOMMENDED   TIA (transient ischemic attack) ~ 2009   BP (!) 147/85 Comment: last recorded  Pulse 100 Comment: last recorded  Ht 5' 7"  (1.702 m) Comment: last recorded  Wt 189 lb (85.7 kg) Comment: last recorded  BMI 29.60 kg/m   Opioid Risk Score:   Fall Risk Score:  `1  Depression screen PHQ 2/9  Depression screen [facility] 2/9 06/12/2021 05/12/2021 04/14/2021 03/13/2021 12/15/2020 11/17/2020 09/16/2020  Decreased Interest 1 1 1 1 1 1 1   Down, Depressed, Hopeless 1 1 1 1 1 1  -  PHQ - 2 Score 2 2 2 2 2 2 1   Altered sleeping - - - - - - -  Tired, decreased energy - - - - - - -  Change in appetite - - - - - - -  Feeling bad or failure about yourself  - - - - - - -  Trouble concentrating - - - - - - -  Moving slowly or fidgety/restless - - - - - - -  Suicidal thoughts - - - - - - -  PHQ-9 Score - - - - - - -  Difficult doing work/chores - - - - - - -  Some recent data might be hidden     Review of Systems  Constitutional:  Positive for fatigue and fever.  HENT:  Positive for congestion.    Eyes: Negative.   Respiratory:  Positive for cough.   Cardiovascular: Negative.   Gastrointestinal: Negative.   Endocrine: Negative.  Genitourinary: Negative.   Musculoskeletal:        Pain in left arm  Skin: Negative.   Allergic/Immunologic: Negative.   Neurological:  Positive for weakness.  Hematological: Negative.   Psychiatric/Behavioral:  Positive for dysphoric mood.   All other systems reviewed and are negative.     Objective:   Physical Exam Vitals and nursing note reviewed.  Musculoskeletal:     Comments: No Physical Exam Perform: My-Chart Video Visit          Assessment & Plan:  1.Complex Regional Pain Syndrome Type 1: Continue Topamax and Cymbalta. 06/12/2021. Refilled: Hydro-codone 7.5/325mg one tablet twice a day one in the morning and one at bedtime #60. Continue Tramadol 100 mg ER daily #30 and Tramadol 50 mg one tablet by mouth BID. One tablet in the afternoon and one tablet in the evening. We will continue the opioid monitoring program, this consists of regular clinic visits, examinations, urine drug screen, pill counts as well as use of [place] Controlled Substance Reporting system. A 12 month History has been reviewed on the [place] Controlled Substance Reporting System  On 06/12/2021. 2. Depression: Continue current medication regimen with Cymbalta.06/12/2021 3. Insomnia: Continue current medication regimen with  Trazodone one and half tablet at bedtime  05/12/2021 4. Muscle Spasms: Continue current medication regimen with Tizanidine. 06/12/2021 5. Chronic Pain Syndrome: Continue Ibuprofen BID as needed. 06/12/2021     F/U in 1 month  My-Chart Video Visit Established Patient Location of Patient: In her Home Location of Provider: In the Office

## 2021-06-15 ENCOUNTER — Other Ambulatory Visit: Payer: Self-pay

## 2021-06-15 ENCOUNTER — Ambulatory Visit (HOSPITAL_COMMUNITY)
Admission: RE | Admit: 2021-06-15 | Discharge: 2021-06-15 | Disposition: A | Discharge status: Home / Self Care | Payer: Medicare HMO | Source: Ambulatory Visit | Attending: Gastroenterology | Admitting: Gastroenterology

## 2021-06-15 DIAGNOSIS — R197 Diarrhea, unspecified: Secondary | ICD-10-CM | POA: Diagnosis not present

## 2021-06-15 DIAGNOSIS — R110 Nausea: Secondary | ICD-10-CM | POA: Diagnosis not present

## 2021-06-15 DIAGNOSIS — R1013 Epigastric pain: Secondary | ICD-10-CM

## 2021-06-15 DIAGNOSIS — R1011 Right upper quadrant pain: Secondary | ICD-10-CM | POA: Diagnosis not present

## 2021-06-15 MED ORDER — TECHNETIUM TC 99M MEBROFENIN IV KIT
5.3000 | PACK | Freq: Once | INTRAVENOUS | Status: AC | PRN
  Administered 2021-06-15: 5.3 via INTRAVENOUS

## 2021-06-19 ENCOUNTER — Ambulatory Visit (INDEPENDENT_AMBULATORY_CARE_PROVIDER_SITE_OTHER): Payer: Medicare HMO | Admitting: Internal Medicine

## 2021-06-19 ENCOUNTER — Other Ambulatory Visit: Payer: Self-pay

## 2021-06-19 ENCOUNTER — Encounter: Payer: Self-pay | Admitting: Internal Medicine

## 2021-06-19 VITALS — BP 128/86 | HR 94 | Temp 97.8°F | Ht 67.0 in | Wt 188.0 lb

## 2021-06-19 DIAGNOSIS — F1120 Opioid dependence, uncomplicated: Secondary | ICD-10-CM

## 2021-06-19 DIAGNOSIS — Z23 Encounter for immunization: Secondary | ICD-10-CM | POA: Diagnosis not present

## 2021-06-19 DIAGNOSIS — I10 Essential (primary) hypertension: Secondary | ICD-10-CM | POA: Diagnosis not present

## 2021-06-19 DIAGNOSIS — F39 Unspecified mood [affective] disorder: Secondary | ICD-10-CM

## 2021-06-19 DIAGNOSIS — R7303 Prediabetes: Secondary | ICD-10-CM | POA: Diagnosis not present

## 2021-06-19 DIAGNOSIS — Z0000 Encounter for general adult medical examination without abnormal findings: Secondary | ICD-10-CM

## 2021-06-19 DIAGNOSIS — J4520 Mild intermittent asthma, uncomplicated: Secondary | ICD-10-CM

## 2021-06-19 DIAGNOSIS — G3184 Mild cognitive impairment, so stated: Secondary | ICD-10-CM | POA: Diagnosis not present

## 2021-06-19 DIAGNOSIS — R69 Illness, unspecified: Secondary | ICD-10-CM | POA: Diagnosis not present

## 2021-06-19 LAB — HEPATIC FUNCTION PANEL
ALT: 18 U/L (ref 0–35)
AST: 25 U/L (ref 0–37)
Albumin: 4.2 g/dL (ref 3.5–5.2)
Alkaline Phosphatase: 104 U/L (ref 39–117)
Bilirubin, Direct: 0.1 mg/dL (ref 0.0–0.3)
Total Bilirubin: 0.4 mg/dL (ref 0.2–1.2)
Total Protein: 7 g/dL (ref 6.0–8.3)

## 2021-06-19 LAB — RENAL FUNCTION PANEL
Albumin: 4.2 g/dL (ref 3.5–5.2)
BUN: 15 mg/dL (ref 6–23)
CO2: 23 mEq/L (ref 19–32)
Calcium: 9.3 mg/dL (ref 8.4–10.5)
Chloride: 106 mEq/L (ref 96–112)
Creatinine, Ser: 0.82 mg/dL (ref 0.40–1.20)
GFR: 79.5 mL/min (ref >60.00)
Glucose, Bld: 181 mg/dL — ABNORMAL HIGH (ref 70–99)
Phosphorus: 2.9 mg/dL (ref 2.3–4.6)
Potassium: 4 mEq/L (ref 3.5–5.1)
Sodium: 139 mEq/L (ref 135–145)

## 2021-06-19 LAB — CBC
HCT: 40.4 % (ref 36.0–46.0)
Hemoglobin: 12.9 g/dL (ref 12.0–15.0)
MCHC: 31.8 g/dL (ref 30.0–36.0)
MCV: 88.9 fl (ref 78.0–100.0)
Platelets: 248 10*3/uL (ref 150.0–400.0)
RBC: 4.54 Mil/uL (ref 3.87–5.11)
RDW: 14.4 % (ref 11.5–15.5)
WBC: 10.3 10*3/uL (ref 4.0–10.5)

## 2021-06-19 LAB — HEMOGLOBIN A1C: Hgb A1c MFr Bld: 9.1 % — ABNORMAL HIGH (ref 4.6–6.5)

## 2021-06-19 MED ORDER — ESOMEPRAZOLE MAGNESIUM 40 MG PO CPDR: 40.0000 mg | DELAYED_RELEASE_CAPSULE | Freq: Every day | ORAL | 3 refills | Status: DC

## 2021-06-19 MED ORDER — SYMBICORT 160-4.5 MCG/ACT IN AERO: 2.0000 | INHALATION_SPRAY | Freq: Every day | RESPIRATORY_TRACT | 11 refills | Status: DC

## 2021-06-19 NOTE — Assessment & Plan Note (Signed)
Some cough, etc since COVID basically controlled on albuterol Will restart the symbicort

## 2021-06-19 NOTE — Assessment & Plan Note (Signed)
Managed by the pain doctor

## 2021-06-19 NOTE — Assessment & Plan Note (Signed)
BP Readings from Last 3 Encounters:  06/19/21 128/86  06/12/21 (!) 147/85  06/02/21 (!) 147/85   Okay on olmesartan

## 2021-06-19 NOTE — Assessment & Plan Note (Signed)
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

## 2021-06-19 NOTE — Progress Notes (Signed)
Subjective:    Patient ID: [name], female    DOB: [birth year], 55 y.o.   MRN: [id number]  HPI Here for Medicare wellness visit and follow up of chronic health conditions This visit occurred during the SARS-CoV-2 public health emergency.  Safety protocols were in place, including screening questions prior to the visit, additional usage of staff PPE, and extensive cleaning of exam room while observing appropriate contact time as indicated for disinfecting solutions.   Reviewed form and advanced directives Reviewed other doctors No alcohol or tobacco Not exercising--has chronic pain. 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 No injuries Chronic depression Tries to help with housework Some memory issues--nothing different from the past  Recent COVID Still weak and some cough but does feel better  Chronic pain is about the same Continues with pain med---on worker's comp Feet now worse than arms Norco, tramadol, cymbalta  Breathing is improved Some ongoing SOB from last spell (when was worried about her gallbladder) Some cough EGD was negative--so sent for HIDA scan (had pain and SOB) Reviewed result--HIDA normal Some heartburn---nexium and reglan. Reducing nexium to daily Some dysphagia at times-- food (if reflux flares)  Occasional palpitations---aware of her heart.  At rest or with activity. No SOB with this in particular No dizziness or syncope Some edema in left foot at times--related to CRPS  Tries to eat healthy Elevated sugars in the past Weight is up some from last year  Current Outpatient Medications on File Prior to Visit  Medication Sig Dispense Refill   albuterol (VENTOLIN HFA) 108 (90 Base) MCG/ACT inhaler Inhale 2 puffs into the lungs every 6 (six) hours as needed for wheezing or shortness of breath. 18 g 1   clobetasol cream (TEMOVATE) 1.57 % Apply 1  application topically 2 (two) times daily. 30 g 0   CYMBALTA 30 MG capsule Take 3 capsules (90 mg total) by mouth daily. 270 capsule 2   esomeprazole (NEXIUM) 40 MG capsule TAKE 1 CAPSULE (40 MG TOTAL) BY MOUTH 2 (TWO) TIMES DAILY. QTY/DAY SUPPLY PER INS 90 capsule 3   HYDROcodone-acetaminophen (NORCO) 7.5-325 MG tablet TAKE 1 TABLET Twice a Day. One in the Morning and one at Bedtime. Do Not Fill Before 07/10/2021 60 tablet 0   ibuprofen (ADVIL) 600 MG tablet TAKE 1 TABLET (600 MG TOTAL) BY MOUTH EVERY 8 (EIGHT) HOURS AS NEEDED FOR MILD PAIN. 90 tablet 5   ketoconazole (NIZORAL) 2 % cream Apply 1 application topically daily. 45 g 1   loratadine (CLARITIN) 10 MG tablet Take 10 mg by mouth daily.     meclizine (ANTIVERT) 25 MG tablet Take 1 tablet (25 mg total) by mouth 3 (three) times daily as needed for dizziness. 30 tablet 0   metoCLOPramide (REGLAN) 10 MG tablet Take 10 mg by mouth 2 (two) times daily.     montelukast (SINGULAIR) 10 MG tablet TAKE 1 TABLET BY MOUTH EVERYDAY AT BEDTIME 90 tablet 1   olmesartan (BENICAR) 40 MG tablet TAKE 1 TABLET BY MOUTH EVERY DAY 90 tablet 1   SYMBICORT 160-4.5 MCG/ACT inhaler Inhale into the lungs.     tiZANidine (ZANAFLEX) 4 MG tablet TAKE 1 TABLET (4 MG TOTAL) BY MOUTH 4 (FOUR) TIMES DAILY. 120 tablet 3   topiramate (TOPAMAX) 100 MG tablet One tablet in the morning and two tablets in the evening. 90 tablet 5   traMADol (ULTRAM) 50 MG tablet TAKE 1  TABLET BY MOUTH TWICE A DAY. DIRECTION CHANGE. 60 tablet 2   traMADol (ULTRAM-ER) 100 MG 24 hr tablet Take 1 tablet (100 mg total) by mouth daily. 30 tablet 2   traZODone (DESYREL) 50 MG tablet TAKE 1.5 TABLETS BY MOUTH AT BEDTIME 135 tablet 2   [DISCONTINUED] losartan (COZAAR) 100 MG tablet TAKE 1 TABLET BY MOUTH EVERY DAY 90 tablet 0   No current facility-administered medications on file prior to visit.    Allergies  Allergen Reactions   Meperidine Anaphylaxis, Itching and Swelling    REACTION: swelling    Cymbalta [Duloxetine Hcl] Hives    Generic makes her feel like she is crawling out of her skin   Diazepam Hives    REACTION: swelling   Meperidine Hcl     REACTION: swelling   Meperidine Hcl Hives   Other     Axe Cologne    Past Medical History:  Diagnosis Date   Allergy    Anxiety    Arthritis    "just the norm" (01/24/2013)   ASCVD (arteriosclerotic cardiovascular disease)    Asthma    Celiac disease    Daily headache    "depends on the season" (01/24/2013)   Fibromyalgia    GERD (gastroesophageal reflux disease)    History of colonic polyps    Hyperlipidemia    Hypertension    Migraines    Obesity (BMI 30.0-34.9)    RSD (reflex sympathetic dystrophy)    Sleep apnea    NO MACHINE RECOMMENDED   TIA (transient ischemic attack) ~ 2009    Past Surgical History:  Procedure Laterality Date   APPENDECTOMY  ~ 06/2007   BLADDER REPAIR  ~ 06/2007   "same day after bladder lift" (01/24/2013)   BLADDER SUSPENSION  ~ 06/2007   BREAST BIOPSY Left    DIAGNOSTIC LAPAROSCOPY  1990's & ~ 2000   "I've had a couple; for endometrosis" (01/24/2013)   [place] N/A 01/24/2013   Procedure: LAPAROSCOPIC INCISIONAL HERNIA;  Surgeon: Harl Bowie, MD;  Location: [place];  Service: General;  Laterality: N/A;   INSERTION OF MESH N/A 01/24/2013   Procedure: INSERTION OF MESH;  Surgeon: Harl Bowie, MD;  Location: [place];  Service: General;  Laterality: N/A;   LAPAROSCOPIC INCISIONAL / UMBILICAL / [place]  01/24/2013   IHR w/mesh/notes   NASAL SEPTUM SURGERY  1980's?   OOPHORECTOMY Right 2009   TONSILLECTOMY  1990's   VAGINAL HYSTERECTOMY  ` 06/2007    Family History  Problem Relation Age of Onset   Cancer Mother        lung   Hypertension Mother    Cancer Father        colon   Hypertension Sister    Cancer Sister    Cancer Brother    Hyperlipidemia Sister    Birth defects Maternal Grandmother        breast   Breast cancer Maternal  Grandmother    Birth defects Paternal Grandmother        uterine, stomach, lung   Breast cancer Paternal Grandmother    Breast cancer Paternal Aunt     Social History   Socioeconomic History   Marital status: Married    Spouse name: Not on file   Number of children: 2   Years of education: Not on file   Highest education level: Not on file  Occupational History   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  Tobacco Use   Smoking status: Never   Smokeless tobacco: Never  Vaping Use   Vaping Use: Never used  Substance and Sexual Activity   Alcohol use: Not Currently    Alcohol/week: 0.0 standard drinks    Comment: 01/24/2013 "drink or 2 once or /twice/year, if that"   Drug use: No   Sexual activity: Not Currently  Other Topics Concern   Not on file  Social History Narrative   No living will   Would want husband as POA--then sister, Neoma Laming   Would accept resuscitation attempts   Probably would not want tube feeds if cognitively unaware   Social Determinants of Health   Financial Resource Strain: Not on file  Food Insecurity: Not on file  Transportation Needs: Not on file  Physical Activity: Not on file  Stress: Not on file  Social Connections: Not on file  Intimate Partner Violence: Not on file     Review of Systems Not sleeping well--chronic. Does take trazodone Wears seat belt Teeth okay--overdue for dentist No suspicious skin lesions Some joint pains as well as CRPS Bowels are slow--her normal. Occasional blood on toilet paper No dysuria or hematuria. No incontinence     Objective:   Physical Exam HENT:     Mouth/Throat:     Comments: No lesions Eyes:     Conjunctiva/sclera: Conjunctivae normal.     Pupils: Pupils are equal, round, and reactive to light.  Abdominal:     Palpations: Abdomen is soft.     Tenderness: There is no abdominal tenderness.  Neurological:     Mental Status: She is oriented to person, place, and time.     Comments: President---"Some  jackass, Trump, ?" [id number]??? [id number] Recall 1/3  Psychiatric:        Mood and Affect: Mood normal.        Behavior: Behavior normal.           Assessment & Plan:

## 2021-06-19 NOTE — Addendum Note (Signed)
Addended by: Dalia Heading R on: 06/19/2021 11:49 AM   Modules accepted: Orders

## 2021-06-19 NOTE — Assessment & Plan Note (Signed)
No functional problems Will just monitor

## 2021-06-19 NOTE — Assessment & Plan Note (Signed)
Will check labs

## 2021-06-19 NOTE — Progress Notes (Signed)
Hearing Screening   500Hz  1000Hz  2000Hz  4000Hz   Right ear 0 0 20 25  Left ear 0 0 20 20   Vision Screening   Right eye Left eye Both eyes  Without correction 20/25 20/25 20/25   With correction

## 2021-06-19 NOTE — Assessment & Plan Note (Signed)
I have personally reviewed the Medicare Annual Wellness questionnaire and have noted 1. The patient's medical and social history 2. Their use of alcohol, tobacco or illicit drugs 3. Their current medications and supplements 4. The patient's functional ability including ADL's, fall risks, home safety risks and hearing or visual             impairment. 5. Diet and physical activities 6. Evidence for depression or mood disorders  The patients weight, height, BMI and visual acuity have been recorded in the chart I have made referrals, counseling and provided education to the patient based review of the above and I have provided the pt with a written personalized care plan for preventive services.  I have provided you with a copy of your personalized plan for preventive services. Please take the time to review along with your updated medication list.  Recent colon--due again in 3 years Yearly mammogram--due in May Doesn't want COVID vaccine Flu vaccine today Consider shingrix when covered

## 2021-06-30 DIAGNOSIS — R1013 Epigastric pain: Secondary | ICD-10-CM | POA: Diagnosis not present

## 2021-06-30 DIAGNOSIS — K219 Gastro-esophageal reflux disease without esophagitis: Secondary | ICD-10-CM | POA: Diagnosis not present

## 2021-07-03 ENCOUNTER — Telehealth: Payer: Self-pay

## 2021-07-03 NOTE — Telephone Encounter (Signed)
Labs state they have been viewed on MyChart. I left a message for pt to call the office to set up an appt to discuss new onset diabetes.

## 2021-07-11 ENCOUNTER — Other Ambulatory Visit: Payer: Self-pay | Admitting: Registered Nurse

## 2021-07-16 ENCOUNTER — Other Ambulatory Visit: Payer: Self-pay | Admitting: Internal Medicine

## 2021-07-22 ENCOUNTER — Encounter: Payer: Self-pay | Admitting: Internal Medicine

## 2021-07-22 ENCOUNTER — Ambulatory Visit (INDEPENDENT_AMBULATORY_CARE_PROVIDER_SITE_OTHER): Payer: Medicare HMO | Admitting: Internal Medicine

## 2021-07-22 ENCOUNTER — Other Ambulatory Visit: Payer: Self-pay

## 2021-07-22 VITALS — BP 104/78 | HR 90 | Temp 97.8°F | Ht 67.0 in | Wt 185.0 lb

## 2021-07-22 DIAGNOSIS — E1159 Type 2 diabetes mellitus with other circulatory complications: Secondary | ICD-10-CM | POA: Insufficient documentation

## 2021-07-22 DIAGNOSIS — E119 Type 2 diabetes mellitus without complications: Secondary | ICD-10-CM

## 2021-07-22 MED ORDER — METFORMIN HCL ER 500 MG PO TB24: 1000.0000 mg | ORAL_TABLET | Freq: Every day | ORAL | 3 refills | Status: DC

## 2021-07-22 NOTE — Assessment & Plan Note (Signed)
Will set up with diabetic counseling Start metformin ER 594m--- 2 in AM or 1 bid----discussed GI side effects See back in 3 months

## 2021-07-22 NOTE — Patient Instructions (Signed)
Please start the metformin with one tab at breakfast. If you have no intestinal problems or diarrhea after 1 week, increase to 2 daily (both at breakfast or split with breakfast and supper).

## 2021-07-22 NOTE — Progress Notes (Signed)
Subjective:    Patient ID: [name], female    DOB: 1965/12/12, 55 y.o.   MRN: [id number]  HPI Here to discuss new diabetes This visit occurred during the SARS-CoV-2 public health emergency.  Safety protocols were in place, including screening questions prior to the visit, additional usage of staff PPE, and extensive cleaning of exam room while observing appropriate contact time as indicated for disinfecting solutions.   She has tried to cut out sodas Weight is about the same Not able to exercise much---discussed  Current Outpatient Medications on File Prior to Visit  Medication Sig Dispense Refill   albuterol (VENTOLIN HFA) 108 (90 Base) MCG/ACT inhaler Inhale 2 puffs into the lungs every 6 (six) hours as needed for wheezing or shortness of breath. 18 g 1   clobetasol cream (TEMOVATE) 4.08 % Apply 1 application topically 2 (two) times daily. 30 g 0   CYMBALTA 30 MG capsule Take 3 capsules (90 mg total) by mouth daily. 270 capsule 2   esomeprazole (NEXIUM) 40 MG capsule Take 1 capsule (40 mg total) by mouth daily. 90 capsule 3   HYDROcodone-acetaminophen (NORCO) 7.5-325 MG tablet TAKE 1 TABLET Twice a Day. One in the Morning and one at Bedtime. Do Not Fill Before 07/10/2021 60 tablet 0   ibuprofen (ADVIL) 600 MG tablet TAKE 1 TABLET (600 MG TOTAL) BY MOUTH EVERY 8 (EIGHT) HOURS AS NEEDED FOR MILD PAIN. 90 tablet 5   ketoconazole (NIZORAL) 2 % cream Apply 1 application topically daily. 45 g 1   loratadine (CLARITIN) 10 MG tablet Take 10 mg by mouth daily.     meclizine (ANTIVERT) 25 MG tablet Take 1 tablet (25 mg total) by mouth 3 (three) times daily as needed for dizziness. 30 tablet 0   metoCLOPramide (REGLAN) 10 MG tablet Take 10 mg by mouth 2 (two) times daily.     montelukast (SINGULAIR) 10 MG tablet TAKE 1 TABLET BY MOUTH EVERYDAY AT BEDTIME 90 tablet 3   olmesartan (BENICAR) 40 MG tablet TAKE 1 TABLET BY MOUTH EVERY DAY 90 tablet 3   SYMBICORT 160-4.5 MCG/ACT inhaler  Inhale 2 puffs into the lungs daily. 1 each 11   tiZANidine (ZANAFLEX) 4 MG tablet TAKE 1 TABLET (4 MG TOTAL) BY MOUTH 4 (FOUR) TIMES DAILY. 120 tablet 3   topiramate (TOPAMAX) 100 MG tablet TAKE 1 TABLET BY MOUTH EVERY MORNING AND TAKE 2 TABLETS BY MOUTH EVERY EVENING 90 tablet 5   traMADol (ULTRAM) 50 MG tablet TAKE 1 TABLET BY MOUTH TWICE A DAY. DIRECTION CHANGE. 60 tablet 2   traMADol (ULTRAM-ER) 100 MG 24 hr tablet Take 1 tablet (100 mg total) by mouth daily. 30 tablet 2   traZODone (DESYREL) 50 MG tablet TAKE 1.5 TABLETS BY MOUTH AT BEDTIME 135 tablet 2   [DISCONTINUED] losartan (COZAAR) 100 MG tablet TAKE 1 TABLET BY MOUTH EVERY DAY 90 tablet 0   No current facility-administered medications on file prior to visit.    Allergies  Allergen Reactions   Meperidine Anaphylaxis, Itching and Swelling    REACTION: swelling   Cymbalta [Duloxetine Hcl] Hives    Generic makes her feel like she is crawling out of her skin   Diazepam Hives    REACTION: swelling   Meperidine Hcl     REACTION: swelling   Meperidine Hcl Hives   Other     Axe Cologne    Past Medical History:  Diagnosis Date   Allergy    Anxiety    Arthritis    "  just the norm" (01/24/2013)   ASCVD (arteriosclerotic cardiovascular disease)    Asthma    Celiac disease    Daily headache    "depends on the season" (01/24/2013)   Fibromyalgia    GERD (gastroesophageal reflux disease)    History of colonic polyps    Hyperlipidemia    Hypertension    Migraines    Obesity (BMI 30.0-34.9)    RSD (reflex sympathetic dystrophy)    Sleep apnea    NO MACHINE RECOMMENDED   TIA (transient ischemic attack) ~ 2009    Past Surgical History:  Procedure Laterality Date   APPENDECTOMY  ~ 06/2007   BLADDER REPAIR  ~ 06/2007   "same day after bladder lift" (01/24/2013)   BLADDER SUSPENSION  ~ 06/2007   BREAST BIOPSY Left    DIAGNOSTIC LAPAROSCOPY  1990's & ~ 2000   "I've had a couple; for endometrosis" (01/24/2013)   [place] N/A 01/24/2013   Procedure: LAPAROSCOPIC INCISIONAL HERNIA;  Surgeon: Harl Bowie, MD;  Location: [facility];  Service: General;  Laterality: N/A;   INSERTION OF MESH N/A 01/24/2013   Procedure: INSERTION OF MESH;  Surgeon: Harl Bowie, MD;  Location: [facility];  Service: General;  Laterality: N/A;   LAPAROSCOPIC INCISIONAL / UMBILICAL / [place]  01/24/2013   IHR w/mesh/notes   NASAL SEPTUM SURGERY  1980's?   OOPHORECTOMY Right 2009   TONSILLECTOMY  1990's   VAGINAL HYSTERECTOMY  ` 06/2007    Family History  Problem Relation Age of Onset   Cancer Mother        lung   Hypertension Mother    Cancer Father        colon   Hypertension Sister    Cancer Sister    Cancer Brother    Hyperlipidemia Sister    Birth defects Maternal Grandmother        breast   Breast cancer Maternal Grandmother    Birth defects Paternal Grandmother        uterine, stomach, lung   Breast cancer Paternal Grandmother    Breast cancer Paternal Aunt     Social History   Socioeconomic History   Marital status: Married    Spouse name: Not on file   Number of children: 2   Years of education: Not on file   Highest education level: Not on file  Occupational History   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  Tobacco Use   Smoking status: Never   Smokeless tobacco: Never  Vaping Use   Vaping Use: Never used  Substance and Sexual Activity   Alcohol use: Not Currently    Alcohol/week: 0.0 standard drinks    Comment: 01/24/2013 "drink or 2 once or /twice/year, if that"   Drug use: No   Sexual activity: Not Currently  Other Topics Concern   Not on file  Social History Narrative   No living will   Would want husband as POA--then sister, Neoma Laming   Would accept resuscitation attempts   Probably would not want tube feeds if cognitively unaware   Social Determinants of Health   Financial Resource Strain: Not on file  Food Insecurity: Not on file  Transportation Needs: Not  on file  Physical Activity: Not on file  Stress: Not on file  Social Connections: Not on file  Intimate Partner Violence: Not on file   Review of Systems Intiates sleep okay with trazodone. Still awakens frequently Does have fast  food a fair bit---discussed     Objective:   Physical Exam         Assessment & Plan:

## 2021-07-28 ENCOUNTER — Other Ambulatory Visit: Payer: Self-pay

## 2021-07-28 ENCOUNTER — Encounter: Payer: Worker's Compensation | Attending: Registered Nurse | Admitting: Registered Nurse

## 2021-07-28 VITALS — BP 142/91 | HR 90 | Temp 98.7°F | Ht 67.0 in | Wt 184.0 lb

## 2021-07-28 DIAGNOSIS — G894 Chronic pain syndrome: Secondary | ICD-10-CM

## 2021-07-28 DIAGNOSIS — Z5181 Encounter for therapeutic drug level monitoring: Secondary | ICD-10-CM | POA: Diagnosis present

## 2021-07-28 DIAGNOSIS — M62838 Other muscle spasm: Secondary | ICD-10-CM | POA: Diagnosis present

## 2021-07-28 DIAGNOSIS — G4700 Insomnia, unspecified: Secondary | ICD-10-CM

## 2021-07-28 DIAGNOSIS — F3289 Other specified depressive episodes: Secondary | ICD-10-CM | POA: Diagnosis present

## 2021-07-28 DIAGNOSIS — G90512 Complex regional pain syndrome I of left upper limb: Secondary | ICD-10-CM

## 2021-07-28 DIAGNOSIS — Z79891 Long term (current) use of opiate analgesic: Secondary | ICD-10-CM

## 2021-07-28 MED ORDER — HYDROCODONE-ACETAMINOPHEN 7.5-325 MG PO TABS: ORAL_TABLET | ORAL | 0 refills | Status: DC

## 2021-07-28 MED ORDER — TRAMADOL HCL ER 100 MG PO TB24: 100.0000 mg | ORAL_TABLET | Freq: Every day | ORAL | 2 refills | Status: DC

## 2021-07-28 MED ORDER — TRAMADOL HCL 50 MG PO TABS: ORAL_TABLET | ORAL | 2 refills | Status: DC

## 2021-07-28 NOTE — Progress Notes (Signed)
Subjective:    Patient ID: [name], female    DOB: 1966-04-19, 55 y.o.   MRN: [id number]  HPI: [name] is a 55 y.o. female who returns for follow up appointment for chronic pain and medication refill. She states her pain is located in her left arm and left hand. She rates her pain 6. Her current exercise regime is walking and performing stretching exercises.  [name] Morphine equivalent is 35.00 MME.   Oral Swab was Performed today.     Pain Inventory Average Pain 7 Pain Right Now 6 My pain is constant, sharp, burning, dull, stabbing, tingling, and aching  In the last 24 hours, has pain interfered with the following? General activity 6 Relation with others 6 Enjoyment of life 6 What TIME of day is your pain at its worst? morning , daytime, evening, and night Sleep (in general) Poor  Pain is worse with: walking, bending, sitting, inactivity, standing, and some activites Pain improves with: rest and medication Relief from Meds: 6  Family History  Problem Relation Age of Onset   Cancer Mother        lung   Hypertension Mother    Cancer Father        colon   Hypertension Sister    Cancer Sister    Cancer Brother    Hyperlipidemia Sister    Birth defects Maternal Grandmother        breast   Breast cancer Maternal Grandmother    Birth defects Paternal Grandmother        uterine, stomach, lung   Breast cancer Paternal Grandmother    Breast cancer Paternal Aunt    Social History   Socioeconomic History   Marital status: Married    Spouse name: Not on file   Number of children: 2   Years of education: Not on file   Highest education level: Not on file  Occupational History   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  Tobacco Use   Smoking status: Never   Smokeless tobacco: Never  Vaping Use   Vaping Use: Never used  Substance and Sexual Activity   Alcohol use: Not Currently    Alcohol/week: 0.0 standard drinks    Comment: 01/24/2013  "drink or 2 once or /twice/year, if that"   Drug use: No   Sexual activity: Not Currently  Other Topics Concern   Not on file  Social History Narrative   No living will   Would want husband as POA--then sister, Neoma Laming   Would accept resuscitation attempts   Probably would not want tube feeds if cognitively unaware   Social Determinants of Health   Financial Resource Strain: Not on file  Food Insecurity: Not on file  Transportation Needs: Not on file  Physical Activity: Not on file  Stress: Not on file  Social Connections: Not on file   Past Surgical History:  Procedure Laterality Date   APPENDECTOMY  ~ 06/2007   [place]  ~ 06/2007   "same day after bladder lift" (01/24/2013)   BLADDER SUSPENSION  ~ 06/2007   BREAST BIOPSY Left    DIAGNOSTIC LAPAROSCOPY  1990's & ~ 2000   "I've had a couple; for endometrosis" (01/24/2013)   [place] N/A 01/24/2013   Procedure: LAPAROSCOPIC INCISIONAL HERNIA;  Surgeon: Harl Bowie, MD;  Location: [facility];  Service: General;  Laterality: N/A;   INSERTION OF MESH N/A 01/24/2013   Procedure: INSERTION OF MESH;  Surgeon:  Harl Bowie, MD;  Location: [facility];  Service: General;  Laterality: N/A;   LAPAROSCOPIC INCISIONAL / UMBILICAL / [place]  01/24/2013   IHR w/mesh/notes   NASAL SEPTUM SURGERY  1980's?   OOPHORECTOMY Right 2009   TONSILLECTOMY  1990's   VAGINAL HYSTERECTOMY  ` 06/2007   Past Surgical History:  Procedure Laterality Date   APPENDECTOMY  ~ 06/2007   BLADDER REPAIR  ~ 06/2007   "same day after bladder lift" (01/24/2013)   BLADDER SUSPENSION  ~ 06/2007   BREAST BIOPSY Left    DIAGNOSTIC LAPAROSCOPY  1990's & ~ 2000   "I've had a couple; for endometrosis" (01/24/2013)   [place] N/A 01/24/2013   Procedure: LAPAROSCOPIC INCISIONAL HERNIA;  Surgeon: Harl Bowie, MD;  Location: [facility];  Service: General;  Laterality: N/A;   INSERTION OF MESH N/A  01/24/2013   Procedure: INSERTION OF MESH;  Surgeon: Harl Bowie, MD;  Location: [facility];  Service: General;  Laterality: N/A;   LAPAROSCOPIC INCISIONAL / UMBILICAL / [place]  01/24/2013   IHR w/mesh/notes   NASAL SEPTUM SURGERY  1980's?   OOPHORECTOMY Right 2009   TONSILLECTOMY  1990's   VAGINAL HYSTERECTOMY  ` 06/2007   Past Medical History:  Diagnosis Date   Allergy    Anxiety    Arthritis    "just the norm" (01/24/2013)   ASCVD (arteriosclerotic cardiovascular disease)    Asthma    Celiac disease    Daily headache    "depends on the season" (01/24/2013)   Fibromyalgia    GERD (gastroesophageal reflux disease)    History of colonic polyps    Hyperlipidemia    Hypertension    Migraines    Obesity (BMI 30.0-34.9)    RSD (reflex sympathetic dystrophy)    Sleep apnea    NO MACHINE RECOMMENDED   TIA (transient ischemic attack) ~ 2009   BP (!) 142/91   Pulse 90   Temp 98.7 F (37.1 C) (Oral)   Ht 5' 7"  (1.702 m)   Wt 184 lb (83.5 kg)   SpO2 95%   BMI 28.82 kg/m   Opioid Risk Score:   Fall Risk Score:  `1  Depression screen PHQ 2/9  Depression screen [facility] 2/9 06/12/2021 05/12/2021 04/14/2021 03/13/2021 12/15/2020 11/17/2020 09/16/2020  Decreased Interest 1 1 1 1 1 1 1   Down, Depressed, Hopeless 1 1 1 1 1 1  -  PHQ - 2 Score 2 2 2 2 2 2 1   Altered sleeping - - - - - - -  Tired, decreased energy - - - - - - -  Change in appetite - - - - - - -  Feeling bad or failure about yourself  - - - - - - -  Trouble concentrating - - - - - - -  Moving slowly or fidgety/restless - - - - - - -  Suicidal thoughts - - - - - - -  PHQ-9 Score - - - - - - -  Difficult doing work/chores - - - - - - -  Some recent data might be hidden     Review of Systems  Musculoskeletal:        Left arm pain  Neurological:  Positive for weakness.  Psychiatric/Behavioral:  Positive for dysphoric mood.   All other systems reviewed and are negative.     Objective:   Physical  Exam Vitals and nursing note reviewed.  Constitutional:      Appearance: Normal appearance.  Cardiovascular:     Rate and Rhythm: Normal rate and regular rhythm.     Pulses: Normal pulses.     Heart sounds: Normal heart sounds.  Pulmonary:     Effort: Pulmonary effort is normal.     Breath sounds: Normal breath sounds.  Musculoskeletal:     Cervical back: Normal range of motion and neck supple.     Comments: Normal Muscle Bulk and Muscle Testing Reveals:  Upper Extremities: Full ROM and Muscle Strength 4/5  Thoracic Paraspinal Tenderness: T-7-T-9 Lumbar Paraspinal Tenderness: L-3-L-5 Lower Extremities: Full ROM and Muscle Strength 5/5 Bilateral Lower Extremities Flexion Produces Pain into her Bilateral Lower Extremities.  Arises from Table Slowly Antalgic Gait     Skin:    General: Skin is warm and dry.  Neurological:     Mental Status: She is alert and oriented to person, place, and time.  Psychiatric:        Mood and Affect: Mood normal.        Behavior: Behavior normal.         Assessment & Plan:  1.Complex Regional Pain Syndrome Type 1: Continue Topamax and Cymbalta. 07/28/2021. Refilled: Hydro-codone 7.5/325mg one tablet twice a day one in the morning and one at bedtime #60. Continue Tramadol 100 mg ER daily #30 and Tramadol 50 mg one tablet by mouth BID. One tablet in the afternoon and one tablet in the evening. We will continue the opioid monitoring program, this consists of regular clinic visits, examinations, urine drug screen, pill counts as well as use of [place] Controlled Substance Reporting system. A 12 month History has been reviewed on the [place] Controlled Substance Reporting System  On 07/28/2021. 2. Depression: Continue current medication regimen with Cymbalta.07/28/2021 3. Insomnia: Continue current medication regimen with  Trazodone one and half tablet at bedtime  07/28/2021 4. Muscle Spasms: Continue current medication regimen with  Tizanidine. 07/28/2021 5. Chronic Pain Syndrome: Continue Ibuprofen BID as needed. 07/28/2021     F/U in 1 month

## 2021-07-29 ENCOUNTER — Encounter: Payer: Self-pay | Admitting: Registered Nurse

## 2021-08-01 LAB — DRUG TOX MONITOR 1 W/CONF, ORAL FLD
Amphetamines: NEGATIVE ng/mL (ref <10)
Barbiturates: NEGATIVE ng/mL (ref <10)
Benzodiazepines: NEGATIVE ng/mL (ref <0.50)
Buprenorphine: NEGATIVE ng/mL (ref <0.10)
Cocaine: NEGATIVE ng/mL (ref <5.0)
Codeine: NEGATIVE ng/mL (ref <2.5)
Dihydrocodeine: 4.5 ng/mL — ABNORMAL HIGH (ref <2.5)
Fentanyl: NEGATIVE ng/mL (ref <0.10)
Heroin Metabolite: NEGATIVE ng/mL (ref <1.0)
Hydrocodone: 88.2 ng/mL — ABNORMAL HIGH (ref <2.5)
Hydromorphone: NEGATIVE ng/mL (ref <2.5)
MARIJUANA: NEGATIVE ng/mL (ref <2.5)
MDMA: NEGATIVE ng/mL (ref <10)
Meprobamate: NEGATIVE ng/mL (ref <2.5)
Methadone: NEGATIVE ng/mL (ref <5.0)
Morphine: NEGATIVE ng/mL (ref <2.5)
Nicotine Metabolite: NEGATIVE ng/mL (ref <5.0)
Norhydrocodone: 7.2 ng/mL — ABNORMAL HIGH (ref <2.5)
Noroxycodone: NEGATIVE ng/mL (ref <2.5)
Opiates: POSITIVE ng/mL — AB (ref <2.5)
Oxycodone: NEGATIVE ng/mL (ref <2.5)
Oxymorphone: NEGATIVE ng/mL (ref <2.5)
Phencyclidine: NEGATIVE ng/mL (ref <10)
Tapentadol: NEGATIVE ng/mL (ref <5.0)
Tramadol: >500 ng/mL — ABNORMAL HIGH (ref <5.0)
Tramadol: POSITIVE ng/mL — AB (ref <5.0)
Zolpidem: NEGATIVE ng/mL (ref <5.0)

## 2021-08-01 LAB — DRUG TOX ALC METAB W/CON, ORAL FLD: Alcohol Metabolite: NEGATIVE ng/mL (ref <25)

## 2021-08-05 ENCOUNTER — Other Ambulatory Visit: Payer: Self-pay | Admitting: Registered Nurse

## 2021-08-05 ENCOUNTER — Telehealth: Payer: Self-pay | Admitting: *Deleted

## 2021-08-05 NOTE — Telephone Encounter (Signed)
Oral swab drug screen was consistent for prescribed medications.  ?

## 2021-08-06 ENCOUNTER — Telehealth: Payer: Self-pay

## 2021-08-06 NOTE — Telephone Encounter (Signed)
Patient called stating worker's comp is cutting off her Cymbalta and she is afraid she will go through withdrawal. She stated she called her attorney, Margreta Journey, and she stated we can go through [place] comp portal to get it covered. She stated if we give her a call, she will send [place] the link to complete it. Her number is [phone]

## 2021-08-13 ENCOUNTER — Encounter: Payer: Self-pay | Admitting: *Deleted

## 2021-08-13 NOTE — Telephone Encounter (Signed)
Message sent to [name] informing her that we would not be able to pursue her cymbalta with her [place] comp if they are refusing to continue coverage. She will need to have her regular insurance cover the medication.

## 2021-08-18 ENCOUNTER — Encounter: Payer: Self-pay | Admitting: *Deleted

## 2021-08-18 ENCOUNTER — Other Ambulatory Visit: Payer: Self-pay

## 2021-08-18 ENCOUNTER — Telehealth: Payer: Self-pay | Admitting: Physical Medicine & Rehabilitation

## 2021-08-18 ENCOUNTER — Encounter: Payer: Medicare HMO | Attending: Internal Medicine | Admitting: *Deleted

## 2021-08-18 VITALS — BP 124/80 | Ht 67.0 in | Wt 183.8 lb

## 2021-08-18 DIAGNOSIS — E119 Type 2 diabetes mellitus without complications: Secondary | ICD-10-CM | POA: Diagnosis not present

## 2021-08-18 DIAGNOSIS — Z713 Dietary counseling and surveillance: Secondary | ICD-10-CM | POA: Insufficient documentation

## 2021-08-18 DIAGNOSIS — E1165 Type 2 diabetes mellitus with hyperglycemia: Secondary | ICD-10-CM

## 2021-08-18 NOTE — Patient Instructions (Signed)
Check blood sugars 1 x day before breakfast or 2 hrs after supper every day Bring blood sugar records to the next class  Call your doctor for a prescription for:  1. Meter strips (type) Accu-Chek Guide checking  1   times per day  2. Lancets (type) Accu-Chek Softclix checking  1   times per day  Exercise:  Walk as tolerated   Eat 3 meals day,   1-2  snacks a day Space meals 4-6 hours apart Don't skip meals - eat 1 protein and 1 carbohydrate serving Avoid sugar sweetened drinks (soda)  Make an eye doctor appointment  Return for classes on:

## 2021-08-18 NOTE — Telephone Encounter (Signed)
Patient has questions regarding her Cymbolta, will be available til noon today and then after 3.

## 2021-08-18 NOTE — Progress Notes (Signed)
Diabetes Self-Management Education  Visit Type: First/Initial  Appt. Start Time: 1315 Appt. End Time: 9983  08/18/2021  [name], identified by name and date of birth, is a 55 y.o. female with a diagnosis of Diabetes: Type 2.   ASSESSMENT  Blood pressure 124/80, height 5' 7"  (1.702 m), weight 183 lb 12.8 oz (83.4 kg). Body mass index is 28.79 kg/m.   Diabetes Self-Management Education - 08/18/21 1445       Visit Information   Visit Type First/Initial      Initial Visit   Diabetes Type Type 2    Are you currently following a meal plan? Yes    What type of meal plan do you follow? "less ice cream and soda"    Are you taking your medications as prescribed? Yes    Date Diagnosed 2 months      Health Coping   How would you rate your overall health? Fair      Psychosocial Assessment   Patient Belief/Attitude about Diabetes Other (comment)   "sad"   Self-care barriers Other (comment)   chronic pain - RSD   Self-management support Doctor's office;Family    Other persons present Family Member   Daughter   Patient Concerns Nutrition/Meal planning;Medication;Monitoring;Healthy Lifestyle;Problem Solving;Glycemic Control;Weight Control    Special Needs None    Preferred Learning Style Hands on    Learning Readiness Change in progress    How often do you need to have someone help you when you read instructions, pamphlets, or other written materials from your doctor or pharmacy? 1 - Never    What is the last grade level you completed in school? 12th      Pre-Education Assessment   Patient understands the diabetes disease and treatment process. Needs Instruction    Patient understands incorporating nutritional management into lifestyle. Needs Instruction    Patient undertands incorporating physical activity into lifestyle. Needs Instruction    Patient understands using medications safely. Needs Instruction    Patient understands monitoring blood glucose, interpreting  and using results Needs Instruction    Patient understands prevention, detection, and treatment of acute complications. Needs Instruction    Patient understands prevention, detection, and treatment of chronic complications. Needs Instruction    Patient understands how to develop strategies to address psychosocial issues. Needs Instruction    Patient understands how to develop strategies to promote health/change behavior. Needs Instruction      Complications   Last HgB A1C per patient/outside source 9.1 %   06/19/2021   How often do you check your blood sugar? 0 times/day (not testing)   Provided Accu-Chek Guide Me meter and instructed on use. BG upon return demonstration was 203 mg/dL at 2:05 pm - 2 hrs pp.   Have you had a dilated eye exam in the past 12 months? No    Have you had a dental exam in the past 12 months? No    Are you checking your feet? No      Dietary Intake   Breakfast skips    Snack (morning) reports 0-1 snacks/day - chips    Lunch breakfast hot pocket; bagel with cream cheese or butter    Dinner "I am a picky eater" - chicken, pork, occasional beef; bread, potatoes, peas, beans, corn, broccoli, cauliflower, green beans, salad wtih cuccumbers, olives    Beverage(s) soda - 1-2 servings per day; water, milk      Exercise   Exercise Type ADL's      Patient Education  Previous Diabetes Education No    Disease state  Definition of diabetes, type 1 and 2, and the diagnosis of diabetes;Factors that contribute to the development of diabetes    Nutrition management  Role of diet in the treatment of diabetes and the relationship between the three main macronutrients and blood glucose level;Food label reading, portion sizes and measuring food.;Reviewed blood glucose goals for pre and post meals and how to evaluate the patients' food intake on their blood glucose level.    Physical activity and exercise  Role of exercise on diabetes management, blood pressure control and cardiac  health.    Medications Reviewed patients medication for diabetes, action, purpose, timing of dose and side effects.    Monitoring Taught/evaluated SMBG meter.;Purpose and frequency of SMBG.;Taught/discussed recording of test results and interpretation of SMBG.;Identified appropriate SMBG and/or A1C goals.    Chronic complications Relationship between chronic complications and blood glucose control    Psychosocial adjustment Identified and addressed patients feelings and concerns about diabetes      Individualized Goals (developed by patient)   Reducing Risk Other (comment)   improve blood sugars, decrease medications, prevent diabetes complications, lose weight, lead a healthier lifestyle, become more fit     Outcomes   Expected Outcomes Demonstrated interest in learning. Expect positive outcomes    Future DMSE 2 wks        Individualized Plan for Diabetes Self-Management Training:   Learning Objective:  Patient will have a greater understanding of diabetes self-management. Patient education plan is to attend individual and/or group sessions per assessed needs and concerns.   Plan:   Patient Instructions  Check blood sugars 1 x day before breakfast or 2 hrs after supper every day Bring blood sugar records to the next class  Call your doctor for a prescription for:  1. Meter strips (type) Accu-Chek Guide checking  1   times per day  2. Lancets (type) Accu-Chek Softclix checking  1   times per day  Exercise:  Walk as tolerated   Eat 3 meals day,   1-2  snacks a day Space meals 4-6 hours apart Don't skip meals - eat 1 protein and 1 carbohydrate serving Avoid sugar sweetened drinks (soda)  Make an eye doctor appointment  Expected Outcomes:  Demonstrated interest in learning. Expect positive outcomes  Education material provided:  General Meal Planning Guidelines Simple Meal Plan Meter = Accu-Chek Guide Me  If problems or questions, patient to contact team via:   Johny Drilling, RN, [place] [phone]  Future DSME appointment: 2 wks August 31, 2021 for Diabetes Class 1

## 2021-08-19 NOTE — Telephone Encounter (Signed)
I spoke with Phelicia and informed her we would not approach her [place] comp for her cymbalta.  I advised her to use her regular insurance. She said that she is allergic to the generic form or cymbalta and they would not approve brand name. I told her if she is allergic to the generic formula we could prior auth with her REGULAR insurance for the name brand. She will talk to her pharmacist.

## 2021-08-19 NOTE — Telephone Encounter (Signed)
A Prior Auth has been submitted to [facility] via [place] for Cymbalta 30 mg #90/30d.

## 2021-08-24 ENCOUNTER — Telehealth: Payer: Self-pay

## 2021-08-24 NOTE — Telephone Encounter (Signed)
[facility] with [place] approved patient's Nexium. Approval 10/18/20-10/17/22

## 2021-08-24 NOTE — Telephone Encounter (Signed)
[facility] through [place] denied Lidocaine patches.

## 2021-08-31 ENCOUNTER — Ambulatory Visit: Payer: Medicare HMO

## 2021-09-04 ENCOUNTER — Encounter: Payer: Worker's Compensation | Attending: Registered Nurse | Admitting: Registered Nurse

## 2021-09-04 ENCOUNTER — Other Ambulatory Visit: Payer: Self-pay

## 2021-09-04 VITALS — BP 129/83 | HR 99 | Ht 67.0 in | Wt 182.0 lb

## 2021-09-04 DIAGNOSIS — G894 Chronic pain syndrome: Secondary | ICD-10-CM

## 2021-09-04 DIAGNOSIS — G4700 Insomnia, unspecified: Secondary | ICD-10-CM

## 2021-09-04 DIAGNOSIS — Z79891 Long term (current) use of opiate analgesic: Secondary | ICD-10-CM

## 2021-09-04 DIAGNOSIS — M62838 Other muscle spasm: Secondary | ICD-10-CM

## 2021-09-04 DIAGNOSIS — Z5181 Encounter for therapeutic drug level monitoring: Secondary | ICD-10-CM

## 2021-09-04 DIAGNOSIS — G90512 Complex regional pain syndrome I of left upper limb: Secondary | ICD-10-CM

## 2021-09-04 DIAGNOSIS — F3289 Other specified depressive episodes: Secondary | ICD-10-CM

## 2021-09-04 MED ORDER — TRAZODONE HCL 50 MG PO TABS: ORAL_TABLET | ORAL | 2 refills | Status: DC

## 2021-09-04 MED ORDER — TIZANIDINE HCL 4 MG PO TABS: ORAL_TABLET | ORAL | 3 refills | Status: DC

## 2021-09-04 MED ORDER — HYDROCODONE-ACETAMINOPHEN 7.5-325 MG PO TABS: ORAL_TABLET | ORAL | 0 refills | Status: DC

## 2021-09-04 NOTE — Progress Notes (Signed)
Subjective:    Patient ID: [name], female    DOB: [birth year], 55 y.o.   MRN: [id number]  HPI: [name] is a 55 y.o. female who returns for follow up appointment for chronic pain and medication refill. She states her pain is located in her left arm and left wrist. She  rates her pain 7. Her current exercise regime is walking.  [name] Morphine equivalent is 35.00 MME.   Last Oral Swab was Performed on 07/28/2021, it was consistent.    Pain Inventory Average Pain 7 Pain Right Now 7 My pain is constant, sharp, burning, stabbing, tingling, and aching  In the last 24 hours, has pain interfered with the following? General activity 5 Relation with others 5 Enjoyment of life 5 What TIME of day is your pain at its worst? morning , daytime, evening, and night Sleep (in general) Poor  Pain is worse with: walking, bending, standing, and some activites Pain improves with: rest, heat/ice, pacing activities, and medication Relief from Meds: 7  Family History  Problem Relation Age of Onset   Cancer Mother        lung   Hypertension Mother    Cancer Father        colon   Hypertension Sister    Cancer Sister    Cancer Brother    Hyperlipidemia Sister    Birth defects Maternal Grandmother        breast   Breast cancer Maternal Grandmother    Birth defects Paternal Grandmother        uterine, stomach, lung   Breast cancer Paternal Grandmother    Breast cancer Paternal Aunt    Social History   Socioeconomic History   Marital status: Married    Spouse name: Not on file   Number of children: 2   Years of education: Not on file   Highest education level: Not on file  Occupational History   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  Tobacco Use   Smoking status: Never   Smokeless tobacco: Never  Vaping Use   Vaping Use: Never used  Substance and Sexual Activity   Alcohol use: Not Currently    Alcohol/week: 0.0 standard drinks    Comment: 01/24/2013  "drink or 2 once or /twice/year, if that"   Drug use: No   Sexual activity: Not Currently  Other Topics Concern   Not on file  Social History Narrative   No living will   Would want husband as POA--then sister, Neoma Laming   Would accept resuscitation attempts   Probably would not want tube feeds if cognitively unaware   Social Determinants of Health   Financial Resource Strain: Not on file  Food Insecurity: Not on file  Transportation Needs: Not on file  Physical Activity: Not on file  Stress: Not on file  Social Connections: Not on file   Past Surgical History:  Procedure Laterality Date   APPENDECTOMY  ~ 06/2007   BLADDER REPAIR  ~ 06/2007   "same day after bladder lift" (01/24/2013)   BLADDER SUSPENSION  ~ 06/2007   BREAST BIOPSY Left    DIAGNOSTIC LAPAROSCOPY  1990's & ~ 2000   "I've had a couple; for endometrosis" (01/24/2013)   [place] N/A 01/24/2013   Procedure: LAPAROSCOPIC INCISIONAL HERNIA;  Surgeon: Harl Bowie, MD;  Location: [place];  Service: General;  Laterality: N/A;   INSERTION OF MESH N/A 01/24/2013   Procedure: INSERTION OF MESH;  Surgeon: Harl Bowie, MD;  Location: [place];  Service: General;  Laterality: N/A;   LAPAROSCOPIC INCISIONAL / UMBILICAL / [place]  01/24/2013   IHR w/mesh/notes   NASAL SEPTUM SURGERY  1980's?   OOPHORECTOMY Right 2009   TONSILLECTOMY  1990's   VAGINAL HYSTERECTOMY  ` 06/2007   Past Surgical History:  Procedure Laterality Date   APPENDECTOMY  ~ 06/2007   BLADDER REPAIR  ~ 06/2007   "same day after bladder lift" (01/24/2013)   BLADDER SUSPENSION  ~ 06/2007   BREAST BIOPSY Left    DIAGNOSTIC LAPAROSCOPY  1990's & ~ 2000   "I've had a couple; for endometrosis" (01/24/2013)   [place] N/A 01/24/2013   Procedure: LAPAROSCOPIC INCISIONAL HERNIA;  Surgeon: Harl Bowie, MD;  Location: [place];  Service: General;  Laterality: N/A;   INSERTION OF MESH N/A  01/24/2013   Procedure: INSERTION OF MESH;  Surgeon: Harl Bowie, MD;  Location: [place];  Service: General;  Laterality: N/A;   LAPAROSCOPIC INCISIONAL / UMBILICAL / [place]  01/24/2013   IHR w/mesh/notes   NASAL SEPTUM SURGERY  1980's?   OOPHORECTOMY Right 2009   TONSILLECTOMY  1990's   VAGINAL HYSTERECTOMY  ` 06/2007   Past Medical History:  Diagnosis Date   Allergy    Anxiety    Arthritis    "just the norm" (01/24/2013)   ASCVD (arteriosclerotic cardiovascular disease)    Asthma    Celiac disease    Daily headache    "depends on the season" (01/24/2013)   Diabetes mellitus without complication (HCC)    Fibromyalgia    GERD (gastroesophageal reflux disease)    History of colonic polyps    Hyperlipidemia    Hypertension    Migraines    Obesity (BMI 30.0-34.9)    RSD (reflex sympathetic dystrophy)    Sleep apnea    NO MACHINE RECOMMENDED   TIA (transient ischemic attack) ~ 2009   BP 129/83   Pulse 99   Ht 5' 7"  (1.702 m)   Wt 182 lb (82.6 kg)   SpO2 93%   BMI 28.51 kg/m   Opioid Risk Score:   Fall Risk Score:  `1  Depression screen PHQ 2/9  Depression screen [facility] 2/9 08/18/2021 06/12/2021 05/12/2021 04/14/2021 03/13/2021 12/15/2020 11/17/2020  Decreased Interest 1 1 1 1 1 1 1   Down, Depressed, Hopeless 1 1 1 1 1 1 1   PHQ - 2 Score 2 2 2 2 2 2 2   Altered sleeping 3 - - - - - -  Tired, decreased energy 3 - - - - - -  Change in appetite 1 - - - - - -  Feeling bad or failure about yourself  1 - - - - - -  Trouble concentrating 1 - - - - - -  Moving slowly or fidgety/restless 1 - - - - - -  Suicidal thoughts 0 - - - - - -  PHQ-9 Score 12 - - - - - -  Difficult doing work/chores Somewhat difficult - - - - - -  Some recent data might be hidden      Review of Systems  Musculoskeletal:        Left arm pain  Neurological:  Positive for weakness.  All other systems reviewed and are negative.     Objective:   Physical Exam Vitals and nursing note  reviewed.  Constitutional:  Appearance: Normal appearance.  Cardiovascular:     Rate and Rhythm: Normal rate and regular rhythm.     Pulses: Normal pulses.     Heart sounds: Normal heart sounds.  Pulmonary:     Effort: Pulmonary effort is normal.     Breath sounds: Normal breath sounds.  Musculoskeletal:     Cervical back: Normal range of motion and neck supple.     Comments: Normal Muscle Bulk and Muscle Testing Reveals:  Upper Extremities: Right: Full ROM and Muscle Strength 5/5 Left Lower Extremity: Decreased ROM 90 Degrees and Muscle Strength 3/5 Bilateral AC Joint Tenderness Thoracic Paraspinal Tenderness: T-7-T-9 Lumbar Paraspinal Tenderness: L-4-L-5 Lower Extremities: Full ROM and Muscle Strength 4/5 Arises from Table Slowly  Antalgic  Gait     Skin:    General: Skin is warm and dry.  Neurological:     Mental Status: She is alert and oriented to person, place, and time.  Psychiatric:        Mood and Affect: Mood normal.        Behavior: Behavior normal.         Assessment & Plan:  1.Complex Regional Pain Syndrome Type 1: Continue Topamax and Cymbalta. 09/08/2021. Refilled: Hydro-codone 7.5/325mg one tablet twice a day one in the morning and one at bedtime #60. Continue Tramadol 100 mg ER daily #30 and Tramadol 50 mg one tablet by mouth BID. One tablet in the afternoon and one tablet in the evening. We will continue the opioid monitoring program, this consists of regular clinic visits, examinations, urine drug screen, pill counts as well as use of [place] Controlled Substance Reporting system. A 12 month History has been reviewed on the [place] Controlled Substance Reporting System  On 09/08/2021. 2. Depression: Continue current medication regimen with Cymbalta.09/08/2021 3. Insomnia: Continue current medication regimen with  Trazodone one and half tablet at bedtime  09/08/2021 4. Muscle Spasms: Continue current medication regimen with Tizanidine.  09/08/2021 5. Chronic Pain Syndrome: Continue Ibuprofen BID as needed. 09/08/2021 6. ?Poor Blood Flow to Bilateral Feet/ Coolness with palpation: We discussed vascular appointment, she will speak with her PCP. Pedal Pulses +. Ms. Rande Brunt, verbalizes understanding.     F/U in 1 month

## 2021-09-07 ENCOUNTER — Ambulatory Visit: Payer: Medicare HMO

## 2021-09-08 ENCOUNTER — Telehealth: Payer: Self-pay | Admitting: Internal Medicine

## 2021-09-08 ENCOUNTER — Encounter: Payer: Self-pay | Admitting: Registered Nurse

## 2021-09-08 NOTE — Telephone Encounter (Signed)
Mrs. Sharmeka called in and stated that sh needed the accucheck guide and accheck softclix lancets. She stated she went to the dr and they gave her the meter and stated to have Dr. Silvio Pate write the script for the supplies.  [facility]

## 2021-09-09 MED ORDER — ACCU-CHEK GUIDE VI STRP: ORAL_STRIP | 5 refills | Status: DC

## 2021-09-09 MED ORDER — ACCU-CHEK SOFTCLIX LANCETS MISC: 5 refills | Status: DC

## 2021-09-09 NOTE — Telephone Encounter (Signed)
Rx sent electronically.  

## 2021-09-14 ENCOUNTER — Ambulatory Visit: Payer: Medicare HMO

## 2021-10-02 ENCOUNTER — Encounter: Payer: Self-pay | Admitting: *Deleted

## 2021-10-06 ENCOUNTER — Encounter: Payer: Self-pay | Admitting: Registered Nurse

## 2021-10-06 ENCOUNTER — Encounter: Payer: Worker's Compensation | Attending: Registered Nurse | Admitting: Registered Nurse

## 2021-10-06 ENCOUNTER — Other Ambulatory Visit: Payer: Self-pay

## 2021-10-06 VITALS — BP 134/84 | HR 83 | Temp 98.9°F | Ht 67.0 in | Wt 180.2 lb

## 2021-10-06 DIAGNOSIS — F3289 Other specified depressive episodes: Secondary | ICD-10-CM | POA: Diagnosis present

## 2021-10-06 DIAGNOSIS — Z5181 Encounter for therapeutic drug level monitoring: Secondary | ICD-10-CM | POA: Diagnosis present

## 2021-10-06 DIAGNOSIS — M62838 Other muscle spasm: Secondary | ICD-10-CM | POA: Diagnosis present

## 2021-10-06 DIAGNOSIS — G894 Chronic pain syndrome: Secondary | ICD-10-CM | POA: Diagnosis present

## 2021-10-06 DIAGNOSIS — G4700 Insomnia, unspecified: Secondary | ICD-10-CM | POA: Diagnosis present

## 2021-10-06 DIAGNOSIS — Z79891 Long term (current) use of opiate analgesic: Secondary | ICD-10-CM | POA: Diagnosis present

## 2021-10-06 DIAGNOSIS — G90512 Complex regional pain syndrome I of left upper limb: Secondary | ICD-10-CM

## 2021-10-06 MED ORDER — HYDROCODONE-ACETAMINOPHEN 7.5-325 MG PO TABS
ORAL_TABLET | ORAL | 0 refills | Status: DC
Start: 2021-10-06 — End: 2021-11-10

## 2021-10-06 MED ORDER — TRAMADOL HCL ER 100 MG PO TB24: 100.0000 mg | ORAL_TABLET | Freq: Every day | ORAL | 2 refills | Status: DC

## 2021-10-06 MED ORDER — TRAZODONE HCL 50 MG PO TABS: ORAL_TABLET | ORAL | 2 refills | Status: DC

## 2021-10-06 MED ORDER — TRAMADOL HCL 50 MG PO TABS
ORAL_TABLET | ORAL | 2 refills | Status: DC
Start: 2021-10-06 — End: 2021-11-10

## 2021-10-06 MED ORDER — CYMBALTA 30 MG PO CPEP: 90.0000 mg | ORAL_CAPSULE | Freq: Every day | ORAL | 2 refills | Status: DC

## 2021-10-06 NOTE — Progress Notes (Signed)
Subjective:    Patient ID: [name], female    DOB: 07-29-1966, 55 y.o.   MRN: [id number]  HPI: [name] is a 55 y.o. female who returns for follow up appointment for chronic pain and medication refill. She states her pain is located in her left arm and left wrist. She  rates her pain 7. Her current exercise regime is walking.   [name] Morphine equivalent is 35.00  MME.   Last Oral Swab was Performed on 07/28/2021, it was consistent.      Pain Inventory Average Pain 7 Pain Right Now 7 My pain is constant, sharp, burning, stabbing, tingling, and aching  In the last 24 hours, has pain interfered with the following? General activity 5 Relation with others 5 Enjoyment of life 5 What TIME of day is your pain at its worst? morning , daytime, evening, and night Sleep (in general) Poor  Pain is worse with: walking, bending, sitting, standing, and some activites Pain improves with: rest, pacing activities, medication, and TENS heat Relief from Meds: 6  Family History  Problem Relation Age of Onset   Cancer Mother        lung   Hypertension Mother    Cancer Father        colon   Hypertension Sister    Cancer Sister    Cancer Brother    Hyperlipidemia Sister    Birth defects Maternal Grandmother        breast   Breast cancer Maternal Grandmother    Birth defects Paternal Grandmother        uterine, stomach, lung   Breast cancer Paternal Grandmother    Breast cancer Paternal Aunt    Social History   Socioeconomic History   Marital status: Married    Spouse name: Not on file   Number of children: 2   Years of education: Not on file   Highest education level: Not on file  Occupational History   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  Tobacco Use   Smoking status: Never   Smokeless tobacco: Never  Vaping Use   Vaping Use: Never used  Substance and Sexual Activity   Alcohol use: Not Currently    Alcohol/week: 0.0 standard drinks     Comment: 01/24/2013 "drink or 2 once or /twice/year, if that"   Drug use: No   Sexual activity: Not Currently  Other Topics Concern   Not on file  Social History Narrative   No living will   Would want husband as POA--then sister, [name]   Would accept resuscitation attempts   Probably would not want tube feeds if cognitively unaware   Social Determinants of Health   Financial Resource Strain: Not on file  Food Insecurity: Not on file  Transportation Needs: Not on file  Physical Activity: Not on file  Stress: Not on file  Social Connections: Not on file   Past Surgical History:  Procedure Laterality Date   APPENDECTOMY  ~ 06/2007   BLADDER REPAIR  ~ 06/2007   "same day after bladder lift" (01/24/2013)   BLADDER SUSPENSION  ~ 06/2007   BREAST BIOPSY Left    DIAGNOSTIC LAPAROSCOPY  1990's & ~ 2000   "I've had a couple; for endometrosis" (01/24/2013)   HERNIA REPAIR     INCISIONAL HERNIA REPAIR N/A 01/24/2013   Procedure: LAPAROSCOPIC INCISIONAL HERNIA;  Surgeon: Shelly Rubenstein, MD;  [facility];  Service: General;  Laterality: N/A;   INSERTION OF MESH N/A 01/24/2013  Procedure: INSERTION OF MESH;  Surgeon: Shelly Rubenstein, MD;  [facility];  Service: General;  Laterality: N/A;   LAPAROSCOPIC INCISIONAL / UMBILICAL / VENTRAL HERNIA REPAIR  01/24/2013   IHR w/mesh/notes   NASAL SEPTUM SURGERY  1980's?   OOPHORECTOMY Right 2009   TONSILLECTOMY  1990's   VAGINAL HYSTERECTOMY  ` 06/2007   Past Surgical History:  Procedure Laterality Date   APPENDECTOMY  ~ 06/2007   BLADDER REPAIR  ~ 06/2007   "same day after bladder lift" (01/24/2013)   BLADDER SUSPENSION  ~ 06/2007   BREAST BIOPSY Left    DIAGNOSTIC LAPAROSCOPY  1990's & ~ 2000   "I've had a couple; for endometrosis" (01/24/2013)   HERNIA REPAIR     INCISIONAL HERNIA REPAIR N/A 01/24/2013   Procedure: LAPAROSCOPIC INCISIONAL HERNIA;  Surgeon: Shelly Rubenstein, MD;  [facility];  Service: General;  Laterality: N/A;    INSERTION OF MESH N/A 01/24/2013   Procedure: INSERTION OF MESH;  Surgeon: Shelly Rubenstein, MD;  [facility];  Service: General;  Laterality: N/A;   LAPAROSCOPIC INCISIONAL / UMBILICAL / VENTRAL HERNIA REPAIR  01/24/2013   IHR w/mesh/notes   NASAL SEPTUM SURGERY  1980's?   OOPHORECTOMY Right 2009   TONSILLECTOMY  1990's   VAGINAL HYSTERECTOMY  ` 06/2007   Past Medical History:  Diagnosis Date   Allergy    Anxiety    Arthritis    "just the norm" (01/24/2013)   ASCVD (arteriosclerotic cardiovascular disease)    Asthma    Celiac disease    Daily headache    "depends on the season" (01/24/2013)   Diabetes mellitus without complication (HCC)    Fibromyalgia    GERD (gastroesophageal reflux disease)    History of colonic polyps    Hyperlipidemia    Hypertension    Migraines    Obesity (BMI 30.0-34.9)    RSD (reflex sympathetic dystrophy)    Sleep apnea    NO MACHINE RECOMMENDED   TIA (transient ischemic attack) ~ 2009   There were no vitals taken for this visit.  Opioid Risk Score:   Fall Risk Score:  `1  Depression screen PHQ 2/9  Depression screen [facility] 2/9 08/18/2021 06/12/2021 05/12/2021 04/14/2021 03/13/2021 12/15/2020 11/17/2020  Decreased Interest 1 1 1 1 1 1 1   Down, Depressed, Hopeless 1 1 1 1 1 1 1   PHQ - 2 Score 2 2 2 2 2 2 2   Altered sleeping 3 - - - - - -  Tired, decreased energy 3 - - - - - -  Change in appetite 1 - - - - - -  Feeling bad or failure about yourself  1 - - - - - -  Trouble concentrating 1 - - - - - -  Moving slowly or fidgety/restless 1 - - - - - -  Suicidal thoughts 0 - - - - - -  PHQ-9 Score 12 - - - - - -  Difficult doing work/chores Somewhat difficult - - - - - -  Some recent data might be hidden    Review of Systems  Musculoskeletal:        Pain in left wrist and hand  All other systems reviewed and are negative.     Objective:   Physical Exam Vitals and nursing note reviewed.  Constitutional:      Appearance: Normal appearance.   Neck:     Comments: Cervical Paraspinal Tenderness: C-5- C-6  Cardiovascular:  Rate and Rhythm: Normal rate and regular rhythm.     Pulses: Normal pulses.     Heart sounds: Normal heart sounds.  Pulmonary:     Effort: Pulmonary effort is normal.     Breath sounds: Normal breath sounds.  Musculoskeletal:     Cervical back: Normal range of motion and neck supple.     Comments: Normal Muscle Bulk and Muscle Testing Reveals:  Upper Extremities: Right: Full ROM and Muscle Strength 5/5 Left Upper Extremity: Decreased ROM 90 Degrees and Muscle Strength 4/5 Bilateral AC Joint Tenderness  Lower Extremities: Full ROM and Muscle Strength 5/5  Bilateral Lower Extremities Flexion Produces Pain into her Bilateral Lower Extremities Antalgic  Gait     Skin:    General: Skin is warm and dry.  Neurological:     Mental Status: She is alert and oriented to person, place, and time.  Psychiatric:        Mood and Affect: Mood normal.        Behavior: Behavior normal.         Assessment & Plan:  1.Complex Regional Pain Syndrome Type 1: Continue Topamax and Cymbalta. 10/06/2021. Refilled: Hydro-codone 7.5/325mg  one tablet twice a day one in the morning and one at bedtime #60. Continue Tramadol 100 mg ER daily #30 and Tramadol 50 mg one tablet by mouth BID. One tablet in the afternoon and one tablet in the evening. We will continue the opioid monitoring program, this consists of regular clinic visits, examinations, urine drug screen, pill counts as well as use of [place] Controlled Substance Reporting system. A 12 month History has been reviewed on the [place] Controlled Substance Reporting System  On 10/06/2021. 2. Depression: Continue current medication regimen with Cymbalta.10/06/2021 3. Insomnia: Continue current medication regimen with  Trazodone one and half tablet at bedtime  10/06/2021 4. Muscle Spasms: Continue current medication regimen with Tizanidine. 10/06/2021 5. Chronic  Pain Syndrome: Continue Ibuprofen BID as needed. 10/06/2021   F/U in 1 month

## 2021-10-14 ENCOUNTER — Telehealth: Payer: Self-pay

## 2021-10-14 NOTE — Telephone Encounter (Signed)
Attempted to do Trazodone PA in CoverMyMeds and it stated that a PA is not needed. Letter scanned in chart.

## 2021-10-29 ENCOUNTER — Other Ambulatory Visit: Payer: Self-pay

## 2021-10-29 ENCOUNTER — Ambulatory Visit (INDEPENDENT_AMBULATORY_CARE_PROVIDER_SITE_OTHER): Payer: Medicare HMO | Admitting: Internal Medicine

## 2021-10-29 ENCOUNTER — Encounter: Payer: Self-pay | Admitting: Internal Medicine

## 2021-10-29 VITALS — BP 130/84 | HR 105 | Temp 97.5°F | Ht 67.0 in | Wt 177.0 lb

## 2021-10-29 DIAGNOSIS — J029 Acute pharyngitis, unspecified: Secondary | ICD-10-CM

## 2021-10-29 DIAGNOSIS — I10 Essential (primary) hypertension: Secondary | ICD-10-CM | POA: Diagnosis not present

## 2021-10-29 DIAGNOSIS — E1159 Type 2 diabetes mellitus with other circulatory complications: Secondary | ICD-10-CM | POA: Diagnosis not present

## 2021-10-29 DIAGNOSIS — J069 Acute upper respiratory infection, unspecified: Secondary | ICD-10-CM | POA: Diagnosis not present

## 2021-10-29 LAB — POCT GLYCOSYLATED HEMOGLOBIN (HGB A1C): Hemoglobin A1C: 7.3 % — AB (ref 4.0–5.6)

## 2021-10-29 LAB — POC COVID19 BINAXNOW: SARS Coronavirus 2 Ag: NEGATIVE

## 2021-10-29 LAB — HM DIABETES FOOT EXAM

## 2021-10-29 MED ORDER — ACCU-CHEK SOFTCLIX LANCETS MISC: 5 refills | Status: DC

## 2021-10-29 MED ORDER — ACCU-CHEK GUIDE VI STRP: ORAL_STRIP | 5 refills | Status: DC

## 2021-10-29 NOTE — Assessment & Plan Note (Addendum)
Hopefully control is good now On metformin ER 1000mg  daily Chronic HTN  A1c down to 7.3%----discussed that this is acceptable No new meds needed She will call if sugar control declines

## 2021-10-29 NOTE — Assessment & Plan Note (Signed)
Mild symptoms No exacerbation of asthma If worsens next week, would consider empiric treatment of sinusitis with an antibiotic (?doxy)

## 2021-10-29 NOTE — Assessment & Plan Note (Signed)
BP Readings from Last 3 Encounters:  10/29/21 130/84  10/06/21 134/84  09/04/21 129/83   Good control on olmesartan 40mg 

## 2021-10-29 NOTE — Progress Notes (Signed)
Subjective:    Patient ID: [name], female    DOB: December 15, 1965, 56 y.o.   MRN: [id number]  HPI Here for follow up of diabetes  Did go for the diabetic training Has machine but not checking sugars Has cut out ice cream--mostly stopped sodas  Did have some stomach upset with metformin at first Now doing okay---2 in the morning Also gets some reflux at night and lump in throat Continues on the omeprazole  No chest pain  No SOB  Also with some cold symptoms this week Now mostly settled in chest No fever Breathing is okay  Current Outpatient Medications on File Prior to Visit  Medication Sig Dispense Refill   albuterol (VENTOLIN HFA) 108 (90 Base) MCG/ACT inhaler Inhale 2 puffs into the lungs every 6 (six) hours as needed for wheezing or shortness of breath. 18 g 1   CYMBALTA 30 MG capsule Take 3 capsules (90 mg total) by mouth daily. 270 capsule 2   HYDROcodone-acetaminophen (NORCO) 7.5-325 MG tablet TAKE 1 TABLET Twice a Day. One in the Morning and one at Bedtime. Do Not Fill Before 10/10/2021 60 tablet 0   ibuprofen (ADVIL) 600 MG tablet TAKE 1 TABLET (600 MG TOTAL) BY MOUTH EVERY 8 (EIGHT) HOURS AS NEEDED FOR MILD PAIN. 90 tablet 5   loratadine (CLARITIN) 10 MG tablet Take 10 mg by mouth daily.     metFORMIN (GLUCOPHAGE-XR) 500 MG 24 hr tablet Take 2 tablets (1,000 mg total) by mouth daily with breakfast. 60 tablet 3   metoCLOPramide (REGLAN) 10 MG tablet Take 10 mg by mouth 2 (two) times daily.     montelukast (SINGULAIR) 10 MG tablet TAKE 1 TABLET BY MOUTH EVERYDAY AT BEDTIME 90 tablet 3   olmesartan (BENICAR) 40 MG tablet TAKE 1 TABLET BY MOUTH EVERY DAY 90 tablet 3   omeprazole (PRILOSEC) 40 MG capsule Take 40 mg by mouth 2 (two) times daily.     SYMBICORT 160-4.5 MCG/ACT inhaler Inhale 2 puffs into the lungs daily. 1 each 11   tiZANidine (ZANAFLEX) 4 MG tablet TAKE 1 TABLET (4 MG TOTAL) BY MOUTH 4 (FOUR) TIMES DAILY. 120 tablet 3   topiramate (TOPAMAX) 100 MG  tablet TAKE 1 TABLET BY MOUTH EVERY MORNING AND TAKE 2 TABLETS BY MOUTH EVERY EVENING 90 tablet 5   traMADol (ULTRAM) 50 MG tablet TAKE 1 TABLET BY MOUTH TWICE A DAY. DIRECTION CHANGE. 60 tablet 2   traMADol (ULTRAM-ER) 100 MG 24 hr tablet Take 1 tablet (100 mg total) by mouth daily. 30 tablet 2   traZODone (DESYREL) 50 MG tablet TAKE 1.5 TABLETS BY MOUTH AT BEDTIME 135 tablet 2   [DISCONTINUED] losartan (COZAAR) 100 MG tablet TAKE 1 TABLET BY MOUTH EVERY DAY 90 tablet 0   No current facility-administered medications on file prior to visit.    Allergies  Allergen Reactions   Meperidine Anaphylaxis, Itching and Swelling    REACTION: swelling   Cymbalta [Duloxetine Hcl] Hives    Generic makes her feel like she is crawling out of her skin   Diazepam Hives    REACTION: swelling   Other     Axe Cologne   Shellfish Allergy Hives    Past Medical History:  Diagnosis Date   Allergy    Anxiety    Arthritis    "just the norm" (01/24/2013)   ASCVD (arteriosclerotic cardiovascular disease)    Asthma    Celiac disease    Daily headache    "depends on the season" (  01/24/2013)   Diabetes mellitus without complication (HCC)    Fibromyalgia    GERD (gastroesophageal reflux disease)    History of colonic polyps    Hyperlipidemia    Hypertension    Migraines    Obesity (BMI 30.0-34.9)    RSD (reflex sympathetic dystrophy)    Sleep apnea    NO MACHINE RECOMMENDED   TIA (transient ischemic attack) ~ 2009    Past Surgical History:  Procedure Laterality Date   APPENDECTOMY  ~ 06/2007   BLADDER REPAIR  ~ 06/2007   "same day after bladder lift" (01/24/2013)   BLADDER SUSPENSION  ~ 06/2007   BREAST BIOPSY Left    DIAGNOSTIC LAPAROSCOPY  1990's & ~ 2000   "I've had a couple; for endometrosis" (01/24/2013)   HERNIA REPAIR     INCISIONAL HERNIA REPAIR N/A 01/24/2013   Procedure: LAPAROSCOPIC INCISIONAL HERNIA;  Surgeon: Shelly Rubensteinouglas A Blackman, MD;  Location[facility];  Service: General;  Laterality: N/A;    INSERTION OF MESH N/A 01/24/2013   Procedure: INSERTION OF MESH;  Surgeon: Shelly Rubensteinouglas A Blackman, MD;  [facility];  Service: General;  Laterality: N/A;   LAPAROSCOPIC INCISIONAL / UMBILICAL / VENTRAL HERNIA REPAIR  01/24/2013   IHR w/mesh/notes   NASAL SEPTUM SURGERY  1980's?   OOPHORECTOMY Right 2009   TONSILLECTOMY  1990's   VAGINAL HYSTERECTOMY  ` 06/2007    Family History  Problem Relation Age of Onset   Cancer Mother        lung   Hypertension Mother    Cancer Father        colon   Hypertension Sister    Cancer Sister    Cancer Brother    Hyperlipidemia Sister    Birth defects Maternal Grandmother        breast   Breast cancer Maternal Grandmother    Birth defects Paternal Grandmother        uterine, stomach, lung   Breast cancer Paternal Grandmother    Breast cancer Paternal Aunt     Social History   Socioeconomic History   Marital status: Married    Spouse name: Not on file   Number of children: 2   Years of education: Not on file   Highest education level: Not on file  Occupational History   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  Tobacco Use   Smoking status: Never   Smokeless tobacco: Never  Vaping Use   Vaping Use: Never used  Substance and Sexual Activity   Alcohol use: Not Currently    Alcohol/week: 0.0 standard drinks    Comment: 01/24/2013 "drink or 2 once or /twice/year, if that"   Drug use: No   Sexual activity: Not Currently  Other Topics Concern   Not on file  Social History Narrative   No living will   Would want husband as POA--then sister, Gavin PoundDeborah   Would accept resuscitation attempts   Probably would not want tube feeds if cognitively unaware   Social Determinants of Health   Financial Resource Strain: Not on file  Food Insecurity: Not on file  Transportation Needs: Not on file  Physical Activity: Not on file  Stress: Not on file  Social Connections: Not on file  Intimate Partner Violence: Not on file   Review of Systems Sleep is never  great--no change Chronic pain is stable     Objective:   Physical Exam Constitutional:      Appearance: Normal appearance.  Cardiovascular:     Rate  and Rhythm: Normal rate and regular rhythm.     Pulses: Normal pulses.     Heart sounds: No murmur heard.   No gallop.  Pulmonary:     Effort: Pulmonary effort is normal.     Breath sounds: No wheezing or rales.     Comments: Slightly decreased breath sounds but clear Musculoskeletal:     Cervical back: Neck supple.     Right lower leg: No edema.     Left lower leg: No edema.  Lymphadenopathy:     Cervical: No cervical adenopathy.  Skin:    Comments: No foot lesions  Neurological:     Mental Status: She is alert.     Comments: Abnormal sensation in feet (chronic)  Psychiatric:        Mood and Affect: Mood normal.        Behavior: Behavior normal.           Assessment & Plan:

## 2021-11-03 ENCOUNTER — Telehealth: Payer: Self-pay | Admitting: Internal Medicine

## 2021-11-03 MED ORDER — DOXYCYCLINE HYCLATE 100 MG PO TABS: 100.0000 mg | ORAL_TABLET | Freq: Two times a day (BID) | ORAL | 0 refills | Status: DC

## 2021-11-03 NOTE — Telephone Encounter (Signed)
Pt was seen on 1/12 she said that she was told to call back if she wasn't feeling better. She is still having a cough and sore throat

## 2021-11-03 NOTE — Telephone Encounter (Signed)
Spoke to pt. She will have to use a Good RX card.

## 2021-11-10 ENCOUNTER — Encounter: Payer: Self-pay | Admitting: Registered Nurse

## 2021-11-10 ENCOUNTER — Other Ambulatory Visit: Payer: Self-pay

## 2021-11-10 ENCOUNTER — Encounter: Payer: Worker's Compensation | Attending: Registered Nurse | Admitting: Registered Nurse

## 2021-11-10 VITALS — BP 127/80 | HR 84 | Temp 98.7°F | Ht 67.0 in | Wt 177.0 lb

## 2021-11-10 DIAGNOSIS — G4700 Insomnia, unspecified: Secondary | ICD-10-CM | POA: Diagnosis present

## 2021-11-10 DIAGNOSIS — Z5181 Encounter for therapeutic drug level monitoring: Secondary | ICD-10-CM | POA: Insufficient documentation

## 2021-11-10 DIAGNOSIS — G90512 Complex regional pain syndrome I of left upper limb: Secondary | ICD-10-CM | POA: Diagnosis present

## 2021-11-10 DIAGNOSIS — G894 Chronic pain syndrome: Secondary | ICD-10-CM | POA: Diagnosis present

## 2021-11-10 DIAGNOSIS — M62838 Other muscle spasm: Secondary | ICD-10-CM | POA: Insufficient documentation

## 2021-11-10 DIAGNOSIS — Z79891 Long term (current) use of opiate analgesic: Secondary | ICD-10-CM | POA: Diagnosis present

## 2021-11-10 DIAGNOSIS — F3289 Other specified depressive episodes: Secondary | ICD-10-CM | POA: Diagnosis present

## 2021-11-10 MED ORDER — HYDROCODONE-ACETAMINOPHEN 7.5-325 MG PO TABS: ORAL_TABLET | ORAL | 0 refills | Status: DC

## 2021-11-10 MED ORDER — TRAMADOL HCL ER 100 MG PO TB24: 100.0000 mg | ORAL_TABLET | Freq: Every day | ORAL | 2 refills | Status: DC

## 2021-11-10 MED ORDER — TRAMADOL HCL 50 MG PO TABS: ORAL_TABLET | ORAL | 2 refills | Status: DC

## 2021-11-10 NOTE — Progress Notes (Signed)
Subjective:    Patient ID: [name], female    DOB: [birth year], 56 y.o.   MRN: [id number]  HPI: [name] is a 56 y.o. female who returns for follow up appointment for chronic pain and medication refill. She states her pain is located in her left arm and left wrist. She rates her pain 6. Her current exercise regime is walking short distances.  [name] Morphine equivalent is 35.00 MME.   Oral Swab was Performed today.    Pain Inventory Average Pain 7 Pain Right Now 6 My pain is constant, sharp, burning, stabbing, tingling, and aching  In the last 24 hours, has pain interfered with the following? General activity 4 Relation with others 4 Enjoyment of life 4 What TIME of day is your pain at its worst? morning , daytime, evening, and night Sleep (in general) Poor  Pain is worse with: walking, bending, sitting, standing, and some activites Pain improves with: rest, pacing activities, and medication Relief from Meds:  n/a  Family History  Problem Relation Age of Onset   Cancer Mother        lung   Hypertension Mother    Cancer Father        colon   Hypertension Sister    Cancer Sister    Cancer Brother    Hyperlipidemia Sister    Birth defects Maternal Grandmother        breast   Breast cancer Maternal Grandmother    Birth defects Paternal Grandmother        uterine, stomach, lung   Breast cancer Paternal Grandmother    Breast cancer Paternal Aunt    Social History   Socioeconomic History   Marital status: Married    Spouse name: Not on file   Number of children: 2   Years of education: Not on file   Highest education level: Not on file  Occupational History   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  Tobacco Use   Smoking status: Never   Smokeless tobacco: Never  Vaping Use   Vaping Use: Never used  Substance and Sexual Activity   Alcohol use: Not Currently    Alcohol/week: 0.0 standard drinks    Comment: 01/24/2013 "drink or 2 once or  /twice/year, if that"   Drug use: No   Sexual activity: Not Currently  Other Topics Concern   Not on file  Social History Narrative   No living will   Would want husband as POA--then sister, [name]   Would accept resuscitation attempts   Probably would not want tube feeds if cognitively unaware   Social Determinants of Health   Financial Resource Strain: Not on file  Food Insecurity: Not on file  Transportation Needs: Not on file  Physical Activity: Not on file  Stress: Not on file  Social Connections: Not on file   Past Surgical History:  Procedure Laterality Date   APPENDECTOMY  ~ 06/2007   BLADDER REPAIR  ~ 06/2007   "same day after bladder lift" (01/24/2013)   BLADDER SUSPENSION  ~ 06/2007   BREAST BIOPSY Left    DIAGNOSTIC LAPAROSCOPY  1990's & ~ 2000   "I've had a couple; for endometrosis" (01/24/2013)   [place] N/A 01/24/2013   Procedure: LAPAROSCOPIC INCISIONAL HERNIA;  Surgeon: Harl Bowie, MD;  Location: [facility];  Service: General;  Laterality: N/A;   INSERTION OF MESH N/A 01/24/2013   Procedure: INSERTION OF MESH;  Surgeon: Marco Collie  Ninfa Linden, MD;  Location: [facility];  Service: General;  Laterality: N/A;   LAPAROSCOPIC INCISIONAL / UMBILICAL / [place]  01/24/2013   IHR w/mesh/notes   NASAL SEPTUM SURGERY  1980's?   OOPHORECTOMY Right 2009   TONSILLECTOMY  1990's   VAGINAL HYSTERECTOMY  ` 06/2007   Past Surgical History:  Procedure Laterality Date   APPENDECTOMY  ~ 06/2007   BLADDER REPAIR  ~ 06/2007   "same day after bladder lift" (01/24/2013)   BLADDER SUSPENSION  ~ 06/2007   BREAST BIOPSY Left    DIAGNOSTIC LAPAROSCOPY  1990's & ~ 2000   "I've had a couple; for endometrosis" (01/24/2013)   [place] N/A 01/24/2013   Procedure: LAPAROSCOPIC INCISIONAL HERNIA;  Surgeon: Harl Bowie, MD;  Location: [facility];  Service: General;  Laterality: N/A;   INSERTION OF MESH N/A 01/24/2013    Procedure: INSERTION OF MESH;  Surgeon: Harl Bowie, MD;  Location: [place];  Service: General;  Laterality: N/A;   LAPAROSCOPIC INCISIONAL / UMBILICAL / [place]  01/24/2013   IHR w/mesh/notes   NASAL SEPTUM SURGERY  1980's?   OOPHORECTOMY Right 2009   TONSILLECTOMY  1990's   VAGINAL HYSTERECTOMY  ` 06/2007   Past Medical History:  Diagnosis Date   Allergy    Anxiety    Arthritis    "just the norm" (01/24/2013)   ASCVD (arteriosclerotic cardiovascular disease)    Asthma    Celiac disease    Daily headache    "depends on the season" (01/24/2013)   Diabetes mellitus without complication (HCC)    Fibromyalgia    GERD (gastroesophageal reflux disease)    History of colonic polyps    Hyperlipidemia    Hypertension    Migraines    Obesity (BMI 30.0-34.9)    RSD (reflex sympathetic dystrophy)    Sleep apnea    NO MACHINE RECOMMENDED   TIA (transient ischemic attack) ~ 2009   BP 127/80 (BP Location: Right Arm)    Pulse 84    Temp 98.7 F (37.1 C) (Oral)    Ht 5\' 7"  (1.702 m)    Wt 177 lb (80.3 kg)    SpO2 96%    BMI 27.72 kg/m   Opioid Risk Score:   Fall Risk Score:  `1  Depression screen PHQ 2/9  Depression screen [facility] 2/9 10/06/2021 08/18/2021 06/12/2021 05/12/2021 04/14/2021 03/13/2021 12/15/2020  Decreased Interest 1 1 1 1 1 1 1   Down, Depressed, Hopeless 1 1 1 1 1 1 1   PHQ - 2 Score 2 2 2 2 2 2 2   Altered sleeping - 3 - - - - -  Tired, decreased energy - 3 - - - - -  Change in appetite - 1 - - - - -  Feeling bad or failure about yourself  - 1 - - - - -  Trouble concentrating - 1 - - - - -  Moving slowly or fidgety/restless - 1 - - - - -  Suicidal thoughts - 0 - - - - -  PHQ-9 Score - 12 - - - - -  Difficult doing work/chores - Somewhat difficult - - - - -  Some recent data might be hidden       Review of Systems  Constitutional: Negative.   HENT: Negative.    Eyes: Negative.   Respiratory: Negative.    Cardiovascular: Negative.   Gastrointestinal:  Negative.   Endocrine: Negative.  Genitourinary: Negative.   Musculoskeletal:        Pain in left arm  Skin: Negative.   Allergic/Immunologic: Negative.   Neurological: Negative.   Hematological: Negative.   Psychiatric/Behavioral: Negative.        Objective:   Physical Exam Vitals and nursing note reviewed.  Constitutional:      Appearance: Normal appearance.  Cardiovascular:     Rate and Rhythm: Normal rate and regular rhythm.     Pulses: Normal pulses.     Heart sounds: Normal heart sounds.  Pulmonary:     Effort: Pulmonary effort is normal.     Breath sounds: Normal breath sounds.  Musculoskeletal:     Cervical back: Normal range of motion and neck supple.     Comments: Normal Muscle Bulk and Muscle Testing Reveals:  Upper Extremities:Right: Full  ROM and Muscle Strength 4/5 Left Upper  Extremity: Decreased ROM  45 Degrees and Muscle Strength 2/5  Thoracic Paraspinal Tenderness: T-7-T-9  Lower Extremities: Decreased ROM and Muscle Strength 4/5 Bilateral Lower Extremities Flexion Produces Pain into her Lower Extremities Arises from Table Slowly Antalgic  Gait     Skin:    General: Skin is warm and dry.  Neurological:     Mental Status: She is alert and oriented to person, place, and time.  Psychiatric:        Mood and Affect: Mood normal.        Behavior: Behavior normal.         Assessment & Plan:  1.Complex Regional Pain Syndrome Type 1: Continue Topamax and Cymbalta. 11/10/2021. Refilled: Hydro-codone 7.5/325mg  one tablet twice a day one in the morning and one at bedtime #60. Continue Tramadol 100 mg ER daily #30 and Tramadol 50 mg one tablet by mouth BID. One tablet in the afternoon and one tablet in the evening. We will continue the opioid monitoring program, this consists of regular clinic visits, examinations, urine drug screen, pill counts as well as use of [place] Controlled Substance Reporting system. A 12 month History has been reviewed on the  [place] Controlled Substance Reporting System  On 11/10/2021. 2. Depression: Continue current medication regimen with Cymbalta.11/10/2021 3. Insomnia: Continue current medication regimen with  Trazodone one and half tablet at bedtime  11/10/2021 4. Muscle Spasms: Continue current medication regimen with Tizanidine. 11/10/2021 5. Chronic Pain Syndrome: Continue Ibuprofen BID as needed. 10/06/2021   F/U in 1 month

## 2021-11-11 ENCOUNTER — Encounter: Payer: Self-pay | Admitting: Family Medicine

## 2021-11-11 ENCOUNTER — Telehealth (INDEPENDENT_AMBULATORY_CARE_PROVIDER_SITE_OTHER): Payer: Medicare HMO | Admitting: Family Medicine

## 2021-11-11 ENCOUNTER — Telehealth: Payer: Self-pay | Admitting: *Deleted

## 2021-11-11 DIAGNOSIS — T7840XA Allergy, unspecified, initial encounter: Secondary | ICD-10-CM

## 2021-11-11 MED ORDER — PREDNISONE 10 MG PO TABS: ORAL_TABLET | ORAL | 0 refills | Status: DC

## 2021-11-11 NOTE — Progress Notes (Signed)
Virtual Visit via Video Note  I connected with [name] on 11/11/21 at  3:30 PM EST by a video enabled telemedicine application and verified that I am speaking with the correct person using two identifiers.  Location: Patient: home Provider: office    I discussed the limitations of evaluation and management by telemedicine and the availability of in person appointments. The patient expressed understanding and agreed to proceed.  Parties involved in encounter  Patient: [name]   Provider:  Loura Pardon MD   History of Present Illness: 56 yo pt of Dr Silvio Pate presents with a medication reaction   She was treated with doxycycline for cough and ST  and sinusitis by pcp  In the height of reaction had a heavy chest  Did wheeze  This was last night  Rash and itchy all over   Still some after effects  Still feels like she has a lump in her throat  Able to swallow and speak fine  No sob  No wheezing Rash is receeding - benadryl helped  No hives- just redness /pink in color -not raised  Still itching but not as bad   No tongue swelling  Lips did not swell Eyes itchy/ but not swelling   No nausea or diarrhea or abd pain or diarrhea   Take benadryl  Does not make her sleepy  Still takes claritin    the sinus infection is better  Some mucous from nose -cloudy  No more sinus pain or pressure Throat is just a little sore   Has taken prednisone in the past for asthma  For asthma in the past   Has symbicort  Has not needed albuterol   Patient Active Problem List   Diagnosis Date Noted   Allergic reaction caused by a drug 11/11/2021   Viral URI 10/29/2021   Type 2 diabetes mellitus with circulatory disorder, without long-term current use of insulin ([place]) 07/22/2021   MCI (mild cognitive impairment) 06/19/2021   Prediabetes 05/22/2019   Chronic narcotic dependence ([place]) 05/22/2019   Celiac disease    Advance directive discussed with patient  05/02/2017   RSD upper limb 01/27/2015   Reflex sympathetic dystrophy of lower extremity 09/26/2014   Swelling of limb 04/18/2014   Peripheral autonomic neuropathy 08/03/2013   Urinary incontinence 04/19/2013   Routine general medical examination at a health care facility 08/02/2011   HYPERLIPIDEMIA 12/21/2007   MIGRAINE VARIANT 12/21/2007   Essential hypertension, benign 06/30/2007   ALLERGIC RHINITIS 06/30/2007   Mood disorder ([place]) 03/24/2007   RSD lower limb 03/24/2007   Mild intermittent asthma in adult without complication [id number]   GERD 03/24/2007   SLEEP APNEA, MILD 03/24/2007   ALLERGY, ENVIRONMENTAL 03/24/2007   COLONIC POLYPS, HX OF 03/24/2007   Past Medical History:  Diagnosis Date   Allergy    Anxiety    Arthritis    "just the norm" (01/24/2013)   ASCVD (arteriosclerotic cardiovascular disease)    Asthma    Celiac disease    Daily headache    "depends on the season" (01/24/2013)   Diabetes mellitus without complication (HCC)    Fibromyalgia    GERD (gastroesophageal reflux disease)    History of colonic polyps    Hyperlipidemia    Hypertension    Migraines    Obesity (BMI 30.0-34.9)    RSD (reflex sympathetic dystrophy)    Sleep apnea    NO MACHINE RECOMMENDED   TIA (transient ischemic attack) ~ 2009   Past Surgical History:  Procedure Laterality Date   APPENDECTOMY  ~ 06/2007   BLADDER REPAIR  ~ 06/2007   "same day after bladder lift" (01/24/2013)   BLADDER SUSPENSION  ~ 06/2007   BREAST BIOPSY Left    DIAGNOSTIC LAPAROSCOPY  1990's & ~ 2000   "I've had a couple; for endometrosis" (01/24/2013)   [place] N/A 01/24/2013   Procedure: LAPAROSCOPIC INCISIONAL HERNIA;  Surgeon: Harl Bowie, MD;  Location: [facility];  Service: General;  Laterality: N/A;   INSERTION OF MESH N/A 01/24/2013   Procedure: INSERTION OF MESH;  Surgeon: Harl Bowie, MD;  Location: [facility];  Service: General;  Laterality: N/A;   LAPAROSCOPIC  INCISIONAL / UMBILICAL / [place]  01/24/2013   IHR w/mesh/notes   NASAL SEPTUM SURGERY  1980's?   OOPHORECTOMY Right 2009   TONSILLECTOMY  1990's   VAGINAL HYSTERECTOMY  ` 06/2007   Social History   Tobacco Use   Smoking status: Never   Smokeless tobacco: Never  Vaping Use   Vaping Use: Never used  Substance Use Topics   Alcohol use: Not Currently    Alcohol/week: 0.0 standard drinks    Comment: 01/24/2013 "drink or 2 once or /twice/year, if that"   Drug use: No   Family History  Problem Relation Age of Onset   Cancer Mother        lung   Hypertension Mother    Cancer Father        colon   Hypertension Sister    Cancer Sister    Cancer Brother    Hyperlipidemia Sister    Birth defects Maternal Grandmother        breast   Breast cancer Maternal Grandmother    Birth defects Paternal Grandmother        uterine, stomach, lung   Breast cancer Paternal Grandmother    Breast cancer Paternal Aunt    Allergies  Allergen Reactions   Meperidine Anaphylaxis, Itching and Swelling    REACTION: swelling   Cymbalta [Duloxetine Hcl] Hives    Generic makes her feel like she is crawling out of her skin   Diazepam Hives    REACTION: swelling   Other     Axe Cologne   Shellfish Allergy Hives   Current Outpatient Medications on File Prior to Visit  Medication Sig Dispense Refill   Accu-Chek Softclix Lancets lancets Use as instructed 100 each 5   albuterol (VENTOLIN HFA) 108 (90 Base) MCG/ACT inhaler Inhale 2 puffs into the lungs every 6 (six) hours as needed for wheezing or shortness of breath. 18 g 1   CYMBALTA 30 MG capsule Take 3 capsules (90 mg total) by mouth daily. 270 capsule 2   glucose blood (ACCU-CHEK GUIDE) test strip Use as instructed 100 each 5   HYDROcodone-acetaminophen (NORCO) 7.5-325 MG tablet TAKE 1 TABLET Twice a Day. One in the Morning and one at Bedtime. 60 tablet 0   ibuprofen (ADVIL) 600 MG tablet TAKE 1 TABLET (600 MG TOTAL) BY MOUTH EVERY 8  (EIGHT) HOURS AS NEEDED FOR MILD PAIN. 90 tablet 5   loratadine (CLARITIN) 10 MG tablet Take 10 mg by mouth daily.     metFORMIN (GLUCOPHAGE-XR) 500 MG 24 hr tablet Take 2 tablets (1,000 mg total) by mouth daily with breakfast. 60 tablet 3   metoCLOPramide (REGLAN) 10 MG tablet Take 10 mg by mouth 2 (two) times daily.     montelukast (SINGULAIR) 10 MG tablet TAKE  1 TABLET BY MOUTH EVERYDAY AT BEDTIME 90 tablet 3   olmesartan (BENICAR) 40 MG tablet TAKE 1 TABLET BY MOUTH EVERY DAY 90 tablet 3   omeprazole (PRILOSEC) 40 MG capsule Take 40 mg by mouth 2 (two) times daily.     SYMBICORT 160-4.5 MCG/ACT inhaler Inhale 2 puffs into the lungs daily. 1 each 11   tiZANidine (ZANAFLEX) 4 MG tablet TAKE 1 TABLET (4 MG TOTAL) BY MOUTH 4 (FOUR) TIMES DAILY. 120 tablet 3   topiramate (TOPAMAX) 100 MG tablet TAKE 1 TABLET BY MOUTH EVERY MORNING AND TAKE 2 TABLETS BY MOUTH EVERY EVENING 90 tablet 5   traMADol (ULTRAM) 50 MG tablet TAKE 1 TABLET BY MOUTH TWICE A DAY. DIRECTION CHANGE. 60 tablet 2   traMADol (ULTRAM-ER) 100 MG 24 hr tablet Take 1 tablet (100 mg total) by mouth daily. 30 tablet 2   traZODone (DESYREL) 50 MG tablet TAKE 1.5 TABLETS BY MOUTH AT BEDTIME 135 tablet 2   [DISCONTINUED] losartan (COZAAR) 100 MG tablet TAKE 1 TABLET BY MOUTH EVERY DAY 90 tablet 0   No current facility-administered medications on file prior to visit.    Review of Systems  Constitutional:  Negative for chills, fever and malaise/fatigue.  HENT:  Negative for congestion, ear pain, sinus pain and sore throat.   Eyes:  Negative for blurred vision, discharge and redness.  Respiratory:  Negative for cough, sputum production, shortness of breath, wheezing and stridor.   Cardiovascular:  Negative for chest pain, palpitations and leg swelling.  Gastrointestinal:  Negative for abdominal pain, diarrhea, nausea and vomiting.  Musculoskeletal:  Negative for myalgias.  Skin:  Positive for itching and rash.  Neurological:  Negative  for dizziness and headaches.   Observations/Objective: Pt sounds well/not distressed  No swelling noted in face/lips/eyes or mouth  Nl speech/no evidence of swollen tongue  Not hoarse  No slurred speech Unable to appreciate rash on face on camera  Nl cognition, good historian No cough or sob or wheezing with speech Nl mood   Assessment and Plan: Problem List Items Addressed This Visit       Other   Allergic reaction caused by a drug    Pt was taking docycycline for a sinus infection  Developed rash/ tight feeling chest and lump in throat last night  Significantly improved with benadryl now - left with itching and funny feeling in throat (no throat swelling or sob or wheeze)  For symptoms will treat with low dose prednisone taper (30 mg)  Disc side eff like hunger, jitteriness and elevated glucose (she will monitor)  Urged to continue benadryl and monitor carefully  Discussed s/d of angioedema and anaphylaxis to watch for- ER precautions discussed  Will continue to follow Of note-sinus symptoms are better         Follow Up Instructions: Continue benadryl as needed  Use albuterol inhaler if needed for wheezing (this is your rescue inhaler) Take prednisone as directed It may make you feel hyper and hungry and it will make your glucose readings go up while you are on it-so please keep [place] posted   Update if not starting to improve in a week or if worsening     I discussed the assessment and treatment plan with the patient. The patient was provided an opportunity to ask questions and all were answered. The patient agreed with the plan and demonstrated an understanding of the instructions.   The patient was advised to call back or seek an in-person evaluation if  the symptoms worsen or if the condition fails to improve as anticipated.     Loura Pardon, MD

## 2021-11-11 NOTE — Patient Instructions (Signed)
Continue benadryl as needed  Use albuterol inhaler if needed for wheezing (this is your rescue inhaler) Take prednisone as directed It may make you feel hyper and hungry and it will make your glucose readings go up while you are on it-so please keep [place] posted   Update if not starting to improve in a week or if worsening

## 2021-11-11 NOTE — Telephone Encounter (Signed)
PLEASE NOTE: All timestamps contained within this report are represented as [place] Standard Time. CONFIDENTIALTY NOTICE: This fax transmission is intended only for the addressee. It contains information that is legally privileged, confidential or otherwise protected from use or disclosure. If you are not the intended recipient, you are strictly prohibited from reviewing, disclosing, copying using or disseminating any of this information or taking any action in reliance on or regarding this information. If you have received this fax in error, please notify us immediately by telephone so that we can arrange for its return to [place]. Phone: [phone], Toll-Free: [phone], Fax: [phone] Page: 1 of 2 Call Id: [id number] [facility] Day - Client TELEPHONE ADVICE RECORD AccessNurse Patient Name: [name] Gender: Female DOB: [birth year] Age: 56 Y 7 M 23 D Return Phone Number: [phone] (Primary) Address: [place]/ [place]  [address] Client [facility] Day - Client Client Site [facility] - Day Provider Tillman Abide- MD Contact Type Call Who Is Calling Patient / Member / Family / Caregiver Call Type Triage / Clinical Relationship To Patient Self Return Phone Number [phone] (Primary) Chief Complaint CHEST PAIN - pain, pressure, heaviness or tightness Reason for Call Symptomatic / Request for Health Information Initial Comment Caller states antibiotic is giving her allergic reaction. Caller states she has a rash, facial swelling and feels lump in throat (little trouble swallowing) and feels a heavy chest. Translation No Nurse Assessment Nurse: Freida Busman, RN, Jonette Eva Date/Time (Eastern Time): 11/11/2021 1:44:31 PM Confirm and document reason for call. If symptomatic, describe symptoms. ---Caller states she took an antibiotic last week and finished it S/s started last wednesday Says she  has lump in the throat and is itchy no swelling at this time but she had facial swellling Denies chest pain Denies shortness of breath States the lump makes her sound hoarse Does the patient have any new or worsening symptoms? ---Yes Will a triage be completed? ---Yes Related visit to physician within the last 2 weeks? ---Yes Does the PT have any chronic conditions? (i.e. diabetes, asthma, this includes High risk factors for pregnancy, etc.) ---Yes List chronic conditions. ---DM htn [place] fibromyalagia rsd Is this a behavioral health or substance abuse call? ---No Guidelines Guideline Title Affirmed Question Affirmed Notes Nurse Date/Time (Eastern Time) Rash - Widespread On Drugs Patient sounds very sick or weak to the triager Freida Busman, RN, Jonette Eva 11/11/2021 1:48:14 PM PLEASE NOTE: All timestamps contained within this report are represented as [place] Standard Time. CONFIDENTIALTY NOTICE: This fax transmission is intended only for the addressee. It contains information that is legally privileged, confidential or otherwise protected from use or disclosure. If you are not the intended recipient, you are strictly prohibited from reviewing, disclosing, copying using or disseminating any of this information or taking any action in reliance on or regarding this information. If you have received this fax in error, please notify us immediately by telephone so that we can arrange for its return to [place]. Phone: [phone], Toll-Free: [phone], Fax: [phone] Page: 2 of 2 Call Id: [id number] Disp. Time Lamount Cohen Time) Disposition Final User 11/11/2021 1:43:08 PM Send to Urgent Queue Mardee Postin 11/11/2021 1:51:27 PM Go to ED Now (or PCP triage) Yes Freida Busman, RN, Marlowe Aschoff Disagree/Comply Disagree Caller Understands Yes PreDisposition Call Doctor Care Advice Given Per Guideline GO TO ED NOW (OR PCP TRIAGE): * IF NO PCP (PRIMARY CARE PROVIDER) SECOND-LEVEL TRIAGE: You need to be seen  within the next hour. Go  to the ED/UCC at _____________ Hospital. Leave as soon as you can. ANOTHER ADULT SHOULD DRIVE: * It is better and safer if another adult drives instead of you. CARE ADVICE given per Rash - Widespread on Drugs (Adult) guideline Comments User: Felipe Drone, RN Date/Time Lamount Cohen Time): 11/11/2021 1:56:06 PM Patient refuses to go to ER she is advised to call back to office for possible televisit for MD asses/treatment since she will not go to ER Referrals GO TO FACILITY REFUSED REFERRED TO PCP OFFICE [facility] - ED

## 2021-11-11 NOTE — Telephone Encounter (Signed)
Spoke to patient by telephone and was advised that she took her last antibiotic pill last night. Patient stated that she was feeling worse last night. Patient stated that she still has a rash and feels like there is a lump in her throat. Patient denies chest pain, SOB, difficulty breathing or tongue swollen. Patient stated that she does not feel that she needs to go to the ER. Patient has a virtual visit scheduled with Dr. Milinda Antis today 11/11/21 at 3:30 pm. Patient was given ER/911 precautions and she verbalized understanding.

## 2021-11-11 NOTE — Assessment & Plan Note (Signed)
Pt was taking docycycline for a sinus infection  Developed rash/ tight feeling chest and lump in throat last night  Significantly improved with benadryl now - left with itching and funny feeling in throat (no throat swelling or sob or wheeze)  For symptoms will treat with low dose prednisone taper (30 mg)  Disc side eff like hunger, jitteriness and elevated glucose (she will monitor)  Urged to continue benadryl and monitor carefully  Discussed s/d of angioedema and anaphylaxis to watch for- ER precautions discussed  Will continue to follow Of note-sinus symptoms are better

## 2021-11-12 ENCOUNTER — Other Ambulatory Visit: Payer: Self-pay | Admitting: Internal Medicine

## 2021-11-16 LAB — DRUG TOX MONITOR 1 W/CONF, ORAL FLD
Alprazolam: NEGATIVE ng/mL (ref <0.50)
Amphetamines: NEGATIVE ng/mL (ref <10)
Barbiturates: NEGATIVE ng/mL (ref <10)
Benzodiazepines: NEGATIVE ng/mL (ref <0.50)
Buprenorphine: NEGATIVE ng/mL (ref <0.10)
Clonazepam: NEGATIVE ng/mL (ref <0.50)
Cocaine: NEGATIVE ng/mL (ref <5.0)
Codeine: NEGATIVE ng/mL (ref <2.5)
Diazepam: NEGATIVE ng/mL (ref <0.50)
Dihydrocodeine: 3.8 ng/mL — ABNORMAL HIGH (ref <2.5)
Fentanyl: NEGATIVE ng/mL (ref <0.10)
Flunitrazepam: NEGATIVE ng/mL (ref <0.50)
Flurazepam: NEGATIVE ng/mL (ref <0.50)
Heroin Metabolite: NEGATIVE ng/mL (ref <1.0)
Hydrocodone: 72.7 ng/mL — ABNORMAL HIGH (ref <2.5)
Hydromorphone: NEGATIVE ng/mL (ref <2.5)
Lorazepam: NEGATIVE ng/mL (ref <0.50)
MARIJUANA: NEGATIVE ng/mL (ref <2.5)
MDMA: NEGATIVE ng/mL (ref <10)
Meprobamate: NEGATIVE ng/mL (ref <2.5)
Methadone: NEGATIVE ng/mL (ref <5.0)
Midazolam: NEGATIVE ng/mL (ref <0.50)
Morphine: NEGATIVE ng/mL (ref <2.5)
Nicotine Metabolite: NEGATIVE ng/mL (ref <5.0)
Nordiazepam: NEGATIVE ng/mL (ref <0.50)
Norhydrocodone: 9 ng/mL — ABNORMAL HIGH (ref <2.5)
Noroxycodone: NEGATIVE ng/mL (ref <2.5)
Opiates: POSITIVE ng/mL — AB (ref <2.5)
Oxazepam: NEGATIVE ng/mL (ref <0.50)
Oxycodone: NEGATIVE ng/mL (ref <2.5)
Oxymorphone: NEGATIVE ng/mL (ref <2.5)
Phencyclidine: NEGATIVE ng/mL (ref <10)
Tapentadol: NEGATIVE ng/mL (ref <5.0)
Temazepam: NEGATIVE ng/mL (ref <0.50)
Tramadol: >500 ng/mL — ABNORMAL HIGH (ref <5.0)
Tramadol: POSITIVE ng/mL — AB (ref <5.0)
Triazolam: NEGATIVE ng/mL (ref <0.50)
Zolpidem: NEGATIVE ng/mL (ref <5.0)

## 2021-11-16 LAB — DRUG TOX ALC METAB W/CON, ORAL FLD

## 2021-11-17 ENCOUNTER — Telehealth: Payer: Self-pay | Admitting: *Deleted

## 2021-11-17 NOTE — Telephone Encounter (Signed)
Oral swab drug screen was consistent for prescribed medications.  ?

## 2021-12-09 ENCOUNTER — Encounter: Payer: Self-pay | Admitting: Registered Nurse

## 2021-12-09 ENCOUNTER — Other Ambulatory Visit: Payer: Self-pay

## 2021-12-09 ENCOUNTER — Encounter: Payer: Worker's Compensation | Attending: Registered Nurse | Admitting: Registered Nurse

## 2021-12-09 VITALS — BP 132/79 | HR 83 | Ht 67.0 in | Wt 175.4 lb

## 2021-12-09 DIAGNOSIS — G4700 Insomnia, unspecified: Secondary | ICD-10-CM | POA: Insufficient documentation

## 2021-12-09 DIAGNOSIS — F3289 Other specified depressive episodes: Secondary | ICD-10-CM | POA: Insufficient documentation

## 2021-12-09 DIAGNOSIS — G90512 Complex regional pain syndrome I of left upper limb: Secondary | ICD-10-CM | POA: Insufficient documentation

## 2021-12-09 DIAGNOSIS — Z5181 Encounter for therapeutic drug level monitoring: Secondary | ICD-10-CM | POA: Insufficient documentation

## 2021-12-09 DIAGNOSIS — M62838 Other muscle spasm: Secondary | ICD-10-CM | POA: Insufficient documentation

## 2021-12-09 DIAGNOSIS — Z79891 Long term (current) use of opiate analgesic: Secondary | ICD-10-CM | POA: Diagnosis present

## 2021-12-09 DIAGNOSIS — G894 Chronic pain syndrome: Secondary | ICD-10-CM | POA: Insufficient documentation

## 2021-12-09 MED ORDER — HYDROCODONE-ACETAMINOPHEN 7.5-325 MG PO TABS: ORAL_TABLET | ORAL | 0 refills | Status: DC

## 2021-12-09 MED ORDER — TOPIRAMATE 100 MG PO TABS: ORAL_TABLET | ORAL | 5 refills | Status: DC

## 2021-12-09 MED ORDER — TRAZODONE HCL 50 MG PO TABS: ORAL_TABLET | ORAL | 2 refills | Status: DC

## 2021-12-09 MED ORDER — TIZANIDINE HCL 4 MG PO TABS: ORAL_TABLET | ORAL | 3 refills | Status: DC

## 2021-12-09 NOTE — Progress Notes (Signed)
Subjective:    Patient ID: [name], female    DOB: 01-Dec-1965, 56 y.o.   MRN: [id number]  HPI: [name] is a 56 y.o. female who returns for follow up appointment for chronic pain and medication refill. She states her pain is located in her left arm and left wrist. He rates his pain 6. Her current exercise regime is walking and performing stretching exercises.  [name] Morphine equivalent is 35.00 MME.   Last Oral Swab was Performed on 11/10/2021, it was consistent.     Pain Inventory Average Pain 6 Pain Right Now 6 My pain is constant, sharp, burning, stabbing, tingling, and aching  In the last 24 hours, has pain interfered with the following? General activity 5 Relation with others 5 Enjoyment of life 5 What TIME of day is your pain at its worst? morning , daytime, evening, and night Sleep (in general) Poor  Pain is worse with: walking, bending, sitting, standing, unsure, and some activites Pain improves with: rest, heat/ice, pacing activities, medication, and TENS Relief from Meds: 6  Family History  Problem Relation Age of Onset   Cancer Mother        lung   Hypertension Mother    Cancer Father        colon   Hypertension Sister    Cancer Sister    Cancer Brother    Hyperlipidemia Sister    Birth defects Maternal Grandmother        breast   Breast cancer Maternal Grandmother    Birth defects Paternal Grandmother        uterine, stomach, lung   Breast cancer Paternal Grandmother    Breast cancer Paternal Aunt    Social History   Socioeconomic History   Marital status: Married    Spouse name: Not on file   Number of children: 2   Years of education: Not on file   Highest education level: Not on file  Occupational History   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  Tobacco Use   Smoking status: Never   Smokeless tobacco: Never  Vaping Use   Vaping Use: Never used  Substance and Sexual Activity   Alcohol use: Not Currently     Alcohol/week: 0.0 standard drinks    Comment: 01/24/2013 "drink or 2 once or /twice/year, if that"   Drug use: No   Sexual activity: Not Currently  Other Topics Concern   Not on file  Social History Narrative   No living will   Would want husband as POA--then sister, [name]   Would accept resuscitation attempts   Probably would not want tube feeds if cognitively unaware   Social Determinants of Health   Financial Resource Strain: Not on file  Food Insecurity: Not on file  Transportation Needs: Not on file  Physical Activity: Not on file  Stress: Not on file  Social Connections: Not on file   Past Surgical History:  Procedure Laterality Date   APPENDECTOMY  ~ 06/2007   BLADDER REPAIR  ~ 06/2007   "same day after bladder lift" (01/24/2013)   BLADDER SUSPENSION  ~ 06/2007   BREAST BIOPSY Left    DIAGNOSTIC LAPAROSCOPY  1990's & ~ 2000   "I've had a couple; for endometrosis" (01/24/2013)   HERNIA REPAIR     INCISIONAL HERNIA REPAIR N/A 01/24/2013   Procedure: LAPAROSCOPIC INCISIONAL HERNIA;  Surgeon: Shelly Rubenstein, MD;  [facility];  Service: General;  Laterality: N/A;   INSERTION OF MESH N/A 01/24/2013  Procedure: INSERTION OF MESH;  Surgeon: Shelly Rubenstein, MD;  [facility];  Service: General;  Laterality: N/A;   LAPAROSCOPIC INCISIONAL / UMBILICAL / VENTRAL HERNIA REPAIR  01/24/2013   IHR w/mesh/notes   NASAL SEPTUM SURGERY  1980's?   OOPHORECTOMY Right 2009   TONSILLECTOMY  1990's   VAGINAL HYSTERECTOMY  ` 06/2007   Past Surgical History:  Procedure Laterality Date   APPENDECTOMY  ~ 06/2007   BLADDER REPAIR  ~ 06/2007   "same day after bladder lift" (01/24/2013)   BLADDER SUSPENSION  ~ 06/2007   BREAST BIOPSY Left    DIAGNOSTIC LAPAROSCOPY  1990's & ~ 2000   "I've had a couple; for endometrosis" (01/24/2013)   HERNIA REPAIR     INCISIONAL HERNIA REPAIR N/A 01/24/2013   Procedure: LAPAROSCOPIC INCISIONAL HERNIA;  Surgeon: Shelly Rubenstein, MD;  [facility];   Service: General;  Laterality: N/A;   INSERTION OF MESH N/A 01/24/2013   Procedure: INSERTION OF MESH;  Surgeon: Shelly Rubenstein, MD;  [facility];  Service: General;  Laterality: N/A;   LAPAROSCOPIC INCISIONAL / UMBILICAL / VENTRAL HERNIA REPAIR  01/24/2013   IHR w/mesh/notes   NASAL SEPTUM SURGERY  1980's?   OOPHORECTOMY Right 2009   TONSILLECTOMY  1990's   VAGINAL HYSTERECTOMY  ` 06/2007   Past Medical History:  Diagnosis Date   Allergy    Anxiety    Arthritis    "just the norm" (01/24/2013)   ASCVD (arteriosclerotic cardiovascular disease)    Asthma    Celiac disease    Daily headache    "depends on the season" (01/24/2013)   Diabetes mellitus without complication (HCC)    Fibromyalgia    GERD (gastroesophageal reflux disease)    History of colonic polyps    Hyperlipidemia    Hypertension    Migraines    Obesity (BMI 30.0-34.9)    RSD (reflex sympathetic dystrophy)    Sleep apnea    NO MACHINE RECOMMENDED   TIA (transient ischemic attack) ~ 2009   BP 132/79    Pulse 83    Ht 5\' 7"  (1.702 m)    Wt 175 lb 6.4 oz (79.6 kg)    SpO2 95%    BMI 27.47 kg/m   Opioid Risk Score:   Fall Risk Score:  `1  Depression screen PHQ 2/9  Depression screen [facility] 2/9 12/09/2021 11/10/2021 10/06/2021 08/18/2021 06/12/2021 05/12/2021 04/14/2021  Decreased Interest 1 1 1 1 1 1 1   Down, Depressed, Hopeless 1 1 1 1 1 1 1   PHQ - 2 Score 2 2 2 2 2 2 2   Altered sleeping - - - 3 - - -  Tired, decreased energy - - - 3 - - -  Change in appetite - - - 1 - - -  Feeling bad or failure about yourself  - - - 1 - - -  Trouble concentrating - - - 1 - - -  Moving slowly or fidgety/restless - - - 1 - - -  Suicidal thoughts - - - 0 - - -  PHQ-9 Score - - - 12 - - -  Difficult doing work/chores - - - Somewhat difficult - - -  Some recent data might be hidden     Review of Systems  Musculoskeletal:        Left arm pain  All other systems reviewed and are negative.     Objective:   Physical  Exam Vitals and nursing note reviewed.  Constitutional:      Appearance: Normal appearance.  Neck:     Comments: Cervical Paraspinal Tenderness: C-5-C-6 Cardiovascular:     Rate and Rhythm: Normal rate and regular rhythm.     Pulses: Normal pulses.     Heart sounds: Normal heart sounds.  Pulmonary:     Effort: Pulmonary effort is normal.     Breath sounds: Normal breath sounds.  Musculoskeletal:     Cervical back: Normal range of motion and neck supple.     Comments: Normal Muscle Bulk and Muscle Testing Reveals:  Upper Extremities: Right: Full ROM and Muscle Strength 4/5 Left: Upper Extremity: Decreased ROM 90 Degrees and Muscle Strength 2/5 Bilateral AC Joint Tenderness Thoracic and Lumbar Hypersensitivity  Lower Extremities: Full ROM and Muscle Strength 5/5 Arises from Table Slowly Antalgic  Gait     Skin:    General: Skin is warm and dry.  Neurological:     Mental Status: She is alert and oriented to person, place, and time.  Psychiatric:        Mood and Affect: Mood normal.        Behavior: Behavior normal.         Assessment & Plan:  1.Complex Regional Pain Syndrome Type 1: Continue Topamax and Cymbalta. 12/09/2021. Refilled: Hydro-codone 7.5/325mg  one tablet twice a day one in the morning and one at bedtime #60. Continue Tramadol 100 mg ER daily #30 and Tramadol 50 mg one tablet by mouth BID. One tablet in the afternoon and one tablet in the evening. We will continue the opioid monitoring program, this consists of regular clinic visits, examinations, urine drug screen, pill counts as well as use of [place] Controlled Substance Reporting system. A 12 month History has been reviewed on the [place] Controlled Substance Reporting System  On 12/09/2021. 2. Depression: Continue current medication regimen with Cymbalta.12/09/2021 3. Insomnia: Continue current medication regimen with  Trazodone one and half tablet at bedtime  12/09/2021 4. Muscle Spasms:  Continue current medication regimen with Tizanidine. 12/09/2021 5. Chronic Pain Syndrome: Continue Ibuprofen BID as needed. 12/09/2021   F/U in 1 month

## 2021-12-14 ENCOUNTER — Telehealth: Payer: Self-pay | Admitting: Internal Medicine

## 2021-12-14 NOTE — Telephone Encounter (Signed)
error 

## 2021-12-17 ENCOUNTER — Other Ambulatory Visit: Payer: Self-pay

## 2021-12-17 ENCOUNTER — Encounter: Payer: Self-pay | Admitting: Internal Medicine

## 2021-12-17 ENCOUNTER — Ambulatory Visit (INDEPENDENT_AMBULATORY_CARE_PROVIDER_SITE_OTHER): Payer: Medicare HMO | Admitting: Internal Medicine

## 2021-12-17 VITALS — BP 140/78 | HR 78 | Temp 98.1°F | Ht 67.0 in | Wt 174.0 lb

## 2021-12-17 DIAGNOSIS — R0989 Other specified symptoms and signs involving the circulatory and respiratory systems: Secondary | ICD-10-CM

## 2021-12-17 DIAGNOSIS — J301 Allergic rhinitis due to pollen: Secondary | ICD-10-CM

## 2021-12-17 MED ORDER — PREDNISONE 20 MG PO TABS: 40.0000 mg | ORAL_TABLET | Freq: Every day | ORAL | 0 refills | Status: DC

## 2021-12-17 NOTE — Assessment & Plan Note (Signed)
Since allergic reaction ?Most likely related to reflux --though on omeprazole 40 ?Will refer to ENT for laryngoscopy ?

## 2021-12-17 NOTE — Progress Notes (Signed)
? ?Subjective:  ? ? Patient ID: [name], female    DOB: Dec 12, 1965, 56 y.o.   MRN: [id number] ? ?HPI ?Here due to abnormal sensation in her throat ? ?She feels "like someone has a thumb pressing on my throat" ?Has persisted since allergic reaction to antibiotic in January ?Senses it inside throat ?No change in voice ?No trouble swallowing ? ?Does have some heartburn--despite the omeprazole ?Does occasionally awaken with water brash and burning --perhaps once a month ("I feel like I am dying") ?Doesn't eat for hours before sleep ? ?Having some irritation in left eye also ? ?Current Outpatient Medications on File Prior to Visit  ?Medication Sig Dispense Refill  ? Accu-Chek Softclix Lancets lancets Use as instructed 100 each 5  ? albuterol (VENTOLIN HFA) 108 (90 Base) MCG/ACT inhaler Inhale 2 puffs into the lungs every 6 (six) hours as needed for wheezing or shortness of breath. 18 g 1  ? CYMBALTA 30 MG capsule Take 3 capsules (90 mg total) by mouth daily. 270 capsule 2  ? glucose blood (ACCU-CHEK GUIDE) test strip Use as instructed 100 each 5  ? HYDROcodone-acetaminophen (NORCO) 7.5-325 MG tablet TAKE 1 TABLET Twice a Day. One in the Morning and one at Bedtime. 60 tablet 0  ? ibuprofen (ADVIL) 600 MG tablet TAKE 1 TABLET (600 MG TOTAL) BY MOUTH EVERY 8 (EIGHT) HOURS AS NEEDED FOR MILD PAIN. 90 tablet 5  ? loratadine (CLARITIN) 10 MG tablet Take 10 mg by mouth daily.    ? metFORMIN (GLUCOPHAGE-XR) 500 MG 24 hr tablet TAKE 2 TABLETS BY MOUTH EVERY DAY WITH BREAKFAST 180 tablet 3  ? metoCLOPramide (REGLAN) 10 MG tablet Take 10 mg by mouth 2 (two) times daily.    ? montelukast (SINGULAIR) 10 MG tablet TAKE 1 TABLET BY MOUTH EVERYDAY AT BEDTIME 90 tablet 3  ? olmesartan (BENICAR) 40 MG tablet TAKE 1 TABLET BY MOUTH EVERY DAY 90 tablet 3  ? omeprazole (PRILOSEC) 40 MG capsule Take 40 mg by mouth 2 (two) times daily.    ? predniSONE (DELTASONE) 10 MG tablet Take 3 pills once daily by mouth for 3 days, then 2  pills once daily for 3 days, then 1 pill once daily for 3 days and then stop 18 tablet 0  ? SYMBICORT 160-4.5 MCG/ACT inhaler Inhale 2 puffs into the lungs daily. 1 each 11  ? tiZANidine (ZANAFLEX) 4 MG tablet TAKE 1 TABLET (4 MG TOTAL) BY MOUTH 4 (FOUR) TIMES DAILY. 120 tablet 3  ? topiramate (TOPAMAX) 100 MG tablet TAKE 1 TABLET BY MOUTH EVERY MORNING AND TAKE 2 TABLETS BY MOUTH EVERY EVENING 90 tablet 5  ? traMADol (ULTRAM) 50 MG tablet TAKE 1 TABLET BY MOUTH TWICE A DAY. DIRECTION CHANGE. 60 tablet 2  ? traMADol (ULTRAM-ER) 100 MG 24 hr tablet Take 1 tablet (100 mg total) by mouth daily. 30 tablet 2  ? traZODone (DESYREL) 50 MG tablet TAKE 1.5 TABLETS BY MOUTH AT BEDTIME 135 tablet 2  ? [DISCONTINUED] losartan (COZAAR) 100 MG tablet TAKE 1 TABLET BY MOUTH EVERY DAY 90 tablet 0  ? ?No current facility-administered medications on file prior to visit.  ? ? ?Allergies  ?Allergen Reactions  ? Meperidine Anaphylaxis, Itching and Swelling  ?  REACTION: swelling  ? Cymbalta [Duloxetine Hcl] Hives  ?  Generic makes her feel like she is crawling out of her skin  ? Diazepam Hives  ?  REACTION: swelling  ? Other   ?  Axe Cologne  ? Shellfish  Allergy Hives  ? ? ?Past Medical History:  ?Diagnosis Date  ? Allergy   ? Anxiety   ? Arthritis   ? "just the norm" (01/24/2013)  ? ASCVD (arteriosclerotic cardiovascular disease)   ? Asthma   ? Celiac disease   ? Daily headache   ? "depends on the season" (01/24/2013)  ? Diabetes mellitus without complication ([place])   ? Fibromyalgia   ? GERD (gastroesophageal reflux disease)   ? History of colonic polyps   ? Hyperlipidemia   ? Hypertension   ? Migraines   ? Obesity (BMI 30.0-34.9)   ? RSD (reflex sympathetic dystrophy)   ? Sleep apnea   ? NO MACHINE RECOMMENDED  ? TIA (transient ischemic attack) ~ 2009  ? ? ?Past Surgical History:  ?Procedure Laterality Date  ? APPENDECTOMY  ~ 06/2007  ? BLADDER REPAIR  ~ 06/2007  ? "same day after bladder lift" (01/24/2013)  ? BLADDER SUSPENSION  ~ 06/2007  ?  BREAST BIOPSY Left   ? DIAGNOSTIC LAPAROSCOPY  1990's & ~ 2000  ? "I've had a couple; for endometrosis" (01/24/2013)  ? HERNIA REPAIR    ? INCISIONAL HERNIA REPAIR N/A 01/24/2013  ? Procedure: LAPAROSCOPIC INCISIONAL HERNIA;  Surgeon: Harl Bowie, MD;  Location: [facility];  Service: General;  Laterality: N/A;  ? INSERTION OF MESH N/A 01/24/2013  ? Procedure: INSERTION OF MESH;  Surgeon: Harl Bowie, MD;  Location: [facility];  Service: General;  Laterality: N/A;  ? LAPAROSCOPIC INCISIONAL / UMBILICAL / [place]  01/24/2013  ? IHR w/mesh/notes  ? NASAL SEPTUM SURGERY  1980's?  ? OOPHORECTOMY Right 2009  ? TONSILLECTOMY  1990's  ? VAGINAL HYSTERECTOMY  ` 06/2007  ? ? ?Family History  ?Problem Relation Age of Onset  ? Cancer Mother   ?     lung  ? Hypertension Mother   ? Cancer Father   ?     colon  ? Hypertension Sister   ? Cancer Sister   ? Cancer Brother   ? Hyperlipidemia Sister   ? Birth defects Maternal Grandmother   ?     breast  ? Breast cancer Maternal Grandmother   ? Birth defects Paternal Grandmother   ?     uterine, stomach, lung  ? Breast cancer Paternal Grandmother   ? Breast cancer Paternal Aunt   ? ? ?Social History  ? ?Socioeconomic History  ? Marital status: Married  ?  Spouse name: Not on file  ? Number of children: 2  ? Years of education: Not on file  ? Highest education level: Not on file  ?Occupational History  ? 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 Smoking status: Never  ? Smokeless tobacco: Never  ?Vaping Use  ? Vaping Use: Never used  ?Substance and Sexual Activity  ? Alcohol use: Not Currently  ?  Alcohol/week: 0.0 standard drinks  ?  Comment: 01/24/2013 "drink or 2 once or /twice/year, if that"  ? Drug use: No  ? Sexual activity: Not Currently  ?Other Topics Concern  ? Not on file  ?Social History Narrative  ? No living will  ? Would want husband as POA--then sister, [name]  ? Would accept resuscitation attempts  ? Probably would not want tube feeds if cognitively unaware   ? ?Social Determinants of Health  ? ?Financial Resource Strain: Not on file  ?Food Insecurity: Not on file  ?Transportation Needs: Not on file  ?Physical Activity: Not on file  ?  Stress: Not on file  ?Social Connections: Not on file  ?Intimate Partner Violence: Not on file  ? ?Review of Systems ?No N/V ?Appetite is okay ?No oral lesions ?   ?Objective:  ? Physical Exam ?Constitutional:   ?   Appearance: Normal appearance.  ?HENT:  ?   Mouth/Throat:  ?   Pharynx: No oropharyngeal exudate or posterior oropharyngeal erythema.  ?Eyes:  ?   Comments: No conjunctival injection  ?Musculoskeletal:  ?   Cervical back: Neck supple.  ?Lymphadenopathy:  ?   Cervical: No cervical adenopathy.  ?Neurological:  ?   Mental Status: She is alert.  ?  ? ? ? ? ?   ?Assessment & Plan:  ? ?

## 2021-12-17 NOTE — Addendum Note (Signed)
Addended by: Viviana Simpler I on: 12/17/2021 03:26 PM ? ? Modules accepted: Orders ? ?

## 2021-12-17 NOTE — Assessment & Plan Note (Addendum)
Also has conjunctivitis now ?Discussed adding loratadine to the cetirizine and montelukast ?Needs to take the flonase every day--and even bid for a few days ?Can try OTC eye drops as well (naphcon-A) ?Will give 3 days of prednisone also ?

## 2021-12-24 ENCOUNTER — Encounter: Payer: Self-pay | Admitting: *Deleted

## 2021-12-25 ENCOUNTER — Telehealth: Payer: Self-pay | Admitting: Registered Nurse

## 2021-12-25 ENCOUNTER — Ambulatory Visit (INDEPENDENT_AMBULATORY_CARE_PROVIDER_SITE_OTHER): Payer: Medicare HMO | Admitting: Nurse Practitioner

## 2021-12-25 ENCOUNTER — Other Ambulatory Visit: Payer: Self-pay

## 2021-12-25 VITALS — BP 124/80 | HR 106 | Temp 97.3°F | Resp 14 | Ht 67.0 in | Wt 176.4 lb

## 2021-12-25 DIAGNOSIS — B029 Zoster without complications: Secondary | ICD-10-CM | POA: Diagnosis not present

## 2021-12-25 MED ORDER — VALACYCLOVIR HCL 1 G PO TABS: 1000.0000 mg | ORAL_TABLET | Freq: Three times a day (TID) | ORAL | 0 refills | Status: AC

## 2021-12-25 MED ORDER — HYDROCODONE-ACETAMINOPHEN 7.5-325 MG PO TABS: ORAL_TABLET | ORAL | 0 refills | Status: DC

## 2021-12-25 NOTE — Telephone Encounter (Addendum)
Placed a call to [name] she states her autistic granddaughter threw away her medication ( Hydrocdone), she admits to leaving medication on the counter.  ?Educated on keeping medication in her lock cabinet at all times, she states she usually has her medication locked up. Last night she wasn't feeling well she reports.  The above discussed with Dr Letta Pate, we will send a prescription of Hydrocodone. Placed a call to [name] regarding the above, she verbalizes understanding.   ? ?

## 2021-12-25 NOTE — Progress Notes (Signed)
Acute Office Visit  Subjective:    Patient ID: [name], female    DOB: May 31, 1966, 56 y.o.   MRN: [id number]  Chief Complaint  Patient presents with   Rash    Started about 4 days ago, noticed a rash patch near left shoulder blade, Was burning, raised and itching and is better now.      Patient is in today for Rash  States that she had noticed the rash on Monday on the posterior left shoulder. States that it was itching burning states that it effected the arm down to her elbow with pain. States that she has had chicken pox as a child. Has not had the shingles vaccine as of yet. She was under the impression that it is not covered under her insurance.   Past Medical History:  Diagnosis Date   Allergy    Anxiety    Arthritis    "just the norm" (01/24/2013)   ASCVD (arteriosclerotic cardiovascular disease)    Asthma    Celiac disease    Daily headache    "depends on the season" (01/24/2013)   Diabetes mellitus without complication ([facility])    Fibromyalgia    GERD (gastroesophageal reflux disease)    History of colonic polyps    Hyperlipidemia    Hypertension    Migraines    Obesity (BMI 30.0-34.9)    RSD (reflex sympathetic dystrophy)    Sleep apnea    NO MACHINE RECOMMENDED   TIA (transient ischemic attack) ~ 2009    Past Surgical History:  Procedure Laterality Date   APPENDECTOMY  ~ 06/2007   BLADDER REPAIR  ~ 06/2007   "same day after bladder lift" (01/24/2013)   BLADDER SUSPENSION  ~ 06/2007   BREAST BIOPSY Left    DIAGNOSTIC LAPAROSCOPY  1990's & ~ 2000   "I've had a couple; for endometrosis" (01/24/2013)   [place] N/A 01/24/2013   Procedure: LAPAROSCOPIC INCISIONAL HERNIA;  Surgeon: Harl Bowie, MD;  Location: [facility];  Service: General;  Laterality: N/A;   INSERTION OF MESH N/A 01/24/2013   Procedure: INSERTION OF MESH;  Surgeon: Harl Bowie, MD;  Location: [place];  Service: General;  Laterality: N/A;    LAPAROSCOPIC INCISIONAL / UMBILICAL / [place]  01/24/2013   IHR w/mesh/notes   NASAL SEPTUM SURGERY  1980's?   OOPHORECTOMY Right 2009   TONSILLECTOMY  1990's   VAGINAL HYSTERECTOMY  ` 06/2007    Family History  Problem Relation Age of Onset   Cancer Mother        lung   Hypertension Mother    Cancer Father        colon   Hypertension Sister    Cancer Sister    Cancer Brother    Hyperlipidemia Sister    Birth defects Maternal Grandmother        breast   Breast cancer Maternal Grandmother    Birth defects Paternal Grandmother        uterine, stomach, lung   Breast cancer Paternal Grandmother    Breast cancer Paternal Aunt     Social History   Socioeconomic History   Marital status: Married    Spouse name: Not on file   Number of children: 2   Years of education: Not on file   Highest education level: Not on file  Occupational History   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  Tobacco Use   Smoking status: Never  Smokeless tobacco: Never  Vaping Use   Vaping Use: Never used  Substance and Sexual Activity   Alcohol use: Not Currently    Alcohol/week: 0.0 standard drinks    Comment: 01/24/2013 "drink or 2 once or /twice/year, if that"   Drug use: No   Sexual activity: Not Currently  Other Topics Concern   Not on file  Social History Narrative   No living will   Would want husband as POA--then sister, Neoma Laming   Would accept resuscitation attempts   Probably would not want tube feeds if cognitively unaware   Social Determinants of Health   Financial Resource Strain: Not on file  Food Insecurity: Not on file  Transportation Needs: Not on file  Physical Activity: Not on file  Stress: Not on file  Social Connections: Not on file  Intimate Partner Violence: Not on file    Outpatient Medications Prior to Visit  Medication Sig Dispense Refill   Accu-Chek Softclix Lancets lancets Use as instructed 100 each 5   albuterol (VENTOLIN HFA) 108 (90 Base) MCG/ACT  inhaler Inhale 2 puffs into the lungs every 6 (six) hours as needed for wheezing or shortness of breath. 18 g 1   CYMBALTA 30 MG capsule Take 3 capsules (90 mg total) by mouth daily. 270 capsule 2   glucose blood (ACCU-CHEK GUIDE) test strip Use as instructed 100 each 5   HYDROcodone-acetaminophen (NORCO) 7.5-325 MG tablet TAKE 1 TABLET Twice a Day. One in the Morning and one at Bedtime. 60 tablet 0   ibuprofen (ADVIL) 600 MG tablet TAKE 1 TABLET (600 MG TOTAL) BY MOUTH EVERY 8 (EIGHT) HOURS AS NEEDED FOR MILD PAIN. 90 tablet 5   loratadine (CLARITIN) 10 MG tablet Take 10 mg by mouth daily.     metFORMIN (GLUCOPHAGE-XR) 500 MG 24 hr tablet TAKE 2 TABLETS BY MOUTH EVERY DAY WITH BREAKFAST 180 tablet 3   metoCLOPramide (REGLAN) 10 MG tablet Take 10 mg by mouth 2 (two) times daily.     montelukast (SINGULAIR) 10 MG tablet TAKE 1 TABLET BY MOUTH EVERYDAY AT BEDTIME 90 tablet 3   olmesartan (BENICAR) 40 MG tablet TAKE 1 TABLET BY MOUTH EVERY DAY 90 tablet 3   omeprazole (PRILOSEC) 40 MG capsule Take 40 mg by mouth 2 (two) times daily.     SYMBICORT 160-4.5 MCG/ACT inhaler Inhale 2 puffs into the lungs daily. 1 each 11   tiZANidine (ZANAFLEX) 4 MG tablet TAKE 1 TABLET (4 MG TOTAL) BY MOUTH 4 (FOUR) TIMES DAILY. 120 tablet 3   topiramate (TOPAMAX) 100 MG tablet TAKE 1 TABLET BY MOUTH EVERY MORNING AND TAKE 2 TABLETS BY MOUTH EVERY EVENING 90 tablet 5   traMADol (ULTRAM) 50 MG tablet TAKE 1 TABLET BY MOUTH TWICE A DAY. DIRECTION CHANGE. 60 tablet 2   traMADol (ULTRAM-ER) 100 MG 24 hr tablet Take 1 tablet (100 mg total) by mouth daily. 30 tablet 2   traZODone (DESYREL) 50 MG tablet TAKE 1.5 TABLETS BY MOUTH AT BEDTIME 135 tablet 2   predniSONE (DELTASONE) 20 MG tablet Take 2 tablets (40 mg total) by mouth daily. 6 tablet 0   No facility-administered medications prior to visit.    Allergies  Allergen Reactions   Meperidine Anaphylaxis, Itching and Swelling    REACTION: swelling   Cymbalta [Duloxetine  Hcl] Hives    Generic makes her feel like she is crawling out of her skin   Diazepam Hives    REACTION: swelling   Other  Axe Cologne   Shellfish Allergy Hives    Review of Systems  Constitutional:  Negative for chills and fever.  Cardiovascular:  Negative for chest pain.  Gastrointestinal:  Negative for diarrhea, nausea and vomiting.  Skin:  Positive for rash.  Neurological:        Burning, stinging "+"      Objective:    Physical Exam Nursing note reviewed.  Constitutional:      Appearance: Normal appearance.  Cardiovascular:     Rate and Rhythm: Normal rate and regular rhythm.     Heart sounds: Normal heart sounds.  Pulmonary:     Effort: Pulmonary effort is normal.     Breath sounds: Normal breath sounds.  Abdominal:     General: Bowel sounds are normal.  Skin:    General: Skin is warm.     Findings: Rash present.     Comments: See clinical photo  Neurological:     Mental Status: She is alert.     BP 124/80    Pulse (!) 106    Temp (!) 97.3 F (36.3 C)    Resp 14    Ht 5' 7"  (1.702 m)    Wt 176 lb 7 oz (80 kg)    SpO2 95%    BMI 27.63 kg/m  Wt Readings from Last 3 Encounters:  12/25/21 176 lb 7 oz (80 kg)  12/17/21 174 lb (78.9 kg)  12/09/21 175 lb 6.4 oz (79.6 kg)    Health Maintenance Due  Topic Date Due   OPHTHALMOLOGY EXAM  Never done   HIV Screening  Never done   Zoster Vaccines- Shingrix (1 of 2) Never done    There are no preventive care reminders to display for this patient.   Lab Results  Component Value Date   TSH 1.93 02/28/2013   Lab Results  Component Value Date   WBC 10.3 06/19/2021   HGB 12.9 06/19/2021   HCT 40.4 06/19/2021   MCV 88.9 06/19/2021   PLT 248.0 06/19/2021   Lab Results  Component Value Date   NA 139 06/19/2021   K 4.0 06/19/2021   CO2 23 06/19/2021   GLUCOSE 181 (H) 06/19/2021   BUN 15 06/19/2021   CREATININE 0.82 06/19/2021   BILITOT 0.4 06/19/2021   ALKPHOS 104 06/19/2021   AST 25 06/19/2021   ALT  18 06/19/2021   PROT 7.0 06/19/2021   ALBUMIN 4.2 06/19/2021   ALBUMIN 4.2 06/19/2021   CALCIUM 9.3 06/19/2021   ANIONGAP 8 02/09/2021   GFR 79.50 06/19/2021   Lab Results  Component Value Date   CHOL 212 (H) 05/23/2020   Lab Results  Component Value Date   HDL 37.90 (L) 05/23/2020   Lab Results  Component Value Date   LDLCALC 92 02/28/2009   Lab Results  Component Value Date   TRIG 305.0 (H) 05/23/2020   Lab Results  Component Value Date   CHOLHDL 6 05/23/2020   Lab Results  Component Value Date   HGBA1C 7.3 (A) 10/29/2021       Assessment & Plan:   Problem List Items Addressed This Visit       Other   Herpes zoster without complication - Primary    Patient has rash and story consistent with herpes zoster.  Did discuss with patient antiviral treatment test results and start within 72 hours of rash onset.  We will treat patient.  She is having some herpetic neuralgia.  Did discuss with patient treatment options inclusive of  lidocaine over-the-counter.  Patient states she is already on pain medication that seems to be beneficial in regards to her pain.  Start valacyclovir 1 g 3 times daily for 7 days.  Follow-up if no improvement.      Relevant Medications   valACYclovir (VALTREX) 1000 MG tablet     Meds ordered this encounter  Medications   valACYclovir (VALTREX) 1000 MG tablet    Sig: Take 1 tablet (1,000 mg total) by mouth 3 (three) times daily for 7 days.    Dispense:  21 tablet    Refill:  0    Order Specific Question:   Supervising Provider    Answer:   Loura Pardon A [[id number]   This visit occurred during the SARS-CoV-2 public health emergency.  Safety protocols were in place, including screening questions prior to the visit, additional usage of staff PPE, and extensive cleaning of exam room while observing appropriate contact time as indicated for disinfecting solutions.   Romilda Garret, NP

## 2021-12-25 NOTE — Addendum Note (Signed)
Addended by: Bayard Hugger on: 12/25/2021 04:33 PM ? ? Modules accepted: Orders ? ?

## 2021-12-25 NOTE — Assessment & Plan Note (Signed)
Patient has rash and story consistent with herpes zoster.  Did discuss with patient antiviral treatment test results and start within 72 hours of rash onset.  We will treat patient.  She is having some herpetic neuralgia.  Did discuss with patient treatment options inclusive of lidocaine over-the-counter.  Patient states she is already on pain medication that seems to be beneficial in regards to her pain.  Start valacyclovir 1 g 3 times daily for 7 days.  Follow-up if no improvement. ?

## 2021-12-25 NOTE — Patient Instructions (Signed)
Nice to see you today ?Sent medication into your pharmacy ?Follow up if no improvement or symptoms worsen. ?You may have more lesions appear but looking at it I do not think so ?

## 2021-12-25 NOTE — Telephone Encounter (Signed)
Believes autistic granddaughter threw out pills was unable to take pill this morning  ?

## 2021-12-30 LAB — HM DIABETES EYE EXAM

## 2022-01-05 ENCOUNTER — Other Ambulatory Visit: Payer: Self-pay

## 2022-01-05 ENCOUNTER — Encounter: Payer: Worker's Compensation | Attending: Registered Nurse | Admitting: Registered Nurse

## 2022-01-05 VITALS — Ht 67.0 in | Wt 175.0 lb

## 2022-01-05 DIAGNOSIS — G894 Chronic pain syndrome: Secondary | ICD-10-CM | POA: Insufficient documentation

## 2022-01-05 DIAGNOSIS — M62838 Other muscle spasm: Secondary | ICD-10-CM | POA: Diagnosis present

## 2022-01-05 DIAGNOSIS — G90512 Complex regional pain syndrome I of left upper limb: Secondary | ICD-10-CM | POA: Diagnosis present

## 2022-01-05 DIAGNOSIS — G4700 Insomnia, unspecified: Secondary | ICD-10-CM | POA: Diagnosis not present

## 2022-01-05 DIAGNOSIS — Z5181 Encounter for therapeutic drug level monitoring: Secondary | ICD-10-CM | POA: Diagnosis present

## 2022-01-05 DIAGNOSIS — Z79891 Long term (current) use of opiate analgesic: Secondary | ICD-10-CM | POA: Insufficient documentation

## 2022-01-05 DIAGNOSIS — F3289 Other specified depressive episodes: Secondary | ICD-10-CM | POA: Diagnosis not present

## 2022-01-05 MED ORDER — TRAMADOL HCL ER 100 MG PO TB24: 100.0000 mg | ORAL_TABLET | Freq: Every day | ORAL | 2 refills | Status: DC

## 2022-01-05 MED ORDER — TRAZODONE HCL 50 MG PO TABS: ORAL_TABLET | ORAL | 2 refills | Status: DC

## 2022-01-05 MED ORDER — TRAMADOL HCL 50 MG PO TABS: ORAL_TABLET | ORAL | 2 refills | Status: DC

## 2022-01-05 MED ORDER — HYDROCODONE-ACETAMINOPHEN 7.5-325 MG PO TABS: ORAL_TABLET | ORAL | 0 refills | Status: DC

## 2022-01-05 NOTE — Progress Notes (Signed)
? ?Subjective:  ? ? Patient ID: [name], female    DOB: 19-Jan-1966, 56 y.o.   MRN: [id number] ? ?HPI: [name] is a 56 y.o. female who returns for follow up appointment for chronic pain and medication refill. She states her  pain is located in her  left wrist and left hand . She rates her pain 8. Her current exercise regime is walking and performing stretching exercises. ? ?[name] Morphine equivalent is 50.68 MME.   Last Oral Swab was Performed on 11/10/2021, it was consistent.  ?  ?Vitals: BP 144/82 P 97 and O2 Saturation 96% ?Pain Inventory ?Average Pain 7 ?Pain Right Now 8 ?My pain is sharp, burning, dull, stabbing, tingling, and aching ? ?In the last 24 hours, has pain interfered with the following? ?General activity 4 ?Relation with others 4 ?Enjoyment of life 4 ?What TIME of day is your pain at its worst? morning , daytime, evening, and night ?Sleep (in general) Poor ? ?Pain is worse with: walking, bending, sitting, inactivity, standing, unsure, and some activites ?Pain improves with: rest, heat/ice, therapy/exercise, medication, and TENS ?Relief from Meds: 6 ? ?Family History  ?Problem Relation Age of Onset  ? Cancer Mother   ?     lung  ? Hypertension Mother   ? Cancer Father   ?     colon  ? Hypertension Sister   ? Cancer Sister   ? Cancer Brother   ? Hyperlipidemia Sister   ? Birth defects Maternal Grandmother   ?     breast  ? Breast cancer Maternal Grandmother   ? Birth defects Paternal Grandmother   ?     uterine, stomach, lung  ? Breast cancer Paternal Grandmother   ? Breast cancer Paternal Aunt   ? ?Social History  ? ?Socioeconomic History  ? Marital status: Married  ?  Spouse name: Not on file  ? Number of children: 2  ? Years of education: Not on file  ? Highest education level: Not on file  ?Occupational History  ? 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 Smoking status: Never  ? Smokeless tobacco: Never  ?Vaping Use  ? Vaping Use: Never used   ?Substance and Sexual Activity  ? Alcohol use: Not Currently  ?  Alcohol/week: 0.0 standard drinks  ?  Comment: 01/24/2013 "drink or 2 once or /twice/year, if that"  ? Drug use: No  ? Sexual activity: Not Currently  ?Other Topics Concern  ? Not on file  ?Social History Narrative  ? No living will  ? Would want husband as POA--then sister, Neoma Laming  ? Would accept resuscitation attempts  ? Probably would not want tube feeds if cognitively unaware  ? ?Social Determinants of Health  ? ?Financial Resource Strain: Not on file  ?Food Insecurity: Not on file  ?Transportation Needs: Not on file  ?Physical Activity: Not on file  ?Stress: Not on file  ?Social Connections: Not on file  ? ?Past Surgical History:  ?Procedure Laterality Date  ? APPENDECTOMY  ~ 06/2007  ? BLADDER REPAIR  ~ 06/2007  ? "same day after bladder lift" (01/24/2013)  ? BLADDER SUSPENSION  ~ 06/2007  ? BREAST BIOPSY Left   ? DIAGNOSTIC LAPAROSCOPY  1990's & ~ 2000  ? "I've had a couple; for endometrosis" (01/24/2013)  ? HERNIA REPAIR    ? INCISIONAL HERNIA REPAIR N/A 01/24/2013  ? Procedure: LAPAROSCOPIC INCISIONAL HERNIA;  Surgeon: Harl Bowie, MD;  Location: [place];  Service:  General;  Laterality: N/A;  ? INSERTION OF MESH N/A 01/24/2013  ? Procedure: INSERTION OF MESH;  Surgeon: Harl Bowie, MD;  Location: [place];  Service: General;  Laterality: N/A;  ? LAPAROSCOPIC INCISIONAL / UMBILICAL / [place]  01/24/2013  ? IHR w/mesh/notes  ? NASAL SEPTUM SURGERY  1980's?  ? OOPHORECTOMY Right 2009  ? TONSILLECTOMY  1990's  ? VAGINAL HYSTERECTOMY  ` 06/2007  ? ?Past Surgical History:  ?Procedure Laterality Date  ? APPENDECTOMY  ~ 06/2007  ? BLADDER REPAIR  ~ 06/2007  ? "same day after bladder lift" (01/24/2013)  ? BLADDER SUSPENSION  ~ 06/2007  ? BREAST BIOPSY Left   ? DIAGNOSTIC LAPAROSCOPY  1990's & ~ 2000  ? "I've had a couple; for endometrosis" (01/24/2013)  ? HERNIA REPAIR    ? INCISIONAL HERNIA REPAIR N/A 01/24/2013  ? Procedure: LAPAROSCOPIC INCISIONAL  HERNIA;  Surgeon: Harl Bowie, MD;  Location: [place];  Service: General;  Laterality: N/A;  ? INSERTION OF MESH N/A 01/24/2013  ? Procedure: INSERTION OF MESH;  Surgeon: Harl Bowie, MD;  Location: [place];  Service: General;  Laterality: N/A;  ? LAPAROSCOPIC INCISIONAL / UMBILICAL / [place]  01/24/2013  ? IHR w/mesh/notes  ? NASAL SEPTUM SURGERY  1980's?  ? OOPHORECTOMY Right 2009  ? TONSILLECTOMY  1990's  ? VAGINAL HYSTERECTOMY  ` 06/2007  ? ?Past Medical History:  ?Diagnosis Date  ? Allergy   ? Anxiety   ? Arthritis   ? "just the norm" (01/24/2013)  ? ASCVD (arteriosclerotic cardiovascular disease)   ? Asthma   ? Celiac disease   ? Daily headache   ? "depends on the season" (01/24/2013)  ? Diabetes mellitus without complication ([place])   ? Fibromyalgia   ? GERD (gastroesophageal reflux disease)   ? History of colonic polyps   ? Hyperlipidemia   ? Hypertension   ? Migraines   ? Obesity (BMI 30.0-34.9)   ? RSD (reflex sympathetic dystrophy)   ? Sleep apnea   ? NO MACHINE RECOMMENDED  ? TIA (transient ischemic attack) ~ 2009  ? ?There were no vitals taken for this visit. ? ?Opioid Risk Score:   ?Fall Risk Score:  `1 ? ?Depression screen PHQ 2/9 ? ?Depression screen [facility] 2/9 12/09/2021 11/10/2021 10/06/2021 08/18/2021 06/12/2021 05/12/2021 04/14/2021  ?Decreased Interest 1 1 1 1 1 1 1   ?Down, Depressed, Hopeless 1 1 1 1 1 1 1   ?PHQ - 2 Score 2 2 2 2 2 2 2   ?Altered sleeping - - - 3 - - -  ?Tired, decreased energy - - - 3 - - -  ?Change in appetite - - - 1 - - -  ?Feeling bad or failure about yourself  - - - 1 - - -  ?Trouble concentrating - - - 1 - - -  ?Moving slowly or fidgety/restless - - - 1 - - -  ?Suicidal thoughts - - - 0 - - -  ?PHQ-9 Score - - - 12 - - -  ?Difficult doing work/chores - - - Somewhat difficult - - -  ?Some recent data might be hidden  ?  ? ?Review of Systems  ?Musculoskeletal:   ?     Left arm pain  ?All other systems reviewed and are negative. ? ?   ?Objective:  ? Physical Exam ?Vitals  and nursing note reviewed.  ?Constitutional:   ?   Appearance: Normal appearance.  ?Cardiovascular:  ?   Rate  and Rhythm: Normal rate and regular rhythm.  ?   Pulses: Normal pulses.  ?   Heart sounds: Normal heart sounds.  ?Pulmonary:  ?   Effort: Pulmonary effort is normal.  ?   Breath sounds: Normal breath sounds.  ?Musculoskeletal:  ?   Cervical back: Normal range of motion and neck supple.  ?   Comments: Normal Muscle Bulk and Muscle Testing Reveals:  ?Upper Extremities: Right: Full ROM and Muscle Strength  4/5 ?Left Upper Extremity: Decreased ROM 45 Degrees and Muscle Strength 3/5 ?Thoracic Hypersensitivity: T-4-T-6 Mainly Left Side ? Lower Extremities: Decreased ROM and Muscle Strength 4/5 ?Bilateral Lower Extremities Flexion Produces Pain into her Lower Extremities and Bilateral Feet ?Arises from Table Slowly ?Antalgic  Gait  ?   ?Skin: ?   General: Skin is warm and dry.  ?Neurological:  ?   Mental Status: She is alert and oriented to person, place, and time.  ?Psychiatric:     ?   Mood and Affect: Mood normal.     ?   Behavior: Behavior normal.  ? ? ? ? ?   ?Assessment & Plan:  ?1.Complex Regional Pain Syndrome Type 1: Continue Topamax and Cymbalta. 01/05/2022. ?Refilled: Hydro-codone 7.5/325mg one tablet twice a day one in the morning and one at bedtime #60. Continue Tramadol 100 mg ER daily #30 and Tramadol 50 mg one tablet by mouth BID. One tablet in the afternoon and one tablet in the evening. ?We will continue the opioid monitoring program, this consists of regular clinic visits, examinations, urine drug screen, pill counts as well as use of [place] Controlled Substance Reporting system. A 12 month History has been reviewed on the [place] Controlled Substance Reporting System  On 01/05/2022. ?2. Depression: Continue current medication regimen with Cymbalta.01/05/2022 ?3. Insomnia: Continue current medication regimen with  Trazodone one and half tablet at bedtime  01/05/2022 ?4. Muscle  Spasms: Continue current medication regimen with Tizanidine. 01/05/2022 ?5. Chronic Pain Syndrome: Continue Ibuprofen BID as needed. 01/05/2022 ?  ?F/U in 1 month ?  ? ? ? ? ? ? ? ?

## 2022-01-08 ENCOUNTER — Encounter: Payer: Self-pay | Admitting: Primary Care

## 2022-01-12 ENCOUNTER — Encounter: Payer: Self-pay | Admitting: Registered Nurse

## 2022-01-29 ENCOUNTER — Telehealth: Payer: Self-pay | Admitting: Registered Nurse

## 2022-01-29 ENCOUNTER — Other Ambulatory Visit: Payer: Self-pay | Admitting: Registered Nurse

## 2022-01-29 MED ORDER — HYDROCODONE-ACETAMINOPHEN 7.5-325 MG PO TABS: ORAL_TABLET | ORAL | 0 refills | Status: DC

## 2022-01-29 NOTE — Telephone Encounter (Signed)
PMP Reviewed.  ?Placed a call to [name], scheduled changed, she verbalizes understanding.  ?Hydrocodone e-scribed to accommodate scheduled appointment  ?

## 2022-02-01 ENCOUNTER — Encounter: Payer: Worker's Compensation | Admitting: Registered Nurse

## 2022-02-04 ENCOUNTER — Telehealth: Payer: Self-pay | Admitting: Registered Nurse

## 2022-02-04 MED ORDER — HYDROCODONE-ACETAMINOPHEN 10-325 MG PO TABS
1.0000 | ORAL_TABLET | Freq: Two times a day (BID) | ORAL | 0 refills | Status: DC | PRN
Start: 2022-02-04 — End: 2022-02-11

## 2022-02-04 NOTE — Telephone Encounter (Signed)
Patient would like to speak to you about her medication ?

## 2022-02-04 NOTE — Telephone Encounter (Signed)
PMP was Reviewed.  ?Hydrocodone 7.5 mg/325 on back order.  ?Hydrocodone 10 mg/325 e-scribed today.  ?Placed a call to [name] regarding the above, she verbalizes understanding.  ?

## 2022-02-11 ENCOUNTER — Encounter: Payer: Worker's Compensation | Attending: Registered Nurse | Admitting: Registered Nurse

## 2022-02-11 ENCOUNTER — Encounter: Payer: Self-pay | Admitting: Registered Nurse

## 2022-02-11 VITALS — BP 135/86 | HR 103 | Ht 67.0 in | Wt 178.6 lb

## 2022-02-11 DIAGNOSIS — M62838 Other muscle spasm: Secondary | ICD-10-CM

## 2022-02-11 DIAGNOSIS — G4700 Insomnia, unspecified: Secondary | ICD-10-CM

## 2022-02-11 DIAGNOSIS — G90512 Complex regional pain syndrome I of left upper limb: Secondary | ICD-10-CM

## 2022-02-11 DIAGNOSIS — Z5181 Encounter for therapeutic drug level monitoring: Secondary | ICD-10-CM

## 2022-02-11 DIAGNOSIS — F3289 Other specified depressive episodes: Secondary | ICD-10-CM

## 2022-02-11 DIAGNOSIS — Z79891 Long term (current) use of opiate analgesic: Secondary | ICD-10-CM

## 2022-02-11 DIAGNOSIS — G894 Chronic pain syndrome: Secondary | ICD-10-CM

## 2022-02-11 MED ORDER — HYDROCODONE-ACETAMINOPHEN 10-325 MG PO TABS: 1.0000 | ORAL_TABLET | Freq: Two times a day (BID) | ORAL | 0 refills | Status: DC | PRN

## 2022-02-11 NOTE — Progress Notes (Signed)
? ?Subjective:  ? ? Patient ID: [name], female    DOB: 1966-09-04, 56 y.o.   MRN: [id number] ? ?HPI: [name] is a 56 y.o. female who returns for follow up appointment for chronic pain and medication refill. She states her  pain is located in her left arm and left wrist.. She rates her pain 9. Her current exercise regime is walking  ? ?[name] Morphine equivalent is 40.00 MME.   Last Oral Swab was Performed 11/10/2021, it was consistent.  ?  ? ?Pain Inventory ?Average Pain 7 ?Pain Right Now 9 ?My pain is constant, sharp, burning, stabbing, tingling, aching, and throbbing, "bone crunching" ? ?In the last 24 hours, has pain interfered with the following? ?General activity 5 ?Relation with others 5 ?Enjoyment of life 5 ?What TIME of day is your pain at its worst? morning , daytime, evening, and night ?Sleep (in general) Poor ? ?Pain is worse with: walking, bending, standing, some activites, and anything ?Pain improves with: rest, heat/ice, pacing activities, medication, and TENS ?Relief from Meds: 7 ? ?Family History  ?Problem Relation Age of Onset  ? Cancer Mother   ?     lung  ? Hypertension Mother   ? Cancer Father   ?     colon  ? Hypertension Sister   ? Cancer Sister   ? Cancer Brother   ? Hyperlipidemia Sister   ? Birth defects Maternal Grandmother   ?     breast  ? Breast cancer Maternal Grandmother   ? Birth defects Paternal Grandmother   ?     uterine, stomach, lung  ? Breast cancer Paternal Grandmother   ? Breast cancer Paternal Aunt   ? ?Social History  ? ?Socioeconomic History  ? Marital status: Married  ?  Spouse name: Not on file  ? Number of children: 2  ? Years of education: Not on file  ? Highest education level: Not on file  ?Occupational History  ? 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 Smoking status: Never  ? Smokeless tobacco: Never  ?Vaping Use  ? Vaping Use: Never used  ?Substance and Sexual Activity  ? Alcohol use: Not Currently  ?   Alcohol/week: 0.0 standard drinks  ?  Comment: 01/24/2013 "drink or 2 once or /twice/year, if that"  ? Drug use: No  ? Sexual activity: Not Currently  ?Other Topics Concern  ? Not on file  ?Social History Narrative  ? No living will  ? Would want husband as POA--then sister, Neoma Laming  ? Would accept resuscitation attempts  ? Probably would not want tube feeds if cognitively unaware  ? ?Social Determinants of Health  ? ?Financial Resource Strain: Not on file  ?Food Insecurity: Not on file  ?Transportation Needs: Not on file  ?Physical Activity: Not on file  ?Stress: Not on file  ?Social Connections: Not on file  ? ?Past Surgical History:  ?Procedure Laterality Date  ? APPENDECTOMY  ~ 06/2007  ? BLADDER REPAIR  ~ 06/2007  ? "same day after bladder lift" (01/24/2013)  ? BLADDER SUSPENSION  ~ 06/2007  ? BREAST BIOPSY Left   ? DIAGNOSTIC LAPAROSCOPY  1990's & ~ 2000  ? "I've had a couple; for endometrosis" (01/24/2013)  ? HERNIA REPAIR    ? INCISIONAL HERNIA REPAIR N/A 01/24/2013  ? Procedure: LAPAROSCOPIC INCISIONAL HERNIA;  Surgeon: Harl Bowie, MD;  Location: [place];  Service: General;  Laterality: N/A;  ? INSERTION OF MESH N/A 01/24/2013  ?  Procedure: INSERTION OF MESH;  Surgeon: Harl Bowie, MD;  Location: [place];  Service: General;  Laterality: N/A;  ? LAPAROSCOPIC INCISIONAL / UMBILICAL / [place]  01/24/2013  ? IHR w/mesh/notes  ? NASAL SEPTUM SURGERY  1980's?  ? OOPHORECTOMY Right 2009  ? TONSILLECTOMY  1990's  ? VAGINAL HYSTERECTOMY  ` 06/2007  ? ?Past Surgical History:  ?Procedure Laterality Date  ? APPENDECTOMY  ~ 06/2007  ? BLADDER REPAIR  ~ 06/2007  ? "same day after bladder lift" (01/24/2013)  ? BLADDER SUSPENSION  ~ 06/2007  ? BREAST BIOPSY Left   ? DIAGNOSTIC LAPAROSCOPY  1990's & ~ 2000  ? "I've had a couple; for endometrosis" (01/24/2013)  ? HERNIA REPAIR    ? INCISIONAL HERNIA REPAIR N/A 01/24/2013  ? Procedure: LAPAROSCOPIC INCISIONAL HERNIA;  Surgeon: Harl Bowie, MD;  Location: [place];   Service: General;  Laterality: N/A;  ? INSERTION OF MESH N/A 01/24/2013  ? Procedure: INSERTION OF MESH;  Surgeon: Harl Bowie, MD;  Location: [place];  Service: General;  Laterality: N/A;  ? LAPAROSCOPIC INCISIONAL / UMBILICAL / [place]  01/24/2013  ? IHR w/mesh/notes  ? NASAL SEPTUM SURGERY  1980's?  ? OOPHORECTOMY Right 2009  ? TONSILLECTOMY  1990's  ? VAGINAL HYSTERECTOMY  ` 06/2007  ? ?Past Medical History:  ?Diagnosis Date  ? Allergy   ? Anxiety   ? Arthritis   ? "just the norm" (01/24/2013)  ? ASCVD (arteriosclerotic cardiovascular disease)   ? Asthma   ? Celiac disease   ? Daily headache   ? "depends on the season" (01/24/2013)  ? Diabetes mellitus without complication ([place])   ? Fibromyalgia   ? GERD (gastroesophageal reflux disease)   ? History of colonic polyps   ? Hyperlipidemia   ? Hypertension   ? Migraines   ? Obesity (BMI 30.0-34.9)   ? RSD (reflex sympathetic dystrophy)   ? Sleep apnea   ? NO MACHINE RECOMMENDED  ? TIA (transient ischemic attack) ~ 2009  ? ?BP 135/86   Pulse (!) 103   Ht 5' 7"  (1.702 m)   Wt 178 lb 9.6 oz (81 kg)   SpO2 95%   BMI 27.97 kg/m?  ? ?Opioid Risk Score:   ?Fall Risk Score:  `1 ? ?Depression screen PHQ 2/9 ? ? ?  02/11/2022  ?  1:36 PM 12/09/2021  ?  1:22 PM 11/10/2021  ?  1:28 PM 10/06/2021  ?  1:15 PM 08/18/2021  ?  1:27 PM 06/12/2021  ? 12:52 PM 05/12/2021  ?  2:04 PM  ?Depression screen PHQ 2/9  ?Decreased Interest 0 1 1 1 1 1 1   ?Down, Depressed, Hopeless 0 1 1 1 1 1 1   ?PHQ - 2 Score 0 2 2 2 2 2 2   ?Altered sleeping     3    ?Tired, decreased energy     3    ?Change in appetite     1    ?Feeling bad or failure about yourself      1    ?Trouble concentrating     1    ?Moving slowly or fidgety/restless     1    ?Suicidal thoughts     0    ?PHQ-9 Score     12    ?Difficult doing work/chores     Somewhat difficult    ?  ? ?Review of Systems  ?Constitutional: Negative.   ?HENT: Negative.    ?  Eyes: Negative.   ?Respiratory: Negative.    ?Cardiovascular: Negative.    ?Gastrointestinal: Negative.   ?Endocrine: Negative.   ?Genitourinary: Negative.   ?Musculoskeletal: Negative.   ?Skin: Negative.   ?Allergic/Immunologic: Negative.   ?Neurological:  Positive for weakness.  ?Hematological: Negative.   ?Psychiatric/Behavioral:  Positive for sleep disturbance.   ? ?   ?Objective:  ? Physical Exam ?Vitals and nursing note reviewed.  ?Constitutional:   ?   Appearance: Normal appearance.  ?Neck:  ?   Comments: Cervical Paraspinal Tenderness: C-5-C-6 ?Cardiovascular:  ?   Rate and Rhythm: Normal rate and regular rhythm.  ?   Pulses: Normal pulses.  ?   Heart sounds: Normal heart sounds.  ?Pulmonary:  ?   Effort: Pulmonary effort is normal.  ?   Breath sounds: Normal breath sounds.  ?Musculoskeletal:  ?   Cervical back: Normal range of motion and neck supple.  ?   Comments: Normal Muscle Bulk and Muscle Testing Reveals: ? Upper Extremities: Right: Full ROM and Muscle Strength 5/5 ?Lower Extremity: Decreased ROM 90 Degrees and Muscle Strength 3/5 ?Bilateral AC Joint Tenderness ?Thoracic Paraspinal Tenderness: T-3-T-7 Mainly Left Side ? Lower Extremities : Decreased ROM and Muscle Strength 4/5 ?Bilateral Lower Extremities Flexion Produces Pain into her lower extremities and bilateral feet ?Arises from Table slowly ?Antalgic Gait  ?   ?Skin: ?   General: Skin is warm and dry.  ?Neurological:  ?   Mental Status: She is alert and oriented to person, place, and time.  ?Psychiatric:     ?   Mood and Affect: Mood normal.     ?   Behavior: Behavior normal.  ? ? ? ? ?   ?Assessment & Plan:  ?1.Complex Regional Pain Syndrome Type 1: Continue Topamax and Cymbalta. 02/11/2022. ?Refilled: Hydro-codone 7.5/325mg one tablet twice a day one in the morning and one at bedtime #60. Continue Tramadol 100 mg ER daily #30 and Tramadol 50 mg one tablet by mouth BID. One tablet in the afternoon and one tablet in the evening. ?We will continue the opioid monitoring program, this consists of regular clinic visits,  examinations, urine drug screen, pill counts as well as use of [place] Controlled Substance Reporting system. A 12 month History has been reviewed on the [place] Controlled Substance Reporting System  On 04/

## 2022-03-03 DIAGNOSIS — J3489 Other specified disorders of nose and nasal sinuses: Secondary | ICD-10-CM | POA: Diagnosis not present

## 2022-03-03 DIAGNOSIS — H93291 Other abnormal auditory perceptions, right ear: Secondary | ICD-10-CM | POA: Diagnosis not present

## 2022-03-03 DIAGNOSIS — Z9089 Acquired absence of other organs: Secondary | ICD-10-CM | POA: Diagnosis not present

## 2022-03-03 DIAGNOSIS — K219 Gastro-esophageal reflux disease without esophagitis: Secondary | ICD-10-CM | POA: Diagnosis not present

## 2022-03-03 DIAGNOSIS — J343 Hypertrophy of nasal turbinates: Secondary | ICD-10-CM | POA: Diagnosis not present

## 2022-03-03 DIAGNOSIS — Z9889 Other specified postprocedural states: Secondary | ICD-10-CM | POA: Diagnosis not present

## 2022-03-05 DIAGNOSIS — H903 Sensorineural hearing loss, bilateral: Secondary | ICD-10-CM | POA: Diagnosis not present

## 2022-03-17 ENCOUNTER — Encounter: Payer: Worker's Compensation | Attending: Registered Nurse | Admitting: Registered Nurse

## 2022-03-17 ENCOUNTER — Encounter: Payer: Self-pay | Admitting: Registered Nurse

## 2022-03-17 VITALS — BP 135/86 | HR 103 | Ht 67.0 in | Wt 178.6 lb

## 2022-03-17 DIAGNOSIS — G4700 Insomnia, unspecified: Secondary | ICD-10-CM | POA: Insufficient documentation

## 2022-03-17 DIAGNOSIS — G894 Chronic pain syndrome: Secondary | ICD-10-CM | POA: Diagnosis not present

## 2022-03-17 DIAGNOSIS — M62838 Other muscle spasm: Secondary | ICD-10-CM | POA: Insufficient documentation

## 2022-03-17 DIAGNOSIS — Z79891 Long term (current) use of opiate analgesic: Secondary | ICD-10-CM | POA: Insufficient documentation

## 2022-03-17 DIAGNOSIS — G90512 Complex regional pain syndrome I of left upper limb: Secondary | ICD-10-CM | POA: Diagnosis not present

## 2022-03-17 DIAGNOSIS — F3289 Other specified depressive episodes: Secondary | ICD-10-CM | POA: Diagnosis not present

## 2022-03-17 DIAGNOSIS — Z5181 Encounter for therapeutic drug level monitoring: Secondary | ICD-10-CM | POA: Insufficient documentation

## 2022-03-17 MED ORDER — TRAMADOL HCL ER 100 MG PO TB24: 100.0000 mg | ORAL_TABLET | Freq: Every day | ORAL | 2 refills | Status: DC

## 2022-03-17 MED ORDER — TRAZODONE HCL 50 MG PO TABS: ORAL_TABLET | ORAL | 2 refills | Status: DC

## 2022-03-17 MED ORDER — TIZANIDINE HCL 4 MG PO TABS
ORAL_TABLET | ORAL | 3 refills | Status: DC
Start: 2022-03-17 — End: 2022-06-01

## 2022-03-17 MED ORDER — TRAMADOL HCL 50 MG PO TABS: ORAL_TABLET | ORAL | 2 refills | Status: DC

## 2022-03-17 MED ORDER — HYDROCODONE-ACETAMINOPHEN 10-325 MG PO TABS: 1.0000 | ORAL_TABLET | Freq: Two times a day (BID) | ORAL | 0 refills | Status: DC | PRN

## 2022-03-17 NOTE — Progress Notes (Signed)
Subjective:    Patient ID: [name], female    DOB: [birth year], 56 y.o.   MRN: [id number]  HPI: [name] is a 56 y.o. female who called the office asking for a My-Chart VideooVisit, she was exposed to COVID-19 by her Granddaughter.Her appointment was changed to My- Chart Visit , we have discussed the limitations of evaluation and management by telemedicine and the availability of in person appointments. The patient expressed understanding and agreed to proceed She  states her pain is located in  her left arm and left wrist. She rates her pain 7. Her current exercise regime is walking and performing stretching exercises.  [name] Morphine equivalent is 40.00 MME.   Last Oral Swab was Performed oin 11/10/2021, it was consistent.     Pain Inventory Average Pain 7 Pain Right Now 7 My pain is constant, sharp, burning, dull, stabbing, tingling, and aching  In the last 24 hours, has pain interfered with the following? General activity  varies with day Relation with others varies with day Enjoyment of life varies with day What TIME of day is your pain at its worst? varies Sleep (in general) Poor  Pain is worse with: walking, bending, sitting, inactivity, standing, and some activites Pain improves with: rest, pacing activities, and medication Relief from Meds: 5  Family History  Problem Relation Age of Onset   Cancer Mother        lung   Hypertension Mother    Cancer Father        colon   Hypertension Sister    Cancer Sister    Cancer Brother    Hyperlipidemia Sister    Birth defects Maternal Grandmother        breast   Breast cancer Maternal Grandmother    Birth defects Paternal Grandmother        uterine, stomach, lung   Breast cancer Paternal Grandmother    Breast cancer Paternal Aunt    Social History   Socioeconomic History   Marital status: Married    Spouse name: Not on file   Number of children: 2   Years of education: Not on  file   Highest education level: Not on file  Occupational History   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  Tobacco Use   Smoking status: Never   Smokeless tobacco: Never  Vaping Use   Vaping Use: Never used  Substance and Sexual Activity   Alcohol use: Not Currently    Alcohol/week: 0.0 standard drinks    Comment: 01/24/2013 "drink or 2 once or /twice/year, if that"   Drug use: No   Sexual activity: Not Currently  Other Topics Concern   Not on file  Social History Narrative   No living will   Would want husband as POA--then sister, Neoma Laming   Would accept resuscitation attempts   Probably would not want tube feeds if cognitively unaware   Social Determinants of Health   Financial Resource Strain: Not on file  Food Insecurity: Not on file  Transportation Needs: Not on file  Physical Activity: Not on file  Stress: Not on file  Social Connections: Not on file   Past Surgical History:  Procedure Laterality Date   APPENDECTOMY  ~ 06/2007   BLADDER REPAIR  ~ 06/2007   "same day after bladder lift" (01/24/2013)   BLADDER SUSPENSION  ~ 06/2007   BREAST BIOPSY Left    DIAGNOSTIC LAPAROSCOPY  1990's & ~ 2000   "I've had a couple; for endometrosis" (01/24/2013)  HERNIA REPAIR     INCISIONAL HERNIA REPAIR N/A 01/24/2013   Procedure: LAPAROSCOPIC INCISIONAL HERNIA;  Surgeon: Harl Bowie, MD;  Location: [place];  Service: General;  Laterality: N/A;   INSERTION OF MESH N/A 01/24/2013   Procedure: INSERTION OF MESH;  Surgeon: Harl Bowie, MD;  Location: [place];  Service: General;  Laterality: N/A;   LAPAROSCOPIC INCISIONAL / UMBILICAL / [place]  01/24/2013   IHR w/mesh/notes   NASAL SEPTUM SURGERY  1980's?   OOPHORECTOMY Right 2009   TONSILLECTOMY  1990's   VAGINAL HYSTERECTOMY  ` 06/2007   Past Surgical History:  Procedure Laterality Date   APPENDECTOMY  ~ 06/2007   BLADDER REPAIR  ~ 06/2007   "same day after bladder lift" (01/24/2013)   BLADDER SUSPENSION  ~ 06/2007    BREAST BIOPSY Left    DIAGNOSTIC LAPAROSCOPY  1990's & ~ 2000   "I've had a couple; for endometrosis" (01/24/2013)   [place] N/A 01/24/2013   Procedure: LAPAROSCOPIC INCISIONAL HERNIA;  Surgeon: Harl Bowie, MD;  Location: [facility];  Service: General;  Laterality: N/A;   INSERTION OF MESH N/A 01/24/2013   Procedure: INSERTION OF MESH;  Surgeon: Harl Bowie, MD;  Location: [facility];  Service: General;  Laterality: N/A;   LAPAROSCOPIC INCISIONAL / UMBILICAL / [facility]  01/24/2013   IHR w/mesh/notes   NASAL SEPTUM SURGERY  1980's?   OOPHORECTOMY Right 2009   TONSILLECTOMY  1990's   VAGINAL HYSTERECTOMY  ` 06/2007   Past Medical History:  Diagnosis Date   Allergy    Anxiety    Arthritis    "just the norm" (01/24/2013)   ASCVD (arteriosclerotic cardiovascular disease)    Asthma    Celiac disease    Daily headache    "depends on the season" (01/24/2013)   Diabetes mellitus without complication (HCC)    Fibromyalgia    GERD (gastroesophageal reflux disease)    History of colonic polyps    Hyperlipidemia    Hypertension    Migraines    Obesity (BMI 30.0-34.9)    RSD (reflex sympathetic dystrophy)    Sleep apnea    NO MACHINE RECOMMENDED   TIA (transient ischemic attack) ~ 2009   BP 135/86 Comment: last record  Pulse (!) 103 Comment: last recorded  Ht 5' 7"  (1.702 m) Comment: last recorded  Wt 178 lb 9.6 oz (81 kg) Comment: last recorded  BMI 27.97 kg/m   Opioid Risk Score:   Fall Risk Score:  `1  Depression screen PHQ 2/9     02/11/2022    1:36 PM 12/09/2021    1:22 PM 11/10/2021    1:28 PM 10/06/2021    1:15 PM 08/18/2021    1:27 PM 06/12/2021   12:52 PM 05/12/2021    2:04 PM  Depression screen PHQ 2/9  Decreased Interest 0 1 1 1 1 1 1   Down, Depressed, Hopeless 0 1 1 1 1 1 1   PHQ - 2 Score 0 2 2 2 2 2 2   Altered sleeping     3    Tired, decreased energy     3    Change in appetite     1    Feeling bad or failure about  yourself      1    Trouble concentrating     1    Moving slowly or fidgety/restless     1  Suicidal thoughts     0    PHQ-9 Score     12    Difficult doing work/chores     Somewhat difficult       Review of Systems  Constitutional: Negative.   HENT: Negative.    Eyes: Negative.   Respiratory: Negative.    Cardiovascular: Negative.   Gastrointestinal: Negative.   Endocrine: Negative.   Genitourinary: Negative.   Musculoskeletal:  Positive for back pain.  Skin: Negative.   Allergic/Immunologic: Negative.   Neurological: Negative.   Hematological: Negative.   Psychiatric/Behavioral:  Positive for dysphoric mood.   All other systems reviewed and are negative.     Objective:   Physical Exam Vitals and nursing note reviewed.  Musculoskeletal:     Comments: No Physical Exam: My Chart Video Visit          Assessment & Plan:  1.Complex Regional Pain Syndrome Type 1: Continue Topamax and Cymbalta. 03/17/2022. Refilled: Hydro-codone 7.5/325mg one tablet twice a day one in the morning and one at bedtime #60. Continue Tramadol 100 mg ER daily #30 and Tramadol 50 mg one tablet by mouth BID. One tablet in the afternoon and one tablet in the evening. We will continue the opioid monitoring program, this consists of regular clinic visits, examinations, urine drug screen, pill counts as well as use of [place] Controlled Substance Reporting system. A 12 month History has been reviewed on the [place] Controlled Substance Reporting System  On 03/17/2022. 2. Depression: Continue current medication regimen with Cymbalta.03/17/2022 3. Insomnia: Continue current medication regimen with  Trazodone one and half tablet at bedtime  03/17/2022 4. Muscle Spasms: Continue current medication regimen with Tizanidine. 03/17/2022 5. Chronic Pain Syndrome: Continue Ibuprofen BID as needed. 03/17/2022   F/U in 1 month My: Chart Video Visit Established Patient Location of Patient: In her  Home Location of Provider: In the Office

## 2022-03-23 ENCOUNTER — Telehealth: Payer: Self-pay | Admitting: Internal Medicine

## 2022-03-23 NOTE — Telephone Encounter (Signed)
[name] Left with [facility] called and said that since pt is diabetic they wanted to know if pt records can be evaluated to see if she should be on a statin so she can be at less risk of heart attack or stroke.

## 2022-03-25 NOTE — Telephone Encounter (Signed)
Scheduled for 04/01/22, if not needed or if change needed please contact pt

## 2022-04-01 ENCOUNTER — Ambulatory Visit (INDEPENDENT_AMBULATORY_CARE_PROVIDER_SITE_OTHER): Payer: Medicare HMO | Admitting: Internal Medicine

## 2022-04-01 ENCOUNTER — Encounter: Payer: Self-pay | Admitting: Internal Medicine

## 2022-04-01 DIAGNOSIS — E1151 Type 2 diabetes mellitus with diabetic peripheral angiopathy without gangrene: Secondary | ICD-10-CM

## 2022-04-01 MED ORDER — ROSUVASTATIN CALCIUM 5 MG PO TABS: 5.0000 mg | ORAL_TABLET | Freq: Every day | ORAL | 3 refills | Status: DC

## 2022-04-01 NOTE — Assessment & Plan Note (Signed)
Still doing well with control Discussed starting statin therapy She would like to She also reminds me of a TIA about 12 years ago Will try crestor 57m daily Discussed potential side effects

## 2022-04-01 NOTE — Progress Notes (Signed)
Subjective:    Patient ID: [name], female    DOB: April 09, 1966, 56 y.o.   MRN: [id number]  HPI Here to discuss statin Rx   Called by her insurance company---no statin despite DM diagnosis (fairly recent) Discussed the current recommendations and she wants to do whatever is right Last LDL was 92  Current Outpatient Medications on File Prior to Visit  Medication Sig Dispense Refill   Accu-Chek Softclix Lancets lancets Use as instructed 100 each 5   albuterol (VENTOLIN HFA) 108 (90 Base) MCG/ACT inhaler Inhale 2 puffs into the lungs every 6 (six) hours as needed for wheezing or shortness of breath. 18 g 1   CYMBALTA 30 MG capsule Take 3 capsules (90 mg total) by mouth daily. 270 capsule 2   glucose blood (ACCU-CHEK GUIDE) test strip Use as instructed 100 each 5   HYDROcodone-acetaminophen (NORCO) 10-325 MG tablet Take 1 tablet by mouth 2 (two) times daily as needed. Do Not Fill Before 04/02/2022 60 tablet 0   ibuprofen (ADVIL) 600 MG tablet TAKE 1 TABLET (600 MG TOTAL) BY MOUTH EVERY 8 (EIGHT) HOURS AS NEEDED FOR MILD PAIN. 90 tablet 5   loratadine (CLARITIN) 10 MG tablet Take 10 mg by mouth daily.     metFORMIN (GLUCOPHAGE-XR) 500 MG 24 hr tablet TAKE 2 TABLETS BY MOUTH EVERY DAY WITH BREAKFAST 180 tablet 3   metoCLOPramide (REGLAN) 10 MG tablet Take 10 mg by mouth 2 (two) times daily.     montelukast (SINGULAIR) 10 MG tablet TAKE 1 TABLET BY MOUTH EVERYDAY AT BEDTIME 90 tablet 3   olmesartan (BENICAR) 40 MG tablet TAKE 1 TABLET BY MOUTH EVERY DAY 90 tablet 3   omeprazole (PRILOSEC) 40 MG capsule Take 40 mg by mouth 2 (two) times daily.     SYMBICORT 160-4.5 MCG/ACT inhaler Inhale 2 puffs into the lungs daily. 1 each 11   tiZANidine (ZANAFLEX) 4 MG tablet TAKE 1 TABLET (4 MG TOTAL) BY MOUTH 4 (FOUR) TIMES DAILY. 120 tablet 3   topiramate (TOPAMAX) 100 MG tablet TAKE 1 TABLET BY MOUTH EVERY MORNING AND TAKE 2 TABLETS BY MOUTH EVERY EVENING 90 tablet 5   traMADol (ULTRAM) 50 MG  tablet TAKE 1 TABLET BY MOUTH TWICE A DAY. DIRECTION CHANGE. 60 tablet 2   traMADol (ULTRAM-ER) 100 MG 24 hr tablet Take 1 tablet (100 mg total) by mouth daily. 30 tablet 2   traZODone (DESYREL) 50 MG tablet TAKE 1.5 TABLETS BY MOUTH AT BEDTIME 135 tablet 2   [DISCONTINUED] losartan (COZAAR) 100 MG tablet TAKE 1 TABLET BY MOUTH EVERY DAY 90 tablet 0   No current facility-administered medications on file prior to visit.    Allergies  Allergen Reactions   Meperidine Anaphylaxis, Itching and Swelling    REACTION: swelling   Cymbalta [Duloxetine Hcl] Hives    Generic makes her feel like she is crawling out of her skin   Diazepam Hives    REACTION: swelling   Other     Axe Cologne   Shellfish Allergy Hives    Past Medical History:  Diagnosis Date   Allergy    Anxiety    Arthritis    "just the norm" (01/24/2013)   ASCVD (arteriosclerotic cardiovascular disease)    Asthma    Celiac disease    Daily headache    "depends on the season" (01/24/2013)   Diabetes mellitus without complication (HCC)    Fibromyalgia    GERD (gastroesophageal reflux disease)    History of colonic polyps  Hyperlipidemia    Hypertension    Migraines    Obesity (BMI 30.0-34.9)    RSD (reflex sympathetic dystrophy)    Sleep apnea    NO MACHINE RECOMMENDED   TIA (transient ischemic attack) ~ 2009    Past Surgical History:  Procedure Laterality Date   APPENDECTOMY  ~ 06/2007   BLADDER REPAIR  ~ 06/2007   "same day after bladder lift" (01/24/2013)   BLADDER SUSPENSION  ~ 06/2007   BREAST BIOPSY Left    DIAGNOSTIC LAPAROSCOPY  1990's & ~ 2000   "I've had a couple; for endometrosis" (01/24/2013)   [place] N/A 01/24/2013   Procedure: LAPAROSCOPIC INCISIONAL HERNIA;  Surgeon: Harl Bowie, MD;  Location: [place];  Service: General;  Laterality: N/A;   INSERTION OF MESH N/A 01/24/2013   Procedure: INSERTION OF MESH;  Surgeon: Harl Bowie, MD;  Location: [place] [facility];  Service:  General;  Laterality: N/A;   LAPAROSCOPIC INCISIONAL / UMBILICAL / [place]  01/24/2013   IHR w/mesh/notes   NASAL SEPTUM SURGERY  1980's?   OOPHORECTOMY Right 2009   TONSILLECTOMY  1990's   VAGINAL HYSTERECTOMY  ` 06/2007    Family History  Problem Relation Age of Onset   Cancer Mother        lung   Hypertension Mother    Cancer Father        colon   Hypertension Sister    Cancer Sister    Cancer Brother    Hyperlipidemia Sister    Birth defects Maternal Grandmother        breast   Breast cancer Maternal Grandmother    Birth defects Paternal Grandmother        uterine, stomach, lung   Breast cancer Paternal Grandmother    Breast cancer Paternal Aunt     Social History   Socioeconomic History   Marital status: Married    Spouse name: Not on file   Number of children: 2   Years of education: Not on file   Highest education level: Not on file  Occupational History   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  Tobacco Use   Smoking status: Never   Smokeless tobacco: Never  Vaping Use   Vaping Use: Never used  Substance and Sexual Activity   Alcohol use: Not Currently    Alcohol/week: 0.0 standard drinks of alcohol    Comment: 01/24/2013 "drink or 2 once or /twice/year, if that"   Drug use: No   Sexual activity: Not Currently  Other Topics Concern   Not on file  Social History Narrative   No living will   Would want husband as POA--then sister, Neoma Laming   Would accept resuscitation attempts   Probably would not want tube feeds if cognitively unaware   Social Determinants of Health   Financial Resource Strain: Not on file  Food Insecurity: Not on file  Transportation Needs: Not on file  Physical Activity: Not on file  Stress: Not on file  Social Connections: Not on file  Intimate Partner Violence: Not on file   Review of Systems      Objective:   Physical Exam Constitutional:      Appearance: Normal appearance.  Neurological:     Mental Status: She  is alert.  Psychiatric:        Mood and Affect: Mood normal.        Behavior: Behavior normal.  Assessment & Plan:

## 2022-04-06 ENCOUNTER — Telehealth: Payer: Self-pay | Admitting: *Deleted

## 2022-04-06 MED ORDER — HYDROCODONE-ACETAMINOPHEN 5-325 MG PO TABS: 2.0000 | ORAL_TABLET | Freq: Two times a day (BID) | ORAL | 0 refills | Status: DC | PRN

## 2022-04-06 NOTE — Telephone Encounter (Signed)
[name] called and her pharmacy is out of hydrocodone 10/325.  They have 5 325 and 7.5 325.  Do you want to switch her to the 5/325 and she take two for now?

## 2022-04-07 ENCOUNTER — Encounter: Payer: Self-pay | Admitting: Family

## 2022-04-07 ENCOUNTER — Ambulatory Visit (INDEPENDENT_AMBULATORY_CARE_PROVIDER_SITE_OTHER): Payer: Medicare HMO | Admitting: Family

## 2022-04-07 VITALS — BP 112/67 | HR 92 | Temp 98.7°F | Ht 67.0 in | Wt 177.4 lb

## 2022-04-07 DIAGNOSIS — W5501XA Bitten by cat, initial encounter: Secondary | ICD-10-CM

## 2022-04-07 DIAGNOSIS — S61452A Open bite of left hand, initial encounter: Secondary | ICD-10-CM

## 2022-04-07 MED ORDER — DOXYCYCLINE HYCLATE 100 MG PO TABS: 100.0000 mg | ORAL_TABLET | Freq: Two times a day (BID) | ORAL | 0 refills | Status: AC

## 2022-04-07 NOTE — Progress Notes (Signed)
Subjective:     Patient ID: [name], female    DOB: 1966-05-10, 56 y.o.   MRN: [id number]  Chief Complaint  Patient presents with   Animal Bite    Pt was bit by her cat on Monday on her left hand, Pt c/o pain, swelling and redness. Has put peroxide and antibacterial soap.    HPI: Skin Lesion: Patient complains of a skin lesion of the hand. The lesion has been present for 2 days. Lesion has changed in 1 day. Symptoms associated with the lesion are: increasing diameter, pain, increased swelling. Patient denies drainage.    Assessment & Plan:   Problem List Items Addressed This Visit   None Visit Diagnoses     Cat bite of hand, left, initial encounter    -  Primary pt's personal cat, happened while at trying to contain at the vet. has rabies vaccine. mild swelling noted, and scant circle of erythema around bite, with scab. pt reports increasing pain and warmth. Also has RSD in same hand and not able to apply ice. Sending DOXY, advised on use & SE.    Relevant Medications   doxycycline (VIBRA-TABS) 100 MG tablet      Outpatient Medications Prior to Visit  Medication Sig Dispense Refill   Accu-Chek Softclix Lancets lancets Use as instructed 100 each 5   albuterol (VENTOLIN HFA) 108 (90 Base) MCG/ACT inhaler Inhale 2 puffs into the lungs every 6 (six) hours as needed for wheezing or shortness of breath. 18 g 1   CYMBALTA 30 MG capsule Take 3 capsules (90 mg total) by mouth daily. 270 capsule 2   glucose blood (ACCU-CHEK GUIDE) test strip Use as instructed 100 each 5   HYDROcodone-acetaminophen (NORCO/VICODIN) 5-325 MG tablet Take 2 tablets by mouth every 12 (twelve) hours as needed for moderate pain. 120 tablet 0   ibuprofen (ADVIL) 600 MG tablet TAKE 1 TABLET (600 MG TOTAL) BY MOUTH EVERY 8 (EIGHT) HOURS AS NEEDED FOR MILD PAIN. 90 tablet 5   loratadine (CLARITIN) 10 MG tablet Take 10 mg by mouth daily.     metFORMIN (GLUCOPHAGE-XR) 500 MG 24 hr tablet TAKE 2 TABLETS  BY MOUTH EVERY DAY WITH BREAKFAST 180 tablet 3   metoCLOPramide (REGLAN) 10 MG tablet Take 10 mg by mouth 2 (two) times daily.     montelukast (SINGULAIR) 10 MG tablet TAKE 1 TABLET BY MOUTH EVERYDAY AT BEDTIME 90 tablet 3   olmesartan (BENICAR) 40 MG tablet TAKE 1 TABLET BY MOUTH EVERY DAY 90 tablet 3   omeprazole (PRILOSEC) 40 MG capsule Take 40 mg by mouth 2 (two) times daily.     rosuvastatin (CRESTOR) 5 MG tablet Take 1 tablet (5 mg total) by mouth daily. 90 tablet 3   SYMBICORT 160-4.5 MCG/ACT inhaler Inhale 2 puffs into the lungs daily. 1 each 11   tiZANidine (ZANAFLEX) 4 MG tablet TAKE 1 TABLET (4 MG TOTAL) BY MOUTH 4 (FOUR) TIMES DAILY. 120 tablet 3   topiramate (TOPAMAX) 100 MG tablet TAKE 1 TABLET BY MOUTH EVERY MORNING AND TAKE 2 TABLETS BY MOUTH EVERY EVENING 90 tablet 5   traMADol (ULTRAM) 50 MG tablet TAKE 1 TABLET BY MOUTH TWICE A DAY. DIRECTION CHANGE. 60 tablet 2   traMADol (ULTRAM-ER) 100 MG 24 hr tablet Take 1 tablet (100 mg total) by mouth daily. 30 tablet 2   traZODone (DESYREL) 50 MG tablet TAKE 1.5 TABLETS BY MOUTH AT BEDTIME 135 tablet 2   No facility-administered medications prior to visit.  Past Medical History:  Diagnosis Date   Allergy    Anxiety    Arthritis    "just the norm" (01/24/2013)   ASCVD (arteriosclerotic cardiovascular disease)    Asthma    Celiac disease    Daily headache    "depends on the season" (01/24/2013)   Diabetes mellitus without complication (HCC)    Fibromyalgia    GERD (gastroesophageal reflux disease)    History of colonic polyps    Hyperlipidemia    Hypertension    Migraines    Obesity (BMI 30.0-34.9)    RSD (reflex sympathetic dystrophy)    Sleep apnea    NO MACHINE RECOMMENDED   TIA (transient ischemic attack) ~ 2009    Past Surgical History:  Procedure Laterality Date   APPENDECTOMY  ~ 06/2007   BLADDER REPAIR  ~ 06/2007   "same day after bladder lift" (01/24/2013)   BLADDER SUSPENSION  ~ 06/2007   BREAST BIOPSY Left     DIAGNOSTIC LAPAROSCOPY  1990's & ~ 2000   "I've had a couple; for endometrosis" (01/24/2013)   [place] N/A 01/24/2013   Procedure: LAPAROSCOPIC INCISIONAL HERNIA;  Surgeon: Harl Bowie, MD;  Location: [place];  Service: General;  Laterality: N/A;   INSERTION OF MESH N/A 01/24/2013   Procedure: INSERTION OF MESH;  Surgeon: Harl Bowie, MD;  Location: [place];  Service: General;  Laterality: N/A;   LAPAROSCOPIC INCISIONAL / UMBILICAL / [place]  01/24/2013   IHR w/mesh/notes   NASAL SEPTUM SURGERY  1980's?   OOPHORECTOMY Right 2009   TONSILLECTOMY  1990's   VAGINAL HYSTERECTOMY  ` 06/2007    Allergies  Allergen Reactions   Meperidine Anaphylaxis, Itching and Swelling    REACTION: swelling   Cymbalta [Duloxetine Hcl] Hives    Generic makes her feel like she is crawling out of her skin   Diazepam Hives    REACTION: swelling   Other     Axe Cologne   Shellfish Allergy Hives       Objective:    Physical Exam Vitals and nursing note reviewed.  Constitutional:      Appearance: Normal appearance.  Cardiovascular:     Rate and Rhythm: Normal rate and regular rhythm.  Pulmonary:     Effort: Pulmonary effort is normal.     Breath sounds: Normal breath sounds.  Musculoskeletal:        General: Normal range of motion.  Skin:    General: Skin is warm and dry.     Findings: Lesion (left dorsal hand between thumb & forefinger, with 0.4cm scab and mild erythema) present.       Neurological:     Mental Status: She is alert.  Psychiatric:        Mood and Affect: Mood normal.        Behavior: Behavior normal.     BP 112/67 (BP Location: Right Arm, Patient Position: Sitting, Cuff Size: Large)   Pulse 92   Temp 98.7 F (37.1 C) (Temporal)   Ht 5' 7"  (1.702 m)   Wt 177 lb 6 oz (80.5 kg)   SpO2 96%   BMI 27.78 kg/m  Wt Readings from Last 3 Encounters:  04/07/22 177 lb 6 oz (80.5 kg)  04/01/22 176 lb 2 oz (79.9 kg)  03/17/22  178 lb 9.6 oz (81 kg)        Meds ordered this encounter  Medications   doxycycline (VIBRA-TABS) 100  MG tablet    Sig: Take 1 tablet (100 mg total) by mouth 2 (two) times daily for 7 days.    Dispense:  14 tablet    Refill:  0    Order Specific Question:   Supervising Provider    Answer:   ANDY, CAMILLE L [[id number]]    Jeanie Sewer, NP

## 2022-04-29 ENCOUNTER — Encounter: Payer: Worker's Compensation | Attending: Registered Nurse | Admitting: Registered Nurse

## 2022-04-29 ENCOUNTER — Encounter: Payer: Self-pay | Admitting: Registered Nurse

## 2022-04-29 VITALS — BP 131/90 | HR 88 | Ht 67.0 in | Wt 175.0 lb

## 2022-04-29 DIAGNOSIS — G90512 Complex regional pain syndrome I of left upper limb: Secondary | ICD-10-CM | POA: Diagnosis not present

## 2022-04-29 DIAGNOSIS — G4700 Insomnia, unspecified: Secondary | ICD-10-CM | POA: Diagnosis present

## 2022-04-29 DIAGNOSIS — G894 Chronic pain syndrome: Secondary | ICD-10-CM

## 2022-04-29 DIAGNOSIS — F3289 Other specified depressive episodes: Secondary | ICD-10-CM | POA: Diagnosis present

## 2022-04-29 DIAGNOSIS — M62838 Other muscle spasm: Secondary | ICD-10-CM | POA: Diagnosis present

## 2022-04-29 DIAGNOSIS — Z5181 Encounter for therapeutic drug level monitoring: Secondary | ICD-10-CM

## 2022-04-29 DIAGNOSIS — Z79891 Long term (current) use of opiate analgesic: Secondary | ICD-10-CM | POA: Diagnosis not present

## 2022-04-29 MED ORDER — HYDROCODONE-ACETAMINOPHEN 10-325 MG PO TABS: 1.0000 | ORAL_TABLET | Freq: Two times a day (BID) | ORAL | 0 refills | Status: DC | PRN

## 2022-04-29 MED ORDER — TRAMADOL HCL 50 MG PO TABS: ORAL_TABLET | ORAL | 2 refills | Status: DC

## 2022-04-29 MED ORDER — TRAMADOL HCL ER 100 MG PO TB24: 100.0000 mg | ORAL_TABLET | Freq: Every day | ORAL | 2 refills | Status: DC

## 2022-04-29 MED ORDER — TOPIRAMATE 100 MG PO TABS: ORAL_TABLET | ORAL | 5 refills | Status: DC

## 2022-04-29 MED ORDER — TRAZODONE HCL 50 MG PO TABS: ORAL_TABLET | ORAL | 2 refills | Status: DC

## 2022-04-29 NOTE — Progress Notes (Signed)
Subjective:    Patient ID: [name], female    DOB: 1965/10/26, 56 y.o.   MRN: [id number]  HPI: [name] is a 56 y.o. female who returns for follow up appointment for chronic pain and medication refill. She states her pain is located in her  left arm and left wrist. She rates her pain 7. Her current exercise regime is walking and performing stretching exercises.  [name] Morphine equivalent is 40.67 MME.  Oral Swab was Performed today.      Pain Inventory Average Pain 8 Pain Right Now 7 My pain is constant, sharp, burning, stabbing, tingling, and aching  In the last 24 hours, has pain interfered with the following? General activity 5 Relation with others 4 Enjoyment of life 4 What TIME of day is your pain at its worst? daytime, evening, and night Sleep (in general) Poor  Pain is worse with: walking, bending, sitting, standing, unsure, and some activites Pain improves with: rest, pacing activities, medication, TENS, and Heat Relief from Meds: 6  Family History  Problem Relation Age of Onset   Cancer Mother        lung   Hypertension Mother    Cancer Father        colon   Hypertension Sister    Cancer Sister    Cancer Brother    Hyperlipidemia Sister    Birth defects Maternal Grandmother        breast   Breast cancer Maternal Grandmother    Birth defects Paternal Grandmother        uterine, stomach, lung   Breast cancer Paternal Grandmother    Breast cancer Paternal Aunt    Social History   Socioeconomic History   Marital status: Married    Spouse name: Not on file   Number of children: 2   Years of education: Not on file   Highest education level: Not on file  Occupational History   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  Tobacco Use   Smoking status: Never   Smokeless tobacco: Never  Vaping Use   Vaping Use: Never used  Substance and Sexual Activity   Alcohol use: Not Currently    Alcohol/week: 0.0 standard drinks of  alcohol    Comment: 01/24/2013 "drink or 2 once or /twice/year, if that"   Drug use: No   Sexual activity: Not Currently  Other Topics Concern   Not on file  Social History Narrative   No living will   Would want husband as POA--then sister, [name]   Would accept resuscitation attempts   Probably would not want tube feeds if cognitively unaware   Social Determinants of Health   Financial Resource Strain: Not on file  Food Insecurity: Not on file  Transportation Needs: Not on file  Physical Activity: Not on file  Stress: Not on file  Social Connections: Not on file   Past Surgical History:  Procedure Laterality Date   APPENDECTOMY  ~ 06/2007   BLADDER REPAIR  ~ 06/2007   "same day after bladder lift" (01/24/2013)   BLADDER SUSPENSION  ~ 06/2007   BREAST BIOPSY Left    DIAGNOSTIC LAPAROSCOPY  1990's & ~ 2000   "I've had a couple; for endometrosis" (01/24/2013)   [place] N/A 01/24/2013   Procedure: LAPAROSCOPIC INCISIONAL HERNIA;  Surgeon: Harl Bowie, MD;  Location: [place];  Service: General;  Laterality: N/A;   INSERTION OF MESH N/A 01/24/2013   Procedure:  INSERTION OF MESH;  Surgeon: Harl Bowie, MD;  Location: [place];  Service: General;  Laterality: N/A;   LAPAROSCOPIC INCISIONAL / UMBILICAL / [place]  01/24/2013   IHR w/mesh/notes   NASAL SEPTUM SURGERY  1980's?   OOPHORECTOMY Right 2009   TONSILLECTOMY  1990's   VAGINAL HYSTERECTOMY  ` 06/2007   Past Surgical History:  Procedure Laterality Date   APPENDECTOMY  ~ 06/2007   BLADDER REPAIR  ~ 06/2007   "same day after bladder lift" (01/24/2013)   BLADDER SUSPENSION  ~ 06/2007   BREAST BIOPSY Left    DIAGNOSTIC LAPAROSCOPY  1990's & ~ 2000   "I've had a couple; for endometrosis" (01/24/2013)   [place] N/A 01/24/2013   Procedure: LAPAROSCOPIC INCISIONAL HERNIA;  Surgeon: Harl Bowie, MD;  Location: [place];  Service: General;  Laterality:  N/A;   INSERTION OF MESH N/A 01/24/2013   Procedure: INSERTION OF MESH;  Surgeon: Harl Bowie, MD;  Location: [place];  Service: General;  Laterality: N/A;   LAPAROSCOPIC INCISIONAL / UMBILICAL / [place]  01/24/2013   IHR w/mesh/notes   NASAL SEPTUM SURGERY  1980's?   OOPHORECTOMY Right 2009   TONSILLECTOMY  1990's   VAGINAL HYSTERECTOMY  ` 06/2007   Past Medical History:  Diagnosis Date   Allergy    Anxiety    Arthritis    "just the norm" (01/24/2013)   ASCVD (arteriosclerotic cardiovascular disease)    Asthma    Celiac disease    Daily headache    "depends on the season" (01/24/2013)   Diabetes mellitus without complication (HCC)    Fibromyalgia    GERD (gastroesophageal reflux disease)    History of colonic polyps    Hyperlipidemia    Hypertension    Migraines    Obesity (BMI 30.0-34.9)    RSD (reflex sympathetic dystrophy)    Sleep apnea    NO MACHINE RECOMMENDED   TIA (transient ischemic attack) ~ 2009   Ht 5' 7"  (1.702 m)   Wt 175 lb (79.4 kg)   BMI 27.41 kg/m   Opioid Risk Score:   Fall Risk Score:  `1  Depression screen PHQ 2/9     03/17/2022    1:27 PM 02/11/2022    1:36 PM 12/09/2021    1:22 PM 11/10/2021    1:28 PM 10/06/2021    1:15 PM 08/18/2021    1:27 PM 06/12/2021   12:52 PM  Depression screen PHQ 2/9  Decreased Interest 1 0 1 1 1 1 1   Down, Depressed, Hopeless 1 0 1 1 1 1 1   PHQ - 2 Score 2 0 2 2 2 2 2   Altered sleeping      3   Tired, decreased energy      3   Change in appetite      1   Feeling bad or failure about yourself       1   Trouble concentrating      1   Moving slowly or fidgety/restless      1   Suicidal thoughts      0   PHQ-9 Score      12   Difficult doing work/chores      Somewhat difficult     Review of Systems  Musculoskeletal:        Pain in left arm  All other systems reviewed and are negative.  Objective:   Physical Exam Vitals and nursing note reviewed.  Constitutional:      Appearance:  Normal appearance.  Neck:     Comments: Cervical Paraspinal Tenderness: C-5-C-6 Cardiovascular:     Rate and Rhythm: Normal rate and regular rhythm.     Pulses: Normal pulses.     Heart sounds: Normal heart sounds.  Pulmonary:     Effort: Pulmonary effort is normal.     Breath sounds: Normal breath sounds.  Musculoskeletal:     Cervical back: Normal range of motion and neck supple.     Comments: Normal Muscle Bulk and Muscle Testing Reveals:  Upper Extremities: Right: Full ROM and Muscle Strength  5/5 Left: Upper Extremity: Decreased ROM 45 Degrees  and Muscle  Strength 2/5 Bilateral AC Joint Tenderness: L>R Thoracic Hypersensitivity Lower Extremities: Decreased ROM and Muscle Strength  4/5 Bilateral Lower Extremities Flexion Produces Pain into her Bilateral Lower Extremities and Bilateral Feet Arises from Table Slowly  Antalgic  Gait     Skin:    General: Skin is warm and dry.  Neurological:     Mental Status: She is alert and oriented to person, place, and time.  Psychiatric:        Mood and Affect: Mood normal.        Behavior: Behavior normal.         Assessment & Plan:  1.Complex Regional Pain Syndrome Type 1: Continue Topamax and Cymbalta. 04/29/2022. Refilled: Hydro-codone 7.5/325mg one tablet twice a day one in the morning and one at bedtime #60. Continue Tramadol 100 mg ER daily #30 and Tramadol 50 mg one tablet by mouth BID. One tablet in the afternoon and one tablet in the evening. We will continue the opioid monitoring program, this consists of regular clinic visits, examinations, urine drug screen, pill counts as well as use of [place] Controlled Substance Reporting system. A 12 month History has been reviewed on the [place] Controlled Substance Reporting System  On 04/29/2022. 2. Depression: Continue current medication regimen with Cymbalta.04/29/2022 3. Insomnia: Continue current medication regimen with  Trazodone one and half tablet at bedtime   04/29/2022 4. Muscle Spasms: Continue current medication regimen with Tizanidine. 04/29/2022 5. Chronic Pain Syndrome: Continue Ibuprofen BID as needed. 04/29/2022   F/U in 1 month

## 2022-05-05 LAB — DRUG TOX MONITOR 1 W/CONF, ORAL FLD
Alprazolam: NEGATIVE ng/mL (ref <0.50)
Amphetamines: NEGATIVE ng/mL (ref <10)
Barbiturates: NEGATIVE ng/mL (ref <10)
Benzodiazepines: NEGATIVE ng/mL (ref <0.50)
Buprenorphine: NEGATIVE ng/mL (ref <0.10)
Clonazepam: NEGATIVE ng/mL (ref <0.50)
Cocaine: NEGATIVE ng/mL (ref <5.0)
Codeine: NEGATIVE ng/mL (ref <2.5)
Diazepam: NEGATIVE ng/mL (ref <0.50)
Dihydrocodeine: 5.8 ng/mL — ABNORMAL HIGH (ref <2.5)
Fentanyl: NEGATIVE ng/mL (ref <0.10)
Flunitrazepam: NEGATIVE ng/mL (ref <0.50)
Flurazepam: NEGATIVE ng/mL (ref <0.50)
Heroin Metabolite: NEGATIVE ng/mL (ref <1.0)
Hydrocodone: 116.6 ng/mL — ABNORMAL HIGH (ref <2.5)
Hydromorphone: NEGATIVE ng/mL (ref <2.5)
Lorazepam: NEGATIVE ng/mL (ref <0.50)
MARIJUANA: NEGATIVE ng/mL (ref <2.5)
MDMA: NEGATIVE ng/mL (ref <10)
Meprobamate: NEGATIVE ng/mL (ref <2.5)
Methadone: NEGATIVE ng/mL (ref <5.0)
Midazolam: NEGATIVE ng/mL (ref <0.50)
Morphine: NEGATIVE ng/mL (ref <2.5)
Nicotine Metabolite: NEGATIVE ng/mL (ref <5.0)
Nordiazepam: NEGATIVE ng/mL (ref <0.50)
Norhydrocodone: 11.7 ng/mL — ABNORMAL HIGH (ref <2.5)
Noroxycodone: NEGATIVE ng/mL (ref <2.5)
Opiates: POSITIVE ng/mL — AB (ref <2.5)
Oxazepam: NEGATIVE ng/mL (ref <0.50)
Oxycodone: NEGATIVE ng/mL (ref <2.5)
Oxymorphone: NEGATIVE ng/mL (ref <2.5)
Phencyclidine: NEGATIVE ng/mL (ref <10)
Tapentadol: NEGATIVE ng/mL (ref <5.0)
Temazepam: NEGATIVE ng/mL (ref <0.50)
Tramadol: >500 ng/mL — ABNORMAL HIGH (ref <5.0)
Tramadol: POSITIVE ng/mL — AB (ref <5.0)
Triazolam: NEGATIVE ng/mL (ref <0.50)
Zolpidem: NEGATIVE ng/mL (ref <5.0)

## 2022-05-05 LAB — DRUG TOX ALC METAB W/CON, ORAL FLD: Alcohol Metabolite: NEGATIVE ng/mL (ref <25)

## 2022-05-07 ENCOUNTER — Telehealth: Payer: Self-pay | Admitting: *Deleted

## 2022-05-07 NOTE — Telephone Encounter (Signed)
Oral swab drug screen was consistent for prescribed medications.  ?

## 2022-06-01 ENCOUNTER — Encounter: Payer: Worker's Compensation | Attending: Registered Nurse | Admitting: Registered Nurse

## 2022-06-01 ENCOUNTER — Encounter: Payer: Self-pay | Admitting: Registered Nurse

## 2022-06-01 VITALS — BP 127/84 | HR 87 | Ht 67.0 in | Wt 178.0 lb

## 2022-06-01 DIAGNOSIS — G90512 Complex regional pain syndrome I of left upper limb: Secondary | ICD-10-CM | POA: Diagnosis present

## 2022-06-01 DIAGNOSIS — F3289 Other specified depressive episodes: Secondary | ICD-10-CM | POA: Diagnosis present

## 2022-06-01 DIAGNOSIS — Z5181 Encounter for therapeutic drug level monitoring: Secondary | ICD-10-CM

## 2022-06-01 DIAGNOSIS — G894 Chronic pain syndrome: Secondary | ICD-10-CM

## 2022-06-01 DIAGNOSIS — G4700 Insomnia, unspecified: Secondary | ICD-10-CM | POA: Diagnosis present

## 2022-06-01 DIAGNOSIS — M62838 Other muscle spasm: Secondary | ICD-10-CM

## 2022-06-01 DIAGNOSIS — Z79891 Long term (current) use of opiate analgesic: Secondary | ICD-10-CM

## 2022-06-01 MED ORDER — CYMBALTA 30 MG PO CPEP: 90.0000 mg | ORAL_CAPSULE | Freq: Every day | ORAL | 2 refills | Status: DC

## 2022-06-01 MED ORDER — TIZANIDINE HCL 4 MG PO TABS: ORAL_TABLET | ORAL | 3 refills | Status: DC

## 2022-06-01 MED ORDER — HYDROCODONE-ACETAMINOPHEN 10-325 MG PO TABS: 1.0000 | ORAL_TABLET | Freq: Two times a day (BID) | ORAL | 0 refills | Status: DC | PRN

## 2022-06-01 NOTE — Progress Notes (Signed)
Subjective:    Patient ID: [name], female    DOB: Mar 16, 1966, 56 y.o.   MRN: [id number]  HPI: [name] is a 56 y.o. female who returns for follow up appointment for chronic pain and medication refill. She states her pain is located in her left arm. She rates her pain 8. Her current exercise regime is walking and performing stretching exercises.  [name] Morphine equivalent is 60.00 MME.   Last Oral Swab was Performed on 04/29/2022, it was consistent.      Pain Inventory Average Pain 7 Pain Right Now 8 My pain is intermittent, constant, sharp, burning, stabbing, tingling, and aching  In the last 24 hours, has pain interfered with the following? General activity 3 Relation with others 3 Enjoyment of life 3 What TIME of day is your pain at its worst? morning , daytime, evening, and night Sleep (in general) Poor  Pain is worse with: walking, bending, sitting, standing, unsure, and some activites Pain improves with: rest, pacing activities, medication, TENS, and heat Relief from Meds: 7  Family History  Problem Relation Age of Onset   Cancer Mother        lung   Hypertension Mother    Cancer Father        colon   Hypertension Sister    Cancer Sister    Cancer Brother    Hyperlipidemia Sister    Birth defects Maternal Grandmother        breast   Breast cancer Maternal Grandmother    Birth defects Paternal Grandmother        uterine, stomach, lung   Breast cancer Paternal Grandmother    Breast cancer Paternal Aunt    Social History   Socioeconomic History   Marital status: Married    Spouse name: Not on file   Number of children: 2   Years of education: Not on file   Highest education level: Not on file  Occupational History   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  Tobacco Use   Smoking status: Never   Smokeless tobacco: Never  Vaping Use   Vaping Use: Never used  Substance and Sexual Activity   Alcohol use: Not Currently     Alcohol/week: 0.0 standard drinks of alcohol    Comment: 01/24/2013 "drink or 2 once or /twice/year, if that"   Drug use: No   Sexual activity: Not Currently  Other Topics Concern   Not on file  Social History Narrative   No living will   Would want husband as POA--then sister, [name]   Would accept resuscitation attempts   Probably would not want tube feeds if cognitively unaware   Social Determinants of Health   Financial Resource Strain: Not on file  Food Insecurity: Not on file  Transportation Needs: Not on file  Physical Activity: Not on file  Stress: Not on file  Social Connections: Not on file   Past Surgical History:  Procedure Laterality Date   APPENDECTOMY  ~ 06/2007   BLADDER REPAIR  ~ 06/2007   "same day after bladder lift" (01/24/2013)   BLADDER SUSPENSION  ~ 06/2007   BREAST BIOPSY Left    DIAGNOSTIC LAPAROSCOPY  1990's & ~ 2000   "I've had a couple; for endometrosis" (01/24/2013)   [place] N/A 01/24/2013   Procedure: LAPAROSCOPIC INCISIONAL HERNIA;  Surgeon: Harl Bowie, MD;  Location: [place];  Service: General;  Laterality: N/A;   INSERTION OF MESH N/A  01/24/2013   Procedure: INSERTION OF MESH;  Surgeon: Harl Bowie, MD;  Location: [place];  Service: General;  Laterality: N/A;   LAPAROSCOPIC INCISIONAL / UMBILICAL / [place]  01/24/2013   IHR w/mesh/notes   NASAL SEPTUM SURGERY  1980's?   OOPHORECTOMY Right 2009   TONSILLECTOMY  1990's   VAGINAL HYSTERECTOMY  ` 06/2007   Past Surgical History:  Procedure Laterality Date   APPENDECTOMY  ~ 06/2007   BLADDER REPAIR  ~ 06/2007   "same day after bladder lift" (01/24/2013)   BLADDER SUSPENSION  ~ 06/2007   BREAST BIOPSY Left    DIAGNOSTIC LAPAROSCOPY  1990's & ~ 2000   "I've had a couple; for endometrosis" (01/24/2013)   [place] N/A 01/24/2013   Procedure: LAPAROSCOPIC INCISIONAL HERNIA;  Surgeon: Harl Bowie, MD;  Location: [place];  Service: General;  Laterality: N/A;   INSERTION OF MESH N/A 01/24/2013   Procedure: INSERTION OF MESH;  Surgeon: Harl Bowie, MD;  Location: [place];  Service: General;  Laterality: N/A;   LAPAROSCOPIC INCISIONAL / UMBILICAL / [place]  01/24/2013   IHR w/mesh/notes   NASAL SEPTUM SURGERY  1980's?   OOPHORECTOMY Right 2009   TONSILLECTOMY  1990's   VAGINAL HYSTERECTOMY  ` 06/2007   Past Medical History:  Diagnosis Date   Allergy    Anxiety    Arthritis    "just the norm" (01/24/2013)   ASCVD (arteriosclerotic cardiovascular disease)    Asthma    Celiac disease    Daily headache    "depends on the season" (01/24/2013)   Diabetes mellitus without complication (HCC)    Fibromyalgia    GERD (gastroesophageal reflux disease)    History of colonic polyps    Hyperlipidemia    Hypertension    Migraines    Obesity (BMI 30.0-34.9)    RSD (reflex sympathetic dystrophy)    Sleep apnea    NO MACHINE RECOMMENDED   TIA (transient ischemic attack) ~ 2009   Wt 178 lb (80.7 kg)   BMI 27.88 kg/m   Opioid Risk Score:   Fall Risk Score:  `1  Depression screen PHQ 2/9     06/01/2022    1:10 PM 04/29/2022    1:49 PM 03/17/2022    1:27 PM 02/11/2022    1:36 PM 12/09/2021    1:22 PM 11/10/2021    1:28 PM 10/06/2021    1:15 PM  Depression screen PHQ 2/9  Decreased Interest 1 1 1  0 1 1 1   Down, Depressed, Hopeless 1 1 1  0 1 1 1   PHQ - 2 Score 2 2 2  0 2 2 2     Review of Systems  Musculoskeletal:        Left arm  All other systems reviewed and are negative.      Objective:   Physical Exam Vitals and nursing note reviewed.  Constitutional:      Appearance: Normal appearance.  Neck:     Comments: Cervical Paraspinal Tenderness: C-5-C-6 Cardiovascular:     Rate and Rhythm: Normal rate and regular rhythm.     Pulses: Normal pulses.     Heart sounds: Normal heart sounds.  Pulmonary:     Effort: Pulmonary effort is normal.     Breath sounds: Normal breath sounds.   Musculoskeletal:     Cervical back: Normal range of motion and neck supple.     Comments: Normal Muscle Bulk and  Muscle Testing Reveals:  Upper Extremities: Right: Full ROM and Muscle Strength 5/5 Left Upper Extremity: Decreased ROM 45 Degrees and Muscle Strength 3/5 Bilateral AC Joint Tenderness Thoracic Paraspinal Tenderness: T-7-T-9  Lumbar Paraspinal Tenderness: L-4-L-5 Lower Extremities: Decreased ROM and Muscle Strength 4/5 Bilateral Lower Extremities Flexion Produces Pain into her lower extremities and Bilateral Feet Arises from Table Slowly Antalgic Gait     Skin:    General: Skin is warm and dry.  Neurological:     Mental Status: She is alert and oriented to person, place, and time.  Psychiatric:        Mood and Affect: Mood normal.        Behavior: Behavior normal.         Assessment & Plan:  1.Complex Regional Pain Syndrome Type 1: Continue Topamax and Cymbalta. 06/01/2022. Refilled: Hydro-codone 7.5/325mg one tablet twice a day one in the morning and one at bedtime #60. Continue Tramadol 100 mg ER daily #30 and Tramadol 50 mg one tablet by mouth BID. One tablet in the afternoon and one tablet in the evening. We will continue the opioid monitoring program, this consists of regular clinic visits, examinations, urine drug screen, pill counts as well as use of [place] Controlled Substance Reporting system. A 12 month History has been reviewed on the [place] Controlled Substance Reporting System  On 06/01/2022. 2. Depression: Continue current medication regimen with Cymbalta.06/01/2022 3. Insomnia: Continue current medication regimen with  Trazodone one and half tablet at bedtime  06/01/2022 4. Muscle Spasms: Continue current medication regimen with Tizanidine. 06/01/2022 5. Chronic Pain Syndrome: Continue Ibuprofen BID as needed. 06/01/2022   F/U in 1 month

## 2022-06-11 DIAGNOSIS — Z0279 Encounter for issue of other medical certificate: Secondary | ICD-10-CM

## 2022-06-23 ENCOUNTER — Encounter: Payer: Self-pay | Admitting: Internal Medicine

## 2022-06-23 ENCOUNTER — Ambulatory Visit (INDEPENDENT_AMBULATORY_CARE_PROVIDER_SITE_OTHER): Payer: Medicare HMO | Admitting: Internal Medicine

## 2022-06-23 VITALS — BP 124/84 | HR 84 | Temp 97.8°F | Ht 68.0 in | Wt 178.0 lb

## 2022-06-23 DIAGNOSIS — Z0000 Encounter for general adult medical examination without abnormal findings: Secondary | ICD-10-CM

## 2022-06-23 DIAGNOSIS — F39 Unspecified mood [affective] disorder: Secondary | ICD-10-CM | POA: Diagnosis not present

## 2022-06-23 DIAGNOSIS — R69 Illness, unspecified: Secondary | ICD-10-CM | POA: Diagnosis not present

## 2022-06-23 DIAGNOSIS — Z23 Encounter for immunization: Secondary | ICD-10-CM | POA: Diagnosis not present

## 2022-06-23 DIAGNOSIS — E1159 Type 2 diabetes mellitus with other circulatory complications: Secondary | ICD-10-CM

## 2022-06-23 DIAGNOSIS — I10 Essential (primary) hypertension: Secondary | ICD-10-CM | POA: Diagnosis not present

## 2022-06-23 DIAGNOSIS — F1120 Opioid dependence, uncomplicated: Secondary | ICD-10-CM

## 2022-06-23 LAB — LIPID PANEL
Cholesterol: 187 mg/dL (ref 0–200)
HDL: 39.8 mg/dL (ref >39.00)
NonHDL: 147.11
Total CHOL/HDL Ratio: 5
Triglycerides: 369 mg/dL — ABNORMAL HIGH (ref 0.0–149.0)
VLDL: 73.8 mg/dL — ABNORMAL HIGH (ref 0.0–40.0)

## 2022-06-23 LAB — CBC
HCT: 42 % (ref 36.0–46.0)
Hemoglobin: 13.4 g/dL (ref 12.0–15.0)
MCHC: 32 g/dL (ref 30.0–36.0)
MCV: 88.6 fl (ref 78.0–100.0)
Platelets: 264 10*3/uL (ref 150.0–400.0)
RBC: 4.73 Mil/uL (ref 3.87–5.11)
RDW: 14.2 % (ref 11.5–15.5)
WBC: 10.1 10*3/uL (ref 4.0–10.5)

## 2022-06-23 LAB — COMPREHENSIVE METABOLIC PANEL
ALT: 11 U/L (ref 0–35)
AST: 13 U/L (ref 0–37)
Albumin: 4.5 g/dL (ref 3.5–5.2)
Alkaline Phosphatase: 85 U/L (ref 39–117)
BUN: 18 mg/dL (ref 6–23)
CO2: 22 mEq/L (ref 19–32)
Calcium: 9.4 mg/dL (ref 8.4–10.5)
Chloride: 105 mEq/L (ref 96–112)
Creatinine, Ser: 0.95 mg/dL (ref 0.40–1.20)
GFR: 66.16 mL/min (ref >60.00)
Glucose, Bld: 116 mg/dL — ABNORMAL HIGH (ref 70–99)
Potassium: 4 mEq/L (ref 3.5–5.1)
Sodium: 139 mEq/L (ref 135–145)
Total Bilirubin: 0.4 mg/dL (ref 0.2–1.2)
Total Protein: 7 g/dL (ref 6.0–8.3)

## 2022-06-23 LAB — MICROALBUMIN / CREATININE URINE RATIO
Creatinine,U: 210.3 mg/dL
Microalb Creat Ratio: 2 mg/g (ref 0.0–30.0)
Microalb, Ur: 4.3 mg/dL — ABNORMAL HIGH (ref 0.0–1.9)

## 2022-06-23 LAB — LDL CHOLESTEROL, DIRECT: Direct LDL: 95 mg/dL

## 2022-06-23 LAB — HM DIABETES FOOT EXAM

## 2022-06-23 LAB — TSH: TSH: 3.35 u[IU]/mL (ref 0.35–5.50)

## 2022-06-23 LAB — HEMOGLOBIN A1C: Hgb A1c MFr Bld: 7.2 % — ABNORMAL HIGH (ref 4.6–6.5)

## 2022-06-23 MED ORDER — ACCU-CHEK SOFTCLIX LANCETS MISC: 5 refills | Status: DC

## 2022-06-23 NOTE — Progress Notes (Signed)
Subjective:    Patient ID: [name], female    DOB: 18-Feb-1966, 56 y.o.   MRN: [id number]  HPI Here for Medicare wellness visit and follow up of chronic health conditions Reviewed advanced directives Reviewed other doctors---Dr Thomas--pain/physiatry, Dr Eligha Bridegroom, Dr Meroth--audiology, Dr Glade Nurse No hospitalizations or surgery this year Some hearing loss---aides suggested (she is holding off) Vision is okay--reading glasses only No alcohol or tobacco Does try to walk some times---not comfortable getting out of pool (gets anxious and weighted down feeling) Did trip once--no sig injury Chronic mood issues Husband helps with shopping and housework--she has some limitations No decline in memory--some chronic changes  Does note some fluttering/palpitations at times Always at rest---like in bed Feels fast--but gone in seconds Mostly when yawning  No symptoms with activity---just chronic DOE (slight progression) This is worse with humidity Some chronic cough Symbicort has helped No chest pain No dizziness or syncope Some edema---stable  Not checking sugars lately--ran out of lancets Was 130-160 before Tolerating the statin---initial joint pains resolved 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 18 g 1   CYMBALTA 30 MG capsule Take 3 capsules (90 mg total) by mouth daily. 270 capsule 2   glucose blood (ACCU-CHEK GUIDE) test strip Use as instructed 100 each 5    HYDROcodone-acetaminophen (NORCO) 10-325 MG tablet Take 1 tablet by mouth 2 (two) times daily as needed. Please Fill Today. 60 tablet 0   ibuprofen (ADVIL) 600 MG tablet TAKE 1 TABLET (600 MG TOTAL) BY MOUTH EVERY 8 (EIGHT) HOURS AS NEEDED FOR MILD PAIN. 90 tablet 5   loratadine (CLARITIN) 10 MG tablet Take 10 mg by mouth daily.     metFORMIN (GLUCOPHAGE-XR) 500 MG 24 hr tablet TAKE 2 TABLETS BY MOUTH EVERY DAY WITH BREAKFAST 180 tablet 3   metoCLOPramide (REGLAN) 10 MG tablet Take 10 mg by mouth 2 (two) times daily.     montelukast (SINGULAIR) 10 MG tablet TAKE 1 TABLET BY MOUTH EVERYDAY AT BEDTIME 90 tablet 3   olmesartan (BENICAR) 40 MG tablet TAKE 1 TABLET BY MOUTH EVERY DAY 90 tablet 3   omeprazole (PRILOSEC) 40 MG capsule Take 40 mg by mouth 2 (two) times daily.     rosuvastatin (CRESTOR) 5 MG tablet Take 1 tablet (5 mg total) by mouth daily. 90 tablet 3   SYMBICORT 160-4.5 MCG/ACT inhaler Inhale 2 puffs into the lungs daily. 1 each 11   tiZANidine (ZANAFLEX) 4 MG tablet TAKE 1 TABLET (4 MG TOTAL) BY MOUTH 4 (FOUR) TIMES DAILY. 120 tablet 3   topiramate (TOPAMAX) 100 MG tablet TAKE 1 TABLET BY MOUTH EVERY MORNING AND TAKE 2 TABLETS BY MOUTH EVERY EVENING 90 tablet 5   traMADol (ULTRAM) 50 MG tablet TAKE 1 TABLET BY MOUTH TWICE A DAY. DIRECTION CHANGE. 60 tablet 2   traMADol (ULTRAM-ER) 100 MG 24 hr tablet Take 1 tablet (100 mg total) by mouth daily. 30 tablet 2   traZODone (DESYREL) 50 MG tablet TAKE 1.5 TABLETS BY MOUTH AT BEDTIME 135 tablet 2   [  DISCONTINUED] losartan (COZAAR) 100 MG tablet TAKE 1 TABLET BY MOUTH EVERY DAY 90 tablet 0   No current facility-administered medications on file prior to visit.    Allergies  Allergen Reactions   Meperidine Anaphylaxis, Itching and Swelling    REACTION: swelling   Cymbalta [Duloxetine Hcl] Hives    Generic makes her feel like she is crawling out of her skin   Diazepam Hives    REACTION: swelling   Other     [place] Cologne   Shellfish  Allergy Hives    Past Medical History:  Diagnosis Date   Allergy    Anxiety    Arthritis    "just the norm" (01/24/2013)   ASCVD (arteriosclerotic cardiovascular disease)    Asthma    Celiac disease    Daily headache    "depends on the season" (01/24/2013)   Diabetes mellitus without complication (HCC)    Fibromyalgia    GERD (gastroesophageal reflux disease)    History of colonic polyps    Hyperlipidemia    Hypertension    Migraines    Obesity (BMI 30.0-34.9)    RSD (reflex sympathetic dystrophy)    Sleep apnea    NO MACHINE RECOMMENDED   TIA (transient ischemic attack) ~ 2009    Past Surgical History:  Procedure Laterality Date   APPENDECTOMY  ~ 06/2007   BLADDER REPAIR  ~ 06/2007   "same day after bladder lift" (01/24/2013)   BLADDER SUSPENSION  ~ 06/2007   BREAST BIOPSY Left    DIAGNOSTIC LAPAROSCOPY  1990's & ~ 2000   "I've had a couple; for endometrosis" (01/24/2013)   [place] N/A 01/24/2013   Procedure: LAPAROSCOPIC INCISIONAL HERNIA;  Surgeon: Harl Bowie, MD;  Location: [place];  Service: General;  Laterality: N/A;   INSERTION OF MESH N/A 01/24/2013   Procedure: INSERTION OF MESH;  Surgeon: Harl Bowie, MD;  Location: [place];  Service: General;  Laterality: N/A;   LAPAROSCOPIC INCISIONAL / UMBILICAL / [place]  01/24/2013   IHR w/mesh/notes   NASAL SEPTUM SURGERY  1980's?   OOPHORECTOMY Right 2009   TONSILLECTOMY  1990's   VAGINAL HYSTERECTOMY  ` 06/2007    Family History  Problem Relation Age of Onset   Cancer Mother        lung   Hypertension Mother    Cancer Father        colon   Hypertension Sister    Cancer Sister    Cancer Brother    Hyperlipidemia Sister    Birth defects Maternal Grandmother        breast   Breast cancer Maternal Grandmother    Birth defects Paternal Grandmother        uterine, stomach, lung   Breast cancer Paternal Grandmother    Breast cancer Paternal Aunt     Social  History   Socioeconomic History   Marital status: Married    Spouse name: Not on file   Number of children: 2   Years of education: Not on file   Highest education level: Not on file  Occupational History   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  Tobacco Use   Smoking status: Never    Passive exposure: Current   Smokeless tobacco: Never  Vaping Use   Vaping Use: Never used  Substance and Sexual Activity   Alcohol use: Not Currently    Alcohol/week: 0.0 standard drinks of alcohol    Comment: 01/24/2013 "  drink or 2 once or /twice/year, if that"   Drug use: No   Sexual activity: Not Currently  Other Topics Concern   Not on file  Social History Narrative   No living will   Would want husband as POA--then sister, [name]   Would accept resuscitation attempts   Probably would not want tube feeds if cognitively unaware   Social Determinants of Health   Financial Resource Strain: Not on file  Food Insecurity: Not on file  Transportation Needs: Not on file  Physical Activity: Not on file  Stress: Not on file  Social Connections: Not on file  Intimate Partner Violence: Not on file   Review of Systems Appetite is good Has made some adjustments to eating Sleep is "so-so" Wears seat belt Weight is stable Teeth okay--due for dentist No suspicious skin lesions Bowels are slow---now taking metamucil gummies. No blood Voids okay---no incontinence     Objective:   Physical Exam Constitutional:      Appearance: Normal appearance.  HENT:     Mouth/Throat:     Comments: No lesions Eyes:     Conjunctiva/sclera: Conjunctivae normal.     Pupils: Pupils are equal, round, and reactive to light.  Cardiovascular:     Rate and Rhythm: Normal rate and regular rhythm.     Pulses: Normal pulses.     Heart sounds: No murmur heard.    No gallop.  Pulmonary:     Effort: Pulmonary effort is normal.     Breath sounds: Normal breath sounds. No wheezing or rales.  Abdominal:     Palpations:  Abdomen is soft.     Tenderness: There is no abdominal tenderness.  Musculoskeletal:     Cervical back: Neck supple.     Right lower leg: No edema.     Left lower leg: No edema.  Lymphadenopathy:     Cervical: No cervical adenopathy.  Skin:    Findings: No lesion or rash.     Comments: No foot lesions  Neurological:     Mental Status: She is alert and oriented to person, place, and time.     Comments: Mini--cog okay----clock good, recall 2/3 No sensation in left foot, markedly decreased on right  Psychiatric:        Mood and Affect: Mood normal.        Behavior: Behavior normal.            Assessment & Plan:

## 2022-06-23 NOTE — Assessment & Plan Note (Signed)
Reactive depression and anxiety from chronic pain On duloxetine 90 daily (for the nerve pain also)

## 2022-06-23 NOTE — Assessment & Plan Note (Signed)
I have personally reviewed the Medicare Annual Wellness questionnaire and have noted 1. The patient's medical and social history 2. Their use of alcohol, tobacco or illicit drugs 3. Their current medications and supplements 4. The patient's functional ability including ADL's, fall risks, home safety risks and hearing or visual             impairment. 5. Diet and physical activities 6. Evidence for depression or mood disorders  The patients weight, height, BMI and visual acuity have been recorded in the chart I have made referrals, counseling and provided education to the patient based review of the above and I have provided the pt with a written personalized care plan for preventive services.  I have provided you with a copy of your personalized plan for preventive services. Please take the time to review along with your updated medication list.  Colon due in 2025 She will set up her mammogram--due now No pap due to hyster Discussed exercise as able shingrix at the pharmacy Flu vaccine today Still prefers no COVID vaccine

## 2022-06-23 NOTE — Assessment & Plan Note (Signed)
BP Readings from Last 3 Encounters:  06/23/22 124/84  06/01/22 127/84  04/29/22 131/90   Good control on olmesartan 40

## 2022-06-23 NOTE — Assessment & Plan Note (Signed)
Monitored by her pain doctor

## 2022-06-23 NOTE — Progress Notes (Signed)
Hearing Screening - Comments:: May 2023 Vision Screening - Comments:: March 2023

## 2022-06-23 NOTE — Assessment & Plan Note (Signed)
Doing okay with the metformin 1000 daily On Rx for HTN statin

## 2022-06-29 ENCOUNTER — Encounter: Payer: Self-pay | Admitting: Registered Nurse

## 2022-06-29 ENCOUNTER — Encounter: Payer: Medicare HMO | Attending: Registered Nurse | Admitting: Registered Nurse

## 2022-06-29 VITALS — BP 132/82 | HR 92 | Ht 68.0 in | Wt 178.2 lb

## 2022-06-29 DIAGNOSIS — G894 Chronic pain syndrome: Secondary | ICD-10-CM | POA: Insufficient documentation

## 2022-06-29 DIAGNOSIS — Z5181 Encounter for therapeutic drug level monitoring: Secondary | ICD-10-CM | POA: Diagnosis present

## 2022-06-29 DIAGNOSIS — M62838 Other muscle spasm: Secondary | ICD-10-CM | POA: Diagnosis present

## 2022-06-29 DIAGNOSIS — G90512 Complex regional pain syndrome I of left upper limb: Secondary | ICD-10-CM | POA: Insufficient documentation

## 2022-06-29 DIAGNOSIS — G4700 Insomnia, unspecified: Secondary | ICD-10-CM | POA: Insufficient documentation

## 2022-06-29 DIAGNOSIS — F3289 Other specified depressive episodes: Secondary | ICD-10-CM | POA: Insufficient documentation

## 2022-06-29 DIAGNOSIS — Z79891 Long term (current) use of opiate analgesic: Secondary | ICD-10-CM | POA: Diagnosis not present

## 2022-06-29 MED ORDER — TRAMADOL HCL ER 100 MG PO TB24: 100.0000 mg | ORAL_TABLET | Freq: Every day | ORAL | 2 refills | Status: DC

## 2022-06-29 MED ORDER — HYDROCODONE-ACETAMINOPHEN 10-325 MG PO TABS: 1.0000 | ORAL_TABLET | Freq: Two times a day (BID) | ORAL | 0 refills | Status: DC | PRN

## 2022-06-29 MED ORDER — TRAMADOL HCL 50 MG PO TABS: ORAL_TABLET | ORAL | 2 refills | Status: DC

## 2022-06-29 NOTE — Progress Notes (Signed)
Subjective:    Patient ID: [name], female    DOB: Sep 03, 1966, 56 y.o.   MRN: [id number]  HPI: [name] is a 56 y.o. female who returns for follow up appointment for chronic pain and medication refill. She states her pain is located in her left arm and left wrist. She rates her pain 6. Her current exercise regime is walking and performing stretching exercises.  Ms. Krone Morphine equivalent is 60.00 MME.   Last Oral Swab was Performed on 04/29/2022, it was consistent.    Pain Inventory Average Pain 7 Pain Right Now 6 My pain is constant, sharp, burning, stabbing, tingling, and aching  In the last 24 hours, has pain interfered with the following? General activity 6 Relation with others 6 Enjoyment of life 6 What TIME of day is your pain at its worst? morning , daytime, evening, and night Sleep (in general) Poor  Pain is worse with: walking, bending, standing, and some activites Pain improves with: rest, pacing activities, medication, and TENS Relief from Meds: 6  Family History  Problem Relation Age of Onset   Cancer Mother        lung   Hypertension Mother    Cancer Father        colon   Hypertension Sister    Cancer Sister    Cancer Brother    Hyperlipidemia Sister    Birth defects Maternal Grandmother        breast   Breast cancer Maternal Grandmother    Birth defects Paternal Grandmother        uterine, stomach, lung   Breast cancer Paternal Grandmother    Breast cancer Paternal Aunt    Social History   Socioeconomic History   Marital status: Married    Spouse name: Not on file   Number of children: 2   Years of education: Not on file   Highest education level: Not on file  Occupational History   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  Tobacco Use   Smoking status: Never    Passive exposure: Current   Smokeless tobacco: Never  Vaping Use   Vaping Use: Never used  Substance and Sexual Activity   Alcohol use: Not Currently     Alcohol/week: 0.0 standard drinks of alcohol    Comment: 01/24/2013 "drink or 2 once or /twice/year, if that"   Drug use: No   Sexual activity: Not Currently  Other Topics Concern   Not on file  Social History Narrative   No living will   Would want husband as POA--then sister, Neoma Laming   Would accept resuscitation attempts   Probably would not want tube feeds if cognitively unaware   Social Determinants of Health   Financial Resource Strain: Not on file  Food Insecurity: Not on file  Transportation Needs: Not on file  Physical Activity: Not on file  Stress: Not on file  Social Connections: Not on file   Past Surgical History:  Procedure Laterality Date   APPENDECTOMY  ~ 06/2007   BLADDER REPAIR  ~ 06/2007   "same day after bladder lift" (01/24/2013)   BLADDER SUSPENSION  ~ 06/2007   BREAST BIOPSY Left    DIAGNOSTIC LAPAROSCOPY  1990's & ~ 2000   "I've had a couple; for endometrosis" (01/24/2013)   [place] N/A 01/24/2013   Procedure: LAPAROSCOPIC INCISIONAL HERNIA;  Surgeon: Harl Bowie, MD;  Location: [facility];  Service: General;  Laterality: N/A;   INSERTION  OF MESH N/A 01/24/2013   Procedure: INSERTION OF MESH;  Surgeon: Harl Bowie, MD;  Location: [place] [facility];  Service: General;  Laterality: N/A;   LAPAROSCOPIC INCISIONAL / UMBILICAL / [place]  01/24/2013   IHR w/mesh/notes   NASAL SEPTUM SURGERY  1980's?   OOPHORECTOMY Right 2009   TONSILLECTOMY  1990's   VAGINAL HYSTERECTOMY  ` 06/2007   Past Surgical History:  Procedure Laterality Date   APPENDECTOMY  ~ 06/2007   BLADDER REPAIR  ~ 06/2007   "same day after bladder lift" (01/24/2013)   BLADDER SUSPENSION  ~ 06/2007   BREAST BIOPSY Left    DIAGNOSTIC LAPAROSCOPY  1990's & ~ 2000   "I've had a couple; for endometrosis" (01/24/2013)   [place] N/A 01/24/2013   Procedure: LAPAROSCOPIC INCISIONAL HERNIA;  Surgeon: Harl Bowie, MD;  Location:  [place];  Service: General;  Laterality: N/A;   INSERTION OF MESH N/A 01/24/2013   Procedure: INSERTION OF MESH;  Surgeon: Harl Bowie, MD;  Location: [facility];  Service: General;  Laterality: N/A;   LAPAROSCOPIC INCISIONAL / UMBILICAL / [place]  01/24/2013   IHR w/mesh/notes   NASAL SEPTUM SURGERY  1980's?   OOPHORECTOMY Right 2009   TONSILLECTOMY  1990's   VAGINAL HYSTERECTOMY  ` 06/2007   Past Medical History:  Diagnosis Date   Allergy    Anxiety    Arthritis    "just the norm" (01/24/2013)   ASCVD (arteriosclerotic cardiovascular disease)    Asthma    Celiac disease    Daily headache    "depends on the season" (01/24/2013)   Diabetes mellitus without complication (HCC)    Fibromyalgia    GERD (gastroesophageal reflux disease)    History of colonic polyps    Hyperlipidemia    Hypertension    Migraines    Obesity (BMI 30.0-34.9)    RSD (reflex sympathetic dystrophy)    Sleep apnea    NO MACHINE RECOMMENDED   TIA (transient ischemic attack) ~ 2009   BP 132/82   Pulse 92   Ht 5' 8"  (1.727 m)   Wt 178 lb 3.2 oz (80.8 kg)   SpO2 96%   BMI 27.10 kg/m   Opioid Risk Score:   Fall Risk Score:  `1  Depression screen PHQ 2/9     06/01/2022    1:10 PM 04/29/2022    1:49 PM 03/17/2022    1:27 PM 02/11/2022    1:36 PM 12/09/2021    1:22 PM 11/10/2021    1:28 PM 10/06/2021    1:15 PM  Depression screen PHQ 2/9  Decreased Interest 1 1 1  0 1 1 1   Down, Depressed, Hopeless 1 1 1  0 1 1 1   PHQ - 2 Score 2 2 2  0 2 2 2      Review of Systems  Musculoskeletal:        Left arm pain  All other systems reviewed and are negative.     Objective:   Physical Exam Vitals and nursing note reviewed.  Constitutional:      Appearance: Normal appearance.  Neck:     Comments: Cervical Paraspinal Tenderness: C-5-C-6 Mainly Left Side Cardiovascular:     Rate and Rhythm: Normal rate and regular rhythm.     Pulses: Normal pulses.     Heart sounds: Normal heart sounds.   Pulmonary:     Effort: Pulmonary effort is normal.     Breath  sounds: Normal breath sounds.  Musculoskeletal:     Cervical back: Normal range of motion and neck supple.     Comments: Normal Muscle Bulk and Muscle Testing Reveals:  Upper Extremities: Right: Full ROM and Muscle Strength 5/5 Left Upper Extremity: Decreased ROM 45 Degrees and Muscle Strength 2/5 Left AC Joint Tenderness  Lower Extremities: Full ROM and Muscle Strength 5/5 Arises from Table slowly Antalgic  Gait     Skin:    General: Skin is warm and dry.  Neurological:     Mental Status: She is alert and oriented to person, place, and time.  Psychiatric:        Mood and Affect: Mood normal.        Behavior: Behavior normal.         Assessment & Plan:  1.Complex Regional Pain Syndrome Type 1: Continue Topamax and Cymbalta. 06/29/2022. Refilled: Hydro-codone 7.5/325mg one tablet twice a day one in the morning and one at bedtime #60. Continue Tramadol 100 mg ER daily #30 and Tramadol 50 mg one tablet by mouth BID. One tablet in the afternoon and one tablet in the evening. We will continue the opioid monitoring program, this consists of regular clinic visits, examinations, urine drug screen, pill counts as well as use of [place] Controlled Substance Reporting system. A 12 month History has been reviewed on the [place] Controlled Substance Reporting System  On 06/29/2022. 2. Depression: Continue current medication regimen with Cymbalta.06/29/2022 3. Insomnia: Continue current medication regimen with  Trazodone one and half tablet at bedtime  06/29/2022 4. Muscle Spasms: Continue current medication regimen with Tizanidine. 06/29/2022 5. Chronic Pain Syndrome: Continue Ibuprofen BID as needed. 06/29/2022   F/U in 1 month

## 2022-07-07 ENCOUNTER — Other Ambulatory Visit: Payer: Self-pay | Admitting: Internal Medicine

## 2022-07-12 ENCOUNTER — Telehealth: Payer: Self-pay | Admitting: Internal Medicine

## 2022-07-12 NOTE — Telephone Encounter (Signed)
Type of forms received: Jury Summons  Routed DB:[id number] B  Paperwork received by : Gwynn Burly   Individual made aware of 3-5 business day turn around (Y/N): Y  Form completed and patient made aware of charges(Y/N): Y   Faxed to :   Form location: Place in PCP folder

## 2022-07-13 NOTE — Telephone Encounter (Signed)
Left message to call office to see if she is trying to get out of jury duty and what diagnosis is she wanting to use. Im thinking her pain doctor may need to be the one who writes it. I will have to get with Dr Silvio Pate.

## 2022-07-13 NOTE — Telephone Encounter (Signed)
Letter written and sent placed in Dr Alla German inbox on his desk to sign.

## 2022-07-13 NOTE — Telephone Encounter (Signed)
Spoke to pt. She needs it for the Complex Pain Syndrome. Says she believes the last time it was written, it was requested to remove her from being summons permanently. Not sure why that did not happen. Form placed in Dr Alla German inbox on his desk.

## 2022-07-14 ENCOUNTER — Other Ambulatory Visit: Payer: Self-pay | Admitting: Internal Medicine

## 2022-07-14 NOTE — Telephone Encounter (Signed)
Left message on VM that letter is ready for pickup up front and there is a $29 charge.

## 2022-07-21 ENCOUNTER — Other Ambulatory Visit: Payer: Self-pay | Admitting: Physical Medicine & Rehabilitation

## 2022-07-27 ENCOUNTER — Encounter: Payer: Medicare HMO | Attending: Registered Nurse | Admitting: Registered Nurse

## 2022-07-27 VITALS — BP 135/82 | HR 90 | Ht 68.0 in | Wt 177.8 lb

## 2022-07-27 DIAGNOSIS — G894 Chronic pain syndrome: Secondary | ICD-10-CM

## 2022-07-27 DIAGNOSIS — F3289 Other specified depressive episodes: Secondary | ICD-10-CM

## 2022-07-27 DIAGNOSIS — Z5181 Encounter for therapeutic drug level monitoring: Secondary | ICD-10-CM

## 2022-07-27 DIAGNOSIS — M62838 Other muscle spasm: Secondary | ICD-10-CM

## 2022-07-27 DIAGNOSIS — G90512 Complex regional pain syndrome I of left upper limb: Secondary | ICD-10-CM | POA: Diagnosis present

## 2022-07-27 DIAGNOSIS — Z79891 Long term (current) use of opiate analgesic: Secondary | ICD-10-CM

## 2022-07-27 DIAGNOSIS — G4700 Insomnia, unspecified: Secondary | ICD-10-CM | POA: Diagnosis present

## 2022-07-27 MED ORDER — HYDROCODONE-ACETAMINOPHEN 10-325 MG PO TABS: 1.0000 | ORAL_TABLET | Freq: Two times a day (BID) | ORAL | 0 refills | Status: DC | PRN

## 2022-07-27 NOTE — Progress Notes (Unsigned)
Subjective:    Patient ID: [name], female    DOB: 1965-11-12, 56 y.o.   MRN: [id number]  HPI: [name] is a 56 y.o. female who returns for follow up appointment for chronic pain and medication refill. She states her pain is located in her left hand and left wrist. She rates her pain 7. Her current exercise regime is walking short distances  and performing stretching exercises.  [name] Morphine equivalent is 60.00 MME.   Last Oral Swab was Performed on 04/29/2022, it was consistent.    Pain Inventory Average Pain 7 Pain Right Now 7 My pain is sharp, burning, stabbing, tingling, and aching  In the last 24 hours, has pain interfered with the following? General activity 5 Relation with others 5 Enjoyment of life 5 What TIME of day is your pain at its worst? evening and night Sleep (in general) Poor  Pain is worse with: some activites Pain improves with: medication Relief from Meds: 6  Family History  Problem Relation Age of Onset  . Cancer Mother        lung  . Hypertension Mother   . Cancer Father        colon  . Hypertension Sister   . Cancer Sister   . Cancer Brother   . Hyperlipidemia Sister   . Birth defects Maternal Grandmother        breast  . Breast cancer Maternal Grandmother   . Birth defects Paternal Grandmother        uterine, stomach, lung  . Breast cancer Paternal Grandmother   . Breast cancer Paternal Aunt    Social History   Socioeconomic History  . Marital status: Married    Spouse name: Not on file  . Number of children: 2  . Years of education: Not on file  . Highest education level: Not on file  Occupational History  . 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 Smoking status: Never    Passive exposure: Current  . Smokeless tobacco: Never  Vaping Use  . Vaping Use: Never used  Substance and Sexual Activity  . Alcohol use: Not Currently    Alcohol/week: 0.0 standard drinks of alcohol     Comment: 01/24/2013 "drink or 2 once or /twice/year, if that"  . Drug use: No  . Sexual activity: Not Currently  Other Topics Concern  . Not on file  Social History Narrative   No living will   Would want husband as POA--then sister, Neoma Laming   Would accept resuscitation attempts   Probably would not want tube feeds if cognitively unaware   Social Determinants of Health   Financial Resource Strain: Not on file  Food Insecurity: Not on file  Transportation Needs: Not on file  Physical Activity: Not on file  Stress: Not on file  Social Connections: Not on file   Past Surgical History:  Procedure Laterality Date  . APPENDECTOMY  ~ 06/2007  . BLADDER REPAIR  ~ 06/2007   "same day after bladder lift" (01/24/2013)  . BLADDER SUSPENSION  ~ 06/2007  . BREAST BIOPSY Left   . DIAGNOSTIC LAPAROSCOPY  1990's & ~ 2000   "I've had a couple; for endometrosis" (01/24/2013)  . HERNIA REPAIR    . INCISIONAL HERNIA REPAIR N/A 01/24/2013   Procedure: LAPAROSCOPIC INCISIONAL HERNIA;  Surgeon: Harl Bowie, MD;  Location: [facility];  Service: General;  Laterality: N/A;  . INSERTION OF MESH N/A 01/24/2013   Procedure: INSERTION OF MESH;  Surgeon: Harl Bowie, MD;  Location: [facility];  Service: General;  Laterality: N/A;  . LAPAROSCOPIC INCISIONAL / UMBILICAL / [place]  01/24/2013   IHR w/mesh/notes  . NASAL SEPTUM SURGERY  1980's?  . OOPHORECTOMY Right 2009  . TONSILLECTOMY  1990's  . VAGINAL HYSTERECTOMY  ` 06/2007   Past Surgical History:  Procedure Laterality Date  . APPENDECTOMY  ~ 06/2007  . BLADDER REPAIR  ~ 06/2007   "same day after bladder lift" (01/24/2013)  . BLADDER SUSPENSION  ~ 06/2007  . BREAST BIOPSY Left   . DIAGNOSTIC LAPAROSCOPY  1990's & ~ 2000   "I've had a couple; for endometrosis" (01/24/2013)  . HERNIA REPAIR    . INCISIONAL HERNIA REPAIR N/A 01/24/2013   Procedure: LAPAROSCOPIC INCISIONAL HERNIA;  Surgeon: Harl Bowie, MD;  Location: [facility];  Service: General;   Laterality: N/A;  . INSERTION OF MESH N/A 01/24/2013   Procedure: INSERTION OF MESH;  Surgeon: Harl Bowie, MD;  Location: [facility];  Service: General;  Laterality: N/A;  . LAPAROSCOPIC INCISIONAL / UMBILICAL / [place]  01/24/2013   IHR w/mesh/notes  . NASAL SEPTUM SURGERY  1980's?  . OOPHORECTOMY Right 2009  . TONSILLECTOMY  1990's  . VAGINAL HYSTERECTOMY  ` 06/2007   Past Medical History:  Diagnosis Date  . Allergy   . Anxiety   . Arthritis    "just the norm" (01/24/2013)  . ASCVD (arteriosclerotic cardiovascular disease)   . Asthma   . Celiac disease   . Daily headache    "depends on the season" (01/24/2013)  . Diabetes mellitus without complication ([place])   . Fibromyalgia   . GERD (gastroesophageal reflux disease)   . History of colonic polyps   . Hyperlipidemia   . Hypertension   . Migraines   . Obesity (BMI 30.0-34.9)   . RSD (reflex sympathetic dystrophy)   . Sleep apnea    NO MACHINE RECOMMENDED  . TIA (transient ischemic attack) ~ 2009   BP 135/82   Pulse 90   Ht 5' 8"  (1.727 m)   Wt 177 lb 12.8 oz (80.6 kg)   SpO2 93%   BMI 27.03 kg/m   Opioid Risk Score:   Fall Risk Score:  `1  Depression screen PHQ 2/9     06/01/2022    1:10 PM 04/29/2022    1:49 PM 03/17/2022    1:27 PM 02/11/2022    1:36 PM 12/09/2021    1:22 PM 11/10/2021    1:28 PM 10/06/2021    1:15 PM  Depression screen PHQ 2/9  Decreased Interest 1 1 1  0 1 1 1   Down, Depressed, Hopeless 1 1 1  0 1 1 1   PHQ - 2 Score 2 2 2  0 2 2 2       Review of Systems  Musculoskeletal:        Left hand pain  All other systems reviewed and are negative.     Objective:   Physical Exam Vitals and nursing note reviewed.  Constitutional:      Appearance: Normal appearance.  Cardiovascular:     Rate and Rhythm: Normal rate and regular rhythm.     Pulses: Normal pulses.     Heart sounds: Normal heart sounds.  Pulmonary:     Effort: Pulmonary effort is normal.     Breath sounds: Normal  breath sounds.  Musculoskeletal:     Cervical back: Normal range of motion and neck supple.  Comments: Normal Muscle Bulk and Muscle Testing Reveals:  Upper Extremities: Right: Full ROM and Muscle Strength on Right 5/5 and Left Upper Extremity: Decreased ROM and Muscle Strength Left 3/5   Thoracic Paraspinal Tenderness: T-1- T-3 Mainly Left Side Lumbar Paraspinal Tenderness: L-4-L-5 Lower Extremities: Decreased ROM and Muscle Strength 4/5 Bilateral Lower Extremity Flexion Produces Pain into her Bilateral Lower Extremities and Bilateral Feet Arises from Table Slowly  Antalgic  Gait     Skin:    General: Skin is warm and dry.  Neurological:     Mental Status: She is alert and oriented to person, place, and time.  Psychiatric:        Mood and Affect: Mood normal.        Behavior: Behavior normal.         Assessment & Plan:  1.Complex Regional Pain Syndrome Type 1: Continue Topamax and Cymbalta. 07/28/2022. Refilled: Hydro-codone 7.5/325mg one tablet twice a day one in the morning and one at bedtime #60. Continue Tramadol 100 mg ER daily #30 and Tramadol 50 mg one tablet by mouth BID. One tablet in the afternoon and one tablet in the evening. We will continue the opioid monitoring program, this consists of regular clinic visits, examinations, urine drug screen, pill counts as well as use of [place] Controlled Substance Reporting system. A 12 month History has been reviewed on the [place] Controlled Substance Reporting System  On 07/28/2022. 2. Depression: Continue current medication regimen with Cymbalta.07/28/2022 3. Insomnia: Continue current medication regimen with  Trazodone one and half tablet at bedtime  07/28/2022 4. Muscle Spasms: Continue current medication regimen with Tizanidine. 07/28/2022 5. Chronic Pain Syndrome: Continue Ibuprofen BID as needed. 07/28/2022   F/U in 1 month

## 2022-07-28 ENCOUNTER — Encounter: Payer: Self-pay | Admitting: Registered Nurse

## 2022-07-29 ENCOUNTER — Telehealth: Payer: Self-pay

## 2022-07-29 NOTE — Telephone Encounter (Signed)
PMP was Reviewed.  [facility] was called and spoke with pharmacist. [name] was called, she was instructed to call Pharmacy on Tuesday on 08/03/2022, to see if they have any Hydrocodone. It's too early today. She verbalizes understanding.

## 2022-07-29 NOTE — Telephone Encounter (Signed)
Patient state has #21 Hydrocodone 5 MG on hand. The Pharmacy now has the rest of the Rx. Please [facility] in [facility]  [facility] [place] . Call back phone [phone].  PMP  Filled  Written  ID  Drug  QTY  Days  Prescriber  RX #  Dispenser  Refill  Daily Dose*  Pymt Type  PMP  07/27/2022 06/29/2022 1  Tramadol Hcl Er 100 Mg Tablet 30.00 30 Eu Tho [id number] Nor (2918) 1/2 20.00 MME Medicare [place] 07/27/2022 06/29/2022 1  Tramadol Hcl 50 Mg Tablet 60.00 30 Eu Tho [id number] Nor (2918) 1/2 20.00 MME Medicare [facility] 07/27/2022 07/27/2022 1  Hydrocodone-Acetamin 10-325 Mg 20.00 10 Eu Tho [id number] Nor (2918) 0/0 20.00 MME Medicare [place]

## 2022-08-02 ENCOUNTER — Telehealth: Payer: Self-pay

## 2022-08-02 MED ORDER — HYDROCODONE-ACETAMINOPHEN 5-325 MG PO TABS: 2.0000 | ORAL_TABLET | Freq: Two times a day (BID) | ORAL | 0 refills | Status: DC | PRN

## 2022-08-02 NOTE — Telephone Encounter (Signed)
PMP was Reviewed.  Hydrocodone 10/325 out of stock. National Shortage  Hydrocodone 5/325 e- scribed today. Call placed to [name], she verbalizes understanding.

## 2022-08-02 NOTE — Telephone Encounter (Signed)
Pt states she found Hydrocodone at [facility].

## 2022-08-24 ENCOUNTER — Encounter: Payer: Worker's Compensation | Attending: Registered Nurse | Admitting: Registered Nurse

## 2022-08-24 ENCOUNTER — Encounter: Payer: Self-pay | Admitting: Registered Nurse

## 2022-08-24 VITALS — Ht 68.0 in

## 2022-08-24 DIAGNOSIS — Z79891 Long term (current) use of opiate analgesic: Secondary | ICD-10-CM | POA: Diagnosis not present

## 2022-08-24 DIAGNOSIS — G90512 Complex regional pain syndrome I of left upper limb: Secondary | ICD-10-CM | POA: Diagnosis not present

## 2022-08-24 DIAGNOSIS — Z5181 Encounter for therapeutic drug level monitoring: Secondary | ICD-10-CM

## 2022-08-24 DIAGNOSIS — M62838 Other muscle spasm: Secondary | ICD-10-CM

## 2022-08-24 DIAGNOSIS — G894 Chronic pain syndrome: Secondary | ICD-10-CM | POA: Diagnosis not present

## 2022-08-24 DIAGNOSIS — G4700 Insomnia, unspecified: Secondary | ICD-10-CM

## 2022-08-24 DIAGNOSIS — F3289 Other specified depressive episodes: Secondary | ICD-10-CM

## 2022-08-24 MED ORDER — TRAMADOL HCL 50 MG PO TABS: ORAL_TABLET | ORAL | 2 refills | Status: DC

## 2022-08-24 MED ORDER — TRAMADOL HCL ER 100 MG PO TB24: 100.0000 mg | ORAL_TABLET | Freq: Every day | ORAL | 2 refills | Status: DC

## 2022-08-24 MED ORDER — HYDROCODONE-ACETAMINOPHEN 5-325 MG PO TABS: 2.0000 | ORAL_TABLET | Freq: Two times a day (BID) | ORAL | 0 refills | Status: DC | PRN

## 2022-08-24 NOTE — Progress Notes (Signed)
Subjective:    Patient ID: [name], female    DOB: November 29, 1965, 56 y.o.   MRN: [id number]  HPI: [name] is a 56 y.o. female who is scheduled for My- Chart visit, her granddaughter has COVID. We have  discussed the limitations of evaluation and management by telemedicine and the availability of in person appointments. The patient expressed understanding and agreed to proceed  She states her pain is located in her left hand and left wrist. She rates her pain 8. Her current exercise regime is walking and performing stretching exercises.  [name] Morphine equivalent is 60.00 MME.   Last Oral Swab was Performed on 04/29/2022, it was consistent.      Pain Inventory Average Pain 7 Pain Right Now 8 My pain is constant, sharp, burning, dull, stabbing, tingling, and aching  In the last 24 hours, has pain interfered with the following? General activity 8 Relation with others 0 Enjoyment of life 0 What TIME of day is your pain at its worst? morning , daytime, evening, night, and varies Sleep (in general) Poor  Pain is worse with: walking, bending, sitting, inactivity, standing, unsure, and some activites Pain improves with: rest, medication, and heat Relief from Meds: 6  Family History  Problem Relation Age of Onset   Cancer Mother        lung   Hypertension Mother    Cancer Father        colon   Hypertension Sister    Cancer Sister    Cancer Brother    Hyperlipidemia Sister    Birth defects Maternal Grandmother        breast   Breast cancer Maternal Grandmother    Birth defects Paternal Grandmother        uterine, stomach, lung   Breast cancer Paternal Grandmother    Breast cancer Paternal Aunt    Social History   Socioeconomic History   Marital status: Married    Spouse name: Not on file   Number of children: 2   Years of education: Not on file   Highest education level: Not on file  Occupational History   Occupation: Disabled due to  RSD  Tobacco Use   Smoking status: Never    Passive exposure: Current   Smokeless tobacco: Never  Vaping Use   Vaping Use: Never used  Substance and Sexual Activity   Alcohol use: Not Currently    Alcohol/week: 0.0 standard drinks of alcohol    Comment: 01/24/2013 "drink or 2 once or /twice/year, if that"   Drug use: No   Sexual activity: Not Currently  Other Topics Concern   Not on file  Social History Narrative   No living will   Would want husband as POA--then sister, Neoma Laming   Would accept resuscitation attempts   Probably would not want tube feeds if cognitively unaware   Social Determinants of Health   Financial Resource Strain: Not on file  Food Insecurity: Not on file  Transportation Needs: Not on file  Physical Activity: Not on file  Stress: Not on file  Social Connections: Not on file   Past Surgical History:  Procedure Laterality Date   APPENDECTOMY  ~ 06/2007   BLADDER REPAIR  ~ 06/2007   "same day after bladder lift" (01/24/2013)   BLADDER SUSPENSION  ~ 06/2007   BREAST BIOPSY Left    DIAGNOSTIC LAPAROSCOPY  1990's & ~ 2000   "I've had a couple; for endometrosis" (01/24/2013)   HERNIA REPAIR  INCISIONAL HERNIA REPAIR N/A 01/24/2013   Procedure: LAPAROSCOPIC INCISIONAL HERNIA;  Surgeon: Harl Bowie, MD;  Location: [facility];  Service: General;  Laterality: N/A;   INSERTION OF MESH N/A 01/24/2013   Procedure: INSERTION OF MESH;  Surgeon: Harl Bowie, MD;  Location: [facility];  Service: General;  Laterality: N/A;   LAPAROSCOPIC INCISIONAL / UMBILICAL / [place]  01/24/2013   IHR w/mesh/notes   NASAL SEPTUM SURGERY  1980's?   OOPHORECTOMY Right 2009   TONSILLECTOMY  1990's   VAGINAL HYSTERECTOMY  ` 06/2007   Past Surgical History:  Procedure Laterality Date   APPENDECTOMY  ~ 06/2007   BLADDER REPAIR  ~ 06/2007   "same day after bladder lift" (01/24/2013)   BLADDER SUSPENSION  ~ 06/2007   BREAST BIOPSY Left    DIAGNOSTIC LAPAROSCOPY  1990's & ~ 2000    "I've had a couple; for endometrosis" (01/24/2013)   [place] N/A 01/24/2013   Procedure: LAPAROSCOPIC INCISIONAL HERNIA;  Surgeon: Harl Bowie, MD;  Location: [facility];  Service: General;  Laterality: N/A;   INSERTION OF MESH N/A 01/24/2013   Procedure: INSERTION OF MESH;  Surgeon: Harl Bowie, MD;  Location: [place] [facility];  Service: General;  Laterality: N/A;   LAPAROSCOPIC INCISIONAL / UMBILICAL / [place]  01/24/2013   IHR w/mesh/notes   NASAL SEPTUM SURGERY  1980's?   OOPHORECTOMY Right 2009   TONSILLECTOMY  1990's   VAGINAL HYSTERECTOMY  ` 06/2007   Past Medical History:  Diagnosis Date   Allergy    Anxiety    Arthritis    "just the norm" (01/24/2013)   ASCVD (arteriosclerotic cardiovascular disease)    Asthma    Celiac disease    Daily headache    "depends on the season" (01/24/2013)   Diabetes mellitus without complication (HCC)    Fibromyalgia    GERD (gastroesophageal reflux disease)    History of colonic polyps    Hyperlipidemia    Hypertension    Migraines    Obesity (BMI 30.0-34.9)    RSD (reflex sympathetic dystrophy)    Sleep apnea    NO MACHINE RECOMMENDED   TIA (transient ischemic attack) ~ 2009   There were no vitals taken for this visit.  Opioid Risk Score:   Fall Risk Score:  `1  Depression screen PHQ 2/9     06/01/2022    1:10 PM 04/29/2022    1:49 PM 03/17/2022    1:27 PM 02/11/2022    1:36 PM 12/09/2021    1:22 PM 11/10/2021    1:28 PM 10/06/2021    1:15 PM  Depression screen PHQ 2/9  Decreased Interest 1 1 1  0 1 1 1   Down, Depressed, Hopeless 1 1 1  0 1 1 1   PHQ - 2 Score 2 2 2  0 2 2 2     Review of Systems  HENT:  Positive for ear pain.   Musculoskeletal:        Left wrist, arm & hand pain  All other systems reviewed and are negative.      Objective:   Physical Exam Vitals and nursing note reviewed.  Musculoskeletal:     Comments: No Physical Exam Performed My Chart Visit          Assessment & Plan:  1.Complex Regional Pain Syndrome Type 1: Continue Topamax and Cymbalta. 08/24/2022. Refilled: Hydro-codone 7.5/325mg one tablet twice a day one in the morning and one at  bedtime #60. Continue Tramadol 100 mg ER daily #30 and Tramadol 50 mg one tablet by mouth BID. One tablet in the afternoon and one tablet in the evening. We will continue the opioid monitoring program, this consists of regular clinic visits, examinations, urine drug screen, pill counts as well as use of [place] Controlled Substance Reporting system. A 12 month History has been reviewed on the [place] Controlled Substance Reporting System  On 08/24/2022. 2. Depression: Continue current medication regimen with Cymbalta.08/24/2022 3. Insomnia: Continue current medication regimen with  Trazodone one and half tablet at bedtime  08/24/2022 4. Muscle Spasms: Continue current medication regimen with Tizanidine. 08/24/2022 5. Chronic Pain Syndrome: Continue Ibuprofen BID as needed. 08/24/2022   F/U in 1 month  My Chart Visit Establish Patient Location of Patient in her Home Location of Provider: In the office

## 2022-09-04 ENCOUNTER — Other Ambulatory Visit: Payer: Self-pay | Admitting: Internal Medicine

## 2022-09-18 DIAGNOSIS — Z86010 Personal history of colonic polyps: Secondary | ICD-10-CM | POA: Diagnosis not present

## 2022-09-18 DIAGNOSIS — K2270 Barrett's esophagus without dysplasia: Secondary | ICD-10-CM | POA: Diagnosis not present

## 2022-09-18 DIAGNOSIS — K219 Gastro-esophageal reflux disease without esophagitis: Secondary | ICD-10-CM | POA: Diagnosis not present

## 2022-09-21 ENCOUNTER — Encounter: Payer: Worker's Compensation | Admitting: Registered Nurse

## 2022-09-21 ENCOUNTER — Telehealth: Payer: Self-pay | Admitting: Registered Nurse

## 2022-09-21 MED ORDER — HYDROCODONE-ACETAMINOPHEN 5-325 MG PO TABS: 2.0000 | ORAL_TABLET | Freq: Two times a day (BID) | ORAL | 0 refills | Status: DC | PRN

## 2022-09-21 NOTE — Telephone Encounter (Signed)
PMP was Reviewed.  Hydrocodone e-scribed to pharmacy.  [name] is aware

## 2022-09-29 ENCOUNTER — Other Ambulatory Visit: Payer: Self-pay | Admitting: Registered Nurse

## 2022-10-15 ENCOUNTER — Other Ambulatory Visit: Payer: Self-pay | Admitting: Registered Nurse

## 2022-10-22 ENCOUNTER — Encounter: Payer: Worker's Compensation | Attending: Registered Nurse | Admitting: Registered Nurse

## 2022-10-22 ENCOUNTER — Encounter: Payer: Self-pay | Admitting: Registered Nurse

## 2022-10-22 VITALS — BP 130/76 | HR 88 | Ht 68.0 in | Wt 172.0 lb

## 2022-10-22 DIAGNOSIS — G894 Chronic pain syndrome: Secondary | ICD-10-CM

## 2022-10-22 DIAGNOSIS — M62838 Other muscle spasm: Secondary | ICD-10-CM

## 2022-10-22 DIAGNOSIS — Z79891 Long term (current) use of opiate analgesic: Secondary | ICD-10-CM | POA: Diagnosis present

## 2022-10-22 DIAGNOSIS — G90512 Complex regional pain syndrome I of left upper limb: Secondary | ICD-10-CM

## 2022-10-22 DIAGNOSIS — F3289 Other specified depressive episodes: Secondary | ICD-10-CM

## 2022-10-22 DIAGNOSIS — Z5181 Encounter for therapeutic drug level monitoring: Secondary | ICD-10-CM

## 2022-10-22 DIAGNOSIS — G4700 Insomnia, unspecified: Secondary | ICD-10-CM

## 2022-10-22 MED ORDER — TRAMADOL HCL ER 100 MG PO TB24: 100.0000 mg | ORAL_TABLET | Freq: Every day | ORAL | 2 refills | Status: DC

## 2022-10-22 MED ORDER — HYDROCODONE-ACETAMINOPHEN 5-325 MG PO TABS: 2.0000 | ORAL_TABLET | Freq: Two times a day (BID) | ORAL | 0 refills | Status: DC | PRN

## 2022-10-22 MED ORDER — TRAMADOL HCL 50 MG PO TABS: ORAL_TABLET | ORAL | 2 refills | Status: DC

## 2022-10-22 NOTE — Progress Notes (Unsigned)
Subjective:    Patient ID: [name], female    DOB: Nov 22, 1965, 57 y.o.   MRN: [id number]  HPI: [name] is a 57 y.o. female who returns for follow up appointment for chronic pain and medication refill. states *** pain is located in  ***. rates pain ***. current exercise regime is walking and performing stretching exercises.  [name] Morphine equivalent is *** MME.   Oral Swab ordered today.     Pain Inventory Average Pain 8 Pain Right Now 7 My pain is sharp, burning, stabbing, tingling, and aching  In the last 24 hours, has pain interfered with the following? General activity 3 Relation with others 3 Enjoyment of life 3 What TIME of day is your pain at its worst? morning , daytime, evening, and night Sleep (in general) Poor  Pain is worse with: walking, bending, sitting, standing, and some activites Pain improves with: rest, heat/ice, pacing activities, and medication Relief from Meds: 6  Family History  Problem Relation Age of Onset   Cancer Mother        lung   Hypertension Mother    Cancer Father        colon   Hypertension Sister    Cancer Sister    Cancer Brother    Hyperlipidemia Sister    Birth defects Maternal Grandmother        breast   Breast cancer Maternal Grandmother    Birth defects Paternal Grandmother        uterine, stomach, lung   Breast cancer Paternal Grandmother    Breast cancer Paternal Aunt    Social History   Socioeconomic History   Marital status: Married    Spouse name: Not on file   Number of children: 2   Years of education: Not on file   Highest education level: Not on file  Occupational History   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  Tobacco Use   Smoking status: Never    Passive exposure: Current   Smokeless tobacco: Never  Vaping Use   Vaping Use: Never used  Substance and Sexual Activity   Alcohol use: Not Currently    Alcohol/week: 0.0 standard drinks of alcohol    Comment: 01/24/2013  "drink or 2 once or /twice/year, if that"   Drug use: No   Sexual activity: Not Currently  Other Topics Concern   Not on file  Social History Narrative   No living will   Would want husband as POA--then sister, Neoma Laming   Would accept resuscitation attempts   Probably would not want tube feeds if cognitively unaware   Social Determinants of Health   Financial Resource Strain: Not on file  Food Insecurity: Not on file  Transportation Needs: Not on file  Physical Activity: Not on file  Stress: Not on file  Social Connections: Not on file   Past Surgical History:  Procedure Laterality Date   APPENDECTOMY  ~ 06/2007   BLADDER REPAIR  ~ 06/2007   "same day after bladder lift" (01/24/2013)   BLADDER SUSPENSION  ~ 06/2007   BREAST BIOPSY Left    DIAGNOSTIC LAPAROSCOPY  1990's & ~ 2000   "I've had a couple; for endometrosis" (01/24/2013)   [place] N/A 01/24/2013   Procedure: LAPAROSCOPIC INCISIONAL HERNIA;  Surgeon: Harl Bowie, MD;  Location: [place] [facility];  Service: General;  Laterality: N/A;   INSERTION OF MESH N/A 01/24/2013   Procedure: INSERTION OF MESH;  Surgeon: Nathaneil Canary  Lynann Beaver, MD;  Location: [facility];  Service: General;  Laterality: N/A;   LAPAROSCOPIC INCISIONAL / UMBILICAL / [place] HERNIA REPAIR  01/24/2013   IHR w/mesh/notes   NASAL SEPTUM SURGERY  1980's?   OOPHORECTOMY Right 2009   TONSILLECTOMY  1990's   VAGINAL HYSTERECTOMY  ` 06/2007   Past Surgical History:  Procedure Laterality Date   APPENDECTOMY  ~ 06/2007   BLADDER REPAIR  ~ 06/2007   "same day after bladder lift" (01/24/2013)   BLADDER SUSPENSION  ~ 06/2007   BREAST BIOPSY Left    DIAGNOSTIC LAPAROSCOPY  1990's & ~ 2000   "I've had a couple; for endometrosis" (01/24/2013)   [place] N/A 01/24/2013   Procedure: LAPAROSCOPIC INCISIONAL HERNIA;  Surgeon: Harl Bowie, MD;  Location: [facility];  Service: General;  Laterality: N/A;   INSERTION OF MESH N/A  01/24/2013   Procedure: INSERTION OF MESH;  Surgeon: Harl Bowie, MD;  Location: [place];  Service: General;  Laterality: N/A;   LAPAROSCOPIC INCISIONAL / UMBILICAL / [place]  01/24/2013   IHR w/mesh/notes   NASAL SEPTUM SURGERY  1980's?   OOPHORECTOMY Right 2009   TONSILLECTOMY  1990's   VAGINAL HYSTERECTOMY  ` 06/2007   Past Medical History:  Diagnosis Date   Allergy    Anxiety    Arthritis    "just the norm" (01/24/2013)   ASCVD (arteriosclerotic cardiovascular disease)    Asthma    Celiac disease    Daily headache    "depends on the season" (01/24/2013)   Diabetes mellitus without complication (HCC)    Fibromyalgia    GERD (gastroesophageal reflux disease)    History of colonic polyps    Hyperlipidemia    Hypertension    Migraines    Obesity (BMI 30.0-34.9)    RSD (reflex sympathetic dystrophy)    Sleep apnea    NO MACHINE RECOMMENDED   TIA (transient ischemic attack) ~ 2009   Ht 5\' 8"  (1.727 m)   BMI 27.03 kg/m   Opioid Risk Score:   Fall Risk Score:  `1  Depression screen PHQ 2/9     08/24/2022   12:52 PM 06/01/2022    1:10 PM 04/29/2022    1:49 PM 03/17/2022    1:27 PM 02/11/2022    1:36 PM 12/09/2021    1:22 PM 11/10/2021    1:28 PM  Depression screen PHQ 2/9  Decreased Interest 1 1 1 1  0 1 1  Down, Depressed, Hopeless 1 1 1 1  0 1 1  PHQ - 2 Score 2 2 2 2  0 2 2     Review of Systems  Musculoskeletal:        Left arm pain  All other systems reviewed and are negative.     Objective:   Physical Exam        Assessment & Plan:  1.Complex Regional Pain Syndrome Type 1: Continue Topamax and Cymbalta. 08/24/2022. Refilled: Hydro-codone 7.5/325mg  one tablet twice a day one in the morning and one at bedtime #60. Continue Tramadol 100 mg ER daily #30 and Tramadol 50 mg one tablet by mouth BID. One tablet in the afternoon and one tablet in the evening. We will continue the opioid monitoring program, this consists of regular clinic visits,  examinations, urine drug screen, pill counts as well as use of [place] Controlled Substance Reporting system. A 12 month History has been reviewed on the [place] Controlled Substance  Reporting System  On 08/24/2022. 2. Depression: Continue current medication regimen with Cymbalta.08/24/2022 3. Insomnia: Continue current medication regimen with  Trazodone one and half tablet at bedtime  08/24/2022 4. Muscle Spasms: Continue current medication regimen with Tizanidine. 08/24/2022 5. Chronic Pain Syndrome: Continue Ibuprofen BID as needed. 08/24/2022   F/U in 1 month

## 2022-10-25 ENCOUNTER — Ambulatory Visit (INDEPENDENT_AMBULATORY_CARE_PROVIDER_SITE_OTHER): Payer: Medicare HMO

## 2022-10-25 ENCOUNTER — Encounter: Payer: Self-pay | Admitting: Emergency Medicine

## 2022-10-25 ENCOUNTER — Ambulatory Visit
Admission: EM | Admit: 2022-10-25 | Discharge: 2022-10-25 | Disposition: A | Discharge status: Home / Self Care | Payer: Medicare HMO | Attending: Emergency Medicine | Admitting: Emergency Medicine

## 2022-10-25 DIAGNOSIS — R0602 Shortness of breath: Secondary | ICD-10-CM

## 2022-10-25 DIAGNOSIS — R059 Cough, unspecified: Secondary | ICD-10-CM | POA: Diagnosis not present

## 2022-10-25 DIAGNOSIS — J029 Acute pharyngitis, unspecified: Secondary | ICD-10-CM

## 2022-10-25 MED ORDER — FLUTICASONE PROPIONATE 50 MCG/ACT NA SUSP: 2.0000 | Freq: Every day | NASAL | 2 refills | Status: DC

## 2022-10-25 MED ORDER — PREDNISONE 10 MG (21) PO TBPK: ORAL_TABLET | Freq: Every day | ORAL | 0 refills | Status: DC

## 2022-10-25 MED ORDER — KETOROLAC TROMETHAMINE 30 MG/ML IJ SOLN
30.0000 mg | Freq: Once | INTRAMUSCULAR | Status: AC
  Administered 2022-10-25: 30 mg via INTRAMUSCULAR

## 2022-10-25 NOTE — ED Triage Notes (Signed)
Patient c/o sore throat, headache, neck pain and teeth pain x 2 weeks.  Patient has been taken Chlorcedin.

## 2022-10-25 NOTE — Discharge Instructions (Addendum)
Continue to use throat lozenges and Chloraseptic spray as needed Take Tylenol Motrin as needed for pain or fever Cont to take your daily allergy medications

## 2022-10-25 NOTE — ED Provider Notes (Signed)
[facility]    CSN: [id number] Arrival date & time: 10/25/22  1334      History   Chief Complaint Chief Complaint  Patient presents with   Sore Throat    HPI [name] is a 57 y.o. female.   Patient presents today with sore throat nasal congestion and short of breath while walking short distances.  She had the flu 2 weeks ago and has not felt well since.  Patient has been taken over-the-counter medications with no relief.  Patient denies any chest pain no nausea vomiting or diarrhea.  Denies any fevers just hot flashes and chills intermittently.  Patient does complain of intermittent headache generalized all over.    Past Medical History:  Diagnosis Date   Allergy    Anxiety    Arthritis    "just the norm" (01/24/2013)   ASCVD (arteriosclerotic cardiovascular disease)    Asthma    Celiac disease    Daily headache    "depends on the season" (01/24/2013)   Diabetes mellitus without complication (HCC)    Fibromyalgia    GERD (gastroesophageal reflux disease)    History of colonic polyps    Hyperlipidemia    Hypertension    Migraines    Obesity (BMI 30.0-34.9)    RSD (reflex sympathetic dystrophy)    Sleep apnea    NO MACHINE RECOMMENDED   TIA (transient ischemic attack) ~ 2009    Patient Active Problem List   Diagnosis Date Noted   Herpes zoster without complication 12/25/2021   Allergic reaction caused by a drug 11/11/2021   Type 2 diabetes mellitus with circulatory disorder, without long-term current use of insulin (HCC) 07/22/2021   MCI (mild cognitive impairment) 06/19/2021   Chronic narcotic dependence (HCC) 05/22/2019   Celiac disease    Advance directive discussed with patient 05/02/2017   RSD upper limb 01/27/2015   Reflex sympathetic dystrophy of lower extremity 09/26/2014   Swelling of limb 04/18/2014   Peripheral autonomic neuropathy 08/03/2013   Urinary incontinence 04/19/2013   Routine general medical examination at a health  care facility 08/02/2011   MIGRAINE VARIANT 12/21/2007   Essential hypertension, benign 06/30/2007   Non-seasonal allergic rhinitis due to pollen 06/30/2007   Mood disorder (HCC) 03/24/2007   RSD lower limb 03/24/2007   Mild intermittent asthma in adult without complication 03/24/2007   GERD 03/24/2007   SLEEP APNEA, MILD 03/24/2007   COLONIC POLYPS, HX OF 03/24/2007    Past Surgical History:  Procedure Laterality Date   APPENDECTOMY  ~ 06/2007   BLADDER REPAIR  ~ 06/2007   "same day after bladder lift" (01/24/2013)   BLADDER SUSPENSION  ~ 06/2007   BREAST BIOPSY Left    DIAGNOSTIC LAPAROSCOPY  1990's & ~ 2000   "I've had a couple; for endometrosis" (01/24/2013)   HERNIA REPAIR     INCISIONAL HERNIA REPAIR N/A 01/24/2013   Procedure: LAPAROSCOPIC INCISIONAL HERNIA;  Surgeon: Shelly Rubenstein, MD;  Location: [facility] OR;  Service: General;  Laterality: N/A;   INSERTION OF MESH N/A 01/24/2013   Procedure: INSERTION OF MESH;  Surgeon: Shelly Rubenstein, MD;  Location: [facility] OR;  Service: General;  Laterality: N/A;   LAPAROSCOPIC INCISIONAL / UMBILICAL / VENTRAL HERNIA REPAIR  01/24/2013   IHR w/mesh/notes   NASAL SEPTUM SURGERY  1980's?   OOPHORECTOMY Right 2009   TONSILLECTOMY  1990's   VAGINAL HYSTERECTOMY  ` 06/2007    OB History   No obstetric history on file.  Home Medications    Prior to Admission medications   Medication Sig Start Date End Date Taking? Authorizing Provider  Accu-Chek Softclix Lancets lancets Use as instructed 06/23/22  Yes Venia Carbon, MD  albuterol (VENTOLIN HFA) 108 (90 Base) MCG/ACT inhaler Inhale 2 puffs into the lungs every 6 (six) hours as needed for wheezing or shortness of breath. 06/02/21  Yes Jaynee Eagles, PA-C  CYMBALTA 30 MG capsule Take 3 capsules (90 mg total) by mouth daily. 06/01/22  Yes Bayard Hugger, NP  esomeprazole (NEXIUM) 40 MG capsule Take 40 mg by mouth daily. 05/06/22  Yes [provider]  fluticasone (FLONASE) 50 MCG/ACT nasal  spray Place 2 sprays into both nostrils daily. 10/25/22  Yes Morley Kos L, NP  glucose blood (ACCU-CHEK GUIDE) test strip Use as instructed 10/29/21  Yes Venia Carbon, MD  HYDROcodone-acetaminophen (NORCO/VICODIN) 5-325 MG tablet Take 2 tablets by mouth 2 (two) times daily as needed for moderate pain. 10/22/22  Yes Bayard Hugger, NP  ibuprofen (ADVIL) 600 MG tablet TAKE 1 TABLET (600 MG TOTAL) BY MOUTH EVERY 8 (EIGHT) HOURS AS NEEDED FOR MILD PAIN. 07/21/22  Yes Kirsteins, Luanna Salk, MD  loratadine (CLARITIN) 10 MG tablet Take 10 mg by mouth daily.   Yes [provider]  metFORMIN (GLUCOPHAGE-XR) 500 MG 24 hr tablet TAKE 2 TABLETS BY MOUTH EVERY DAY WITH BREAKFAST 09/06/22  Yes Viviana Simpler I, MD  montelukast (SINGULAIR) 10 MG tablet TAKE 1 TABLET BY MOUTH EVERYDAY AT BEDTIME 07/14/22  Yes Viviana Simpler I, MD  olmesartan (BENICAR) 40 MG tablet TAKE 1 TABLET BY MOUTH EVERY DAY 09/06/22  Yes Venia Carbon, MD  omeprazole (PRILOSEC) 40 MG capsule Take 40 mg by mouth 2 (two) times daily. 07/27/21  Yes [provider]  predniSONE (STERAPRED UNI-PAK 21 TAB) 10 MG (21) TBPK tablet Take by mouth daily. Take 6 tabs by mouth daily  for 2 days, then 5 tabs for 2 days, then 4 tabs for 2 days, then 3 tabs for 2 days, 2 tabs for 2 days, then 1 tab by mouth daily for 2 days 10/25/22  Yes Morley Kos L, NP  rosuvastatin (CRESTOR) 5 MG tablet Take 1 tablet (5 mg total) by mouth daily. 04/01/22  Yes Venia Carbon, MD  SYMBICORT 160-4.5 MCG/ACT inhaler INHALE 2 PUFFS BY MOUTH INTO THE LUNGS DAILY 07/07/22  Yes Venia Carbon, MD  tiZANidine (ZANAFLEX) 4 MG tablet TAKE 1 TABLET (4 MG TOTAL) BY MOUTH 4 (FOUR) TIMES DAILY. 09/29/22  Yes Bayard Hugger, NP  topiramate (TOPAMAX) 100 MG tablet TAKE 1 TABLET BY MOUTH EVERY MORNING AND TAKE 2 TABLETS BY MOUTH EVERY EVENING 10/15/22  Yes Kirsteins, Luanna Salk, MD  traMADol (ULTRAM) 50 MG tablet TAKE 1 TABLET BY MOUTH TWICE A DAY.  DIRECTION CHANGE. 10/22/22  Yes Bayard Hugger, NP  traMADol (ULTRAM-ER) 100 MG 24 hr tablet Take 1 tablet (100 mg total) by mouth daily. 10/22/22  Yes Bayard Hugger, NP  metoCLOPramide (REGLAN) 10 MG tablet Take 10 mg by mouth 2 (two) times daily.    [provider]  traZODone (DESYREL) 50 MG tablet TAKE 1.5 TABLETS BY MOUTH AT BEDTIME 04/29/22   Bayard Hugger, NP  losartan (COZAAR) 100 MG tablet TAKE 1 TABLET BY MOUTH EVERY DAY 04/26/19 04/28/19  Venia Carbon, MD    Family History Family History  Problem Relation Age of Onset   Cancer Mother  lung   Hypertension Mother    Cancer Father        colon   Hypertension Sister    Cancer Sister    Cancer Brother    Hyperlipidemia Sister    Birth defects Maternal Grandmother        breast   Breast cancer Maternal Grandmother    Birth defects Paternal Grandmother        uterine, stomach, lung   Breast cancer Paternal Grandmother    Breast cancer Paternal Aunt     Social History Social History   Tobacco Use   Smoking status: Never    Passive exposure: Current   Smokeless tobacco: Never  Vaping Use   Vaping Use: Never used  Substance Use Topics   Alcohol use: Not Currently    Alcohol/week: 0.0 standard drinks of alcohol    Comment: 01/24/2013 "drink or 2 once or /twice/year, if that"   Drug use: No     Allergies   Meperidine, Cymbalta [duloxetine hcl], Diazepam, Other, and Shellfish allergy   Review of Systems Review of Systems  Constitutional:  Positive for chills and fatigue. Negative for fever.  HENT:  Positive for congestion, ear pain, postnasal drip, rhinorrhea, sinus pain and sore throat.   Eyes: Negative.   Respiratory:  Positive for cough and shortness of breath. Negative for wheezing.   Cardiovascular: Negative.   Gastrointestinal: Negative.   Genitourinary: Negative.   Neurological: Negative.      Physical Exam Triage Vital Signs ED Triage Vitals  Enc Vitals Group     BP 10/25/22  1503 122/77     Pulse Rate 10/25/22 1503 97     Resp 10/25/22 1503 18     Temp 10/25/22 1503 98.6 F (37 C)     Temp Source 10/25/22 1503 Oral     SpO2 10/25/22 1503 96 %     Weight 10/25/22 1505 174 lb (78.9 kg)     Height 10/25/22 1505 5\' 7"  (1.702 m)     Head Circumference --      Peak Flow --      Pain Score 10/25/22 1504 7     Pain Loc --      Pain Edu? --      Excl. in GC? --    No data found.  Updated Vital Signs BP 122/77 (BP Location: Right Arm)   Pulse 97   Temp 98.6 F (37 C) (Oral)   Resp 18   Ht 5\' 7"  (1.702 m)   Wt 174 lb (78.9 kg)   SpO2 96%   BMI 27.25 kg/m   Visual Acuity Right Eye Distance:   Left Eye Distance:   Bilateral Distance:    Right Eye Near:   Left Eye Near:    Bilateral Near:     Physical Exam Constitutional:      Appearance: She is well-developed.  HENT:     Right Ear: Tympanic membrane normal.     Left Ear: Tympanic membrane normal.     Nose: Congestion present.     Mouth/Throat:     Mouth: Mucous membranes are moist. No oral lesions.     Pharynx: Posterior oropharyngeal erythema present. No pharyngeal swelling or oropharyngeal exudate.     Tonsils: No tonsillar exudate or tonsillar abscesses. 1+ on the right. 1+ on the left.  Eyes:     Conjunctiva/sclera: Conjunctivae normal.  Cardiovascular:     Rate and Rhythm: Normal rate.     Heart sounds: Normal heart sounds.  Pulmonary:     Effort: Pulmonary effort is normal.     Breath sounds: Normal breath sounds.  Abdominal:     Palpations: Abdomen is soft.  Musculoskeletal:     Cervical back: Normal range of motion.  Skin:    General: Skin is warm.  Neurological:     General: No focal deficit present.     Mental Status: She is alert.      UC Treatments / Results  Labs (all labs ordered are listed, but only abnormal results are displayed) Labs Reviewed - No data to display  EKG   Radiology DG Chest 2 View  Result Date: 10/25/2022 CLINICAL DATA:  Cough.  Shortness  of breath. EXAM: CHEST - 2 VIEW COMPARISON:  February 09, 2021. FINDINGS: The heart size and mediastinal contours are within normal limits. Both lungs are clear. The visualized skeletal structures are unremarkable. IMPRESSION: No active cardiopulmonary disease. Electronically Signed   By: Lupita Raider M.D.   On: 10/25/2022 15:52    Procedures Procedures (including critical care time)  Medications Ordered in UC Medications  ketorolac (TORADOL) 30 MG/ML injection 30 mg (30 mg Intramuscular Given 10/25/22 1559)    Initial Impression / Assessment and Plan / UC Course  I have reviewed the triage vital signs and the nursing notes.  Pertinent labs & imaging results that were available during my care of the patient were reviewed by me and considered in my medical decision making (see chart for details).    Continue to use throat lozenges and Chloraseptic spray as needed Take Tylenol Motrin as needed for pain or fever Cont to take your daily allergy medications   X-ray was negative This can be residual still from the viral illness Stay hydrated if symptoms become worse you may need to be evaluated in the emergency room for further evaluation Use nasal spray as needed for nasal congestion Patient can take a Benadryl at nighttime if needed but will need to not take the Claritin if she does take  No antibiotic is needed at this time Final Clinical Impressions(s) / UC Diagnoses   Final diagnoses:  Acute pharyngitis, unspecified etiology  Shortness of breath     Discharge Instructions      Continue to use throat lozenges and Chloraseptic spray as needed Take Tylenol Motrin as needed for pain or fever Cont to take your daily allergy medications       ED Prescriptions     Medication Sig Dispense Auth. Provider   predniSONE (STERAPRED UNI-PAK 21 TAB) 10 MG (21) TBPK tablet Take by mouth daily. Take 6 tabs by mouth daily  for 2 days, then 5 tabs for 2 days, then 4 tabs for 2 days, then 3  tabs for 2 days, 2 tabs for 2 days, then 1 tab by mouth daily for 2 days 42 tablet Maple Mirza L, NP   fluticasone (FLONASE) 50 MCG/ACT nasal spray Place 2 sprays into both nostrils daily. 16 g Coralyn Mark, NP      PDMP not reviewed this encounter.   Coralyn Mark, NP 10/25/22 [phone]

## 2022-10-26 ENCOUNTER — Encounter: Payer: Self-pay | Admitting: Registered Nurse

## 2022-10-26 ENCOUNTER — Ambulatory Visit: Payer: Medicare HMO | Admitting: Family Medicine

## 2022-10-27 LAB — DRUG TOX ALC METAB W/CON, ORAL FLD: Alcohol Metabolite: NEGATIVE ng/mL (ref <25)

## 2022-10-27 LAB — DRUG TOX MONITOR 1 W/CONF, ORAL FLD
Amphetamines: NEGATIVE ng/mL (ref <10)
Barbiturates: NEGATIVE ng/mL (ref <10)
Benzodiazepines: NEGATIVE ng/mL (ref <0.50)
Buprenorphine: NEGATIVE ng/mL (ref <0.10)
Cocaine: NEGATIVE ng/mL (ref <5.0)
Codeine: NEGATIVE ng/mL (ref <2.5)
Dihydrocodeine: NEGATIVE ng/mL (ref <2.5)
Fentanyl: NEGATIVE ng/mL (ref <0.10)
Heroin Metabolite: NEGATIVE ng/mL (ref <1.0)
Hydrocodone: 22.4 ng/mL — ABNORMAL HIGH (ref <2.5)
Hydromorphone: NEGATIVE ng/mL (ref <2.5)
MARIJUANA: NEGATIVE ng/mL (ref <2.5)
MDMA: NEGATIVE ng/mL (ref <10)
Meprobamate: NEGATIVE ng/mL (ref <2.5)
Methadone: NEGATIVE ng/mL (ref <5.0)
Morphine: NEGATIVE ng/mL (ref <2.5)
Nicotine Metabolite: NEGATIVE ng/mL (ref <5.0)
Norhydrocodone: NEGATIVE ng/mL (ref <2.5)
Noroxycodone: NEGATIVE ng/mL (ref <2.5)
Opiates: POSITIVE ng/mL — AB (ref <2.5)
Oxycodone: NEGATIVE ng/mL (ref <2.5)
Oxymorphone: NEGATIVE ng/mL (ref <2.5)
Phencyclidine: NEGATIVE ng/mL (ref <10)
Tapentadol: NEGATIVE ng/mL (ref <5.0)
Tramadol: >500 ng/mL — ABNORMAL HIGH (ref <5.0)
Tramadol: POSITIVE ng/mL — AB (ref <5.0)
Zolpidem: NEGATIVE ng/mL (ref <5.0)

## 2022-11-04 ENCOUNTER — Ambulatory Visit (INDEPENDENT_AMBULATORY_CARE_PROVIDER_SITE_OTHER): Payer: Medicare HMO | Admitting: Internal Medicine

## 2022-11-04 ENCOUNTER — Encounter: Payer: Self-pay | Admitting: Internal Medicine

## 2022-11-04 VITALS — BP 110/78 | HR 90 | Temp 97.8°F | Ht 68.0 in | Wt 170.0 lb

## 2022-11-04 DIAGNOSIS — J0140 Acute pansinusitis, unspecified: Secondary | ICD-10-CM | POA: Diagnosis not present

## 2022-11-04 MED ORDER — AMOXICILLIN-POT CLAVULANATE 875-125 MG PO TABS: 1.0000 | ORAL_TABLET | Freq: Two times a day (BID) | ORAL | 0 refills | Status: DC

## 2022-11-04 NOTE — Assessment & Plan Note (Signed)
Going on for weeks Will try augmentin 875 bid for a week analgesics

## 2022-11-04 NOTE — Progress Notes (Signed)
Subjective:    Patient ID: [name], female    DOB: 04/22/1966, 57 y.o.   MRN: [id number]  HPI Here due to ongoing respiratory symptoms With husband  Had flu around Christmas Wasn't improving--so went to urgent care 1/8 CXR okay Got steroids for "lung inflammation" Breathing is better but throat is still sore Ears are painful Frontal headache No fever Some cough--dry Coricidin not really helping  Left foot is swelling some Painful a week ago--but improved Then noticed swelling by great toe 2 days ago No trauma Not really red  Current Outpatient Medications on File Prior to Visit  Medication Sig Dispense Refill   Accu-Chek Softclix Lancets lancets Use as instructed 100 each 5   albuterol (VENTOLIN HFA) 108 (90 Base) MCG/ACT inhaler Inhale 2 puffs into the lungs every 6 (six) hours as needed for wheezing or shortness of breath. 18 g 1   CYMBALTA 30 MG capsule Take 3 capsules (90 mg total) by mouth daily. 270 capsule 2   esomeprazole (NEXIUM) 40 MG capsule Take 40 mg by mouth daily.     fluticasone (FLONASE) 50 MCG/ACT nasal spray Place 2 sprays into both nostrils daily. 16 g 2   glucose blood (ACCU-CHEK GUIDE) test strip Use as instructed 100 each 5   HYDROcodone-acetaminophen (NORCO/VICODIN) 5-325 MG tablet Take 2 tablets by mouth 2 (two) times daily as needed for moderate pain. 120 tablet 0   ibuprofen (ADVIL) 600 MG tablet TAKE 1 TABLET (600 MG TOTAL) BY MOUTH EVERY 8 (EIGHT) HOURS AS NEEDED FOR MILD PAIN. 90 tablet 5   loratadine (CLARITIN) 10 MG tablet Take 10 mg by mouth daily.     metFORMIN (GLUCOPHAGE-XR) 500 MG 24 hr tablet TAKE 2 TABLETS BY MOUTH EVERY DAY WITH BREAKFAST 180 tablet 3   montelukast (SINGULAIR) 10 MG tablet TAKE 1 TABLET BY MOUTH EVERYDAY AT BEDTIME 90 tablet 3   olmesartan (BENICAR) 40 MG tablet TAKE 1 TABLET BY MOUTH EVERY DAY 90 tablet 3   omeprazole (PRILOSEC) 40 MG capsule Take 40 mg by mouth 2 (two) times daily.     rosuvastatin  (CRESTOR) 5 MG tablet Take 1 tablet (5 mg total) by mouth daily. 90 tablet 3   SYMBICORT 160-4.5 MCG/ACT inhaler INHALE 2 PUFFS BY MOUTH INTO THE LUNGS DAILY 10.2 each 11   tiZANidine (ZANAFLEX) 4 MG tablet TAKE 1 TABLET (4 MG TOTAL) BY MOUTH 4 (FOUR) TIMES DAILY. 120 tablet 3   topiramate (TOPAMAX) 100 MG tablet TAKE 1 TABLET BY MOUTH EVERY MORNING AND TAKE 2 TABLETS BY MOUTH EVERY EVENING 270 tablet 1   traMADol (ULTRAM) 50 MG tablet TAKE 1 TABLET BY MOUTH TWICE A DAY. DIRECTION CHANGE. 60 tablet 2   traMADol (ULTRAM-ER) 100 MG 24 hr tablet Take 1 tablet (100 mg total) by mouth daily. 30 tablet 2   traZODone (DESYREL) 50 MG tablet TAKE 1.5 TABLETS BY MOUTH AT BEDTIME 135 tablet 2   [DISCONTINUED] losartan (COZAAR) 100 MG tablet TAKE 1 TABLET BY MOUTH EVERY DAY 90 tablet 0   No current facility-administered medications on file prior to visit.    Allergies  Allergen Reactions   Meperidine Anaphylaxis, Itching and Swelling    REACTION: swelling   Cymbalta [Duloxetine Hcl] Hives    Generic makes her feel like she is crawling out of her skin   Diazepam Hives    REACTION: swelling   Other     Axe Cologne   Shellfish Allergy Hives    Past Medical History:  Diagnosis Date   Allergy    Anxiety    Arthritis    "just the norm" (01/24/2013)   ASCVD (arteriosclerotic cardiovascular disease)    Asthma    Celiac disease    Daily headache    "depends on the season" (01/24/2013)   Diabetes mellitus without complication (HCC)    Fibromyalgia    GERD (gastroesophageal reflux disease)    History of colonic polyps    Hyperlipidemia    Hypertension    Migraines    Obesity (BMI 30.0-34.9)    RSD (reflex sympathetic dystrophy)    Sleep apnea    NO MACHINE RECOMMENDED   TIA (transient ischemic attack) ~ 2009    Past Surgical History:  Procedure Laterality Date   APPENDECTOMY  ~ 06/2007   BLADDER REPAIR  ~ 06/2007   "same day after bladder lift" (01/24/2013)   BLADDER SUSPENSION  ~ 06/2007    BREAST BIOPSY Left    DIAGNOSTIC LAPAROSCOPY  1990's & ~ 2000   "I've had a couple; for endometrosis" (01/24/2013)   HERNIA REPAIR     INCISIONAL HERNIA REPAIR N/A 01/24/2013   Procedure: LAPAROSCOPIC INCISIONAL HERNIA;  Surgeon: Shelly Rubenstein, MD;  Location: [facility] OR;  Service: General;  Laterality: N/A;   INSERTION OF MESH N/A 01/24/2013   Procedure: INSERTION OF MESH;  Surgeon: Shelly Rubenstein, MD;  Location: [facility] OR;  Service: General;  Laterality: N/A;   LAPAROSCOPIC INCISIONAL / UMBILICAL / VENTRAL HERNIA REPAIR  01/24/2013   IHR w/mesh/notes   NASAL SEPTUM SURGERY  1980's?   OOPHORECTOMY Right 2009   TONSILLECTOMY  1990's   VAGINAL HYSTERECTOMY  ` 06/2007    Family History  Problem Relation Age of Onset   Cancer Mother        lung   Hypertension Mother    Cancer Father        colon   Hypertension Sister    Cancer Sister    Cancer Brother    Hyperlipidemia Sister    Birth defects Maternal Grandmother        breast   Breast cancer Maternal Grandmother    Birth defects Paternal Grandmother        uterine, stomach, lung   Breast cancer Paternal Grandmother    Breast cancer Paternal Aunt     Social History   Socioeconomic History   Marital status: Married    Spouse name: Not on file   Number of children: 2   Years of education: Not on file   Highest education level: Not on file  Occupational History   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  Tobacco Use   Smoking status: Never    Passive exposure: Current   Smokeless tobacco: Never  Vaping Use   Vaping Use: Never used  Substance and Sexual Activity   Alcohol use: Not Currently    Alcohol/week: 0.0 standard drinks of alcohol    Comment: 01/24/2013 "drink or 2 once or /twice/year, if that"   Drug use: No   Sexual activity: Not Currently  Other Topics Concern   Not on file  Social History Narrative   No living will   Would want husband as POA--then sister, [name]   Would accept resuscitation attempts   Probably would  not want tube feeds if cognitively unaware   Social Determinants of Health   Financial Resource Strain: Not on file  Food Insecurity: Not on file  Transportation Needs: Not on file  Physical Activity: Not on file  Stress:  Not on file  Social Connections: Not on file  Intimate Partner Violence: Not on file   Review of Systems Some evening nausea No vomiting Appetite is off some No diarrhea     Objective:   Physical Exam Constitutional:      Appearance: Normal appearance.  HENT:     Head:     Comments: Frontal and left maxillary tenderness    Right Ear: Tympanic membrane and ear canal normal.     Left Ear: Tympanic membrane and ear canal normal.     Nose: Congestion present.     Mouth/Throat:     Comments: Slight pharyngeal injection Pulmonary:     Effort: Pulmonary effort is normal.     Breath sounds: Normal breath sounds. No wheezing or rales.  Musculoskeletal:     Cervical back: Neck supple.     Comments: Tenderness between 1st and 2nd metatarsals No sig swelling and no inflammation Normal pulse there (Discussed support shoes for more support--mechanical pain)  Lymphadenopathy:     Cervical: No cervical adenopathy.  Neurological:     Mental Status: She is alert.            Assessment & Plan:

## 2022-11-15 ENCOUNTER — Encounter: Payer: Self-pay | Admitting: Internal Medicine

## 2022-11-15 ENCOUNTER — Ambulatory Visit (INDEPENDENT_AMBULATORY_CARE_PROVIDER_SITE_OTHER): Payer: Medicare HMO | Admitting: Internal Medicine

## 2022-11-15 VITALS — BP 128/86 | HR 76 | Temp 97.8°F | Ht 68.0 in | Wt 172.4 lb

## 2022-11-15 DIAGNOSIS — K2100 Gastro-esophageal reflux disease with esophagitis, without bleeding: Secondary | ICD-10-CM

## 2022-11-15 DIAGNOSIS — J0140 Acute pansinusitis, unspecified: Secondary | ICD-10-CM | POA: Diagnosis not present

## 2022-11-15 MED ORDER — MECLIZINE HCL 25 MG PO TABS: 25.0000 mg | ORAL_TABLET | Freq: Three times a day (TID) | ORAL | 0 refills | Status: DC | PRN

## 2022-11-15 MED ORDER — CLINDAMYCIN HCL 300 MG PO CAPS: 300.0000 mg | ORAL_CAPSULE | Freq: Three times a day (TID) | ORAL | 0 refills | Status: DC

## 2022-11-15 NOTE — Progress Notes (Signed)
Subjective:    Patient ID: [name], female    DOB: 10-Nov-1965, 57 y.o.   MRN: [id number]  HPI Here due to ongoing headache and sinus symptoms With husband  Got slightly better with the antibiotic Back with burning throat and tongue Still with headache and feels it in the back of her throat Dizzy--"like vertigo" No fever No post nasal drip Some left ear pain  Using ibuprofen--will take the edge off the headache Still with frontal and maxillary pain  Reflux has flared off the reglan---?causing the throat symptoms?  Current Outpatient Medications on File Prior to Visit  Medication Sig Dispense Refill   Accu-Chek Softclix Lancets lancets Use as instructed 100 each 5   albuterol (VENTOLIN HFA) 108 (90 Base) MCG/ACT inhaler Inhale 2 puffs into the lungs every 6 (six) hours as needed for wheezing or shortness of breath. 18 g 1   CYMBALTA 30 MG capsule Take 3 capsules (90 mg total) by mouth daily. 270 capsule 2   esomeprazole (NEXIUM) 40 MG capsule Take 40 mg by mouth daily.     fluticasone (FLONASE) 50 MCG/ACT nasal spray Place 2 sprays into both nostrils daily. 16 g 2   glucose blood (ACCU-CHEK GUIDE) test strip Use as instructed 100 each 5   HYDROcodone-acetaminophen (NORCO/VICODIN) 5-325 MG tablet Take 2 tablets by mouth 2 (two) times daily as needed for moderate pain. 120 tablet 0   ibuprofen (ADVIL) 600 MG tablet TAKE 1 TABLET (600 MG TOTAL) BY MOUTH EVERY 8 (EIGHT) HOURS AS NEEDED FOR MILD PAIN. 90 tablet 5   loratadine (CLARITIN) 10 MG tablet Take 10 mg by mouth daily.     metFORMIN (GLUCOPHAGE-XR) 500 MG 24 hr tablet TAKE 2 TABLETS BY MOUTH EVERY DAY WITH BREAKFAST 180 tablet 3   montelukast (SINGULAIR) 10 MG tablet TAKE 1 TABLET BY MOUTH EVERYDAY AT BEDTIME 90 tablet 3   olmesartan (BENICAR) 40 MG tablet TAKE 1 TABLET BY MOUTH EVERY DAY 90 tablet 3   omeprazole (PRILOSEC) 40 MG capsule Take 40 mg by mouth 2 (two) times daily.     rosuvastatin (CRESTOR) 5 MG  tablet Take 1 tablet (5 mg total) by mouth daily. 90 tablet 3   SYMBICORT 160-4.5 MCG/ACT inhaler INHALE 2 PUFFS BY MOUTH INTO THE LUNGS DAILY 10.2 each 11   tiZANidine (ZANAFLEX) 4 MG tablet TAKE 1 TABLET (4 MG TOTAL) BY MOUTH 4 (FOUR) TIMES DAILY. 120 tablet 3   topiramate (TOPAMAX) 100 MG tablet TAKE 1 TABLET BY MOUTH EVERY MORNING AND TAKE 2 TABLETS BY MOUTH EVERY EVENING 270 tablet 1   traMADol (ULTRAM) 50 MG tablet TAKE 1 TABLET BY MOUTH TWICE A DAY. DIRECTION CHANGE. 60 tablet 2   traMADol (ULTRAM-ER) 100 MG 24 hr tablet Take 1 tablet (100 mg total) by mouth daily. 30 tablet 2   traZODone (DESYREL) 50 MG tablet TAKE 1.5 TABLETS BY MOUTH AT BEDTIME 135 tablet 2   [DISCONTINUED] losartan (COZAAR) 100 MG tablet TAKE 1 TABLET BY MOUTH EVERY DAY 90 tablet 0   No current facility-administered medications on file prior to visit.    Allergies  Allergen Reactions   Meperidine Anaphylaxis, Itching and Swelling    REACTION: swelling   Cymbalta [Duloxetine Hcl] Hives    Generic makes her feel like she is crawling out of her skin   Diazepam Hives    REACTION: swelling   Other     Axe Cologne   Shellfish Allergy Hives    Past Medical History:  Diagnosis  Date   Allergy    Anxiety    Arthritis    "just the norm" (01/24/2013)   ASCVD (arteriosclerotic cardiovascular disease)    Asthma    Celiac disease    Daily headache    "depends on the season" (01/24/2013)   Diabetes mellitus without complication (HCC)    Fibromyalgia    GERD (gastroesophageal reflux disease)    History of colonic polyps    Hyperlipidemia    Hypertension    Migraines    Obesity (BMI 30.0-34.9)    RSD (reflex sympathetic dystrophy)    Sleep apnea    NO MACHINE RECOMMENDED   TIA (transient ischemic attack) ~ 2009    Past Surgical History:  Procedure Laterality Date   APPENDECTOMY  ~ 06/2007   BLADDER REPAIR  ~ 06/2007   "same day after bladder lift" (01/24/2013)   BLADDER SUSPENSION  ~ 06/2007   BREAST BIOPSY  Left    DIAGNOSTIC LAPAROSCOPY  1990's & ~ 2000   "I've had a couple; for endometrosis" (01/24/2013)   [place] N/A 01/24/2013   Procedure: LAPAROSCOPIC INCISIONAL HERNIA;  Surgeon: Harl Bowie, MD;  Location: [facility];  Service: General;  Laterality: N/A;   INSERTION OF MESH N/A 01/24/2013   Procedure: INSERTION OF MESH;  Surgeon: Harl Bowie, MD;  Location: [facility];  Service: General;  Laterality: N/A;   LAPAROSCOPIC INCISIONAL / UMBILICAL / [place]  01/24/2013   IHR w/mesh/notes   NASAL SEPTUM SURGERY  1980's?   OOPHORECTOMY Right 2009   TONSILLECTOMY  1990's   VAGINAL HYSTERECTOMY  ` 06/2007    Family History  Problem Relation Age of Onset   Cancer Mother        lung   Hypertension Mother    Cancer Father        colon   Hypertension Sister    Cancer Sister    Cancer Brother    Hyperlipidemia Sister    Birth defects Maternal Grandmother        breast   Breast cancer Maternal Grandmother    Birth defects Paternal Grandmother        uterine, stomach, lung   Breast cancer Paternal Grandmother    Breast cancer Paternal Aunt     Social History   Socioeconomic History   Marital status: Married    Spouse name: Not on file   Number of children: 2   Years of education: Not on file   Highest education level: Not on file  Occupational History   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  Tobacco Use   Smoking status: Never    Passive exposure: Current   Smokeless tobacco: Never  Vaping Use   Vaping Use: Never used  Substance and Sexual Activity   Alcohol use: Not Currently    Alcohol/week: 0.0 standard drinks of alcohol    Comment: 01/24/2013 "drink or 2 once or /twice/year, if that"   Drug use: No   Sexual activity: Not Currently  Other Topics Concern   Not on file  Social History Narrative   No living will   Would want husband as POA--then sister, Neoma Laming   Would accept resuscitation attempts   Probably would not want tube  feeds if cognitively unaware   Social Determinants of Health   Financial Resource Strain: Not on file  Food Insecurity: Not on file  Transportation Needs: Not on file  Physical Activity: Not on file  Stress: Not  on file  Social Connections: Not on file  Intimate Partner Violence: Not on file   Review of Systems Some nausea without vomiting--mostly at night Able to eat    Objective:   Physical Exam Constitutional:      Appearance: Normal appearance.  HENT:     Head:     Comments: Maxillary tenderness    Nose: Congestion present.     Mouth/Throat:     Pharynx: No oropharyngeal exudate or posterior oropharyngeal erythema.  Neck:     Comments: Pain spot is occipital ridge--but not tender Pulmonary:     Effort: Pulmonary effort is normal.     Breath sounds: Normal breath sounds. No wheezing or rales.  Musculoskeletal:     Cervical back: Neck supple.  Lymphadenopathy:     Cervical: No cervical adenopathy.  Neurological:     Mental Status: She is alert.            Assessment & Plan:

## 2022-11-15 NOTE — Assessment & Plan Note (Signed)
Worse symptoms since off the reglan Still nexium 40 bid She will contact her GI

## 2022-11-15 NOTE — Assessment & Plan Note (Signed)
Persistent symptoms Will try clindamycin 300 tid x 10 days Discussed stopping if loose bowels

## 2022-11-19 ENCOUNTER — Encounter: Payer: Worker's Compensation | Attending: Registered Nurse | Admitting: Registered Nurse

## 2022-11-19 ENCOUNTER — Encounter: Payer: Self-pay | Admitting: Registered Nurse

## 2022-11-19 VITALS — BP 134/84 | HR 90 | Ht 68.0 in | Wt 170.0 lb

## 2022-11-19 DIAGNOSIS — F3289 Other specified depressive episodes: Secondary | ICD-10-CM | POA: Insufficient documentation

## 2022-11-19 DIAGNOSIS — G4700 Insomnia, unspecified: Secondary | ICD-10-CM | POA: Insufficient documentation

## 2022-11-19 DIAGNOSIS — M62838 Other muscle spasm: Secondary | ICD-10-CM | POA: Diagnosis present

## 2022-11-19 DIAGNOSIS — G90512 Complex regional pain syndrome I of left upper limb: Secondary | ICD-10-CM | POA: Insufficient documentation

## 2022-11-19 DIAGNOSIS — G894 Chronic pain syndrome: Secondary | ICD-10-CM | POA: Diagnosis not present

## 2022-11-19 DIAGNOSIS — Z5181 Encounter for therapeutic drug level monitoring: Secondary | ICD-10-CM | POA: Insufficient documentation

## 2022-11-19 DIAGNOSIS — Z79891 Long term (current) use of opiate analgesic: Secondary | ICD-10-CM | POA: Diagnosis not present

## 2022-11-19 MED ORDER — HYDROCODONE-ACETAMINOPHEN 5-325 MG PO TABS: 2.0000 | ORAL_TABLET | Freq: Two times a day (BID) | ORAL | 0 refills | Status: DC | PRN

## 2022-11-19 NOTE — Progress Notes (Signed)
Subjective:    Patient ID: [name], female    DOB: [birth year], 57 y.o.   MRN: [id number]  HPI: [name] is a 57 y.o. female who returns for follow up appointment for chronic pain and medication refill. She states her pain is located in her  left upper extremity  and left wrist. She rates her pain 6. Her current exercise regime is walking short distances.  Ms. Grissom Morphine equivalent is 60.00 MME.   Last oral swab was performed on 10/22/2022, it was consistent.    Pain Inventory Average Pain 6 Pain Right Now 6 My pain is constant, sharp, burning, stabbing, tingling, and aching  In the last 24 hours, has pain interfered with the following? General activity 4 Relation with others 4 Enjoyment of life 4 What TIME of day is your pain at its worst? morning , daytime, evening, and night Sleep (in general) Poor  Pain is worse with: walking, bending, sitting, and standing Pain improves with: rest, heat/ice, pacing activities, and medication Relief from Meds: 5  Family History  Problem Relation Age of Onset   Cancer Mother        lung   Hypertension Mother    Cancer Father        colon   Hypertension Sister    Cancer Sister    Cancer Brother    Hyperlipidemia Sister    Birth defects Maternal Grandmother        breast   Breast cancer Maternal Grandmother    Birth defects Paternal Grandmother        uterine, stomach, lung   Breast cancer Paternal Grandmother    Breast cancer Paternal Aunt    Social History   Socioeconomic History   Marital status: Married    Spouse name: Not on file   Number of children: 2   Years of education: Not on file   Highest education level: Not on file  Occupational History   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  Tobacco Use   Smoking status: Never    Passive exposure: Current   Smokeless tobacco: Never  Vaping Use   Vaping Use: Never used  Substance and Sexual Activity   Alcohol use: Not Currently     Alcohol/week: 0.0 standard drinks of alcohol    Comment: 01/24/2013 "drink or 2 once or /twice/year, if that"   Drug use: No   Sexual activity: Not Currently  Other Topics Concern   Not on file  Social History Narrative   No living will   Would want husband as POA--then sister, Neoma Laming   Would accept resuscitation attempts   Probably would not want tube feeds if cognitively unaware   Social Determinants of Health   Financial Resource Strain: Not on file  Food Insecurity: Not on file  Transportation Needs: Not on file  Physical Activity: Not on file  Stress: Not on file  Social Connections: Not on file   Past Surgical History:  Procedure Laterality Date   APPENDECTOMY  ~ 06/2007   [place]  ~ 06/2007   "same day after bladder lift" (01/24/2013)   BLADDER SUSPENSION  ~ 06/2007   BREAST BIOPSY Left    DIAGNOSTIC LAPAROSCOPY  1990's & ~ 2000   "I've had a couple; for endometrosis" (01/24/2013)   [place] N/A 01/24/2013   Procedure: LAPAROSCOPIC INCISIONAL HERNIA;  Surgeon: Harl Bowie, MD;  Location: [facility];  Service: General;  Laterality: N/A;   INSERTION  OF MESH N/A 01/24/2013   Procedure: INSERTION OF MESH;  Surgeon: Harl Bowie, MD;  Location: [facility];  Service: General;  Laterality: N/A;   LAPAROSCOPIC INCISIONAL / UMBILICAL / [place]  01/24/2013   IHR w/mesh/notes   NASAL SEPTUM SURGERY  1980's?   OOPHORECTOMY Right 2009   TONSILLECTOMY  1990's   VAGINAL HYSTERECTOMY  ` 06/2007   Past Surgical History:  Procedure Laterality Date   APPENDECTOMY  ~ 06/2007   BLADDER REPAIR  ~ 06/2007   "same day after bladder lift" (01/24/2013)   BLADDER SUSPENSION  ~ 06/2007   BREAST BIOPSY Left    DIAGNOSTIC LAPAROSCOPY  1990's & ~ 2000   "I've had a couple; for endometrosis" (01/24/2013)   [place] N/A 01/24/2013   Procedure: LAPAROSCOPIC INCISIONAL HERNIA;  Surgeon: Harl Bowie, MD;  Location: [facility];  Service: General;  Laterality: N/A;   INSERTION OF MESH N/A 01/24/2013   Procedure: INSERTION OF MESH;  Surgeon: Harl Bowie, MD;  Location: [facility];  Service: General;  Laterality: N/A;   LAPAROSCOPIC INCISIONAL / UMBILICAL / [place]  01/24/2013   IHR w/mesh/notes   NASAL SEPTUM SURGERY  1980's?   OOPHORECTOMY Right 2009   TONSILLECTOMY  1990's   VAGINAL HYSTERECTOMY  ` 06/2007   Past Medical History:  Diagnosis Date   Allergy    Anxiety    Arthritis    "just the norm" (01/24/2013)   ASCVD (arteriosclerotic cardiovascular disease)    Asthma    Celiac disease    Daily headache    "depends on the season" (01/24/2013)   Diabetes mellitus without complication (HCC)    Fibromyalgia    GERD (gastroesophageal reflux disease)    History of colonic polyps    Hyperlipidemia    Hypertension    Migraines    Obesity (BMI 30.0-34.9)    RSD (reflex sympathetic dystrophy)    Sleep apnea    NO MACHINE RECOMMENDED   TIA (transient ischemic attack) ~ 2009   There were no vitals taken for this visit.  Opioid Risk Score:   Fall Risk Score:  `1  Depression screen PHQ 2/9     08/24/2022   12:52 PM 06/01/2022    1:10 PM 04/29/2022    1:49 PM 03/17/2022    1:27 PM 02/11/2022    1:36 PM 12/09/2021    1:22 PM 11/10/2021    1:28 PM  Depression screen PHQ 2/9  Decreased Interest 1 1 1 1  0 1 1  Down, Depressed, Hopeless 1 1 1 1  0 1 1  PHQ - 2 Score 2 2 2 2  0 2 2    Review of Systems  Musculoskeletal:        Left arm pain   All other systems reviewed and are negative.     Objective:   Physical Exam Vitals and nursing note reviewed.  Constitutional:      Appearance: Normal appearance.  Cardiovascular:     Rate and Rhythm: Normal rate and regular rhythm.     Pulses: Normal pulses.     Heart sounds: Normal heart sounds.  Pulmonary:     Effort: Pulmonary effort is normal.     Breath sounds: Normal breath sounds.  Musculoskeletal:     Cervical back: Normal range of  motion and neck supple.     Comments: Normal Muscle Bulk and Muscle Testing Reveals:  Upper Extremities: Right: Full ROM and  Muscle Strength 5/5 Left Upper Extremity: Decreased ROM 90 Degrees and Muscle Strength 3/5 Bilateral AC Joint Tenderness Thoracic Paraspinal Tenderness: T-3-T-7  Lower Extremities : Full ROM and Muscle Strength 5/5 Arises from Table slowly Antalgic Gait     Skin:    General: Skin is warm and dry.  Neurological:     Mental Status: She is alert and oriented to person, place, and time.  Psychiatric:        Mood and Affect: Mood normal.        Behavior: Behavior normal.         Assessment & Plan:  1.Complex Regional Pain Syndrome Type 1: Continue Topamax and Cymbalta. 11/19/2022. Refilled: Hydro-codone 7.5/325mg  one tablet twice a day one in the morning and one at bedtime #60. Continue Tramadol 100 mg ER daily #30 and Tramadol 50 mg one tablet by mouth BID. One tablet in the afternoon and one tablet in the evening. We will continue the opioid monitoring program, this consists of regular clinic visits, examinations, urine drug screen, pill counts as well as use of [place] Controlled Substance Reporting system. A 12 month History has been reviewed on the [place] Controlled Substance Reporting System  On 11/19/2022. 2. Depression: Continue current medication regimen with Cymbalta.11/19/2022 3. Insomnia: Continue current medication regimen with  Trazodone one and half tablet at bedtime  11/19/2022 4. Muscle Spasms: Continue current medication regimen with Tizanidine. 11/19/2022 5. Chronic Pain Syndrome: Continue Ibuprofen BID as needed. 11/19/2022   F/U in 1 month

## 2022-12-22 ENCOUNTER — Ambulatory Visit: Payer: Medicare HMO | Admitting: Internal Medicine

## 2022-12-27 ENCOUNTER — Encounter: Payer: Self-pay | Admitting: Internal Medicine

## 2022-12-27 ENCOUNTER — Ambulatory Visit (INDEPENDENT_AMBULATORY_CARE_PROVIDER_SITE_OTHER): Payer: Medicare HMO | Admitting: Internal Medicine

## 2022-12-27 VITALS — BP 136/86 | HR 86 | Temp 98.0°F | Ht 68.0 in | Wt 172.0 lb

## 2022-12-27 DIAGNOSIS — R42 Dizziness and giddiness: Secondary | ICD-10-CM | POA: Diagnosis not present

## 2022-12-27 DIAGNOSIS — E1159 Type 2 diabetes mellitus with other circulatory complications: Secondary | ICD-10-CM | POA: Diagnosis not present

## 2022-12-27 LAB — POCT GLYCOSYLATED HEMOGLOBIN (HGB A1C): Hemoglobin A1C: 7 % — AB (ref 4.0–5.6)

## 2022-12-27 MED ORDER — MECLIZINE HCL 25 MG PO TABS: 25.0000 mg | ORAL_TABLET | Freq: Three times a day (TID) | ORAL | 3 refills | Status: DC

## 2022-12-27 NOTE — Progress Notes (Signed)
Subjective:    Patient ID: [name], female    DOB: [birth year], 57 y.o.   MRN: [id number]  HPI Here with husband due to issues with vertigo  Going on since Christmas May have a good day or 2--then have a bad day Starts in bed--like when turning over in bed (or just opening eyes) Careful about not turning her head--hasn't been driving for the most part  Intermittent tinnitus No change in hearing  Takes the meclizine prn--but having some symptoms most days  Hasn't been checking sugars--needs new meter Wonders about CGM---not sure she would qualify Feels she is following appropriate eating about half the time  Current Outpatient Medications on File Prior to Visit  Medication Sig Dispense Refill   Accu-Chek Softclix Lancets lancets Use as instructed 100 each 5   albuterol (VENTOLIN HFA) 108 (90 Base) MCG/ACT inhaler Inhale 2 puffs into the lungs every 6 (six) hours as needed for wheezing or shortness of breath. 18 g 1   CYMBALTA 30 MG capsule Take 3 capsules (90 mg total) by mouth daily. 270 capsule 2   esomeprazole (NEXIUM) 40 MG capsule Take 40 mg by mouth daily.     fluticasone (FLONASE) 50 MCG/ACT nasal spray Place 2 sprays into both nostrils daily. 16 g 2   glucose blood (ACCU-CHEK GUIDE) test strip Use as instructed 100 each 5   HYDROcodone-acetaminophen (NORCO/VICODIN) 5-325 MG tablet Take 2 tablets by mouth 2 (two) times daily as needed for moderate pain. 120 tablet 0   ibuprofen (ADVIL) 600 MG tablet TAKE 1 TABLET (600 MG TOTAL) BY MOUTH EVERY 8 (EIGHT) HOURS AS NEEDED FOR MILD PAIN. 90 tablet 5   loratadine (CLARITIN) 10 MG tablet Take 10 mg by mouth daily.     meclizine (ANTIVERT) 25 MG tablet Take 1 tablet (25 mg total) by mouth 3 (three) times daily as needed. For vertigo 60 tablet 0   metFORMIN (GLUCOPHAGE-XR) 500 MG 24 hr tablet TAKE 2 TABLETS BY MOUTH EVERY DAY WITH BREAKFAST 180 tablet 3   metoCLOPramide (REGLAN) 10 MG tablet Take 10 mg by mouth daily.      montelukast (SINGULAIR) 10 MG tablet TAKE 1 TABLET BY MOUTH EVERYDAY AT BEDTIME 90 tablet 3   olmesartan (BENICAR) 40 MG tablet TAKE 1 TABLET BY MOUTH EVERY DAY 90 tablet 3   omeprazole (PRILOSEC) 40 MG capsule Take 40 mg by mouth 2 (two) times daily.     rosuvastatin (CRESTOR) 5 MG tablet Take 1 tablet (5 mg total) by mouth daily. 90 tablet 3   SYMBICORT 160-4.5 MCG/ACT inhaler INHALE 2 PUFFS BY MOUTH INTO THE LUNGS DAILY 10.2 each 11   tiZANidine (ZANAFLEX) 4 MG tablet TAKE 1 TABLET (4 MG TOTAL) BY MOUTH 4 (FOUR) TIMES DAILY. 120 tablet 3   topiramate (TOPAMAX) 100 MG tablet TAKE 1 TABLET BY MOUTH EVERY MORNING AND TAKE 2 TABLETS BY MOUTH EVERY EVENING 270 tablet 1   traMADol (ULTRAM) 50 MG tablet TAKE 1 TABLET BY MOUTH TWICE A DAY. DIRECTION CHANGE. 60 tablet 2   traMADol (ULTRAM-ER) 100 MG 24 hr tablet Take 1 tablet (100 mg total) by mouth daily. 30 tablet 2   traZODone (DESYREL) 50 MG tablet TAKE 1.5 TABLETS BY MOUTH AT BEDTIME 135 tablet 2   [DISCONTINUED] losartan (COZAAR) 100 MG tablet TAKE 1 TABLET BY MOUTH EVERY DAY 90 tablet 0   No current facility-administered medications on file prior to visit.    Allergies  Allergen Reactions   Meperidine Anaphylaxis, Itching and  Swelling    REACTION: swelling   Cymbalta [Duloxetine Hcl] Hives    Generic makes her feel like she is crawling out of her skin   Diazepam Hives    REACTION: swelling   Other     Axe Cologne   Shellfish Allergy Hives    Past Medical History:  Diagnosis Date   Allergy    Anxiety    Arthritis    "just the norm" (01/24/2013)   ASCVD (arteriosclerotic cardiovascular disease)    Asthma    Celiac disease    Daily headache    "depends on the season" (01/24/2013)   Diabetes mellitus without complication (HCC)    Fibromyalgia    GERD (gastroesophageal reflux disease)    History of colonic polyps    Hyperlipidemia    Hypertension    Migraines    Obesity (BMI 30.0-34.9)    RSD (reflex sympathetic dystrophy)     Sleep apnea    NO MACHINE RECOMMENDED   TIA (transient ischemic attack) ~ 2009    Past Surgical History:  Procedure Laterality Date   APPENDECTOMY  ~ 06/2007   BLADDER REPAIR  ~ 06/2007   "same day after bladder lift" (01/24/2013)   BLADDER SUSPENSION  ~ 06/2007   BREAST BIOPSY Left    DIAGNOSTIC LAPAROSCOPY  1990's & ~ 2000   "I've had a couple; for endometrosis" (01/24/2013)   [place] N/A 01/24/2013   Procedure: LAPAROSCOPIC INCISIONAL HERNIA;  Surgeon: Harl Bowie, MD;  Location: [facility];  Service: General;  Laterality: N/A;   INSERTION OF MESH N/A 01/24/2013   Procedure: INSERTION OF MESH;  Surgeon: Harl Bowie, MD;  Location: [facility];  Service: General;  Laterality: N/A;   LAPAROSCOPIC INCISIONAL / UMBILICAL / [place]  01/24/2013   IHR w/mesh/notes   NASAL SEPTUM SURGERY  1980's?   OOPHORECTOMY Right 2009   TONSILLECTOMY  1990's   VAGINAL HYSTERECTOMY  ` 06/2007    Family History  Problem Relation Age of Onset   Cancer Mother        lung   Hypertension Mother    Cancer Father        colon   Hypertension Sister    Cancer Sister    Cancer Brother    Hyperlipidemia Sister    Birth defects Maternal Grandmother        breast   Breast cancer Maternal Grandmother    Birth defects Paternal Grandmother        uterine, stomach, lung   Breast cancer Paternal Grandmother    Breast cancer Paternal Aunt     Social History   Socioeconomic History   Marital status: Married    Spouse name: Not on file   Number of children: 2   Years of education: Not on file   Highest education level: Not on file  Occupational History   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  Tobacco Use   Smoking status: Never    Passive exposure: Current   Smokeless tobacco: Never  Vaping Use   Vaping Use: Never used  Substance and Sexual Activity   Alcohol use: Not Currently    Alcohol/week: 0.0 standard drinks of alcohol    Comment: 01/24/2013 "drink or  2 once or /twice/year, if that"   Drug use: No   Sexual activity: Not Currently  Other Topics Concern   Not on file  Social History Narrative   No living will   Would  want husband as POA--then sister, Neoma Laming   Would accept resuscitation attempts   Probably would not want tube feeds if cognitively unaware   Social Determinants of Health   Financial Resource Strain: Not on file  Food Insecurity: Not on file  Transportation Needs: Not on file  Physical Activity: Not on file  Stress: Not on file  Social Connections: Not on file  Intimate Partner Violence: Not on file   Review of Systems Some nausea---mostly at night      Objective:   Physical Exam Eyes:     Extraocular Movements: Extraocular movements intact.     Comments: No nystagmus  Neurological:     Mental Status: She is alert and oriented to person, place, and time.     Cranial Nerves: Cranial nerves 2-12 are intact.     Coordination: Romberg sign negative. Coordination normal.     Gait: Gait normal.            Assessment & Plan:

## 2022-12-27 NOTE — Assessment & Plan Note (Signed)
Has persisted for 2 months or so Still intermittent but some many days Asked her to take the meclizine 25 tid regularly No hearing loss or marked tinnitus--but I do want her to go back to ENT for further evaluation (she will let me know if she needs referral)

## 2022-12-27 NOTE — Assessment & Plan Note (Signed)
A1c down to 7% on the metformin 1000 in the morning Will continue this dose Discussed improving compliance Hard to exercise with CRPS--discussed trying something

## 2022-12-30 ENCOUNTER — Encounter: Payer: Self-pay | Admitting: Registered Nurse

## 2022-12-30 ENCOUNTER — Encounter: Payer: Worker's Compensation | Attending: Registered Nurse | Admitting: Registered Nurse

## 2022-12-30 VITALS — BP 126/81 | HR 85 | Ht 68.0 in | Wt 172.0 lb

## 2022-12-30 DIAGNOSIS — G90512 Complex regional pain syndrome I of left upper limb: Secondary | ICD-10-CM | POA: Diagnosis not present

## 2022-12-30 DIAGNOSIS — G4700 Insomnia, unspecified: Secondary | ICD-10-CM | POA: Diagnosis present

## 2022-12-30 DIAGNOSIS — F3289 Other specified depressive episodes: Secondary | ICD-10-CM | POA: Diagnosis present

## 2022-12-30 DIAGNOSIS — Z5181 Encounter for therapeutic drug level monitoring: Secondary | ICD-10-CM | POA: Diagnosis not present

## 2022-12-30 DIAGNOSIS — M62838 Other muscle spasm: Secondary | ICD-10-CM | POA: Insufficient documentation

## 2022-12-30 DIAGNOSIS — G894 Chronic pain syndrome: Secondary | ICD-10-CM | POA: Insufficient documentation

## 2022-12-30 DIAGNOSIS — Z79891 Long term (current) use of opiate analgesic: Secondary | ICD-10-CM | POA: Diagnosis not present

## 2022-12-30 MED ORDER — TIZANIDINE HCL 4 MG PO TABS: 4.0000 mg | ORAL_TABLET | Freq: Four times a day (QID) | ORAL | 3 refills | Status: DC | PRN

## 2022-12-30 MED ORDER — TRAMADOL HCL 50 MG PO TABS: ORAL_TABLET | ORAL | 2 refills | Status: DC

## 2022-12-30 MED ORDER — HYDROCODONE-ACETAMINOPHEN 5-325 MG PO TABS: 2.0000 | ORAL_TABLET | Freq: Two times a day (BID) | ORAL | 0 refills | Status: DC | PRN

## 2022-12-30 MED ORDER — TRAMADOL HCL ER 100 MG PO TB24: 100.0000 mg | ORAL_TABLET | Freq: Every day | ORAL | 2 refills | Status: DC

## 2022-12-30 NOTE — Patient Instructions (Signed)
Ask your PCP about : Vestibular  Rehabilitation Therapy: If he likes I can place the Referral for you

## 2022-12-30 NOTE — Progress Notes (Signed)
Subjective:    Patient ID: [name], female    DOB: May 29, 1966, 57 y.o.   MRN: [id number]  HPI: [name] is a 57 y.o. female who returns for follow up appointment for chronic pain and medication refill. She states her pain is located in  her left arm. She rates her pain 8. Her current exercise regime is walking and performing stretching exercises.  [name] states she is experiencing Vertigo and her PCP is following. We discussed Vestibular Rehabilitation, she will discussed with her PCP, she will send a My-Chart message with Update, she verbalizes understanding.   [name] [name] equivalent is 60.00 MME.   Last Oral Swab was Performed on 10/22/2022, it was consistent.    Pain Inventory Average Pain 8 Pain Right Now 8 My pain is constant, sharp, burning, stabbing, tingling, and aching  In the last 24 hours, has pain interfered with the following? General activity 6 Relation with others 6 Enjoyment of life 6 What TIME of day is your pain at its worst? daytime, evening, and night Sleep (in general) Poor  Pain is worse with: walking, bending, sitting, inactivity, standing, and some activites Pain improves with: rest, pacing activities, and medication Relief from Meds: 6  Family History  Problem Relation Age of Onset   Cancer Mother        lung   Hypertension Mother    Cancer Father        colon   Hypertension Sister    Cancer Sister    Cancer Brother    Hyperlipidemia Sister    Birth defects Maternal Grandmother        breast   Breast cancer Maternal Grandmother    Birth defects Paternal Grandmother        uterine, stomach, lung   Breast cancer Paternal Grandmother    Breast cancer Paternal Aunt    Social History   Socioeconomic History   Marital status: Married    Spouse name: Not on file   Number of children: 2   Years of education: Not on file   Highest education level: Not on file  Occupational History    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  Tobacco Use   Smoking status: Never    Passive exposure: Current   Smokeless tobacco: Never  Vaping Use   Vaping Use: Never used  Substance and Sexual Activity   Alcohol use: Not Currently    Alcohol/week: 0.0 standard drinks of alcohol    Comment: 01/24/2013 "drink or 2 once or /twice/year, if that"   Drug use: No   Sexual activity: Not Currently  Other Topics Concern   Not on file  Social History Narrative   No living will   Would want husband as POA--then sister, Neoma Laming   Would accept resuscitation attempts   Probably would not want tube feeds if cognitively unaware   Social Determinants of Health   Financial Resource Strain: Not on file  Food Insecurity: Not on file  Transportation Needs: Not on file  Physical Activity: Not on file  Stress: Not on file  Social Connections: Not on file   Past Surgical History:  Procedure Laterality Date   APPENDECTOMY  ~ 06/2007   BLADDER REPAIR  ~ 06/2007   "same day after bladder lift" (01/24/2013)   BLADDER SUSPENSION  ~ 06/2007   BREAST BIOPSY Left    DIAGNOSTIC LAPAROSCOPY  1990's & ~ 2000   "I've had a couple; for endometrosis" (01/24/2013)   HERNIA REPAIR  INCISIONAL HERNIA REPAIR N/A 01/24/2013   Procedure: LAPAROSCOPIC INCISIONAL HERNIA;  Surgeon: Harl Bowie, MD;  Location: [place];  Service: General;  Laterality: N/A;   INSERTION OF MESH N/A 01/24/2013   Procedure: INSERTION OF MESH;  Surgeon: Harl Bowie, MD;  Location: [place];  Service: General;  Laterality: N/A;   LAPAROSCOPIC INCISIONAL / UMBILICAL / [place]  01/24/2013   IHR w/mesh/notes   NASAL SEPTUM SURGERY  1980's?   OOPHORECTOMY Right 2009   TONSILLECTOMY  1990's   VAGINAL HYSTERECTOMY  ` 06/2007   Past Surgical History:  Procedure Laterality Date   APPENDECTOMY  ~ 06/2007   BLADDER REPAIR  ~ 06/2007   "same day after bladder lift" (01/24/2013)   BLADDER SUSPENSION  ~ 06/2007   BREAST BIOPSY Left    DIAGNOSTIC  LAPAROSCOPY  1990's & ~ 2000   "I've had a couple; for endometrosis" (01/24/2013)   [place] N/A 01/24/2013   Procedure: LAPAROSCOPIC INCISIONAL HERNIA;  Surgeon: Harl Bowie, MD;  Location: [place];  Service: General;  Laterality: N/A;   INSERTION OF MESH N/A 01/24/2013   Procedure: INSERTION OF MESH;  Surgeon: Harl Bowie, MD;  Location: [place];  Service: General;  Laterality: N/A;   LAPAROSCOPIC INCISIONAL / UMBILICAL / [place]  01/24/2013   IHR w/mesh/notes   NASAL SEPTUM SURGERY  1980's?   OOPHORECTOMY Right 2009   TONSILLECTOMY  1990's   VAGINAL HYSTERECTOMY  ` 06/2007   Past Medical History:  Diagnosis Date   Allergy    Anxiety    Arthritis    "just the norm" (01/24/2013)   ASCVD (arteriosclerotic cardiovascular disease)    Asthma    Celiac disease    Daily headache    "depends on the season" (01/24/2013)   Diabetes mellitus without complication (HCC)    Fibromyalgia    GERD (gastroesophageal reflux disease)    History of colonic polyps    Hyperlipidemia    Hypertension    Migraines    Obesity (BMI 30.0-34.9)    RSD (reflex sympathetic dystrophy)    Sleep apnea    NO MACHINE RECOMMENDED   TIA (transient ischemic attack) ~ 2009   There were no vitals taken for this visit.  Opioid Risk Score:   Fall Risk Score:  `1  Depression screen PHQ 2/9     08/24/2022   12:52 PM 06/01/2022    1:10 PM 04/29/2022    1:49 PM 03/17/2022    1:27 PM 02/11/2022    1:36 PM 12/09/2021    1:22 PM 11/10/2021    1:28 PM  Depression screen PHQ 2/9  Decreased Interest '1 1 1 1 '$ 0 1 1  Down, Depressed, Hopeless '1 1 1 1 '$ 0 1 1  PHQ - 2 Score '2 2 2 2 '$ 0 2 2    Review of Systems  Musculoskeletal:        Pain in the left arm  All other systems reviewed and are negative.      Objective:   Physical Exam Vitals and nursing note reviewed.  Constitutional:      Appearance: Normal appearance.  Neck:     Comments: Cervical Paraspinal  Tenderness: C-5-C-6 Cardiovascular:     Rate and Rhythm: Normal rate and regular rhythm.     Pulses: Normal pulses.     Heart sounds: Normal heart sounds.  Pulmonary:     Effort: Pulmonary effort is normal.  Breath sounds: Normal breath sounds.  Musculoskeletal:     Cervical back: Normal range of motion and neck supple.     Comments: Normal Muscle Bulk and Muscle Testing Reveals:  Upper Extremities: Right: Full ROM and Muscle Strength 5/5 Left Lower Extremity: Decreased ROM 90 Degrees and Muscle Strength 2/5 Bilateral AC Joint Tenderness  Lower Extremities: Full ROM and Muscle Strength 5/5 Bilateral Lower Extremities Flexion Produces Pin into her bilateral Lower extremities and Bilateral Feet Arises from Table slowly Antalgic  Gait     Skin:    General: Skin is warm and dry.  Neurological:     Mental Status: She is alert and oriented to person, place, and time.  Psychiatric:        Mood and Affect: Mood normal.        Behavior: Behavior normal.         Assessment & Plan:  1.Complex Regional Pain Syndrome Type 1: Continue Topamax and Cymbalta. 12/30/2022. Refilled: Hydro-codone 7.5/'325mg'$  one tablet twice a day one in the morning and one at bedtime #60. Continue Tramadol 100 mg ER daily #30 and Tramadol 50 mg one tablet by mouth BID. One tablet in the afternoon and one tablet in the evening. We will continue the opioid monitoring program, this consists of regular clinic visits, examinations, urine drug screen, pill counts as well as use of [place] Controlled Substance Reporting system. A 12 month History has been reviewed on the [place] Controlled Substance Reporting System  On 12/30/2022. 2. Depression: Continue current medication regimen with Cymbalta.12/30/2022 3. Insomnia: Continue current medication regimen with  Trazodone one and half tablet at bedtime  12/30/2022 4. Muscle Spasms: Continue current medication regimen with Tizanidine. 12/30/2022 5. Chronic  Pain Syndrome: Continue Ibuprofen BID as needed. 12/30/2022   F/U in 1 month

## 2023-01-04 ENCOUNTER — Telehealth: Payer: Self-pay | Admitting: Internal Medicine

## 2023-01-04 DIAGNOSIS — R42 Dizziness and giddiness: Secondary | ICD-10-CM

## 2023-01-04 NOTE — Telephone Encounter (Signed)
Pt called wants PCP to know that she got in touch with ENT and was told that she needs a referral for her vertigo . Please advise [phone]

## 2023-01-05 NOTE — Telephone Encounter (Addendum)
Called patient to find out where she wants [place] to send her referral.   The referral placed did not have a location  If patient returns call please find out location she wants her referral sent to. Thanks!

## 2023-01-05 NOTE — Telephone Encounter (Signed)
Patient returned call,she would like referral sent to [facility]  ENT.

## 2023-01-05 NOTE — Telephone Encounter (Signed)
Noted. See the referral for updates  Thanks

## 2023-01-11 ENCOUNTER — Other Ambulatory Visit: Payer: Self-pay | Admitting: Internal Medicine

## 2023-01-11 NOTE — Telephone Encounter (Signed)
  Pharmacy comment: Alternative Requested:NO LONGER COVERED; BREO OR FLUTICASONE/SALMETEROL IS PREFERRED PRODUCT.   All Pharmacy Suggested Alternatives:  fluticasone-salmeterol (WIXELA INHUB) 250-50 MCG/ACT AEPB fluticasone furoate-vilanterol (BREO ELLIPTA) 100-25 MCG/ACT AEPB budesonide-formoterol (BREYNA) 160-4.5 MCG/ACT inhaler

## 2023-01-14 ENCOUNTER — Other Ambulatory Visit: Payer: Self-pay | Admitting: Physical Medicine & Rehabilitation

## 2023-01-18 ENCOUNTER — Telehealth: Payer: Self-pay | Admitting: *Deleted

## 2023-01-18 ENCOUNTER — Encounter: Payer: Self-pay | Admitting: *Deleted

## 2023-01-18 NOTE — Telephone Encounter (Signed)
Patient called requesting letter be sent on her behalf stating she does not benefit from Cymbalta in generic form. Letter will be generated and sent to pharmacy.

## 2023-01-19 NOTE — Telephone Encounter (Signed)
Completed by Golda Acre. Nothing further needed at this time.

## 2023-01-25 ENCOUNTER — Telehealth: Payer: Self-pay

## 2023-01-25 ENCOUNTER — Other Ambulatory Visit: Payer: Self-pay | Admitting: *Deleted

## 2023-01-25 NOTE — Telephone Encounter (Signed)
Approved 10/18/22-10/18/23

## 2023-01-26 MED ORDER — CYMBALTA 30 MG PO CPEP: 90.0000 mg | ORAL_CAPSULE | Freq: Every day | ORAL | 2 refills | Status: DC

## 2023-02-07 ENCOUNTER — Encounter: Payer: Self-pay | Admitting: Registered Nurse

## 2023-02-07 ENCOUNTER — Encounter: Payer: Worker's Compensation | Attending: Registered Nurse | Admitting: Registered Nurse

## 2023-02-07 ENCOUNTER — Telehealth: Payer: Self-pay | Admitting: *Deleted

## 2023-02-07 VITALS — BP 127/79 | HR 90 | Ht 68.0 in | Wt 174.0 lb

## 2023-02-07 DIAGNOSIS — M62838 Other muscle spasm: Secondary | ICD-10-CM | POA: Insufficient documentation

## 2023-02-07 DIAGNOSIS — G894 Chronic pain syndrome: Secondary | ICD-10-CM

## 2023-02-07 DIAGNOSIS — Z79891 Long term (current) use of opiate analgesic: Secondary | ICD-10-CM | POA: Diagnosis not present

## 2023-02-07 DIAGNOSIS — F3289 Other specified depressive episodes: Secondary | ICD-10-CM | POA: Insufficient documentation

## 2023-02-07 DIAGNOSIS — Z5181 Encounter for therapeutic drug level monitoring: Secondary | ICD-10-CM | POA: Diagnosis not present

## 2023-02-07 DIAGNOSIS — G4700 Insomnia, unspecified: Secondary | ICD-10-CM | POA: Diagnosis present

## 2023-02-07 DIAGNOSIS — G90512 Complex regional pain syndrome I of left upper limb: Secondary | ICD-10-CM | POA: Diagnosis not present

## 2023-02-07 MED ORDER — HYDROCODONE-ACETAMINOPHEN 5-325 MG PO TABS: 2.0000 | ORAL_TABLET | Freq: Two times a day (BID) | ORAL | 0 refills | Status: DC | PRN

## 2023-02-07 NOTE — Telephone Encounter (Signed)
Urine drug screen for this encounter is consistent for prescribed medication 

## 2023-02-07 NOTE — Progress Notes (Signed)
Subjective:    Patient ID: [name], female    DOB: Feb 27, 1966, 57 y.o.   MRN: [id number]  HPI: [name] is a 57 y.o. female who returns for follow up appointment for chronic pain and medication refill. She states her pain is located in her left arm. She rates her pain 7. Her current exercise regime is walking and performing stretching exercises.  [name] Morphine equivalent is 60.00 MME.   Last Oral Swab was Performed on 10/22/2022, it was consistent.    Pain Inventory Average Pain 7 Pain Right Now 7 My pain is constant, sharp, burning, stabbing, tingling, and aching  In the last 24 hours, has pain interfered with the following? General activity 4 Relation with others 4 Enjoyment of life 4 What TIME of day is your pain at its worst? morning , daytime, evening, and night Sleep (in general) Poor  Pain is worse with: walking, bending, sitting, standing, and some activites Pain improves with: rest, pacing activities, medication, and heat Relief from Meds: 6  Family History  Problem Relation Age of Onset   Cancer Mother        lung   Hypertension Mother    Cancer Father        colon   Hypertension Sister    Cancer Sister    Cancer Brother    Hyperlipidemia Sister    Birth defects Maternal Grandmother        breast   Breast cancer Maternal Grandmother    Birth defects Paternal Grandmother        uterine, stomach, lung   Breast cancer Paternal Grandmother    Breast cancer Paternal Aunt    Social History   Socioeconomic History   Marital status: Married    Spouse name: Not on file   Number of children: 2   Years of education: Not on file   Highest education level: Not on file  Occupational History   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  Tobacco Use   Smoking status: Never    Passive exposure: Current   Smokeless tobacco: Never  Vaping Use   Vaping Use: Never used  Substance and Sexual Activity   Alcohol use: Not Currently     Alcohol/week: 0.0 standard drinks of alcohol    Comment: 01/24/2013 "drink or 2 once or /twice/year, if that"   Drug use: No   Sexual activity: Not Currently  Other Topics Concern   Not on file  Social History Narrative   No living will   Would want husband as POA--then sister, [name]   Would accept resuscitation attempts   Probably would not want tube feeds if cognitively unaware   Social Determinants of Health   Financial Resource Strain: Not on file  Food Insecurity: Not on file  Transportation Needs: Not on file  Physical Activity: Not on file  Stress: Not on file  Social Connections: Not on file   Past Surgical History:  Procedure Laterality Date   APPENDECTOMY  ~ 06/2007   BLADDER REPAIR  ~ 06/2007   "same day after bladder lift" (01/24/2013)   BLADDER SUSPENSION  ~ 06/2007   BREAST BIOPSY Left    DIAGNOSTIC LAPAROSCOPY  1990's & ~ 2000   "I've had a couple; for endometrosis" (01/24/2013)   HERNIA REPAIR     INCISIONAL HERNIA REPAIR N/A 01/24/2013   Procedure: LAPAROSCOPIC INCISIONAL HERNIA;  Surgeon: Shelly Rubenstein, MD;  Location: [facility];  Service: General;  Laterality: N/A;   INSERTION OF MESH  N/A 01/24/2013   Procedure: INSERTION OF MESH;  Surgeon: Shelly Rubenstein, MD;  Location: [facility];  Service: General;  Laterality: N/A;   LAPAROSCOPIC INCISIONAL / UMBILICAL / [place] HERNIA REPAIR  01/24/2013   IHR w/mesh/notes   NASAL SEPTUM SURGERY  1980's?   OOPHORECTOMY Right 2009   TONSILLECTOMY  1990's   VAGINAL HYSTERECTOMY  ` 06/2007   Past Surgical History:  Procedure Laterality Date   APPENDECTOMY  ~ 06/2007   BLADDER REPAIR  ~ 06/2007   "same day after bladder lift" (01/24/2013)   BLADDER SUSPENSION  ~ 06/2007   BREAST BIOPSY Left    DIAGNOSTIC LAPAROSCOPY  1990's & ~ 2000   "I've had a couple; for endometrosis" (01/24/2013)   HERNIA REPAIR     INCISIONAL HERNIA REPAIR N/A 01/24/2013   Procedure: LAPAROSCOPIC INCISIONAL HERNIA;  Surgeon: Shelly Rubenstein, MD;  Location: [facility];  Service: General;  Laterality: N/A;   INSERTION OF MESH N/A 01/24/2013   Procedure: INSERTION OF MESH;  Surgeon: Shelly Rubenstein, MD;  Location: [facility];  Service: General;  Laterality: N/A;   LAPAROSCOPIC INCISIONAL / UMBILICAL / VENTRAL HERNIA REPAIR  01/24/2013   IHR w/mesh/notes   NASAL SEPTUM SURGERY  1980's?   OOPHORECTOMY Right 2009   TONSILLECTOMY  1990's   VAGINAL HYSTERECTOMY  ` 06/2007   Past Medical History:  Diagnosis Date   Allergy    Anxiety    Arthritis    "just the norm" (01/24/2013)   ASCVD (arteriosclerotic cardiovascular disease)    Asthma    Celiac disease    Daily headache    "depends on the season" (01/24/2013)   Diabetes mellitus without complication (HCC)    Fibromyalgia    GERD (gastroesophageal reflux disease)    History of colonic polyps    Hyperlipidemia    Hypertension    Migraines    Obesity (BMI 30.0-34.9)    RSD (reflex sympathetic dystrophy)    Sleep apnea    NO MACHINE RECOMMENDED   TIA (transient ischemic attack) ~ 2009   There were no vitals taken for this visit.  Opioid Risk Score:   Fall Risk Score:  `1  Depression screen PHQ 2/9     12/30/2022    1:27 PM 08/24/2022   12:52 PM 06/01/2022    1:10 PM 04/29/2022    1:49 PM 03/17/2022    1:27 PM 02/11/2022    1:36 PM 12/09/2021    1:22 PM  Depression screen PHQ 2/9  Decreased Interest 0 1 1 1 1  0 1  Down, Depressed, Hopeless 0 1 1 1 1  0 1  PHQ - 2 Score 0 2 2 2 2  0 2    Review of Systems  Musculoskeletal:        Pain in the left arm  All other systems reviewed and are negative.     Objective:   Physical Exam Vitals and nursing note reviewed.  Constitutional:      Appearance: Normal appearance.  Cardiovascular:     Rate and Rhythm: Normal rate and regular rhythm.     Pulses: Normal pulses.     Heart sounds: Normal heart sounds.  Pulmonary:     Effort: Pulmonary effort is normal.     Breath sounds: Normal breath sounds.  Musculoskeletal:     Cervical back: Normal range  of motion and neck supple.     Comments: Normal Muscle Bulk and Muscle Testing Reveals:  Upper Extremities: Right: Full ROM and Muscle  Strength 5/5 Left Upper Extremity: Decreased ROM 90 Degrees and Muscle Strength 3/5 Bilateral AC Joint Tenderness Thoracic Hypersensitivity Left Greater Trochanter Tenderness Lower Extremities: Decreased ROM and Muscle Strength 4/5 Bilateral Lower Extremities Flexion Produces Pain into her Bilateral Lower Extremities and Bilateral feet Arises from Table slowly  Antalgic Gait     Skin:    General: Skin is warm and dry.  Neurological:     Mental Status: She is alert and oriented to person, place, and time.  Psychiatric:        Mood and Affect: Mood normal.        Behavior: Behavior normal.         Assessment & Plan:  1.Complex Regional Pain Syndrome Type 1: Continue Topamax and Cymbalta. 02/07/2023. Refilled: Hydro-codone 7.5/325mg  one tablet twice a day one in the morning and one at bedtime #60. Continue Tramadol 100 mg ER daily #30 and Tramadol 50 mg one tablet by mouth BID. One tablet in the afternoon and one tablet in the evening. We will continue the opioid monitoring program, this consists of regular clinic visits, examinations, urine drug screen, pill counts as well as use of [place] Controlled Substance Reporting system. A 12 month History has been reviewed on the [place] Controlled Substance Reporting System  On 02/07/2023. 2. Depression: Continue current medication regimen with Cymbalta.02/07/2023 3. Insomnia: Continue current medication regimen with  Trazodone one and half tablet at bedtime  02/07/2023 4. Muscle Spasms: Continue current medication regimen with Tizanidine. 02/07/2023 5. Chronic Pain Syndrome: Continue Ibuprofen BID as needed. 02/07/2023   F/U in 1 month

## 2023-03-09 ENCOUNTER — Encounter: Payer: Self-pay | Admitting: Registered Nurse

## 2023-03-09 ENCOUNTER — Encounter: Payer: Worker's Compensation | Attending: Registered Nurse | Admitting: Registered Nurse

## 2023-03-09 VITALS — BP 133/84 | HR 84 | Ht 68.0 in | Wt 178.0 lb

## 2023-03-09 DIAGNOSIS — M62838 Other muscle spasm: Secondary | ICD-10-CM | POA: Diagnosis present

## 2023-03-09 DIAGNOSIS — G90512 Complex regional pain syndrome I of left upper limb: Secondary | ICD-10-CM | POA: Diagnosis not present

## 2023-03-09 DIAGNOSIS — Z5181 Encounter for therapeutic drug level monitoring: Secondary | ICD-10-CM | POA: Insufficient documentation

## 2023-03-09 DIAGNOSIS — G894 Chronic pain syndrome: Secondary | ICD-10-CM | POA: Diagnosis not present

## 2023-03-09 DIAGNOSIS — F3289 Other specified depressive episodes: Secondary | ICD-10-CM | POA: Insufficient documentation

## 2023-03-09 DIAGNOSIS — G4700 Insomnia, unspecified: Secondary | ICD-10-CM | POA: Diagnosis present

## 2023-03-09 DIAGNOSIS — Z79891 Long term (current) use of opiate analgesic: Secondary | ICD-10-CM | POA: Insufficient documentation

## 2023-03-09 MED ORDER — TRAMADOL HCL 50 MG PO TABS: ORAL_TABLET | ORAL | 2 refills | Status: DC

## 2023-03-09 MED ORDER — TRAMADOL HCL ER 100 MG PO TB24: 100.0000 mg | ORAL_TABLET | Freq: Every day | ORAL | 2 refills | Status: DC

## 2023-03-09 MED ORDER — HYDROCODONE-ACETAMINOPHEN 5-325 MG PO TABS: 2.0000 | ORAL_TABLET | Freq: Two times a day (BID) | ORAL | 0 refills | Status: DC | PRN

## 2023-03-09 NOTE — Progress Notes (Signed)
Subjective:    Patient ID: [name], female    DOB: 08-Mar-1966, 57 y.o.   MRN: [id number]  HPI: [name] is a 57 y.o. female who returns for follow up appointment for chronic pain and medication refill. She states her pain is located in her left arm and left wrist. She rates her pain 6. Her current exercise regime is walking and performing stretching exercises.  [name] Morphine equivalent is 60.00 MME.   Last Oral Swab was Performed on 10/22/2022, it was consistent.     Pain Inventory Average Pain 7 Pain Right Now 6 My pain is constant, sharp, burning, stabbing, tingling, and aching  In the last 24 hours, has pain interfered with the following? General activity 5 Relation with others 5 Enjoyment of life 5 What TIME of day is your pain at its worst? morning , daytime, evening, and night Sleep (in general) Poor  Pain is worse with: walking, bending, sitting, standing, and some activites Pain improves with: rest, heat/ice, pacing activities, medication, and TENS Relief from Meds: 6  Family History  Problem Relation Age of Onset   Cancer Mother        lung   Hypertension Mother    Cancer Father        colon   Hypertension Sister    Cancer Sister    Cancer Brother    Hyperlipidemia Sister    Birth defects Maternal Grandmother        breast   Breast cancer Maternal Grandmother    Birth defects Paternal Grandmother        uterine, stomach, lung   Breast cancer Paternal Grandmother    Breast cancer Paternal Aunt    Social History   Socioeconomic History   Marital status: Married    Spouse name: Not on file   Number of children: 2   Years of education: Not on file   Highest education level: Not on file  Occupational History   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  Tobacco Use   Smoking status: Never    Passive exposure: Current   Smokeless tobacco: Never  Vaping Use   Vaping Use: Never used  Substance and Sexual Activity    Alcohol use: Not Currently    Alcohol/week: 0.0 standard drinks of alcohol    Comment: 01/24/2013 "drink or 2 once or /twice/year, if that"   Drug use: No   Sexual activity: Not Currently  Other Topics Concern   Not on file  Social History Narrative   No living will   Would want husband as POA--then sister, [name]   Would accept resuscitation attempts   Probably would not want tube feeds if cognitively unaware   Social Determinants of Health   Financial Resource Strain: Not on file  Food Insecurity: Not on file  Transportation Needs: Not on file  Physical Activity: Not on file  Stress: Not on file  Social Connections: Not on file   Past Surgical History:  Procedure Laterality Date   APPENDECTOMY  ~ 06/2007   BLADDER REPAIR  ~ 06/2007   "same day after bladder lift" (01/24/2013)   BLADDER SUSPENSION  ~ 06/2007   BREAST BIOPSY Left    DIAGNOSTIC LAPAROSCOPY  1990's & ~ 2000   "I've had a couple; for endometrosis" (01/24/2013)   HERNIA REPAIR     INCISIONAL HERNIA REPAIR N/A 01/24/2013   Procedure: LAPAROSCOPIC INCISIONAL HERNIA;  Surgeon: Shelly Rubenstein, MD;  [facility];  Service: General;  Laterality: N/A;  INSERTION OF MESH N/A 01/24/2013   Procedure: INSERTION OF MESH;  Surgeon: Shelly Rubenstein, MD;  [facility];  Service: General;  Laterality: N/A;   LAPAROSCOPIC INCISIONAL / UMBILICAL / VENTRAL HERNIA REPAIR  01/24/2013   IHR w/mesh/notes   NASAL SEPTUM SURGERY  1980's?   OOPHORECTOMY Right 2009   TONSILLECTOMY  1990's   VAGINAL HYSTERECTOMY  ` 06/2007   Past Surgical History:  Procedure Laterality Date   APPENDECTOMY  ~ 06/2007   BLADDER REPAIR  ~ 06/2007   "same day after bladder lift" (01/24/2013)   BLADDER SUSPENSION  ~ 06/2007   BREAST BIOPSY Left    DIAGNOSTIC LAPAROSCOPY  1990's & ~ 2000   "I've had a couple; for endometrosis" (01/24/2013)   HERNIA REPAIR     INCISIONAL HERNIA REPAIR N/A 01/24/2013   Procedure: LAPAROSCOPIC INCISIONAL HERNIA;  Surgeon: Shelly Rubenstein, MD;  [facility];  Service: General;  Laterality: N/A;   INSERTION OF MESH N/A 01/24/2013   Procedure: INSERTION OF MESH;  Surgeon: Shelly Rubenstein, MD;  [facility];  Service: General;  Laterality: N/A;   LAPAROSCOPIC INCISIONAL / UMBILICAL / VENTRAL HERNIA REPAIR  01/24/2013   IHR w/mesh/notes   NASAL SEPTUM SURGERY  1980's?   OOPHORECTOMY Right 2009   TONSILLECTOMY  1990's   VAGINAL HYSTERECTOMY  ` 06/2007   Past Medical History:  Diagnosis Date   Allergy    Anxiety    Arthritis    "just the norm" (01/24/2013)   ASCVD (arteriosclerotic cardiovascular disease)    Asthma    Celiac disease    Daily headache    "depends on the season" (01/24/2013)   Diabetes mellitus without complication (HCC)    Fibromyalgia    GERD (gastroesophageal reflux disease)    History of colonic polyps    Hyperlipidemia    Hypertension    Migraines    Obesity (BMI 30.0-34.9)    RSD (reflex sympathetic dystrophy)    Sleep apnea    NO MACHINE RECOMMENDED   TIA (transient ischemic attack) ~ 2009   BP 133/84   Pulse 84   Ht 5\' 8"  (1.727 m)   Wt 178 lb (80.7 kg)   SpO2 97%   BMI 27.06 kg/m   Opioid Risk Score:   Fall Risk Score:  `1  Depression screen PHQ 2/9     02/07/2023    1:16 PM 12/30/2022    1:27 PM 08/24/2022   12:52 PM 06/01/2022    1:10 PM 04/29/2022    1:49 PM 03/17/2022    1:27 PM 02/11/2022    1:36 PM  Depression screen PHQ 2/9  Decreased Interest 1 0 1 1 1 1  0  Down, Depressed, Hopeless 1 0 1 1 1 1  0  PHQ - 2 Score 2 0 2 2 2 2  0     Review of Systems  Musculoskeletal:        Left hand and arm pain  All other systems reviewed and are negative.     Objective:   Physical Exam Vitals and nursing note reviewed.  Constitutional:      Appearance: Normal appearance.  Cardiovascular:     Rate and Rhythm: Normal rate and regular rhythm.     Pulses: Normal pulses.     Heart sounds: Normal heart sounds.  Pulmonary:     Effort: Pulmonary effort is normal.      Breath sounds: Normal breath sounds.  Musculoskeletal:     Cervical back: Normal  range of motion and neck supple.     Comments: Normal Muscle Bulk and Muscle Testing Reveals:  Upper Extremities: Right Upper Extremity: Full ROM and Muscle Strength 5/5 Left Upper Extremity: Decreased ROM 45 Degrees and Muscle Strength 2/5  Lower Extremities: Decreased ROM and Muscle Strength 4/5 Bilateral Lower Extremities Flexion Produces Pain into her Bilateral Lower Extremities and Bilateral Feet Arises from Table slowly Antalgic  Gait     Skin:    General: Skin is warm and dry.  Neurological:     Mental Status: She is alert and oriented to person, place, and time.  Psychiatric:        Mood and Affect: Mood normal.        Behavior: Behavior normal.         Assessment & Plan:  1.Complex Regional Pain Syndrome Type 1: Continue Topamax and Cymbalta. 03/09/2023. Refilled: Hydro-codone 7.5/325mg  one tablet twice a day one in the morning and one at bedtime #60. Continue Tramadol 100 mg ER daily #30 and Tramadol 50 mg one tablet by mouth BID. One tablet in the afternoon and one tablet in the evening. We will continue the opioid monitoring program, this consists of regular clinic visits, examinations, urine drug screen, pill counts as well as use of [place] Controlled Substance Reporting system. A 12 month History has been reviewed on the [place] Controlled Substance Reporting System  On 03/09/2023. 2. Depression: Continue current medication regimen with Cymbalta.03/09/2023 3. Insomnia: Continue current medication regimen with  Trazodone one and half tablet at bedtime  03/09/2023 4. Muscle Spasms: Continue current medication regimen with Tizanidine. 03/09/2023 5. Chronic Pain Syndrome: Continue Ibuprofen BID as needed. 03/09/2023   F/U in 1 month

## 2023-03-13 ENCOUNTER — Other Ambulatory Visit: Payer: Self-pay | Admitting: Internal Medicine

## 2023-03-31 DIAGNOSIS — H25013 Cortical age-related cataract, bilateral: Secondary | ICD-10-CM | POA: Diagnosis not present

## 2023-03-31 DIAGNOSIS — E119 Type 2 diabetes mellitus without complications: Secondary | ICD-10-CM | POA: Diagnosis not present

## 2023-03-31 DIAGNOSIS — H5203 Hypermetropia, bilateral: Secondary | ICD-10-CM | POA: Diagnosis not present

## 2023-03-31 DIAGNOSIS — H2513 Age-related nuclear cataract, bilateral: Secondary | ICD-10-CM | POA: Diagnosis not present

## 2023-03-31 DIAGNOSIS — H52223 Regular astigmatism, bilateral: Secondary | ICD-10-CM | POA: Diagnosis not present

## 2023-03-31 DIAGNOSIS — H524 Presbyopia: Secondary | ICD-10-CM | POA: Diagnosis not present

## 2023-04-06 ENCOUNTER — Encounter: Payer: Worker's Compensation | Attending: Registered Nurse | Admitting: Registered Nurse

## 2023-04-06 ENCOUNTER — Encounter: Payer: Self-pay | Admitting: Registered Nurse

## 2023-04-06 VITALS — BP 126/76 | HR 96 | Ht 68.0 in | Wt 178.0 lb

## 2023-04-06 DIAGNOSIS — Z5181 Encounter for therapeutic drug level monitoring: Secondary | ICD-10-CM | POA: Diagnosis not present

## 2023-04-06 DIAGNOSIS — F3289 Other specified depressive episodes: Secondary | ICD-10-CM | POA: Diagnosis present

## 2023-04-06 DIAGNOSIS — Z79891 Long term (current) use of opiate analgesic: Secondary | ICD-10-CM | POA: Diagnosis not present

## 2023-04-06 DIAGNOSIS — G4700 Insomnia, unspecified: Secondary | ICD-10-CM | POA: Diagnosis present

## 2023-04-06 DIAGNOSIS — G894 Chronic pain syndrome: Secondary | ICD-10-CM

## 2023-04-06 DIAGNOSIS — M62838 Other muscle spasm: Secondary | ICD-10-CM

## 2023-04-06 DIAGNOSIS — G90512 Complex regional pain syndrome I of left upper limb: Secondary | ICD-10-CM | POA: Diagnosis not present

## 2023-04-06 MED ORDER — HYDROCODONE-ACETAMINOPHEN 5-325 MG PO TABS: 2.0000 | ORAL_TABLET | Freq: Two times a day (BID) | ORAL | 0 refills | Status: DC | PRN

## 2023-04-06 NOTE — Progress Notes (Signed)
Subjective:    Patient ID: [name], female    DOB: August 17, 1966, 57 y.o.   MRN: [id number]  HPI: [name] is a 57 y.o. female who returns for follow up appointment for chronic pain and medication refill. She states her pain is located in  her left arm and left hand. She rates her pain 7. Her current exercise regime is walking and performing stretching exercises.  [name] Morphine equivalent is 60.00 MME.   Last Oral Swab was Performed 10/22/2022, it was consistent.      Pain Inventory Average Pain 7 Pain Right Now 7 My pain is constant, sharp, burning, stabbing, tingling, and aching  In the last 24 hours, has pain interfered with the following? General activity 6 Relation with others 6 Enjoyment of life 6 What TIME of day is your pain at its worst? morning , daytime, evening, and night Sleep (in general) Poor  Pain is worse with: walking, bending, sitting, standing, and some activites Pain improves with: rest, heat/ice, pacing activities, medication, and TENS Relief from Meds: 7  Family History  Problem Relation Age of Onset   Cancer Mother        lung   Hypertension Mother    Cancer Father        colon   Hypertension Sister    Cancer Sister    Cancer Brother    Hyperlipidemia Sister    Birth defects Maternal Grandmother        breast   Breast cancer Maternal Grandmother    Birth defects Paternal Grandmother        uterine, stomach, lung   Breast cancer Paternal Grandmother    Breast cancer Paternal Aunt    Social History   Socioeconomic History   Marital status: Married    Spouse name: Not on file   Number of children: 2   Years of education: Not on file   Highest education level: Not on file  Occupational History   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  Tobacco Use   Smoking status: Never    Passive exposure: Current   Smokeless tobacco: Never  Vaping Use   Vaping Use: Never used  Substance and Sexual Activity    Alcohol use: Not Currently    Alcohol/week: 0.0 standard drinks of alcohol    Comment: 01/24/2013 "drink or 2 once or /twice/year, if that"   Drug use: No   Sexual activity: Not Currently  Other Topics Concern   Not on file  Social History Narrative   No living will   Would want husband as POA--then sister, [name]   Would accept resuscitation attempts   Probably would not want tube feeds if cognitively unaware   Social Determinants of Health   Financial Resource Strain: Not on file  Food Insecurity: Not on file  Transportation Needs: Not on file  Physical Activity: Not on file  Stress: Not on file  Social Connections: Not on file   Past Surgical History:  Procedure Laterality Date   APPENDECTOMY  ~ 06/2007   BLADDER REPAIR  ~ 06/2007   "same day after bladder lift" (01/24/2013)   BLADDER SUSPENSION  ~ 06/2007   BREAST BIOPSY Left    DIAGNOSTIC LAPAROSCOPY  1990's & ~ 2000   "I've had a couple; for endometrosis" (01/24/2013)   HERNIA REPAIR     INCISIONAL HERNIA REPAIR N/A 01/24/2013   Procedure: LAPAROSCOPIC INCISIONAL HERNIA;  Surgeon: Shelly Rubenstein, MD;  [facility];  Service: General;  Laterality: N/A;   INSERTION OF MESH N/A 01/24/2013   Procedure: INSERTION OF MESH;  Surgeon: Shelly Rubenstein, MD;  [facility];  Service: General;  Laterality: N/A;   LAPAROSCOPIC INCISIONAL / UMBILICAL / VENTRAL HERNIA REPAIR  01/24/2013   IHR w/mesh/notes   NASAL SEPTUM SURGERY  1980's?   OOPHORECTOMY Right 2009   TONSILLECTOMY  1990's   VAGINAL HYSTERECTOMY  ` 06/2007   Past Surgical History:  Procedure Laterality Date   APPENDECTOMY  ~ 06/2007   BLADDER REPAIR  ~ 06/2007   "same day after bladder lift" (01/24/2013)   BLADDER SUSPENSION  ~ 06/2007   BREAST BIOPSY Left    DIAGNOSTIC LAPAROSCOPY  1990's & ~ 2000   "I've had a couple; for endometrosis" (01/24/2013)   HERNIA REPAIR     INCISIONAL HERNIA REPAIR N/A 01/24/2013   Procedure: LAPAROSCOPIC INCISIONAL HERNIA;  Surgeon: Shelly Rubenstein, MD;  [facility];  Service: General;  Laterality: N/A;   INSERTION OF MESH N/A 01/24/2013   Procedure: INSERTION OF MESH;  Surgeon: Shelly Rubenstein, MD;  [facility];  Service: General;  Laterality: N/A;   LAPAROSCOPIC INCISIONAL / UMBILICAL / VENTRAL HERNIA REPAIR  01/24/2013   IHR w/mesh/notes   NASAL SEPTUM SURGERY  1980's?   OOPHORECTOMY Right 2009   TONSILLECTOMY  1990's   VAGINAL HYSTERECTOMY  ` 06/2007   Past Medical History:  Diagnosis Date   Allergy    Anxiety    Arthritis    "just the norm" (01/24/2013)   ASCVD (arteriosclerotic cardiovascular disease)    Asthma    Celiac disease    Daily headache    "depends on the season" (01/24/2013)   Diabetes mellitus without complication (HCC)    Fibromyalgia    GERD (gastroesophageal reflux disease)    History of colonic polyps    Hyperlipidemia    Hypertension    Migraines    Obesity (BMI 30.0-34.9)    RSD (reflex sympathetic dystrophy)    Sleep apnea    NO MACHINE RECOMMENDED   TIA (transient ischemic attack) ~ 2009   There were no vitals taken for this visit.  Opioid Risk Score:   Fall Risk Score:  `1  Depression screen PHQ 2/9     02/07/2023    1:16 PM 12/30/2022    1:27 PM 08/24/2022   12:52 PM 06/01/2022    1:10 PM 04/29/2022    1:49 PM 03/17/2022    1:27 PM 02/11/2022    1:36 PM  Depression screen PHQ 2/9  Decreased Interest 1 0 1 1 1 1  0  Down, Depressed, Hopeless 1 0 1 1 1 1  0  PHQ - 2 Score 2 0 2 2 2 2  0    Review of Systems  Musculoskeletal:        Left arm pain  All other systems reviewed and are negative.     Objective:   Physical Exam Vitals and nursing note reviewed.  Constitutional:      Appearance: Normal appearance.  Cardiovascular:     Rate and Rhythm: Normal rate and regular rhythm.     Pulses: Normal pulses.     Heart sounds: Normal heart sounds.  Pulmonary:     Effort: Pulmonary effort is normal.     Breath sounds: Normal breath sounds.  Musculoskeletal:      Cervical back: Normal range of motion and neck supple.     Comments: Normal Muscle Bulk and Muscle Testing Reveals:  Upper Extremities:  Right: Full ROM and Muscle Strength 5/5 Left Upper Extremity: Decreased ROM 90 Degrees and Muscle Strength 4/5 Thoracic Paraspinal Tenderness: T-7-T-9 Lower Extremities: Full ROM and Muscle Strength 5/5 Bilateral Lower Extremities Flexion Produces Pain into her Bilateral Lower Extremities Arises from Table Slowly Narrow based  Gait     Skin:    General: Skin is warm and dry.  Neurological:     Mental Status: She is alert and oriented to person, place, and time.  Psychiatric:        Mood and Affect: Mood normal.        Behavior: Behavior normal.         Assessment & Plan:  1.Complex Regional Pain Syndrome Type 1: Continue Topamax and Cymbalta. 04/06/2023. Refilled: Hydro-codone 7.5/325mg  one tablet twice a day one in the morning and one at bedtime #60. Continue Tramadol 100 mg ER daily #30 and Tramadol 50 mg one tablet by mouth BID. One tablet in the afternoon and one tablet in the evening. We will continue the opioid monitoring program, this consists of regular clinic visits, examinations, urine drug screen, pill counts as well as use of [place] Controlled Substance Reporting system. A 12 month History has been reviewed on the [place] Controlled Substance Reporting System  On 04/06/2023. 2. Depression: Continue current medication regimen with Cymbalta.04/06/2023 3. Insomnia: Continue current medication regimen with  Trazodone one and half tablet at bedtime  04/06/2023 4. Muscle Spasms: Continue current medication regimen with Tizanidine. 04/06/2023 5. Chronic Pain Syndrome: Continue Ibuprofen BID as needed. 04/06/2023   F/U in 1 month

## 2023-04-13 ENCOUNTER — Other Ambulatory Visit: Payer: Self-pay | Admitting: Physical Medicine & Rehabilitation

## 2023-04-21 ENCOUNTER — Other Ambulatory Visit: Payer: Self-pay | Admitting: Registered Nurse

## 2023-04-22 ENCOUNTER — Other Ambulatory Visit: Payer: Self-pay | Admitting: *Deleted

## 2023-04-22 ENCOUNTER — Other Ambulatory Visit: Payer: Self-pay | Admitting: Registered Nurse

## 2023-04-22 MED ORDER — DULOXETINE HCL 30 MG PO CPEP: 90.0000 mg | ORAL_CAPSULE | Freq: Every day | ORAL | 2 refills | Status: DC

## 2023-04-25 ENCOUNTER — Telehealth: Payer: Self-pay | Admitting: Registered Nurse

## 2023-04-25 ENCOUNTER — Other Ambulatory Visit: Payer: Self-pay | Admitting: Registered Nurse

## 2023-04-25 ENCOUNTER — Telehealth: Payer: Self-pay | Admitting: *Deleted

## 2023-04-25 MED ORDER — DULOXETINE HCL 30 MG PO CPEP: 90.0000 mg | ORAL_CAPSULE | Freq: Every day | ORAL | 2 refills | Status: DC

## 2023-04-25 MED ORDER — DULOXETINE HCL 30 MG PO CPEP: 30.0000 mg | ORAL_CAPSULE | Freq: Every day | ORAL | 3 refills | Status: DC

## 2023-04-25 MED ORDER — DULOXETINE HCL 60 MG PO CPEP: 60.0000 mg | ORAL_CAPSULE | Freq: Every day | ORAL | 3 refills | Status: DC

## 2023-04-25 NOTE — Telephone Encounter (Signed)
Cymbalta prescription sent to pharmacy.

## 2023-04-25 NOTE — Telephone Encounter (Signed)
Call placed to [facility] spoke with Pharmacist.  Call placed to Ms. Giarraputo/ [place]: Cymbalta prescriptions sent to pharmacy, she verbalizes understanding.

## 2023-04-25 NOTE — Telephone Encounter (Signed)
Cymbalta PA Contact plan to follow up on [id number]

## 2023-04-26 NOTE — Addendum Note (Signed)
Addended by: Janean Sark on: 04/26/2023 11:12 AM   Modules accepted: Orders

## 2023-04-26 NOTE — Telephone Encounter (Signed)
Issue resolved ins approved 30 mg tid. Patient will be contacted by pharmacy one they receive medication. 60 mg Cymbalta d/c in chart.

## 2023-05-12 ENCOUNTER — Encounter: Payer: Self-pay | Admitting: Registered Nurse

## 2023-05-12 ENCOUNTER — Encounter: Payer: Worker's Compensation | Attending: Registered Nurse | Admitting: Registered Nurse

## 2023-05-12 VITALS — BP 128/75 | HR 98 | Ht 67.0 in | Wt 181.0 lb

## 2023-05-12 DIAGNOSIS — G4700 Insomnia, unspecified: Secondary | ICD-10-CM | POA: Insufficient documentation

## 2023-05-12 DIAGNOSIS — Z5181 Encounter for therapeutic drug level monitoring: Secondary | ICD-10-CM | POA: Insufficient documentation

## 2023-05-12 DIAGNOSIS — F3289 Other specified depressive episodes: Secondary | ICD-10-CM | POA: Insufficient documentation

## 2023-05-12 DIAGNOSIS — M62838 Other muscle spasm: Secondary | ICD-10-CM | POA: Diagnosis present

## 2023-05-12 DIAGNOSIS — G894 Chronic pain syndrome: Secondary | ICD-10-CM | POA: Diagnosis present

## 2023-05-12 DIAGNOSIS — G90512 Complex regional pain syndrome I of left upper limb: Secondary | ICD-10-CM | POA: Diagnosis present

## 2023-05-12 DIAGNOSIS — Z79891 Long term (current) use of opiate analgesic: Secondary | ICD-10-CM | POA: Diagnosis present

## 2023-05-12 MED ORDER — TIZANIDINE HCL 4 MG PO TABS: 4.0000 mg | ORAL_TABLET | Freq: Four times a day (QID) | ORAL | 3 refills | Status: DC | PRN

## 2023-05-12 MED ORDER — HYDROCODONE-ACETAMINOPHEN 5-325 MG PO TABS: 2.0000 | ORAL_TABLET | Freq: Two times a day (BID) | ORAL | 0 refills | Status: DC | PRN

## 2023-05-12 NOTE — Progress Notes (Signed)
Subjective:    Patient ID: [name], female    DOB: [birth year], 57 y.o.   MRN: [id number]  HPI: [name] is a 57 y.o. female who returns for follow up appointment for chronic pain and medication refill. She states her pain is located in her left arm, left wrist and left hand. She rates her pain 7. Her current exercise regime is walking  short distances and performing stretching exercises.  Ms. Nobel Morphine equivalent is 60.00 MME.   Oral Swab was Ordered today.    Pain Inventory Average Pain 7 Pain Right Now 7 My pain is constant, sharp, burning, stabbing, tingling, and aching  In the last 24 hours, has pain interfered with the following? General activity 5 Relation with others 5 Enjoyment of life 5 What TIME of day is your pain at its worst? morning , daytime, evening, and night Sleep (in general) Poor  Pain is worse with: walking, bending, inactivity, standing, and some activites Pain improves with: rest, heat/ice, therapy/exercise, pacing activities, medication, and TENS Relief from Meds: 6  Family History  Problem Relation Age of Onset   Cancer Mother        lung   Hypertension Mother    Cancer Father        colon   Hypertension Sister    Cancer Sister    Cancer Brother    Hyperlipidemia Sister    Birth defects Maternal Grandmother        breast   Breast cancer Maternal Grandmother    Birth defects Paternal Grandmother        uterine, stomach, lung   Breast cancer Paternal Grandmother    Breast cancer Paternal Aunt    Social History   Socioeconomic History   Marital status: Married    Spouse name: Not on file   Number of children: 2   Years of education: Not on file   Highest education level: Not on file  Occupational History   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  Tobacco Use   Smoking status: Never    Passive exposure: Current   Smokeless tobacco: Never  Vaping Use   Vaping status: Never Used  Substance and Sexual  Activity   Alcohol use: Not Currently    Alcohol/week: 0.0 standard drinks of alcohol    Comment: 01/24/2013 "drink or 2 once or /twice/year, if that"   Drug use: No   Sexual activity: Not Currently  Other Topics Concern   Not on file  Social History Narrative   No living will   Would want husband as POA--then sister, [name]   Would accept resuscitation attempts   Probably would not want tube feeds if cognitively unaware   Social Determinants of Health   Financial Resource Strain: Not on file  Food Insecurity: Not on file  Transportation Needs: Not on file  Physical Activity: Not on file  Stress: Not on file  Social Connections: Not on file   Past Surgical History:  Procedure Laterality Date   APPENDECTOMY  ~ 06/2007   BLADDER REPAIR  ~ 06/2007   "same day after bladder lift" (01/24/2013)   BLADDER SUSPENSION  ~ 06/2007   BREAST BIOPSY Left    DIAGNOSTIC LAPAROSCOPY  1990's & ~ 2000   "I've had a couple; for endometrosis" (01/24/2013)   HERNIA REPAIR     INCISIONAL HERNIA REPAIR N/A 01/24/2013   Procedure: LAPAROSCOPIC INCISIONAL HERNIA;  Surgeon: Shelly Rubenstein, MD;  [facility];  Service: General;  Laterality: N/A;  INSERTION OF MESH N/A 01/24/2013   Procedure: INSERTION OF MESH;  Surgeon: Shelly Rubenstein, MD;  [facility];  Service: General;  Laterality: N/A;   LAPAROSCOPIC INCISIONAL / UMBILICAL / VENTRAL HERNIA REPAIR  01/24/2013   IHR w/mesh/notes   NASAL SEPTUM SURGERY  1980's?   OOPHORECTOMY Right 2009   TONSILLECTOMY  1990's   VAGINAL HYSTERECTOMY  ` 06/2007   Past Surgical History:  Procedure Laterality Date   APPENDECTOMY  ~ 06/2007   BLADDER REPAIR  ~ 06/2007   "same day after bladder lift" (01/24/2013)   BLADDER SUSPENSION  ~ 06/2007   BREAST BIOPSY Left    DIAGNOSTIC LAPAROSCOPY  1990's & ~ 2000   "I've had a couple; for endometrosis" (01/24/2013)   HERNIA REPAIR     INCISIONAL HERNIA REPAIR N/A 01/24/2013   Procedure: LAPAROSCOPIC INCISIONAL HERNIA;   Surgeon: Shelly Rubenstein, MD;  [facility];  Service: General;  Laterality: N/A;   INSERTION OF MESH N/A 01/24/2013   Procedure: INSERTION OF MESH;  Surgeon: Shelly Rubenstein, MD;  [facility]: [facility];  Service: General;  Laterality: N/A;   LAPAROSCOPIC INCISIONAL / UMBILICAL / VENTRAL HERNIA REPAIR  01/24/2013   IHR w/mesh/notes   NASAL SEPTUM SURGERY  1980's?   OOPHORECTOMY Right 2009   TONSILLECTOMY  1990's   VAGINAL HYSTERECTOMY  ` 06/2007   Past Medical History:  Diagnosis Date   Allergy    Anxiety    Arthritis    "just the norm" (01/24/2013)   ASCVD (arteriosclerotic cardiovascular disease)    Asthma    Celiac disease    Daily headache    "depends on the season" (01/24/2013)   Diabetes mellitus without complication (HCC)    Fibromyalgia    GERD (gastroesophageal reflux disease)    History of colonic polyps    Hyperlipidemia    Hypertension    Migraines    Obesity (BMI 30.0-34.9)    RSD (reflex sympathetic dystrophy)    Sleep apnea    NO MACHINE RECOMMENDED   TIA (transient ischemic attack) ~ 2009   BP 128/75   Pulse (!) 102   Ht 5\' 7"  (1.702 m)   Wt 181 lb (82.1 kg)   SpO2 96%   BMI 28.35 kg/m   Opioid Risk Score:   Fall Risk Score:  `1  Depression screen PHQ 2/9     04/06/2023   12:03 PM 02/07/2023    1:16 PM 12/30/2022    1:27 PM 08/24/2022   12:52 PM 06/01/2022    1:10 PM 04/29/2022    1:49 PM 03/17/2022    1:27 PM  Depression screen PHQ 2/9  Decreased Interest 1 1 0 1 1 1 1   Down, Depressed, Hopeless 1 1 0 1 1 1 1   PHQ - 2 Score 2 2 0 2 2 2 2       Review of Systems  Musculoskeletal:        Left arm pain  All other systems reviewed and are negative.     Objective:   Physical Exam Vitals and nursing note reviewed.  Constitutional:      Appearance: Normal appearance.  Neck:     Comments: Cervical Paraspinal Tenderness: C-5-C-6 Cardiovascular:     Rate and Rhythm: Normal rate and regular rhythm.  Pulmonary:     Effort: Pulmonary effort is  normal.     Breath sounds: Normal breath sounds.  Musculoskeletal:     Cervical back: Normal range of motion and neck supple.  Comments: Normal Muscle Bulk and Muscle Testing Reveals:  Upper Extremities: Right: Full ROM and Muscle Strength 5/5 Left Upper Extremity: Decreased ROM 90 Degrees and Muscle Strength 3/5 Bilateral AC Joint Tenderness Thoracic Paraspinal Tenderness: T-7-T-9 Lower Extremities: Decreased ROM and Muscle Strength 5/5 Bilateral Lower Extremities Flexion Produces Pain into bilateral lower extremities Arises from Table slowly Antalgic  Gait     Skin:    General: Skin is warm and dry.  Neurological:     Mental Status: She is alert and oriented to person, place, and time.  Psychiatric:        Mood and Affect: Mood normal.        Behavior: Behavior normal.         Assessment & Plan:  1.Complex Regional Pain Syndrome Type 1: Continue Topamax and Cymbalta. 05/12/2023. Refilled: Hydro-codone 5/325mg  two tablets twice a day as needed for pain #60. Continue Tramadol 100 mg ER daily #30 and Tramadol 50 mg one tablet by mouth BID. One tablet in the afternoon and one tablet in the evening. We will continue the opioid monitoring program, this consists of regular clinic visits, examinations, urine drug screen, pill counts as well as use of [place] Controlled Substance Reporting system. A 12 month History has been reviewed on the [place] Controlled Substance Reporting System  On 05/12/2023. 2. Depression: Continue current medication regimen with Cymbalta.05/12/2023 3. Insomnia: Continue current medication regimen with  Trazodone one and half tablet at bedtime  05/12/2023 4. Muscle Spasms: Continue current medication regimen with Tizanidine. 05/12/2023 5. Chronic Pain Syndrome: Continue Ibuprofen BID as needed. 05/12/2023   F/U in 1 month

## 2023-05-19 ENCOUNTER — Telehealth: Payer: Self-pay | Admitting: Registered Nurse

## 2023-05-19 NOTE — Telephone Encounter (Signed)
The patient called and said the pharmacy [facility] sent her hydrocodone to was out. She wanted her to resend it to the [facility] in [place] the phone number is [phone]

## 2023-05-19 NOTE — Telephone Encounter (Signed)
I've called the pharmacy to cancel original script Hydrocodone/APAP. Please resend to [facility] added pharmacy.

## 2023-05-20 ENCOUNTER — Other Ambulatory Visit: Payer: Self-pay

## 2023-05-20 MED ORDER — HYDROCODONE-ACETAMINOPHEN 5-325 MG PO TABS: 2.0000 | ORAL_TABLET | Freq: Two times a day (BID) | ORAL | 0 refills | Status: DC | PRN

## 2023-05-20 NOTE — Telephone Encounter (Signed)
PMP was Reviewed.  Hydrocodone e-scribed to pharmacy.  Call Placed tp [name] was called and aware of the above.

## 2023-05-20 NOTE — Telephone Encounter (Signed)
Please send th Hydrocodone 5-325 MG to [facility] in [place]. Prior Rx will be cancelled.

## 2023-06-02 ENCOUNTER — Encounter (INDEPENDENT_AMBULATORY_CARE_PROVIDER_SITE_OTHER): Payer: Self-pay

## 2023-06-07 NOTE — Progress Notes (Signed)
Subjective:    Patient ID: [name], female    DOB: 29-Nov-1965, 57 y.o.   MRN: [id number]  HPI: [name] is a 57 y.o. female who returns for follow up appointment for chronic pain and medication refill. She states her pain is located in her left arm. She rates her pain 7. Her current exercise regime is walking and performing stretching exercises.  [name] Morphine equivalent is 60.00 MME.   Last Oral Swab was Performed on 05/12/2023, it was consistent.    Pain Inventory Average Pain 8 Pain Right Now 7 My pain is intermittent, constant, sharp, burning, stabbing, tingling, and aching  In the last 24 hours, has pain interfered with the following? General activity 6 Relation with others 6 Enjoyment of life 6 What TIME of day is your pain at its worst? morning , daytime, evening, and night Sleep (in general) Poor  Pain is worse with: walking, bending, sitting, standing, and some activites Pain improves with: rest, therapy/exercise, pacing activities, medication, and   Relief from Meds: 6  Family History  Problem Relation Age of Onset   Cancer Mother        lung   Hypertension Mother    Cancer Father        colon   Hypertension Sister    Cancer Sister    Cancer Brother    Hyperlipidemia Sister    Birth defects Maternal Grandmother        breast   Breast cancer Maternal Grandmother    Birth defects Paternal Grandmother        uterine, stomach, lung   Breast cancer Paternal Grandmother    Breast cancer Paternal Aunt    Social History   Socioeconomic History   Marital status: Married    Spouse name: Not on file   Number of children: 2   Years of education: Not on file   Highest education level: Not on file  Occupational History   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  Tobacco Use   Smoking status: Never    Passive exposure: Current   Smokeless tobacco: Never  Vaping Use   Vaping status: Never Used  Substance and Sexual Activity    Alcohol use: Not Currently    Alcohol/week: 0.0 standard drinks of alcohol    Comment: 01/24/2013 "drink or 2 once or /twice/year, if that"   Drug use: No   Sexual activity: Not Currently  Other Topics Concern   Not on file  Social History Narrative   No living will   Would want husband as POA--then sister, [name]   Would accept resuscitation attempts   Probably would not want tube feeds if cognitively unaware   Social Determinants of Health   Financial Resource Strain: Not on file  Food Insecurity: Not on file  Transportation Needs: Not on file  Physical Activity: Not on file  Stress: Not on file  Social Connections: Not on file   Past Surgical History:  Procedure Laterality Date   APPENDECTOMY  ~ 06/2007   BLADDER REPAIR  ~ 06/2007   "same day after bladder lift" (01/24/2013)   BLADDER SUSPENSION  ~ 06/2007   BREAST BIOPSY Left    DIAGNOSTIC LAPAROSCOPY  1990's & ~ 2000   "I've had a couple; for endometrosis" (01/24/2013)   HERNIA REPAIR     INCISIONAL HERNIA REPAIR N/A 01/24/2013   Procedure: LAPAROSCOPIC INCISIONAL HERNIA;  Surgeon: Shelly Rubenstein, MD;  Location: [facility] OR;  Service: General;  Laterality: N/A;  INSERTION OF MESH N/A 01/24/2013   Procedure: INSERTION OF MESH;  Surgeon: Shelly Rubenstein, MD;  Location: [facility] OR;  Service: General;  Laterality: N/A;   LAPAROSCOPIC INCISIONAL / UMBILICAL / VENTRAL HERNIA REPAIR  01/24/2013   IHR w/mesh/notes   NASAL SEPTUM SURGERY  1980's?   OOPHORECTOMY Right 2009   TONSILLECTOMY  1990's   VAGINAL HYSTERECTOMY  ` 06/2007   Past Surgical History:  Procedure Laterality Date   APPENDECTOMY  ~ 06/2007   BLADDER REPAIR  ~ 06/2007   "same day after bladder lift" (01/24/2013)   BLADDER SUSPENSION  ~ 06/2007   BREAST BIOPSY Left    DIAGNOSTIC LAPAROSCOPY  1990's & ~ 2000   "I've had a couple; for endometrosis" (01/24/2013)   HERNIA REPAIR     INCISIONAL HERNIA REPAIR N/A 01/24/2013   Procedure: LAPAROSCOPIC INCISIONAL HERNIA;  Surgeon: Shelly Rubenstein, MD;  Location: [facility] OR;  Service: General;  Laterality: N/A;   INSERTION OF MESH N/A 01/24/2013   Procedure: INSERTION OF MESH;  Surgeon: Shelly Rubenstein, MD;  Location: [facility];  Service: General;  Laterality: N/A;   LAPAROSCOPIC INCISIONAL / UMBILICAL / VENTRAL HERNIA REPAIR  01/24/2013   IHR w/mesh/notes   NASAL SEPTUM SURGERY  1980's?   OOPHORECTOMY Right 2009   TONSILLECTOMY  1990's   VAGINAL HYSTERECTOMY  ` 06/2007   Past Medical History:  Diagnosis Date   Allergy    Anxiety    Arthritis    "just the norm" (01/24/2013)   ASCVD (arteriosclerotic cardiovascular disease)    Asthma    Celiac disease    Daily headache    "depends on the season" (01/24/2013)   Diabetes mellitus without complication (HCC)    Fibromyalgia    GERD (gastroesophageal reflux disease)    History of colonic polyps    Hyperlipidemia    Hypertension    Migraines    Obesity (BMI 30.0-34.9)    RSD (reflex sympathetic dystrophy)    Sleep apnea    NO MACHINE RECOMMENDED   TIA (transient ischemic attack) ~ 2009   There were no vitals taken for this visit.  Opioid Risk Score:   Fall Risk Score:  `1  Depression screen PHQ 2/9     04/06/2023   12:03 PM 02/07/2023    1:16 PM 12/30/2022    1:27 PM 08/24/2022   12:52 PM 06/01/2022    1:10 PM 04/29/2022    1:49 PM 03/17/2022    1:27 PM  Depression screen PHQ 2/9  Decreased Interest 1 1 0 1 1 1 1   Down, Depressed, Hopeless 1 1 0 1 1 1 1   PHQ - 2 Score 2 2 0 2 2 2 2     Review of Systems  Musculoskeletal:        Left arm pain  All other systems reviewed and are negative.      Objective:   Physical Exam Vitals and nursing note reviewed.  Constitutional:      Appearance: Normal appearance.  Cardiovascular:     Rate and Rhythm: Normal rate and regular rhythm.     Pulses: Normal pulses.     Heart sounds: Normal heart sounds.  Pulmonary:     Effort: Pulmonary effort is normal.     Breath sounds: Normal breath sounds.  Musculoskeletal:      Cervical back: Normal range of motion and neck supple.     Comments: Normal Muscle Bulk and Muscle Testing Reveals:  Upper Extremities: Right : Full ROM  and Muscle Strength 5/5 Left Upper Extremity: Decreased ROM 90 Degrees and Muscle Strength 3/5 Bilateral AC Joint Tenderness   Lumbar Paraspinal Tenderness: L-4-L-5 Lower Extremities: Decreased ROM and Muscle Strength 4/4 Bilateral Lower Extremities Flexion Produces Pain into her bilateral lower extremity and Bilateral Feet Arises from Table slowly Antalgic  Gait     Skin:    General: Skin is warm and dry.  Neurological:     Mental Status: She is alert and oriented to person, place, and time.  Psychiatric:        Mood and Affect: Mood normal.        Behavior: Behavior normal.         Assessment & Plan:  1.Complex Regional Pain Syndrome Type 1: Continue Topamax and Cymbalta. 06/08/2023. Refilled: Hydro-codone 5/325mg  two tablets twice a day as needed for pain #60. Continue Tramadol 100 mg ER daily #30 and Tramadol 50 mg one tablet by mouth BID. One tablet in the afternoon and one tablet in the evening. We will continue the opioid monitoring program, this consists of regular clinic visits, examinations, urine drug screen, pill counts as well as use of [place] Controlled Substance Reporting system. A 12 month History has been reviewed on the [place] Controlled Substance Reporting System  On 06/08/2023. 2. Depression: Continue current medication regimen with Cymbalta.06/08/2023 3. Insomnia: Continue current medication regimen with  Trazodone one and half tablet at bedtime  06/08/2023 4. Muscle Spasms: Continue current medication regimen with Tizanidine. 06/08/2023 5. Chronic Pain Syndrome: Continue Ibuprofen BID as needed. 06/08/2023   F/U in 1 month

## 2023-06-08 ENCOUNTER — Encounter: Payer: Worker's Compensation | Attending: Registered Nurse | Admitting: Registered Nurse

## 2023-06-08 ENCOUNTER — Encounter: Payer: Self-pay | Admitting: Registered Nurse

## 2023-06-08 VITALS — BP 114/75 | HR 99 | Ht 67.0 in | Wt 181.0 lb

## 2023-06-08 DIAGNOSIS — G90512 Complex regional pain syndrome I of left upper limb: Secondary | ICD-10-CM | POA: Insufficient documentation

## 2023-06-08 DIAGNOSIS — Z79891 Long term (current) use of opiate analgesic: Secondary | ICD-10-CM | POA: Insufficient documentation

## 2023-06-08 DIAGNOSIS — Z5181 Encounter for therapeutic drug level monitoring: Secondary | ICD-10-CM | POA: Insufficient documentation

## 2023-06-08 DIAGNOSIS — M62838 Other muscle spasm: Secondary | ICD-10-CM | POA: Diagnosis present

## 2023-06-08 DIAGNOSIS — G4700 Insomnia, unspecified: Secondary | ICD-10-CM | POA: Insufficient documentation

## 2023-06-08 DIAGNOSIS — G894 Chronic pain syndrome: Secondary | ICD-10-CM | POA: Insufficient documentation

## 2023-06-08 DIAGNOSIS — F3289 Other specified depressive episodes: Secondary | ICD-10-CM | POA: Insufficient documentation

## 2023-06-08 MED ORDER — HYDROCODONE-ACETAMINOPHEN 5-325 MG PO TABS: 2.0000 | ORAL_TABLET | Freq: Two times a day (BID) | ORAL | 0 refills | Status: DC | PRN

## 2023-06-23 ENCOUNTER — Ambulatory Visit (INDEPENDENT_AMBULATORY_CARE_PROVIDER_SITE_OTHER): Payer: Medicare HMO | Admitting: Family Medicine

## 2023-06-23 ENCOUNTER — Encounter: Payer: Self-pay | Admitting: Family Medicine

## 2023-06-23 VITALS — BP 112/70 | HR 93 | Temp 98.3°F | Ht 67.0 in | Wt 180.0 lb

## 2023-06-23 DIAGNOSIS — B370 Candidal stomatitis: Secondary | ICD-10-CM | POA: Insufficient documentation

## 2023-06-23 DIAGNOSIS — R52 Pain, unspecified: Secondary | ICD-10-CM

## 2023-06-23 DIAGNOSIS — J069 Acute upper respiratory infection, unspecified: Secondary | ICD-10-CM | POA: Diagnosis not present

## 2023-06-23 LAB — POC COVID19 BINAXNOW: SARS Coronavirus 2 Ag: NEGATIVE

## 2023-06-23 MED ORDER — NYSTATIN 100000 UNIT/ML MT SUSP: 5.0000 mL | Freq: Four times a day (QID) | OROMUCOSAL | 0 refills | Status: DC

## 2023-06-23 NOTE — Patient Instructions (Addendum)
Rest, fluids. Start nystatin swish 4 times daily for the next 1 to 2 weeks, continue for 24 to 48 hours after symptoms resolved. Rinse mouth after Symbicort use. Can use Tylenol Extra Strength  (acetaminophen ) twice daily as needed for pain as long as acetaminophen dose with this and narcotic pain medicine does not exceed 4000 mg daily. Call if symptoms or not improving as expected.

## 2023-06-23 NOTE — Assessment & Plan Note (Signed)
Acute, COVID test in office today.  Likely symptoms due to viral infection.  No clear sign of bacterial infection.  Lung exam clear without suggestion of asthma exacerbation. Rest, fluids and symptomatic care.  Return and ER precautions provided

## 2023-06-23 NOTE — Progress Notes (Signed)
Patient ID: [name], female    DOB: [birth year], 57 y.o.   MRN: [id number]  This visit was conducted in person.  BP 112/70 (BP Location: Right Arm, Patient Position: Sitting, Cuff Size: Large)   Pulse 93   Temp 98.3 F (36.8 C) (Temporal)   Ht 5\' 7"  (1.702 m)   Wt 180 lb (81.6 kg)   SpO2 96%   BMI 28.19 kg/m    CC:  Chief Complaint  Patient presents with   Neck Pain    2 Crowns done last Wednesday.  Has been feeling bad since then   Sore Throat   Generalized Body Aches   Headache    Subjective:   HPI: [name] is a 57 y.o. female presenting on 06/23/2023 for Neck Pain (2 Crowns done last Wednesday.  Has been feeling bad since then), Sore Throat, Generalized Body Aches, and Headache    Date of onset:  1 week Initial symptoms included  headache, ST  Teeth hurting, tongue burning  Some sinus pain and pressure in ears.   No fever, sweating more. Symptoms progressed to joint pain, body pain  Some white on tongue.  Some SOB, chest heaviness    Sick contacts:  none.. did have dental work  Sister with symptoms and was recently travelling from [facility] COVID testing:   none     She has tried to treat with  albuterol inhaler prn.. more often 1-2 times daily.   Ibuprofen    Moderate intermittent asthma.. uses albuterol, on symbicort.  DM history  Non-smoker.       Relevant past medical, surgical, family and social history reviewed and updated as indicated. Interim medical history since our last visit reviewed. Allergies and medications reviewed and updated. Outpatient Medications Prior to Visit  Medication Sig Dispense Refill   Accu-Chek Softclix Lancets lancets Use as instructed 100 each 5   albuterol (VENTOLIN HFA) 108 (90 Base) MCG/ACT inhaler Inhale 2 puffs into the lungs every 6 (six) hours as needed for wheezing or shortness of breath. 18 g 1   budesonide-formoterol (SYMBICORT) 160-4.5 MCG/ACT inhaler INHALE 2 PUFFS BY MOUTH INTO THE  LUNGS DAILY     DULoxetine (CYMBALTA) 30 MG capsule Take 1 capsule (30 mg total) by mouth daily. 90 capsule 3   esomeprazole (NEXIUM) 40 MG capsule Take 40 mg by mouth daily.     fluticasone (FLONASE) 50 MCG/ACT nasal spray Place 2 sprays into both nostrils daily. 16 g 2   fluticasone-salmeterol (WIXELA INHUB) 250-50 MCG/ACT AEPB Inhale 1 puff into the lungs in the morning and at bedtime. 60 each 11   glucose blood (ACCU-CHEK GUIDE) test strip See admin instructions.     HYDROcodone-acetaminophen (NORCO/VICODIN) 5-325 MG tablet Take 2 tablets by mouth 2 (two) times daily as needed for moderate pain. 120 tablet 0   ibuprofen (ADVIL) 600 MG tablet TAKE 1 TABLET (600 MG TOTAL) BY MOUTH EVERY 8 (EIGHT) HOURS AS NEEDED FOR MILD PAIN. 90 tablet 5   Lancets MISC      loratadine (CLARITIN) 10 MG tablet Take 10 mg by mouth daily.     meclizine (ANTIVERT) 25 MG tablet Take 1 tablet (25 mg total) by mouth 3 (three) times daily. For vertigo 90 tablet 3   metFORMIN (GLUCOPHAGE-XR) 500 MG 24 hr tablet TAKE 2 TABLETS BY MOUTH EVERY DAY WITH BREAKFAST 180 tablet 3   metoCLOPramide (REGLAN) 10 MG tablet Take 10 mg by mouth daily.     montelukast (SINGULAIR) 10 MG  tablet Take 1 tablet by mouth at bedtime.     olmesartan (BENICAR) 40 MG tablet Take by mouth.     omeprazole (PRILOSEC) 40 MG capsule Take 40 mg by mouth 2 (two) times daily.     rosuvastatin (CRESTOR) 5 MG tablet TAKE 1 TABLET (5 MG TOTAL) BY MOUTH DAILY. 90 tablet 3   tiZANidine (ZANAFLEX) 4 MG tablet Take 1 tablet (4 mg total) by mouth every 6 (six) hours as needed for muscle spasms. 120 tablet 3   topiramate (TOPAMAX) 100 MG tablet TAKE 1 TABLET BY MOUTH EVERY MORNING AND TAKE 2 TABLETS BY MOUTH EVERY EVENING 270 tablet 1   traMADol (ULTRAM) 50 MG tablet TAKE 1 TABLET BY MOUTH TWICE A DAY. DIRECTION CHANGE. 60 tablet 2   traMADol (ULTRAM-ER) 100 MG 24 hr tablet Take 1 tablet (100 mg total) by mouth daily. 30 tablet 2   traZODone (DESYREL) 50 MG  tablet TAKE 1.5 TABLETS BY MOUTH AT BEDTIME 135 tablet 2   valACYclovir (VALTREX) 1000 MG tablet      No facility-administered medications prior to visit.     Per HPI unless specifically indicated in ROS section below Review of Systems  Constitutional:  Positive for fatigue. Negative for fever.  HENT:  Negative for congestion.   Eyes:  Negative for pain.  Respiratory:  Positive for cough. Negative for shortness of breath.   Cardiovascular:  Negative for chest pain, palpitations and leg swelling.  Gastrointestinal:  Negative for abdominal pain.  Genitourinary:  Negative for dysuria and vaginal bleeding.  Musculoskeletal:  Negative for back pain.  Neurological:  Positive for headaches. Negative for syncope and light-headedness.  Psychiatric/Behavioral:  Negative for dysphoric mood.    Objective:  BP 112/70 (BP Location: Right Arm, Patient Position: Sitting, Cuff Size: Large)   Pulse 93   Temp 98.3 F (36.8 C) (Temporal)   Ht 5\' 7"  (1.702 m)   Wt 180 lb (81.6 kg)   SpO2 96%   BMI 28.19 kg/m   Wt Readings from Last 3 Encounters:  06/23/23 180 lb (81.6 kg)  06/08/23 181 lb (82.1 kg)  05/12/23 181 lb (82.1 kg)      Physical Exam Constitutional:      General: She is not in acute distress.    Appearance: Normal appearance. She is well-developed. She is not ill-appearing or toxic-appearing.  HENT:     Head: Normocephalic.     Right Ear: Hearing, tympanic membrane, ear canal and external ear normal. Tympanic membrane is not erythematous, retracted or bulging.     Left Ear: Hearing, tympanic membrane, ear canal and external ear normal. Tympanic membrane is not erythematous, retracted or bulging.     Nose: No mucosal edema or rhinorrhea.     Right Sinus: No maxillary sinus tenderness or frontal sinus tenderness.     Left Sinus: No maxillary sinus tenderness or frontal sinus tenderness.     Mouth/Throat:     Mouth: Mucous membranes are normal.     Dentition: Dental caries present.      Tongue: Lesions present.     Pharynx: Uvula midline. Posterior oropharyngeal erythema present. No oropharyngeal exudate.     Tonsils: No tonsillar exudate or tonsillar abscesses.     Comments: White plaque on tongue and buccal mucosa Eyes:     General: Lids are normal. Lids are everted, no foreign bodies appreciated.     Extraocular Movements: EOM normal.     Conjunctiva/sclera: Conjunctivae normal.     Pupils: Pupils are  equal, round, and reactive to light.  Neck:     Thyroid: No thyroid mass or thyromegaly.     Vascular: No carotid bruit.     Trachea: Trachea normal.  Cardiovascular:     Rate and Rhythm: Normal rate and regular rhythm.     Pulses: Normal pulses.     Heart sounds: Normal heart sounds, S1 normal and S2 normal. No murmur heard.    No friction rub. No gallop.  Pulmonary:     Effort: Pulmonary effort is normal. No tachypnea or respiratory distress.     Breath sounds: Normal breath sounds. No decreased breath sounds, wheezing, rhonchi or rales.  Abdominal:     General: Bowel sounds are normal.     Palpations: Abdomen is soft.     Tenderness: There is no abdominal tenderness.  Musculoskeletal:     Cervical back: Normal range of motion and neck supple.  Skin:    General: Skin is warm, dry and intact.     Findings: No rash.  Neurological:     Mental Status: She is alert.  Psychiatric:        Mood and Affect: Mood is not anxious or depressed.        Speech: Speech normal.        Behavior: Behavior normal. Behavior is cooperative.        Thought Content: Thought content normal.        Cognition and Memory: Cognition and memory normal.        Judgment: Judgment normal.       Results for orders placed or performed in visit on 06/23/23  POC COVID-19  Result Value Ref Range   SARS Coronavirus 2 Ag Negative Negative    Assessment and Plan  Body aches -     POC COVID-19 BinaxNow  Oral thrush Assessment & Plan: Acute, this is most likely causing oral  symptoms and some of her additional symptoms. Likely secondary to not rinsing after Symbicort use. Complete course of nystatin 4 times daily swish and swallow.  Continue for 48 hours after symptoms resolved.   Viral URI Assessment & Plan: Acute, COVID test in office today.  Likely symptoms due to viral infection.  No clear sign of bacterial infection.  Lung exam clear without suggestion of asthma exacerbation. Rest, fluids and symptomatic care.  Return and ER precautions provided   Other orders -     Nystatin; Take 5 mLs (500,000 Units total) by mouth 4 (four) times daily.  Dispense: 60 mL; Refill: 0    No follow-ups on file.   Kerby Nora, MD

## 2023-06-23 NOTE — Assessment & Plan Note (Signed)
Acute, this is most likely causing oral symptoms and some of her additional symptoms. Likely secondary to not rinsing after Symbicort use. Complete course of nystatin 4 times daily swish and swallow.  Continue for 48 hours after symptoms resolved.

## 2023-06-27 ENCOUNTER — Encounter: Payer: Self-pay | Admitting: Internal Medicine

## 2023-06-27 ENCOUNTER — Ambulatory Visit (INDEPENDENT_AMBULATORY_CARE_PROVIDER_SITE_OTHER): Payer: Medicare HMO | Admitting: Internal Medicine

## 2023-06-27 VITALS — BP 124/74 | HR 97 | Temp 97.8°F | Ht 67.25 in | Wt 178.0 lb

## 2023-06-27 DIAGNOSIS — Z0000 Encounter for general adult medical examination without abnormal findings: Secondary | ICD-10-CM | POA: Diagnosis not present

## 2023-06-27 DIAGNOSIS — E119 Type 2 diabetes mellitus without complications: Secondary | ICD-10-CM | POA: Insufficient documentation

## 2023-06-27 DIAGNOSIS — E1159 Type 2 diabetes mellitus with other circulatory complications: Secondary | ICD-10-CM

## 2023-06-27 DIAGNOSIS — Z23 Encounter for immunization: Secondary | ICD-10-CM

## 2023-06-27 DIAGNOSIS — J0140 Acute pansinusitis, unspecified: Secondary | ICD-10-CM | POA: Diagnosis not present

## 2023-06-27 DIAGNOSIS — F39 Unspecified mood [affective] disorder: Secondary | ICD-10-CM

## 2023-06-27 DIAGNOSIS — Z7984 Long term (current) use of oral hypoglycemic drugs: Secondary | ICD-10-CM

## 2023-06-27 DIAGNOSIS — F1120 Opioid dependence, uncomplicated: Secondary | ICD-10-CM | POA: Diagnosis not present

## 2023-06-27 DIAGNOSIS — G90529 Complex regional pain syndrome I of unspecified lower limb: Secondary | ICD-10-CM | POA: Diagnosis not present

## 2023-06-27 DIAGNOSIS — J4520 Mild intermittent asthma, uncomplicated: Secondary | ICD-10-CM | POA: Diagnosis not present

## 2023-06-27 LAB — CBC
HCT: 42.9 % (ref 36.0–46.0)
Hemoglobin: 13.2 g/dL (ref 12.0–15.0)
MCHC: 30.9 g/dL (ref 30.0–36.0)
MCV: 87.9 fl (ref 78.0–100.0)
Platelets: 299 10*3/uL (ref 150.0–400.0)
RBC: 4.88 Mil/uL (ref 3.87–5.11)
RDW: 14 % (ref 11.5–15.5)
WBC: 12.7 10*3/uL — ABNORMAL HIGH (ref 4.0–10.5)

## 2023-06-27 LAB — COMPREHENSIVE METABOLIC PANEL
ALT: 14 U/L (ref 0–35)
AST: 14 U/L (ref 0–37)
Albumin: 4.4 g/dL (ref 3.5–5.2)
Alkaline Phosphatase: 98 U/L (ref 39–117)
BUN: 20 mg/dL (ref 6–23)
CO2: 26 meq/L (ref 19–32)
Calcium: 9.3 mg/dL (ref 8.4–10.5)
Chloride: 104 meq/L (ref 96–112)
Creatinine, Ser: 0.99 mg/dL (ref 0.40–1.20)
GFR: 62.52 mL/min (ref >60.00)
Glucose, Bld: 160 mg/dL — ABNORMAL HIGH (ref 70–99)
Potassium: 4.2 meq/L (ref 3.5–5.1)
Sodium: 139 meq/L (ref 135–145)
Total Bilirubin: 0.4 mg/dL (ref 0.2–1.2)
Total Protein: 6.9 g/dL (ref 6.0–8.3)

## 2023-06-27 LAB — LIPID PANEL
Cholesterol: 196 mg/dL (ref 0–200)
HDL: 40.5 mg/dL (ref >39.00)
Total CHOL/HDL Ratio: 5
Triglycerides: 446 mg/dL — ABNORMAL HIGH (ref 0.0–149.0)

## 2023-06-27 LAB — VITAMIN B12: Vitamin B-12: 183 pg/mL — ABNORMAL LOW (ref 211–911)

## 2023-06-27 LAB — TSH: TSH: 1.87 u[IU]/mL (ref 0.35–5.50)

## 2023-06-27 LAB — HEMOGLOBIN A1C: Hgb A1c MFr Bld: 8.4 % — ABNORMAL HIGH (ref 4.6–6.5)

## 2023-06-27 LAB — LDL CHOLESTEROL, DIRECT: Direct LDL: 116 mg/dL

## 2023-06-27 MED ORDER — AMOXICILLIN-POT CLAVULANATE 875-125 MG PO TABS: 1.0000 | ORAL_TABLET | Freq: Two times a day (BID) | ORAL | 0 refills | Status: DC

## 2023-06-27 MED ORDER — FLUCONAZOLE 150 MG PO TABS: 150.0000 mg | ORAL_TABLET | ORAL | 1 refills | Status: DC

## 2023-06-27 MED ORDER — BUDESONIDE-FORMOTEROL FUMARATE 160-4.5 MCG/ACT IN AERO: 2.0000 | INHALATION_SPRAY | Freq: Every day | RESPIRATORY_TRACT | 12 refills | Status: DC

## 2023-06-27 NOTE — Assessment & Plan Note (Signed)
Monitored by her physiatrist

## 2023-06-27 NOTE — Patient Instructions (Signed)
Please schedule the mammogram and colonoscopy.

## 2023-06-27 NOTE — Assessment & Plan Note (Signed)
Hopefully still good control On metformin 1000mg  daily Has neuropathy also--duloxetine

## 2023-06-27 NOTE — Assessment & Plan Note (Signed)
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

## 2023-06-27 NOTE — Assessment & Plan Note (Signed)
Changed to wixela for some reason Will switch back to symicort which she likes better Consider pulmonary if ongoing problems

## 2023-06-27 NOTE — Progress Notes (Signed)
Subjective:    Patient ID: [name], female    DOB: [birth year], 57 y.o.   MRN: [id number]  HPI Here for Medicare wellness visit and follow up of chronic health conditions With husband Reviewed advanced directives Reviewed other doctors---Dr Thomas--physical medicine/pain, Dr Collier [place], Neighborhood dentist, Dr Encarnacion Chu, Dr Meroth--audiology No hospitalizations or surgery this year No alcohol or tobacco Tries to walk--very limited May have fallen once this year--no injury Vision is okay Hearing is not great ---aides recommended Memory seems to be okay  Has had headache, body aches, sore throat, teeth hurting and tongue burning No fever Now with cough and drainage Some SOB--with activity  Hasn't been checking sugar--needs new machine Eating healthy Weight stable Stable burning and tingling in feet  Continues with physiatrist Uses hydrocodone and tramadol. Also on cymbalta Control is marginal---husband does most of the shopping/housework, etc. Independent with ADLs---even knocked out just taking a shower  Breathing is harder now Goes back a while--thinks she did better with symbicort  Episodic depression---several days a week Relates to the pain Not anhedonic  Some chest heaviness---vague. Even just at rest Some wheezing Heart skips at times--- no racing Edema in ankles and feet---usually gone in AM No regular headaches now--better lately  Still with acid reflux Mostly controlled with the bid esomeprazole Occasional dysphagia---food (rare choking)  Current Outpatient Medications on File Prior to Visit  Medication Sig Dispense Refill   Accu-Chek Softclix Lancets lancets Use as instructed 100 each 5   albuterol (VENTOLIN HFA) 108 (90 Base) MCG/ACT inhaler Inhale 2 puffs into the lungs every 6 (six) hours as needed for wheezing or shortness of breath. 18 g 1   budesonide-formoterol (SYMBICORT) 160-4.5 MCG/ACT inhaler INHALE 2 PUFFS BY MOUTH  INTO THE LUNGS DAILY     DULoxetine (CYMBALTA) 30 MG capsule Take 1 capsule (30 mg total) by mouth daily. 90 capsule 3   esomeprazole (NEXIUM) 40 MG capsule Take 40 mg by mouth daily.     fluticasone (FLONASE) 50 MCG/ACT nasal spray Place 2 sprays into both nostrils daily. 16 g 2   fluticasone-salmeterol (WIXELA INHUB) 250-50 MCG/ACT AEPB Inhale 1 puff into the lungs in the morning and at bedtime. 60 each 11   glucose blood (ACCU-CHEK GUIDE) test strip See admin instructions.     HYDROcodone-acetaminophen (NORCO/VICODIN) 5-325 MG tablet Take 2 tablets by mouth 2 (two) times daily as needed for moderate pain. 120 tablet 0   ibuprofen (ADVIL) 600 MG tablet TAKE 1 TABLET (600 MG TOTAL) BY MOUTH EVERY 8 (EIGHT) HOURS AS NEEDED FOR MILD PAIN. 90 tablet 5   Lancets MISC      loratadine (CLARITIN) 10 MG tablet Take 10 mg by mouth daily.     meclizine (ANTIVERT) 25 MG tablet Take 1 tablet (25 mg total) by mouth 3 (three) times daily. For vertigo 90 tablet 3   metFORMIN (GLUCOPHAGE-XR) 500 MG 24 hr tablet TAKE 2 TABLETS BY MOUTH EVERY DAY WITH BREAKFAST 180 tablet 3   metoCLOPramide (REGLAN) 10 MG tablet Take 10 mg by mouth daily.     montelukast (SINGULAIR) 10 MG tablet Take 1 tablet by mouth at bedtime.     olmesartan (BENICAR) 40 MG tablet Take by mouth.     omeprazole (PRILOSEC) 40 MG capsule Take 40 mg by mouth 2 (two) times daily.     rosuvastatin (CRESTOR) 5 MG tablet TAKE 1 TABLET (5 MG TOTAL) BY MOUTH DAILY. 90 tablet 3   tiZANidine (ZANAFLEX) 4 MG tablet Take 1 tablet (4  mg total) by mouth every 6 (six) hours as needed for muscle spasms. 120 tablet 3   topiramate (TOPAMAX) 100 MG tablet TAKE 1 TABLET BY MOUTH EVERY MORNING AND TAKE 2 TABLETS BY MOUTH EVERY EVENING 270 tablet 1   traMADol (ULTRAM) 50 MG tablet TAKE 1 TABLET BY MOUTH TWICE A DAY. DIRECTION CHANGE. 60 tablet 2   traMADol (ULTRAM-ER) 100 MG 24 hr tablet Take 1 tablet (100 mg total) by mouth daily. 30 tablet 2   traZODone (DESYREL)  50 MG tablet TAKE 1.5 TABLETS BY MOUTH AT BEDTIME 135 tablet 2   valACYclovir (VALTREX) 1000 MG tablet      [DISCONTINUED] losartan (COZAAR) 100 MG tablet TAKE 1 TABLET BY MOUTH EVERY DAY 90 tablet 0   No current facility-administered medications on file prior to visit.    Allergies  Allergen Reactions   Meperidine Anaphylaxis, Itching and Swelling    REACTION: swelling   Cymbalta [Duloxetine Hcl] Hives    Generic makes her feel like she is crawling out of her skin   Diazepam Hives    REACTION: swelling   Other     Axe Cologne   Shellfish Allergy Hives    Past Medical History:  Diagnosis Date   Allergy    Anxiety    Arthritis    "just the norm" (01/24/2013)   ASCVD (arteriosclerotic cardiovascular disease)    Asthma    Celiac disease    Daily headache    "depends on the season" (01/24/2013)   Diabetes mellitus without complication (HCC)    Fibromyalgia    GERD (gastroesophageal reflux disease)    History of colonic polyps    Hyperlipidemia    Hypertension    Migraines    Obesity (BMI 30.0-34.9)    RSD (reflex sympathetic dystrophy)    Sleep apnea    NO MACHINE RECOMMENDED   TIA (transient ischemic attack) ~ 2009    Past Surgical History:  Procedure Laterality Date   APPENDECTOMY  ~ 06/2007   BLADDER REPAIR  ~ 06/2007   "same day after bladder lift" (01/24/2013)   BLADDER SUSPENSION  ~ 06/2007   BREAST BIOPSY Left    DIAGNOSTIC LAPAROSCOPY  1990's & ~ 2000   "I've had a couple; for endometrosis" (01/24/2013)   HERNIA REPAIR     INCISIONAL HERNIA REPAIR N/A 01/24/2013   Procedure: LAPAROSCOPIC INCISIONAL HERNIA;  Surgeon: Shelly Rubenstein, MD;  Location: [facility] OR;  Service: General;  Laterality: N/A;   INSERTION OF MESH N/A 01/24/2013   Procedure: INSERTION OF MESH;  Surgeon: Shelly Rubenstein, MD;  Location: [facility];  Service: General;  Laterality: N/A;   LAPAROSCOPIC INCISIONAL / UMBILICAL / VENTRAL HERNIA REPAIR  01/24/2013   IHR w/mesh/notes   NASAL SEPTUM SURGERY  1980's?    OOPHORECTOMY Right 2009   TONSILLECTOMY  1990's   VAGINAL HYSTERECTOMY  ` 06/2007    Family History  Problem Relation Age of Onset   Cancer Mother        lung   Hypertension Mother    Cancer Father        colon   Hypertension Sister    Cancer Sister    Cancer Brother    Hyperlipidemia Sister    Birth defects Maternal Grandmother        breast   Breast cancer Maternal Grandmother    Birth defects Paternal Grandmother        uterine, stomach, lung   Breast cancer Paternal Grandmother    Breast  cancer Paternal Aunt     Social History   Socioeconomic History   Marital status: Married    Spouse name: Not on file   Number of children: 2   Years of education: Not on file   Highest education level: Not on file  Occupational History   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  Tobacco Use   Smoking status: Never    Passive exposure: Current   Smokeless tobacco: Never  Vaping Use   Vaping status: Never Used  Substance and Sexual Activity   Alcohol use: Not Currently    Alcohol/week: 0.0 standard drinks of alcohol    Comment: 01/24/2013 "drink or 2 once or /twice/year, if that"   Drug use: No   Sexual activity: Not Currently  Other Topics Concern   Not on file  Social History Narrative   No living will   Would want husband as POA--then sister, [name]   Would accept resuscitation attempts   Probably would not want tube feeds if cognitively unaware   Social Determinants of Health   Financial Resource Strain: Not on file  Food Insecurity: Not on file  Transportation Needs: Not on file  Physical Activity: Not on file  Stress: Not on file  Social Connections: Not on file  Intimate Partner Violence: Not on file   Review of Systems Chronic sleep issues Wears seat belt Just replaced 2 crowns--then she got sick Some scattered joint pains Has red spot on abdomen to be checked--doesn't see derm Bowels are slow---no meds. (Chronic) Voids okay    Objective:   Physical  Exam Constitutional:      Appearance: Normal appearance.  HENT:     Mouth/Throat:     Pharynx: No oropharyngeal exudate or posterior oropharyngeal erythema.  Eyes:     Conjunctiva/sclera: Conjunctivae normal.     Pupils: Pupils are equal, round, and reactive to light.  Cardiovascular:     Rate and Rhythm: Normal rate and regular rhythm.     Pulses: Normal pulses.     Heart sounds: No murmur heard.    No gallop.  Pulmonary:     Effort: Pulmonary effort is normal.     Breath sounds: No wheezing or rales.     Comments: Decreased breath sounds but not tight Abdominal:     Palpations: Abdomen is soft.     Tenderness: There is no abdominal tenderness.  Musculoskeletal:     Cervical back: Neck supple.     Right lower leg: No edema.     Left lower leg: No edema.  Lymphadenopathy:     Cervical: No cervical adenopathy.  Skin:    Findings: No rash.  Neurological:     Mental Status: She is alert and oriented to person, place, and time.     Comments: Word naming--- 7--then froze Recall 2/3 decreased sensation in feet  Psychiatric:        Mood and Affect: Mood normal.        Behavior: Behavior normal.            Assessment & Plan:

## 2023-06-27 NOTE — Assessment & Plan Note (Signed)
Symptoms now suggestive of sinus infection Will give augmentin 875 bid x 7 days

## 2023-06-27 NOTE — Progress Notes (Signed)
Hearing Screening - Comments:: Passed whisper test Vision Screening - Comments:: April 2024  

## 2023-06-27 NOTE — Assessment & Plan Note (Signed)
Chronic pain 

## 2023-06-27 NOTE — Assessment & Plan Note (Signed)
I have personally reviewed the Medicare Annual Wellness questionnaire and have noted 1. The patient's medical and social history 2. Their use of alcohol, tobacco or illicit drugs 3. Their current medications and supplements 4. The patient's functional ability including ADL's, fall risks, home safety risks and hearing or visual             impairment. 5. Diet and physical activities 6. Evidence for depression or mood disorders  The patients weight, height, BMI and visual acuity have been recorded in the chart I have made referrals, counseling and provided education to the patient based review of the above and I have provided the pt with a written personalized care plan for preventive services.  I have provided you with a copy of your personalized plan for preventive services. Please take the time to review along with your updated medication list.  Needs to set up mammogram and colonoscopy No pap due to hyster Limited in exercise Shingrix at pharmacy Flu vaccine today Prefers no COVID vaccine

## 2023-06-28 ENCOUNTER — Other Ambulatory Visit: Payer: Self-pay

## 2023-06-28 LAB — MICROALBUMIN / CREATININE URINE RATIO
Creatinine,U: 253.9 mg/dL
Microalb Creat Ratio: 2.1 mg/g (ref 0.0–30.0)
Microalb, Ur: 5.5 mg/dL — ABNORMAL HIGH (ref 0.0–1.9)

## 2023-06-28 MED ORDER — EMPAGLIFLOZIN 10 MG PO TABS: 10.0000 mg | ORAL_TABLET | Freq: Every day | ORAL | 11 refills | Status: DC

## 2023-07-08 ENCOUNTER — Encounter: Payer: Medicare HMO | Attending: Registered Nurse | Admitting: Registered Nurse

## 2023-07-08 ENCOUNTER — Ambulatory Visit
Admission: RE | Admit: 2023-07-08 | Discharge: 2023-07-08 | Disposition: A | Discharge status: Home / Self Care | Payer: Medicare HMO | Source: Ambulatory Visit | Attending: Registered Nurse | Admitting: Registered Nurse

## 2023-07-08 ENCOUNTER — Encounter: Payer: Self-pay | Admitting: Registered Nurse

## 2023-07-08 VITALS — BP 98/61 | HR 111 | Ht 67.25 in | Wt 174.2 lb

## 2023-07-08 DIAGNOSIS — G43009 Migraine without aura, not intractable, without status migrainosus: Secondary | ICD-10-CM | POA: Diagnosis not present

## 2023-07-08 DIAGNOSIS — G8929 Other chronic pain: Secondary | ICD-10-CM | POA: Insufficient documentation

## 2023-07-08 DIAGNOSIS — G4700 Insomnia, unspecified: Secondary | ICD-10-CM | POA: Diagnosis not present

## 2023-07-08 DIAGNOSIS — M5412 Radiculopathy, cervical region: Secondary | ICD-10-CM | POA: Insufficient documentation

## 2023-07-08 DIAGNOSIS — M25512 Pain in left shoulder: Secondary | ICD-10-CM | POA: Diagnosis not present

## 2023-07-08 DIAGNOSIS — Z5181 Encounter for therapeutic drug level monitoring: Secondary | ICD-10-CM | POA: Diagnosis not present

## 2023-07-08 DIAGNOSIS — M542 Cervicalgia: Secondary | ICD-10-CM | POA: Insufficient documentation

## 2023-07-08 DIAGNOSIS — M62838 Other muscle spasm: Secondary | ICD-10-CM | POA: Insufficient documentation

## 2023-07-08 DIAGNOSIS — G90512 Complex regional pain syndrome I of left upper limb: Secondary | ICD-10-CM | POA: Diagnosis not present

## 2023-07-08 DIAGNOSIS — F3289 Other specified depressive episodes: Secondary | ICD-10-CM | POA: Insufficient documentation

## 2023-07-08 DIAGNOSIS — G894 Chronic pain syndrome: Secondary | ICD-10-CM | POA: Insufficient documentation

## 2023-07-08 DIAGNOSIS — Z79891 Long term (current) use of opiate analgesic: Secondary | ICD-10-CM | POA: Insufficient documentation

## 2023-07-08 DIAGNOSIS — M25511 Pain in right shoulder: Secondary | ICD-10-CM | POA: Diagnosis not present

## 2023-07-08 MED ORDER — HYDROCODONE-ACETAMINOPHEN 5-325 MG PO TABS: 2.0000 | ORAL_TABLET | Freq: Two times a day (BID) | ORAL | 0 refills | Status: DC | PRN

## 2023-07-08 NOTE — Patient Instructions (Signed)
[facility]   [address] [place], [place], [place] [address] Phone: [phone]

## 2023-07-08 NOTE — Progress Notes (Signed)
Subjective:    Patient ID: [name], female    DOB: 1966-04-24, 57 y.o.   MRN: [id number]  HPI: [name] is a 57 y.o. female who returns for follow up appointment for chronic pain and medication refill. She states her pain is located in  her neck radiating into her bilateral shoulders since she had dental work three weeks ago, she also reports she has a migraine/ headache for three weeks since her dental procedure, she was seen by her PCP. She was encouraged to call her dentist as well and to go to the emergency room if no relief noted from Migraine, she verbalizes understanding. She was also instructed to keep a headache journal and she will call office with her decision to be seen by one of our providers for Migraines, she verbalizes understanding.    She also reports left upper extremity with tingling and burning. She rates her pain 8. Her current exercise regime is walking short distances, not following her usual exercise regimen due to Migraine.   [name] Morphine equivalent is 60.00 MME.  Last Oral Swab was Performed on 05/12/2023, it was consistent.     Pain Inventory Average Pain 7 Pain Right Now 8 My pain is constant, sharp, burning, stabbing, tingling, and aching  In the last 24 hours, has pain interfered with the following? General activity 2 Relation with others 2 Enjoyment of life 2 What TIME of day is your pain at its worst? morning , daytime, evening, and night Sleep (in general) Poor  Pain is worse with: walking, bending, standing, and some activites Pain improves with: rest, heat/ice, pacing activities, and medication Relief from Meds: 6  Family History  Problem Relation Age of Onset   Cancer Mother        lung   Hypertension Mother    Cancer Father        colon   Hypertension Sister    Cancer Sister    Cancer Brother    Hyperlipidemia Sister    Birth defects Maternal Grandmother        breast   Breast cancer Maternal  Grandmother    Birth defects Paternal Grandmother        uterine, stomach, lung   Breast cancer Paternal Grandmother    Breast cancer Paternal Aunt    Social History   Socioeconomic History   Marital status: Married    Spouse name: Not on file   Number of children: 2   Years of education: Not on file   Highest education level: Not on file  Occupational History   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  Tobacco Use   Smoking status: Never    Passive exposure: Current   Smokeless tobacco: Never  Vaping Use   Vaping status: Never Used  Substance and Sexual Activity   Alcohol use: Not Currently    Alcohol/week: 0.0 standard drinks of alcohol    Comment: 01/24/2013 "drink or 2 once or /twice/year, if that"   Drug use: No   Sexual activity: Not Currently  Other Topics Concern   Not on file  Social History Narrative   No living will   Would want husband as POA--then sister, [name]   Would accept resuscitation attempts   Probably would not want tube feeds if cognitively unaware   Social Determinants of Health   Financial Resource Strain: Not on file  Food Insecurity: Not on file  Transportation Needs: Not on file  Physical Activity: Not on file  Stress:  Not on file  Social Connections: Not on file   Past Surgical History:  Procedure Laterality Date   APPENDECTOMY  ~ 06/2007   BLADDER REPAIR  ~ 06/2007   "same day after bladder lift" (01/24/2013)   BLADDER SUSPENSION  ~ 06/2007   BREAST BIOPSY Left    DIAGNOSTIC LAPAROSCOPY  1990's & ~ 2000   "I've had a couple; for endometrosis" (01/24/2013)   HERNIA REPAIR     INCISIONAL HERNIA REPAIR N/A 01/24/2013   Procedure: LAPAROSCOPIC INCISIONAL HERNIA;  Surgeon: Shelly Rubenstein, MD;  Location: [facility] OR;  Service: General;  Laterality: N/A;   INSERTION OF MESH N/A 01/24/2013   Procedure: INSERTION OF MESH;  Surgeon: Shelly Rubenstein, MD;  Location: [facility] OR;  Service: General;  Laterality: N/A;   LAPAROSCOPIC INCISIONAL / UMBILICAL / VENTRAL  HERNIA REPAIR  01/24/2013   IHR w/mesh/notes   NASAL SEPTUM SURGERY  1980's?   OOPHORECTOMY Right 2009   TONSILLECTOMY  1990's   VAGINAL HYSTERECTOMY  ` 06/2007   Past Surgical History:  Procedure Laterality Date   APPENDECTOMY  ~ 06/2007   BLADDER REPAIR  ~ 06/2007   "same day after bladder lift" (01/24/2013)   BLADDER SUSPENSION  ~ 06/2007   BREAST BIOPSY Left    DIAGNOSTIC LAPAROSCOPY  1990's & ~ 2000   "I've had a couple; for endometrosis" (01/24/2013)   HERNIA REPAIR     INCISIONAL HERNIA REPAIR N/A 01/24/2013   Procedure: LAPAROSCOPIC INCISIONAL HERNIA;  Surgeon: Shelly Rubenstein, MD;  Location: [facility];  Service: General;  Laterality: N/A;   INSERTION OF MESH N/A 01/24/2013   Procedure: INSERTION OF MESH;  Surgeon: Shelly Rubenstein, MD;  Location: [facility];  Service: General;  Laterality: N/A;   LAPAROSCOPIC INCISIONAL / UMBILICAL / VENTRAL HERNIA REPAIR  01/24/2013   IHR w/mesh/notes   NASAL SEPTUM SURGERY  1980's?   OOPHORECTOMY Right 2009   TONSILLECTOMY  1990's   VAGINAL HYSTERECTOMY  ` 06/2007   Past Medical History:  Diagnosis Date   Allergy    Anxiety    Arthritis    "just the norm" (01/24/2013)   ASCVD (arteriosclerotic cardiovascular disease)    Asthma    Celiac disease    Daily headache    "depends on the season" (01/24/2013)   Diabetes mellitus without complication (HCC)    Fibromyalgia    GERD (gastroesophageal reflux disease)    History of colonic polyps    Hyperlipidemia    Hypertension    Migraines    Obesity (BMI 30.0-34.9)    RSD (reflex sympathetic dystrophy)    Sleep apnea    NO MACHINE RECOMMENDED   TIA (transient ischemic attack) ~ 2009   BP 98/61   Pulse (!) 109   Ht 5' 7.25" (1.708 m)   Wt 174 lb 3.2 oz (79 kg)   SpO2 97%   BMI 27.08 kg/m   Opioid Risk Score:   Fall Risk Score:  `1  Depression screen PHQ 2/9     07/08/2023    1:40 PM 06/27/2023   12:18 PM 06/27/2023   11:45 AM 06/08/2023    1:10 PM 06/08/2023    1:01 PM 04/06/2023   12:03 PM  02/07/2023    1:16 PM  Depression screen PHQ 2/9  Decreased Interest 0 0 1 1 0 1 1  Down, Depressed, Hopeless 0 1 1 1  0 1 1  PHQ - 2 Score 0 1 2 2  0 2 2  Altered sleeping  1      Tired, decreased energy   1      Change in appetite   0      Feeling bad or failure about yourself    2      Trouble concentrating   0      Moving slowly or fidgety/restless   1      Suicidal thoughts   0      PHQ-9 Score   7      Difficult doing work/chores   Very difficult          Review of Systems  Musculoskeletal:        Left arm pain   All other systems reviewed and are negative.     Objective:   Physical Exam        Assessment & Plan:  1.Cervicalgia/ Cervical Radiculitis: RX: Cervical X-ray. Continue to Monitor. 2.Complex Regional Pain Syndrome Type 1: Continue Topamax and Cymbalta. 07/08/2023. Refilled: Hydro-codone 5/325mg  two tablets twice a day as needed for pain #120. Continue Tramadol 100 mg ER daily #30 and Tramadol 50 mg one tablet by mouth BID. One tablet in the afternoon and one tablet in the evening. We will continue the opioid monitoring program, this consists of regular clinic visits, examinations, urine drug screen, pill counts as well as use of [place] Controlled Substance Reporting system. A 12 month History has been reviewed on the [place] Controlled Substance Reporting System  On 07/08/2023. 3. Depression:Depression with Physical Illness: She denies suicidal ideation or suicidal plan. She will call her insurance company to inquire if counseling is covered under her plan.  Continue current medication regimen with Cymbalta.07/08/2023 4. Insomnia: Continue current medication regimen with  Trazodone one and half tablet at bedtime  07/08/2023 5. Muscle Spasms: Continue current medication regimen with Tizanidine. 09/20 /2024 6. Chronic Pain Syndrome: Continue Ibuprofen BID as needed. 06/08/2023 7. Migraine without Aura: Continue Topamax: She was encouraged to go to ED  if no relief noted, she verbalizes understanding. We discussed keeping a headache journal and she will call our office if she would like to have one of our providers assess her for Migraines, she verbalizes understanding.  F/U in 1 month

## 2023-07-10 ENCOUNTER — Other Ambulatory Visit: Payer: Self-pay | Admitting: Internal Medicine

## 2023-07-11 ENCOUNTER — Other Ambulatory Visit: Payer: Self-pay | Admitting: Physical Medicine & Rehabilitation

## 2023-07-12 ENCOUNTER — Ambulatory Visit: Payer: Medicare HMO | Admitting: Registered Nurse

## 2023-07-14 ENCOUNTER — Other Ambulatory Visit: Payer: Self-pay | Admitting: Internal Medicine

## 2023-07-20 ENCOUNTER — Telehealth: Payer: Self-pay | Admitting: Registered Nurse

## 2023-07-20 NOTE — Telephone Encounter (Signed)
Patient called in and states her pharmacy is telling her that they don't have the script for hydrocodone . Patient needs assistance in obtaining her medication.

## 2023-07-20 NOTE — Telephone Encounter (Signed)
I have verified that [facility] does have the Rx and they will fill it. I have let Arisbeth know.

## 2023-07-28 ENCOUNTER — Ambulatory Visit: Payer: Medicare HMO | Admitting: Internal Medicine

## 2023-07-28 ENCOUNTER — Encounter: Payer: Self-pay | Admitting: Internal Medicine

## 2023-07-28 VITALS — BP 130/80 | HR 94 | Temp 98.5°F | Ht 67.25 in | Wt 174.0 lb

## 2023-07-28 DIAGNOSIS — G501 Atypical facial pain: Secondary | ICD-10-CM | POA: Insufficient documentation

## 2023-07-28 DIAGNOSIS — R519 Headache, unspecified: Secondary | ICD-10-CM | POA: Diagnosis not present

## 2023-07-28 MED ORDER — DOXYCYCLINE HYCLATE 100 MG PO TABS: 100.0000 mg | ORAL_TABLET | Freq: Two times a day (BID) | ORAL | 0 refills | Status: DC

## 2023-07-28 NOTE — Progress Notes (Signed)
Subjective:    Patient ID: [name], female    DOB: [birth year], 57 y.o.   MRN: [id number]  HPI Here due to ongoing respiratory illness  Took the antibiotic (augmentin) a month ago--didn't seem to help Pain in left frontal and maxillary and back of neck Pain in left jaw since being at dentist (2 caps replaced on that side)--this was now 6 weeks ago Went back to dentist---she shaved them down some  No fever Some coughing No sig post nasal drip--but purulent nasal discharge  Using ibuprofen and pain meds--not helping Pain is horrible  She wonders if this could be her CRPS--that is on her left side  Current Outpatient Medications on File Prior to Visit  Medication Sig Dispense Refill   Accu-Chek Softclix Lancets lancets Use as instructed 100 each 5   albuterol (VENTOLIN HFA) 108 (90 Base) MCG/ACT inhaler Inhale 2 puffs into the lungs every 6 (six) hours as needed for wheezing or shortness of breath. 18 g 1   budesonide-formoterol (SYMBICORT) 160-4.5 MCG/ACT inhaler Inhale 2 puffs into the lungs daily. 1 each 12   DULoxetine (CYMBALTA) 30 MG capsule Take 1 capsule (30 mg total) by mouth daily. 90 capsule 3   empagliflozin (JARDIANCE) 10 MG TABS tablet Take 1 tablet (10 mg total) by mouth daily before breakfast. 30 tablet 11   esomeprazole (NEXIUM) 40 MG capsule Take 40 mg by mouth daily.     fluconazole (DIFLUCAN) 150 MG tablet Take 1 tablet (150 mg total) by mouth once a week. 7 tablet 1   fluticasone (FLONASE) 50 MCG/ACT nasal spray Place 2 sprays into both nostrils daily. 16 g 2   glucose blood (ACCU-CHEK GUIDE) test strip See admin instructions.     HYDROcodone-acetaminophen (NORCO/VICODIN) 5-325 MG tablet Take 2 tablets by mouth 2 (two) times daily as needed for moderate pain. 120 tablet 0   ibuprofen (ADVIL) 600 MG tablet TAKE 1 TABLET (600 MG TOTAL) BY MOUTH EVERY 8 (EIGHT) HOURS AS NEEDED FOR MILD PAIN. 90 tablet 5   Lancets MISC      loratadine (CLARITIN) 10 MG  tablet Take 10 mg by mouth daily.     meclizine (ANTIVERT) 25 MG tablet Take 1 tablet (25 mg total) by mouth 3 (three) times daily. For vertigo 90 tablet 3   metFORMIN (GLUCOPHAGE-XR) 500 MG 24 hr tablet TAKE 2 TABLETS BY MOUTH EVERY DAY WITH BREAKFAST 180 tablet 3   metoCLOPramide (REGLAN) 10 MG tablet Take 10 mg by mouth daily.     montelukast (SINGULAIR) 10 MG tablet TAKE 1 TABLET BY MOUTH EVERYDAY AT BEDTIME 90 tablet 3   olmesartan (BENICAR) 40 MG tablet Take by mouth.     omeprazole (PRILOSEC) 40 MG capsule Take 40 mg by mouth 2 (two) times daily.     rosuvastatin (CRESTOR) 5 MG tablet TAKE 1 TABLET (5 MG TOTAL) BY MOUTH DAILY. 90 tablet 3   tiZANidine (ZANAFLEX) 4 MG tablet Take 1 tablet (4 mg total) by mouth every 6 (six) hours as needed for muscle spasms. 120 tablet 3   topiramate (TOPAMAX) 100 MG tablet TAKE 1 TABLET BY MOUTH EVERY MORNING AND TAKE 2 TABLETS BY MOUTH EVERY EVENING 270 tablet 1   traMADol (ULTRAM) 50 MG tablet TAKE 1 TABLET BY MOUTH TWICE A DAY. DIRECTION CHANGE. 60 tablet 2   traMADol (ULTRAM-ER) 100 MG 24 hr tablet Take 1 tablet (100 mg total) by mouth daily. 30 tablet 2   traZODone (DESYREL) 50 MG tablet TAKE 1 AND  1/2 TABLETS BY MOUTH AT BEDTIME 135 tablet 3   valACYclovir (VALTREX) 1000 MG tablet      [DISCONTINUED] losartan (COZAAR) 100 MG tablet TAKE 1 TABLET BY MOUTH EVERY DAY 90 tablet 0   No current facility-administered medications on file prior to visit.    Allergies  Allergen Reactions   Meperidine Anaphylaxis, Itching and Swelling    REACTION: swelling   Cymbalta [Duloxetine Hcl] Hives    Generic makes her feel like she is crawling out of her skin   Diazepam Hives    REACTION: swelling   Other     Axe Cologne   Shellfish Allergy Hives    Past Medical History:  Diagnosis Date   Allergy    Anxiety    Arthritis    "just the norm" (01/24/2013)   ASCVD (arteriosclerotic cardiovascular disease)    Asthma    Celiac disease    Daily headache     "depends on the season" (01/24/2013)   Diabetes mellitus without complication (HCC)    Fibromyalgia    GERD (gastroesophageal reflux disease)    History of colonic polyps    Hyperlipidemia    Hypertension    Migraines    Obesity (BMI 30.0-34.9)    RSD (reflex sympathetic dystrophy)    Sleep apnea    NO MACHINE RECOMMENDED   TIA (transient ischemic attack) ~ 2009    Past Surgical History:  Procedure Laterality Date   APPENDECTOMY  ~ 06/2007   BLADDER REPAIR  ~ 06/2007   "same day after bladder lift" (01/24/2013)   BLADDER SUSPENSION  ~ 06/2007   BREAST BIOPSY Left    DIAGNOSTIC LAPAROSCOPY  1990's & ~ 2000   "I've had a couple; for endometrosis" (01/24/2013)   HERNIA REPAIR     INCISIONAL HERNIA REPAIR N/A 01/24/2013   Procedure: LAPAROSCOPIC INCISIONAL HERNIA;  Surgeon: Shelly Rubenstein, MD;  Location: [facility] OR;  Service: General;  Laterality: N/A;   INSERTION OF MESH N/A 01/24/2013   Procedure: INSERTION OF MESH;  Surgeon: Shelly Rubenstein, MD;  Location: [facility];  Service: General;  Laterality: N/A;   LAPAROSCOPIC INCISIONAL / UMBILICAL / VENTRAL HERNIA REPAIR  01/24/2013   IHR w/mesh/notes   NASAL SEPTUM SURGERY  1980's?   OOPHORECTOMY Right 2009   TONSILLECTOMY  1990's   VAGINAL HYSTERECTOMY  ` 06/2007    Family History  Problem Relation Age of Onset   Cancer Mother        lung   Hypertension Mother    Cancer Father        colon   Hypertension Sister    Cancer Sister    Cancer Brother    Hyperlipidemia Sister    Birth defects Maternal Grandmother        breast   Breast cancer Maternal Grandmother    Birth defects Paternal Grandmother        uterine, stomach, lung   Breast cancer Paternal Grandmother    Breast cancer Paternal Aunt     Social History   Socioeconomic History   Marital status: Married    Spouse name: Not on file   Number of children: 2   Years of education: Not on file   Highest education level: Not on file  Occupational History   Occupation: Disabled  due to RSD  Tobacco Use   Smoking status: Never    Passive exposure: Current   Smokeless tobacco: Never  Vaping Use   Vaping status: Never Used  Substance and Sexual  Activity   Alcohol use: Not Currently    Alcohol/week: 0.0 standard drinks of alcohol    Comment: 01/24/2013 "drink or 2 once or /twice/year, if that"   Drug use: No   Sexual activity: Not Currently  Other Topics Concern   Not on file  Social History Narrative   No living will   Would want husband as POA--then sister, [name]   Would accept resuscitation attempts   Probably would not want tube feeds if cognitively unaware   Social Determinants of Health   Financial Resource Strain: Not on file  Food Insecurity: Not on file  Transportation Needs: Not on file  Physical Activity: Not on file  Stress: Not on file  Social Connections: Not on file  Intimate Partner Violence: Not on file   Review of Systems Sleeping some--not well Feels a lump in her throat at night Has to chew on her right side     Objective:   Physical Exam Constitutional:      Appearance: Normal appearance.  HENT:     Head:     Comments: Frontal >> maxillary tenderness Some left TMJ and jaw tenderness     Right Ear: Tympanic membrane and ear canal normal.     Ears:     Comments: ?fluid behind left TM--no inflammation    Mouth/Throat:     Pharynx: No oropharyngeal exudate or posterior oropharyngeal erythema.     Comments: No obvious teeth abnormalities Pulmonary:     Effort: Pulmonary effort is normal.     Breath sounds: Normal breath sounds. No wheezing or rales.  Musculoskeletal:     Cervical back: Neck supple.  Lymphadenopathy:     Cervical: No cervical adenopathy.  Neurological:     Mental Status: She is alert.            Assessment & Plan:

## 2023-07-28 NOTE — Assessment & Plan Note (Signed)
Severity makes me concerned about trigeminal neuralgia She has heard of CRPS going up to face--asked her to check with her pain doctor She also needs to be reevaluated by the dentist--to be sure it isn't related to those replaced crowns In case this could be sinus, will treat with doxy 100 bid x 2 weeks  If no improvement, will set up with neurologist

## 2023-08-02 ENCOUNTER — Other Ambulatory Visit: Payer: Self-pay | Admitting: Registered Nurse

## 2023-08-02 ENCOUNTER — Other Ambulatory Visit: Payer: Self-pay | Admitting: Internal Medicine

## 2023-08-09 NOTE — Progress Notes (Unsigned)
Subjective:    Patient ID: [name], female    DOB: 03/12/1966, 57 y.o.   MRN: [id number]  HPI: [name] is a 57 y.o. female who returns for follow up appointment for chronic pain and medication refill.  She reports left frontal nerve pain and left facial pain with tingling and and burning. She states her dentist Dr. Hilda Blades diagnose her with Trigeminal Neuralgia, we discuss treatment options, this will also be discussed with Dr Wynn Banker, referral placed to Neurology, she verbalizes understanding. She reports when she had  her dental work  after her appointment she began to experience left frontal and left facial tingling and burning pain. She also has an appointment with her PCP on 08/11/2023, she reports. She verbalizes understanding.  She also reports left arm pain with tingling and burning and bilateral feet with tingling and burning. She rates her pain 7. Her current exercise regime is walking short distances and performing stretching exercises.  [name] reports she was having some chest pressure that waxes and wanes, she didn't seek medical attention. She denies chest pain and chest pressure at this time. She refuses ED or Urgent Care evaluation at this time. She states if this occurs again she will seek medical attention.   [name] Morphine equivalent is 60.00 MME.   Oral swab was performed today.    Pain Inventory Average Pain 9 Pain Right Now 7 My pain is sharp, burning, stabbing, tingling, and aching  In the last 24 hours, has pain interfered with the following? General activity 5 Relation with others 4 Enjoyment of life 4 What TIME of day is your pain at its worst? morning , daytime, evening, and night Sleep (in general) Poor  Pain is worse with: walking, bending, sitting, inactivity, standing, unsure, and some activites Pain improves with: rest, heat/ice, pacing activities, and medication Relief from Meds: 6  Family  History  Problem Relation Age of Onset   Cancer Mother        lung   Hypertension Mother    Cancer Father        colon   Hypertension Sister    Cancer Sister    Cancer Brother    Hyperlipidemia Sister    Birth defects Maternal Grandmother        breast   Breast cancer Maternal Grandmother    Birth defects Paternal Grandmother        uterine, stomach, lung   Breast cancer Paternal Grandmother    Breast cancer Paternal Aunt    Social History   Socioeconomic History   Marital status: Married    Spouse name: Not on file   Number of children: 2   Years of education: Not on file   Highest education level: Not on file  Occupational History   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  Tobacco Use   Smoking status: Never    Passive exposure: Current   Smokeless tobacco: Never  Vaping Use   Vaping status: Never Used  Substance and Sexual Activity   Alcohol use: Not Currently    Alcohol/week: 0.0 standard drinks of alcohol    Comment: 01/24/2013 "drink or 2 once or /twice/year, if that"   Drug use: No   Sexual activity: Not Currently  Other Topics Concern   Not on file  Social History Narrative   No living will   Would want husband as POA--then sister, [name]   Would accept resuscitation attempts   Probably would not want tube feeds if cognitively unaware  Social Determinants of Health   Financial Resource Strain: Not on file  Food Insecurity: Not on file  Transportation Needs: Not on file  Physical Activity: Not on file  Stress: Not on file  Social Connections: Not on file   Past Surgical History:  Procedure Laterality Date   APPENDECTOMY  ~ 06/2007   BLADDER REPAIR  ~ 06/2007   "same day after bladder lift" (01/24/2013)   BLADDER SUSPENSION  ~ 06/2007   BREAST BIOPSY Left    DIAGNOSTIC LAPAROSCOPY  1990's & ~ 2000   "I've had a couple; for endometrosis" (01/24/2013)   HERNIA REPAIR     INCISIONAL HERNIA REPAIR N/A 01/24/2013   Procedure: LAPAROSCOPIC INCISIONAL HERNIA;   Surgeon: Shelly Rubenstein, MD;  Location: [facility];  Service: General;  Laterality: N/A;   INSERTION OF MESH N/A 01/24/2013   Procedure: INSERTION OF MESH;  Surgeon: Shelly Rubenstein, MD;  Location: [facility];  Service: General;  Laterality: N/A;   LAPAROSCOPIC INCISIONAL / UMBILICAL / VENTRAL HERNIA REPAIR  01/24/2013   IHR w/mesh/notes   NASAL SEPTUM SURGERY  1980's?   OOPHORECTOMY Right 2009   TONSILLECTOMY  1990's   VAGINAL HYSTERECTOMY  ` 06/2007   Past Surgical History:  Procedure Laterality Date   APPENDECTOMY  ~ 06/2007   BLADDER REPAIR  ~ 06/2007   "same day after bladder lift" (01/24/2013)   BLADDER SUSPENSION  ~ 06/2007   BREAST BIOPSY Left    DIAGNOSTIC LAPAROSCOPY  1990's & ~ 2000   "I've had a couple; for endometrosis" (01/24/2013)   HERNIA REPAIR     INCISIONAL HERNIA REPAIR N/A 01/24/2013   Procedure: LAPAROSCOPIC INCISIONAL HERNIA;  Surgeon: Shelly Rubenstein, MD;  Location: [facility];  Service: General;  Laterality: N/A;   INSERTION OF MESH N/A 01/24/2013   Procedure: INSERTION OF MESH;  Surgeon: Shelly Rubenstein, MD;  Location: [facility];  Service: General;  Laterality: N/A;   LAPAROSCOPIC INCISIONAL / UMBILICAL / VENTRAL HERNIA REPAIR  01/24/2013   IHR w/mesh/notes   NASAL SEPTUM SURGERY  1980's?   OOPHORECTOMY Right 2009   TONSILLECTOMY  1990's   VAGINAL HYSTERECTOMY  ` 06/2007   Past Medical History:  Diagnosis Date   Allergy    Anxiety    Arthritis    "just the norm" (01/24/2013)   ASCVD (arteriosclerotic cardiovascular disease)    Asthma    Celiac disease    Daily headache    "depends on the season" (01/24/2013)   Diabetes mellitus without complication (HCC)    Fibromyalgia    GERD (gastroesophageal reflux disease)    History of colonic polyps    Hyperlipidemia    Hypertension    Migraines    Obesity (BMI 30.0-34.9)    RSD (reflex sympathetic dystrophy)    Sleep apnea    NO MACHINE RECOMMENDED   TIA (transient ischemic attack) ~ 2009   There were no vitals taken for  this visit.  Opioid Risk Score:   Fall Risk Score:  `1  Depression screen PHQ 2/9     07/08/2023    1:40 PM 06/27/2023   12:18 PM 06/27/2023   11:45 AM 06/08/2023    1:10 PM 06/08/2023    1:01 PM 04/06/2023   12:03 PM 02/07/2023    1:16 PM  Depression screen PHQ 2/9  Decreased Interest 0 0 1 1 0 1 1  Down, Depressed, Hopeless 0 1 1 1  0 1 1  PHQ - 2 Score 0 1 2 2  0 2 2  Altered sleeping   1      Tired, decreased energy   1      Change in appetite   0      Feeling bad or failure about yourself    2      Trouble concentrating   0      Moving slowly or fidgety/restless   1      Suicidal thoughts   0      PHQ-9 Score   7      Difficult doing work/chores   Very difficult        Review of Systems  Musculoskeletal:        Right arm pain      Objective:   Physical Exam Vitals and nursing note reviewed.  Constitutional:      Appearance: Normal appearance.  Neck:     Comments: Cervical Paraspinal Tenderness: C-5-C-6 Cardiovascular:     Rate and Rhythm: Normal rate and regular rhythm.     Pulses: Normal pulses.     Heart sounds: Normal heart sounds.  Pulmonary:     Effort: Pulmonary effort is normal.     Breath sounds: Normal breath sounds.  Musculoskeletal:     Comments: Normal Muscle Bulk and Muscle Testing Reveals:  Upper Extremities: Right: Full ROM and Muscle Strength 5/5 Left Upper Extremity: Decreased ROM 90 Degrees and Muscle Strength 3/4 Bilateral AC Joint Tenderness  Thoracic and Lumbar Hypersensitivity Lower Extremities: Decreased  ROM and Muscle Strength 5/5 Bilateral Lower Extremity Flexion Produces Pain into her Bilateral Lower Extremities  and Bilateral Feet Arises from Table slowly Antalgic Gait     Skin:    General: Skin is warm and dry.  Neurological:     Mental Status: She is alert and oriented to person, place, and time.  Psychiatric:        Mood and Affect: Mood normal.        Behavior: Behavior normal.          Assessment & Plan:   1.Cervicalgia/ Cervical Radiculitis: No complaints today. X-ray resilts was reviewed. Continue to Monitor. 08/10/2023 2.Complex Regional Pain Syndrome Type 1: Continue Topamax and Cymbalta. 08/10/2023. Refilled: Hydro-codone 5/325mg  two tablets twice a day as needed for pain #120. Continue Tramadol 100 mg ER daily #30 and Tramadol 50 mg one tablet by mouth BID. One tablet in the afternoon and one tablet in the evening. We will continue the opioid monitoring program, this consists of regular clinic visits, examinations, urine drug screen, pill counts as well as use of [place] Controlled Substance Reporting system. A 12 month History has been reviewed on the [place] Controlled Substance Reporting System  On 08/10/2023. 3. Depression: with Physical Illness:  Continue current medication regimen with Cymbalta.08/10/2023 4. Insomnia: Continue current medication regimen with  Trazodone one and half tablet at bedtime  08/10/2023 5. Muscle Spasms: Continue current medication regimen with Tizanidine. 10/23 /2024 6. Chronic Pain Syndrome: Continue Ibuprofen BID as needed. 08/10/2023 7. Migraine without Aura: Continue Topamax: Continue to Monitor.  8. Dysphonia: She reports PCP will be placing a referral with ENT. She has the above complaints for the last two months. PCP Following. Continue to monitor.   9. Trigeminal Neuralgia: [name] reports her dentist Dr Hilda Blades diagnose with Trigeminal Neuralgia. Referral placed to Neurology. This will also  be discussed with Dr Wynn Banker . She verbalizes understanding.   F/U in 1 month

## 2023-08-10 ENCOUNTER — Ambulatory Visit: Payer: Medicare HMO | Admitting: Internal Medicine

## 2023-08-10 ENCOUNTER — Encounter: Payer: Self-pay | Admitting: Registered Nurse

## 2023-08-10 ENCOUNTER — Encounter: Payer: Worker's Compensation | Attending: Registered Nurse | Admitting: Registered Nurse

## 2023-08-10 VITALS — BP 133/83 | HR 97 | Ht 67.25 in | Wt 171.0 lb

## 2023-08-10 DIAGNOSIS — M62838 Other muscle spasm: Secondary | ICD-10-CM | POA: Diagnosis present

## 2023-08-10 DIAGNOSIS — G500 Trigeminal neuralgia: Secondary | ICD-10-CM | POA: Insufficient documentation

## 2023-08-10 DIAGNOSIS — F3289 Other specified depressive episodes: Secondary | ICD-10-CM | POA: Insufficient documentation

## 2023-08-10 DIAGNOSIS — G4700 Insomnia, unspecified: Secondary | ICD-10-CM | POA: Insufficient documentation

## 2023-08-10 DIAGNOSIS — Z79891 Long term (current) use of opiate analgesic: Secondary | ICD-10-CM | POA: Diagnosis present

## 2023-08-10 DIAGNOSIS — G90512 Complex regional pain syndrome I of left upper limb: Secondary | ICD-10-CM | POA: Diagnosis present

## 2023-08-10 DIAGNOSIS — R490 Dysphonia: Secondary | ICD-10-CM | POA: Diagnosis present

## 2023-08-10 DIAGNOSIS — Z5181 Encounter for therapeutic drug level monitoring: Secondary | ICD-10-CM | POA: Insufficient documentation

## 2023-08-10 DIAGNOSIS — G894 Chronic pain syndrome: Secondary | ICD-10-CM | POA: Insufficient documentation

## 2023-08-10 MED ORDER — HYDROCODONE-ACETAMINOPHEN 5-325 MG PO TABS: 2.0000 | ORAL_TABLET | Freq: Two times a day (BID) | ORAL | 0 refills | Status: DC | PRN

## 2023-08-10 NOTE — Patient Instructions (Signed)
[facility]:  [phone]

## 2023-08-11 ENCOUNTER — Telehealth: Payer: Self-pay | Admitting: Registered Nurse

## 2023-08-11 ENCOUNTER — Ambulatory Visit (INDEPENDENT_AMBULATORY_CARE_PROVIDER_SITE_OTHER): Payer: Medicare HMO | Admitting: Internal Medicine

## 2023-08-11 ENCOUNTER — Encounter: Payer: Self-pay | Admitting: Internal Medicine

## 2023-08-11 VITALS — BP 120/70 | HR 100 | Temp 98.9°F | Ht 67.25 in | Wt 169.0 lb

## 2023-08-11 DIAGNOSIS — F39 Unspecified mood [affective] disorder: Secondary | ICD-10-CM | POA: Diagnosis not present

## 2023-08-11 DIAGNOSIS — R519 Headache, unspecified: Secondary | ICD-10-CM | POA: Diagnosis not present

## 2023-08-11 DIAGNOSIS — R490 Dysphonia: Secondary | ICD-10-CM | POA: Insufficient documentation

## 2023-08-11 MED ORDER — CARBAMAZEPINE ER 100 MG PO TB12: 100.0000 mg | ORAL_TABLET | Freq: Two times a day (BID) | ORAL | 1 refills | Status: DC

## 2023-08-11 NOTE — Assessment & Plan Note (Signed)
Dentist and pain doctor also feel this could be trigeminal neuralgia Referral made to [facility] Will try empiric Rx for carbamazepine 100 bid pending the appt

## 2023-08-11 NOTE — Progress Notes (Signed)
Subjective:    Patient ID: [name], female    DOB: Feb 22, 1966, 57 y.o.   MRN: [id number]  HPI Here for follow up about facial pain  Did see dentist---thought it was most consistent with trigeminal neuralgia Did take the antibiotic---no help Saw pain doctor---nothing to add  Current Outpatient Medications on File Prior to Visit  Medication Sig Dispense Refill   Accu-Chek Softclix Lancets lancets Use as instructed 100 each 5   albuterol (VENTOLIN HFA) 108 (90 Base) MCG/ACT inhaler Inhale 2 puffs into the lungs every 6 (six) hours as needed for wheezing or shortness of breath. 18 g 1   budesonide-formoterol (SYMBICORT) 160-4.5 MCG/ACT inhaler Inhale 2 puffs into the lungs daily. 1 each 12   CYMBALTA 60 MG capsule Take 60 mg by mouth daily.     DULoxetine (CYMBALTA) 30 MG capsule Take 1 capsule (30 mg total) by mouth daily. 90 capsule 3   empagliflozin (JARDIANCE) 10 MG TABS tablet Take 1 tablet (10 mg total) by mouth daily before breakfast. 30 tablet 11   esomeprazole (NEXIUM) 40 MG capsule Take 40 mg by mouth daily.     fluconazole (DIFLUCAN) 150 MG tablet Take 1 tablet (150 mg total) by mouth once a week. 7 tablet 1   fluticasone (FLONASE) 50 MCG/ACT nasal spray Place 2 sprays into both nostrils daily. 16 g 2   glucose blood (ACCU-CHEK GUIDE) test strip See admin instructions.     HYDROcodone-acetaminophen (NORCO/VICODIN) 5-325 MG tablet Take 2 tablets by mouth 2 (two) times daily as needed for moderate pain (pain score 4-6). 120 tablet 0   ibuprofen (ADVIL) 600 MG tablet TAKE 1 TABLET (600 MG TOTAL) BY MOUTH EVERY 8 (EIGHT) HOURS AS NEEDED FOR MILD PAIN. 90 tablet 5   ibuprofen (ADVIL) 800 MG tablet Take 800 mg by mouth every 8 (eight) hours as needed.     Lancets MISC      loratadine (CLARITIN) 10 MG tablet Take 10 mg by mouth daily.     meclizine (ANTIVERT) 25 MG tablet Take 1 tablet (25 mg total) by mouth 3 (three) times daily. For vertigo 90 tablet 3   metFORMIN  (GLUCOPHAGE-XR) 500 MG 24 hr tablet TAKE 2 TABLETS BY MOUTH EVERY DAY WITH BREAKFAST 180 tablet 3   metoCLOPramide (REGLAN) 10 MG tablet Take 10 mg by mouth daily.     montelukast (SINGULAIR) 10 MG tablet TAKE 1 TABLET BY MOUTH EVERYDAY AT BEDTIME 90 tablet 3   olmesartan (BENICAR) 40 MG tablet TAKE 1 TABLET BY MOUTH EVERY DAY 90 tablet 3   omeprazole (PRILOSEC) 40 MG capsule Take 40 mg by mouth 2 (two) times daily.     rosuvastatin (CRESTOR) 5 MG tablet TAKE 1 TABLET (5 MG TOTAL) BY MOUTH DAILY. 90 tablet 3   tiZANidine (ZANAFLEX) 4 MG tablet Take 1 tablet (4 mg total) by mouth every 6 (six) hours as needed for muscle spasms. 120 tablet 3   topiramate (TOPAMAX) 100 MG tablet TAKE 1 TABLET BY MOUTH EVERY MORNING AND TAKE 2 TABLETS BY MOUTH EVERY EVENING 270 tablet 1   traMADol (ULTRAM) 50 MG tablet TAKE 1 TABLET BY MOUTH TWICE A DAY. DIRECTION CHANGE. 60 tablet 2   traMADol (ULTRAM-ER) 100 MG 24 hr tablet Take 1 tablet (100 mg total) by mouth daily. 30 tablet 2   traZODone (DESYREL) 50 MG tablet TAKE 1 AND 1/2 TABLETS BY MOUTH AT BEDTIME 135 tablet 3   valACYclovir (VALTREX) 1000 MG tablet      [  DISCONTINUED] losartan (COZAAR) 100 MG tablet TAKE 1 TABLET BY MOUTH EVERY DAY 90 tablet 0   No current facility-administered medications on file prior to visit.    Allergies  Allergen Reactions   Meperidine Anaphylaxis, Itching and Swelling    REACTION: swelling   Cymbalta [Duloxetine Hcl] Hives    Generic makes her feel like she is crawling out of her skin   Diazepam Hives    REACTION: swelling   Other     Axe Cologne   Shellfish Allergy Hives    Past Medical History:  Diagnosis Date   Allergy    Anxiety    Arthritis    "just the norm" (01/24/2013)   ASCVD (arteriosclerotic cardiovascular disease)    Asthma    Celiac disease    Daily headache    "depends on the season" (01/24/2013)   Diabetes mellitus without complication (HCC)    Fibromyalgia    GERD (gastroesophageal reflux disease)     History of colonic polyps    Hyperlipidemia    Hypertension    Migraines    Obesity (BMI 30.0-34.9)    RSD (reflex sympathetic dystrophy)    Sleep apnea    NO MACHINE RECOMMENDED   TIA (transient ischemic attack) ~ 2009    Past Surgical History:  Procedure Laterality Date   APPENDECTOMY  ~ 06/2007   BLADDER REPAIR  ~ 06/2007   "same day after bladder lift" (01/24/2013)   BLADDER SUSPENSION  ~ 06/2007   BREAST BIOPSY Left    DIAGNOSTIC LAPAROSCOPY  1990's & ~ 2000   "I've had a couple; for endometrosis" (01/24/2013)   HERNIA REPAIR     INCISIONAL HERNIA REPAIR N/A 01/24/2013   Procedure: LAPAROSCOPIC INCISIONAL HERNIA;  Surgeon: Shelly Rubenstein, MD;  Location: [facility] OR;  Service: General;  Laterality: N/A;   INSERTION OF MESH N/A 01/24/2013   Procedure: INSERTION OF MESH;  Surgeon: Shelly Rubenstein, MD;  Location: [facility];  Service: General;  Laterality: N/A;   LAPAROSCOPIC INCISIONAL / UMBILICAL / VENTRAL HERNIA REPAIR  01/24/2013   IHR w/mesh/notes   NASAL SEPTUM SURGERY  1980's?   OOPHORECTOMY Right 2009   TONSILLECTOMY  1990's   VAGINAL HYSTERECTOMY  ` 06/2007    Family History  Problem Relation Age of Onset   Cancer Mother        lung   Hypertension Mother    Cancer Father        colon   Hypertension Sister    Cancer Sister    Cancer Brother    Hyperlipidemia Sister    Birth defects Maternal Grandmother        breast   Breast cancer Maternal Grandmother    Birth defects Paternal Grandmother        uterine, stomach, lung   Breast cancer Paternal Grandmother    Breast cancer Paternal Aunt     Social History   Socioeconomic History   Marital status: Married    Spouse name: Not on file   Number of children: 2   Years of education: Not on file   Highest education level: Not on file  Occupational History   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  Tobacco Use   Smoking status: Never    Passive exposure: Current   Smokeless tobacco: Never  Vaping Use   Vaping status:  Never Used  Substance and Sexual Activity   Alcohol use: Not Currently    Alcohol/week: 0.0 standard drinks of alcohol    Comment: 01/24/2013 "  drink or 2 once or /twice/year, if that"   Drug use: No   Sexual activity: Not Currently  Other Topics Concern   Not on file  Social History Narrative   No living will   Would want husband as POA--then sister, [name]   Would accept resuscitation attempts   Probably would not want tube feeds if cognitively unaware   Social Determinants of Health   Financial Resource Strain: Not on file  Food Insecurity: Not on file  Transportation Needs: Not on file  Physical Activity: Not on file  Stress: Not on file  Social Connections: Not on file  Intimate Partner Violence: Not on file   Review of Systems Some anxiety --may be related to multiple issues Ongoing laryngitis--- feels like lump in throat. Still on PPI twice a day    Objective:   Physical Exam Constitutional:      Appearance: Normal appearance.     Comments: Mild hoarseness  Neurological:     Mental Status: She is alert.            Assessment & Plan:

## 2023-08-11 NOTE — Assessment & Plan Note (Signed)
More anxiety with ongoing pain If not improved with the new Rx--will need to consider alternatives

## 2023-08-11 NOTE — Telephone Encounter (Signed)
Spoke with Dr. Wynn Banker regarding [name] diagnosis of Trigeminal Neuralgia. Her PCP prescribed the Carbamazepine and she is waiting on Neurology appointment. She was instructed to call [facility] today, she verbalizes understanding.

## 2023-08-11 NOTE — Assessment & Plan Note (Signed)
Likely reflux related Will set up with ENT for direct laryngoscopy

## 2023-08-14 ENCOUNTER — Encounter: Payer: Self-pay | Admitting: Internal Medicine

## 2023-08-14 LAB — DRUG TOX MONITOR 1 W/CONF, ORAL FLD
Amphetamines: NEGATIVE ng/mL (ref <10)
Barbiturates: NEGATIVE ng/mL (ref <10)
Benzodiazepines: NEGATIVE ng/mL (ref <0.50)
Buprenorphine: NEGATIVE ng/mL (ref <0.10)
Cocaine: NEGATIVE ng/mL (ref <5.0)
Codeine: NEGATIVE ng/mL (ref <2.5)
Dihydrocodeine: 2.6 ng/mL — ABNORMAL HIGH (ref <2.5)
Fentanyl: NEGATIVE ng/mL (ref <0.10)
Heroin Metabolite: NEGATIVE ng/mL (ref <1.0)
Hydrocodone: 57.1 ng/mL — ABNORMAL HIGH (ref <2.5)
Hydromorphone: NEGATIVE ng/mL (ref <2.5)
MARIJUANA: NEGATIVE ng/mL (ref <2.5)
MDMA: NEGATIVE ng/mL (ref <10)
Meprobamate: NEGATIVE ng/mL (ref <2.5)
Methadone: NEGATIVE ng/mL (ref <5.0)
Morphine: NEGATIVE ng/mL (ref <2.5)
Nicotine Metabolite: NEGATIVE ng/mL (ref <5.0)
Norhydrocodone: 7.8 ng/mL — ABNORMAL HIGH (ref <2.5)
Noroxycodone: NEGATIVE ng/mL (ref <2.5)
Opiates: POSITIVE ng/mL — AB (ref <2.5)
Oxycodone: NEGATIVE ng/mL (ref <2.5)
Oxymorphone: NEGATIVE ng/mL (ref <2.5)
Phencyclidine: NEGATIVE ng/mL (ref <10)
Tapentadol: NEGATIVE ng/mL (ref <5.0)
Tramadol: >500 ng/mL — ABNORMAL HIGH (ref <5.0)
Tramadol: POSITIVE ng/mL — AB (ref <5.0)
Zolpidem: NEGATIVE ng/mL (ref <5.0)

## 2023-08-14 LAB — DRUG TOX ALC METAB W/CON, ORAL FLD: Alcohol Metabolite: NEGATIVE ng/mL (ref <25)

## 2023-08-15 MED ORDER — GABAPENTIN 100 MG PO CAPS
100.0000 mg | ORAL_CAPSULE | Freq: Three times a day (TID) | ORAL | 1 refills | Status: DC
Start: 2023-08-15 — End: 2023-08-29

## 2023-08-29 ENCOUNTER — Encounter: Payer: Self-pay | Admitting: Internal Medicine

## 2023-08-29 ENCOUNTER — Ambulatory Visit (INDEPENDENT_AMBULATORY_CARE_PROVIDER_SITE_OTHER): Payer: Medicare HMO | Admitting: Internal Medicine

## 2023-08-29 VITALS — BP 112/70 | HR 76 | Temp 98.5°F | Ht 67.25 in | Wt 173.0 lb

## 2023-08-29 DIAGNOSIS — E1159 Type 2 diabetes mellitus with other circulatory complications: Secondary | ICD-10-CM

## 2023-08-29 DIAGNOSIS — G501 Atypical facial pain: Secondary | ICD-10-CM

## 2023-08-29 DIAGNOSIS — G90529 Complex regional pain syndrome I of unspecified lower limb: Secondary | ICD-10-CM | POA: Diagnosis not present

## 2023-08-29 DIAGNOSIS — Z7984 Long term (current) use of oral hypoglycemic drugs: Secondary | ICD-10-CM

## 2023-08-29 LAB — POCT GLYCOSYLATED HEMOGLOBIN (HGB A1C): Hemoglobin A1C: 7 % — AB (ref 4.0–5.6)

## 2023-08-29 MED ORDER — GABAPENTIN 300 MG PO CAPS: 300.0000 mg | ORAL_CAPSULE | Freq: Two times a day (BID) | ORAL | 5 refills | Status: DC

## 2023-08-29 NOTE — Assessment & Plan Note (Signed)
Continues with pain specialist

## 2023-08-29 NOTE — Addendum Note (Signed)
Addended by: Eual Fines on: 08/29/2023 04:26 PM   Modules accepted: Orders

## 2023-08-29 NOTE — Progress Notes (Signed)
Subjective:    Patient ID: [name], female    DOB: Sep 13, 1966, 57 y.o.   MRN: [id number]  HPI Here for follow up of trigeminal neuralgia  No longer has earache Tingling on side of face is better Did push up the gabapentin to 300 tid quickly--- misunderstood my directions Still with head pain  Slight foggy feeling--but tolerable Fairly constant Over entire left side of head and face  Worse with brushing teeth and chewing, etc  Has worked on eating better and less calories in drinks  Current Outpatient Medications on File Prior to Visit  Medication Sig Dispense Refill   Accu-Chek Softclix Lancets lancets Use as instructed 100 each 5   albuterol (VENTOLIN HFA) 108 (90 Base) MCG/ACT inhaler Inhale 2 puffs into the lungs every 6 (six) hours as needed for wheezing or shortness of breath. 18 g 1   budesonide-formoterol (SYMBICORT) 160-4.5 MCG/ACT inhaler Inhale 2 puffs into the lungs daily. 1 each 12   CYMBALTA 60 MG capsule Take 60 mg by mouth daily.     DULoxetine (CYMBALTA) 30 MG capsule Take 1 capsule (30 mg total) by mouth daily. 90 capsule 3   empagliflozin (JARDIANCE) 10 MG TABS tablet Take 1 tablet (10 mg total) by mouth daily before breakfast. 30 tablet 11   esomeprazole (NEXIUM) 40 MG capsule Take 40 mg by mouth daily.     fluconazole (DIFLUCAN) 150 MG tablet Take 1 tablet (150 mg total) by mouth once a week. 7 tablet 1   gabapentin (NEURONTIN) 100 MG capsule Take 1-2 capsules (100-200 mg total) by mouth 3 (three) times daily. 180 capsule 1   glucose blood (ACCU-CHEK GUIDE) test strip See admin instructions.     HYDROcodone-acetaminophen (NORCO/VICODIN) 5-325 MG tablet Take 2 tablets by mouth 2 (two) times daily as needed for moderate pain (pain score 4-6). 120 tablet 0   ibuprofen (ADVIL) 600 MG tablet TAKE 1 TABLET (600 MG TOTAL) BY MOUTH EVERY 8 (EIGHT) HOURS AS NEEDED FOR MILD PAIN. 90 tablet 5   Lancets MISC      loratadine (CLARITIN) 10 MG tablet Take 10 mg  by mouth daily.     metFORMIN (GLUCOPHAGE-XR) 500 MG 24 hr tablet TAKE 2 TABLETS BY MOUTH EVERY DAY WITH BREAKFAST 180 tablet 3   montelukast (SINGULAIR) 10 MG tablet TAKE 1 TABLET BY MOUTH EVERYDAY AT BEDTIME 90 tablet 3   olmesartan (BENICAR) 40 MG tablet TAKE 1 TABLET BY MOUTH EVERY DAY 90 tablet 3   omeprazole (PRILOSEC) 40 MG capsule Take 40 mg by mouth 2 (two) times daily.     rosuvastatin (CRESTOR) 5 MG tablet TAKE 1 TABLET (5 MG TOTAL) BY MOUTH DAILY. 90 tablet 3   tiZANidine (ZANAFLEX) 4 MG tablet Take 1 tablet (4 mg total) by mouth every 6 (six) hours as needed for muscle spasms. 120 tablet 3   topiramate (TOPAMAX) 100 MG tablet TAKE 1 TABLET BY MOUTH EVERY MORNING AND TAKE 2 TABLETS BY MOUTH EVERY EVENING 270 tablet 1   traMADol (ULTRAM) 50 MG tablet TAKE 1 TABLET BY MOUTH TWICE A DAY. DIRECTION CHANGE. 60 tablet 2   traMADol (ULTRAM-ER) 100 MG 24 hr tablet Take 1 tablet (100 mg total) by mouth daily. 30 tablet 2   traZODone (DESYREL) 50 MG tablet TAKE 1 AND 1/2 TABLETS BY MOUTH AT BEDTIME 135 tablet 3   valACYclovir (VALTREX) 1000 MG tablet      [DISCONTINUED] losartan (COZAAR) 100 MG tablet TAKE 1 TABLET BY MOUTH EVERY DAY 90  tablet 0   No current facility-administered medications on file prior to visit.    Allergies  Allergen Reactions   Meperidine Anaphylaxis, Itching and Swelling    REACTION: swelling   Cymbalta [Duloxetine Hcl] Hives    Generic makes her feel like she is crawling out of her skin   Diazepam Hives    REACTION: swelling   Other     Axe Cologne   Shellfish Allergy Hives    Past Medical History:  Diagnosis Date   Allergy    Anxiety    Arthritis    "just the norm" (01/24/2013)   ASCVD (arteriosclerotic cardiovascular disease)    Asthma    Celiac disease    Daily headache    "depends on the season" (01/24/2013)   Diabetes mellitus without complication (HCC)    Fibromyalgia    GERD (gastroesophageal reflux disease)    History of colonic polyps     Hyperlipidemia    Hypertension    Migraines    Obesity (BMI 30.0-34.9)    RSD (reflex sympathetic dystrophy)    Sleep apnea    NO MACHINE RECOMMENDED   TIA (transient ischemic attack) ~ 2009    Past Surgical History:  Procedure Laterality Date   APPENDECTOMY  ~ 06/2007   BLADDER REPAIR  ~ 06/2007   "same day after bladder lift" (01/24/2013)   BLADDER SUSPENSION  ~ 06/2007   BREAST BIOPSY Left    DIAGNOSTIC LAPAROSCOPY  1990's & ~ 2000   "I've had a couple; for endometrosis" (01/24/2013)   HERNIA REPAIR     INCISIONAL HERNIA REPAIR N/A 01/24/2013   Procedure: LAPAROSCOPIC INCISIONAL HERNIA;  Surgeon: Shelly Rubenstein, MD;  Location: [facility];  Service: General;  Laterality: N/A;   INSERTION OF MESH N/A 01/24/2013   Procedure: INSERTION OF MESH;  Surgeon: Shelly Rubenstein, MD;  Location: [facility];  Service: General;  Laterality: N/A;   LAPAROSCOPIC INCISIONAL / UMBILICAL / VENTRAL HERNIA REPAIR  01/24/2013   IHR w/mesh/notes   NASAL SEPTUM SURGERY  1980's?   OOPHORECTOMY Right 2009   TONSILLECTOMY  1990's   VAGINAL HYSTERECTOMY  ` 06/2007    Family History  Problem Relation Age of Onset   Cancer Mother        lung   Hypertension Mother    Cancer Father        colon   Hypertension Sister    Cancer Sister    Cancer Brother    Hyperlipidemia Sister    Birth defects Maternal Grandmother        breast   Breast cancer Maternal Grandmother    Birth defects Paternal Grandmother        uterine, stomach, lung   Breast cancer Paternal Grandmother    Breast cancer Paternal Aunt     Social History   Socioeconomic History   Marital status: Married    Spouse name: Not on file   Number of children: 2   Years of education: Not on file   Highest education level: Not on file  Occupational History   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  Tobacco Use   Smoking status: Never    Passive exposure: Current   Smokeless tobacco: Never  Vaping Use   Vaping status: Never Used  Substance and Sexual  Activity   Alcohol use: Not Currently    Alcohol/week: 0.0 standard drinks of alcohol    Comment: 01/24/2013 "drink or 2 once or /twice/year, if that"   Drug use: No  Sexual activity: Not Currently  Other Topics Concern   Not on file  Social History Narrative   No living will   Would want husband as POA--then sister, [name]   Would accept resuscitation attempts   Probably would not want tube feeds if cognitively unaware   Social Determinants of Health   Financial Resource Strain: Not on file  Food Insecurity: Not on file  Transportation Needs: Not on file  Physical Activity: Not on file  Stress: Not on file  Social Connections: Not on file  Intimate Partner Violence: Not on file   Review of Systems Sleep is variable Facial pain and RSD can keep her up Needs new machine     Objective:   Physical Exam Constitutional:      Appearance: Normal appearance.     Comments: Looks more comfortable  Neurological:     Mental Status: She is alert.  Psychiatric:        Mood and Affect: Mood normal.        Behavior: Behavior normal.            Assessment & Plan:

## 2023-08-29 NOTE — Assessment & Plan Note (Signed)
Is working to eat and drink healthier On jardiance 10 daily, metformin 1000 daily  A1c much better at 7.0%!! No med changes needed

## 2023-08-29 NOTE — Assessment & Plan Note (Signed)
Seems more consistent with trigeminal neuropathy --then neuralgia Is slightly better with the gabapentin at 300 tid Will increase to 300/300/600 Awaiting neurology evaluation--may want to consider MRI and gamma knife Rx if symptoms persist

## 2023-09-12 ENCOUNTER — Encounter: Payer: Self-pay | Admitting: Internal Medicine

## 2023-09-13 ENCOUNTER — Encounter: Payer: Worker's Compensation | Attending: Registered Nurse | Admitting: Registered Nurse

## 2023-09-13 ENCOUNTER — Encounter: Payer: Self-pay | Admitting: Registered Nurse

## 2023-09-13 DIAGNOSIS — G90512 Complex regional pain syndrome I of left upper limb: Secondary | ICD-10-CM | POA: Insufficient documentation

## 2023-09-13 DIAGNOSIS — Z79891 Long term (current) use of opiate analgesic: Secondary | ICD-10-CM | POA: Insufficient documentation

## 2023-09-13 DIAGNOSIS — Z5181 Encounter for therapeutic drug level monitoring: Secondary | ICD-10-CM | POA: Diagnosis not present

## 2023-09-13 DIAGNOSIS — G894 Chronic pain syndrome: Secondary | ICD-10-CM | POA: Diagnosis not present

## 2023-09-13 DIAGNOSIS — G4700 Insomnia, unspecified: Secondary | ICD-10-CM | POA: Insufficient documentation

## 2023-09-13 DIAGNOSIS — F3289 Other specified depressive episodes: Secondary | ICD-10-CM | POA: Insufficient documentation

## 2023-09-13 MED ORDER — TRAMADOL HCL ER 100 MG PO TB24: 100.0000 mg | ORAL_TABLET | Freq: Every day | ORAL | 2 refills | Status: DC

## 2023-09-13 MED ORDER — HYDROCODONE-ACETAMINOPHEN 5-325 MG PO TABS: 2.0000 | ORAL_TABLET | Freq: Two times a day (BID) | ORAL | 0 refills | Status: DC | PRN

## 2023-09-13 MED ORDER — TRAMADOL HCL 50 MG PO TABS: ORAL_TABLET | ORAL | 2 refills | Status: DC

## 2023-09-13 MED ORDER — IBUPROFEN 600 MG PO TABS: 600.0000 mg | ORAL_TABLET | Freq: Three times a day (TID) | ORAL | 5 refills | Status: DC | PRN

## 2023-09-13 MED ORDER — TIZANIDINE HCL 4 MG PO TABS: 4.0000 mg | ORAL_TABLET | Freq: Four times a day (QID) | ORAL | 3 refills | Status: DC | PRN

## 2023-09-13 NOTE — Progress Notes (Signed)
Subjective:    Patient ID: [name], female    DOB: 25-Aug-1966, 57 y.o.   MRN: [id number]  HPI: [name] is a 57 y.o. female who is scheduled for My-Chart video visit, she called office this morning reporting she had a stomach bug.  Her visit was changed to a virtual visit. I connected with Ms. Tynley Hommes- Giarraputo by a video enabled telemedicine application and verified that I am speaking with the correct person using two identifiers.  Location: Patient: In her Home Provider: In the Office    I discussed the limitations of evaluation and management by telemedicine and the availability of in person appointments. The patient expressed understanding and agreed to proceed.  She states her  pain is located in her left arm and left wrist. She rates her pain 7. Her current exercise regime is walking short distances.   [name] Morphine equivalent is 60.00 MME.   Last Oral Swab was Performed on 08/10/2023, it was consistent.   Pain Inventory Average Pain 9 Pain Right Now 7 My pain is sharp, burning, stabbing, tingling, and aching  In the last 24 hours, has pain interfered with the following? General activity 5 Relation with others 4 Enjoyment of life 4 What TIME of day is your pain at its worst? morning , daytime, evening, and night Sleep (in general) Poor  Pain is worse with: walking, bending, sitting, inactivity, standing, unsure, and some activites Pain improves with: rest, heat/ice, therapy/exercise, and medication Relief from Meds: 6  Family History  Problem Relation Age of Onset   Cancer Mother        lung   Hypertension Mother    Cancer Father        colon   Hypertension Sister    Cancer Sister    Cancer Brother    Hyperlipidemia Sister    Birth defects Maternal Grandmother        breast   Breast cancer Maternal Grandmother    Birth defects Paternal Grandmother        uterine, stomach, lung   Breast cancer Paternal  Grandmother    Breast cancer Paternal Aunt    Social History   Socioeconomic History   Marital status: Married    Spouse name: Not on file   Number of children: 2   Years of education: Not on file   Highest education level: Not on file  Occupational History   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  Tobacco Use   Smoking status: Never    Passive exposure: Current   Smokeless tobacco: Never  Vaping Use   Vaping status: Never Used  Substance and Sexual Activity   Alcohol use: Not Currently    Alcohol/week: 0.0 standard drinks of alcohol    Comment: 01/24/2013 "drink or 2 once or /twice/year, if that"   Drug use: No   Sexual activity: Not Currently  Other Topics Concern   Not on file  Social History Narrative   No living will   Would want husband as POA--then sister, [name]   Would accept resuscitation attempts   Probably would not want tube feeds if cognitively unaware   Social Determinants of Health   Financial Resource Strain: Not on file  Food Insecurity: Not on file  Transportation Needs: Not on file  Physical Activity: Not on file  Stress: Not on file  Social Connections: Not on file   Past Surgical History:  Procedure Laterality Date   APPENDECTOMY  ~ 06/2007   BLADDER  REPAIR  ~ 06/2007   "same day after bladder lift" (01/24/2013)   BLADDER SUSPENSION  ~ 06/2007   BREAST BIOPSY Left    DIAGNOSTIC LAPAROSCOPY  1990's & ~ 2000   "I've had a couple; for endometrosis" (01/24/2013)   HERNIA REPAIR     INCISIONAL HERNIA REPAIR N/A 01/24/2013   Procedure: LAPAROSCOPIC INCISIONAL HERNIA;  Surgeon: Shelly Rubenstein, MD;  Location: [facility];  Service: General;  Laterality: N/A;   INSERTION OF MESH N/A 01/24/2013   Procedure: INSERTION OF MESH;  Surgeon: Shelly Rubenstein, MD;  Location: [facility];  Service: General;  Laterality: N/A;   LAPAROSCOPIC INCISIONAL / UMBILICAL / VENTRAL HERNIA REPAIR  01/24/2013   IHR w/mesh/notes   NASAL SEPTUM SURGERY  1980's?   OOPHORECTOMY Right 2009    TONSILLECTOMY  1990's   VAGINAL HYSTERECTOMY  ` 06/2007   Past Surgical History:  Procedure Laterality Date   APPENDECTOMY  ~ 06/2007   BLADDER REPAIR  ~ 06/2007   "same day after bladder lift" (01/24/2013)   BLADDER SUSPENSION  ~ 06/2007   BREAST BIOPSY Left    DIAGNOSTIC LAPAROSCOPY  1990's & ~ 2000   "I've had a couple; for endometrosis" (01/24/2013)   HERNIA REPAIR     INCISIONAL HERNIA REPAIR N/A 01/24/2013   Procedure: LAPAROSCOPIC INCISIONAL HERNIA;  Surgeon: Shelly Rubenstein, MD;  Location: [facility];  Service: General;  Laterality: N/A;   INSERTION OF MESH N/A 01/24/2013   Procedure: INSERTION OF MESH;  Surgeon: Shelly Rubenstein, MD;  Location: [facility];  Service: General;  Laterality: N/A;   LAPAROSCOPIC INCISIONAL / UMBILICAL / VENTRAL HERNIA REPAIR  01/24/2013   IHR w/mesh/notes   NASAL SEPTUM SURGERY  1980's?   OOPHORECTOMY Right 2009   TONSILLECTOMY  1990's   VAGINAL HYSTERECTOMY  ` 06/2007   Past Medical History:  Diagnosis Date   Allergy    Anxiety    Arthritis    "just the norm" (01/24/2013)   ASCVD (arteriosclerotic cardiovascular disease)    Asthma    Celiac disease    Daily headache    "depends on the season" (01/24/2013)   Diabetes mellitus without complication (HCC)    Fibromyalgia    GERD (gastroesophageal reflux disease)    History of colonic polyps    Hyperlipidemia    Hypertension    Migraines    Obesity (BMI 30.0-34.9)    RSD (reflex sympathetic dystrophy)    Sleep apnea    NO MACHINE RECOMMENDED   TIA (transient ischemic attack) ~ 2009   There were no vitals taken for this visit.  Opioid Risk Score:   Fall Risk Score:  `1  Depression screen PHQ 2/9     07/08/2023    1:40 PM 06/27/2023   12:18 PM 06/27/2023   11:45 AM 06/08/2023    1:10 PM 06/08/2023    1:01 PM 04/06/2023   12:03 PM 02/07/2023    1:16 PM  Depression screen PHQ 2/9  Decreased Interest 0 0 1 1 0 1 1  Down, Depressed, Hopeless 0 1 1 1  0 1 1  PHQ - 2 Score 0 1 2 2  0 2 2  Altered sleeping    1      Tired, decreased energy   1      Change in appetite   0      Feeling bad or failure about yourself    2      Trouble concentrating   0  Moving slowly or fidgety/restless   1      Suicidal thoughts   0      PHQ-9 Score   7      Difficult doing work/chores   Very difficult         Review of Systems  Gastrointestinal:  Positive for abdominal pain, diarrhea, nausea and vomiting.  Musculoskeletal:        Right arm pain  All other systems reviewed and are negative.     Objective:   Physical Exam Vitals and nursing note reviewed.  Musculoskeletal:     Comments: No Physical Exam Performed: Virtual Visit          Assessment & Plan:  1.Cervicalgia/ Cervical Radiculitis: No complaints today.. Continue to Monitor. 09/13/2023 2.Complex Regional Pain Syndrome Type 1: Continue Topamax and Cymbalta. 09/13/2023. Refilled: Hydro-codone 5/325mg  two tablets twice a day as needed for pain #120 and  Tramadol 100 mg ER daily #30 and Tramadol 50 mg one tablet by mouth BID. One tablet in the afternoon and one tablet in the evening. We will continue the opioid monitoring program, this consists of regular clinic visits, examinations, urine drug screen, pill counts as well as use of [place] Controlled Substance Reporting system. A 12 month History has been reviewed on the [place] Controlled Substance Reporting System  On 09/13/2023. 3. Depression: with Physical Illness:  Continue current medication regimen with Cymbalta.09/13/2023 4. Insomnia: Continue current medication regimen with  Trazodone one and half tablet at bedtime  09/13/2023 5. Muscle Spasms: Continue current medication regimen with Tizanidine. 11/26 /2024 6. Chronic Pain Syndrome: Continue Ibuprofen BID as needed. 09/13/2023 7. Migraine without Aura: Continue Topamax: Continue to Monitor. 09/13/2023 8. Dysphonia: She reports she has a scheduled appointment with ENT. She has the above complaints for the last two months.  PCP Following. Continue to monitor.  09/13/2023 9. Trigeminal Neuralgia: [name] reports her dentist Dr Hilda Blades diagnose with Trigeminal Neuralgia. Referral placed to Neurology.   F/U in 1 month   Established Patient Virtual Visit Location of Patient: In her Home Location of Provider: In the Office

## 2023-09-24 ENCOUNTER — Encounter: Payer: Self-pay | Admitting: Internal Medicine

## 2023-09-24 DIAGNOSIS — Z860100 Personal history of colon polyps, unspecified: Secondary | ICD-10-CM | POA: Diagnosis not present

## 2023-09-26 ENCOUNTER — Encounter (INDEPENDENT_AMBULATORY_CARE_PROVIDER_SITE_OTHER): Payer: Self-pay | Admitting: Otolaryngology

## 2023-09-26 ENCOUNTER — Ambulatory Visit (INDEPENDENT_AMBULATORY_CARE_PROVIDER_SITE_OTHER): Payer: Medicare HMO | Admitting: Otolaryngology

## 2023-09-26 ENCOUNTER — Ambulatory Visit (INDEPENDENT_AMBULATORY_CARE_PROVIDER_SITE_OTHER): Payer: Medicare HMO | Admitting: Internal Medicine

## 2023-09-26 ENCOUNTER — Encounter: Payer: Self-pay | Admitting: Internal Medicine

## 2023-09-26 VITALS — BP 108/70 | HR 98 | Temp 98.4°F | Ht 67.0 in | Wt 174.0 lb

## 2023-09-26 VITALS — BP 133/82 | HR 109 | Ht 67.0 in

## 2023-09-26 DIAGNOSIS — R0982 Postnasal drip: Secondary | ICD-10-CM

## 2023-09-26 DIAGNOSIS — J45909 Unspecified asthma, uncomplicated: Secondary | ICD-10-CM

## 2023-09-26 DIAGNOSIS — N898 Other specified noninflammatory disorders of vagina: Secondary | ICD-10-CM | POA: Diagnosis not present

## 2023-09-26 DIAGNOSIS — J3089 Other allergic rhinitis: Secondary | ICD-10-CM

## 2023-09-26 DIAGNOSIS — R350 Frequency of micturition: Secondary | ICD-10-CM | POA: Diagnosis not present

## 2023-09-26 DIAGNOSIS — R0789 Other chest pain: Secondary | ICD-10-CM | POA: Insufficient documentation

## 2023-09-26 DIAGNOSIS — R490 Dysphonia: Secondary | ICD-10-CM | POA: Diagnosis not present

## 2023-09-26 DIAGNOSIS — J383 Other diseases of vocal cords: Secondary | ICD-10-CM

## 2023-09-26 DIAGNOSIS — K219 Gastro-esophageal reflux disease without esophagitis: Secondary | ICD-10-CM

## 2023-09-26 DIAGNOSIS — R0981 Nasal congestion: Secondary | ICD-10-CM

## 2023-09-26 LAB — POC URINALSYSI DIPSTICK (AUTOMATED)
Bilirubin, UA: NEGATIVE
Blood, UA: NEGATIVE
Glucose, UA: POSITIVE — AB
Leukocytes, UA: NEGATIVE
Nitrite, UA: NEGATIVE
Protein, UA: POSITIVE — AB
Spec Grav, UA: 1.015 (ref 1.010–1.025)
Urobilinogen, UA: 0.2 U/dL
pH, UA: 6 (ref 5.0–8.0)

## 2023-09-26 MED ORDER — FLUCONAZOLE 150 MG PO TABS: 150.0000 mg | ORAL_TABLET | ORAL | 3 refills | Status: DC

## 2023-09-26 MED ORDER — FLUTICASONE PROPIONATE 50 MCG/ACT NA SUSP: 2.0000 | Freq: Every day | NASAL | 6 refills | Status: DC

## 2023-09-26 NOTE — Telephone Encounter (Signed)
Spoke to pt. She is scheduled for 4pm today. Discussed if her symptoms change prior to that, go to the ER. She states it is an issue that is intermittent for awhile.

## 2023-09-26 NOTE — Patient Instructions (Addendum)
-  Avoid lying down for at least two hours after a meal or after drinking acidic beverages, like soda, or other caffeinated beverages. This can help to prevent stomach contents from flowing back into the esophagus. -Keep your head elevated while you sleep. Using an extra pillow or two can also help to prevent reflux. -Eat smaller and more frequent meals each day instead of a few large meals. This promotes digestion and can aid in preventing heartburn. -Wear loose-fitting clothes to ease pressure on the stomach, which can worsen heartburn and reflux. -Reduce excess weight around the midsection. This can ease pressure on the stomach. Such pressure can force some stomach contents back up the esophagus  - checkout this blog about additional information about reflux [email or web address]  - continue Nexium twice daily  - schedule voice therapy when able  - Take Reflux Gourmet (natural supplement available on [employer]) to help with symptoms of chronic throat irritation     - continue Singulair and Claritin  - start Flonase for post-nasal drainage

## 2023-09-26 NOTE — Progress Notes (Signed)
ENT CONSULT:  Reason for Consult: chronic dysphonia  globus sensation  HPI: Discussed the use of AI scribe software for clinical note transcription with the patient, who gave verbal consent to proceed.  History of Present Illness   The patient is a 56 yoF, with a history of asthma, gastroesophageal reflux disease (GERD), and Barrett's esophagus, presents with a chief complaint of voice changes and a sensation of pressure/lump in her throat. These symptoms began in August, following dental work, and were initially severe, with the patient experiencing complete voice loss. The voice has since improved, but remains altered. The patient also reports a persistent headache and jaw pain, which a previous physician suggested might be due to trigeminal neuralgia (referred to Neurology for this).   The patient's asthma is managed with Singulair and an inhaler, used twice daily, with a rescue inhaler used as needed. They were previously prescribed Diflucan for thrush, which developed after a change in inhaler medication, but this has since resolved. The patient has recently started gabapentin for the suspected trigeminal neuralgia, with mixed results.  The patient's GERD is managed with twice-daily Nexium, and they have regular follow-ups with a gastroenterologist due to Barrett's esophagus. They also take tramadol and Topamax for complex regional pain syndrome.  Since the onset of the voice changes, the patient reports increased shortness of breath and occasional difficulty swallowing. They also report postnasal drainage and a history of environmental allergies, managed with daily Claritin and Singulair. Despite these symptoms, the patient maintains a regular diet. No choking or coughing with eating or drinking.   The patient's medical history also includes a deviated septum, for which they underwent surgery in their early twenties. They report no recent changes to their medication regimen.       Records  Reviewed:  PCP office note 08/29/23  Seems more consistent with trigeminal neuropathy --then neuralgia Is slightly better with the gabapentin at 300 tid Will increase to 300/300/600 Awaiting neurology evaluation--may want to consider MRI and gamma knife Rx if symptoms persist     PCP office note 08/11/23 Likely reflux related Will set up with ENT for direct laryngoscopy      Left facial pain Dentist and pain doctor also feel this could be trigeminal neuralgia Referral made to [facility] Will try empiric Rx for carbamazepine 100 bid pending the appt     Past Medical History:  Diagnosis Date   Allergy    Anxiety    Arthritis    "just the norm" (01/24/2013)   ASCVD (arteriosclerotic cardiovascular disease)    Asthma    Celiac disease    Daily headache    "depends on the season" (01/24/2013)   Diabetes mellitus without complication (HCC)    Fibromyalgia    GERD (gastroesophageal reflux disease)    History of colonic polyps    Hyperlipidemia    Hypertension    Migraines    Obesity (BMI 30.0-34.9)    RSD (reflex sympathetic dystrophy)    Sleep apnea    NO MACHINE RECOMMENDED   TIA (transient ischemic attack) ~ 2009    Past Surgical History:  Procedure Laterality Date   APPENDECTOMY  ~ 06/2007   BLADDER REPAIR  ~ 06/2007   "same day after bladder lift" (01/24/2013)   BLADDER SUSPENSION  ~ 06/2007   BREAST BIOPSY Left    DIAGNOSTIC LAPAROSCOPY  1990's & ~ 2000   "I've had a couple; for endometrosis" (01/24/2013)   HERNIA REPAIR     INCISIONAL HERNIA REPAIR N/A  01/24/2013   Procedure: LAPAROSCOPIC INCISIONAL HERNIA;  Surgeon: Shelly Rubenstein, MD;  [facility];  Service: General;  Laterality: N/A;   INSERTION OF MESH N/A 01/24/2013   Procedure: INSERTION OF MESH;  Surgeon: Shelly Rubenstein, MD;  Location: [facility] OR;  Service: General;  Laterality: N/A;   LAPAROSCOPIC INCISIONAL / UMBILICAL / VENTRAL HERNIA REPAIR  01/24/2013   IHR w/mesh/notes   NASAL SEPTUM SURGERY   1980's?   OOPHORECTOMY Right 2009   TONSILLECTOMY  1990's   VAGINAL HYSTERECTOMY  ` 06/2007    Family History  Problem Relation Age of Onset   Cancer Mother        lung   Hypertension Mother    Cancer Father        colon   Hypertension Sister    Cancer Sister    Cancer Brother    Hyperlipidemia Sister    Birth defects Maternal Grandmother        breast   Breast cancer Maternal Grandmother    Birth defects Paternal Grandmother        uterine, stomach, lung   Breast cancer Paternal Grandmother    Breast cancer Paternal Aunt     Social History:  reports that she has never smoked. She has been exposed to tobacco smoke. She has never used smokeless tobacco. She reports that she does not currently use alcohol. She reports that she does not use drugs.  Allergies:  Allergies  Allergen Reactions   Meperidine Anaphylaxis, Itching and Swelling    REACTION: swelling   Cymbalta [Duloxetine Hcl] Hives    Generic makes her feel like she is crawling out of her skin   Diazepam Hives    REACTION: swelling   Other     Axe Cologne   Shellfish Allergy Hives    Medications: I have reviewed the patient's current medications.  The PMH, PSH, Medications, Allergies, and SH were reviewed and updated.  ROS: Constitutional: Negative for fever, weight loss and weight gain. Cardiovascular: Negative for chest pain and dyspnea on exertion. Respiratory: Is not experiencing shortness of breath at rest. Gastrointestinal: Negative for nausea and vomiting. Neurological: Negative for headaches. Psychiatric: The patient is not nervous/anxious  Blood pressure 133/82, pulse (!) 109, height 5\' 7"  (1.702 m), SpO2 99%.  PHYSICAL EXAM:  Exam: General: Well-developed, well-nourished Communication and Voice: raspy strained  Respiratory Respiratory effort: Equal inspiration and expiration without stridor Cardiovascular Peripheral Vascular: Warm extremities with equal color/perfusion Eyes: No nystagmus  with equal extraocular motion bilaterally Neuro/Psych/Balance: Patient oriented to person, place, and time; Appropriate mood and affect; Gait is intact with no imbalance; Cranial nerves I-XII are intact Head and Face Inspection: Normocephalic and atraumatic without mass or lesion Palpation: Facial skeleton intact without bony stepoffs Salivary Glands: No mass or tenderness Facial Strength: Facial motility symmetric and full bilaterally ENT Pinna: External ear intact and fully developed External canal: Canal is patent with intact skin Tympanic Membrane: Clear and mobile External Nose: No scar or anatomic deformity Internal Nose: Septum is relatively straight. No polyp, or purulence. Mucosal edema and erythema present.  Bilateral inferior turbinate hypertrophy, mild with signs of inferior turbinate reduction surgery Lips, Teeth, and gums: Mucosa and teeth intact and viable TMJ: No pain to palpation with full mobility Oral cavity/oropharynx: No erythema or exudate, no lesions present Nasopharynx: No mass or lesion with intact mucosa Hypopharynx: Intact mucosa without pooling of secretions Larynx Glottic: Full true vocal cord mobility without lesion or mass VF atrophy present  Supraglottic:  Normal appearing epiglottis and AE folds Interarytenoid Space: Moderate pachydermia & edema Subglottic Space: Patent without lesion or edema Neck Neck and Trachea: Midline trachea without mass or lesion Thyroid: No mass or nodularity Lymphatics: No lymphadenopathy  Procedure: Preoperative diagnosis:  Postoperative diagnosis:   Same  Procedure: Flexible fiberoptic laryngoscopy  Surgeon: Ashok Croon, MD  Anesthesia: Topical lidocaine and Afrin Complications: None Condition is stable throughout exam  Indications and consent:  The patient presents to the clinic with Indirect laryngoscopy view was incomplete. Thus it was recommended that they undergo a flexible fiberoptic laryngoscopy. All of  the risks, benefits, and potential complications were reviewed with the patient preoperatively and verbal informed consent was obtained.  Procedure: The patient was seated upright in the clinic. Topical lidocaine and Afrin were applied to the nasal cavity. After adequate anesthesia had occurred, I then proceeded to pass the flexible telescope into the nasal cavity. The nasal cavity was patent without rhinorrhea or polyp. The nasopharynx was also patent without mass or lesion. The base of tongue was visualized and was normal. There were no signs of pooling of secretions in the piriform sinuses. The true vocal folds were mobile bilaterally. There were no signs of glottic or supraglottic mucosal lesion or mass. There was moderate interarytenoid pachydermia and post cricoid edema. The telescope was then slowly withdrawn and the patient tolerated the procedure throughout.  Studies Reviewed: CXR 10/25/22 FINDINGS: The heart size and mediastinal contours are within normal limits. Both lungs are clear. The visualized skeletal structures are unremarkable.   IMPRESSION: No active cardiopulmonary disease.  EGD 07/27/2017   Assessment/Plan: Encounter Diagnoses  Name Primary?   Dysphonia Yes   Age-related vocal fold atrophy    Glottic insufficiency    Chronic GERD    Chronic nasal congestion    Post-nasal drip    Environmental and seasonal allergies     Assessment and Plan    Chronic Voice Changes/dysphonia  Chronic voice changes since August, initially severe with complete voice loss. Current symptoms include raspy voice, intermittent throat soreness, and sensation of throat pressure/globus sensation. Examination revealed vocal fold atrophy, postnasal drainage, and reflux changes. No masses, tumors, or lesions observed. Differential includes reflux-induced changes, postnasal drainage, and VF atrophy with glottic insufficiency. Discussed that inhalers, particularly steroid inhalers, can induce vocal  cord changes such as swelling and increased reflux. Explained that vocal fold atrophy is common with aging and can cause difficulty projecting. Recommended focusing on controlling postnasal drainage and reflux. Speech therapy suggested for vocal fold atrophy but deferred due to financial constraints. Discussed dietary and lifestyle modifications for reflux management. - Start Flonase 2 puffs b/l nares BID - Continue Singulair 10 mg daily and Claritin 10 mg daily - Continue Nexium 40 mg twice daily - Recommend seaweed supplement for reflux Reflux Gourmet - Provide dietary and lifestyle modification guidance for reflux - Follow-up in a few months to reassess symptoms  Gastroesophageal Reflux Disease (GERD) Chronic GERD with Barrett's esophagus. Managed with Nexium twice daily. Symptoms include throat pressure and postnasal drainage. Recommended dietary and lifestyle modifications for reflux management.  - Continue Nexium 40 mg twice daily - Recommend Reflux Gourmet for reflux - Provided dietary and lifestyle modification guidance for reflux - Follow-up with gastroenterologist as scheduled  Asthma Chronic asthma managed with Singulair and inhalers. No current exacerbation. Inhalers may contribute to vocal changes. Advised to continue current asthma management  - Continue current asthma management with Singulair and inhalers  Trigeminal Neuralgia Suspected trigeminal neuralgia with facial pain  and headache. Currently managed with gabapentin. Awaiting neurology consultation in February. - Continue gabapentin - Follow-up with neurology in February 2025  Complex Regional Pain Syndrome Chronic condition managed with tramadol and Topamax. No acute issues discussed. - Continue current management with tramadol and Topamax  Follow-up - Follow-up in a few months to reassess voice changes - Follow-up with neurology in February for trigeminal neuralgia - Continue follow-up with gastroenterologist for  Barrett's esophagus.  - will consider voice therapy if sx persist in the future - videostrobe when she returns    Thank you for allowing me to participate in the care of this patient. Please do not hesitate to contact me with any questions or concerns.   Ashok Croon, MD Otolaryngology [facility] Phone: [phone] Fax: [phone]    09/26/2023, 7:54 PM

## 2023-09-26 NOTE — Progress Notes (Signed)
Subjective:    Patient ID: [name], female    DOB: 1966-03-31, 57 y.o.   MRN: [id number]  HPI Here with several concerns--chest heaviness, urinary symptoms  Has upper chest heaviness that comes and goes--goes back months Relates to all the other symptoms--head and voice etc Did see ENT--saw post nasal drip and thinks it is reflux also Will take her breath away--has to sigh Can even be with sitting---not really exertional Can last up to an hour No heartburn and not related to meals Has had to rest more when doing chores   Has external "feminine itch" Changed back to prior soap ?slight vaginal discharge No clear rash Since starting the jardiance Hasn't been able to test sugars--will be getting a new machine  Current Outpatient Medications on File Prior to Visit  Medication Sig Dispense Refill   Accu-Chek Softclix Lancets lancets Use as instructed 100 each 5   albuterol (VENTOLIN HFA) 108 (90 Base) MCG/ACT inhaler Inhale 2 puffs into the lungs every 6 (six) hours as needed for wheezing or shortness of breath. 18 g 1   budesonide-formoterol (SYMBICORT) 160-4.5 MCG/ACT inhaler Inhale 2 puffs into the lungs daily. 1 each 12   CYMBALTA 60 MG capsule Take 60 mg by mouth daily.     DULoxetine (CYMBALTA) 30 MG capsule Take 1 capsule (30 mg total) by mouth daily. 90 capsule 3   empagliflozin (JARDIANCE) 10 MG TABS tablet Take 1 tablet (10 mg total) by mouth daily before breakfast. 30 tablet 11   esomeprazole (NEXIUM) 40 MG capsule Take 40 mg by mouth daily.     fluticasone (FLONASE) 50 MCG/ACT nasal spray Place 2 sprays into both nostrils daily. 16 g 6   gabapentin (NEURONTIN) 300 MG capsule Take 1 capsule (300 mg total) by mouth 2 (two) times daily. And 2 capsules at bedtime 120 capsule 5   glucose blood (ACCU-CHEK GUIDE) test strip See admin instructions.     HYDROcodone-acetaminophen (NORCO/VICODIN) 5-325 MG tablet Take 2 tablets by mouth 2 (two) times daily as needed for  moderate pain (pain score 4-6). 120 tablet 0   ibuprofen (ADVIL) 600 MG tablet Take 1 tablet (600 mg total) by mouth every 8 (eight) hours as needed for mild pain (pain score 1-3). 90 tablet 5   Lancets MISC      loratadine (CLARITIN) 10 MG tablet Take 10 mg by mouth daily.     metFORMIN (GLUCOPHAGE-XR) 500 MG 24 hr tablet TAKE 2 TABLETS BY MOUTH EVERY DAY WITH BREAKFAST 180 tablet 3   montelukast (SINGULAIR) 10 MG tablet TAKE 1 TABLET BY MOUTH EVERYDAY AT BEDTIME 90 tablet 3   olmesartan (BENICAR) 40 MG tablet TAKE 1 TABLET BY MOUTH EVERY DAY 90 tablet 3   omeprazole (PRILOSEC) 40 MG capsule Take 40 mg by mouth 2 (two) times daily.     rosuvastatin (CRESTOR) 5 MG tablet TAKE 1 TABLET (5 MG TOTAL) BY MOUTH DAILY. 90 tablet 3   tiZANidine (ZANAFLEX) 4 MG tablet Take 1 tablet (4 mg total) by mouth every 6 (six) hours as needed for muscle spasms. 120 tablet 3   topiramate (TOPAMAX) 100 MG tablet TAKE 1 TABLET BY MOUTH EVERY MORNING AND TAKE 2 TABLETS BY MOUTH EVERY EVENING 270 tablet 1   traMADol (ULTRAM) 50 MG tablet TAKE 1 TABLET BY MOUTH TWICE A DAY. DIRECTION CHANGE. 60 tablet 2   traMADol (ULTRAM-ER) 100 MG 24 hr tablet Take 1 tablet (100 mg total) by mouth daily. 30 tablet 2   traZODone (  DESYREL) 50 MG tablet TAKE 1 AND 1/2 TABLETS BY MOUTH AT BEDTIME 135 tablet 3   fluconazole (DIFLUCAN) 150 MG tablet Take 1 tablet (150 mg total) by mouth once a week. (Patient not taking: Reported on 09/26/2023) 7 tablet 1   [DISCONTINUED] losartan (COZAAR) 100 MG tablet TAKE 1 TABLET BY MOUTH EVERY DAY 90 tablet 0   No current facility-administered medications on file prior to visit.    Allergies  Allergen Reactions   Meperidine Anaphylaxis, Itching and Swelling    REACTION: swelling   Cymbalta [Duloxetine Hcl] Hives    Generic makes her feel like she is crawling out of her skin   Diazepam Hives    REACTION: swelling   Other     Axe Cologne   Shellfish Allergy Hives    Past Medical History:   Diagnosis Date   Allergy    Anxiety    Arthritis    "just the norm" (01/24/2013)   ASCVD (arteriosclerotic cardiovascular disease)    Asthma    Celiac disease    Daily headache    "depends on the season" (01/24/2013)   Diabetes mellitus without complication (HCC)    Fibromyalgia    GERD (gastroesophageal reflux disease)    History of colonic polyps    Hyperlipidemia    Hypertension    Migraines    Obesity (BMI 30.0-34.9)    RSD (reflex sympathetic dystrophy)    Sleep apnea    NO MACHINE RECOMMENDED   TIA (transient ischemic attack) ~ 2009    Past Surgical History:  Procedure Laterality Date   APPENDECTOMY  ~ 06/2007   BLADDER REPAIR  ~ 06/2007   "same day after bladder lift" (01/24/2013)   BLADDER SUSPENSION  ~ 06/2007   BREAST BIOPSY Left    DIAGNOSTIC LAPAROSCOPY  1990's & ~ 2000   "I've had a couple; for endometrosis" (01/24/2013)   HERNIA REPAIR     INCISIONAL HERNIA REPAIR N/A 01/24/2013   Procedure: LAPAROSCOPIC INCISIONAL HERNIA;  Surgeon: Shelly Rubenstein, MD;  Location: [facility] OR;  Service: General;  Laterality: N/A;   INSERTION OF MESH N/A 01/24/2013   Procedure: INSERTION OF MESH;  Surgeon: Shelly Rubenstein, MD;  Location: [facility] OR;  Service: General;  Laterality: N/A;   LAPAROSCOPIC INCISIONAL / UMBILICAL / VENTRAL HERNIA REPAIR  01/24/2013   IHR w/mesh/notes   NASAL SEPTUM SURGERY  1980's?   OOPHORECTOMY Right 2009   TONSILLECTOMY  1990's   VAGINAL HYSTERECTOMY  ` 06/2007    Family History  Problem Relation Age of Onset   Cancer Mother        lung   Hypertension Mother    Cancer Father        colon   Hypertension Sister    Cancer Sister    Cancer Brother    Hyperlipidemia Sister    Birth defects Maternal Grandmother        breast   Breast cancer Maternal Grandmother    Birth defects Paternal Grandmother        uterine, stomach, lung   Breast cancer Paternal Grandmother    Breast cancer Paternal Aunt     Social History   Socioeconomic History   Marital  status: Married    Spouse name: Not on file   Number of children: 2   Years of education: Not on file   Highest education level: Not on file  Occupational History   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  Tobacco Use   Smoking status: Never  Passive exposure: Current   Smokeless tobacco: Never  Vaping Use   Vaping status: Never Used  Substance and Sexual Activity   Alcohol use: Not Currently    Alcohol/week: 0.0 standard drinks of alcohol    Comment: 01/24/2013 "drink or 2 once or /twice/year, if that"   Drug use: No   Sexual activity: Not Currently  Other Topics Concern   Not on file  Social History Narrative   No living will   Would want husband as POA--then sister, [name]   Would accept resuscitation attempts   Probably would not want tube feeds if cognitively unaware   Social Determinants of Health   Financial Resource Strain: Not on file  Food Insecurity: Not on file  Transportation Needs: Not on file  Physical Activity: Not on file  Stress: Not on file  Social Connections: Not on file  Intimate Partner Violence: Not on file   Review of Systems No burning dysuria--just having frequency No fever    Objective:   Physical Exam         Assessment & Plan:

## 2023-09-26 NOTE — Assessment & Plan Note (Signed)
No dysuria Likely vaginal yeast related to the jardiance Urinalysis negative for leuks and nitrite--but 3+ glucose (expected) Will try fluconazole 1-2 times a week. If persists, might have to change the jardiance

## 2023-09-26 NOTE — Assessment & Plan Note (Signed)
Time wise corresponds with the LPR symptoms of hoarseness, etc---so likely reflux EKG shows sinus at 90, incomplete RBBB pattern, no ischemia. Essentially the same as 4/22  She still is at moderate risk for CAD Will set up with cardiology to consider testing---can't really do functional testing well, so likely will get cardiac CT

## 2023-09-26 NOTE — Addendum Note (Signed)
Addended by: Eual Fines on: 09/26/2023 04:58 PM   Modules accepted: Orders

## 2023-09-26 NOTE — Addendum Note (Signed)
Addended by: Eual Fines on: 09/26/2023 04:49 PM   Modules accepted: Orders

## 2023-10-07 ENCOUNTER — Other Ambulatory Visit: Payer: Self-pay | Admitting: Internal Medicine

## 2023-10-07 ENCOUNTER — Encounter: Payer: Self-pay | Admitting: Internal Medicine

## 2023-10-10 ENCOUNTER — Encounter: Payer: Self-pay | Admitting: Registered Nurse

## 2023-10-10 ENCOUNTER — Encounter: Payer: Worker's Compensation | Attending: Registered Nurse | Admitting: Registered Nurse

## 2023-10-10 VITALS — BP 100/67 | HR 88 | Ht 67.0 in | Wt 175.0 lb

## 2023-10-10 DIAGNOSIS — G4700 Insomnia, unspecified: Secondary | ICD-10-CM | POA: Insufficient documentation

## 2023-10-10 DIAGNOSIS — G500 Trigeminal neuralgia: Secondary | ICD-10-CM | POA: Diagnosis present

## 2023-10-10 DIAGNOSIS — G894 Chronic pain syndrome: Secondary | ICD-10-CM | POA: Insufficient documentation

## 2023-10-10 DIAGNOSIS — G90512 Complex regional pain syndrome I of left upper limb: Secondary | ICD-10-CM | POA: Diagnosis not present

## 2023-10-10 DIAGNOSIS — Z79891 Long term (current) use of opiate analgesic: Secondary | ICD-10-CM | POA: Insufficient documentation

## 2023-10-10 DIAGNOSIS — F3289 Other specified depressive episodes: Secondary | ICD-10-CM | POA: Diagnosis present

## 2023-10-10 DIAGNOSIS — Z5181 Encounter for therapeutic drug level monitoring: Secondary | ICD-10-CM | POA: Diagnosis present

## 2023-10-10 DIAGNOSIS — M62838 Other muscle spasm: Secondary | ICD-10-CM | POA: Diagnosis present

## 2023-10-10 MED ORDER — HYDROCODONE-ACETAMINOPHEN 5-325 MG PO TABS: 2.0000 | ORAL_TABLET | Freq: Two times a day (BID) | ORAL | 0 refills | Status: DC | PRN

## 2023-10-10 NOTE — Progress Notes (Signed)
Subjective:    Patient ID: [name], female    DOB: 28-May-1966, 57 y.o.   MRN: [id number]  HPI: [name] is a 57 y.o. female who returns for follow up appointment for chronic pain and medication refill. She states her pain is located in her left arm and left hand.  She rates her pain 7. Her current exercise regime is walking and performing stretching exercises.  [name] Morphine equivalent is 60.00 MME.   Last Oral Swab was Performed on 10/11/20/2022, it was consistent.    Vitals" 100/67 P 88 97%   Pain Inventory Average Pain 7 Pain Right Now 7 My pain is constant, sharp, burning, dull, stabbing, tingling, and aching  In the last 24 hours, has pain interfered with the following? General activity 5 Relation with others 5 Enjoyment of life 5 What TIME of day is your pain at its worst? morning , daytime, evening, and night Sleep (in general) Poor  Pain is worse with: walking, bending, sitting, inactivity, standing, and some activites Pain improves with: rest, heat/ice, pacing activities, and medication Relief from Meds: 6  Family History  Problem Relation Age of Onset   Cancer Mother        lung   Hypertension Mother    Cancer Father        colon   Hypertension Sister    Cancer Sister    Cancer Brother    Hyperlipidemia Sister    Birth defects Maternal Grandmother        breast   Breast cancer Maternal Grandmother    Birth defects Paternal Grandmother        uterine, stomach, lung   Breast cancer Paternal Grandmother    Breast cancer Paternal Aunt    Social History   Socioeconomic History   Marital status: Married    Spouse name: Not on file   Number of children: 2   Years of education: Not on file   Highest education level: Not on file  Occupational History   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  Tobacco Use   Smoking status: Never    Passive exposure: Current   Smokeless tobacco: Never  Vaping Use   Vaping status: Never  Used  Substance and Sexual Activity   Alcohol use: Not Currently    Alcohol/week: 0.0 standard drinks of alcohol    Comment: 01/24/2013 "drink or 2 once or /twice/year, if that"   Drug use: No   Sexual activity: Not Currently  Other Topics Concern   Not on file  Social History Narrative   No living will   Would want husband as POA--then sister, [name]   Would accept resuscitation attempts   Probably would not want tube feeds if cognitively unaware   Social Drivers of Health   Financial Resource Strain: Not on file  Food Insecurity: Not on file  Transportation Needs: Not on file  Physical Activity: Not on file  Stress: Not on file  Social Connections: Not on file   Past Surgical History:  Procedure Laterality Date   APPENDECTOMY  ~ 06/2007   BLADDER REPAIR  ~ 06/2007   "same day after bladder lift" (01/24/2013)   BLADDER SUSPENSION  ~ 06/2007   BREAST BIOPSY Left    DIAGNOSTIC LAPAROSCOPY  1990's & ~ 2000   "I've had a couple; for endometrosis" (01/24/2013)   HERNIA REPAIR     INCISIONAL HERNIA REPAIR N/A 01/24/2013   Procedure: LAPAROSCOPIC INCISIONAL HERNIA;  Surgeon: Shelly Rubenstein, MD;  Location:  [facility];  Service: General;  Laterality: N/A;   INSERTION OF MESH N/A 01/24/2013   Procedure: INSERTION OF MESH;  Surgeon: Shelly Rubenstein, MD;  Location: [facility];  Service: General;  Laterality: N/A;   LAPAROSCOPIC INCISIONAL / UMBILICAL / VENTRAL HERNIA REPAIR  01/24/2013   IHR w/mesh/notes   NASAL SEPTUM SURGERY  1980's?   OOPHORECTOMY Right 2009   TONSILLECTOMY  1990's   VAGINAL HYSTERECTOMY  ` 06/2007   Past Surgical History:  Procedure Laterality Date   APPENDECTOMY  ~ 06/2007   BLADDER REPAIR  ~ 06/2007   "same day after bladder lift" (01/24/2013)   BLADDER SUSPENSION  ~ 06/2007   BREAST BIOPSY Left    DIAGNOSTIC LAPAROSCOPY  1990's & ~ 2000   "I've had a couple; for endometrosis" (01/24/2013)   HERNIA REPAIR     INCISIONAL HERNIA REPAIR N/A 01/24/2013   Procedure: LAPAROSCOPIC  INCISIONAL HERNIA;  Surgeon: Shelly Rubenstein, MD;  Location: [facility];  Service: General;  Laterality: N/A;   INSERTION OF MESH N/A 01/24/2013   Procedure: INSERTION OF MESH;  Surgeon: Shelly Rubenstein, MD;  Location: [facility];  Service: General;  Laterality: N/A;   LAPAROSCOPIC INCISIONAL / UMBILICAL / VENTRAL HERNIA REPAIR  01/24/2013   IHR w/mesh/notes   NASAL SEPTUM SURGERY  1980's?   OOPHORECTOMY Right 2009   TONSILLECTOMY  1990's   VAGINAL HYSTERECTOMY  ` 06/2007   Past Medical History:  Diagnosis Date   Allergy    Anxiety    Arthritis    "just the norm" (01/24/2013)   ASCVD (arteriosclerotic cardiovascular disease)    Asthma    Celiac disease    Daily headache    "depends on the season" (01/24/2013)   Diabetes mellitus without complication (HCC)    Fibromyalgia    GERD (gastroesophageal reflux disease)    History of colonic polyps    Hyperlipidemia    Hypertension    Migraines    Obesity (BMI 30.0-34.9)    RSD (reflex sympathetic dystrophy)    Sleep apnea    NO MACHINE RECOMMENDED   TIA (transient ischemic attack) ~ 2009   BP 100/67   Pulse 88   Ht 5\' 7"  (1.702 m)   Wt 175 lb (79.4 kg)   SpO2 97%   BMI 27.41 kg/m   Opioid Risk Score:   Fall Risk Score:  `1  Depression screen PHQ 2/9     10/10/2023    1:14 PM 07/08/2023    1:40 PM 06/27/2023   12:18 PM 06/27/2023   11:45 AM 06/08/2023    1:10 PM 06/08/2023    1:01 PM 04/06/2023   12:03 PM  Depression screen PHQ 2/9  Decreased Interest 1 0 0 1 1 0 1  Down, Depressed, Hopeless 1 0 1 1 1  0 1  PHQ - 2 Score 2 0 1 2 2  0 2  Altered sleeping    1     Tired, decreased energy    1     Change in appetite    0     Feeling bad or failure about yourself     2     Trouble concentrating    0     Moving slowly or fidgety/restless    1     Suicidal thoughts    0     PHQ-9 Score    7     Difficult doing work/chores    Very difficult       Review  of Systems  Musculoskeletal:        Left arm pain  All other systems reviewed  and are negative.      Objective:   Physical Exam Vitals and nursing note reviewed.  Constitutional:      Appearance: Normal appearance.  Cardiovascular:     Rate and Rhythm: Normal rate and regular rhythm.     Pulses: Normal pulses.     Heart sounds: Normal heart sounds.  Pulmonary:     Effort: Pulmonary effort is normal.     Breath sounds: Normal breath sounds.  Musculoskeletal:     Comments: Normal Muscle Bulk and Muscle Testing Reveals:  Upper Extremities: Right: Full ROM and Muscle Strength 4/5 Left Upper Extremity: Decreased ROM 90 Degrees and Muscle Strength 4/5 Left AC Joint Tenderness Thoracic Hypersensitivity : T-1-T-7  Lower Extremities : Decreased ROM and Muscle Strength 5/5 Bilateral Lower Extremities Flexion Produces Pain into her Bilateral Lower extremities and Bilateral Feet  Arises from table slowly Antalgic Gait     Skin:    General: Skin is warm and dry.  Neurological:     Mental Status: She is alert and oriented to person, place, and time.  Psychiatric:        Mood and Affect: Mood normal.        Behavior: Behavior normal.          Assessment & Plan:  1.Cervicalgia/ Cervical Radiculitis: No complaints today.. Continue to Monitor. 10/10/2023 2.Complex Regional Pain Syndrome Type 1: Continue Topamax and Cymbalta. 10/10/2023. Refilled: Hydro-codone 5/325mg  two tablets twice a day as needed for pain #120 and  Tramadol 100 mg ER daily #30 and Tramadol 50 mg one tablet by mouth BID. One tablet in the afternoon and one tablet in the evening. We will continue the opioid monitoring program, this consists of regular clinic visits, examinations, urine drug screen, pill counts as well as use of [place] Controlled Substance Reporting system. A 12 month History has been reviewed on the [place] Controlled Substance Reporting System  On 10/10/2023. 3. Depression: with Physical Illness:  Continue current medication regimen with Cymbalta.10/10/2023 4.  Insomnia: Continue current medication regimen with  Trazodone one and half tablet at bedtime  10/10/2023 5. Muscle Spasms: Continue current medication regimen with Tizanidine. 12/23 /2024 6. Chronic Pain Syndrome: Continue Ibuprofen BID as needed. 10/10/2023 7. Migraine without Aura: Continue Topamax: Continue to Monitor. 10/10/2023 8. Dysphonia: She reports she was seen by ENT. S PCP Following. Continue to monitor.  10/10/2023 9. Trigeminal Neuralgia: [name] reports her dentist Dr Hilda Blades diagnose with Trigeminal Neuralgia. Referral was placed for Neurology. She has a scheduled appointment with [facility]. 10/10/2023   F/U in 1 month

## 2023-10-13 ENCOUNTER — Ambulatory Visit: Payer: Medicare HMO | Admitting: Internal Medicine

## 2023-10-13 ENCOUNTER — Encounter: Payer: Self-pay | Admitting: Internal Medicine

## 2023-10-13 VITALS — BP 118/78 | HR 87 | Temp 98.3°F | Ht 67.0 in | Wt 176.0 lb

## 2023-10-13 DIAGNOSIS — J0140 Acute pansinusitis, unspecified: Secondary | ICD-10-CM | POA: Diagnosis not present

## 2023-10-13 DIAGNOSIS — G501 Atypical facial pain: Secondary | ICD-10-CM | POA: Diagnosis not present

## 2023-10-13 MED ORDER — DOXYCYCLINE HYCLATE 100 MG PO TABS: 100.0000 mg | ORAL_TABLET | Freq: Two times a day (BID) | ORAL | 1 refills | Status: DC

## 2023-10-13 NOTE — Progress Notes (Signed)
Subjective:    Patient ID: [name], female    DOB: Jun 14, 1966, 57 y.o.   MRN: [id number]  HPI Here due to a sinus problem and persistent left facial pain Started a week ago Neck pain Eye discharge Left ear pain and tooth pain No fever Some cough---mostly dry Some SOB---with exertion or some cough  Using coricidin ---may have helped some Also using nasal spray (from ENT)  Facial pain had been better--with the gabapentin Now having marked facial symptoms  Current Outpatient Medications on File Prior to Visit  Medication Sig Dispense Refill   Accu-Chek Softclix Lancets lancets Use as instructed 100 each 5   albuterol (VENTOLIN HFA) 108 (90 Base) MCG/ACT inhaler Inhale 2 puffs into the lungs every 6 (six) hours as needed for wheezing or shortness of breath. 18 g 1   budesonide-formoterol (SYMBICORT) 160-4.5 MCG/ACT inhaler Inhale 2 puffs into the lungs daily. 1 each 12   CYMBALTA 60 MG capsule Take 60 mg by mouth daily.     DULoxetine (CYMBALTA) 30 MG capsule Take 1 capsule (30 mg total) by mouth daily. 90 capsule 3   empagliflozin (JARDIANCE) 10 MG TABS tablet Take 1 tablet (10 mg total) by mouth daily before breakfast. 30 tablet 11   esomeprazole (NEXIUM) 40 MG capsule Take 40 mg by mouth daily.     fluconazole (DIFLUCAN) 150 MG tablet Take 1 tablet (150 mg total) by mouth 2 (two) times a week. 8 tablet 3   fluticasone (FLONASE) 50 MCG/ACT nasal spray Place 2 sprays into both nostrils daily. 16 g 6   gabapentin (NEURONTIN) 300 MG capsule Take 1 capsule (300 mg total) by mouth 2 (two) times daily. And 2 capsules at bedtime 120 capsule 5   glucose blood (ACCU-CHEK GUIDE) test strip See admin instructions.     HYDROcodone-acetaminophen (NORCO/VICODIN) 5-325 MG tablet Take 2 tablets by mouth 2 (two) times daily as needed for moderate pain (pain score 4-6). 120 tablet 0   ibuprofen (ADVIL) 600 MG tablet Take 1 tablet (600 mg total) by mouth every 8 (eight) hours as needed  for mild pain (pain score 1-3). 90 tablet 5   Lancets MISC      loratadine (CLARITIN) 10 MG tablet Take 10 mg by mouth daily.     metFORMIN (GLUCOPHAGE-XR) 500 MG 24 hr tablet TAKE 2 TABLETS BY MOUTH EVERY DAY WITH BREAKFAST 180 tablet 3   montelukast (SINGULAIR) 10 MG tablet TAKE 1 TABLET BY MOUTH EVERYDAY AT BEDTIME 90 tablet 3   olmesartan (BENICAR) 40 MG tablet TAKE 1 TABLET BY MOUTH EVERY DAY 90 tablet 3   omeprazole (PRILOSEC) 40 MG capsule Take 40 mg by mouth 2 (two) times daily.     rosuvastatin (CRESTOR) 5 MG tablet TAKE 1 TABLET (5 MG TOTAL) BY MOUTH DAILY. 90 tablet 3   tiZANidine (ZANAFLEX) 4 MG tablet Take 1 tablet (4 mg total) by mouth every 6 (six) hours as needed for muscle spasms. 120 tablet 3   topiramate (TOPAMAX) 100 MG tablet TAKE 1 TABLET BY MOUTH EVERY MORNING AND TAKE 2 TABLETS BY MOUTH EVERY EVENING 270 tablet 1   traMADol (ULTRAM) 50 MG tablet TAKE 1 TABLET BY MOUTH TWICE A DAY. DIRECTION CHANGE. 60 tablet 2   traMADol (ULTRAM-ER) 100 MG 24 hr tablet Take 1 tablet (100 mg total) by mouth daily. 30 tablet 2   traZODone (DESYREL) 50 MG tablet TAKE 1 AND 1/2 TABLETS BY MOUTH AT BEDTIME 135 tablet 3   [DISCONTINUED] losartan (COZAAR)  100 MG tablet TAKE 1 TABLET BY MOUTH EVERY DAY 90 tablet 0   No current facility-administered medications on file prior to visit.    Allergies  Allergen Reactions   Meperidine Anaphylaxis, Itching and Swelling    REACTION: swelling   Cymbalta [Duloxetine Hcl] Hives    Generic makes her feel like she is crawling out of her skin   Diazepam Hives    REACTION: swelling   Other     Axe Cologne   Shellfish Allergy Hives    Past Medical History:  Diagnosis Date   Allergy    Anxiety    Arthritis    "just the norm" (01/24/2013)   ASCVD (arteriosclerotic cardiovascular disease)    Asthma    Celiac disease    Daily headache    "depends on the season" (01/24/2013)   Diabetes mellitus without complication (HCC)    Fibromyalgia    GERD  (gastroesophageal reflux disease)    History of colonic polyps    Hyperlipidemia    Hypertension    Migraines    Obesity (BMI 30.0-34.9)    RSD (reflex sympathetic dystrophy)    Sleep apnea    NO MACHINE RECOMMENDED   TIA (transient ischemic attack) ~ 2009    Past Surgical History:  Procedure Laterality Date   APPENDECTOMY  ~ 06/2007   BLADDER REPAIR  ~ 06/2007   "same day after bladder lift" (01/24/2013)   BLADDER SUSPENSION  ~ 06/2007   BREAST BIOPSY Left    DIAGNOSTIC LAPAROSCOPY  1990's & ~ 2000   "I've had a couple; for endometrosis" (01/24/2013)   HERNIA REPAIR     INCISIONAL HERNIA REPAIR N/A 01/24/2013   Procedure: LAPAROSCOPIC INCISIONAL HERNIA;  Surgeon: Shelly Rubenstein, MD;  Location: [facility];  Service: General;  Laterality: N/A;   INSERTION OF MESH N/A 01/24/2013   Procedure: INSERTION OF MESH;  Surgeon: Shelly Rubenstein, MD;  Location: [facility];  Service: General;  Laterality: N/A;   LAPAROSCOPIC INCISIONAL / UMBILICAL / VENTRAL HERNIA REPAIR  01/24/2013   IHR w/mesh/notes   NASAL SEPTUM SURGERY  1980's?   OOPHORECTOMY Right 2009   TONSILLECTOMY  1990's   VAGINAL HYSTERECTOMY  ` 06/2007    Family History  Problem Relation Age of Onset   Cancer Mother        lung   Hypertension Mother    Cancer Father        colon   Hypertension Sister    Cancer Sister    Cancer Brother    Hyperlipidemia Sister    Birth defects Maternal Grandmother        breast   Breast cancer Maternal Grandmother    Birth defects Paternal Grandmother        uterine, stomach, lung   Breast cancer Paternal Grandmother    Breast cancer Paternal Aunt     Social History   Socioeconomic History   Marital status: Married    Spouse name: Not on file   Number of children: 2   Years of education: Not on file   Highest education level: Not on file  Occupational History   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  Tobacco Use   Smoking status: Never    Passive exposure: Current   Smokeless tobacco: Never   Vaping Use   Vaping status: Never Used  Substance and Sexual Activity   Alcohol use: Not Currently    Alcohol/week: 0.0 standard drinks of alcohol    Comment: 01/24/2013 "drink or 2  once or /twice/year, if that"   Drug use: No   Sexual activity: Not Currently  Other Topics Concern   Not on file  Social History Narrative   No living will   Would want husband as POA--then sister, [name]   Would accept resuscitation attempts   Probably would not want tube feeds if cognitively unaware   Social Drivers of Corporate investment banker Strain: Not on file  Food Insecurity: Not on file  Transportation Needs: Not on file  Physical Activity: Not on file  Stress: Not on file  Social Connections: Not on file  Intimate Partner Violence: Not on file   Review of Systems No N/V Eating okay     Objective:   Physical Exam Constitutional:      Appearance: Normal appearance.  HENT:     Head:     Comments: Frontal and maxillary tenderness    Right Ear: Tympanic membrane and ear canal normal.     Left Ear: Tympanic membrane and ear canal normal.     Mouth/Throat:     Pharynx: No oropharyngeal exudate or posterior oropharyngeal erythema.  Pulmonary:     Effort: Pulmonary effort is normal.     Breath sounds: Normal breath sounds. No wheezing or rales.  Musculoskeletal:     Cervical back: Neck supple.  Lymphadenopathy:     Cervical: No cervical adenopathy.  Neurological:     Mental Status: She is alert.            Assessment & Plan:

## 2023-10-13 NOTE — Assessment & Plan Note (Signed)
Pain had been tolerable with the gabapentin I think it is mostly the sinusitis Still awaiting the neurology evaluation

## 2023-10-13 NOTE — Assessment & Plan Note (Signed)
Got sick after her son Clearly just sinus--not chest--but will cover in case this could be Mycoplasma Analgesics Nasal steroid Doxy 100mg  bid x 7 days (with refill prn)

## 2023-10-25 DIAGNOSIS — Z1211 Encounter for screening for malignant neoplasm of colon: Secondary | ICD-10-CM | POA: Diagnosis not present

## 2023-10-25 DIAGNOSIS — E119 Type 2 diabetes mellitus without complications: Secondary | ICD-10-CM | POA: Diagnosis not present

## 2023-10-25 DIAGNOSIS — K635 Polyp of colon: Secondary | ICD-10-CM | POA: Diagnosis not present

## 2023-10-25 DIAGNOSIS — Z860100 Personal history of colon polyps, unspecified: Secondary | ICD-10-CM | POA: Diagnosis not present

## 2023-10-31 DIAGNOSIS — K635 Polyp of colon: Secondary | ICD-10-CM | POA: Diagnosis not present

## 2023-11-08 ENCOUNTER — Encounter: Payer: Self-pay | Admitting: Registered Nurse

## 2023-11-08 ENCOUNTER — Encounter: Payer: Worker's Compensation | Attending: Registered Nurse | Admitting: Registered Nurse

## 2023-11-08 VITALS — BP 118/71 | HR 95 | Ht 67.0 in

## 2023-11-08 DIAGNOSIS — F3289 Other specified depressive episodes: Secondary | ICD-10-CM | POA: Insufficient documentation

## 2023-11-08 DIAGNOSIS — M542 Cervicalgia: Secondary | ICD-10-CM | POA: Diagnosis not present

## 2023-11-08 DIAGNOSIS — G4700 Insomnia, unspecified: Secondary | ICD-10-CM | POA: Insufficient documentation

## 2023-11-08 DIAGNOSIS — G90512 Complex regional pain syndrome I of left upper limb: Secondary | ICD-10-CM | POA: Diagnosis not present

## 2023-11-08 DIAGNOSIS — G8929 Other chronic pain: Secondary | ICD-10-CM | POA: Diagnosis present

## 2023-11-08 DIAGNOSIS — M5412 Radiculopathy, cervical region: Secondary | ICD-10-CM | POA: Diagnosis not present

## 2023-11-08 DIAGNOSIS — Z5181 Encounter for therapeutic drug level monitoring: Secondary | ICD-10-CM | POA: Diagnosis present

## 2023-11-08 DIAGNOSIS — M546 Pain in thoracic spine: Secondary | ICD-10-CM | POA: Insufficient documentation

## 2023-11-08 DIAGNOSIS — M25511 Pain in right shoulder: Secondary | ICD-10-CM | POA: Diagnosis not present

## 2023-11-08 DIAGNOSIS — G894 Chronic pain syndrome: Secondary | ICD-10-CM | POA: Insufficient documentation

## 2023-11-08 DIAGNOSIS — M25512 Pain in left shoulder: Secondary | ICD-10-CM | POA: Insufficient documentation

## 2023-11-08 DIAGNOSIS — Z79891 Long term (current) use of opiate analgesic: Secondary | ICD-10-CM | POA: Insufficient documentation

## 2023-11-08 DIAGNOSIS — M62838 Other muscle spasm: Secondary | ICD-10-CM | POA: Diagnosis present

## 2023-11-08 MED ORDER — HYDROCODONE-ACETAMINOPHEN 5-325 MG PO TABS: 2.0000 | ORAL_TABLET | Freq: Two times a day (BID) | ORAL | 0 refills | Status: DC | PRN

## 2023-11-08 NOTE — Progress Notes (Signed)
Subjective:    Patient ID: [name], female    DOB: 1966-04-09, 58 y.o.   MRN: [id number]  HPI: [name] is a 58 y.o. female who returns for follow up appointment for chronic pain and medication refill. She states her pain is located in her neck radiating into her bilateral shoulders L>R, left arm pain and left hand pain. She also reports mid- back pain denies falling, refuses X-ray at this time. She will send a My-Chart message if she decides to have the X-ray, she verbalizes understanding. She rates her pain 8. Her current exercise regime is walking short distances  and performing stretching exercises.  [name] Morphine equivalent is 60.00 MME.   Last Oral Swab was Performed on 08/10/2023, it was consistent.     Pain Inventory Average Pain 8 Pain Right Now 8 My pain is constant, sharp, burning, dull, stabbing, tingling, and aching  In the last 24 hours, has pain interfered with the following? General activity 5 Relation with others 5 Enjoyment of life 5 What TIME of day is your pain at its worst? morning , daytime, evening, and night Sleep (in general) Poor  Pain is worse with: walking, bending, sitting, inactivity, standing, and some activites Pain improves with: rest, heat/ice, pacing activities, and medication Relief from Meds: 6  Family History  Problem Relation Age of Onset   Cancer Mother        lung   Hypertension Mother    Cancer Father        colon   Hypertension Sister    Cancer Sister    Cancer Brother    Hyperlipidemia Sister    Birth defects Maternal Grandmother        breast   Breast cancer Maternal Grandmother    Birth defects Paternal Grandmother        uterine, stomach, lung   Breast cancer Paternal Grandmother    Breast cancer Paternal Aunt    Social History   Socioeconomic History   Marital status: Married    Spouse name: Not on file   Number of children: 2   Years of education: Not on file   Highest  education level: Not on file  Occupational History   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  Tobacco Use   Smoking status: Never    Passive exposure: Current   Smokeless tobacco: Never  Vaping Use   Vaping status: Never Used  Substance and Sexual Activity   Alcohol use: Not Currently    Alcohol/week: 0.0 standard drinks of alcohol    Comment: 01/24/2013 "drink or 2 once or /twice/year, if that"   Drug use: No   Sexual activity: Not Currently  Other Topics Concern   Not on file  Social History Narrative   No living will   Would want husband as POA--then sister, [name]   Would accept resuscitation attempts   Probably would not want tube feeds if cognitively unaware   Social Drivers of Health   Financial Resource Strain: Not on file  Food Insecurity: Not on file  Transportation Needs: Not on file  Physical Activity: Not on file  Stress: Not on file  Social Connections: Not on file   Past Surgical History:  Procedure Laterality Date   APPENDECTOMY  ~ 06/2007   BLADDER REPAIR  ~ 06/2007   "same day after bladder lift" (01/24/2013)   BLADDER SUSPENSION  ~ 06/2007   BREAST BIOPSY Left    DIAGNOSTIC LAPAROSCOPY  1990's & ~ 2000   "  I've had a couple; for endometrosis" (01/24/2013)   HERNIA REPAIR     INCISIONAL HERNIA REPAIR N/A 01/24/2013   Procedure: LAPAROSCOPIC INCISIONAL HERNIA;  Surgeon: Shelly Rubenstein, MD;  Location: [facility];  Service: General;  Laterality: N/A;   INSERTION OF MESH N/A 01/24/2013   Procedure: INSERTION OF MESH;  Surgeon: Shelly Rubenstein, MD;  Location: [facility];  Service: General;  Laterality: N/A;   LAPAROSCOPIC INCISIONAL / UMBILICAL / VENTRAL HERNIA REPAIR  01/24/2013   IHR w/mesh/notes   NASAL SEPTUM SURGERY  1980's?   OOPHORECTOMY Right 2009   TONSILLECTOMY  1990's   VAGINAL HYSTERECTOMY  ` 06/2007   Past Surgical History:  Procedure Laterality Date   APPENDECTOMY  ~ 06/2007   BLADDER REPAIR  ~ 06/2007   "same day after bladder lift" (01/24/2013)   BLADDER  SUSPENSION  ~ 06/2007   BREAST BIOPSY Left    DIAGNOSTIC LAPAROSCOPY  1990's & ~ 2000   "I've had a couple; for endometrosis" (01/24/2013)   HERNIA REPAIR     INCISIONAL HERNIA REPAIR N/A 01/24/2013   Procedure: LAPAROSCOPIC INCISIONAL HERNIA;  Surgeon: Shelly Rubenstein, MD;  Location: [facility];  Service: General;  Laterality: N/A;   INSERTION OF MESH N/A 01/24/2013   Procedure: INSERTION OF MESH;  Surgeon: Shelly Rubenstein, MD;  Location: [facility];  Service: General;  Laterality: N/A;   LAPAROSCOPIC INCISIONAL / UMBILICAL / VENTRAL HERNIA REPAIR  01/24/2013   IHR w/mesh/notes   NASAL SEPTUM SURGERY  1980's?   OOPHORECTOMY Right 2009   TONSILLECTOMY  1990's   VAGINAL HYSTERECTOMY  ` 06/2007   Past Medical History:  Diagnosis Date   Allergy    Anxiety    Arthritis    "just the norm" (01/24/2013)   ASCVD (arteriosclerotic cardiovascular disease)    Asthma    Celiac disease    Daily headache    "depends on the season" (01/24/2013)   Diabetes mellitus without complication (HCC)    Fibromyalgia    GERD (gastroesophageal reflux disease)    History of colonic polyps    Hyperlipidemia    Hypertension    Migraines    Obesity (BMI 30.0-34.9)    RSD (reflex sympathetic dystrophy)    Sleep apnea    NO MACHINE RECOMMENDED   TIA (transient ischemic attack) ~ 2009   BP 118/71   Pulse 95   Ht 5\' 7"  (1.702 m)   SpO2 98%   BMI 27.57 kg/m   Opioid Risk Score:   Fall Risk Score:  `1  Depression screen PHQ 2/9     11/08/2023    1:29 PM 10/10/2023    1:14 PM 07/08/2023    1:40 PM 06/27/2023   12:18 PM 06/27/2023   11:45 AM 06/08/2023    1:10 PM 06/08/2023    1:01 PM  Depression screen PHQ 2/9  Decreased Interest 1 1 0 0 1 1 0  Down, Depressed, Hopeless 1 1 0 1 1 1  0  PHQ - 2 Score 2 2 0 1 2 2  0  Altered sleeping     1    Tired, decreased energy     1    Change in appetite     0    Feeling bad or failure about yourself      2    Trouble concentrating     0    Moving slowly or fidgety/restless      1    Suicidal thoughts  0    PHQ-9 Score     7    Difficult doing work/chores     Very difficult       Review of Systems  Musculoskeletal:        Left arm and hand  All other systems reviewed and are negative.      Objective:   Physical Exam Vitals and nursing note reviewed.  Constitutional:      Appearance: Normal appearance.  Neck:     Comments: Cervical Paraspinal Tenderness: C-4-C-6 Cardiovascular:     Rate and Rhythm: Normal rate and regular rhythm.     Pulses: Normal pulses.     Heart sounds: Normal heart sounds.  Pulmonary:     Effort: Pulmonary effort is normal.     Breath sounds: Normal breath sounds.  Musculoskeletal:     Comments: Normal Muscle Bulk and Muscle Testing Reveals:  Upper Extremities: Right: Full ROM and Muscle Strength 5/5 Left Upper Extremity: Decreased ROM 45 Degrees  and Muscle Strength 2/5  Thoracic Paraspinal Tenderness: T-7-T-9 Lumbar Paraspinal Tenderness: L-3-L-5 Lower Extremities: Decreased ROM and Muscle Strength 4/5 Bilateral Lower Extremities Flexion Produces Pain into her Bilateral Lower Extremities and Bilateral Feet Arises from Table slowly Antalgic  Gait     Skin:    General: Skin is warm and dry.  Neurological:     Mental Status: She is alert and oriented to person, place, and time.  Psychiatric:        Mood and Affect: Mood normal.        Behavior: Behavior normal.         Assessment & Plan:  1.Cervicalgia/ Cervical Radiculitis:  Continue to Monitor. 11/08/2023 2.Complex Regional Pain Syndrome Type 1: Continue Topamax and Cymbalta. 11/08/2023. Refilled: Hydro-codone 5/325mg  two tablets twice a day as needed for pain #120 and  Tramadol 100 mg ER daily #30 and Tramadol 50 mg one tablet by mouth BID. One tablet in the afternoon and one tablet in the evening. We will continue the opioid monitoring program, this consists of regular clinic visits, examinations, urine drug screen, pill counts as well as use of [place] Controlled Substance Reporting system. A 12 month History has been reviewed on the [place] Controlled Substance Reporting System  On 11/08/2023. 3. Depression: with Physical Illness:  Continue current medication regimen with Cymbalta.11/08/2023 4. Insomnia: Continue current medication regimen with  Trazodone one and half tablet at bedtime  11/08/2023 5. Muscle Spasms: Continue current medication regimen with Tizanidine. 01/21 /2025 6. Chronic Pain Syndrome: Continue Ibuprofen BID as needed. 11/08/2023 7. Migraine without Aura: Continue Topamax: Continue to Monitor. 11/08/2023 8. Dysphonia: She reports she was seen by ENT. S PCP Following. Continue to monitor.  11/08/2023 9. Trigeminal Neuralgia: [name] reports her dentist Dr Hilda Blades diagnose her with Trigeminal Neuralgia. Referral was placed for Neurology. She has a scheduled appointment with [facility]. 11/08/2023   F/U in 1 month

## 2023-11-23 DIAGNOSIS — K219 Gastro-esophageal reflux disease without esophagitis: Secondary | ICD-10-CM | POA: Diagnosis not present

## 2023-11-23 DIAGNOSIS — Z860100 Personal history of colon polyps, unspecified: Secondary | ICD-10-CM | POA: Diagnosis not present

## 2023-11-23 DIAGNOSIS — R599 Enlarged lymph nodes, unspecified: Secondary | ICD-10-CM | POA: Diagnosis not present

## 2023-11-29 ENCOUNTER — Ambulatory Visit: Payer: Medicare HMO | Admitting: Diagnostic Neuroimaging

## 2023-11-29 ENCOUNTER — Encounter: Payer: Self-pay | Admitting: Diagnostic Neuroimaging

## 2023-11-29 ENCOUNTER — Other Ambulatory Visit: Payer: Self-pay | Admitting: Diagnostic Neuroimaging

## 2023-11-29 ENCOUNTER — Telehealth: Payer: Self-pay | Admitting: Diagnostic Neuroimaging

## 2023-11-29 VITALS — BP 113/72 | HR 83 | Ht 67.0 in | Wt 172.0 lb

## 2023-11-29 DIAGNOSIS — G4451 Hemicrania continua: Secondary | ICD-10-CM

## 2023-11-29 DIAGNOSIS — K029 Dental caries, unspecified: Secondary | ICD-10-CM

## 2023-11-29 DIAGNOSIS — M26621 Arthralgia of right temporomandibular joint: Secondary | ICD-10-CM

## 2023-11-29 MED ORDER — INDOMETHACIN 25 MG PO CAPS: 25.0000 mg | ORAL_CAPSULE | Freq: Two times a day (BID) | ORAL | 0 refills | Status: DC

## 2023-11-29 NOTE — Progress Notes (Signed)
[facility]  PATIENT: [name] DOB: 1966/04/08  REFERRING CLINICIAN: Jones Bales, NP HISTORY FROM: patient  REASON FOR VISIT: new consult   HISTORICAL  CHIEF COMPLAINT:  Chief Complaint  Patient presents with   New Patient (Initial Visit)    Pt in 6 with daughter Pt here for trigeminal neuralgia Pt states pain on left side of face Pt states pain does affect her eating and talking Pt states severe headache since August 2024      HISTORY OF PRESENT ILLNESS:   58 year old female here for evaluation of left sided headache and pain.  August 2024 patient had left mandibular crown replacement due to concern of underlying caries.  Patient did not have pain prior to this procedure.  Within a few days she started to develop pain in the left side of her head, face, eye, ear, throat.  Left tongue felt a burning sensation.  Initially symptoms were quite severe and consistent.  She had left eye watering and left nasal drainage as well.  Symptoms have been consistent since August and unremitting.  In the last few weeks symptoms have slightly improved.   REVIEW OF SYSTEMS: Full 14 system review of systems performed and negative with exception of: as per HPI.  ALLERGIES: Allergies  Allergen Reactions   Meperidine Anaphylaxis, Itching and Swelling    REACTION: swelling   Cymbalta [Duloxetine Hcl] Hives    Generic makes her feel like she is crawling out of her skin   Diazepam Hives    REACTION: swelling   Other     Axe Cologne   Shellfish Allergy Hives    HOME MEDICATIONS: Outpatient Medications Prior to Visit  Medication Sig Dispense Refill   Accu-Chek Softclix Lancets lancets Use as instructed 100 each 5   albuterol (VENTOLIN HFA) 108 (90 Base) MCG/ACT inhaler Inhale 2 puffs into the lungs every 6 (six) hours as needed for wheezing or shortness of breath. 18 g 1   budesonide-formoterol (SYMBICORT) 160-4.5 MCG/ACT inhaler Inhale 2 puffs into the  lungs daily. 1 each 12   CYMBALTA 60 MG capsule Take 60 mg by mouth daily.     DULoxetine (CYMBALTA) 30 MG capsule Take 1 capsule (30 mg total) by mouth daily. 90 capsule 3   empagliflozin (JARDIANCE) 10 MG TABS tablet Take 1 tablet (10 mg total) by mouth daily before breakfast. 30 tablet 11   esomeprazole (NEXIUM) 40 MG capsule Take 40 mg by mouth daily.     fluconazole (DIFLUCAN) 150 MG tablet Take 1 tablet (150 mg total) by mouth 2 (two) times a week. 8 tablet 3   fluticasone (FLONASE) 50 MCG/ACT nasal spray Place 2 sprays into both nostrils daily. 16 g 6   gabapentin (NEURONTIN) 300 MG capsule Take 1 capsule (300 mg total) by mouth 2 (two) times daily. And 2 capsules at bedtime 120 capsule 5   glucose blood (ACCU-CHEK GUIDE) test strip See admin instructions.     HYDROcodone-acetaminophen (NORCO/VICODIN) 5-325 MG tablet Take 2 tablets by mouth 2 (two) times daily as needed for moderate pain (pain score 4-6). 120 tablet 0   ibuprofen (ADVIL) 600 MG tablet Take 1 tablet (600 mg total) by mouth every 8 (eight) hours as needed for mild pain (pain score 1-3). 90 tablet 5   Lancets MISC      loratadine (CLARITIN) 10 MG tablet Take 10 mg by mouth daily.     metFORMIN (GLUCOPHAGE-XR) 500 MG 24 hr tablet TAKE 2 TABLETS BY MOUTH EVERY DAY  WITH BREAKFAST 180 tablet 3   montelukast (SINGULAIR) 10 MG tablet TAKE 1 TABLET BY MOUTH EVERYDAY AT BEDTIME 90 tablet 3   olmesartan (BENICAR) 40 MG tablet TAKE 1 TABLET BY MOUTH EVERY DAY 90 tablet 3   omeprazole (PRILOSEC) 40 MG capsule Take 40 mg by mouth 2 (two) times daily.     rosuvastatin (CRESTOR) 5 MG tablet TAKE 1 TABLET (5 MG TOTAL) BY MOUTH DAILY. 90 tablet 3   tiZANidine (ZANAFLEX) 4 MG tablet Take 1 tablet (4 mg total) by mouth every 6 (six) hours as needed for muscle spasms. 120 tablet 3   topiramate (TOPAMAX) 100 MG tablet TAKE 1 TABLET BY MOUTH EVERY MORNING AND TAKE 2 TABLETS BY MOUTH EVERY EVENING 270 tablet 1   traMADol (ULTRAM) 50 MG tablet TAKE  1 TABLET BY MOUTH TWICE A DAY. DIRECTION CHANGE. 60 tablet 2   traMADol (ULTRAM-ER) 100 MG 24 hr tablet Take 1 tablet (100 mg total) by mouth daily. 30 tablet 2   traZODone (DESYREL) 50 MG tablet TAKE 1 AND 1/2 TABLETS BY MOUTH AT BEDTIME 135 tablet 3   No facility-administered medications prior to visit.    PAST MEDICAL HISTORY: Past Medical History:  Diagnosis Date   Allergy    Anxiety    Arthritis    "just the norm" (01/24/2013)   ASCVD (arteriosclerotic cardiovascular disease)    Asthma    Celiac disease    Daily headache    "depends on the season" (01/24/2013)   Diabetes mellitus without complication (HCC)    Fibromyalgia    GERD (gastroesophageal reflux disease)    History of colonic polyps    Hyperlipidemia    Hypertension    Migraines    Obesity (BMI 30.0-34.9)    RSD (reflex sympathetic dystrophy)    Sleep apnea    NO MACHINE RECOMMENDED   TIA (transient ischemic attack) ~ 2009    PAST SURGICAL HISTORY: Past Surgical History:  Procedure Laterality Date   APPENDECTOMY  ~ 06/2007   BLADDER REPAIR  ~ 06/2007   "same day after bladder lift" (01/24/2013)   BLADDER SUSPENSION  ~ 06/2007   BREAST BIOPSY Left    DIAGNOSTIC LAPAROSCOPY  1990's & ~ 2000   "I've had a couple; for endometrosis" (01/24/2013)   HERNIA REPAIR     INCISIONAL HERNIA REPAIR N/A 01/24/2013   Procedure: LAPAROSCOPIC INCISIONAL HERNIA;  Surgeon: Shelly Rubenstein, MD;  Location: [facility];  Service: General;  Laterality: N/A;   INSERTION OF MESH N/A 01/24/2013   Procedure: INSERTION OF MESH;  Surgeon: Shelly Rubenstein, MD;  Location: [facility];  Service: General;  Laterality: N/A;   LAPAROSCOPIC INCISIONAL / UMBILICAL / VENTRAL HERNIA REPAIR  01/24/2013   IHR w/mesh/notes   NASAL SEPTUM SURGERY  1980's?   OOPHORECTOMY Right 2009   TONSILLECTOMY  1990's   VAGINAL HYSTERECTOMY  ` 06/2007    FAMILY HISTORY: Family History  Problem Relation Age of Onset   Cancer Mother        lung   Hypertension Mother     Cancer Father        colon   Hypertension Sister    Cancer Sister    Cancer Brother    Hyperlipidemia Sister    Birth defects Maternal Grandmother        breast   Breast cancer Maternal Grandmother    Birth defects Paternal Grandmother        uterine, stomach, lung   Breast cancer Paternal Grandmother  Breast cancer Paternal Aunt     SOCIAL HISTORY: Social History   Socioeconomic History   Marital status: Married    Spouse name: Not on file   Number of children: 2   Years of education: Not on file   Highest education level: Not on file  Occupational History   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  Tobacco Use   Smoking status: Never    Passive exposure: Current   Smokeless tobacco: Never  Vaping Use   Vaping status: Never Used  Substance and Sexual Activity   Alcohol use: Not Currently    Alcohol/week: 0.0 standard drinks of alcohol    Comment: 01/24/2013 "drink or 2 once or /twice/year, if that"   Drug use: No   Sexual activity: Not Currently  Other Topics Concern   Not on file  Social History Narrative   No living will   Would want husband as POA--then sister, [name]   Would accept resuscitation attempts   Probably would not want tube feeds if cognitively unaware   Pt lives with family    Pt doesn't work    Social Drivers of Corporate investment banker Strain: Not on Ship broker Insecurity: Not on file  Transportation Needs: Not on file  Physical Activity: Not on file  Stress: Not on file  Social Connections: Not on file  Intimate Partner Violence: Not on file     PHYSICAL EXAM  GENERAL EXAM/CONSTITUTIONAL: Vitals:  Vitals:   11/29/23 1105  BP: 113/72  Pulse: 83  Weight: 172 lb (78 kg)  Height: 5\' 7"  (1.702 m)   Body mass index is 26.94 kg/m. Wt Readings from Last 3 Encounters:  11/29/23 172 lb (78 kg)  10/13/23 176 lb (79.8 kg)  10/10/23 175 lb (79.4 kg)   Patient is in no distress; well developed, nourished and groomed; neck is  supple  CARDIOVASCULAR: Examination of carotid arteries is normal; no carotid bruits Regular rate and rhythm, no murmurs Examination of peripheral vascular system by observation and palpation is normal  EYES: Ophthalmoscopic exam of optic discs and posterior segments is normal; no papilledema or hemorrhages No results found.  MUSCULOSKELETAL: Gait, strength, tone, movements noted in Neurologic exam below  NEUROLOGIC: MENTAL STATUS:      No data to display         awake, alert, oriented to person, place and time recent and remote memory intact normal attention and concentration language fluent, comprehension intact, naming intact fund of knowledge appropriate  CRANIAL NERVE:  2nd - no papilledema on fundoscopic exam 2nd, 3rd, 4th, 6th - pupils equal and reactive to light, visual fields full to confrontation, extraocular muscles intact, no nystagmus 5th - facial sensation symmetric 7th - facial strength symmetric 8th - hearing intact 9th - palate elevates symmetrically, uvula midline 11th - shoulder shrug symmetric 12th - tongue protrusion midline  MOTOR:  normal bulk and tone, full strength in the BUE, BLE  SENSORY:  normal and symmetric to light touch, temperature, vibration  COORDINATION:  finger-nose-finger, fine finger movements normal  REFLEXES:  deep tendon reflexes TRACE and symmetric  GAIT/STATION:  narrow based gait     DIAGNOSTIC DATA (LABS, IMAGING, TESTING) - I reviewed patient records, labs, notes, testing and imaging myself where available.  Lab Results  Component Value Date   WBC 12.7 (H) 06/27/2023   HGB 13.2 06/27/2023   HCT 42.9 06/27/2023   MCV 87.9 06/27/2023   PLT 299.0 06/27/2023      Component Value  Date/Time   NA 139 06/27/2023 1302   K 4.2 06/27/2023 1302   CL 104 06/27/2023 1302   CO2 26 06/27/2023 1302   GLUCOSE 160 (H) 06/27/2023 1302   BUN 20 06/27/2023 1302   CREATININE 0.99 06/27/2023 1302   CREATININE 1.00  07/01/2017 1132   CALCIUM 9.3 06/27/2023 1302   PROT 6.9 06/27/2023 1302   ALBUMIN 4.4 06/27/2023 1302   AST 14 06/27/2023 1302   ALT 14 06/27/2023 1302   ALKPHOS 98 06/27/2023 1302   BILITOT 0.4 06/27/2023 1302   GFRNONAA >60 02/09/2021 1618   GFRAA >90 01/16/2013 1039   Lab Results  Component Value Date   CHOL 196 06/27/2023   HDL 40.50 06/27/2023   LDLCALC 92 02/28/2009   LDLDIRECT 116.0 06/27/2023   TRIG (H) 06/27/2023    446.0 Triglyceride is over 400; calculations on Lipids are invalid.   CHOLHDL 5 06/27/2023   Lab Results  Component Value Date   HGBA1C 7.0 (A) 08/29/2023   Lab Results  Component Value Date   VITAMINB12 183 (L) 06/27/2023   Lab Results  Component Value Date   TSH 1.87 06/27/2023      ASSESSMENT AND PLAN  58 y.o. year old female here with new onset constant left-sided headache, pain, with automatic features including lacrimation and nasal drainage, since August 2024.   Dx:  1. Hemicrania continua   2. 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    Dispense:  60 capsule    Refill:  0   Orders Placed This Encounter  Procedures   MR BRAIN W WO CONTRAST   CT MAXILLOFACIAL W & WO CONTRAST   Return in about 3 months (around 02/26/2024) for MyChart visit (15 min).    Suanne Marker, MD  11/29/2023, 11:45 AM Certified in Neurology, Neurophysiology and Neuroimaging  [facility] [address] [place], [place] [address] [phone]

## 2023-11-29 NOTE — Telephone Encounter (Signed)
sent to GI they obtain [employer]. [phone]

## 2023-11-29 NOTE — Patient Instructions (Signed)
  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

## 2023-11-30 ENCOUNTER — Other Ambulatory Visit: Payer: Self-pay | Admitting: Diagnostic Neuroimaging

## 2023-11-30 ENCOUNTER — Telehealth: Payer: Self-pay | Admitting: *Deleted

## 2023-11-30 NOTE — Telephone Encounter (Signed)
Encounter send to PA team

## 2023-11-30 NOTE — Telephone Encounter (Signed)
Message sent to PA team.

## 2023-12-02 ENCOUNTER — Other Ambulatory Visit (HOSPITAL_COMMUNITY): Payer: Self-pay

## 2023-12-05 ENCOUNTER — Encounter: Payer: Self-pay | Admitting: Cardiology

## 2023-12-05 ENCOUNTER — Ambulatory Visit: Payer: Medicare HMO | Attending: Cardiology | Admitting: Cardiology

## 2023-12-05 ENCOUNTER — Encounter: Payer: Self-pay | Admitting: Diagnostic Neuroimaging

## 2023-12-05 VITALS — BP 134/90 | HR 93 | Ht 67.0 in | Wt 170.0 lb

## 2023-12-05 DIAGNOSIS — R072 Precordial pain: Secondary | ICD-10-CM | POA: Diagnosis not present

## 2023-12-05 DIAGNOSIS — I10 Essential (primary) hypertension: Secondary | ICD-10-CM

## 2023-12-05 DIAGNOSIS — E782 Mixed hyperlipidemia: Secondary | ICD-10-CM

## 2023-12-05 MED ORDER — ROSUVASTATIN CALCIUM 20 MG PO TABS: 20.0000 mg | ORAL_TABLET | Freq: Every day | ORAL | 0 refills | Status: DC

## 2023-12-05 MED ORDER — METOPROLOL TARTRATE 100 MG PO TABS
100.0000 mg | ORAL_TABLET | Freq: Once | ORAL | 0 refills | Status: DC
Start: 2023-12-05 — End: 2024-01-30

## 2023-12-05 MED ORDER — CORLANOR 5 MG PO TABS: 15.0000 mg | ORAL_TABLET | Freq: Once | ORAL | 0 refills | Status: AC

## 2023-12-05 NOTE — Telephone Encounter (Signed)
GI said that they can't do the CT maxillofacial W/WO, is has to be either just with or without contrast.

## 2023-12-05 NOTE — Patient Instructions (Signed)
Medication Instructions:   INCREASE Rosuvastatin - Take one tablet ( 20mg ) by mouth daily.    *If you need a refill on your cardiac medications before your next appointment, please call your pharmacy*   Lab Work:  Your physician recommends you have labs today - BMP   If you have labs (blood work) drawn today and your tests are completely normal, you will receive your results only by: MyChart Message (if you have MyChart) OR A paper copy in the mail If you have any lab test that is abnormal or we need to change your treatment, we will call you to review the results.   Testing/Procedures:  Your physician has requested that you have an echocardiogram. Echocardiography is a painless test that uses sound waves to create images of your heart. It provides your doctor with information about the size and shape of your heart and how well your heart's chambers and valves are working. This procedure takes approximately one hour. There are no restrictions for this procedure. Please do NOT wear cologne, perfume, aftershave, or lotions (deodorant is allowed). Please arrive 15 minutes prior to your appointment time.  Please note: We ask at that you not bring children with you during ultrasound (echo/ vascular) testing. Due to room size and safety concerns, children are not allowed in the ultrasound rooms during exams. Our front office staff cannot provide observation of children in our lobby area while testing is being conducted. An adult accompanying a patient to their appointment will only be allowed in the ultrasound room at the discretion of the ultrasound technician under special circumstances. We apologize for any inconvenience.  2. Your physician has requested that you have a carotid duplex. This test is an ultrasound of the carotid arteries in your neck. It looks at blood flow through these arteries that supply the brain with blood. Allow one hour for this exam. There are no restrictions or special  instructions.     Your cardiac CT will be scheduled at:   [facility] [address] [place], [place] [address] [phone]  If scheduled at [facility], please arrive 15 mins early for check-in and test prep.  There is spacious parking and easy access to the radiology department from the [facility] Heart and Vascular entrance. Please enter here and check-in with the desk attendant.   Please follow these instructions carefully (unless otherwise directed):  An IV will be required for this test and Nitroglycerin will be given.  Hold all erectile dysfunction medications at least 3 days (72 hrs) prior to test. (Ie viagra, cialis, sildenafil, tadalafil, etc)   On the Night Before the Test: Be sure to Drink plenty of water. Do not consume any caffeinated/decaffeinated beverages or chocolate 12 hours prior to your test. Do not take any antihistamines 12 hours prior to your test.  On the Day of the Test: Drink plenty of water until 1 hour prior to the test. Do not eat any food 1 hour prior to test. HOLD METFORMIN THE DAY OF YOUR CARDIAC CT AND 48 HOURS AFTER Take metoprolol (Lopressor) two hours prior to test. Take Corlanor two hours prior to test If you take Furosemide/Hydrochlorothiazide/Spironolactone/Chlorthalidone, please HOLD on the morning of the test. Patients who wear a continuous glucose monitor MUST remove the device prior to scanning. FEMALES- please wear underwire-free bra if available, avoid dresses & tight clothing  After the Test: Drink plenty of water. After receiving IV contrast, you may experience a mild flushed feeling.  This is normal. On occasion, you may experience a mild rash up to 24 hours after the test. This is not dangerous. If this occurs, you can take Benadryl 25 mg, Zyrtec, Claritin, or Allegra and increase your fluid intake. (Patients taking Tikosyn should avoid Benadryl, and may take Zyrtec, Claritin, or  Allegra) If you experience trouble breathing, this can be serious. If it is severe call 911 IMMEDIATELY. If it is mild, please call our office.  We will call to schedule your test 2-4 weeks out understanding that some insurance companies will need an authorization prior to the service being performed.   For more information and frequently asked questions, please visit our website : [email or web address]  For non-scheduling related questions, please contact the cardiac imaging nurse navigator should you have any questions/concerns: Cardiac Imaging Nurse Navigators Direct Office Dial: [phone]   For scheduling needs, including cancellations and rescheduling, please call [place], [phone].\ Follow-Up: At [facility], you and your health needs are our priority.  As part of our continuing mission to provide you with exceptional heart care, we have created designated Provider Care Teams.  These Care Teams include your primary Cardiologist (physician) and Advanced Practice Providers (APPs -  Physician Assistants and Nurse Practitioners) who all work together to provide you with the care you need, when you need it.  We recommend signing up for the patient portal called "MyChart".  Sign up information is provided on this After Visit Summary.  MyChart is used to connect with patients for Virtual Visits (Telemedicine).  Patients are able to view lab/test results, encounter notes, upcoming appointments, etc.  Non-urgent messages can be sent to your provider as well.   To learn more about what you can do with MyChart, go to [email or web address].    Your next appointment:   8 - 10  week(s)  Provider:   You may see Debbe Odea, MD or one of the following Advanced Practice Providers on your designated Care Team:   Nicolasa Ducking, NP Eula Listen, PA-C Cadence Fransico Michael, PA-C Charlsie Quest, NP Carlos Levering, NP

## 2023-12-05 NOTE — Addendum Note (Signed)
Addended by: Judi Cong on: 12/05/2023 07:36 AM   Modules accepted: Orders

## 2023-12-05 NOTE — Progress Notes (Signed)
Cardiology Office Note:    Date:  12/05/2023   ID:  [name], DOB Nov 05, 1965, MRN [id number]  PCP:  Karie Schwalbe, MD   [facility] Providers Cardiologist:  Debbe Odea, MD     Referring MD: Karie Schwalbe, MD   Chief Complaint  Patient presents with   New Patient (Initial Visit)    Referred for cardiac evaluation of Chest heaviness.  Reports previous cardiac evaluation 17-18 years ago at a location in [place].  Chest heaviness for past several months.  Thought it may be related to Gabapentin so stopped 1 week ago and heaviness still there but not as frequent.      History of Present Illness:    [name] is a 58 y.o. female with a hx of hypertension, hyperlipidemia, asthma, presenting with chest heaviness.  Endorses chest heaviness ongoing over the past 6 months.  Also endorses dyspnea on exertion.  Denies immediate family history of heart disease, states paternal grandparents and uncles had heart attacks.  Compliant with medications as prescribed.  Initially attributed chest discomfort to gabapentin.  She stopped gabapentin over the past 1 to 2 weeks with slight improvement in symptoms, although still present.  Past Medical History:  Diagnosis Date   Allergy    Anxiety    Arthritis    "just the norm" (01/24/2013)   ASCVD (arteriosclerotic cardiovascular disease)    Asthma    Celiac disease    Daily headache    "depends on the season" (01/24/2013)   Diabetes mellitus without complication (HCC)    Fibromyalgia    GERD (gastroesophageal reflux disease)    History of colonic polyps    Hyperlipidemia    Hypertension    Migraines    Obesity (BMI 30.0-34.9)    RSD (reflex sympathetic dystrophy)    Sleep apnea    NO MACHINE RECOMMENDED   TIA (transient ischemic attack) ~ 2009    Past Surgical History:  Procedure Laterality Date   APPENDECTOMY  ~ 06/2007   BLADDER REPAIR  ~ 06/2007   "same day after bladder lift" (01/24/2013)    BLADDER SUSPENSION  ~ 06/2007   BREAST BIOPSY Left    DIAGNOSTIC LAPAROSCOPY  1990's & ~ 2000   "I've had a couple; for endometrosis" (01/24/2013)   HERNIA REPAIR     INCISIONAL HERNIA REPAIR N/A 01/24/2013   Procedure: LAPAROSCOPIC INCISIONAL HERNIA;  Surgeon: Shelly Rubenstein, MD;  Location: [facility] OR;  Service: General;  Laterality: N/A;   INSERTION OF MESH N/A 01/24/2013   Procedure: INSERTION OF MESH;  Surgeon: Shelly Rubenstein, MD;  Location: [facility] OR;  Service: General;  Laterality: N/A;   LAPAROSCOPIC INCISIONAL / UMBILICAL / VENTRAL HERNIA REPAIR  01/24/2013   IHR w/mesh/notes   NASAL SEPTUM SURGERY  1980's?   OOPHORECTOMY Right 2009   TONSILLECTOMY  1990's   VAGINAL HYSTERECTOMY  ` 06/2007    Current Medications: Current Meds  Medication Sig   Accu-Chek Softclix Lancets lancets Use as instructed   albuterol (VENTOLIN HFA) 108 (90 Base) MCG/ACT inhaler Inhale 2 puffs into the lungs every 6 (six) hours as needed for wheezing or shortness of breath.   budesonide-formoterol (SYMBICORT) 160-4.5 MCG/ACT inhaler Inhale 2 puffs into the lungs daily.   CORLANOR 5 MG TABS tablet Take 3 tablets (15 mg total) by mouth once for 1 dose. TAKE TWO HOURS PRIOR TO CARDIAC CT   CYMBALTA 60 MG capsule Take 60 mg by mouth daily.   DULoxetine (CYMBALTA) 30 MG  capsule Take 1 capsule (30 mg total) by mouth daily.   empagliflozin (JARDIANCE) 10 MG TABS tablet Take 1 tablet (10 mg total) by mouth daily before breakfast.   esomeprazole (NEXIUM) 40 MG capsule Take 40 mg by mouth daily.   fluconazole (DIFLUCAN) 150 MG tablet Take 1 tablet (150 mg total) by mouth 2 (two) times a week.   fluticasone (FLONASE) 50 MCG/ACT nasal spray Place 2 sprays into both nostrils daily.   HYDROcodone-acetaminophen (NORCO/VICODIN) 5-325 MG tablet Take 2 tablets by mouth 2 (two) times daily as needed for moderate pain (pain score 4-6).   ibuprofen (ADVIL) 600 MG tablet Take 1 tablet (600 mg total) by mouth every 8 (eight) hours as  needed for mild pain (pain score 1-3).   indomethacin (INDOCIN) 25 MG capsule Take 1 capsule (25 mg total) by mouth 2 (two) times daily with a meal.   loratadine (CLARITIN) 10 MG tablet Take 10 mg by mouth daily.   metFORMIN (GLUCOPHAGE-XR) 500 MG 24 hr tablet TAKE 2 TABLETS BY MOUTH EVERY DAY WITH BREAKFAST   metoprolol tartrate (LOPRESSOR) 100 MG tablet Take 1 tablet (100 mg total) by mouth once for 1 dose. TAKE TWO HOURS PRIOR TO CARDIAC CT   montelukast (SINGULAIR) 10 MG tablet TAKE 1 TABLET BY MOUTH EVERYDAY AT BEDTIME   olmesartan (BENICAR) 40 MG tablet TAKE 1 TABLET BY MOUTH EVERY DAY   omeprazole (PRILOSEC) 40 MG capsule Take 40 mg by mouth 2 (two) times daily.   tiZANidine (ZANAFLEX) 4 MG tablet Take 1 tablet (4 mg total) by mouth every 6 (six) hours as needed for muscle spasms.   topiramate (TOPAMAX) 100 MG tablet TAKE 1 TABLET BY MOUTH EVERY MORNING AND TAKE 2 TABLETS BY MOUTH EVERY EVENING   traMADol (ULTRAM) 50 MG tablet TAKE 1 TABLET BY MOUTH TWICE A DAY. DIRECTION CHANGE.   traMADol (ULTRAM-ER) 100 MG 24 hr tablet Take 1 tablet (100 mg total) by mouth daily.   traZODone (DESYREL) 50 MG tablet TAKE 1 AND 1/2 TABLETS BY MOUTH AT BEDTIME   [DISCONTINUED] rosuvastatin (CRESTOR) 5 MG tablet TAKE 1 TABLET (5 MG TOTAL) BY MOUTH DAILY.     Allergies:   Meperidine, Cymbalta [duloxetine hcl], Diazepam, Other, and Shellfish allergy   Social History   Socioeconomic History   Marital status: Married    Spouse name: Not on file   Number of children: 2   Years of education: Not on file   Highest education level: Not on file  Occupational History   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  Tobacco Use   Smoking status: Never    Passive exposure: Current   Smokeless tobacco: Never  Vaping Use   Vaping status: Never Used  Substance and Sexual Activity   Alcohol use: Not Currently    Comment: 01/24/2013 "drink or 2 once or /twice/year, if that"   Drug use: No   Sexual activity: Yes    Birth  control/protection: None  Other Topics Concern   Not on file  Social History Narrative   No living will   Would want husband as POA--then sister, [name]   Would accept resuscitation attempts   Probably would not want tube feeds if cognitively unaware   Pt lives with family    Pt doesn't work    Social Drivers of Corporate investment banker Strain: Not on [employer] Insecurity: Not on file  Transportation Needs: Not on file  Physical Activity: Not on file  Stress: Not on file  Social Connections: Not on file     Family History: The patient's family history includes Birth defects in her maternal grandmother and paternal grandmother; Breast cancer in her maternal grandmother, paternal aunt, and paternal grandmother; Cancer in her brother, father, mother, and sister; Heart attack in her paternal grandfather; Heart failure in her paternal grandfather; Hyperlipidemia in her sister; Hypertension in her mother and sister.  ROS:   Please see the history of present illness.     All other systems reviewed and are negative.  EKGs/Labs/Other Studies Reviewed:    The following studies were reviewed today:  EKG Interpretation Date/Time:  Monday December 05 2023 14:10:06 EST Ventricular Rate:  93 PR Interval:  168 QRS Duration:  96 QT Interval:  366 QTC Calculation: 455 R Axis:   29  Text Interpretation: Normal sinus rhythm Incomplete right bundle branch block ST & T wave abnormality, consider anterior ischemia Confirmed by Debbe Odea ([id number]) on 12/05/2023 2:11:43 PM    Recent Labs: 06/27/2023: ALT 14; BUN 20; Creatinine, Ser 0.99; Hemoglobin 13.2; Platelets 299.0; Potassium 4.2; Sodium 139; TSH 1.87  Recent Lipid Panel    Component Value Date/Time   CHOL 196 06/27/2023 1302   TRIG (H) 06/27/2023 1302    446.0 Triglyceride is over 400; calculations on Lipids are invalid.   HDL 40.50 06/27/2023 1302   CHOLHDL 5 06/27/2023 1302   VLDL 73.8 (H) 06/23/2022 1126   LDLCALC 92  02/28/2009 0000   LDLDIRECT 116.0 06/27/2023 1302     Risk Assessment/Calculations:          Physical Exam:    VS:  BP (!) 134/90 (BP Location: Left Arm, Patient Position: Sitting, Cuff Size: Large)   Pulse 93   Ht 5\' 7"  (1.702 m)   Wt 170 lb (77.1 kg)   SpO2 97%   BMI 26.63 kg/m     Wt Readings from Last 3 Encounters:  12/05/23 170 lb (77.1 kg)  11/29/23 172 lb (78 kg)  10/13/23 176 lb (79.8 kg)     GEN:  Well nourished, well developed in no acute distress HEENT: Normal NECK: No JVD; No carotid bruits CARDIAC: RRR, no murmurs, rubs, gallops RESPIRATORY:  Clear to auscultation without rales, wheezing or rhonchi  ABDOMEN: Soft, non-tender, non-distended MUSCULOSKELETAL:  No edema; No deformity  SKIN: Warm and dry NEUROLOGIC:  Alert and oriented x 3 PSYCHIATRIC:  Normal affect   ASSESSMENT:    1. Precordial pain   2. Primary hypertension   3. Mixed hyperlipidemia    PLAN:    In order of problems listed above:  Chest pressure, several risk factors.  Get echocardiogram, get coronary CTA. Hypertension, BP elevated today, usually controlled.  Continue Benicar 40 mg daily. Hyperlipidemia, triglycerides and LDL elevated.  Increase Crestor to 20 mg daily.  Follow-up after cardiac testing.     Medication Adjustments/Labs and Tests Ordered: Current medicines are reviewed at length with the patient today.  Concerns regarding medicines are outlined above.  Orders Placed This Encounter  Procedures   CT CORONARY MORPH W/CTA COR W/SCORE W/CA W/CM &/OR WO/CM   Basic Metabolic Panel (BMET)   EKG 12-Lead   ECHOCARDIOGRAM COMPLETE   VAS US CAROTID   Meds ordered this encounter  Medications   rosuvastatin (CRESTOR) 20 MG tablet    Sig: Take 1 tablet (20 mg total) by mouth daily.    Dispense:  90 tablet    Refill:  0   metoprolol tartrate (LOPRESSOR) 100 MG tablet    Sig: Take  1 tablet (100 mg total) by mouth once for 1 dose. TAKE TWO HOURS PRIOR TO CARDIAC CT     Dispense:  1 tablet    Refill:  0   CORLANOR 5 MG TABS tablet    Sig: Take 3 tablets (15 mg total) by mouth once for 1 dose. TAKE TWO HOURS PRIOR TO CARDIAC CT    Dispense:  3 tablet    Refill:  0    CASH PAY ONLY    Patient Instructions  Medication Instructions:   INCREASE Rosuvastatin - Take one tablet ( 20mg ) by mouth daily.    *If you need a refill on your cardiac medications before your next appointment, please call your pharmacy*   Lab Work:  Your physician recommends you have labs today - BMP   If you have labs (blood work) drawn today and your tests are completely normal, you will receive your results only by: MyChart Message (if you have MyChart) OR A paper copy in the mail If you have any lab test that is abnormal or we need to change your treatment, we will call you to review the results.   Testing/Procedures:  Your physician has requested that you have an echocardiogram. Echocardiography is a painless test that uses sound waves to create images of your heart. It provides your doctor with information about the size and shape of your heart and how well your heart's chambers and valves are working. This procedure takes approximately one hour. There are no restrictions for this procedure. Please do NOT wear cologne, perfume, aftershave, or lotions (deodorant is allowed). Please arrive 15 minutes prior to your appointment time.  Please note: We ask at that you not bring children with you during ultrasound (echo/ vascular) testing. Due to room size and safety concerns, children are not allowed in the ultrasound rooms during exams. Our front office staff cannot provide observation of children in our lobby area while testing is being conducted. An adult accompanying a patient to their appointment will only be allowed in the ultrasound room at the discretion of the ultrasound technician under special circumstances. We apologize for any inconvenience.  2. Your physician has  requested that you have a carotid duplex. This test is an ultrasound of the carotid arteries in your neck. It looks at blood flow through these arteries that supply the brain with blood. Allow one hour for this exam. There are no restrictions or special instructions.     Your cardiac CT will be scheduled at:   [facility] [address] [place], [place] [address] [phone]  If scheduled at [facility], please arrive 15 mins early for check-in and test prep.  There is spacious parking and easy access to the radiology department from the [facility] Heart and Vascular entrance. Please enter here and check-in with the desk attendant.   Please follow these instructions carefully (unless otherwise directed):  An IV will be required for this test and Nitroglycerin will be given.  Hold all erectile dysfunction medications at least 3 days (72 hrs) prior to test. (Ie viagra, cialis, sildenafil, tadalafil, etc)   On the Night Before the Test: Be sure to Drink plenty of water. Do not consume any caffeinated/decaffeinated beverages or chocolate 12 hours prior to your test. Do not take any antihistamines 12 hours prior to your test.  On the Day of the Test: Drink plenty of water until 1 hour prior to the test. Do not eat any food 1 hour  prior to test. HOLD METFORMIN THE DAY OF YOUR CARDIAC CT AND 48 HOURS AFTER Take metoprolol (Lopressor) two hours prior to test. Take Corlanor two hours prior to test If you take Furosemide/Hydrochlorothiazide/Spironolactone/Chlorthalidone, please HOLD on the morning of the test. Patients who wear a continuous glucose monitor MUST remove the device prior to scanning. FEMALES- please wear underwire-free bra if available, avoid dresses & tight clothing  After the Test: Drink plenty of water. After receiving IV contrast, you may experience a mild flushed feeling. This is normal. On occasion, you may experience a mild  rash up to 24 hours after the test. This is not dangerous. If this occurs, you can take Benadryl 25 mg, Zyrtec, Claritin, or Allegra and increase your fluid intake. (Patients taking Tikosyn should avoid Benadryl, and may take Zyrtec, Claritin, or Allegra) If you experience trouble breathing, this can be serious. If it is severe call 911 IMMEDIATELY. If it is mild, please call our office.  We will call to schedule your test 2-4 weeks out understanding that some insurance companies will need an authorization prior to the service being performed.   For more information and frequently asked questions, please visit our website : [email or web address]  For non-scheduling related questions, please contact the cardiac imaging nurse navigator should you have any questions/concerns: Cardiac Imaging Nurse Navigators Direct Office Dial: [phone]   For scheduling needs, including cancellations and rescheduling, please call [place], [phone].\ Follow-Up: At [facility], you and your health needs are our priority.  As part of our continuing mission to provide you with exceptional heart care, we have created designated Provider Care Teams.  These Care Teams include your primary Cardiologist (physician) and Advanced Practice Providers (APPs -  Physician Assistants and Nurse Practitioners) who all work together to provide you with the care you need, when you need it.  We recommend signing up for the patient portal called "MyChart".  Sign up information is provided on this After Visit Summary.  MyChart is used to connect with patients for Virtual Visits (Telemedicine).  Patients are able to view lab/test results, encounter notes, upcoming appointments, etc.  Non-urgent messages can be sent to your provider as well.   To learn more about what you can do with MyChart, go to [email or web address].    Your next appointment:   8 - 10  week(s)  Provider:   You may see Debbe Odea,  MD or one of the following Advanced Practice Providers on your designated Care Team:   Nicolasa Ducking, NP Eula Listen, PA-C Cadence Fransico Michael, PA-C Charlsie Quest, NP Carlos Levering, NP   Signed, Debbe Odea, MD  12/05/2023 2:53 PM    [facility]

## 2023-12-05 NOTE — Telephone Encounter (Signed)
I have changed the order to be with contrast

## 2023-12-06 LAB — BASIC METABOLIC PANEL
BUN/Creatinine Ratio: 20 (ref 9–23)
BUN: 19 mg/dL (ref 6–24)
CO2: 17 mmol/L — ABNORMAL LOW (ref 20–29)
Calcium: 10.1 mg/dL (ref 8.7–10.2)
Chloride: 109 mmol/L — ABNORMAL HIGH (ref 96–106)
Creatinine, Ser: 0.94 mg/dL (ref 0.57–1.00)
Glucose: 124 mg/dL — ABNORMAL HIGH (ref 70–99)
Potassium: 4.3 mmol/L (ref 3.5–5.2)
Sodium: 144 mmol/L (ref 134–144)
eGFR: 70 mL/min/{1.73_m2} (ref >59)

## 2023-12-08 NOTE — Progress Notes (Unsigned)
Subjective:    Patient ID: [name], female    DOB: 1966-05-27, 58 y.o.   MRN: [id number]  HPI: [name] is a 58 y.o. female who is scheduled for Virtual visit, she has the flu.  I connected with  [name] by a video enabled telemedicine application and verified that I am speaking with the correct person using two identifiers.  Location: Patient: In her Home Provider: In the office    I discussed the limitations of evaluation and management by telemedicine and the availability of in person appointments. The patient expressed understanding and agreed to proceed.  [name] for follow up appointment for chronic pain and medication refill. She states her pain is located in her left arm, Left wrist and left hand. . She rates her pain 6. Her current exercise regime is walking and performing stretching exercises.  [name] Morphine equivalent is 40.00 MME.   Last Oral Swab was Performed 08/09/2024, it was consistent.    Pain Inventory Average Pain 6 Pain Right Now 6 My pain is constant, sharp, burning, dull, stabbing, tingling, and aching  In the last 24 hours, has pain interfered with the following? General activity 5 Relation with others 5 Enjoyment of life 5 What TIME of day is your pain at its worst? morning , daytime, evening, and night Sleep (in general) Poor  Pain is worse with: walking, bending, sitting, standing, and some activites Pain improves with: rest, heat/ice, pacing activities, and medication Relief from Meds: 6  Family History  Problem Relation Age of Onset   Cancer Mother        lung   Hypertension Mother    Cancer Father        colon   Hypertension Sister    Cancer Sister    Cancer Brother    Hyperlipidemia Sister    Birth defects Maternal Grandmother        breast   Breast cancer Maternal Grandmother    Birth defects Paternal Grandmother        uterine, stomach, lung   Breast cancer Paternal  Grandmother    Breast cancer Paternal Aunt    Heart attack Paternal Grandfather    Heart failure Paternal Grandfather    Social History   Socioeconomic History   Marital status: Married    Spouse name: Not on file   Number of children: 2   Years of education: Not on file   Highest education level: Not on file  Occupational History   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  Tobacco Use   Smoking status: Never    Passive exposure: Current   Smokeless tobacco: Never  Vaping Use   Vaping status: Never Used  Substance and Sexual Activity   Alcohol use: Not Currently    Comment: 01/24/2013 "drink or 2 once or /twice/year, if that"   Drug use: No   Sexual activity: Yes    Birth control/protection: None  Other Topics Concern   Not on file  Social History Narrative   No living will   Would want husband as POA--then sister, [name]   Would accept resuscitation attempts   Probably would not want tube feeds if cognitively unaware   Pt lives with family    Pt doesn't work    Social Drivers of Corporate investment banker Strain: Not on [employer] Insecurity: Not on file  Transportation Needs: Not on file  Physical Activity: Not on file  Stress: Not on file  Social Connections:  Not on file   Past Surgical History:  Procedure Laterality Date   APPENDECTOMY  ~ 06/2007   BLADDER REPAIR  ~ 06/2007   "same day after bladder lift" (01/24/2013)   BLADDER SUSPENSION  ~ 06/2007   BREAST BIOPSY Left    DIAGNOSTIC LAPAROSCOPY  1990's & ~ 2000   "I've had a couple; for endometrosis" (01/24/2013)   HERNIA REPAIR     INCISIONAL HERNIA REPAIR N/A 01/24/2013   Procedure: LAPAROSCOPIC INCISIONAL HERNIA;  Surgeon: Shelly Rubenstein, MD;  Location: [facility] OR;  Service: General;  Laterality: N/A;   INSERTION OF MESH N/A 01/24/2013   Procedure: INSERTION OF MESH;  Surgeon: Shelly Rubenstein, MD;  Location: [facility] OR;  Service: General;  Laterality: N/A;   LAPAROSCOPIC INCISIONAL / UMBILICAL / VENTRAL HERNIA REPAIR   01/24/2013   IHR w/mesh/notes   NASAL SEPTUM SURGERY  1980's?   OOPHORECTOMY Right 2009   TONSILLECTOMY  1990's   VAGINAL HYSTERECTOMY  ` 06/2007   Past Surgical History:  Procedure Laterality Date   APPENDECTOMY  ~ 06/2007   BLADDER REPAIR  ~ 06/2007   "same day after bladder lift" (01/24/2013)   BLADDER SUSPENSION  ~ 06/2007   BREAST BIOPSY Left    DIAGNOSTIC LAPAROSCOPY  1990's & ~ 2000   "I've had a couple; for endometrosis" (01/24/2013)   HERNIA REPAIR     INCISIONAL HERNIA REPAIR N/A 01/24/2013   Procedure: LAPAROSCOPIC INCISIONAL HERNIA;  Surgeon: Shelly Rubenstein, MD;  Location: [facility] OR;  Service: General;  Laterality: N/A;   INSERTION OF MESH N/A 01/24/2013   Procedure: INSERTION OF MESH;  Surgeon: Shelly Rubenstein, MD;  Location: [facility];  Service: General;  Laterality: N/A;   LAPAROSCOPIC INCISIONAL / UMBILICAL / VENTRAL HERNIA REPAIR  01/24/2013   IHR w/mesh/notes   NASAL SEPTUM SURGERY  1980's?   OOPHORECTOMY Right 2009   TONSILLECTOMY  1990's   VAGINAL HYSTERECTOMY  ` 06/2007   Past Medical History:  Diagnosis Date   Allergy    Anxiety    Arthritis    "just the norm" (01/24/2013)   ASCVD (arteriosclerotic cardiovascular disease)    Asthma    Celiac disease    Daily headache    "depends on the season" (01/24/2013)   Diabetes mellitus without complication (HCC)    Fibromyalgia    GERD (gastroesophageal reflux disease)    History of colonic polyps    Hyperlipidemia    Hypertension    Migraines    Obesity (BMI 30.0-34.9)    RSD (reflex sympathetic dystrophy)    Sleep apnea    NO MACHINE RECOMMENDED   TIA (transient ischemic attack) ~ 2009   There were no vitals taken for this visit.  Pt is at home and unable to check  Opioid Risk Score:   Fall Risk Score:  `1  Depression screen PHQ 2/9     12/09/2023    1:03 PM 11/08/2023    1:29 PM 10/10/2023    1:14 PM 07/08/2023    1:40 PM 06/27/2023   12:18 PM 06/27/2023   11:45 AM 06/08/2023    1:10 PM  Depression screen PHQ 2/9   Decreased Interest 1 1 1  0 0 1 1  Down, Depressed, Hopeless 1 1 1  0 1 1 1   PHQ - 2 Score 2 2 2  0 1 2 2   Altered sleeping      1   Tired, decreased energy      1   Change in appetite  0   Feeling bad or failure about yourself       2   Trouble concentrating      0   Moving slowly or fidgety/restless      1   Suicidal thoughts      0   PHQ-9 Score      7   Difficult doing work/chores      Very difficult      Review of Systems  All other systems reviewed and are negative.      Objective:   Physical Exam Vitals and nursing note reviewed.  Musculoskeletal:     Comments: No Physical Exam: Virtual Visit         Assessment & Plan:  1.Cervicalgia/ Cervical Radiculitis:  Continue to Monitor. 12/09/2023 2.Complex Regional Pain Syndrome Type 1: Continue Topamax and Cymbalta. 11/08/2023. Refilled: Hydro-codone 5/325mg  two tablets twice a day as needed for pain #120 and  Tramadol 100 mg ER daily #30 and Tramadol 50 mg one tablet by mouth BID. One tablet in the afternoon and one tablet in the evening. We will continue the opioid monitoring program, this consists of regular clinic visits, examinations, urine drug screen, pill counts as well as use of [place] Controlled Substance Reporting system. A 12 month History has been reviewed on the [place] Controlled Substance Reporting System  On 12/09/2023. 3. Depression: with Physical Illness:  Continue current medication regimen with Cymbalta.12/09/2023 4. Insomnia: Continue current medication regimen with  Trazodone one and half tablet at bedtime  12/09/2023 5. Muscle Spasms: Continue current medication regimen with Tizanidine. 02/21 /2025 6. Chronic Pain Syndrome: Continue Ibuprofen BID as needed. 12/09/2023 7. Migraine without Aura: Continue Topamax: Continue to Monitor. 12/09/2023 8. Dysphonia: She reports she was seen by ENT. S PCP Following. Continue to monitor.  12/09/2023 9. Trigeminal Neuralgia: [name]  reports her dentist Dr Hilda Blades diagnose her with Trigeminal Neuralgia. Neurology Following.  12/09/2023   F/U in 1 month  Virtual Visit Established Patient Location of Patient: In her Home Location of Provider: In the Office

## 2023-12-09 ENCOUNTER — Encounter: Payer: Worker's Compensation | Attending: Registered Nurse | Admitting: Registered Nurse

## 2023-12-09 ENCOUNTER — Encounter: Payer: Self-pay | Admitting: Registered Nurse

## 2023-12-09 DIAGNOSIS — G4700 Insomnia, unspecified: Secondary | ICD-10-CM | POA: Insufficient documentation

## 2023-12-09 DIAGNOSIS — Z79891 Long term (current) use of opiate analgesic: Secondary | ICD-10-CM | POA: Insufficient documentation

## 2023-12-09 DIAGNOSIS — F3289 Other specified depressive episodes: Secondary | ICD-10-CM | POA: Diagnosis present

## 2023-12-09 DIAGNOSIS — M62838 Other muscle spasm: Secondary | ICD-10-CM | POA: Diagnosis present

## 2023-12-09 DIAGNOSIS — G90512 Complex regional pain syndrome I of left upper limb: Secondary | ICD-10-CM | POA: Insufficient documentation

## 2023-12-09 DIAGNOSIS — Z5181 Encounter for therapeutic drug level monitoring: Secondary | ICD-10-CM | POA: Insufficient documentation

## 2023-12-09 DIAGNOSIS — G894 Chronic pain syndrome: Secondary | ICD-10-CM | POA: Insufficient documentation

## 2023-12-09 MED ORDER — HYDROCODONE-ACETAMINOPHEN 5-325 MG PO TABS: 2.0000 | ORAL_TABLET | Freq: Two times a day (BID) | ORAL | 0 refills | Status: DC | PRN

## 2023-12-09 MED ORDER — TRAMADOL HCL 50 MG PO TABS: ORAL_TABLET | ORAL | 2 refills | Status: DC

## 2023-12-09 MED ORDER — TRAMADOL HCL ER 100 MG PO TB24: 100.0000 mg | ORAL_TABLET | Freq: Every day | ORAL | 2 refills | Status: DC

## 2023-12-12 DIAGNOSIS — R599 Enlarged lymph nodes, unspecified: Secondary | ICD-10-CM | POA: Diagnosis not present

## 2023-12-15 ENCOUNTER — Encounter (HOSPITAL_COMMUNITY): Payer: Self-pay

## 2023-12-16 ENCOUNTER — Ambulatory Visit
Admission: RE | Admit: 2023-12-16 | Discharge: 2023-12-16 | Disposition: A | Discharge status: Home / Self Care | Payer: Medicare HMO | Source: Ambulatory Visit | Attending: Diagnostic Neuroimaging | Admitting: Diagnostic Neuroimaging

## 2023-12-16 DIAGNOSIS — M26621 Arthralgia of right temporomandibular joint: Secondary | ICD-10-CM

## 2023-12-16 DIAGNOSIS — G4451 Hemicrania continua: Secondary | ICD-10-CM | POA: Diagnosis not present

## 2023-12-16 DIAGNOSIS — K029 Dental caries, unspecified: Secondary | ICD-10-CM

## 2023-12-16 MED ORDER — IOPAMIDOL (ISOVUE-370) INJECTION 76%
60.0000 mL | Freq: Once | INTRAVENOUS | Status: AC | PRN
  Administered 2023-12-16: 60 mL via INTRAVENOUS

## 2023-12-19 ENCOUNTER — Ambulatory Visit
Admission: RE | Admit: 2023-12-19 | Discharge: 2023-12-19 | Disposition: A | Discharge status: Home / Self Care | Payer: Medicare HMO | Source: Ambulatory Visit | Attending: Cardiology | Admitting: Cardiology

## 2023-12-19 ENCOUNTER — Encounter: Payer: Self-pay | Admitting: Diagnostic Neuroimaging

## 2023-12-19 ENCOUNTER — Ambulatory Visit
Admission: RE | Admit: 2023-12-19 | Discharge: 2023-12-19 | Disposition: A | Discharge status: Home / Self Care | Payer: Medicare HMO | Source: Ambulatory Visit | Attending: Diagnostic Neuroimaging | Admitting: Diagnostic Neuroimaging

## 2023-12-19 DIAGNOSIS — G4451 Hemicrania continua: Secondary | ICD-10-CM

## 2023-12-19 DIAGNOSIS — R072 Precordial pain: Secondary | ICD-10-CM | POA: Insufficient documentation

## 2023-12-19 DIAGNOSIS — I639 Cerebral infarction, unspecified: Secondary | ICD-10-CM

## 2023-12-19 MED ORDER — METOPROLOL TARTRATE 5 MG/5ML IV SOLN
10.0000 mg | Freq: Once | INTRAVENOUS | Status: DC | PRN
  Filled 2023-12-19: qty 10

## 2023-12-19 MED ORDER — IOHEXOL 350 MG/ML SOLN
80.0000 mL | Freq: Once | INTRAVENOUS | Status: AC | PRN
  Administered 2023-12-19: 80 mL via INTRAVENOUS

## 2023-12-19 MED ORDER — NITROGLYCERIN 0.4 MG SL SUBL
SUBLINGUAL_TABLET | SUBLINGUAL | Status: AC
  Filled 2023-12-19: qty 2

## 2023-12-19 MED ORDER — DILTIAZEM HCL 25 MG/5ML IV SOLN
10.0000 mg | INTRAVENOUS | Status: DC | PRN
  Filled 2023-12-19: qty 5

## 2023-12-19 MED ORDER — GADOPICLENOL 0.5 MMOL/ML IV SOLN
7.5000 mL | Freq: Once | INTRAVENOUS | Status: DC | PRN
Start: 2023-12-19 — End: 2023-12-20

## 2023-12-19 MED ORDER — NITROGLYCERIN 0.4 MG SL SUBL
0.8000 mg | SUBLINGUAL_TABLET | Freq: Once | SUBLINGUAL | Status: AC
  Administered 2023-12-19: 0.8 mg via SUBLINGUAL
  Filled 2023-12-19: qty 25

## 2023-12-20 MED ORDER — ALPRAZOLAM 0.5 MG PO TABS: ORAL_TABLET | ORAL | 0 refills | Status: DC

## 2023-12-21 ENCOUNTER — Other Ambulatory Visit: Payer: Self-pay | Admitting: Cardiology

## 2023-12-21 DIAGNOSIS — E782 Mixed hyperlipidemia: Secondary | ICD-10-CM

## 2023-12-21 DIAGNOSIS — G459 Transient cerebral ischemic attack, unspecified: Secondary | ICD-10-CM

## 2023-12-21 DIAGNOSIS — R072 Precordial pain: Secondary | ICD-10-CM

## 2023-12-21 DIAGNOSIS — I10 Essential (primary) hypertension: Secondary | ICD-10-CM

## 2023-12-21 NOTE — Telephone Encounter (Signed)
 [employer] Berkley Harvey: [id number] exp. 12/21/23-06/18/24 sent to [facility] [phone]

## 2023-12-25 ENCOUNTER — Other Ambulatory Visit: Payer: Self-pay | Admitting: Diagnostic Neuroimaging

## 2023-12-26 ENCOUNTER — Ambulatory Visit (INDEPENDENT_AMBULATORY_CARE_PROVIDER_SITE_OTHER): Payer: Medicare HMO | Admitting: Otolaryngology

## 2023-12-26 NOTE — Telephone Encounter (Signed)
 Last seen on 11/29/23 Follow up scheduled on 02/28/24  Your prescribed 1 month supply for trial of indomethacin, received refill request.  Please advise

## 2023-12-28 ENCOUNTER — Ambulatory Visit: Payer: Medicare HMO | Attending: Cardiology

## 2023-12-28 ENCOUNTER — Ambulatory Visit (INDEPENDENT_AMBULATORY_CARE_PROVIDER_SITE_OTHER): Payer: Medicare HMO

## 2023-12-28 DIAGNOSIS — R072 Precordial pain: Secondary | ICD-10-CM

## 2023-12-28 DIAGNOSIS — G459 Transient cerebral ischemic attack, unspecified: Secondary | ICD-10-CM

## 2023-12-28 LAB — ECHOCARDIOGRAM COMPLETE
AR max vel: 3.03 cm2
AV Area VTI: 2.78 cm2
AV Area mean vel: 2.84 cm2
AV Mean grad: 2 mmHg
AV Peak grad: 3.6 mmHg
Ao pk vel: 0.95 m/s
Area-P 1/2: 3.42 cm2
S' Lateral: 3.3 cm
Single Plane A4C EF: 53.2 %

## 2024-01-02 ENCOUNTER — Encounter: Payer: Worker's Compensation | Attending: Registered Nurse | Admitting: Registered Nurse

## 2024-01-02 ENCOUNTER — Encounter: Payer: Self-pay | Admitting: Registered Nurse

## 2024-01-02 VITALS — BP 110/76 | HR 89 | Ht 67.0 in | Wt 165.0 lb

## 2024-01-02 DIAGNOSIS — M62838 Other muscle spasm: Secondary | ICD-10-CM | POA: Diagnosis present

## 2024-01-02 DIAGNOSIS — G90512 Complex regional pain syndrome I of left upper limb: Secondary | ICD-10-CM | POA: Diagnosis present

## 2024-01-02 DIAGNOSIS — Z79891 Long term (current) use of opiate analgesic: Secondary | ICD-10-CM | POA: Diagnosis present

## 2024-01-02 DIAGNOSIS — Z5181 Encounter for therapeutic drug level monitoring: Secondary | ICD-10-CM | POA: Diagnosis present

## 2024-01-02 DIAGNOSIS — G4700 Insomnia, unspecified: Secondary | ICD-10-CM | POA: Insufficient documentation

## 2024-01-02 DIAGNOSIS — F3289 Other specified depressive episodes: Secondary | ICD-10-CM | POA: Diagnosis present

## 2024-01-02 DIAGNOSIS — G894 Chronic pain syndrome: Secondary | ICD-10-CM | POA: Diagnosis not present

## 2024-01-02 MED ORDER — HYDROCODONE-ACETAMINOPHEN 5-325 MG PO TABS: 2.0000 | ORAL_TABLET | Freq: Two times a day (BID) | ORAL | 0 refills | Status: DC | PRN

## 2024-01-02 NOTE — Progress Notes (Signed)
 Subjective:    Patient ID: [name], female    DOB: Aug 02, 1966, 58 y.o.   MRN: [id number]  HPI: [name] is a 58 y.o. female who returns for follow up appointment for chronic pain and medication refill. She states her pain is located in her left arm and left hand. She rates her pain 8. Her current exercise regime is walking short distances  [name] Morphine equivalent is 60.00 MME.   Oral Swab was Performed today.      Pain Inventory Average Pain 7 Pain Right Now 8 My pain is intermittent, constant, sharp, burning, dull, stabbing, tingling, and aching  In the last 24 hours, has pain interfered with the following? General activity 6 Relation with others 6 Enjoyment of life 7 What TIME of day is your pain at its worst? morning , daytime, and evening Sleep (in general) Poor  Pain is worse with: walking, bending, sitting, inactivity, standing, and some activites Pain improves with: rest, pacing activities, and medication Relief from Meds: 6  Family History  Problem Relation Age of Onset   Cancer Mother        lung   Hypertension Mother    Cancer Father        colon   Hypertension Sister    Cancer Sister    Cancer Brother    Hyperlipidemia Sister    Birth defects Maternal Grandmother        breast   Breast cancer Maternal Grandmother    Birth defects Paternal Grandmother        uterine, stomach, lung   Breast cancer Paternal Grandmother    Breast cancer Paternal Aunt    Heart attack Paternal Grandfather    Heart failure Paternal Grandfather    Social History   Socioeconomic History   Marital status: Married    Spouse name: Not on file   Number of children: 2   Years of education: Not on file   Highest education level: Not on file  Occupational History   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  Tobacco Use   Smoking status: Never    Passive exposure: Current   Smokeless tobacco: Never  Vaping Use   Vaping status: Never Used   Substance and Sexual Activity   Alcohol use: Not Currently    Comment: 01/24/2013 "drink or 2 once or /twice/year, if that"   Drug use: No   Sexual activity: Yes    Birth control/protection: None  Other Topics Concern   Not on file  Social History Narrative   No living will   Would want husband as POA--then sister, [name]   Would accept resuscitation attempts   Probably would not want tube feeds if cognitively unaware   Pt lives with family    Pt doesn't work    Social Drivers of Corporate investment banker Strain: Not on file  Food Insecurity: Not on file  Transportation Needs: Not on file  Physical Activity: Not on file  Stress: Not on file  Social Connections: Not on file   Past Surgical History:  Procedure Laterality Date   APPENDECTOMY  ~ 06/2007   BLADDER REPAIR  ~ 06/2007   "same day after bladder lift" (01/24/2013)   BLADDER SUSPENSION  ~ 06/2007   BREAST BIOPSY Left    DIAGNOSTIC LAPAROSCOPY  1990's & ~ 2000   "I've had a couple; for endometrosis" (01/24/2013)   HERNIA REPAIR     INCISIONAL HERNIA REPAIR N/A 01/24/2013   Procedure: LAPAROSCOPIC INCISIONAL  HERNIA;  Surgeon: Shelly Rubenstein, MD;  Location: [facility] OR;  Service: General;  Laterality: N/A;   INSERTION OF MESH N/A 01/24/2013   Procedure: INSERTION OF MESH;  Surgeon: Shelly Rubenstein, MD;  Location: [facility] OR;  Service: General;  Laterality: N/A;   LAPAROSCOPIC INCISIONAL / UMBILICAL / VENTRAL HERNIA REPAIR  01/24/2013   IHR w/mesh/notes   NASAL SEPTUM SURGERY  1980's?   OOPHORECTOMY Right 2009   TONSILLECTOMY  1990's   VAGINAL HYSTERECTOMY  ` 06/2007   Past Surgical History:  Procedure Laterality Date   APPENDECTOMY  ~ 06/2007   BLADDER REPAIR  ~ 06/2007   "same day after bladder lift" (01/24/2013)   BLADDER SUSPENSION  ~ 06/2007   BREAST BIOPSY Left    DIAGNOSTIC LAPAROSCOPY  1990's & ~ 2000   "I've had a couple; for endometrosis" (01/24/2013)   HERNIA REPAIR     INCISIONAL HERNIA REPAIR N/A 01/24/2013    Procedure: LAPAROSCOPIC INCISIONAL HERNIA;  Surgeon: Shelly Rubenstein, MD;  Location: [facility] OR;  Service: General;  Laterality: N/A;   INSERTION OF MESH N/A 01/24/2013   Procedure: INSERTION OF MESH;  Surgeon: Shelly Rubenstein, MD;  Location: [facility] OR;  Service: General;  Laterality: N/A;   LAPAROSCOPIC INCISIONAL / UMBILICAL / VENTRAL HERNIA REPAIR  01/24/2013   IHR w/mesh/notes   NASAL SEPTUM SURGERY  1980's?   OOPHORECTOMY Right 2009   TONSILLECTOMY  1990's   VAGINAL HYSTERECTOMY  ` 06/2007   Past Medical History:  Diagnosis Date   Allergy    Anxiety    Arthritis    "just the norm" (01/24/2013)   ASCVD (arteriosclerotic cardiovascular disease)    Asthma    Celiac disease    Daily headache    "depends on the season" (01/24/2013)   Diabetes mellitus without complication (HCC)    Fibromyalgia    GERD (gastroesophageal reflux disease)    History of colonic polyps    Hyperlipidemia    Hypertension    Migraines    Obesity (BMI 30.0-34.9)    RSD (reflex sympathetic dystrophy)    Sleep apnea    NO MACHINE RECOMMENDED   TIA (transient ischemic attack) ~ 2009   There were no vitals taken for this visit.  Opioid Risk Score:   Fall Risk Score:  `1  Depression screen PHQ 2/9     12/09/2023    1:03 PM 11/08/2023    1:29 PM 10/10/2023    1:14 PM 07/08/2023    1:40 PM 06/27/2023   12:18 PM 06/27/2023   11:45 AM 06/08/2023    1:10 PM  Depression screen PHQ 2/9  Decreased Interest 1 1 1  0 0 1 1  Down, Depressed, Hopeless 1 1 1  0 1 1 1   PHQ - 2 Score 2 2 2  0 1 2 2   Altered sleeping      1   Tired, decreased energy      1   Change in appetite      0   Feeling bad or failure about yourself       2   Trouble concentrating      0   Moving slowly or fidgety/restless      1   Suicidal thoughts      0   PHQ-9 Score      7   Difficult doing work/chores      Very difficult     Review of Systems  Musculoskeletal:        Patient  has pain on the left arm and wrist  All other systems reviewed and  are negative.      Objective:   Physical Exam Vitals and nursing note reviewed.  Constitutional:      Appearance: Normal appearance.  Neck:     Comments: Cervical Paraspinal Tenderness: C-5-C-6 Cardiovascular:     Rate and Rhythm: Normal rate and regular rhythm.     Pulses: Normal pulses.     Heart sounds: Normal heart sounds.  Pulmonary:     Effort: Pulmonary effort is normal.     Breath sounds: Normal breath sounds.  Musculoskeletal:     Comments: Normal Muscle Bulk and Muscle Testing Reveals:  Upper Extremities: Right: Full ROM and Muscle Strength 4/5 Left: Upper Extremity: Decreased ROM 90 Degrees 3/5 Thoracic Paraspinal Tenderness: T-1-T-7 Mainly Left Side  Lower Extremities: Decreased ROM and Muscle Strength 5/5 Bilateral Lower Extremities Decreased ROM and Muscle Strength 5/5 Bilateral Lower Extremities Flexion Produces Pain into her Bilateral Lower Extremities and Bilateral Feet Arises from Table slowly  Antalgic  Gait     Skin:    General: Skin is warm and dry.  Neurological:     Mental Status: She is alert and oriented to person, place, and time.  Psychiatric:        Mood and Affect: Mood normal.        Behavior: Behavior normal.         Assessment & Plan:  1.Cervicalgia/ Cervical Radiculitis:  Continue to Monitor. 01/02/2024 2.Complex Regional Pain Syndrome Type 1: Continue Topamax and Cymbalta. 01/02/2024. Refilled: Hydro-codone 5/325mg  two tablets twice a day as needed for pain #120 and  Tramadol 100 mg ER daily #30 and Tramadol 50 mg one tablet by mouth BID. One tablet in the afternoon and one tablet in the evening. We will continue the opioid monitoring program, this consists of regular clinic visits, examinations, urine drug screen, pill counts as well as use of [place] Controlled Substance Reporting system. A 12 month History has been reviewed on the [place] Controlled Substance Reporting System  On 01/02/2024. 3. Depression: with Physical  Illness:  Continue current medication regimen with Cymbalta.01/02/2024 4. Insomnia: Continue current medication regimen with  Trazodone one and half tablet at bedtime  01/02/2024 5. Muscle Spasms: Continue current medication regimen with Tizanidine. 03/17 /2025 6. Chronic Pain Syndrome: Continue Ibuprofen BID as needed. 01/02/2024 7. Migraine without Aura: Continue Topamax: Continue to Monitor. 01/02/2024 8. Dysphonia: She reports she was seen by ENT. S PCP Following. Continue to monitor.  01/02/2024 9. Trigeminal Neuralgia: [name] reports her dentist Dr Hilda Blades diagnose her with Trigeminal Neuralgia. Neurology Following.  01/02/2024   F/U in 2 months

## 2024-01-04 NOTE — Progress Notes (Signed)
 Results are good, no major findings. Continue current plan. -VRP

## 2024-01-09 DIAGNOSIS — R9082 White matter disease, unspecified: Secondary | ICD-10-CM | POA: Diagnosis not present

## 2024-01-09 DIAGNOSIS — G4451 Hemicrania continua: Secondary | ICD-10-CM | POA: Diagnosis not present

## 2024-01-09 DIAGNOSIS — H748X2 Other specified disorders of left middle ear and mastoid: Secondary | ICD-10-CM | POA: Diagnosis not present

## 2024-01-10 LAB — DRUG TOX MONITOR 1 W/CONF, ORAL FLD
Amphetamines: NEGATIVE ng/mL (ref <10)
Barbiturates: NEGATIVE ng/mL (ref <10)
Benzodiazepines: NEGATIVE ng/mL (ref <0.50)
Buprenorphine: NEGATIVE ng/mL (ref <0.10)
Cocaine: NEGATIVE ng/mL (ref <5.0)
Codeine: NEGATIVE ng/mL (ref <2.5)
Dihydrocodeine: 3.3 ng/mL — ABNORMAL HIGH (ref <2.5)
Fentanyl: NEGATIVE ng/mL (ref <0.10)
Heroin Metabolite: NEGATIVE ng/mL (ref <1.0)
Hydrocodone: 44.5 ng/mL — ABNORMAL HIGH (ref <2.5)
Hydromorphone: NEGATIVE ng/mL (ref <2.5)
MARIJUANA: NEGATIVE ng/mL (ref <2.5)
MDMA: NEGATIVE ng/mL (ref <10)
Meprobamate: NEGATIVE ng/mL (ref <2.5)
Methadone: NEGATIVE ng/mL (ref <5.0)
Morphine: NEGATIVE ng/mL (ref <2.5)
Nicotine Metabolite: NEGATIVE ng/mL (ref <5.0)
Norhydrocodone: 6.8 ng/mL — ABNORMAL HIGH (ref <2.5)
Noroxycodone: NEGATIVE ng/mL (ref <2.5)
Opiates: POSITIVE ng/mL — AB (ref <2.5)
Oxycodone: NEGATIVE ng/mL (ref <2.5)
Oxymorphone: NEGATIVE ng/mL (ref <2.5)
Phencyclidine: NEGATIVE ng/mL (ref <10)
Tapentadol: NEGATIVE ng/mL (ref <5.0)
Tramadol: >500 ng/mL — ABNORMAL HIGH (ref <5.0)
Tramadol: POSITIVE ng/mL — AB (ref <5.0)
Zolpidem: NEGATIVE ng/mL (ref <5.0)

## 2024-01-10 LAB — DRUG TOX ALC METAB W/CON, ORAL FLD: Alcohol Metabolite: NEGATIVE ng/mL (ref <25)

## 2024-01-11 ENCOUNTER — Encounter: Payer: Self-pay | Admitting: Neurology

## 2024-01-16 NOTE — Telephone Encounter (Signed)
 MRI Report given to MD as well.

## 2024-01-17 MED ORDER — ASPIRIN 81 MG PO TBEC: 81.0000 mg | DELAYED_RELEASE_TABLET | Freq: Every day | ORAL | Status: AC

## 2024-01-17 NOTE — Telephone Encounter (Signed)
 Mild chronic small vessel ischemic disease noted.  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  Recommend to start aspirin 81 mg daily.  Will complete stroke risk factor workup.    Orders Placed This Encounter  Procedures   CT ANGIO HEAD W OR WO CONTRAST   CT ANGIO NECK W OR WO CONTRAST   ECHOCARDIOGRAM COMPLETE BUBBLE STUDY   Meds ordered this encounter  Medications   aspirin EC 81 MG tablet    Sig: Take 1 tablet (81 mg total) by mouth daily. Swallow whole.   Suanne Marker, MD 01/17/2024, 4:37 PM Certified in Neurology, Neurophysiology and Neuroimaging  [facility] [address] [place], [place] [address] [phone]

## 2024-01-17 NOTE — Addendum Note (Signed)
 Addended by: Joycelyn Schmid R on: 01/17/2024 04:37 PM   Modules accepted: Orders

## 2024-01-18 ENCOUNTER — Telehealth: Payer: Self-pay | Admitting: Diagnostic Neuroimaging

## 2024-01-18 NOTE — Telephone Encounter (Signed)
 I spoke with MD regarding the form. He is unaware of them and what they are specifically looking for. MD recommended pt continue with stroke risk factor work up at this time.

## 2024-01-18 NOTE — Telephone Encounter (Signed)
 sent to [facility] they obtain [facility] Berkley Harvey [phone]

## 2024-01-18 NOTE — Telephone Encounter (Signed)
 Diamond located form and will speak with Dr. Marjory Lies to see if he approves completing this for pt

## 2024-01-19 ENCOUNTER — Telehealth: Payer: Self-pay

## 2024-01-19 NOTE — Telephone Encounter (Signed)
 I spoke to the pt. I advised her that I did not feel comfortable doing this form. Even though the company itself may be a real place, there are several numbers on the form that do not match the website. Also, if you call the number, they say hello. They do not answer it as the business name. The person I spoke to told me that is because they put their cell  phone numbers to call.

## 2024-01-19 NOTE — Telephone Encounter (Signed)
 Alecia Lemming with [facility] calling to ck status of faxed form for genetic testing for pt that was faxed on 01/10/24.Alecia Lemming request form faxed back by end of this work week. Fax # [phone]. I spoke with Carollee Herter CMA and she has form and Alecia Lemming made aware form was received and could not guarantee when form would be faxed back. Sending note to [facility] CMA.

## 2024-01-23 ENCOUNTER — Other Ambulatory Visit: Payer: Self-pay | Admitting: Diagnostic Neuroimaging

## 2024-01-23 ENCOUNTER — Ambulatory Visit
Admission: RE | Admit: 2024-01-23 | Discharge: 2024-01-23 | Disposition: A | Discharge status: Home / Self Care | Source: Ambulatory Visit | Attending: Diagnostic Neuroimaging | Admitting: Diagnostic Neuroimaging

## 2024-01-23 ENCOUNTER — Encounter: Payer: Self-pay | Admitting: Internal Medicine

## 2024-01-23 DIAGNOSIS — I6521 Occlusion and stenosis of right carotid artery: Secondary | ICD-10-CM | POA: Diagnosis not present

## 2024-01-23 DIAGNOSIS — I639 Cerebral infarction, unspecified: Secondary | ICD-10-CM

## 2024-01-23 DIAGNOSIS — Z09 Encounter for follow-up examination after completed treatment for conditions other than malignant neoplasm: Secondary | ICD-10-CM | POA: Diagnosis not present

## 2024-01-23 DIAGNOSIS — I6603 Occlusion and stenosis of bilateral middle cerebral arteries: Secondary | ICD-10-CM | POA: Diagnosis not present

## 2024-01-23 DIAGNOSIS — I63542 Cerebral infarction due to unspecified occlusion or stenosis of left cerebellar artery: Secondary | ICD-10-CM | POA: Diagnosis not present

## 2024-01-23 MED ORDER — IOPAMIDOL (ISOVUE-370) INJECTION 76%
75.0000 mL | Freq: Once | INTRAVENOUS | Status: AC | PRN
  Administered 2024-01-23: 75 mL via INTRAVENOUS

## 2024-01-23 NOTE — Telephone Encounter (Signed)
 Left message on VM for pt to give me a call back. I need to let her know what is happening with this.

## 2024-01-23 NOTE — Telephone Encounter (Signed)
 Alecia Lemming called back was very aggressive that we are not right. He has spoke to patient and she does want labs. Advised victor that we will reach out to patient and verify. He states he would like to speak to CMA that is handling. Informed him that that CMA is not available to take his call. He requested to talk with the office manager he wanted someone that would take the verbal from patient. Let Alecia Lemming know that is not option. We will reach out to the patient at our first availability to verify with patient. At that time the line was somehow disconnected. Will send message to CMA to address.

## 2024-01-23 NOTE — Telephone Encounter (Signed)
 Pt returned my call. She stated that she was not on the phone with anyone from that company at anytime today. She does not want [place] to fax the form back as it is a medicare fraud.

## 2024-01-24 DIAGNOSIS — K2270 Barrett's esophagus without dysplasia: Secondary | ICD-10-CM | POA: Diagnosis not present

## 2024-01-25 ENCOUNTER — Telehealth: Payer: Self-pay | Admitting: Diagnostic Neuroimaging

## 2024-01-25 NOTE — Telephone Encounter (Signed)
 The person had originally advised the phone staff they were sending results  which is why I said they can fax them to [place]. However per previous discussions the pt and MD were not moving forward with these test. I have faxed the forms back advising this information.  **IF they call back please advise they should not longer contact us.

## 2024-01-25 NOTE — Telephone Encounter (Signed)
 [facility]/ Alecia Lemming faxing over lab requisition form for primary immune deficiency panel test.  Have refaxed forms for a response.Fax: [phone]. Discuss with Baird Lyons, RN, she advised to have him fax to [phone].

## 2024-01-30 ENCOUNTER — Encounter: Payer: Self-pay | Admitting: Cardiology

## 2024-01-30 ENCOUNTER — Ambulatory Visit

## 2024-01-30 ENCOUNTER — Ambulatory Visit: Payer: Medicare HMO | Attending: Cardiology | Admitting: Cardiology

## 2024-01-30 VITALS — BP 118/78 | HR 81 | Resp 16 | Ht 67.0 in | Wt 165.4 lb

## 2024-01-30 DIAGNOSIS — R002 Palpitations: Secondary | ICD-10-CM | POA: Diagnosis not present

## 2024-01-30 DIAGNOSIS — I10 Essential (primary) hypertension: Secondary | ICD-10-CM | POA: Diagnosis not present

## 2024-01-30 DIAGNOSIS — R072 Precordial pain: Secondary | ICD-10-CM | POA: Diagnosis not present

## 2024-01-30 DIAGNOSIS — E782 Mixed hyperlipidemia: Secondary | ICD-10-CM

## 2024-01-30 NOTE — Patient Instructions (Signed)
 Medication Instructions:  NO CHANGES  *If you need a refill on your cardiac medications before your next appointment, please call your pharmacy*  Lab Work: FASTING lipid panel in 6 months  If you have labs (blood work) drawn today and your tests are completely normal, you will receive your results only by: MyChart Message (if you have MyChart) OR A paper copy in the mail If you have any lab test that is abnormal or we need to change your treatment, we will call you to review the results.  Testing/Procedures: Delane Fear- Long Term Monitor Instructions  Your physician has requested you wear a ZIO patch monitor for 14 days.  This is a single patch monitor. [employer] supplies one patch monitor per enrollment. Additional stickers are not available. Please do not apply patch if you will be having a Nuclear Stress Test,  Echocardiogram, Cardiac CT, MRI, or Chest Xray during the period you would be wearing the  monitor. The patch cannot be worn during these tests. You cannot remove and re-apply the  ZIO XT patch monitor.  Your ZIO patch monitor will be mailed 3 day USPS to your address on file. It may take 3-5 days  to receive your monitor after you have been enrolled.  Once you have received your monitor, please review the enclosed instructions. Your monitor  has already been registered assigning a specific monitor serial # to you.  Billing and Patient Assistance Program Information  We have supplied [employer] with any of your insurance information on file for billing purposes. [employer] offers a sliding scale Patient Assistance Program for patients that do not have  insurance, or whose insurance does not completely cover the cost of the ZIO monitor.  You must apply for the Patient Assistance Program to qualify for this discounted rate.  To apply, please call [employer] at [phone], select option 4, select option 2, ask to apply for  Patient Assistance Program. Sanna Crystal will ask your household  income, and how many people  are in your household. They will quote your out-of-pocket cost based on that information.  [employer] will also be able to set up a 53-month, interest-free payment plan if needed.  Applying the monitor   Shave hair from upper left chest.  Hold abrader disc by orange tab. Rub abrader in 40 strokes over the upper left chest as  indicated in your monitor instructions.  Clean area with 4 enclosed alcohol pads. Let dry.  Apply patch as indicated in monitor instructions. Patch will be placed under collarbone on left  side of chest with arrow pointing upward.  Rub patch adhesive wings for 2 minutes. Remove white label marked "1". Remove the white  label marked "2". Rub patch adhesive wings for 2 additional minutes.  While looking in a mirror, press and release button in center of patch. A small green light will  flash 3-4 times. This will be your only indicator that the monitor has been turned on.  Do not shower for the first 24 hours. You may shower after the first 24 hours.  Press the button if you feel a symptom. You will hear a small click. Record Date, Time and  Symptom in the Patient Logbook.  When you are ready to remove the patch, follow instructions on the last 2 pages of Patient  Logbook. Stick patch monitor onto the last page of Patient Logbook.  Place Patient Logbook in the blue and white box. Use locking tab on box and tape box closed  securely. The  blue and white box has prepaid postage on it. Please place it in the mailbox as  soon as possible. Your physician should have your test results approximately 7 days after the  monitor has been mailed back to [facility].  Call [facility] Customer Care at [phone] if you have questions regarding  your ZIO XT patch monitor. Call them immediately if you see an orange light blinking on your  monitor.  If your monitor falls off in less than 4 days, contact our Monitor department at [phone].  If your  monitor becomes loose or falls off after 4 days call [employer] at [phone] for  suggestions on securing your monitor   Follow-Up: At [facility], you and your health needs are our priority.  As part of our continuing mission to provide you with exceptional heart care, our providers are all part of one team.  This team includes your primary Cardiologist (physician) and Advanced Practice Providers or APPs (Physician Assistants and Nurse Practitioners) who all work together to provide you with the care you need, when you need it.  Your next appointment:   6 month(s)  Provider:   You may see Constancia Delton, MD or one of the following Advanced Practice Providers on your designated Care Team:   Laneta Pintos, NP Gildardo Labrador, PA-C Varney Gentleman, PA-C Cadence [place], PA-C Ronald Cockayne, NP Morey Ar, NP    We recommend signing up for the patient portal called "MyChart".  Sign up information is provided on this After Visit Summary.  MyChart is used to connect with patients for Virtual Visits (Telemedicine).  Patients are able to view lab/test results, encounter notes, upcoming appointments, etc.  Non-urgent messages can be sent to your provider as well.   To learn more about what you can do with MyChart, go to [email or web address].

## 2024-01-30 NOTE — Progress Notes (Signed)
 Cardiology Office Note:    Date:  01/30/2024   ID:  [name], DOB 24-Nov-1965, MRN [id number]  PCP:  Helaine Llanos, MD   [facility] Providers Cardiologist:  Constancia Delton, MD     Referring MD: Helaine Llanos, MD   Chief Complaint  Patient presents with   Follow-up    8-10 weeks    History of Present Illness:    [name] is a 58 y.o. female with a hx of hypertension, hyperlipidemia, asthma, TIA, presenting for follow-up.  Previously seen for chest heaviness.  Echocardiogram and coronary CT obtained to evaluate cardiac etiology.  Patient presents for cardiac testing results.  Also complains of occasional heart flutters, followed up with neurology, head imaging showed small vessel disease.  Echo with bubble study scheduled by neurologist.   Past Medical History:  Diagnosis Date   Allergy    Anxiety    Arthritis    "just the norm" (01/24/2013)   ASCVD (arteriosclerotic cardiovascular disease)    Asthma    Celiac disease    Daily headache    "depends on the season" (01/24/2013)   Diabetes mellitus without complication (HCC)    Fibromyalgia    GERD (gastroesophageal reflux disease)    History of colonic polyps    Hyperlipidemia    Hypertension    Migraines    Obesity (BMI 30.0-34.9)    RSD (reflex sympathetic dystrophy)    Sleep apnea    NO MACHINE RECOMMENDED   TIA (transient ischemic attack) ~ 2009    Past Surgical History:  Procedure Laterality Date   APPENDECTOMY  ~ 06/2007   BLADDER REPAIR  ~ 06/2007   "same day after bladder lift" (01/24/2013)   BLADDER SUSPENSION  ~ 06/2007   BREAST BIOPSY Left    DIAGNOSTIC LAPAROSCOPY  1990's & ~ 2000   "I've had a couple; for endometrosis" (01/24/2013)   HERNIA REPAIR     INCISIONAL HERNIA REPAIR N/A 01/24/2013   Procedure: LAPAROSCOPIC INCISIONAL HERNIA;  Surgeon: Rogena Class, MD;  Location: [facility] OR;  Service: General;  Laterality: N/A;   INSERTION OF MESH N/A 01/24/2013    Procedure: INSERTION OF MESH;  Surgeon: Rogena Class, MD;  Location: [facility] OR;  Service: General;  Laterality: N/A;   LAPAROSCOPIC INCISIONAL / UMBILICAL / VENTRAL HERNIA REPAIR  01/24/2013   IHR w/mesh/notes   NASAL SEPTUM SURGERY  1980's?   OOPHORECTOMY Right 2009   TONSILLECTOMY  1990's   VAGINAL HYSTERECTOMY  ` 06/2007    Current Medications: Current Meds  Medication Sig   albuterol (VENTOLIN HFA) 108 (90 Base) MCG/ACT inhaler Inhale 2 puffs into the lungs every 6 (six) hours as needed for wheezing or shortness of breath.   aspirin EC 81 MG tablet Take 1 tablet (81 mg total) by mouth daily. Swallow whole.   budesonide-formoterol (SYMBICORT) 160-4.5 MCG/ACT inhaler Inhale 2 puffs into the lungs daily.   CYMBALTA 60 MG capsule Take 60 mg by mouth daily.   DULoxetine (CYMBALTA) 30 MG capsule Take 1 capsule (30 mg total) by mouth daily.   empagliflozin (JARDIANCE) 10 MG TABS tablet Take 1 tablet (10 mg total) by mouth daily before breakfast.   fluconazole (DIFLUCAN) 150 MG tablet Take 1 tablet (150 mg total) by mouth 2 (two) times a week.   fluticasone (FLONASE) 50 MCG/ACT nasal spray Place 2 sprays into both nostrils daily.   HYDROcodone-acetaminophen (NORCO/VICODIN) 5-325 MG tablet Take 2 tablets by mouth 2 (two) times daily as  needed for moderate pain (pain score 4-6).   ibuprofen (ADVIL) 600 MG tablet Take 1 tablet (600 mg total) by mouth every 8 (eight) hours as needed for mild pain (pain score 1-3).   loratadine (CLARITIN) 10 MG tablet Take 10 mg by mouth daily.   metFORMIN (GLUCOPHAGE-XR) 500 MG 24 hr tablet TAKE 2 TABLETS BY MOUTH EVERY DAY WITH BREAKFAST   montelukast (SINGULAIR) 10 MG tablet TAKE 1 TABLET BY MOUTH EVERYDAY AT BEDTIME   olmesartan (BENICAR) 40 MG tablet TAKE 1 TABLET BY MOUTH EVERY DAY   omeprazole (PRILOSEC) 40 MG capsule Take 40 mg by mouth 2 (two) times daily.   rosuvastatin (CRESTOR) 20 MG tablet Take 1 tablet (20 mg total) by mouth daily.   tiZANidine  (ZANAFLEX) 4 MG tablet Take 1 tablet (4 mg total) by mouth every 6 (six) hours as needed for muscle spasms.   topiramate (TOPAMAX) 100 MG tablet TAKE 1 TABLET BY MOUTH EVERY MORNING AND TAKE 2 TABLETS BY MOUTH EVERY EVENING   traMADol (ULTRAM) 50 MG tablet TAKE 1 TABLET BY MOUTH TWICE A DAY. DIRECTION CHANGE.   traMADol (ULTRAM-ER) 100 MG 24 hr tablet Take 1 tablet (100 mg total) by mouth daily.   traZODone (DESYREL) 50 MG tablet TAKE 1 AND 1/2 TABLETS BY MOUTH AT BEDTIME   [DISCONTINUED] Accu-Chek Softclix Lancets lancets Use as instructed   [DISCONTINUED] ALPRAZolam (XANAX) 0.5 MG tablet Take one tablet prior to procedure may repeat at the time of procedure (must have driver to and from procedure )   [DISCONTINUED] esomeprazole (NEXIUM) 40 MG capsule Take 40 mg by mouth daily.   [DISCONTINUED] indomethacin (INDOCIN) 25 MG capsule TAKE 1 CAPSULE (25 MG TOTAL) BY MOUTH 2 (TWO) TIMES DAILY WITH A MEAL.   [DISCONTINUED] metoprolol tartrate (LOPRESSOR) 100 MG tablet Take 1 tablet (100 mg total) by mouth once for 1 dose. TAKE TWO HOURS PRIOR TO CARDIAC CT     Allergies:   Meperidine, Cymbalta [duloxetine hcl], Diazepam, Other, and Shellfish allergy   Social History   Socioeconomic History   Marital status: Married    Spouse name: Not on file   Number of children: 2   Years of education: Not on file   Highest education level: Not on file  Occupational History   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  Tobacco Use   Smoking status: Never    Passive exposure: Current   Smokeless tobacco: Never  Vaping Use   Vaping status: Never Used  Substance and Sexual Activity   Alcohol use: Not Currently    Comment: 01/24/2013 "drink or 2 once or /twice/year, if that"   Drug use: No   Sexual activity: Yes    Birth control/protection: None  Other Topics Concern   Not on file  Social History Narrative   No living will   Would want husband as POA--then sister, Bernardo Bridgeman   Would accept resuscitation attempts    Probably would not want tube feeds if cognitively unaware   Pt lives with family    Pt doesn't work    Social Drivers of Corporate investment banker Strain: Not on Ship broker Insecurity: Not on file  Transportation Needs: Not on file  Physical Activity: Not on file  Stress: Not on file  Social Connections: Not on file     Family History: The patient's family history includes Birth defects in her maternal grandmother and paternal grandmother; Breast cancer in her maternal grandmother, paternal aunt, and paternal grandmother; Cancer in her  brother, father, mother, and sister; Heart attack in her paternal grandfather; Heart failure in her paternal grandfather; Hyperlipidemia in her sister; Hypertension in her mother and sister.  ROS:   Please see the history of present illness.     All other systems reviewed and are negative.  EKGs/Labs/Other Studies Reviewed:    The following studies were reviewed today:       Recent Labs: 06/27/2023: ALT 14; Hemoglobin 13.2; Platelets 299.0; TSH 1.87 12/05/2023: BUN 19; Creatinine, Ser 0.94; Potassium 4.3; Sodium 144  Recent Lipid Panel    Component Value Date/Time   CHOL 196 06/27/2023 1302   TRIG (H) 06/27/2023 1302    446.0 Triglyceride is over 400; calculations on Lipids are invalid.   HDL 40.50 06/27/2023 1302   CHOLHDL 5 06/27/2023 1302   VLDL 73.8 (H) 06/23/2022 1126   LDLCALC 92 02/28/2009 0000   LDLDIRECT 116.0 06/27/2023 1302     Risk Assessment/Calculations:          Physical Exam:    VS:  BP 118/78 (BP Location: Right Arm, Patient Position: Sitting, Cuff Size: Normal)   Pulse 81   Resp 16   Ht 5\' 7"  (1.702 m)   Wt 165 lb 6.4 oz (75 kg)   SpO2 97%   BMI 25.91 kg/m     Wt Readings from Last 3 Encounters:  01/30/24 165 lb 6.4 oz (75 kg)  01/02/24 165 lb (74.8 kg)  12/05/23 170 lb (77.1 kg)     GEN:  Well nourished, well developed in no acute distress HEENT: Normal NECK: No JVD; No carotid bruits CARDIAC: RRR,  no murmurs, rubs, gallops RESPIRATORY:  Clear to auscultation without rales, wheezing or rhonchi  ABDOMEN: Soft, non-tender, non-distended MUSCULOSKELETAL:  No edema; No deformity  SKIN: Warm and dry NEUROLOGIC:  Alert and oriented x 3 PSYCHIATRIC:  Normal affect   ASSESSMENT:    1. Palpitations   2. Precordial pain   3. Primary hypertension   4. Mixed hyperlipidemia    PLAN:    In order of problems listed above:  Palpitations, history of TIA.  Place cardiac monitor x 2 weeks to evaluate A-fib or flutter.  Bubble study ordered by neurology. Chest pressure, coronary CT 3/25 no CAD.  Echo 3/25 EF 60 to 65%.  Follow-up with PCP regarding workup for noncardiac causes. Hypertension, BP controlled.  Continue Benicar 40 mg daily. Hyperlipidemia, continue Crestor 20 mg daily.  Crestor recently increased.  Follow-up in 6 months.  Or earlier if significant arrhythmias noted on cardiac monitor.     Medication Adjustments/Labs and Tests Ordered: Current medicines are reviewed at length with the patient today.  Concerns regarding medicines are outlined above.  Orders Placed This Encounter  Procedures   Lipid panel   LONG TERM MONITOR (3-14 DAYS)   No orders of the defined types were placed in this encounter.   Patient Instructions  Medication Instructions:  NO CHANGES  *If you need a refill on your cardiac medications before your next appointment, please call your pharmacy*  Lab Work: FASTING lipid panel in 6 months  If you have labs (blood work) drawn today and your tests are completely normal, you will receive your results only by: MyChart Message (if you have MyChart) OR A paper copy in the mail If you have any lab test that is abnormal or we need to change your treatment, we will call you to review the results.  Testing/Procedures: ZIO XT- Long Term Monitor Instructions  Your physician has requested  you wear a ZIO patch monitor for 14 days.  This is a single patch  monitor. [employer] supplies one patch monitor per enrollment. Additional stickers are not available. Please do not apply patch if you will be having a Nuclear Stress Test,  Echocardiogram, Cardiac CT, MRI, or Chest Xray during the period you would be wearing the  monitor. The patch cannot be worn during these tests. You cannot remove and re-apply the  ZIO XT patch monitor.  Your ZIO patch monitor will be mailed 3 day USPS to your address on file. It may take 3-5 days  to receive your monitor after you have been enrolled.  Once you have received your monitor, please review the enclosed instructions. Your monitor  has already been registered assigning a specific monitor serial # to you.  Billing and Patient Assistance Program Information  We have supplied [employer] with any of your insurance information on file for billing purposes. [employer] offers a sliding scale Patient Assistance Program for patients that do not have  insurance, or whose insurance does not completely cover the cost of the ZIO monitor.  You must apply for the Patient Assistance Program to qualify for this discounted rate.  To apply, please call [employer] at [phone], select option 4, select option 2, ask to apply for  Patient Assistance Program. Sanna Crystal will ask your household income, and how many people  are in your household. They will quote your out-of-pocket cost based on that information.  [employer] will also be able to set up a 43-month, interest-free payment plan if needed.  Applying the monitor   Shave hair from upper left chest.  Hold abrader disc by orange tab. Rub abrader in 40 strokes over the upper left chest as  indicated in your monitor instructions.  Clean area with 4 enclosed alcohol pads. Let dry.  Apply patch as indicated in monitor instructions. Patch will be placed under collarbone on left  side of chest with arrow pointing upward.  Rub patch adhesive wings for 2 minutes. Remove white label marked "1".  Remove the white  label marked "2". Rub patch adhesive wings for 2 additional minutes.  While looking in a mirror, press and release button in center of patch. A small green light will  flash 3-4 times. This will be your only indicator that the monitor has been turned on.  Do not shower for the first 24 hours. You may shower after the first 24 hours.  Press the button if you feel a symptom. You will hear a small click. Record Date, Time and  Symptom in the Patient Logbook.  When you are ready to remove the patch, follow instructions on the last 2 pages of Patient  Logbook. Stick patch monitor onto the last page of Patient Logbook.  Place Patient Logbook in the blue and white box. Use locking tab on box and tape box closed  securely. The blue and white box has prepaid postage on it. Please place it in the mailbox as  soon as possible. Your physician should have your test results approximately 7 days after the  monitor has been mailed back to [facility].  Call [facility] Customer Care at [phone] if you have questions regarding  your ZIO XT patch monitor. Call them immediately if you see an orange light blinking on your  monitor.  If your monitor falls off in less than 4 days, contact our Monitor department at [phone].  If your monitor becomes loose or falls off after 4 days call  [employer] at [phone] for  suggestions on securing your monitor   Follow-Up: At [facility], you and your health needs are our priority.  As part of our continuing mission to provide you with exceptional heart care, our providers are all part of one team.  This team includes your primary Cardiologist (physician) and Advanced Practice Providers or APPs (Physician Assistants and Nurse Practitioners) who all work together to provide you with the care you need, when you need it.  Your next appointment:   6 month(s)  Provider:   You may see Constancia Delton, MD or one of the following  Advanced Practice Providers on your designated Care Team:   Laneta Pintos, NP Gildardo Labrador, PA-C Varney Gentleman, PA-C Cadence [place], PA-C Ronald Cockayne, NP Morey Ar, NP    We recommend signing up for the patient portal called "MyChart".  Sign up information is provided on this After Visit Summary.  MyChart is used to connect with patients for Virtual Visits (Telemedicine).  Patients are able to view lab/test results, encounter notes, upcoming appointments, etc.  Non-urgent messages can be sent to your provider as well.   To learn more about what you can do with MyChart, go to [email or web address].        Signed, Constancia Delton, MD  01/30/2024 2:43 PM    [facility]

## 2024-01-31 ENCOUNTER — Encounter: Payer: Self-pay | Admitting: Diagnostic Neuroimaging

## 2024-02-05 ENCOUNTER — Encounter: Payer: Self-pay | Admitting: Diagnostic Neuroimaging

## 2024-02-06 NOTE — Telephone Encounter (Signed)
 Attempted to call Pt. No answer, LVM

## 2024-02-08 ENCOUNTER — Telehealth: Payer: Self-pay

## 2024-02-08 NOTE — Telephone Encounter (Signed)
 Spoke w/Pt regarding pains in head. Pt stated the pain is better today but it never really goes away. Stated over the weekend had sharp shooting pains on left side of forehead, in top of head and in back of head. Pain level was 20/10. Stated she took ibuprofen  600mg  x 2 every four hours, rested in bed and used ice to ease the pains. Last took ibuprofen  1200mg  last night at approx midnight and has only taken hydrocodone  today. Pain level today is 6/10. Informed Pt MD may want to review CT angio before making any recommendations. Pt stated understanding.

## 2024-02-15 DIAGNOSIS — I10 Essential (primary) hypertension: Secondary | ICD-10-CM | POA: Diagnosis not present

## 2024-02-15 DIAGNOSIS — E119 Type 2 diabetes mellitus without complications: Secondary | ICD-10-CM | POA: Diagnosis not present

## 2024-02-17 ENCOUNTER — Ambulatory Visit (HOSPITAL_COMMUNITY): Attending: Cardiology

## 2024-02-17 DIAGNOSIS — E785 Hyperlipidemia, unspecified: Secondary | ICD-10-CM | POA: Diagnosis not present

## 2024-02-17 DIAGNOSIS — I77810 Thoracic aortic ectasia: Secondary | ICD-10-CM | POA: Diagnosis not present

## 2024-02-17 DIAGNOSIS — E119 Type 2 diabetes mellitus without complications: Secondary | ICD-10-CM | POA: Diagnosis not present

## 2024-02-17 DIAGNOSIS — I10 Essential (primary) hypertension: Secondary | ICD-10-CM

## 2024-02-17 DIAGNOSIS — I639 Cerebral infarction, unspecified: Secondary | ICD-10-CM | POA: Diagnosis not present

## 2024-02-17 LAB — ECHOCARDIOGRAM COMPLETE BUBBLE STUDY
Area-P 1/2: 3.68 cm2
S' Lateral: 2.4 cm

## 2024-02-20 DIAGNOSIS — Z1211 Encounter for screening for malignant neoplasm of colon: Secondary | ICD-10-CM | POA: Diagnosis not present

## 2024-02-20 DIAGNOSIS — K2270 Barrett's esophagus without dysplasia: Secondary | ICD-10-CM | POA: Diagnosis not present

## 2024-02-20 DIAGNOSIS — E119 Type 2 diabetes mellitus without complications: Secondary | ICD-10-CM | POA: Diagnosis not present

## 2024-02-21 NOTE — Progress Notes (Unsigned)
 Subjective:    Patient ID: [name], female    DOB: 1966/05/11, 58 y.o.   MRN: [id number]  HPI: [name] is a 58 y.o. female who returns for follow up appointment for chronic pain and medication refill. She states her pain is located in her left hand and left arm. She rates her pain 8. Her current exercise regime is walking and performing stretching exercises.  [name] reports last week she was getting up from the couch,  experience dizziness and fell forward. Her cousin helped her up. She didn't seek medical attention. Educated on falls prevention, she verbalizes understanding.   [name] Morphine  equivalent is 40.00 MME.  Last Oral Swab was Performed on 01/02/2024, it was consistent.     Pain Inventory Average Pain 7 Pain Right Now 8 My pain is constant, sharp, burning, dull, stabbing, tingling, and aching  In the last 24 hours, has pain interfered with the following? General activity 6 Relation with others 6 Enjoyment of life 6 What TIME of day is your pain at its worst? morning , daytime, evening, and night Sleep (in general) Poor  Pain is worse with: walking, bending, sitting, standing, and some activites Pain improves with: rest, heat/ice, therapy/exercise, medication, and TENS Relief from Meds: 6  Family History  Problem Relation Age of Onset   Cancer Mother        lung   Hypertension Mother    Cancer Father        colon   Hypertension Sister    Cancer Sister    Cancer Brother    Hyperlipidemia Sister    Birth defects Maternal Grandmother        breast   Breast cancer Maternal Grandmother    Birth defects Paternal Grandmother        uterine, stomach, lung   Breast cancer Paternal Grandmother    Breast cancer Paternal Aunt    Heart attack Paternal Grandfather    Heart failure Paternal Grandfather    Social History   Socioeconomic History   Marital status: Married    Spouse name: Not on file   Number of  children: 2   Years of education: Not on file   Highest education level: Not on file  Occupational History   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  Tobacco Use   Smoking status: Never    Passive exposure: Current   Smokeless tobacco: Never  Vaping Use   Vaping status: Never Used  Substance and Sexual Activity   Alcohol  use: Not Currently    Comment: 01/24/2013 "drink or 2 once or /twice/year, if that"   Drug use: No   Sexual activity: Yes    Birth control/protection: None  Other Topics Concern   Not on file  Social History Narrative   No living will   Would want husband as POA--then sister, [name]   Would accept resuscitation attempts   Probably would not want tube feeds if cognitively unaware   Pt lives with family    Pt doesn't work    Social Drivers of Corporate investment banker Strain: Not on file  Food Insecurity: Not on file  Transportation Needs: Not on file  Physical Activity: Not on file  Stress: Not on file  Social Connections: Not on file   Past Surgical History:  Procedure Laterality Date   APPENDECTOMY  ~ 06/2007   BLADDER REPAIR  ~ 06/2007   "same day after bladder lift" (01/24/2013)   BLADDER SUSPENSION  ~  06/2007   BREAST BIOPSY Left    DIAGNOSTIC LAPAROSCOPY  1990's & ~ 2000   "I've had a couple; for endometrosis" (01/24/2013)   HERNIA REPAIR     INCISIONAL HERNIA REPAIR N/A 01/24/2013   Procedure: LAPAROSCOPIC INCISIONAL HERNIA;  Surgeon: Rogena Class, MD;  Location: [facility];  Service: General;  Laterality: N/A;   INSERTION OF MESH N/A 01/24/2013   Procedure: INSERTION OF MESH;  Surgeon: Rogena Class, MD;  Location: [facility];  Service: General;  Laterality: N/A;   LAPAROSCOPIC INCISIONAL / UMBILICAL / VENTRAL HERNIA REPAIR  01/24/2013   IHR w/mesh/notes   NASAL SEPTUM SURGERY  1980's?   OOPHORECTOMY Right 2009   TONSILLECTOMY  1990's   VAGINAL HYSTERECTOMY  ` 06/2007   Past Surgical History:  Procedure Laterality Date   APPENDECTOMY  ~ 06/2007   BLADDER  REPAIR  ~ 06/2007   "same day after bladder lift" (01/24/2013)   BLADDER SUSPENSION  ~ 06/2007   BREAST BIOPSY Left    DIAGNOSTIC LAPAROSCOPY  1990's & ~ 2000   "I've had a couple; for endometrosis" (01/24/2013)   HERNIA REPAIR     INCISIONAL HERNIA REPAIR N/A 01/24/2013   Procedure: LAPAROSCOPIC INCISIONAL HERNIA;  Surgeon: Rogena Class, MD;  Location: [facility];  Service: General;  Laterality: N/A;   INSERTION OF MESH N/A 01/24/2013   Procedure: INSERTION OF MESH;  Surgeon: Rogena Class, MD;  Location: [facility];  Service: General;  Laterality: N/A;   LAPAROSCOPIC INCISIONAL / UMBILICAL / VENTRAL HERNIA REPAIR  01/24/2013   IHR w/mesh/notes   NASAL SEPTUM SURGERY  1980's?   OOPHORECTOMY Right 2009   TONSILLECTOMY  1990's   VAGINAL HYSTERECTOMY  ` 06/2007   Past Medical History:  Diagnosis Date   Allergy    Anxiety    Arthritis    "just the norm" (01/24/2013)   ASCVD (arteriosclerotic cardiovascular disease)    Asthma    Celiac disease    Daily headache    "depends on the season" (01/24/2013)   Diabetes mellitus without complication (HCC)    Fibromyalgia    GERD (gastroesophageal reflux disease)    History of colonic polyps    Hyperlipidemia    Hypertension    Migraines    Obesity (BMI 30.0-34.9)    RSD (reflex sympathetic dystrophy)    Sleep apnea    NO MACHINE RECOMMENDED   TIA (transient ischemic attack) ~ 2009   BP 131/85   Pulse 86   Ht 5\' 7"  (1.702 m)   Wt 164 lb 12.8 oz (74.8 kg)   SpO2 96%   BMI 25.81 kg/m   Opioid Risk Score:   Fall Risk Score:  `1  Depression screen PHQ 2/9     02/22/2024    1:21 PM 12/09/2023    1:03 PM 11/08/2023    1:29 PM 10/10/2023    1:14 PM 07/08/2023    1:40 PM 06/27/2023   12:18 PM 06/27/2023   11:45 AM  Depression screen PHQ 2/9  Decreased Interest 0 1 1 1  0 0 1  Down, Depressed, Hopeless 0 1 1 1  0 1 1  PHQ - 2 Score 0 2 2 2  0 1 2  Altered sleeping       1  Tired, decreased energy       1  Change in appetite       0  Feeling bad  or failure about yourself        2  Trouble  concentrating       0  Moving slowly or fidgety/restless       1  Suicidal thoughts       0  PHQ-9 Score       7  Difficult doing work/chores       Very difficult     Review of Systems  Musculoskeletal:        Left arm  Psychiatric/Behavioral:  Positive for dysphoric mood.   All other systems reviewed and are negative.      Objective:   Physical Exam Vitals and nursing note reviewed.  Constitutional:      Appearance: Normal appearance.  Cardiovascular:     Rate and Rhythm: Normal rate and regular rhythm.     Pulses: Normal pulses.     Heart sounds: Normal heart sounds.  Pulmonary:     Effort: Pulmonary effort is normal.     Breath sounds: Normal breath sounds.  Musculoskeletal:     Comments: Normal Muscle Bulk and Muscle Testing Reveals:  Upper Extremities: Full ROM and Muscle Strength  4/5 Thoracic and  Lumbar Hypersensitivity Lower Extremities: Decreased ROM and Muscle Strength 5/5 Bilateral Lower Extremities Flexion Produces Pain into her Bilateral Lower Extremities and Bilateral Feet Arises from Table slowly Antalgic  Gait     Skin:    General: Skin is warm and dry.  Neurological:     Mental Status: She is alert and oriented to person, place, and time.  Psychiatric:        Mood and Affect: Mood normal.        Behavior: Behavior normal.         Assessment & Plan:  1.Cervicalgia/ Cervical Radiculitis:  Continue to Monitor. 02/22/2024 2.Complex Regional Pain Syndrome Type 1: Continue Topamax  and Cymbalta . 02/22/2024. Refilled: Hydro-codone 5/325mg  two tablets twice a day as needed for pain #120 and  Tramadol  100 mg ER daily #30 and Tramadol  50 mg one tablet by mouth BID. One tablet in the afternoon and one tablet in the evening. We will continue the opioid monitoring program, this consists of regular clinic visits, examinations, urine drug screen, pill counts as well as use of [place]  Controlled Substance  Reporting system. A 12 month History has been reviewed on the [place]  Controlled Substance Reporting System  On 01/02/2024. 3. Depression: with Physical Illness:  Continue current medication regimen with Cymbalta .02/22/2024 4. Insomnia: Continue current medication regimen with  Trazodone  one and half tablet at bedtime  03507/2025 5. Muscle Spasms: Continue current medication regimen with Tizanidine . 05/07 /2025 6. Chronic Pain Syndrome: Continue Ibuprofen  BID as needed. 02/22/2024 7. Migraine without Aura: Continue Topamax : Continue to Monitor. 02/22/2024 8. Dysphonia: She reports she was seen by ENT. S PCP Following. Continue to monitor.  02/22/2024 9. Trigeminal Neuralgia: Ms. Lollie Risen reports her dentist Dr Remona Carmel diagnose her with Trigeminal Neuralgia. Neurology Following.  02/22/2024 10. Fall in home: Subsequent Encounter: Educated on [employer], she verbalizes understanding.  F/U in 2 months

## 2024-02-22 ENCOUNTER — Encounter: Payer: Worker's Compensation | Attending: Registered Nurse | Admitting: Registered Nurse

## 2024-02-22 ENCOUNTER — Encounter: Payer: Self-pay | Admitting: Registered Nurse

## 2024-02-22 VITALS — BP 131/85 | HR 86 | Ht 67.0 in | Wt 164.8 lb

## 2024-02-22 DIAGNOSIS — W19XXXD Unspecified fall, subsequent encounter: Secondary | ICD-10-CM | POA: Diagnosis not present

## 2024-02-22 DIAGNOSIS — G90512 Complex regional pain syndrome I of left upper limb: Secondary | ICD-10-CM | POA: Diagnosis not present

## 2024-02-22 DIAGNOSIS — Z79891 Long term (current) use of opiate analgesic: Secondary | ICD-10-CM | POA: Diagnosis not present

## 2024-02-22 DIAGNOSIS — G894 Chronic pain syndrome: Secondary | ICD-10-CM

## 2024-02-22 DIAGNOSIS — Z5181 Encounter for therapeutic drug level monitoring: Secondary | ICD-10-CM

## 2024-02-22 DIAGNOSIS — F3289 Other specified depressive episodes: Secondary | ICD-10-CM | POA: Diagnosis present

## 2024-02-22 DIAGNOSIS — R002 Palpitations: Secondary | ICD-10-CM | POA: Diagnosis not present

## 2024-02-22 DIAGNOSIS — Y92009 Unspecified place in unspecified non-institutional (private) residence as the place of occurrence of the external cause: Secondary | ICD-10-CM | POA: Diagnosis not present

## 2024-02-22 DIAGNOSIS — G4700 Insomnia, unspecified: Secondary | ICD-10-CM | POA: Diagnosis not present

## 2024-02-22 MED ORDER — TRAMADOL HCL ER 100 MG PO TB24: 100.0000 mg | ORAL_TABLET | Freq: Every day | ORAL | 2 refills | Status: DC

## 2024-02-22 MED ORDER — TIZANIDINE HCL 4 MG PO TABS: 4.0000 mg | ORAL_TABLET | Freq: Four times a day (QID) | ORAL | 3 refills | Status: DC | PRN

## 2024-02-22 MED ORDER — HYDROCODONE-ACETAMINOPHEN 5-325 MG PO TABS: 2.0000 | ORAL_TABLET | Freq: Two times a day (BID) | ORAL | 0 refills | Status: DC | PRN

## 2024-02-22 MED ORDER — TOPIRAMATE 100 MG PO TABS: ORAL_TABLET | ORAL | 1 refills | Status: DC

## 2024-02-24 DIAGNOSIS — R002 Palpitations: Secondary | ICD-10-CM

## 2024-02-27 ENCOUNTER — Ambulatory Visit (INDEPENDENT_AMBULATORY_CARE_PROVIDER_SITE_OTHER): Payer: Medicare HMO | Admitting: Internal Medicine

## 2024-02-27 ENCOUNTER — Encounter: Payer: Self-pay | Admitting: Internal Medicine

## 2024-02-27 VITALS — BP 130/80 | HR 80 | Temp 98.3°F | Ht 67.0 in | Wt 166.0 lb

## 2024-02-27 DIAGNOSIS — G501 Atypical facial pain: Secondary | ICD-10-CM | POA: Diagnosis not present

## 2024-02-27 DIAGNOSIS — I10 Essential (primary) hypertension: Secondary | ICD-10-CM | POA: Diagnosis not present

## 2024-02-27 DIAGNOSIS — E1159 Type 2 diabetes mellitus with other circulatory complications: Secondary | ICD-10-CM

## 2024-02-27 DIAGNOSIS — Z7984 Long term (current) use of oral hypoglycemic drugs: Secondary | ICD-10-CM | POA: Diagnosis not present

## 2024-02-27 DIAGNOSIS — E1149 Type 2 diabetes mellitus with other diabetic neurological complication: Secondary | ICD-10-CM | POA: Insufficient documentation

## 2024-02-27 LAB — POCT GLYCOSYLATED HEMOGLOBIN (HGB A1C): Hemoglobin A1C: 6.6 % — AB (ref 4.0–5.6)

## 2024-02-27 NOTE — Progress Notes (Signed)
 Subjective:    Patient ID: [name], female    DOB: Jan 28, 1966, 58 y.o.   MRN: [id number]  HPI Here for follow up of diabetes and other chronic health conditions  Checks sugars a couple of times a week 111-130's fasting Burning, tingling and numbness is bad in her feet Now using magnesium  oil on it  Had colonoscopy (5 polyps removed) and EGD last week Stable Barrett's and some gastritis (biopsy done) GI doesn't think the voice issues are from acid reflux--still on omeprazole  Wants new neurologist---doesn't feel he is responsive Ongoing facial pain Is on topiramate   Current Outpatient Medications on File Prior to Visit  Medication Sig Dispense Refill   albuterol  (VENTOLIN  HFA) 108 (90 Base) MCG/ACT inhaler Inhale 2 puffs into the lungs every 6 (six) hours as needed for wheezing or shortness of breath. 18 g 1   aspirin  EC 81 MG tablet Take 1 tablet (81 mg total) by mouth daily. Swallow whole.     budesonide -formoterol  (SYMBICORT ) 160-4.5 MCG/ACT inhaler Inhale 2 puffs into the lungs daily. 1 each 12   CYMBALTA  60 MG capsule Take 60 mg by mouth daily.     DULoxetine  (CYMBALTA ) 30 MG capsule Take 1 capsule (30 mg total) by mouth daily. 90 capsule 3   empagliflozin  (JARDIANCE ) 10 MG TABS tablet Take 1 tablet (10 mg total) by mouth daily before breakfast. 30 tablet 11   fluconazole  (DIFLUCAN ) 150 MG tablet Take 1 tablet (150 mg total) by mouth 2 (two) times a week. 8 tablet 3   fluticasone  (FLONASE ) 50 MCG/ACT nasal spray Place 2 sprays into both nostrils daily. 16 g 6   HYDROcodone -acetaminophen  (NORCO/VICODIN) 5-325 MG tablet Take 2 tablets by mouth 2 (two) times daily as needed for moderate pain (pain score 4-6). 120 tablet 0   ibuprofen  (ADVIL ) 600 MG tablet Take 1 tablet (600 mg total) by mouth every 8 (eight) hours as needed for mild pain (pain score 1-3). 90 tablet 5   loratadine (CLARITIN) 10 MG tablet Take 10 mg by mouth daily.     metFORMIN  (GLUCOPHAGE -XR) 500 MG  24 hr tablet TAKE 2 TABLETS BY MOUTH EVERY DAY WITH BREAKFAST 180 tablet 3   montelukast  (SINGULAIR ) 10 MG tablet TAKE 1 TABLET BY MOUTH EVERYDAY AT BEDTIME 90 tablet 3   olmesartan (BENICAR) 40 MG tablet TAKE 1 TABLET BY MOUTH EVERY DAY 90 tablet 3   omeprazole (PRILOSEC) 40 MG capsule Take 40 mg by mouth 2 (two) times daily.     rosuvastatin  (CRESTOR ) 20 MG tablet Take 1 tablet (20 mg total) by mouth daily. 90 tablet 0   tiZANidine  (ZANAFLEX ) 4 MG tablet Take 1 tablet (4 mg total) by mouth every 6 (six) hours as needed for muscle spasms. 120 tablet 3   topiramate  (TOPAMAX ) 100 MG tablet TAKE 1 TABLET BY MOUTH EVERY MORNING AND TAKE 2 TABLETS BY MOUTH EVERY EVENING 270 tablet 1   traMADol  (ULTRAM ) 50 MG tablet TAKE 1 TABLET BY MOUTH TWICE A DAY. DIRECTION CHANGE. 60 tablet 2   traMADol  (ULTRAM -ER) 100 MG 24 hr tablet Take 1 tablet (100 mg total) by mouth daily. 30 tablet 2   traZODone  (DESYREL ) 50 MG tablet TAKE 1 AND 1/2 TABLETS BY MOUTH AT BEDTIME 135 tablet 3   [DISCONTINUED] losartan  (COZAAR ) 100 MG tablet TAKE 1 TABLET BY MOUTH EVERY DAY 90 tablet 0   No current facility-administered medications on file prior to visit.    Allergies  Allergen Reactions   Meperidine  Anaphylaxis,  Itching and Swelling    REACTION: swelling   Cymbalta  [Duloxetine  Hcl] Hives    Generic makes her feel like she is crawling out of her skin   Diazepam Hives    REACTION: swelling   Other     Axe Cologne   Shellfish Allergy Hives    Past Medical History:  Diagnosis Date   Allergy    Anxiety    Arthritis    "just the norm" (01/24/2013)   ASCVD (arteriosclerotic cardiovascular disease)    Asthma    Celiac disease    Daily headache    "depends on the season" (01/24/2013)   Diabetes mellitus without complication (HCC)    Fibromyalgia    GERD (gastroesophageal reflux disease)    History of colonic polyps    Hyperlipidemia    Hypertension    Migraines    Obesity (BMI 30.0-34.9)    RSD (reflex  sympathetic dystrophy)    Sleep apnea    NO MACHINE RECOMMENDED   TIA (transient ischemic attack) ~ 2009    Past Surgical History:  Procedure Laterality Date   APPENDECTOMY  ~ 06/2007   BLADDER REPAIR  ~ 06/2007   "same day after bladder lift" (01/24/2013)   BLADDER SUSPENSION  ~ 06/2007   BREAST BIOPSY Left    DIAGNOSTIC LAPAROSCOPY  1990's & ~ 2000   "I've had a couple; for endometrosis" (01/24/2013)   HERNIA REPAIR     INCISIONAL HERNIA REPAIR N/A 01/24/2013   Procedure: LAPAROSCOPIC INCISIONAL HERNIA;  Surgeon: Rogena Class, MD;  Location: [facility];  Service: General;  Laterality: N/A;   INSERTION OF MESH N/A 01/24/2013   Procedure: INSERTION OF MESH;  Surgeon: Rogena Class, MD;  Location: [facility];  Service: General;  Laterality: N/A;   LAPAROSCOPIC INCISIONAL / UMBILICAL / VENTRAL HERNIA REPAIR  01/24/2013   IHR w/mesh/notes   NASAL SEPTUM SURGERY  1980's?   OOPHORECTOMY Right 2009   TONSILLECTOMY  1990's   VAGINAL HYSTERECTOMY  ` 06/2007    Family History  Problem Relation Age of Onset   Cancer Mother        lung   Hypertension Mother    Cancer Father        colon   Hypertension Sister    Cancer Sister    Cancer Brother    Hyperlipidemia Sister    Birth defects Maternal Grandmother        breast   Breast cancer Maternal Grandmother    Birth defects Paternal Grandmother        uterine, stomach, lung   Breast cancer Paternal Grandmother    Breast cancer Paternal Aunt    Heart attack Paternal Grandfather    Heart failure Paternal Grandfather     Social History   Socioeconomic History   Marital status: Married    Spouse name: Not on file   Number of children: 2   Years of education: Not on file   Highest education level: Not on file  Occupational History   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  Tobacco Use   Smoking status: Never    Passive exposure: Current   Smokeless tobacco: Never  Vaping Use   Vaping status: Never Used  Substance and Sexual Activity    Alcohol  use: Not Currently    Comment: 01/24/2013 "drink or 2 once or /twice/year, if that"   Drug use: No   Sexual activity: Yes    Birth control/protection: None  Other Topics Concern   Not on file  Social History Narrative   No living will   Would want husband as POA--then sister, Bernardo Bridgeman   Would accept resuscitation attempts   Probably would not want tube feeds if cognitively unaware   Pt lives with family    Pt doesn't work    Social Drivers of Corporate investment banker Strain: Not on [employer] Insecurity: Not on file  Transportation Needs: Not on file  Physical Activity: Not on file  Stress: Not on file  Social Connections: Not on file  Intimate Partner Violence: Not on file   Review of Systems Not sleeping well--despite the trazodone  Appetite is fair--weight is down some    Objective:   Physical Exam Constitutional:      Appearance: Normal appearance.  Cardiovascular:     Rate and Rhythm: Normal rate and regular rhythm.     Heart sounds: No murmur heard.    No gallop.     Comments: Feet are cold but normal pulses Pulmonary:     Effort: Pulmonary effort is normal.     Breath sounds: Normal breath sounds. No wheezing or rales.  Musculoskeletal:     Cervical back: Neck supple.  Lymphadenopathy:     Cervical: No cervical adenopathy.  Skin:    Comments: No foot lesions  Neurological:     Mental Status: She is alert.            Assessment & Plan:

## 2024-02-27 NOTE — Addendum Note (Signed)
 Addended by: Franne Ivory on: 02/27/2024 03:49 PM   Modules accepted: Orders

## 2024-02-27 NOTE — Assessment & Plan Note (Signed)
 BP Readings from Last 3 Encounters:  02/27/24 130/80  02/22/24 131/85  01/30/24 118/78   Controlled on olmesartan 40

## 2024-02-27 NOTE — Assessment & Plan Note (Signed)
 A1c actually down to 6.6% On jardiance  10, metformin  1000 daily

## 2024-02-27 NOTE — Assessment & Plan Note (Signed)
 Going back to neuro tomorrow Really needs alternative Rx but limited choices with her other medications

## 2024-02-27 NOTE — Assessment & Plan Note (Signed)
 Separate from her CRPS Gabapentin  no help On duloxetine  90mg  daily Discussed lidocaine , TENS, alpha lipoic acid

## 2024-02-27 NOTE — Patient Instructions (Signed)
 Please try topical lidocaine , &/or oral alpha lipoic acid--for the neuropathy

## 2024-02-28 ENCOUNTER — Ambulatory Visit: Payer: Medicare HMO | Admitting: Diagnostic Neuroimaging

## 2024-02-28 ENCOUNTER — Encounter: Payer: Self-pay | Admitting: Diagnostic Neuroimaging

## 2024-02-28 ENCOUNTER — Encounter: Payer: Self-pay | Admitting: Internal Medicine

## 2024-02-28 ENCOUNTER — Ambulatory Visit: Payer: Self-pay | Admitting: Internal Medicine

## 2024-02-28 VITALS — BP 120/80 | HR 96 | Ht 67.0 in | Wt 166.0 lb

## 2024-02-28 DIAGNOSIS — R519 Headache, unspecified: Secondary | ICD-10-CM | POA: Diagnosis not present

## 2024-02-28 LAB — MAGNESIUM: Magnesium: 2.1 mg/dL (ref 1.5–2.5)

## 2024-02-28 LAB — COMPREHENSIVE METABOLIC PANEL WITH GFR
ALT: 10 U/L (ref 0–35)
AST: 11 U/L (ref 0–37)
Albumin: 4.6 g/dL (ref 3.5–5.2)
Alkaline Phosphatase: 69 U/L (ref 39–117)
BUN: 21 mg/dL (ref 6–23)
CO2: 24 meq/L (ref 19–32)
Calcium: 9.3 mg/dL (ref 8.4–10.5)
Chloride: 108 meq/L (ref 96–112)
Creatinine, Ser: 0.86 mg/dL (ref 0.40–1.20)
GFR: 73.67 mL/min (ref >60.00)
Glucose, Bld: 102 mg/dL — ABNORMAL HIGH (ref 70–99)
Potassium: 4.3 meq/L (ref 3.5–5.1)
Sodium: 140 meq/L (ref 135–145)
Total Bilirubin: 0.3 mg/dL (ref 0.2–1.2)
Total Protein: 6.6 g/dL (ref 6.0–8.3)

## 2024-02-28 LAB — CBC
HCT: 40.7 % (ref 36.0–46.0)
Hemoglobin: 13.1 g/dL (ref 12.0–15.0)
MCHC: 32.1 g/dL (ref 30.0–36.0)
MCV: 86.5 fl (ref 78.0–100.0)
Platelets: 266 10*3/uL (ref 150.0–400.0)
RBC: 4.7 Mil/uL (ref 3.87–5.11)
RDW: 15.8 % — ABNORMAL HIGH (ref 11.5–15.5)
WBC: 7.2 10*3/uL (ref 4.0–10.5)

## 2024-02-28 LAB — VITAMIN B12: Vitamin B-12: 153 pg/mL — ABNORMAL LOW (ref 211–911)

## 2024-02-28 LAB — TSH: TSH: 1 u[IU]/mL (ref 0.35–5.50)

## 2024-02-28 MED ORDER — CARBAMAZEPINE 200 MG PO TABS: 200.0000 mg | ORAL_TABLET | Freq: Two times a day (BID) | ORAL | Status: DC

## 2024-02-28 MED ORDER — CARBAMAZEPINE 200 MG PO TABS: 200.0000 mg | ORAL_TABLET | Freq: Two times a day (BID) | ORAL | 6 refills | Status: DC

## 2024-02-28 NOTE — Patient Instructions (Addendum)
  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

## 2024-02-28 NOTE — Progress Notes (Signed)
 [facility]  PATIENT: [name] DOB: [birth year]  REFERRING CLINICIAN: Helaine Llanos, MD HISTORY FROM: patient  REASON FOR VISIT: follow up   HISTORICAL  CHIEF COMPLAINT:  Chief Complaint  Patient presents with   Follow-up    RM 7 Pt alone. Pt wants to discuss MRI results and labs. Pt states still in pain.     HISTORY OF PRESENT ILLNESS:   UPDATE (02/28/24, VRP): Since last visit, doing about the same. Symptoms are left facial pain and intermittent shock like sensations in left head. Indomethacin  did not help.    PRIOR HPI (12/16/23, VRP): 58 year old female here for evaluation of left sided headache and pain.  August 2024 patient had left mandibular crown replacement due to concern of underlying caries.  Patient did not have pain prior to this procedure.  Within a few days she started to develop pain in the left side of her head, face, eye, ear, throat.  Left tongue felt a burning sensation.  Initially symptoms were quite severe and consistent.  She had left eye watering and left nasal drainage as well.  Symptoms have been consistent since August and unremitting.  In the last few weeks symptoms have slightly improved.   REVIEW OF SYSTEMS: Full 14 system review of systems performed and negative with exception of: as per HPI.  ALLERGIES: Allergies  Allergen Reactions   Meperidine  Anaphylaxis, Itching and Swelling    REACTION: swelling   Cymbalta  [Duloxetine  Hcl] Hives    Generic makes her feel like she is crawling out of her skin   Diazepam Hives    REACTION: swelling   Other     Axe Cologne   Shellfish Allergy Hives    HOME MEDICATIONS: Outpatient Medications Prior to Visit  Medication Sig Dispense Refill   albuterol  (VENTOLIN  HFA) 108 (90 Base) MCG/ACT inhaler Inhale 2 puffs into the lungs every 6 (six) hours as needed for wheezing or shortness of breath. 18 g 1   aspirin  EC 81 MG tablet Take 1 tablet (81 mg total) by mouth daily.  Swallow whole.     budesonide -formoterol  (SYMBICORT ) 160-4.5 MCG/ACT inhaler Inhale 2 puffs into the lungs daily. 1 each 12   CYMBALTA  60 MG capsule Take 60 mg by mouth daily.     DULoxetine  (CYMBALTA ) 30 MG capsule Take 1 capsule (30 mg total) by mouth daily. 90 capsule 3   empagliflozin  (JARDIANCE ) 10 MG TABS tablet Take 1 tablet (10 mg total) by mouth daily before breakfast. 30 tablet 11   fluconazole  (DIFLUCAN ) 150 MG tablet Take 1 tablet (150 mg total) by mouth 2 (two) times a week. 8 tablet 3   fluticasone  (FLONASE ) 50 MCG/ACT nasal spray Place 2 sprays into both nostrils daily. 16 g 6   HYDROcodone -acetaminophen  (NORCO/VICODIN) 5-325 MG tablet Take 2 tablets by mouth 2 (two) times daily as needed for moderate pain (pain score 4-6). 120 tablet 0   ibuprofen  (ADVIL ) 600 MG tablet Take 1 tablet (600 mg total) by mouth every 8 (eight) hours as needed for mild pain (pain score 1-3). 90 tablet 5   loratadine (CLARITIN) 10 MG tablet Take 10 mg by mouth daily.     metFORMIN  (GLUCOPHAGE -XR) 500 MG 24 hr tablet TAKE 2 TABLETS BY MOUTH EVERY DAY WITH BREAKFAST 180 tablet 3   montelukast  (SINGULAIR ) 10 MG tablet TAKE 1 TABLET BY MOUTH EVERYDAY AT BEDTIME 90 tablet 3   olmesartan (BENICAR) 40 MG tablet TAKE 1 TABLET BY MOUTH EVERY DAY 90 tablet 3  omeprazole (PRILOSEC) 40 MG capsule Take 40 mg by mouth 2 (two) times daily.     rosuvastatin  (CRESTOR ) 20 MG tablet Take 1 tablet (20 mg total) by mouth daily. 90 tablet 0   tiZANidine  (ZANAFLEX ) 4 MG tablet Take 1 tablet (4 mg total) by mouth every 6 (six) hours as needed for muscle spasms. 120 tablet 3   topiramate  (TOPAMAX ) 100 MG tablet TAKE 1 TABLET BY MOUTH EVERY MORNING AND TAKE 2 TABLETS BY MOUTH EVERY EVENING 270 tablet 1   traMADol  (ULTRAM ) 50 MG tablet TAKE 1 TABLET BY MOUTH TWICE A DAY. DIRECTION CHANGE. 60 tablet 2   traMADol  (ULTRAM -ER) 100 MG 24 hr tablet Take 1 tablet (100 mg total) by mouth daily. 30 tablet 2   traZODone  (DESYREL ) 50 MG  tablet TAKE 1 AND 1/2 TABLETS BY MOUTH AT BEDTIME 135 tablet 3   No facility-administered medications prior to visit.    PAST MEDICAL HISTORY: Past Medical History:  Diagnosis Date   Allergy    Anxiety    Arthritis    "just the norm" (01/24/2013)   ASCVD (arteriosclerotic cardiovascular disease)    Asthma    Celiac disease    Daily headache    "depends on the season" (01/24/2013)   Diabetes mellitus without complication (HCC)    Fibromyalgia    GERD (gastroesophageal reflux disease)    History of colonic polyps    Hyperlipidemia    Hypertension    Migraines    Obesity (BMI 30.0-34.9)    RSD (reflex sympathetic dystrophy)    Sleep apnea    NO MACHINE RECOMMENDED   TIA (transient ischemic attack) ~ 2009    PAST SURGICAL HISTORY: Past Surgical History:  Procedure Laterality Date   APPENDECTOMY  ~ 06/2007   BLADDER REPAIR  ~ 06/2007   "same day after bladder lift" (01/24/2013)   BLADDER SUSPENSION  ~ 06/2007   BREAST BIOPSY Left    DIAGNOSTIC LAPAROSCOPY  1990's & ~ 2000   "I've had a couple; for endometrosis" (01/24/2013)   HERNIA REPAIR     INCISIONAL HERNIA REPAIR N/A 01/24/2013   Procedure: LAPAROSCOPIC INCISIONAL HERNIA;  Surgeon: Rogena Class, MD;  Location: [facility] OR;  Service: General;  Laterality: N/A;   INSERTION OF MESH N/A 01/24/2013   Procedure: INSERTION OF MESH;  Surgeon: Rogena Class, MD;  Location: [facility] OR;  Service: General;  Laterality: N/A;   LAPAROSCOPIC INCISIONAL / UMBILICAL / VENTRAL HERNIA REPAIR  01/24/2013   IHR w/mesh/notes   NASAL SEPTUM SURGERY  1980's?   OOPHORECTOMY Right 2009   TONSILLECTOMY  1990's   VAGINAL HYSTERECTOMY  ` 06/2007    FAMILY HISTORY: Family History  Problem Relation Age of Onset   Cancer Mother        lung   Hypertension Mother    Cancer Father        colon   Hypertension Sister    Cancer Sister    Cancer Brother    Hyperlipidemia Sister    Birth defects Maternal Grandmother        breast   Breast cancer Maternal  Grandmother    Birth defects Paternal Grandmother        uterine, stomach, lung   Breast cancer Paternal Grandmother    Breast cancer Paternal Aunt    Heart attack Paternal Grandfather    Heart failure Paternal Grandfather     SOCIAL HISTORY: Social History   Socioeconomic History   Marital status: Married    Spouse  name: Not on file   Number of children: 2   Years of education: Not on file   Highest education level: Not on file  Occupational History   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  Tobacco Use   Smoking status: Never    Passive exposure: Current   Smokeless tobacco: Never  Vaping Use   Vaping status: Never Used  Substance and Sexual Activity   Alcohol  use: Not Currently    Comment: 01/24/2013 "drink or 2 once or /twice/year, if that"   Drug use: No   Sexual activity: Yes    Birth control/protection: None  Other Topics Concern   Not on file  Social History Narrative   No living will   Would want husband as POA--then sister, Bernardo Bridgeman   Would accept resuscitation attempts   Probably would not want tube feeds if cognitively unaware   Pt lives with family    Pt doesn't work    Social Drivers of Corporate investment banker Strain: Not on Ship broker Insecurity: Not on file  Transportation Needs: Not on file  Physical Activity: Not on file  Stress: Not on file  Social Connections: Not on file  Intimate Partner Violence: Not on file     PHYSICAL EXAM  GENERAL EXAM/CONSTITUTIONAL: Vitals:  Vitals:   02/28/24 1354  BP: 120/80  Pulse: 96  Weight: 166 lb (75.3 kg)  Height: 5\' 7"  (1.702 m)   Body mass index is 26 kg/m. Wt Readings from Last 3 Encounters:  02/28/24 166 lb (75.3 kg)  02/27/24 166 lb (75.3 kg)  02/22/24 164 lb 12.8 oz (74.8 kg)   Patient is in no distress; well developed, nourished and groomed; neck is supple  CARDIOVASCULAR: Examination of carotid arteries is normal; no carotid bruits Regular rate and rhythm, no murmurs Examination of  peripheral vascular system by observation and palpation is normal  EYES: Ophthalmoscopic exam of optic discs and posterior segments is normal; no papilledema or hemorrhages No results found.  MUSCULOSKELETAL: Gait, strength, tone, movements noted in Neurologic exam below  NEUROLOGIC: MENTAL STATUS:      No data to display         awake, alert, oriented to person, place and time recent and remote memory intact normal attention and concentration language fluent, comprehension intact, naming intact fund of knowledge appropriate  CRANIAL NERVE:  2nd - no papilledema on fundoscopic exam 2nd, 3rd, 4th, 6th - pupils equal and reactive to light, visual fields full to confrontation, extraocular muscles intact, no nystagmus 5th - facial sensation symmetric 7th - facial strength symmetric 8th - hearing intact 9th - palate elevates symmetrically, uvula midline 11th - shoulder shrug symmetric 12th - tongue protrusion midline  MOTOR:  normal bulk and tone, full strength in the BUE, BLE  SENSORY:  normal and symmetric to light touch, temperature, vibration  COORDINATION:  finger-nose-finger, fine finger movements normal  REFLEXES:  deep tendon reflexes TRACE and symmetric  GAIT/STATION:  narrow based gait     DIAGNOSTIC DATA (LABS, IMAGING, TESTING) - I reviewed patient records, labs, notes, testing and imaging myself where available.  Lab Results  Component Value Date   WBC 7.2 02/27/2024   HGB 13.1 02/27/2024   HCT 40.7 02/27/2024   MCV 86.5 02/27/2024   PLT 266.0 02/27/2024      Component Value Date/Time   NA 140 02/27/2024 1505   NA 144 12/05/2023 1453   K 4.3 02/27/2024 1505   CL 108 02/27/2024 1505   CO2  24 02/27/2024 1505   GLUCOSE 102 (H) 02/27/2024 1505   BUN 21 02/27/2024 1505   BUN 19 12/05/2023 1453   CREATININE 0.86 02/27/2024 1505   CREATININE 1.00 07/01/2017 1132   CALCIUM  9.3 02/27/2024 1505   PROT 6.6 02/27/2024 1505   ALBUMIN 4.6  02/27/2024 1505   AST 11 02/27/2024 1505   ALT 10 02/27/2024 1505   ALKPHOS 69 02/27/2024 1505   BILITOT 0.3 02/27/2024 1505   GFRNONAA >60 02/09/2021 1618   GFRAA >90 01/16/2013 1039   Lab Results  Component Value Date   CHOL 196 06/27/2023   HDL 40.50 06/27/2023   LDLCALC 92 02/28/2009   LDLDIRECT 116.0 06/27/2023   TRIG (H) 06/27/2023    446.0 Triglyceride is over 400; calculations on Lipids are invalid.   CHOLHDL 5 06/27/2023   Lab Results  Component Value Date   HGBA1C 6.6 (A) 02/27/2024   Lab Results  Component Value Date   VITAMINB12 153 (L) 02/27/2024   Lab Results  Component Value Date   TSH 1.00 02/27/2024    3/254/25 MRI brain - left inferior cerebellar infarct - mild chronic small vessel ischemic disease   12/16/23 CT maxillofacial - Normal maxillofacial CT.   01/23/24 CTA head / neck -No large vessel occlusion. - No CT evidence of acute intracranial abnormality. - Small remote infarct in the posteroinferior left cerebellum. - Multifocal intracranial arterial stenoses. Moderate stenosis of the right paraclinoid ICA. Moderate to severe stenosis of a distal M2/M3 branch of the right MCA. Multifocal severe stenosis of M2 superior division branches of the left MCA. - Small left mastoid effusion.  02/17/24 TTE  1. Left ventricular ejection fraction, by estimation, is 60 to 65%. Left  ventricular ejection fraction by 3D volume is 58 %. The left ventricle has  normal function. The left ventricle has no regional wall motion  abnormalities. Left ventricular diastolic   parameters are consistent with Grade I diastolic dysfunction (impaired  relaxation).   2. Right ventricular systolic function is normal. The right ventricular  size is normal.   3. Agitated saline contrast bubble study was negative, with no evidence  of any interatrial shunt.   4. The mitral valve is normal in structure. Trivial mitral valve  regurgitation. No evidence of mitral stenosis.   5.  The aortic valve is normal in structure. Aortic valve regurgitation is  not visualized. No aortic stenosis is present.   6. Aortic dilatation noted. There is mild dilatation of the ascending  aorta, measuring 42 mm.   7. The inferior vena cava is normal in size with greater than 50%  respiratory variability, suggesting right atrial pressure of 3 mmHg.   02/24/24 cardiac monitor Average heart rate 83, minimum heart rate 54, max heart rate 141. No atrial fibrillation or atrial flutter. No sustained arrhythmias. Patient triggered events associated with sinus rhythm and rare PVCs.   ASSESSMENT AND PLAN  58 y.o. year old female here with new onset constant left-sided headache, pain, with automatic features including lacrimation and nasal drainage, since August 2024.  Meds tried: gabapentin , indomethacin   Dx:  1. 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; left sided headache, left face, eye, ear, pain; lacrimation and nasal drainage; constant since Aug 2024, starting a few days after dental procedure --> left mandibular crown replacement) - continue hydrocodone , tramadol , topiramate , trazodone , duloxetine  (per PMR for CRPS) - consider carbamazepine  200mg  at bedtime; after  1-2 weeks increase to twice a day   SILENT CEREBELLAR INFARCT + INTRACRANIAL ATHEROSCLEROSIS + TIA (~2009; bilateral vision loss and left sided tingling x hours) - aspirin  81mg  daily, rosuvastatin , DM and BP control  Meds ordered this encounter  Medications   DISCONTD: carbamazepine  (TEGRETOL ) 200 MG tablet    Sig: Take 1 tablet (200 mg total) by mouth in the morning and at bedtime.   carbamazepine  (TEGRETOL ) 200 MG tablet    Sig: Take 1 tablet (200 mg total) by mouth in the morning and at bedtime.    Dispense:  60 tablet    Refill:  6   Return in about 6 months (around 08/30/2024) for MyChart visit (15 min).    Omega Bible, MD 02/28/2024, 2:33 PM Certified in Neurology, Neurophysiology and Neuroimaging  [facility] [address], [address] [place], [place] [address] [phone]

## 2024-02-29 ENCOUNTER — Other Ambulatory Visit: Payer: Self-pay

## 2024-02-29 ENCOUNTER — Ambulatory Visit (INDEPENDENT_AMBULATORY_CARE_PROVIDER_SITE_OTHER)

## 2024-02-29 DIAGNOSIS — E538 Deficiency of other specified B group vitamins: Secondary | ICD-10-CM | POA: Diagnosis not present

## 2024-02-29 MED ORDER — CYANOCOBALAMIN 1000 MCG/ML IJ SOLN
1000.0000 ug | Freq: Once | INTRAMUSCULAR | Status: AC
  Administered 2024-02-29: 1000 ug via INTRAMUSCULAR

## 2024-02-29 NOTE — Progress Notes (Signed)
 Per orders of Dr. Joelle Musca, 1st injection of monthly B12 1000 mcg/ml given by Franne Ivory, CMA in Right Deltoid.  Patient tolerated injection well.  Sending to Dr Geralyn Knee in Dr Alger Infield absence this afternoon.

## 2024-03-01 ENCOUNTER — Telehealth: Payer: Self-pay | Admitting: Registered Nurse

## 2024-03-01 ENCOUNTER — Ambulatory Visit: Payer: Self-pay | Admitting: Cardiology

## 2024-03-01 MED ORDER — HYDROCODONE-ACETAMINOPHEN 10-325 MG PO TABS: 1.0000 | ORAL_TABLET | Freq: Every day | ORAL | 0 refills | Status: DC | PRN

## 2024-03-01 NOTE — Telephone Encounter (Signed)
 Call placed to [name]  as requested, she reports Dr Salli Crawley prescribed Carbamazepine , and she received a call from the pharmacist reporting it interacts with her Tramadol . Call placed to [facility], they were on lunch break. Will call [facility] this afternoon. [name] is aware of the above and verbalizes understanding.

## 2024-03-01 NOTE — Telephone Encounter (Signed)
 Call placed to [facility]: Spoke with Fabio Holts the pharmacist, regarding [name] call regarding Tegretol  and Tramadol . Interaction due to Serotonin Syndrome.  Tramadol  will be discontinued on 03/04/2024 Hydrocodone  will be increased on 05/18/to Hydrocodone  10/325 5 times a day as needed for pain.  Then she will start the Tegretol  on 03/05/2024, per Pharmacist recommendation. [name] verbalizes understanding.  Tramadol  and Hydrocodone  prescriptions were removed from her profile. New Hydrocodone  prescription sent to pharmacist.

## 2024-03-06 ENCOUNTER — Ambulatory Visit: Admitting: Obstetrics & Gynecology

## 2024-03-06 ENCOUNTER — Other Ambulatory Visit: Payer: Self-pay

## 2024-03-06 ENCOUNTER — Encounter: Payer: Self-pay | Admitting: Obstetrics & Gynecology

## 2024-03-06 VITALS — BP 119/82 | HR 92 | Ht 67.0 in | Wt 165.0 lb

## 2024-03-06 DIAGNOSIS — N941 Unspecified dyspareunia: Secondary | ICD-10-CM

## 2024-03-06 DIAGNOSIS — N905 Atrophy of vulva: Secondary | ICD-10-CM | POA: Diagnosis not present

## 2024-03-06 DIAGNOSIS — R1900 Intra-abdominal and pelvic swelling, mass and lump, unspecified site: Secondary | ICD-10-CM | POA: Diagnosis not present

## 2024-03-06 MED ORDER — ROSUVASTATIN CALCIUM 20 MG PO TABS: 20.0000 mg | ORAL_TABLET | Freq: Every day | ORAL | 0 refills | Status: DC

## 2024-03-06 NOTE — Progress Notes (Signed)
    GYNECOLOGY PROGRESS NOTE  Subjective:    Patient ID: [name], female    DOB: 1966-01-22, 58 y.o.   MRN: [id number]  HPI  Patient is a 58 y.o. married [id number] here as a new patient for long standing dyspareunia. I saw her in 2013 for deep dyspareunia. She did not get the ordered ultrasound or follow up. She did have a hysterectomy for benign indications in the past followed by a unilateral oopherectomy a year later. She has not seen a gynecologist since 2015. She reports that now in addition to deep dyspareunia she also has insertional pain due to atrophy since menopause. She still suffers with night sweats. She has had sex about 2 times in the last year.  The following portions of the patient's history were reviewed and updated as appropriate: allergies, current medications, past family history, past medical history, past social history, past surgical history, and problem list.  Review of Systems Pertinent items are noted in HPI.   Objective:   Blood pressure 119/82, pulse 92, height 5\' 7"  (1.702 m), weight 165 lb (74.8 kg). Body mass index is 25.84 kg/m. Well nourished, well hydrated White female, no apparent distress She is ambulating and conversing normally. EG- moderate VVA Pederson speculum used and vagina looks atrophic but otherwise normal.The vault is well supported. Bimanual exam reveals a distinct mobile LLQ mass, non-tender, about 5 cm   Assessment:   VVA Dyspareunia LLQ mass  Plan:  Pelvic ultrasound prior to any treatments with estrogen for VVA

## 2024-03-15 ENCOUNTER — Ambulatory Visit
Admission: RE | Admit: 2024-03-15 | Discharge: 2024-03-15 | Disposition: A | Discharge status: Home / Self Care | Source: Ambulatory Visit | Attending: Obstetrics & Gynecology | Admitting: Obstetrics & Gynecology

## 2024-03-15 DIAGNOSIS — N941 Unspecified dyspareunia: Secondary | ICD-10-CM | POA: Diagnosis present

## 2024-03-15 DIAGNOSIS — R1900 Intra-abdominal and pelvic swelling, mass and lump, unspecified site: Secondary | ICD-10-CM | POA: Insufficient documentation

## 2024-03-15 DIAGNOSIS — R1904 Left lower quadrant abdominal swelling, mass and lump: Secondary | ICD-10-CM | POA: Diagnosis not present

## 2024-03-17 DIAGNOSIS — E119 Type 2 diabetes mellitus without complications: Secondary | ICD-10-CM | POA: Diagnosis not present

## 2024-03-17 DIAGNOSIS — I10 Essential (primary) hypertension: Secondary | ICD-10-CM | POA: Diagnosis not present

## 2024-03-24 ENCOUNTER — Other Ambulatory Visit (INDEPENDENT_AMBULATORY_CARE_PROVIDER_SITE_OTHER): Payer: Self-pay | Admitting: Otolaryngology

## 2024-04-02 ENCOUNTER — Encounter: Payer: Self-pay | Admitting: Internal Medicine

## 2024-04-02 ENCOUNTER — Ambulatory Visit (INDEPENDENT_AMBULATORY_CARE_PROVIDER_SITE_OTHER): Admitting: Obstetrics & Gynecology

## 2024-04-02 VITALS — BP 126/75 | HR 91 | Ht 67.0 in | Wt 165.4 lb

## 2024-04-02 DIAGNOSIS — N941 Unspecified dyspareunia: Secondary | ICD-10-CM

## 2024-04-02 DIAGNOSIS — N905 Atrophy of vulva: Secondary | ICD-10-CM | POA: Diagnosis not present

## 2024-04-02 MED ORDER — ESTRADIOL 0.1 MG/GM VA CREA: TOPICAL_CREAM | VAGINAL | 12 refills | Status: DC

## 2024-04-02 NOTE — Progress Notes (Signed)
    GYNECOLOGY PROGRESS NOTE  Subjective:    Patient ID: [name], female    DOB: [birth year], 58 y.o.   MRN: [id number]  HPI  Patient is a 58 y.o. [id number]   The following portions of the patient's history were reviewed and updated as appropriate: allergies, current medications, past family history, past medical history, past social history, past surgical history, and problem list.  Review of Systems Pertinent items are noted in HPI.   Objective:   Blood pressure 126/75, pulse 91, height 5' 7 (1.702 m), weight 165 lb 6.4 oz (75 kg). Body mass index is 25.91 kg/m.    Assessment:   Dyspareunia VVA  Plan:  Start with vaginal estrogen cream 2 nights per week If her dyspareunia continues after a couple of months of estrogen treatment, then she can try pelvic PT prn. She says that she would like to have her remaining (left) ovary removed. She has ovarian, prostate, and melanoma cancers on her father's side. She had negative genetic testing.  I am unable to see her op note with Dr. Shira Dopp but the patient reports that Dr. Shira Dopp told her that she was unable to remove the left ovary because it was stuck to my bladder.   I explained that I no longer operate here and that if she desires surgical intervention, then she will need to see a different doctor.

## 2024-04-03 DIAGNOSIS — R110 Nausea: Secondary | ICD-10-CM | POA: Diagnosis not present

## 2024-04-03 DIAGNOSIS — Z8719 Personal history of other diseases of the digestive system: Secondary | ICD-10-CM | POA: Diagnosis not present

## 2024-04-03 MED ORDER — TRIAMCINOLONE ACETONIDE 0.1 % EX CREA: 1.0000 | TOPICAL_CREAM | Freq: Two times a day (BID) | CUTANEOUS | 1 refills | Status: DC | PRN

## 2024-04-03 NOTE — Telephone Encounter (Signed)
 Called pt to offer OV tomorrow and we can cancel her nurse visit and give her the shot at the OV. She said the rash is a lot better this morning. She does not think she needs an OV right now.

## 2024-04-03 NOTE — Addendum Note (Signed)
 Addended by: Curt Dover I on: 04/03/2024 01:26 PM   Modules accepted: Orders

## 2024-04-04 ENCOUNTER — Ambulatory Visit (INDEPENDENT_AMBULATORY_CARE_PROVIDER_SITE_OTHER)

## 2024-04-04 DIAGNOSIS — E538 Deficiency of other specified B group vitamins: Secondary | ICD-10-CM | POA: Diagnosis not present

## 2024-04-04 MED ORDER — CYANOCOBALAMIN 1000 MCG/ML IJ SOLN
1000.0000 ug | Freq: Once | INTRAMUSCULAR | Status: AC
  Administered 2024-04-04: 1000 ug via INTRAMUSCULAR

## 2024-04-04 NOTE — Progress Notes (Signed)
 Per orders of Dr. Letvak, injection of monthly B12 1000 mcg/ml given by Franne Ivory, CMA in Left Deltoid. Patient tolerated injection well.  Sending to Margarie Shay, NP in Dr Alger Infield absence.

## 2024-04-06 ENCOUNTER — Encounter: Payer: Self-pay | Admitting: Internal Medicine

## 2024-04-06 ENCOUNTER — Ambulatory Visit (INDEPENDENT_AMBULATORY_CARE_PROVIDER_SITE_OTHER): Admitting: Internal Medicine

## 2024-04-06 VITALS — BP 112/74 | HR 74 | Temp 98.4°F | Ht 67.0 in | Wt 165.0 lb

## 2024-04-06 DIAGNOSIS — R21 Rash and other nonspecific skin eruption: Secondary | ICD-10-CM | POA: Diagnosis not present

## 2024-04-06 NOTE — Assessment & Plan Note (Signed)
 I am concerned about rash to carbamazepine --but reassured this is not hypersensitivity type rash or Steven's-Johnson. Will try to get her in for check at dermatology---and hold off on stopping the med for now (since it is helping) Continue aquaphor, Dove soap and TAC for the itching

## 2024-04-06 NOTE — Progress Notes (Signed)
 Subjective:    Patient ID: [name], female    DOB: [birth year], 58 y.o.   MRN: [id number]  HPI Here due to rash  Started with rash on right arm about 5 days ago Then also on left arm and legs Will get darker and lighter--but doesn't go away Triamcinolone reduced itching but didn't get rid of the rash  No outdoor exposures No new skin products/soaps, etc Only new medication is carbamazepine  about a month ago (has reduced the electric shock feeling)  Current Outpatient Medications on File Prior to Visit  Medication Sig Dispense Refill   albuterol  (VENTOLIN  HFA) 108 (90 Base) MCG/ACT inhaler Inhale 2 puffs into the lungs every 6 (six) hours as needed for wheezing or shortness of breath. 18 g 1   aspirin  EC 81 MG tablet Take 1 tablet (81 mg total) by mouth daily. Swallow whole.     budesonide -formoterol  (SYMBICORT ) 160-4.5 MCG/ACT inhaler Inhale 2 puffs into the lungs daily. 1 each 12   carbamazepine  (TEGRETOL ) 200 MG tablet Take 1 tablet (200 mg total) by mouth in the morning and at bedtime. 60 tablet 6   cyanocobalamin  (VITAMIN B12) 1000 MCG/ML injection Inject 1,000 mcg into the muscle every 30 (thirty) days.     CYMBALTA  60 MG capsule Take 60 mg by mouth daily.     DULoxetine  (CYMBALTA ) 30 MG capsule Take 1 capsule (30 mg total) by mouth daily. 90 capsule 3   empagliflozin  (JARDIANCE ) 10 MG TABS tablet Take 1 tablet (10 mg total) by mouth daily before breakfast. 30 tablet 11   estradiol (ESTRACE VAGINAL) 0.1 MG/GM vaginal cream Apply to vulva/vagina at bedtime 2 nights per week. 42.5 g 12   fluconazole  (DIFLUCAN ) 150 MG tablet Take 1 tablet (150 mg total) by mouth 2 (two) times a week. 8 tablet 3   fluticasone  (FLONASE ) 50 MCG/ACT nasal spray SPRAY 2 SPRAYS INTO EACH NOSTRIL EVERY DAY 48 mL 2   HYDROcodone -acetaminophen  (NORCO) 10-325 MG tablet Take 1 tablet by mouth 5 (five) times daily as needed. 120 tablet 0   ibuprofen  (ADVIL ) 600 MG tablet Take 1 tablet (600 mg  total) by mouth every 8 (eight) hours as needed for mild pain (pain score 1-3). 90 tablet 5   loratadine (CLARITIN) 10 MG tablet Take 10 mg by mouth daily.     metFORMIN  (GLUCOPHAGE -XR) 500 MG 24 hr tablet TAKE 2 TABLETS BY MOUTH EVERY DAY WITH BREAKFAST 180 tablet 3   montelukast  (SINGULAIR ) 10 MG tablet TAKE 1 TABLET BY MOUTH EVERYDAY AT BEDTIME 90 tablet 3   olmesartan (BENICAR) 40 MG tablet TAKE 1 TABLET BY MOUTH EVERY DAY 90 tablet 3   omeprazole (PRILOSEC) 40 MG capsule Take 40 mg by mouth 2 (two) times daily.     rosuvastatin  (CRESTOR ) 20 MG tablet Take 1 tablet (20 mg total) by mouth daily. 90 tablet 0   tiZANidine  (ZANAFLEX ) 4 MG tablet Take 1 tablet (4 mg total) by mouth every 6 (six) hours as needed for muscle spasms. 120 tablet 3   topiramate  (TOPAMAX ) 100 MG tablet TAKE 1 TABLET BY MOUTH EVERY MORNING AND TAKE 2 TABLETS BY MOUTH EVERY EVENING 270 tablet 1   traZODone  (DESYREL ) 50 MG tablet TAKE 1 AND 1/2 TABLETS BY MOUTH AT BEDTIME 135 tablet 3   triamcinolone cream (KENALOG) 0.1 % Apply 1 Application topically 2 (two) times daily as needed. 45 g 1   [DISCONTINUED] losartan  (COZAAR ) 100 MG tablet TAKE 1 TABLET BY MOUTH EVERY DAY 90 tablet  0   No current facility-administered medications on file prior to visit.    Allergies  Allergen Reactions   Meperidine  Anaphylaxis, Itching and Swelling    REACTION: swelling   Cymbalta  [Duloxetine  Hcl] Hives    Generic makes her feel like she is crawling out of her skin   Diazepam Hives    REACTION: swelling   Other     Axe Cologne   Shellfish Allergy Hives    Past Medical History:  Diagnosis Date   Allergy    Anxiety    Arthritis    just the norm (01/24/2013)   ASCVD (arteriosclerotic cardiovascular disease)    Asthma    Celiac disease    Daily headache    depends on the season (01/24/2013)   Diabetes mellitus without complication (HCC)    Fibromyalgia    GERD (gastroesophageal reflux disease)    History of colonic polyps     Hyperlipidemia    Hypertension    Migraines    Obesity (BMI 30.0-34.9)    RSD (reflex sympathetic dystrophy)    Sleep apnea    NO MACHINE RECOMMENDED   TIA (transient ischemic attack) ~ 2009    Past Surgical History:  Procedure Laterality Date   APPENDECTOMY  ~ 06/2007   BLADDER REPAIR  ~ 06/2007   same day after bladder lift (01/24/2013)   BLADDER SUSPENSION  ~ 06/2007   BREAST BIOPSY Left    DIAGNOSTIC LAPAROSCOPY  1990's & ~ 2000   I've had a couple; for endometrosis (01/24/2013)   HERNIA REPAIR     INCISIONAL HERNIA REPAIR N/A 01/24/2013   Procedure: LAPAROSCOPIC INCISIONAL HERNIA;  Surgeon: Rogena Class, MD;  Location: [facility] OR;  Service: General;  Laterality: N/A;   INSERTION OF MESH N/A 01/24/2013   Procedure: INSERTION OF MESH;  Surgeon: Rogena Class, MD;  Location: [facility] OR;  Service: General;  Laterality: N/A;   LAPAROSCOPIC INCISIONAL / UMBILICAL / VENTRAL HERNIA REPAIR  01/24/2013   IHR w/mesh/notes   NASAL SEPTUM SURGERY  1980's?   OOPHORECTOMY Right 2009   TONSILLECTOMY  1990's   VAGINAL HYSTERECTOMY  ` 06/2007    Family History  Problem Relation Age of Onset   Cancer Mother        lung   Hypertension Mother    Cancer Father        colon   Hypertension Sister    Cancer Sister    Cancer Brother    Hyperlipidemia Sister    Birth defects Maternal Grandmother        breast   Breast cancer Maternal Grandmother    Birth defects Paternal Grandmother        uterine, stomach, lung   Breast cancer Paternal Grandmother    Breast cancer Paternal Aunt    Heart attack Paternal Grandfather    Heart failure Paternal Grandfather     Social History   Socioeconomic History   Marital status: Married    Spouse name: Not on file   Number of children: 2   Years of education: Not on file   Highest education level: Not on file  Occupational History   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  Tobacco Use   Smoking status: Never    Passive exposure: Current   Smokeless  tobacco: Never  Vaping Use   Vaping status: Never Used  Substance and Sexual Activity   Alcohol  use: Not Currently    Comment: 01/24/2013 drink or 2 once or /twice/year, if that  Drug use: No   Sexual activity: Yes    Birth control/protection: None  Other Topics Concern   Not on file  Social History Narrative   No living will   Would want husband as POA--then sister, Bernardo Bridgeman   Would accept resuscitation attempts   Probably would not want tube feeds if cognitively unaware   Pt lives with family    Pt doesn't work    Social Drivers of Corporate investment banker Strain: Not on Ship broker Insecurity: Not on file  Transportation Needs: Not on file  Physical Activity: Not on file  Stress: Not on file  Social Connections: Not on file  Intimate Partner Violence: Not on file   Review of Systems No fever Did feel bad for a week or so before the rash came out---this has improved some     Objective:   Physical Exam Constitutional:      Appearance: Normal appearance.   Skin:    Comments: Lichenified rash on right forearm and some on elbows (almost looks psoriaform) Some more scattered papular lesions on thighs   Neurological:     Mental Status: She is alert.            Assessment & Plan:

## 2024-04-08 ENCOUNTER — Other Ambulatory Visit: Payer: Self-pay | Admitting: Registered Nurse

## 2024-04-10 ENCOUNTER — Encounter: Payer: Self-pay | Admitting: Internal Medicine

## 2024-04-10 NOTE — Telephone Encounter (Signed)
 Call placed to [name],  She reports allergic reaction to the duloxetine , she can take the Brand Name only Cymbalta .  Prescription sent to pharmacy, she verbalizes understanding.

## 2024-04-11 DIAGNOSIS — L309 Dermatitis, unspecified: Secondary | ICD-10-CM | POA: Diagnosis not present

## 2024-04-11 DIAGNOSIS — L308 Other specified dermatitis: Secondary | ICD-10-CM | POA: Diagnosis not present

## 2024-04-12 ENCOUNTER — Encounter: Payer: Self-pay | Admitting: Registered Nurse

## 2024-04-12 ENCOUNTER — Encounter: Payer: Worker's Compensation | Attending: Registered Nurse | Admitting: Registered Nurse

## 2024-04-12 ENCOUNTER — Encounter: Payer: Self-pay | Admitting: Diagnostic Neuroimaging

## 2024-04-12 VITALS — BP 124/74 | HR 86 | Ht 67.0 in | Wt 166.0 lb

## 2024-04-12 DIAGNOSIS — G90512 Complex regional pain syndrome I of left upper limb: Secondary | ICD-10-CM | POA: Insufficient documentation

## 2024-04-12 DIAGNOSIS — F3289 Other specified depressive episodes: Secondary | ICD-10-CM | POA: Diagnosis not present

## 2024-04-12 DIAGNOSIS — Z79891 Long term (current) use of opiate analgesic: Secondary | ICD-10-CM | POA: Diagnosis present

## 2024-04-12 DIAGNOSIS — G894 Chronic pain syndrome: Secondary | ICD-10-CM | POA: Insufficient documentation

## 2024-04-12 DIAGNOSIS — Z5181 Encounter for therapeutic drug level monitoring: Secondary | ICD-10-CM | POA: Diagnosis present

## 2024-04-12 DIAGNOSIS — G4700 Insomnia, unspecified: Secondary | ICD-10-CM | POA: Insufficient documentation

## 2024-04-12 MED ORDER — BUPRENORPHINE 5 MCG/HR TD PTWK: 1.0000 | MEDICATED_PATCH | TRANSDERMAL | 0 refills | Status: DC

## 2024-04-12 MED ORDER — HYDROCODONE-ACETAMINOPHEN 10-325 MG PO TABS: 1.0000 | ORAL_TABLET | Freq: Every day | ORAL | 0 refills | Status: DC | PRN

## 2024-04-12 NOTE — Progress Notes (Signed)
 Subjective:    Patient ID: [name], female    DOB: January 22, 1966, 58 y.o.   MRN: [id number]  HPI: [name] is a 58 y.o. female who returns for follow up appointment for chronic pain and medication refill. She states her pain is in her left arm. She  rates her pain 9. Her current exercise regime is walking short distances.   [name] was diagnosed with  Trigeminal Neuralgia, neurology following. She was prescribed Tegretol  it is contraindicated with her Tramadol . Tramadol  was discontinued and Hydrocodone  dose was increased. [name] only receiving 3 hours of relief with current medication regimen.  Butrans 5 MCG/hr ordered today, she will send a My- Chart or call office in a week with update on medication change. She verbalizes understanding.   [name] also reports she developed a rash on her upper and lower extremities, she was seen by Dermatologist. She reports her tegretol  was placed on hold, she was instructed to call Dr Margaret regarding the above. She verbalizes understanding.   [name] Morphine  equivalent is 50.00 MME.   Last Oral Swab was Performed on 01/02/2024,it was consistent.      Pain Inventory Average Pain 8 Pain Right Now 9 My pain is intermittent, constant, sharp, burning, dull, stabbing, tingling, and aching  In the last 24 hours, has pain interfered with the following? General activity 2 Relation with others 2 Enjoyment of life 2 What TIME of day is your pain at its worst? morning , daytime, evening, and night Sleep (in general) Poor  Pain is worse with: walking, bending, sitting, inactivity, standing, and some activites Pain improves with: rest, therapy/exercise, pacing activities, medication, TENS, and heat Relief from Meds: 6  Family History  Problem Relation Age of Onset   Cancer Mother        lung   Hypertension Mother    Cancer Father        colon   Hypertension Sister     Cancer Sister    Cancer Brother    Hyperlipidemia Sister    Birth defects Maternal Grandmother        breast   Breast cancer Maternal Grandmother    Birth defects Paternal Grandmother        uterine, stomach, lung   Breast cancer Paternal Grandmother    Breast cancer Paternal Aunt    Heart attack Paternal Grandfather    Heart failure Paternal Grandfather    Social History   Socioeconomic History   Marital status: Married    Spouse name: Not on file   Number of children: 2   Years of education: Not on file   Highest education level: Not on file  Occupational History   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  Tobacco Use   Smoking status: Never    Passive exposure: Current   Smokeless tobacco: Never  Vaping Use   Vaping status: Never Used  Substance and Sexual Activity   Alcohol  use: Not Currently    Comment: 01/24/2013 drink or 2 once or /twice/year, if that   Drug use: No   Sexual activity: Yes    Birth control/protection: None  Other Topics Concern   Not on file  Social History Narrative   No living will   Would want husband as POA--then sister, [name]   Would accept resuscitation attempts   Probably would not want tube feeds if cognitively unaware   Pt lives with family    Pt doesn't work  Social Drivers of Corporate investment banker Strain: Not on file  Food Insecurity: Not on file  Transportation Needs: Not on file  Physical Activity: Not on file  Stress: Not on file  Social Connections: Not on file   Past Surgical History:  Procedure Laterality Date   APPENDECTOMY  ~ 06/2007   BLADDER REPAIR  ~ 06/2007   same day after bladder lift (01/24/2013)   BLADDER SUSPENSION  ~ 06/2007   BREAST BIOPSY Left    DIAGNOSTIC LAPAROSCOPY  1990's & ~ 2000   I've had a couple; for endometrosis (01/24/2013)   HERNIA REPAIR     INCISIONAL HERNIA REPAIR N/A 01/24/2013   Procedure: LAPAROSCOPIC INCISIONAL HERNIA;  Surgeon: Vicenta DELENA Poli, MD;  Location: [facility];  Service:  General;  Laterality: N/A;   INSERTION OF MESH N/A 01/24/2013   Procedure: INSERTION OF MESH;  Surgeon: Vicenta DELENA Poli, MD;  Location: [facility];  Service: General;  Laterality: N/A;   LAPAROSCOPIC INCISIONAL / UMBILICAL / VENTRAL HERNIA REPAIR  01/24/2013   IHR w/mesh/notes   NASAL SEPTUM SURGERY  1980's?   OOPHORECTOMY Right 2009   TONSILLECTOMY  1990's   VAGINAL HYSTERECTOMY  ` 06/2007   Past Surgical History:  Procedure Laterality Date   APPENDECTOMY  ~ 06/2007   BLADDER REPAIR  ~ 06/2007   same day after bladder lift (01/24/2013)   BLADDER SUSPENSION  ~ 06/2007   BREAST BIOPSY Left    DIAGNOSTIC LAPAROSCOPY  1990's & ~ 2000   I've had a couple; for endometrosis (01/24/2013)   HERNIA REPAIR     INCISIONAL HERNIA REPAIR N/A 01/24/2013   Procedure: LAPAROSCOPIC INCISIONAL HERNIA;  Surgeon: Vicenta DELENA Poli, MD;  Location: [facility];  Service: General;  Laterality: N/A;   INSERTION OF MESH N/A 01/24/2013   Procedure: INSERTION OF MESH;  Surgeon: Vicenta DELENA Poli, MD;  Location: [facility];  Service: General;  Laterality: N/A;   LAPAROSCOPIC INCISIONAL / UMBILICAL / VENTRAL HERNIA REPAIR  01/24/2013   IHR w/mesh/notes   NASAL SEPTUM SURGERY  1980's?   OOPHORECTOMY Right 2009   TONSILLECTOMY  1990's   VAGINAL HYSTERECTOMY  ` 06/2007   Past Medical History:  Diagnosis Date   Allergy    Anxiety    Arthritis    just the norm (01/24/2013)   ASCVD (arteriosclerotic cardiovascular disease)    Asthma    Celiac disease    Daily headache    depends on the season (01/24/2013)   Diabetes mellitus without complication (HCC)    Fibromyalgia    GERD (gastroesophageal reflux disease)    History of colonic polyps    Hyperlipidemia    Hypertension    Migraines    Obesity (BMI 30.0-34.9)    RSD (reflex sympathetic dystrophy)    Sleep apnea    NO MACHINE RECOMMENDED   TIA (transient ischemic attack) ~ 2009   Ht 5' 7 (1.702 m)   BMI 25.84 kg/m   Opioid Risk Score:   Fall Risk Score:   `1  Depression screen PHQ 2/9     04/06/2024   10:11 AM 02/27/2024    2:19 PM 02/22/2024    1:21 PM 12/09/2023    1:03 PM 11/08/2023    1:29 PM 10/10/2023    1:14 PM 07/08/2023    1:40 PM  Depression screen PHQ 2/9  Decreased Interest 0 0 0 1 1 1  0  Down, Depressed, Hopeless 0 0 0 1 1 1  0  PHQ - 2  Score 0 0 0 2 2 2  0    Review of Systems  Musculoskeletal:        Left arm below the elbow  All other systems reviewed and are negative.      Objective:   Physical Exam Vitals and nursing note reviewed.  Constitutional:      Appearance: Normal appearance.   Cardiovascular:     Rate and Rhythm: Normal rate and regular rhythm.     Pulses: Normal pulses.     Heart sounds: Normal heart sounds.  Pulmonary:     Effort: Pulmonary effort is normal.     Breath sounds: Normal breath sounds.   Musculoskeletal:     Comments: Normal Muscle Bulk and Muscle Testing Reveals:  Upper Extremities: Right: Full ROM and Muscle Strength  4/5 Left Upper Extremity: Decreased ROM 90 Degrees and Muscle Strength 3/5 Thoracic and Lumbar Hypersensitivity Bilateral Greater trochanter Tenderness Lower Extremities: Decreased ROM and Muscle Strength 5/5 Bilateral Lower Extremities Flexion Produces Pain into her bilateral Lower Extremities and Bilateral Feet Arises from Table slowly Antalgic  Gait      Skin:    General: Skin is warm and dry.   Neurological:     Mental Status: She is alert and oriented to person, place, and time.   Psychiatric:        Mood and Affect: Mood normal.        Behavior: Behavior normal.         Assessment & Plan:  1.Cervicalgia/ Cervical Radiculitis:  No Complaints today. Continue to Monitor. 04/12/2024 2.Complex Regional Pain Syndrome Type 1: Continue Topamax  and Cymbalta . 04/12/2024. RX: Butrans 5 MCG/HR weekly # 4 and Refilled: Hydro-codone 10/325mg  one tablet  5 times  a day as needed for pain #150 and  Tramadol   was discontinued due contraindicated with Tegretol   being prescribed by neurology. We will continue the opioid monitoring program, this consists of regular clinic visits, examinations, urine drug screen, pill counts as well as use of [place]  Controlled Substance Reporting system. A 12 month History has been reviewed on the [place]  Controlled Substance Reporting System  On 04/12/2024. 3. Depression: with Physical Illness:  Continue current medication regimen with Cymbalta .04/12/2024 4. Insomnia: Continue current medication regimen with  Trazodone  one and half tablet at bedtime  06/26//2025 5. Muscle Spasms: Continue current medication regimen with Tizanidine . 06/26 /2025 6. Chronic Pain Syndrome: Continue Ibuprofen  BID as needed. 04/12/2024 7. Migraine without Aura: Continue Topamax : Continue to Monitor. 06/026/2025 8. Dysphonia: She reports she was seen by ENT. S PCP Following. Continue to monitor.  04/12/2024 9. Trigeminal Neuralgia: [name] reports her dentist Dr Raynaldo diagnose her with Trigeminal Neuralgia. Neurology Following.  04/12/2024  F/U in 2 months

## 2024-04-23 ENCOUNTER — Encounter: Payer: Self-pay | Admitting: Internal Medicine

## 2024-04-23 NOTE — Telephone Encounter (Signed)
Printed and placed in your box for review

## 2024-04-24 NOTE — Telephone Encounter (Signed)
 She needs the one for the Plate not the Temp placecard.  She purchased a new vehicle and they would not transfer the plate for the new vehicle.

## 2024-04-24 NOTE — Telephone Encounter (Signed)
Patient picked up the parking placard.

## 2024-04-30 ENCOUNTER — Encounter: Payer: Self-pay | Admitting: Internal Medicine

## 2024-04-30 NOTE — Telephone Encounter (Signed)
 Permanent Plate application placed in Dr Marval inbox to sign when he returns next week.

## 2024-05-07 ENCOUNTER — Telehealth: Payer: Self-pay | Admitting: Registered Nurse

## 2024-05-07 DIAGNOSIS — G8929 Other chronic pain: Secondary | ICD-10-CM

## 2024-05-07 DIAGNOSIS — G90512 Complex regional pain syndrome I of left upper limb: Secondary | ICD-10-CM

## 2024-05-07 DIAGNOSIS — G894 Chronic pain syndrome: Secondary | ICD-10-CM

## 2024-05-07 MED ORDER — HYDROCODONE-ACETAMINOPHEN 10-325 MG PO TABS: 1.0000 | ORAL_TABLET | Freq: Every day | ORAL | 0 refills | Status: DC | PRN

## 2024-05-07 NOTE — Telephone Encounter (Signed)
 PMP was Reviewed.  Hydrocodone  e-scribed to pharmacy.  [name] is aware via My-Chart .

## 2024-05-08 ENCOUNTER — Encounter: Payer: Self-pay | Admitting: Internal Medicine

## 2024-05-09 ENCOUNTER — Telehealth: Payer: Self-pay

## 2024-05-09 ENCOUNTER — Ambulatory Visit (INDEPENDENT_AMBULATORY_CARE_PROVIDER_SITE_OTHER)

## 2024-05-09 DIAGNOSIS — E538 Deficiency of other specified B group vitamins: Secondary | ICD-10-CM

## 2024-05-09 MED ORDER — CYANOCOBALAMIN 1000 MCG/ML IJ SOLN
1000.0000 ug | Freq: Once | INTRAMUSCULAR | Status: AC
  Administered 2024-05-09: 1000 ug via INTRAMUSCULAR

## 2024-05-09 NOTE — Progress Notes (Signed)
 Per orders of Dr. Letvak, injection of monthly B12 1000 mcg/ml given by Clotilda SHAUNNA Pander, CMA in Right Deltoid. Patient tolerated injection well.  Forwarding to Dr Watt in Dr Marval absence.

## 2024-05-09 NOTE — Telephone Encounter (Signed)
 Pt has been having issues recently with anything new she is given medication wise, she is having allergic or intolerant reactions to. The rash she had recently was related to the carbamazepine . She has had to stop taking that and she is fearful of the trigeminal neuralgia pain intensifying, again. Pain clinic has increased her hydrocodone  from 2 to 5 a day to try and help with the pain. Neurologist told her to do whatever the pain doctor tells her to take. She also had a reaction to a vaginal suppository prescribed by her GYN. Pain doctor gave her a patch and had a reaction to that as well.   She is asking should she see an allergist or immunologist to see why she is having allergic reactions to so much all of a sudden.

## 2024-05-10 NOTE — Telephone Encounter (Signed)
 Spoke to pt

## 2024-05-13 ENCOUNTER — Other Ambulatory Visit: Payer: Self-pay | Admitting: Registered Nurse

## 2024-05-15 ENCOUNTER — Encounter: Payer: Self-pay | Admitting: Diagnostic Neuroimaging

## 2024-05-22 ENCOUNTER — Encounter: Payer: Self-pay | Admitting: Internal Medicine

## 2024-05-22 ENCOUNTER — Telehealth (INDEPENDENT_AMBULATORY_CARE_PROVIDER_SITE_OTHER): Admitting: Internal Medicine

## 2024-05-22 VITALS — Temp 101.5°F

## 2024-05-22 DIAGNOSIS — G501 Atypical facial pain: Secondary | ICD-10-CM | POA: Diagnosis not present

## 2024-05-22 DIAGNOSIS — U071 COVID-19: Secondary | ICD-10-CM | POA: Diagnosis not present

## 2024-05-22 MED ORDER — ALBUTEROL SULFATE HFA 108 (90 BASE) MCG/ACT IN AERS: 2.0000 | INHALATION_SPRAY | Freq: Four times a day (QID) | RESPIRATORY_TRACT | 1 refills | Status: AC | PRN

## 2024-05-22 MED ORDER — NIRMATRELVIR/RITONAVIR (PAXLOVID)TABLET: 3.0000 | ORAL_TABLET | Freq: Two times a day (BID) | ORAL | 0 refills | Status: AC

## 2024-05-22 NOTE — Assessment & Plan Note (Signed)
 Moderate symptoms Will try paxlovid  after discussion Analgesics--tylenol  ER if worsened--especially SOB

## 2024-05-22 NOTE — Progress Notes (Signed)
 Subjective:    Patient ID: [name], female    DOB: 09/04/1966, 58 y.o.   MRN: [id number]  HPI Video virtual visit due to COVID infection Identification done Reviewed limitations and billing and she gave consent Participants--patient in her home and I am in my office  2 days of illness Feels bad and tired Fever to 101.7 Cough and some SOB Joints ache Bad headache--and sore throat  Taking benedryl and ibuprofen  for headache--not much help  Current Outpatient Medications on File Prior to Visit  Medication Sig Dispense Refill   albuterol  (VENTOLIN  HFA) 108 (90 Base) MCG/ACT inhaler Inhale 2 puffs into the lungs every 6 (six) hours as needed for wheezing or shortness of breath. 18 g 1   aspirin  EC 81 MG tablet Take 1 tablet (81 mg total) by mouth daily. Swallow whole.     budesonide -formoterol  (SYMBICORT ) 160-4.5 MCG/ACT inhaler Inhale 2 puffs into the lungs daily. 1 each 12   cyanocobalamin  (VITAMIN B12) 1000 MCG/ML injection Inject 1,000 mcg into the muscle every 30 (thirty) days.     CYMBALTA  30 MG capsule TAKE 3 CAPSULES BY MOUTH EVERY DAY 270 capsule 2   DULoxetine  (CYMBALTA ) 60 MG capsule TAKE 1 CAPSULE BY MOUTH EVERY DAY 90 capsule 3   empagliflozin  (JARDIANCE ) 10 MG TABS tablet Take 1 tablet (10 mg total) by mouth daily before breakfast. 30 tablet 11   fluticasone  (FLONASE ) 50 MCG/ACT nasal spray SPRAY 2 SPRAYS INTO EACH NOSTRIL EVERY DAY 48 mL 2   HYDROcodone -acetaminophen  (NORCO) 10-325 MG tablet Take 1 tablet by mouth 5 (five) times daily as needed. 150 tablet 0   ibuprofen  (ADVIL ) 600 MG tablet Take 1 tablet (600 mg total) by mouth every 8 (eight) hours as needed for mild pain (pain score 1-3). 90 tablet 5   loratadine (CLARITIN) 10 MG tablet Take 10 mg by mouth daily.     metFORMIN  (GLUCOPHAGE -XR) 500 MG 24 hr tablet TAKE 2 TABLETS BY MOUTH EVERY DAY WITH BREAKFAST 180 tablet 3   montelukast  (SINGULAIR ) 10 MG tablet TAKE 1 TABLET BY MOUTH EVERYDAY AT BEDTIME  90 tablet 3   olmesartan (BENICAR) 40 MG tablet TAKE 1 TABLET BY MOUTH EVERY DAY 90 tablet 3   omeprazole (PRILOSEC) 40 MG capsule Take 40 mg by mouth 2 (two) times daily.     ondansetron  (ZOFRAN -ODT) 4 MG disintegrating tablet Take 4 mg by mouth daily as needed.     rosuvastatin  (CRESTOR ) 20 MG tablet Take 1 tablet (20 mg total) by mouth daily. 90 tablet 0   tiZANidine  (ZANAFLEX ) 4 MG tablet Take 1 tablet (4 mg total) by mouth every 6 (six) hours as needed for muscle spasms. 120 tablet 3   topiramate  (TOPAMAX ) 100 MG tablet TAKE 1 TABLET BY MOUTH EVERY MORNING AND TAKE 2 TABLETS BY MOUTH EVERY EVENING 270 tablet 1   traZODone  (DESYREL ) 50 MG tablet TAKE 1 AND 1/2 TABLETS BY MOUTH AT BEDTIME 135 tablet 3   clobetasol  ointment (TEMOVATE ) 0.05 % Apply 1 Application topically 2 (two) times daily.     fluconazole  (DIFLUCAN ) 150 MG tablet Take 1 tablet (150 mg total) by mouth 2 (two) times a week. (Patient not taking: Reported on 05/22/2024) 8 tablet 3   [DISCONTINUED] losartan  (COZAAR ) 100 MG tablet TAKE 1 TABLET BY MOUTH EVERY DAY 90 tablet 0   No current facility-administered medications on file prior to visit.    Allergies  Allergen Reactions   Meperidine  Anaphylaxis, Itching and Swelling    REACTION:  swelling   Carbamazepine  Other (See Comments)    Rash, body aches, tongue spasms   Cymbalta  [Duloxetine  Hcl] Hives    Generic makes her feel like she is crawling out of her skin   Diazepam Hives    REACTION: swelling   Other     Axe Cologne   Shellfish Allergy Hives    Past Medical History:  Diagnosis Date   Allergy    Anxiety    Arthritis    just the norm (01/24/2013)   ASCVD (arteriosclerotic cardiovascular disease)    Asthma    Celiac disease    Daily headache    depends on the season (01/24/2013)   Diabetes mellitus without complication (HCC)    Fibromyalgia    GERD (gastroesophageal reflux disease)    History of colonic polyps    Hyperlipidemia    Hypertension     Migraines    Obesity (BMI 30.0-34.9)    RSD (reflex sympathetic dystrophy)    Sleep apnea    NO MACHINE RECOMMENDED   TIA (transient ischemic attack) ~ 2009    Past Surgical History:  Procedure Laterality Date   APPENDECTOMY  ~ 06/2007   BLADDER REPAIR  ~ 06/2007   same day after bladder lift (01/24/2013)   BLADDER SUSPENSION  ~ 06/2007   BREAST BIOPSY Left    DIAGNOSTIC LAPAROSCOPY  1990's & ~ 2000   I've had a couple; for endometrosis (01/24/2013)   HERNIA REPAIR     INCISIONAL HERNIA REPAIR N/A 01/24/2013   Procedure: LAPAROSCOPIC INCISIONAL HERNIA;  Surgeon: Vicenta DELENA Poli, MD;  Location: [facility] OR;  Service: General;  Laterality: N/A;   INSERTION OF MESH N/A 01/24/2013   Procedure: INSERTION OF MESH;  Surgeon: Vicenta DELENA Poli, MD;  Location: [facility] OR;  Service: General;  Laterality: N/A;   LAPAROSCOPIC INCISIONAL / UMBILICAL / VENTRAL HERNIA REPAIR  01/24/2013   IHR w/mesh/notes   NASAL SEPTUM SURGERY  1980's?   OOPHORECTOMY Right 2009   TONSILLECTOMY  1990's   VAGINAL HYSTERECTOMY  ` 06/2007    Family History  Problem Relation Age of Onset   Cancer Mother        lung   Hypertension Mother    Cancer Father        colon   Hypertension Sister    Cancer Sister    Cancer Brother    Hyperlipidemia Sister    Birth defects Maternal Grandmother        breast   Breast cancer Maternal Grandmother    Birth defects Paternal Grandmother        uterine, stomach, lung   Breast cancer Paternal Grandmother    Breast cancer Paternal Aunt    Heart attack Paternal Grandfather    Heart failure Paternal Grandfather     Social History   Socioeconomic History   Marital status: Married    Spouse name: Not on file   Number of children: 2   Years of education: Not on file   Highest education level: Not on file  Occupational History   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  Tobacco Use   Smoking status: Never    Passive exposure: Current   Smokeless tobacco: Never  Vaping Use   Vaping  status: Never Used  Substance and Sexual Activity   Alcohol  use: Not Currently    Comment: 01/24/2013 drink or 2 once or /twice/year, if that   Drug use: No   Sexual activity: Yes    Birth control/protection: None  Other  Topics Concern   Not on file  Social History Narrative   No living will   Would want husband as POA--then sister, Barnie   Would accept resuscitation attempts   Probably would not want tube feeds if cognitively unaware   Pt lives with family    Pt doesn't work    Social Drivers of Corporate investment banker Strain: Not on Ship broker Insecurity: Not on file  Transportation Needs: Not on file  Physical Activity: Not on file  Stress: Not on file  Social Connections: Not on file  Intimate Partner Violence: Not on file   Review of Systems Has lost smell and taste Some nausea--no vomiting No appetite    Objective:   Physical Exam Constitutional:      Comments: Ill appearing but no distress  Pulmonary:     Effort: Pulmonary effort is normal. No respiratory distress.  Neurological:     Mental Status: She is alert.            Assessment & Plan:

## 2024-05-22 NOTE — Assessment & Plan Note (Signed)
 Didn't tolerate carbamezapine Getting no where with the neurologist Gabapentin  didn't help Wonders about baclofen  along with another medication--but she doesn't remember what it was She will check and get back to me

## 2024-05-22 NOTE — Telephone Encounter (Signed)
 Pt on virtual visit right now with Dr Jimmy.

## 2024-05-23 ENCOUNTER — Encounter: Payer: Self-pay | Admitting: Internal Medicine

## 2024-05-23 ENCOUNTER — Other Ambulatory Visit: Payer: Self-pay | Admitting: Internal Medicine

## 2024-05-23 MED ORDER — BACLOFEN 10 MG PO TABS: 10.0000 mg | ORAL_TABLET | Freq: Three times a day (TID) | ORAL | 3 refills | Status: DC

## 2024-05-28 ENCOUNTER — Encounter: Payer: Self-pay | Admitting: Diagnostic Neuroimaging

## 2024-05-28 DIAGNOSIS — K029 Dental caries, unspecified: Secondary | ICD-10-CM

## 2024-05-28 DIAGNOSIS — M26621 Arthralgia of right temporomandibular joint: Secondary | ICD-10-CM

## 2024-05-28 DIAGNOSIS — R519 Headache, unspecified: Secondary | ICD-10-CM

## 2024-05-28 DIAGNOSIS — G4451 Hemicrania continua: Secondary | ICD-10-CM

## 2024-05-31 ENCOUNTER — Encounter: Payer: Self-pay | Admitting: Internal Medicine

## 2024-05-31 ENCOUNTER — Ambulatory Visit: Admitting: Internal Medicine

## 2024-05-31 VITALS — BP 120/80 | HR 75 | Temp 98.3°F | Ht 67.0 in | Wt 163.0 lb

## 2024-05-31 DIAGNOSIS — G90529 Complex regional pain syndrome I of unspecified lower limb: Secondary | ICD-10-CM

## 2024-05-31 DIAGNOSIS — Z7984 Long term (current) use of oral hypoglycemic drugs: Secondary | ICD-10-CM | POA: Diagnosis not present

## 2024-05-31 DIAGNOSIS — E1159 Type 2 diabetes mellitus with other circulatory complications: Secondary | ICD-10-CM

## 2024-05-31 DIAGNOSIS — E1149 Type 2 diabetes mellitus with other diabetic neurological complication: Secondary | ICD-10-CM

## 2024-05-31 DIAGNOSIS — G501 Atypical facial pain: Secondary | ICD-10-CM | POA: Diagnosis not present

## 2024-05-31 LAB — POCT GLYCOSYLATED HEMOGLOBIN (HGB A1C): Hemoglobin A1C: 6.7 % — AB (ref 4.0–5.6)

## 2024-05-31 NOTE — Assessment & Plan Note (Signed)
 On topiramate , duloxetine  and hydrocodone  Continues with pain specialist

## 2024-05-31 NOTE — Progress Notes (Signed)
 Subjective:    Patient ID: [name], female    DOB: 11/26/1965, 58 y.o.   MRN: [id number]  HPI Here for follow up of chronic facial pain and other chronic medical conditions  Still run down, headache, fatigued from the COVID infection Some cough and slight SOB  Did try the baclofen ---felt sleepy and high out of my mind Facial pain is still bad Now feels it in neck and shoulders Pain is so severe she has contemplated suicide--but won't do it  Ongoing RSD and fibromyalgia as well  Sugar 181 this morning--then down to 140 Usually in 120's  Current Outpatient Medications on File Prior to Visit  Medication Sig Dispense Refill   albuterol  (VENTOLIN  HFA) 108 (90 Base) MCG/ACT inhaler Inhale 2 puffs into the lungs every 6 (six) hours as needed for wheezing or shortness of breath. 18 g 1   aspirin  EC 81 MG tablet Take 1 tablet (81 mg total) by mouth daily. Swallow whole.     budesonide -formoterol  (SYMBICORT ) 160-4.5 MCG/ACT inhaler Inhale 2 puffs into the lungs daily. 1 each 12   clobetasol  ointment (TEMOVATE ) 0.05 % Apply 1 Application topically 2 (two) times daily.     cyanocobalamin  (VITAMIN B12) 1000 MCG/ML injection Inject 1,000 mcg into the muscle every 30 (thirty) days.     CYMBALTA  30 MG capsule TAKE 3 CAPSULES BY MOUTH EVERY DAY 270 capsule 2   DULoxetine  (CYMBALTA ) 60 MG capsule TAKE 1 CAPSULE BY MOUTH EVERY DAY 90 capsule 3   empagliflozin  (JARDIANCE ) 10 MG TABS tablet Take 1 tablet (10 mg total) by mouth daily before breakfast. 30 tablet 11   fluconazole  (DIFLUCAN ) 150 MG tablet TAKE 1 TABLET (150 MG TOTAL) BY MOUTH TWO TIMES A WEEK 8 tablet 3   fluticasone  (FLONASE ) 50 MCG/ACT nasal spray SPRAY 2 SPRAYS INTO EACH NOSTRIL EVERY DAY 48 mL 2   HYDROcodone -acetaminophen  (NORCO) 10-325 MG tablet Take 1 tablet by mouth 5 (five) times daily as needed. 150 tablet 0   ibuprofen  (ADVIL ) 600 MG tablet Take 1 tablet (600 mg total) by mouth every 8 (eight) hours as needed for  mild pain (pain score 1-3). 90 tablet 5   loratadine (CLARITIN) 10 MG tablet Take 10 mg by mouth daily.     metFORMIN  (GLUCOPHAGE -XR) 500 MG 24 hr tablet TAKE 2 TABLETS BY MOUTH EVERY DAY WITH BREAKFAST 180 tablet 3   montelukast  (SINGULAIR ) 10 MG tablet TAKE 1 TABLET BY MOUTH EVERYDAY AT BEDTIME 90 tablet 3   olmesartan (BENICAR) 40 MG tablet TAKE 1 TABLET BY MOUTH EVERY DAY 90 tablet 3   omeprazole (PRILOSEC) 40 MG capsule Take 40 mg by mouth 2 (two) times daily.     ondansetron  (ZOFRAN -ODT) 4 MG disintegrating tablet Take 4 mg by mouth daily as needed.     rosuvastatin  (CRESTOR ) 20 MG tablet Take 1 tablet (20 mg total) by mouth daily. 90 tablet 0   topiramate  (TOPAMAX ) 100 MG tablet TAKE 1 TABLET BY MOUTH EVERY MORNING AND TAKE 2 TABLETS BY MOUTH EVERY EVENING 270 tablet 1   traZODone  (DESYREL ) 50 MG tablet TAKE 1 AND 1/2 TABLETS BY MOUTH AT BEDTIME 135 tablet 3   tiZANidine  (ZANAFLEX ) 4 MG tablet Take 1 tablet (4 mg total) by mouth every 6 (six) hours as needed for muscle spasms. (Patient not taking: Reported on 05/31/2024) 120 tablet 3   [DISCONTINUED] losartan  (COZAAR ) 100 MG tablet TAKE 1 TABLET BY MOUTH EVERY DAY 90 tablet 0   No current facility-administered medications on  file prior to visit.    Allergies  Allergen Reactions   Baclofen     Meperidine  Anaphylaxis, Itching and Swelling    REACTION: swelling   Carbamazepine  Other (See Comments)    Rash, body aches, tongue spasms   Cymbalta  [Duloxetine  Hcl] Hives    Generic makes her feel like she is crawling out of her skin   Diazepam Hives    REACTION: swelling   Other     Axe Cologne   Shellfish Allergy Hives    Past Medical History:  Diagnosis Date   Allergy    Anxiety    Arthritis    just the norm (01/24/2013)   ASCVD (arteriosclerotic cardiovascular disease)    Asthma    Celiac disease    Daily headache    depends on the season (01/24/2013)   Diabetes mellitus without complication (HCC)    Fibromyalgia    GERD  (gastroesophageal reflux disease)    History of colonic polyps    Hyperlipidemia    Hypertension    Migraines    Obesity (BMI 30.0-34.9)    RSD (reflex sympathetic dystrophy)    Sleep apnea    NO MACHINE RECOMMENDED   TIA (transient ischemic attack) ~ 2009    Past Surgical History:  Procedure Laterality Date   APPENDECTOMY  ~ 06/2007   BLADDER REPAIR  ~ 06/2007   same day after bladder lift (01/24/2013)   BLADDER SUSPENSION  ~ 06/2007   BREAST BIOPSY Left    DIAGNOSTIC LAPAROSCOPY  1990's & ~ 2000   I've had a couple; for endometrosis (01/24/2013)   HERNIA REPAIR     INCISIONAL HERNIA REPAIR N/A 01/24/2013   Procedure: LAPAROSCOPIC INCISIONAL HERNIA;  Surgeon: Vicenta DELENA Poli, MD;  Location: [facility] OR;  Service: General;  Laterality: N/A;   INSERTION OF MESH N/A 01/24/2013   Procedure: INSERTION OF MESH;  Surgeon: Vicenta DELENA Poli, MD;  Location: [facility] OR;  Service: General;  Laterality: N/A;   LAPAROSCOPIC INCISIONAL / UMBILICAL / VENTRAL HERNIA REPAIR  01/24/2013   IHR w/mesh/notes   NASAL SEPTUM SURGERY  1980's?   OOPHORECTOMY Right 2009   TONSILLECTOMY  1990's   VAGINAL HYSTERECTOMY  ` 06/2007    Family History  Problem Relation Age of Onset   Cancer Mother        lung   Hypertension Mother    Cancer Father        colon   Hypertension Sister    Cancer Sister    Cancer Brother    Hyperlipidemia Sister    Birth defects Maternal Grandmother        breast   Breast cancer Maternal Grandmother    Birth defects Paternal Grandmother        uterine, stomach, lung   Breast cancer Paternal Grandmother    Breast cancer Paternal Aunt    Heart attack Paternal Grandfather    Heart failure Paternal Grandfather     Social History   Socioeconomic History   Marital status: Married    Spouse name: Not on file   Number of children: 2   Years of education: Not on file   Highest education level: Not on file  Occupational History   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  Tobacco Use    Smoking status: Never    Passive exposure: Current   Smokeless tobacco: Never  Vaping Use   Vaping status: Never Used  Substance and Sexual Activity   Alcohol  use: Not Currently    Comment: 01/24/2013  drink or 2 once or /twice/year, if that   Drug use: No   Sexual activity: Yes    Birth control/protection: None  Other Topics Concern   Not on file  Social History Narrative   No living will   Would want husband as POA--then sister, Barnie   Would accept resuscitation attempts   Probably would not want tube feeds if cognitively unaware   Pt lives with family    Pt doesn't work    Social Drivers of Corporate investment banker Strain: Not on Ship broker Insecurity: Not on file  Transportation Needs: Not on file  Physical Activity: Not on file  Stress: Not on file  Social Connections: Not on file  Intimate Partner Violence: Not on file   Review of Systems Eating soft foods Weight is holding Sleep is worse    Objective:   Physical Exam Constitutional:      Appearance: Normal appearance.  Cardiovascular:     Rate and Rhythm: Normal rate and regular rhythm.     Pulses: Normal pulses.     Heart sounds: No murmur heard.    No gallop.  Pulmonary:     Effort: Pulmonary effort is normal.     Breath sounds: Normal breath sounds. No wheezing or rales.  Musculoskeletal:     Cervical back: Neck supple.     Right lower leg: No edema.     Left lower leg: No edema.  Lymphadenopathy:     Cervical: No cervical adenopathy.  Neurological:     Mental Status: She is alert.            Assessment & Plan:

## 2024-05-31 NOTE — Assessment & Plan Note (Signed)
 Lab Results  Component Value Date   HGBA1C 6.7 (A) 05/31/2024   Control is still good on metformin  1000mg  daily and jardiance  10

## 2024-05-31 NOTE — Assessment & Plan Note (Signed)
 Ongoing and severe Didn't tolerate carbamazepine  or baclofen  Gabapentin  didn't help Will make referral for second opinion at [facility]

## 2024-06-04 ENCOUNTER — Telehealth: Payer: Self-pay | Admitting: Neurology

## 2024-06-04 ENCOUNTER — Other Ambulatory Visit: Payer: Self-pay | Admitting: Internal Medicine

## 2024-06-04 NOTE — Telephone Encounter (Signed)
 Referral for Neurosurgery  faxed to [facility]   [facility]  Phone:[phone] Fax: [phone]

## 2024-06-05 ENCOUNTER — Encounter: Payer: Self-pay | Admitting: Internal Medicine

## 2024-06-05 ENCOUNTER — Other Ambulatory Visit: Payer: Self-pay | Admitting: Cardiology

## 2024-06-06 ENCOUNTER — Other Ambulatory Visit: Payer: Self-pay | Admitting: Internal Medicine

## 2024-06-06 ENCOUNTER — Encounter: Payer: Self-pay | Admitting: Internal Medicine

## 2024-06-06 DIAGNOSIS — Z1231 Encounter for screening mammogram for malignant neoplasm of breast: Secondary | ICD-10-CM

## 2024-06-07 ENCOUNTER — Encounter: Payer: Self-pay | Admitting: Internal Medicine

## 2024-06-08 MED ORDER — ROSUVASTATIN CALCIUM 20 MG PO TABS: 20.0000 mg | ORAL_TABLET | Freq: Every day | ORAL | 0 refills | Status: DC

## 2024-06-08 NOTE — Progress Notes (Signed)
 Subjective:    Patient ID: [name], female    DOB: [birth year], 58 y.o.   MRN: [id number]  HPI: [name] is a 58 y.o. female who returns for follow up appointment for chronic pain and medication refill. She states her pain is located in her left arm. She rates her pain 8. Her current exercise regime is walking short distances  and performing stretching exercises.  [name] Morphine  equivalent is 50.00 MME.   Oral Swab was Performed today.    Pain Inventory Average Pain 8 Pain Right Now 8 My pain is constant, sharp, burning, stabbing, tingling, and aching  In the last 24 hours, has pain interfered with the following? General activity  Relation with others 4 Enjoyment of life 4 What TIME of day is your pain at its worst? daytime, evening, and night Sleep (in general) Poor  Pain is worse with: walking, bending, sitting, standing, and some activites Pain improves with: rest, pacing activities, medication, TENS, and heat Relief from Meds: 6  Family History  Problem Relation Age of Onset   Cancer Mother        lung   Hypertension Mother    Cancer Father        colon   Hypertension Sister    Cancer Sister    Cancer Brother    Hyperlipidemia Sister    Birth defects Maternal Grandmother        breast   Breast cancer Maternal Grandmother    Birth defects Paternal Grandmother        uterine, stomach, lung   Breast cancer Paternal Grandmother    Breast cancer Paternal Aunt    Heart attack Paternal Grandfather    Heart failure Paternal Grandfather    Social History   Socioeconomic History   Marital status: Married    Spouse name: Not on file   Number of children: 2   Years of education: Not on file   Highest education level: Not on file  Occupational History   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  Tobacco Use   Smoking status: Never    Passive exposure: Current   Smokeless tobacco: Never  Vaping Use   Vaping status: Never Used   Substance and Sexual Activity   Alcohol  use: Not Currently    Comment: 01/24/2013 drink or 2 once or /twice/year, if that   Drug use: No   Sexual activity: Yes    Birth control/protection: None  Other Topics Concern   Not on file  Social History Narrative   No living will   Would want husband as POA--then sister, Barnie   Would accept resuscitation attempts   Probably would not want tube feeds if cognitively unaware   Pt lives with family    Pt doesn't work    Social Drivers of Corporate investment banker Strain: Not on file  Food Insecurity: Not on file  Transportation Needs: Not on file  Physical Activity: Not on file  Stress: Not on file  Social Connections: Not on file   Past Surgical History:  Procedure Laterality Date   APPENDECTOMY  ~ 06/2007   BLADDER REPAIR  ~ 06/2007   same day after bladder lift (01/24/2013)   BLADDER SUSPENSION  ~ 06/2007   BREAST BIOPSY Left    DIAGNOSTIC LAPAROSCOPY  1990's & ~ 2000   I've had a couple; for endometrosis (01/24/2013)   HERNIA REPAIR     INCISIONAL HERNIA REPAIR N/A 01/24/2013   Procedure: LAPAROSCOPIC INCISIONAL  HERNIA;  Surgeon: Vicenta DELENA Poli, MD;  Location: [facility] OR;  Service: General;  Laterality: N/A;   INSERTION OF MESH N/A 01/24/2013   Procedure: INSERTION OF MESH;  Surgeon: Vicenta DELENA Poli, MD;  Location: [facility] OR;  Service: General;  Laterality: N/A;   LAPAROSCOPIC INCISIONAL / UMBILICAL / VENTRAL HERNIA REPAIR  01/24/2013   IHR w/mesh/notes   NASAL SEPTUM SURGERY  1980's?   OOPHORECTOMY Right 2009   TONSILLECTOMY  1990's   VAGINAL HYSTERECTOMY  ` 06/2007   Past Surgical History:  Procedure Laterality Date   APPENDECTOMY  ~ 06/2007   BLADDER REPAIR  ~ 06/2007   same day after bladder lift (01/24/2013)   BLADDER SUSPENSION  ~ 06/2007   BREAST BIOPSY Left    DIAGNOSTIC LAPAROSCOPY  1990's & ~ 2000   I've had a couple; for endometrosis (01/24/2013)   HERNIA REPAIR     INCISIONAL HERNIA REPAIR N/A 01/24/2013    Procedure: LAPAROSCOPIC INCISIONAL HERNIA;  Surgeon: Vicenta DELENA Poli, MD;  Location: [facility] OR;  Service: General;  Laterality: N/A;   INSERTION OF MESH N/A 01/24/2013   Procedure: INSERTION OF MESH;  Surgeon: Vicenta DELENA Poli, MD;  Location: [facility] OR;  Service: General;  Laterality: N/A;   LAPAROSCOPIC INCISIONAL / UMBILICAL / VENTRAL HERNIA REPAIR  01/24/2013   IHR w/mesh/notes   NASAL SEPTUM SURGERY  1980's?   OOPHORECTOMY Right 2009   TONSILLECTOMY  1990's   VAGINAL HYSTERECTOMY  ` 06/2007   Past Medical History:  Diagnosis Date   Allergy    Anxiety    Arthritis    just the norm (01/24/2013)   ASCVD (arteriosclerotic cardiovascular disease)    Asthma    Celiac disease    Daily headache    depends on the season (01/24/2013)   Diabetes mellitus without complication (HCC)    Fibromyalgia    GERD (gastroesophageal reflux disease)    History of colonic polyps    Hyperlipidemia    Hypertension    Migraines    Obesity (BMI 30.0-34.9)    RSD (reflex sympathetic dystrophy)    Sleep apnea    NO MACHINE RECOMMENDED   TIA (transient ischemic attack) ~ 2009   There were no vitals taken for this visit.  Opioid Risk Score:   Fall Risk Score:  `1  Depression screen [facility] 2/9     04/06/2024   10:11 AM 02/27/2024    2:19 PM 02/22/2024    1:21 PM 12/09/2023    1:03 PM 11/08/2023    1:29 PM 10/10/2023    1:14 PM 07/08/2023    1:40 PM  Depression screen PHQ 2/9  Decreased Interest 0 0 0 1 1 1  0  Down, Depressed, Hopeless 0 0 0 1 1 1  0  PHQ - 2 Score 0 0 0 2 2 2  0    Review of Systems  Musculoskeletal:        Left mid arm pain to left hand  All other systems reviewed and are negative.      Objective:   Physical Exam Vitals and nursing note reviewed.  Constitutional:      Appearance: Normal appearance.  Neck:     Comments: Cervical Paraspinal Tenderness: C-5-C-6  Cardiovascular:     Rate and Rhythm: Normal rate and regular rhythm.  Pulmonary:     Effort: Pulmonary effort is  normal.     Breath sounds: Normal breath sounds.  Musculoskeletal:     Comments: Normal Muscle Bulk and Muscle Testing Reveals:  Upper Extremities: Decreased ROM ROM and Muscle Strength Right 4/5 and Left 3/5  Bilateral AC Joint Tenderness Thoracic  Hypersensitivity Lumbar Paraspinal Tenderness: L-4-L-5  Lower Extremities: Full ROM and Muscle Strength 5/5 Arises from Table Slowly Antalgic  Gait     Skin:    General: Skin is warm and dry.  Neurological:     Mental Status: She is alert and oriented to person, place, and time.  Psychiatric:        Mood and Affect: Mood normal.        Behavior: Behavior normal.          Assessment & Plan:  1.Cervicalgia/ Cervical Radiculitis:  No Complaints today. Continue to Monitor. 06/11/2024 2.Complex Regional Pain Syndrome Type 1: Continue Topamax  and Cymbalta . 06/11/2024.  Refilled: Hydro-codone 10/325mg  one tablet  5 times  a day as needed for pain #150 and  Tramadol   was discontinued due contraindicated with Tegretol  being prescribed by neurology. We will continue the opioid monitoring program, this consists of regular clinic visits, examinations, urine drug screen, pill counts as well as use of [place]  Controlled Substance Reporting system. A 12 month History has been reviewed on the [place]  Controlled Substance Reporting System  On 06/11/2024. 3. Depression: with Physical Illness:  Continue current medication regimen with Cymbalta .06/11/2024 4. Insomnia: Continue current medication regimen with  Trazodone  one and half tablet at bedtime  08/25//2025 5. Muscle Spasms: Continue current medication regimen. 08/25 /2025 6. Chronic Pain Syndrome: Continue Ibuprofen  BID as needed. 06/11/2024 7. Migraine without Aura: Continue Topamax : Continue to Monitor. 06/11/2024 8. Dysphonia: She reports she was seen by ENT. S PCP Following. Continue to monitor.  06/11/2024 9. Trigeminal Neuralgia: Ms. Sallye Racer reports her dentist Dr  Raynaldo diagnose her with Trigeminal Neuralgia. Neurology Following.  06/11/2024   F/U in 1 month

## 2024-06-08 NOTE — Telephone Encounter (Addendum)
 Looks like we did labs in May but not a Lipid Panel. Cardiologist had sent lab orders to [facility] but they have not been done. Should we have her come in for fasting labs?

## 2024-06-11 ENCOUNTER — Other Ambulatory Visit: Payer: Self-pay | Admitting: Internal Medicine

## 2024-06-11 ENCOUNTER — Encounter: Payer: Worker's Compensation | Attending: Registered Nurse | Admitting: Registered Nurse

## 2024-06-11 ENCOUNTER — Encounter: Payer: Self-pay | Admitting: Registered Nurse

## 2024-06-11 VITALS — BP 125/77 | HR 95 | Ht 67.0 in | Wt 167.0 lb

## 2024-06-11 DIAGNOSIS — Z79891 Long term (current) use of opiate analgesic: Secondary | ICD-10-CM | POA: Diagnosis present

## 2024-06-11 DIAGNOSIS — Z5181 Encounter for therapeutic drug level monitoring: Secondary | ICD-10-CM | POA: Insufficient documentation

## 2024-06-11 DIAGNOSIS — G90512 Complex regional pain syndrome I of left upper limb: Secondary | ICD-10-CM | POA: Insufficient documentation

## 2024-06-11 DIAGNOSIS — I10 Essential (primary) hypertension: Secondary | ICD-10-CM

## 2024-06-11 DIAGNOSIS — G894 Chronic pain syndrome: Secondary | ICD-10-CM | POA: Insufficient documentation

## 2024-06-11 MED ORDER — HYDROCODONE-ACETAMINOPHEN 10-325 MG PO TABS: 1.0000 | ORAL_TABLET | Freq: Every day | ORAL | 0 refills | Status: DC | PRN

## 2024-06-11 NOTE — Patient Instructions (Signed)
 Send a My Chart message with update from Neuro surgeon appointment on 98/26/2025

## 2024-06-11 NOTE — Addendum Note (Signed)
 Addended by: HOPE VEVA PARAS on: 06/11/2024 12:23 PM   Modules accepted: Orders

## 2024-06-11 NOTE — Telephone Encounter (Unsigned)
 Copied from CRM #[id number]. Topic: Appointments - Scheduling Inquiry for Clinic >> Jun 08, 2024  4:44 PM Terri G wrote: Reason for CRM: Patient has an appointment on 06/13/2024 at 2:15 and wanted to know if she can add lab work to that appointment as well. Please give patient a call to confirm if allow Monday please. Callback [phone]

## 2024-06-12 ENCOUNTER — Encounter: Payer: Self-pay | Admitting: Internal Medicine

## 2024-06-12 ENCOUNTER — Ambulatory Visit

## 2024-06-12 DIAGNOSIS — G500 Trigeminal neuralgia: Secondary | ICD-10-CM | POA: Diagnosis not present

## 2024-06-12 MED ORDER — NALOXONE HCL 4 MG/0.1ML NA LIQD: NASAL | 0 refills | Status: AC

## 2024-06-13 ENCOUNTER — Ambulatory Visit: Admitting: Registered Nurse

## 2024-06-13 ENCOUNTER — Other Ambulatory Visit (INDEPENDENT_AMBULATORY_CARE_PROVIDER_SITE_OTHER)

## 2024-06-13 ENCOUNTER — Ambulatory Visit (INDEPENDENT_AMBULATORY_CARE_PROVIDER_SITE_OTHER): Admitting: *Deleted

## 2024-06-13 DIAGNOSIS — I10 Essential (primary) hypertension: Secondary | ICD-10-CM | POA: Diagnosis not present

## 2024-06-13 DIAGNOSIS — E538 Deficiency of other specified B group vitamins: Secondary | ICD-10-CM

## 2024-06-13 MED ORDER — CYANOCOBALAMIN 1000 MCG/ML IJ SOLN
1000.0000 ug | Freq: Once | INTRAMUSCULAR | Status: AC
  Administered 2024-06-13: 1000 ug via INTRAMUSCULAR

## 2024-06-13 NOTE — Progress Notes (Signed)
 Per orders of Dr. Jacques Copland, injection of Vitamin B12 given by Manuelita JAYSON Frost in left deltoid. Patient tolerated injection well.

## 2024-06-14 ENCOUNTER — Encounter: Payer: Self-pay | Admitting: Internal Medicine

## 2024-06-14 ENCOUNTER — Ambulatory Visit: Payer: Self-pay | Admitting: Family

## 2024-06-14 LAB — DRUG TOX MONITOR 1 W/CONF, ORAL FLD
Amphetamines: NEGATIVE ng/mL (ref <10)
Barbiturates: NEGATIVE ng/mL (ref <10)
Benzodiazepines: NEGATIVE ng/mL (ref <0.50)
Buprenorphine: NEGATIVE ng/mL (ref <0.10)
Cocaine: NEGATIVE ng/mL (ref <5.0)
Codeine: NEGATIVE ng/mL (ref <2.5)
Dihydrocodeine: 8.6 ng/mL — ABNORMAL HIGH (ref <2.5)
Fentanyl: NEGATIVE ng/mL (ref <0.10)
Heroin Metabolite: NEGATIVE ng/mL (ref <1.0)
Hydrocodone: 101 ng/mL — ABNORMAL HIGH (ref <2.5)
Hydromorphone: NEGATIVE ng/mL (ref <2.5)
MARIJUANA: NEGATIVE ng/mL (ref <2.5)
MDMA: NEGATIVE ng/mL (ref <10)
Meprobamate: NEGATIVE ng/mL (ref <2.5)
Methadone: NEGATIVE ng/mL (ref <5.0)
Morphine: NEGATIVE ng/mL (ref <2.5)
Nicotine Metabolite: NEGATIVE ng/mL (ref <5.0)
Norhydrocodone: 12.1 ng/mL — ABNORMAL HIGH (ref <2.5)
Noroxycodone: NEGATIVE ng/mL (ref <2.5)
Opiates: POSITIVE ng/mL — AB (ref <2.5)
Oxycodone: NEGATIVE ng/mL (ref <2.5)
Oxymorphone: NEGATIVE ng/mL (ref <2.5)
Phencyclidine: NEGATIVE ng/mL (ref <10)
Tapentadol: NEGATIVE ng/mL (ref <5.0)
Tramadol: NEGATIVE ng/mL (ref <5.0)
Zolpidem: NEGATIVE ng/mL (ref <5.0)

## 2024-06-14 LAB — LIPID PANEL
Cholesterol: 154 mg/dL (ref 0–200)
HDL: 43.4 mg/dL (ref >39.00)
LDL Cholesterol: 51 mg/dL (ref 0–99)
NonHDL: 110.82
Total CHOL/HDL Ratio: 4
Triglycerides: 298 mg/dL — ABNORMAL HIGH (ref 0.0–149.0)
VLDL: 59.6 mg/dL — ABNORMAL HIGH (ref 0.0–40.0)

## 2024-06-14 LAB — DRUG TOX ALC METAB W/CON, ORAL FLD: Alcohol Metabolite: NEGATIVE ng/mL (ref <25)

## 2024-06-17 ENCOUNTER — Other Ambulatory Visit: Payer: Self-pay | Admitting: Registered Nurse

## 2024-06-17 DIAGNOSIS — E119 Type 2 diabetes mellitus without complications: Secondary | ICD-10-CM | POA: Diagnosis not present

## 2024-06-21 ENCOUNTER — Ambulatory Visit
Admission: RE | Admit: 2024-06-21 | Discharge: 2024-06-21 | Disposition: A | Discharge status: Home / Self Care | Source: Ambulatory Visit | Attending: Internal Medicine | Admitting: Internal Medicine

## 2024-06-21 DIAGNOSIS — Z1231 Encounter for screening mammogram for malignant neoplasm of breast: Secondary | ICD-10-CM | POA: Diagnosis not present

## 2024-06-25 DIAGNOSIS — G500 Trigeminal neuralgia: Secondary | ICD-10-CM | POA: Diagnosis not present

## 2024-07-04 ENCOUNTER — Other Ambulatory Visit: Payer: Self-pay

## 2024-07-04 MED ORDER — BUDESONIDE-FORMOTEROL FUMARATE 160-4.5 MCG/ACT IN AERO: 2.0000 | INHALATION_SPRAY | Freq: Every day | RESPIRATORY_TRACT | 12 refills | Status: AC

## 2024-07-04 NOTE — Telephone Encounter (Signed)
 Rx sent electronically.

## 2024-07-05 ENCOUNTER — Telehealth: Payer: Self-pay

## 2024-07-05 MED ORDER — MONTELUKAST SODIUM 10 MG PO TABS: 10.0000 mg | ORAL_TABLET | Freq: Every day | ORAL | 1 refills | Status: AC

## 2024-07-05 MED ORDER — TRAZODONE HCL 50 MG PO TABS: ORAL_TABLET | ORAL | 1 refills | Status: DC

## 2024-07-05 NOTE — Addendum Note (Signed)
 Addended by: KALLIE CLOTILDA SQUIBB on: 07/05/2024 12:44 PM   Modules accepted: Orders

## 2024-07-05 NOTE — Telephone Encounter (Signed)
 Copied from CRM [id number]. Topic: General - Other >> Jul 05, 2024  2:35 PM [name] wrote: Reason for CRM: Pt states [employer] needs something sent over to them in regards to her diabetes supplies and company to manage her for her diabetes. Pt states the insurance does not want to cover her diabetes supplies lancets and needles,  she is asking if you can please send them Medical necessities letter for management of diabetes to [employer] to continue getting coverage.  Please reach out to pt with updated status please.

## 2024-07-06 NOTE — Telephone Encounter (Signed)
 Called and spoke to pt to get more information on what product she is using. Insurance usually only covers a specific brand. She said she has been talking to a company called [employer]. They sent her a monitor and supplies. She checks her blood sugar and the information gets sent to them. They call her once a month and discuss everything and advised her from there.   I advised her I needed to investigate this more. I am afraid of Medicare Fraud when it comes to things like this. I will get back with her once I have been able to look in to it more.

## 2024-07-10 ENCOUNTER — Encounter: Payer: Worker's Compensation | Attending: Registered Nurse | Admitting: Registered Nurse

## 2024-07-10 VITALS — BP 111/77 | HR 93 | Ht 67.0 in | Wt 162.0 lb

## 2024-07-10 DIAGNOSIS — W19XXXA Unspecified fall, initial encounter: Secondary | ICD-10-CM | POA: Diagnosis not present

## 2024-07-10 DIAGNOSIS — Z5181 Encounter for therapeutic drug level monitoring: Secondary | ICD-10-CM | POA: Diagnosis not present

## 2024-07-10 DIAGNOSIS — M62838 Other muscle spasm: Secondary | ICD-10-CM | POA: Insufficient documentation

## 2024-07-10 DIAGNOSIS — Z79891 Long term (current) use of opiate analgesic: Secondary | ICD-10-CM | POA: Insufficient documentation

## 2024-07-10 DIAGNOSIS — F419 Anxiety disorder, unspecified: Secondary | ICD-10-CM | POA: Diagnosis not present

## 2024-07-10 DIAGNOSIS — W19XXXD Unspecified fall, subsequent encounter: Secondary | ICD-10-CM

## 2024-07-10 DIAGNOSIS — G90512 Complex regional pain syndrome I of left upper limb: Secondary | ICD-10-CM | POA: Diagnosis present

## 2024-07-10 DIAGNOSIS — R490 Dysphonia: Secondary | ICD-10-CM | POA: Diagnosis not present

## 2024-07-10 DIAGNOSIS — Y92009 Unspecified place in unspecified non-institutional (private) residence as the place of occurrence of the external cause: Secondary | ICD-10-CM | POA: Diagnosis not present

## 2024-07-10 DIAGNOSIS — G43009 Migraine without aura, not intractable, without status migrainosus: Secondary | ICD-10-CM | POA: Diagnosis not present

## 2024-07-10 DIAGNOSIS — G894 Chronic pain syndrome: Secondary | ICD-10-CM | POA: Insufficient documentation

## 2024-07-10 DIAGNOSIS — Z79899 Other long term (current) drug therapy: Secondary | ICD-10-CM | POA: Insufficient documentation

## 2024-07-10 DIAGNOSIS — G4700 Insomnia, unspecified: Secondary | ICD-10-CM | POA: Diagnosis not present

## 2024-07-10 DIAGNOSIS — G500 Trigeminal neuralgia: Secondary | ICD-10-CM | POA: Insufficient documentation

## 2024-07-10 DIAGNOSIS — Y93E1 Activity, personal bathing and showering: Secondary | ICD-10-CM | POA: Diagnosis not present

## 2024-07-10 DIAGNOSIS — G501 Atypical facial pain: Secondary | ICD-10-CM | POA: Diagnosis not present

## 2024-07-10 DIAGNOSIS — F32A Depression, unspecified: Secondary | ICD-10-CM | POA: Diagnosis not present

## 2024-07-10 MED ORDER — HYDROCODONE-ACETAMINOPHEN 10-325 MG PO TABS: 1.0000 | ORAL_TABLET | Freq: Every day | ORAL | 0 refills | Status: DC | PRN

## 2024-07-10 MED ORDER — TIZANIDINE HCL 4 MG PO TABS: 4.0000 mg | ORAL_TABLET | Freq: Four times a day (QID) | ORAL | 3 refills | Status: AC | PRN

## 2024-07-10 NOTE — Progress Notes (Signed)
 Subjective:    Patient ID: [name], female    DOB: 03-29-1966, 58 y.o.   MRN: [id number]  HPI: [name] is a 58 y.o. female who returns for follow up appointment for chronic pain and medication refill. She states her pain is located in her left arm and left hand. She rates her pain 7. Her current exercise regime is walking short distances and performing stretching exercises.  [name] reports she fell while getting out of the shower, she states she became dizzy and fell on her left side. She denies any dizziness at this time. She was educated on falls prevention, she verbalizes understanding. She will F/up with Neurology.   [name] Morphine  equivalent is 50.00 MME.   Last Oral Swab was Performed on 06/11/2024, it was consistent.     Pain Inventory Average Pain 7 Pain Right Now 7 My pain is constant, sharp, burning, stabbing, tingling, and aching  In the last 24 hours, has pain interfered with the following? General activity 6 Relation with others 6 Enjoyment of life 6 What TIME of day is your pain at its worst? morning , daytime, evening, and night Sleep (in general) Poor  Pain is worse with: walking, bending, sitting, standing, and some activites Pain improves with: rest, pacing activities, medication, and TENS Relief from Meds: 6  Family History  Problem Relation Age of Onset   Hypertension Mother    Lung cancer Mother    Colon cancer Father    Hypertension Sister    Cancer Sister    Hyperlipidemia Sister    Breast cancer Sister 50   Breast cancer Paternal Aunt    Birth defects Maternal Grandmother    Breast cancer Maternal Grandmother    Ovarian cancer Maternal Grandmother    Birth defects Paternal Grandmother        uterine, stomach, lung   Breast cancer Paternal Grandmother    Heart attack Paternal Grandfather    Heart failure Paternal Grandfather    Cancer Brother    Social History   Socioeconomic History    Marital status: Married    Spouse name: Not on file   Number of children: 2   Years of education: Not on file   Highest education level: Not on file  Occupational History   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  Tobacco Use   Smoking status: Never    Passive exposure: Current   Smokeless tobacco: Never  Vaping Use   Vaping status: Never Used  Substance and Sexual Activity   Alcohol  use: Not Currently    Comment: 01/24/2013 drink or 2 once or /twice/year, if that   Drug use: No   Sexual activity: Yes    Birth control/protection: None  Other Topics Concern   Not on file  Social History Narrative   No living will   Would want husband as POA--then sister, Barnie   Would accept resuscitation attempts   Probably would not want tube feeds if cognitively unaware   Pt lives with family    Pt doesn't work    Social Drivers of Corporate investment banker Strain: Not on file  Food Insecurity: Not on file  Transportation Needs: Not on file  Physical Activity: Not on file  Stress: Not on file  Social Connections: Not on file   Past Surgical History:  Procedure Laterality Date   APPENDECTOMY  ~ 06/2007   BLADDER REPAIR  ~ 06/2007   same day after bladder lift (01/24/2013)  BLADDER SUSPENSION  ~ 06/2007   BREAST BIOPSY Left    DIAGNOSTIC LAPAROSCOPY  1990's & ~ 2000   I've had a couple; for endometrosis (01/24/2013)   HERNIA REPAIR     INCISIONAL HERNIA REPAIR N/A 01/24/2013   Procedure: LAPAROSCOPIC INCISIONAL HERNIA;  Surgeon: Vicenta DELENA Poli, MD;  Location: [facility] OR;  Service: General;  Laterality: N/A;   INSERTION OF MESH N/A 01/24/2013   Procedure: INSERTION OF MESH;  Surgeon: Vicenta DELENA Poli, MD;  Location: [facility] OR;  Service: General;  Laterality: N/A;   LAPAROSCOPIC INCISIONAL / UMBILICAL / VENTRAL HERNIA REPAIR  01/24/2013   IHR w/mesh/notes   NASAL SEPTUM SURGERY  1980's?   OOPHORECTOMY Right 2009   TONSILLECTOMY  1990's   VAGINAL HYSTERECTOMY  ` 06/2007   Past Surgical  History:  Procedure Laterality Date   APPENDECTOMY  ~ 06/2007   BLADDER REPAIR  ~ 06/2007   same day after bladder lift (01/24/2013)   BLADDER SUSPENSION  ~ 06/2007   BREAST BIOPSY Left    DIAGNOSTIC LAPAROSCOPY  1990's & ~ 2000   I've had a couple; for endometrosis (01/24/2013)   HERNIA REPAIR     INCISIONAL HERNIA REPAIR N/A 01/24/2013   Procedure: LAPAROSCOPIC INCISIONAL HERNIA;  Surgeon: Vicenta DELENA Poli, MD;  Location: [facility];  Service: General;  Laterality: N/A;   INSERTION OF MESH N/A 01/24/2013   Procedure: INSERTION OF MESH;  Surgeon: Vicenta DELENA Poli, MD;  Location: [facility];  Service: General;  Laterality: N/A;   LAPAROSCOPIC INCISIONAL / UMBILICAL / VENTRAL HERNIA REPAIR  01/24/2013   IHR w/mesh/notes   NASAL SEPTUM SURGERY  1980's?   OOPHORECTOMY Right 2009   TONSILLECTOMY  1990's   VAGINAL HYSTERECTOMY  ` 06/2007   Past Medical History:  Diagnosis Date   Allergy    Anxiety    Arthritis    just the norm (01/24/2013)   ASCVD (arteriosclerotic cardiovascular disease)    Asthma    Celiac disease    Daily headache    depends on the season (01/24/2013)   Diabetes mellitus without complication (HCC)    Fibromyalgia    GERD (gastroesophageal reflux disease)    History of colonic polyps    Hyperlipidemia    Hypertension    Migraines    Obesity (BMI 30.0-34.9)    RSD (reflex sympathetic dystrophy)    Sleep apnea    NO MACHINE RECOMMENDED   TIA (transient ischemic attack) ~ 2009   BP 111/77   Pulse 93   Ht 5' 7 (1.702 m)   Wt 162 lb (73.5 kg)   SpO2 98%   BMI 25.37 kg/m   Opioid Risk Score:   Fall Risk Score:  `1  Depression screen [facility] 2/9     06/11/2024    3:16 PM 04/06/2024   10:11 AM 02/27/2024    2:19 PM 02/22/2024    1:21 PM 12/09/2023    1:03 PM 11/08/2023    1:29 PM 10/10/2023    1:14 PM  Depression screen PHQ 2/9  Decreased Interest 1 0 0 0 1 1 1   Down, Depressed, Hopeless 1 0 0 0 1 1 1   PHQ - 2 Score 2 0 0 0 2 2 2      Review of Systems   Musculoskeletal:        Left arm pain  All other systems reviewed and are negative.      Objective:   Physical Exam Vitals and nursing note reviewed.  Constitutional:  Appearance: Normal appearance.  Neck:     Comments: Cervical Paraspinal Tenderness: C-5-C-6 Cardiovascular:     Rate and Rhythm: Normal rate and regular rhythm.     Pulses: Normal pulses.     Heart sounds: Normal heart sounds.  Pulmonary:     Effort: Pulmonary effort is normal.     Breath sounds: Normal breath sounds.  Musculoskeletal:     Comments: Normal Muscle Bulk and Muscle Testing Reveals:  Upper Extremities: Right: Full ROM and Muscle Strength 5/5 Left Upper Extremity: Decreased ROM 90 Degrees and Muscle Strength 3/5 Bilateral AC Joint Tenderness  Lower Extremities: Full ROM and Muscle Strength 5/5 Bilateral Lower Extremities Flexion Produces Pain into her Bilateral Lower Extremities and Bilateral feet Arises from Table slowly Antalgic  Gait     Skin:    General: Skin is warm and dry.  Neurological:     Mental Status: She is alert and oriented to person, place, and time.  Psychiatric:        Mood and Affect: Mood normal.        Behavior: Behavior normal.          Assessment & Plan:  1.Cervicalgia/ Cervical Radiculitis:  No Complaints today. Continue to Monitor. 07/10/2024 2.Complex Regional Pain Syndrome Type 1: Continue Topamax  and Cymbalta . 07/10/2024.  Refilled: Hydro-codone 10/325mg  one tablet  5 times  a day as needed for pain #150 and  Tramadol   was discontinued due contraindicated with Tegretol  being prescribed by neurology. We will continue the opioid monitoring program, this consists of regular clinic visits, examinations, urine drug screen, pill counts as well as use of [place]  Controlled Substance Reporting system. A 12 month History has been reviewed on the [place]  Controlled Substance Reporting System  On 07/10/2024. 3. Depression: with Physical Illness:  Continue  current medication regimen with Cymbalta .07/10/2024 4. Insomnia: Continue current medication regimen with  Trazodone  one and half tablet at bedtime  09/23//2025 5. Muscle Spasms: Continue current medication regimen. 09/232025 6. Chronic Pain Syndrome: Continue Ibuprofen  BID as needed. 07/10/2024 7. Migraine without Aura: Continue Topamax : Continue to Monitor. 07/10/2024 8. Dysphonia: She reports she was seen by ENT. S PCP Following. Continue to monitor.  07/10/2024 9. Trigeminal Neuralgia: Ms. Sallye Racer reports her dentist Dr Raynaldo diagnose her with Trigeminal Neuralgia. Neurology Following.  07/10/2024 10. Fall in Home subsequent encounter: Educated on [employer]. She verbalizes understanding. She will also F/U with PCP and Neurology, she verbalizes understanding.   F/U in 1 month

## 2024-07-13 ENCOUNTER — Other Ambulatory Visit: Payer: Self-pay

## 2024-07-13 MED ORDER — METFORMIN HCL ER 500 MG PO TB24: 1000.0000 mg | ORAL_TABLET | Freq: Every day | ORAL | 0 refills | Status: AC

## 2024-07-13 NOTE — Telephone Encounter (Signed)
 Rx sent electronically.

## 2024-07-17 ENCOUNTER — Ambulatory Visit (INDEPENDENT_AMBULATORY_CARE_PROVIDER_SITE_OTHER)

## 2024-07-17 DIAGNOSIS — E1169 Type 2 diabetes mellitus with other specified complication: Secondary | ICD-10-CM | POA: Diagnosis not present

## 2024-07-17 DIAGNOSIS — E538 Deficiency of other specified B group vitamins: Secondary | ICD-10-CM | POA: Diagnosis not present

## 2024-07-17 MED ORDER — CYANOCOBALAMIN 1000 MCG/ML IJ SOLN
1000.0000 ug | Freq: Once | INTRAMUSCULAR | Status: AC
  Administered 2024-07-17: 1000 ug via INTRAMUSCULAR

## 2024-07-17 NOTE — Progress Notes (Signed)
 Per orders of Dr. Cleatus, injection of monthly B12 1000 mcg/ml given by Clotilda SHAUNNA Pander, CMA Patient tolerated injection well.

## 2024-07-23 ENCOUNTER — Other Ambulatory Visit: Payer: Self-pay

## 2024-07-23 MED ORDER — OLMESARTAN MEDOXOMIL 40 MG PO TABS: 40.0000 mg | ORAL_TABLET | Freq: Every day | ORAL | 0 refills | Status: DC

## 2024-07-27 DIAGNOSIS — G527 Disorders of multiple cranial nerves: Secondary | ICD-10-CM | POA: Diagnosis not present

## 2024-07-27 DIAGNOSIS — G500 Trigeminal neuralgia: Secondary | ICD-10-CM | POA: Diagnosis not present

## 2024-07-31 NOTE — Progress Notes (Signed)
 Subjective:    Patient ID: [name], female    DOB: 24-Sep-1966, 58 y.o.   MRN: [id number]  HPI: [name] is a 58 y.o. female who returns for follow up appointment for chronic pain and medication refill. She states her pain is located in her left arm and left hand.  She also reports she has neck pain and mid- lower back pain. Reports receiving 3- 4 hours relief with her current medication regimen. Her current exercise regimen is walking short distances  [name] Morphine  equivalent is 50.00 MME.   Last Oral Swab was Performed 06/11/2024, it was consistent.     Pain Inventory Average Pain 7 Pain Right Now 8 My pain is sharp, burning, stabbing, tingling, and aching  In the last 24 hours, has pain interfered with the following? General activity 6 Relation with others 6 Enjoyment of life 6 What TIME of day is your pain at its worst? morning , daytime, evening, and night Sleep (in general) Poor  Pain is worse with: walking, bending, sitting, standing, and some activites Pain improves with: rest, pacing activities, medication, and TENS Relief from Meds: 6  Family History  Problem Relation Age of Onset   Hypertension Mother    Lung cancer Mother    Colon cancer Father    Hypertension Sister    Cancer Sister    Hyperlipidemia Sister    Breast cancer Sister 70   Breast cancer Paternal Aunt    Birth defects Maternal Grandmother    Breast cancer Maternal Grandmother    Ovarian cancer Maternal Grandmother    Birth defects Paternal Grandmother        uterine, stomach, lung   Breast cancer Paternal Grandmother    Heart attack Paternal Grandfather    Heart failure Paternal Grandfather    Cancer Brother    Social History   Socioeconomic History   Marital status: Married    Spouse name: Not on file   Number of children: 2   Years of education: Not on file   Highest education level: Not on file  Occupational History   Occupation: Disabled  due to RSD  Tobacco Use   Smoking status: Never    Passive exposure: Current   Smokeless tobacco: Never  Vaping Use   Vaping status: Never Used  Substance and Sexual Activity   Alcohol  use: Not Currently    Comment: 01/24/2013 drink or 2 once or /twice/year, if that   Drug use: No   Sexual activity: Yes    Birth control/protection: None  Other Topics Concern   Not on file  Social History Narrative   No living will   Would want husband as POA--then sister, Barnie   Would accept resuscitation attempts   Probably would not want tube feeds if cognitively unaware   Pt lives with family    Pt doesn't work    Social Drivers of Corporate investment banker Strain: Not on file  Food Insecurity: Not on file  Transportation Needs: Not on file  Physical Activity: Not on file  Stress: Not on file  Social Connections: Not on file   Past Surgical History:  Procedure Laterality Date   APPENDECTOMY  ~ 06/2007   BLADDER REPAIR  ~ 06/2007   same day after bladder lift (01/24/2013)   BLADDER SUSPENSION  ~ 06/2007   BREAST BIOPSY Left    DIAGNOSTIC LAPAROSCOPY  1990's & ~ 2000   I've had a couple; for endometrosis (01/24/2013)  HERNIA REPAIR     INCISIONAL HERNIA REPAIR N/A 01/24/2013   Procedure: LAPAROSCOPIC INCISIONAL HERNIA;  Surgeon: Vicenta DELENA Poli, MD;  Location: [facility] OR;  Service: General;  Laterality: N/A;   INSERTION OF MESH N/A 01/24/2013   Procedure: INSERTION OF MESH;  Surgeon: Vicenta DELENA Poli, MD;  Location: [facility];  Service: General;  Laterality: N/A;   LAPAROSCOPIC INCISIONAL / UMBILICAL / VENTRAL HERNIA REPAIR  01/24/2013   IHR w/mesh/notes   NASAL SEPTUM SURGERY  1980's?   OOPHORECTOMY Right 2009   TONSILLECTOMY  1990's   VAGINAL HYSTERECTOMY  ` 06/2007   Past Surgical History:  Procedure Laterality Date   APPENDECTOMY  ~ 06/2007   BLADDER REPAIR  ~ 06/2007   same day after bladder lift (01/24/2013)   BLADDER SUSPENSION  ~ 06/2007   BREAST BIOPSY Left    DIAGNOSTIC  LAPAROSCOPY  1990's & ~ 2000   I've had a couple; for endometrosis (01/24/2013)   HERNIA REPAIR     INCISIONAL HERNIA REPAIR N/A 01/24/2013   Procedure: LAPAROSCOPIC INCISIONAL HERNIA;  Surgeon: Vicenta DELENA Poli, MD;  Location: [facility];  Service: General;  Laterality: N/A;   INSERTION OF MESH N/A 01/24/2013   Procedure: INSERTION OF MESH;  Surgeon: Vicenta DELENA Poli, MD;  Location: [facility];  Service: General;  Laterality: N/A;   LAPAROSCOPIC INCISIONAL / UMBILICAL / VENTRAL HERNIA REPAIR  01/24/2013   IHR w/mesh/notes   NASAL SEPTUM SURGERY  1980's?   OOPHORECTOMY Right 2009   TONSILLECTOMY  1990's   VAGINAL HYSTERECTOMY  ` 06/2007   Past Medical History:  Diagnosis Date   Allergy    Anxiety    Arthritis    just the norm (01/24/2013)   ASCVD (arteriosclerotic cardiovascular disease)    Asthma    Celiac disease    Daily headache    depends on the season (01/24/2013)   Diabetes mellitus without complication (HCC)    Fibromyalgia    GERD (gastroesophageal reflux disease)    History of colonic polyps    Hyperlipidemia    Hypertension    Migraines    Obesity (BMI 30.0-34.9)    RSD (reflex sympathetic dystrophy)    Sleep apnea    NO MACHINE RECOMMENDED   TIA (transient ischemic attack) ~ 2009   There were no vitals taken for this visit.  Opioid Risk Score:   Fall Risk Score:  `1  Depression screen [facility] 2/9     06/11/2024    3:16 PM 04/06/2024   10:11 AM 02/27/2024    2:19 PM 02/22/2024    1:21 PM 12/09/2023    1:03 PM 11/08/2023    1:29 PM 10/10/2023    1:14 PM  Depression screen PHQ 2/9  Decreased Interest 1 0 0 0 1 1 1   Down, Depressed, Hopeless 1 0 0 0 1 1 1   PHQ - 2 Score 2 0 0 0 2 2 2     Review of Systems     Objective:   Physical Exam Vitals and nursing note reviewed.  Constitutional:      Appearance: Normal appearance.  Cardiovascular:     Rate and Rhythm: Normal rate and regular rhythm.     Pulses: Normal pulses.     Heart sounds: Normal heart sounds.   Pulmonary:     Effort: Pulmonary effort is normal.     Breath sounds: Normal breath sounds.  Musculoskeletal:     Comments: Normal Muscle Bulk and Muscle Testing Reveals:  Upper Extremities: Right: Decreased  ROM 90 Degrees  and Muscle Strength  5/5 Left Upper Extremity: Decreased ROM 45 Degrees and Muscle Strength 5/5 Thoracic and Lumbar Hypersensitivity Lower Extremities: Decreased ROM and Muscle Strength 5/5 Bilateral Lower Extremities Flexion Produces Pain into her Bilateral Lower Extremities Arises from  Table Slowly Antalgic Gait     Skin:    General: Skin is warm and dry.  Neurological:     Mental Status: She is alert and oriented to person, place, and time.  Psychiatric:        Mood and Affect: Mood normal.        Behavior: Behavior normal.          Assessment & Plan:  1.Cervicalgia/ Cervical Radiculitis:  No Complaints today. Continue to Monitor. 08/01/2024 2.Complex Regional Pain Syndrome Type 1: Continue Topamax  and Cymbalta . 08/01/2024.  RX: Oxycodone   10/325mg  one tablet  4 times  a day as needed for pain #120, Hydrocodone  discontinued  and  Tramadol   was discontinued due contraindicated with Tegretol  being prescribed by neurology. We will continue the opioid monitoring program, this consists of regular clinic visits, examinations, urine drug screen, pill counts as well as use of [place]  Controlled Substance Reporting system. A 12 month History has been reviewed on the   Controlled Substance Reporting System  On 08/01/2024. 3. Depression: with Physical Illness:  Continue current medication regimen with Cymbalta .08/01/2024 4. Insomnia: Continue current medication regimen with  Trazodone  one and half tablet at bedtime  10/15//2025 5. Muscle Spasms: Continue current medication regimen. 08/01/2024 6. Chronic Pain Syndrome: Continue Ibuprofen  BID as needed. 08/01/2024 7. Migraine without Aura: Continue Topamax : Continue to Monitor. 08/01/2024 8.  Dysphonia: She reports she was seen by ENT. S PCP Following. Continue to monitor.  1015/2025 9. Trigeminal Neuralgia: [name] reports her dentist Dr Raynaldo diagnose her with Trigeminal Neuralgia. Neurology Following.  08/01/2024  F/U in 1 month

## 2024-08-01 ENCOUNTER — Encounter: Payer: Self-pay | Admitting: Registered Nurse

## 2024-08-01 ENCOUNTER — Encounter: Payer: Worker's Compensation | Attending: Registered Nurse | Admitting: Registered Nurse

## 2024-08-01 VITALS — BP 115/71 | HR 82 | Ht 67.0 in | Wt 165.0 lb

## 2024-08-01 DIAGNOSIS — G90512 Complex regional pain syndrome I of left upper limb: Secondary | ICD-10-CM | POA: Insufficient documentation

## 2024-08-01 DIAGNOSIS — M25512 Pain in left shoulder: Secondary | ICD-10-CM | POA: Insufficient documentation

## 2024-08-01 DIAGNOSIS — G894 Chronic pain syndrome: Secondary | ICD-10-CM | POA: Diagnosis not present

## 2024-08-01 DIAGNOSIS — M25511 Pain in right shoulder: Secondary | ICD-10-CM | POA: Insufficient documentation

## 2024-08-01 DIAGNOSIS — G8929 Other chronic pain: Secondary | ICD-10-CM | POA: Insufficient documentation

## 2024-08-01 DIAGNOSIS — Z5181 Encounter for therapeutic drug level monitoring: Secondary | ICD-10-CM | POA: Insufficient documentation

## 2024-08-01 DIAGNOSIS — Z79891 Long term (current) use of opiate analgesic: Secondary | ICD-10-CM | POA: Diagnosis not present

## 2024-08-01 DIAGNOSIS — M546 Pain in thoracic spine: Secondary | ICD-10-CM | POA: Diagnosis present

## 2024-08-01 MED ORDER — OXYCODONE-ACETAMINOPHEN 10-325 MG PO TABS: 1.0000 | ORAL_TABLET | Freq: Four times a day (QID) | ORAL | 0 refills | Status: DC | PRN

## 2024-08-13 ENCOUNTER — Telehealth: Payer: Self-pay

## 2024-08-13 NOTE — Telephone Encounter (Signed)
 Copied from CRM #[id number]. Topic: Appointments - Scheduling Inquiry for Clinic >> Aug 13, 2024 12:43 PM Victoria A wrote: Reason for CRM: Renay requested patient's PE be moved to 3PM on 08/31/24 and patient agreed that 3PM was ok however 3PM is not available appointment stop after October then go to December.

## 2024-08-16 ENCOUNTER — Ambulatory Visit

## 2024-08-16 DIAGNOSIS — E538 Deficiency of other specified B group vitamins: Secondary | ICD-10-CM

## 2024-08-16 MED ORDER — CYANOCOBALAMIN 1000 MCG/ML IJ SOLN
1000.0000 ug | Freq: Once | INTRAMUSCULAR | Status: AC
  Administered 2024-08-16: 1000 ug via INTRAMUSCULAR

## 2024-08-16 NOTE — Progress Notes (Signed)
Patient presented for B 12 injection given by Rhylan Kagel, CMA to left deltoid, patient voiced no concerns nor showed any signs of distress during injection.  

## 2024-08-23 ENCOUNTER — Encounter: Payer: Worker's Compensation | Attending: Registered Nurse | Admitting: Registered Nurse

## 2024-08-23 ENCOUNTER — Encounter: Payer: Self-pay | Admitting: Registered Nurse

## 2024-08-23 VITALS — BP 131/86 | HR 78 | Ht 67.0 in | Wt 164.0 lb

## 2024-08-23 DIAGNOSIS — F3289 Other specified depressive episodes: Secondary | ICD-10-CM | POA: Insufficient documentation

## 2024-08-23 DIAGNOSIS — Z5181 Encounter for therapeutic drug level monitoring: Secondary | ICD-10-CM | POA: Diagnosis present

## 2024-08-23 DIAGNOSIS — M25511 Pain in right shoulder: Secondary | ICD-10-CM | POA: Diagnosis present

## 2024-08-23 DIAGNOSIS — G90512 Complex regional pain syndrome I of left upper limb: Secondary | ICD-10-CM | POA: Diagnosis present

## 2024-08-23 DIAGNOSIS — M546 Pain in thoracic spine: Secondary | ICD-10-CM | POA: Insufficient documentation

## 2024-08-23 DIAGNOSIS — Z79891 Long term (current) use of opiate analgesic: Secondary | ICD-10-CM | POA: Diagnosis not present

## 2024-08-23 DIAGNOSIS — G8929 Other chronic pain: Secondary | ICD-10-CM | POA: Diagnosis present

## 2024-08-23 DIAGNOSIS — G4700 Insomnia, unspecified: Secondary | ICD-10-CM | POA: Insufficient documentation

## 2024-08-23 DIAGNOSIS — M25512 Pain in left shoulder: Secondary | ICD-10-CM | POA: Diagnosis present

## 2024-08-23 DIAGNOSIS — G894 Chronic pain syndrome: Secondary | ICD-10-CM | POA: Diagnosis present

## 2024-08-23 MED ORDER — OXYCODONE-ACETAMINOPHEN 10-325 MG PO TABS: 1.0000 | ORAL_TABLET | Freq: Four times a day (QID) | ORAL | 0 refills | Status: DC | PRN

## 2024-08-23 NOTE — Progress Notes (Signed)
 Subjective:    Patient ID: [name], female    DOB: [birth year], 58 y.o.   MRN: [id number]  HPI: [name] is a 58 y.o. female who returns for follow up appointment for chronic pain and medication refill. She states her pain is located in her left arm and left hand. She rates her pain 5. Her current exercise regime is walking short distances.  [name] Morphine  equivalent is 60.00 MME.   Oral Swab was Performed today.    Pain Inventory Average Pain 7-8 Pain Right Now 5 My pain is constant, sharp, burning, stabbing, tingling, and aching  In the last 24 hours, has pain interfered with the following? General activity 8 Relation with others 8 Enjoyment of life 8 What TIME of day is your pain at its worst? morning , daytime, evening, and night Sleep (in general) Poor  Pain is worse with: walking, bending, sitting, inactivity, standing, and some activites Pain improves with: heat/ice, therapy/exercise, medication, and TENS Relief from Meds: 7  Family History  Problem Relation Age of Onset   Hypertension Mother    Lung cancer Mother    Colon cancer Father    Hypertension Sister    Cancer Sister    Hyperlipidemia Sister    Breast cancer Sister 71   Breast cancer Paternal Aunt    Birth defects Maternal Grandmother    Breast cancer Maternal Grandmother    Ovarian cancer Maternal Grandmother    Birth defects Paternal Grandmother        uterine, stomach, lung   Breast cancer Paternal Grandmother    Heart attack Paternal Grandfather    Heart failure Paternal Grandfather    Cancer Brother    Social History   Socioeconomic History   Marital status: Married    Spouse name: Not on file   Number of children: 2   Years of education: Not on file   Highest education level: Not on file  Occupational History   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  Tobacco Use   Smoking status: Never    Passive exposure: Current   Smokeless tobacco: Never  Vaping  Use   Vaping status: Never Used  Substance and Sexual Activity   Alcohol  use: Not Currently    Comment: 01/24/2013 drink or 2 once or /twice/year, if that   Drug use: No   Sexual activity: Yes    Birth control/protection: None  Other Topics Concern   Not on file  Social History Narrative   No living will   Would want husband as POA--then sister, Barnie   Would accept resuscitation attempts   Probably would not want tube feeds if cognitively unaware   Pt lives with family    Pt doesn't work    Social Drivers of Corporate Investment Banker Strain: Not on file  Food Insecurity: Not on file  Transportation Needs: Not on file  Physical Activity: Not on file  Stress: Not on file  Social Connections: Not on file   Past Surgical History:  Procedure Laterality Date   APPENDECTOMY  ~ 06/2007   BLADDER REPAIR  ~ 06/2007   same day after bladder lift (01/24/2013)   BLADDER SUSPENSION  ~ 06/2007   BREAST BIOPSY Left    DIAGNOSTIC LAPAROSCOPY  1990's & ~ 2000   I've had a couple; for endometrosis (01/24/2013)   HERNIA REPAIR     INCISIONAL HERNIA REPAIR N/A 01/24/2013   Procedure: LAPAROSCOPIC INCISIONAL HERNIA;  Surgeon: Vicenta DELENA Poli,  MD;  Location: [facility] OR;  Service: General;  Laterality: N/A;   INSERTION OF MESH N/A 01/24/2013   Procedure: INSERTION OF MESH;  Surgeon: Vicenta DELENA Poli, MD;  Location: [facility] OR;  Service: General;  Laterality: N/A;   LAPAROSCOPIC INCISIONAL / UMBILICAL / VENTRAL HERNIA REPAIR  01/24/2013   IHR w/mesh/notes   NASAL SEPTUM SURGERY  1980's?   OOPHORECTOMY Right 2009   TONSILLECTOMY  1990's   VAGINAL HYSTERECTOMY  ` 06/2007   Past Surgical History:  Procedure Laterality Date   APPENDECTOMY  ~ 06/2007   BLADDER REPAIR  ~ 06/2007   same day after bladder lift (01/24/2013)   BLADDER SUSPENSION  ~ 06/2007   BREAST BIOPSY Left    DIAGNOSTIC LAPAROSCOPY  1990's & ~ 2000   I've had a couple; for endometrosis (01/24/2013)   HERNIA REPAIR     INCISIONAL HERNIA  REPAIR N/A 01/24/2013   Procedure: LAPAROSCOPIC INCISIONAL HERNIA;  Surgeon: Vicenta DELENA Poli, MD;  Location: [facility];  Service: General;  Laterality: N/A;   INSERTION OF MESH N/A 01/24/2013   Procedure: INSERTION OF MESH;  Surgeon: Vicenta DELENA Poli, MD;  Location: [facility];  Service: General;  Laterality: N/A;   LAPAROSCOPIC INCISIONAL / UMBILICAL / VENTRAL HERNIA REPAIR  01/24/2013   IHR w/mesh/notes   NASAL SEPTUM SURGERY  1980's?   OOPHORECTOMY Right 2009   TONSILLECTOMY  1990's   VAGINAL HYSTERECTOMY  ` 06/2007   Past Medical History:  Diagnosis Date   Allergy    Anxiety    Arthritis    just the norm (01/24/2013)   ASCVD (arteriosclerotic cardiovascular disease)    Asthma    Celiac disease    Daily headache    depends on the season (01/24/2013)   Diabetes mellitus without complication (HCC)    Fibromyalgia    GERD (gastroesophageal reflux disease)    History of colonic polyps    Hyperlipidemia    Hypertension    Migraines    Obesity (BMI 30.0-34.9)    RSD (reflex sympathetic dystrophy)    Sleep apnea    NO MACHINE RECOMMENDED   TIA (transient ischemic attack) ~ 2009   BP 131/86 (BP Location: Left Arm, Patient Position: Sitting, Cuff Size: Normal)   Pulse 78   Ht 5' 7 (1.702 m)   Wt 164 lb (74.4 kg)   SpO2 (!) 78%   BMI 25.69 kg/m   Opioid Risk Score:   Fall Risk Score:  `1  Depression screen [facility] 2/9     08/23/2024    2:53 PM 06/11/2024    3:16 PM 04/06/2024   10:11 AM 02/27/2024    2:19 PM 02/22/2024    1:21 PM 12/09/2023    1:03 PM 11/08/2023    1:29 PM  Depression screen PHQ 2/9  Decreased Interest 1 1 0 0 0 1 1  Down, Depressed, Hopeless 0 1 0 0 0 1 1  PHQ - 2 Score 1 2 0 0 0 2 2      Review of Systems  Musculoskeletal:  Positive for myalgias.       Left arm pain  All other systems reviewed and are negative.      Objective:   Physical Exam Vitals and nursing note reviewed.  Constitutional:      Appearance: Normal appearance.  Cardiovascular:      Rate and Rhythm: Normal rate and regular rhythm.     Pulses: Normal pulses.     Heart sounds: Normal heart sounds.  Pulmonary:  Effort: Pulmonary effort is normal.     Breath sounds: Normal breath sounds.  Musculoskeletal:     Comments: Normal Muscle Bulk and Muscle Testing Reveals:  Upper Extremities: Right: Full ROM and Muscle Strength 5/5 Left Upper Extremity: Decreased ROM 45 Degrees and Muscle Strength 3/5 Bilateral AC Joint Tenderness Thoracic and Lumbar Hypersensitivity Bilateral Greater Trochanter Tenderness Lower Extremities : Decreased ROM and Muscle Strength  5/5 Arises from Table slowly, Antalgic Gait     Skin:    General: Skin is warm and dry.  Neurological:     Mental Status: She is alert and oriented to person, place, and time.  Psychiatric:        Mood and Affect: Mood normal.        Behavior: Behavior normal.          Assessment & Plan:  1.Cervicalgia/ Cervical Radiculitis:  No Complaints today. Continue to Monitor. 08/23/2024 2.Complex Regional Pain Syndrome Type 1: Continue Topamax  and Cymbalta . 08/23/2024.  RX: Oxycodone   10/325mg  one tablet  4 times  a day as needed for pain #120, Hydrocodone  discontinued  and  Tramadol   was discontinued due contraindicated with Tegretol  being prescribed by neurology. We will continue the opioid monitoring program, this consists of regular clinic visits, examinations, urine drug screen, pill counts as well as use of [place]  Controlled Substance Reporting system. A 12 month History has been reviewed on the [place]  Controlled Substance Reporting System  On 08/23/2024. 3. Depression: with Physical Illness:  Continue current medication regimen with Cymbalta .08/23/2024 4. Insomnia: Continue current medication regimen with  Trazodone  one and half tablet at bedtime  11/06//2025 5. Muscle Spasms: Continue current medication regimen. 08/23/2024 6. Chronic Pain Syndrome: Continue Ibuprofen  BID as needed. 08/23/2024 7.  Migraine without Aura: Continue Topamax : Continue to Monitor. 08/23/2024 8. Dysphonia: She reports she was seen by ENT. S PCP Following. Continue to monitor.  11/062025 9. Trigeminal Neuralgia: Ms. Sallye Racer reports her dentist Dr Raynaldo diagnose her with Trigeminal Neuralgia. Neurology Following.  11/062025   F/U in 1 month

## 2024-08-24 ENCOUNTER — Other Ambulatory Visit

## 2024-08-24 ENCOUNTER — Encounter: Payer: Self-pay | Admitting: Cardiology

## 2024-08-24 DIAGNOSIS — I10 Essential (primary) hypertension: Secondary | ICD-10-CM

## 2024-08-25 ENCOUNTER — Ambulatory Visit: Payer: Self-pay

## 2024-08-25 LAB — LIPID PANEL
Cholesterol: 159 mg/dL (ref <200)
HDL: 49 mg/dL — ABNORMAL LOW (ref >=50)
LDL Cholesterol (Calc): 75 mg/dL
Non-HDL Cholesterol (Calc): 110 mg/dL (ref <130)
Total CHOL/HDL Ratio: 3.2 (calc) (ref <5.0)
Triglycerides: 266 mg/dL — ABNORMAL HIGH (ref <150)

## 2024-08-27 LAB — DRUG TOX MONITOR 1 W/CONF, ORAL FLD
Amphetamines: NEGATIVE ng/mL (ref <10)
Barbiturates: NEGATIVE ng/mL (ref <10)
Benzodiazepines: NEGATIVE ng/mL (ref <0.50)
Buprenorphine: NEGATIVE ng/mL (ref <0.10)
Cocaine: NEGATIVE ng/mL (ref <5.0)
Codeine: NEGATIVE ng/mL (ref <2.5)
Dihydrocodeine: NEGATIVE ng/mL (ref <2.5)
Fentanyl: NEGATIVE ng/mL (ref <0.10)
Heroin Metabolite: NEGATIVE ng/mL (ref <1.0)
Hydrocodone: NEGATIVE ng/mL (ref <2.5)
Hydromorphone: NEGATIVE ng/mL (ref <2.5)
MARIJUANA: NEGATIVE ng/mL (ref <2.5)
MDMA: NEGATIVE ng/mL (ref <10)
Meprobamate: NEGATIVE ng/mL (ref <2.5)
Methadone: NEGATIVE ng/mL (ref <5.0)
Morphine: NEGATIVE ng/mL (ref <2.5)
Nicotine Metabolite: NEGATIVE ng/mL (ref <5.0)
Norhydrocodone: NEGATIVE ng/mL (ref <2.5)
Noroxycodone: 22.3 ng/mL — ABNORMAL HIGH (ref <2.5)
Opiates: POSITIVE ng/mL — AB (ref <2.5)
Oxycodone: 137.8 ng/mL — ABNORMAL HIGH (ref <2.5)
Oxymorphone: NEGATIVE ng/mL (ref <2.5)
Phencyclidine: NEGATIVE ng/mL (ref <10)
Tapentadol: NEGATIVE ng/mL (ref <5.0)
Tramadol: NEGATIVE ng/mL (ref <5.0)
Zolpidem: NEGATIVE ng/mL (ref <5.0)

## 2024-08-27 LAB — DRUG TOX ALC METAB W/CON, ORAL FLD: Alcohol Metabolite: NEGATIVE ng/mL (ref <25)

## 2024-08-28 ENCOUNTER — Ambulatory Visit
Admission: EM | Admit: 2024-08-28 | Discharge: 2024-08-28 | Disposition: A | Discharge status: Home / Self Care | Attending: Emergency Medicine | Admitting: Emergency Medicine

## 2024-08-28 ENCOUNTER — Ambulatory Visit: Payer: Self-pay

## 2024-08-28 DIAGNOSIS — R300 Dysuria: Secondary | ICD-10-CM | POA: Insufficient documentation

## 2024-08-28 LAB — POCT URINE DIPSTICK
Glucose, UA: >=1000 mg/dL — AB
Leukocytes, UA: NEGATIVE
Nitrite, UA: POSITIVE — AB
POC PROTEIN,UA: 100 — AB
Spec Grav, UA: 1.02 (ref 1.010–1.025)
Urobilinogen, UA: 1 U/dL
pH, UA: 6 (ref 5.0–8.0)

## 2024-08-28 MED ORDER — CEPHALEXIN 500 MG PO CAPS: 500.0000 mg | ORAL_CAPSULE | Freq: Three times a day (TID) | ORAL | 0 refills | Status: AC

## 2024-08-28 NOTE — ED Provider Notes (Signed)
 [name]    CSN: [id number] Arrival date & time: 08/28/24  1151      History   Chief Complaint Chief Complaint  Patient presents with   Urinary Frequency   Dysuria   Back Pain    HPI [name] is a 58 y.o. female.  Patient presents with 1 week history of dysuria, urinary frequency, bladder pressure, hematuria, flank pain.  No fever.  She took Azo last night.  No OTC medication taken today.  The history is provided by the patient and medical records.    Past Medical History:  Diagnosis Date   Allergy    Anxiety    Arthritis    just the norm (01/24/2013)   ASCVD (arteriosclerotic cardiovascular disease)    Asthma    Celiac disease    Daily headache    depends on the season (01/24/2013)   Diabetes mellitus without complication (HCC)    Fibromyalgia    GERD (gastroesophageal reflux disease)    History of colonic polyps    Hyperlipidemia    Hypertension    Migraines    Obesity (BMI 30.0-34.9)    RSD (reflex sympathetic dystrophy)    Sleep apnea    NO MACHINE RECOMMENDED   TIA (transient ischemic attack) ~ 2009    Patient Active Problem List   Diagnosis Date Noted   COVID-19 virus infection 05/22/2024   Rash 04/06/2024   Type 2 diabetes mellitus with neurological complications (HCC) 02/27/2024   Chest heaviness 09/26/2023   Vaginal itching 09/26/2023   Atypical facial pain 07/28/2023   Diabetes mellitus treated with oral medication (HCC) 06/27/2023   Oral thrush 06/23/2023   Vertigo 12/27/2022   Acute non-recurrent pansinusitis 11/04/2022   Herpes zoster without complication 12/25/2021   Allergic reaction caused by a drug 11/11/2021   Type 2 diabetes mellitus with circulatory disorder, without long-term current use of insulin (HCC) 07/22/2021   MCI (mild cognitive impairment) 06/19/2021   Chronic narcotic dependence (HCC) 05/22/2019   Celiac disease    Advance directive discussed with patient 05/02/2017   RSD upper limb  01/27/2015   Reflex sympathetic dystrophy of lower extremity 09/26/2014   Swelling of limb 04/18/2014   Peripheral autonomic neuropathy 08/03/2013   Urinary incontinence 04/19/2013   Routine general medical examination at a health care facility 08/02/2011   Migraine variant 12/21/2007   Essential hypertension, benign 06/30/2007   Non-seasonal allergic rhinitis due to pollen 06/30/2007   Mood disorder 03/24/2007   RSD lower limb 03/24/2007   Mild intermittent asthma in adult without complication 03/24/2007   GERD 03/24/2007   Sleep apnea 03/24/2007   History of colonic polyps 03/24/2007    Past Surgical History:  Procedure Laterality Date   APPENDECTOMY  ~ 06/2007   BLADDER REPAIR  ~ 06/2007   same day after bladder lift (01/24/2013)   BLADDER SUSPENSION  ~ 06/2007   BREAST BIOPSY Left    DIAGNOSTIC LAPAROSCOPY  1990's & ~ 2000   I've had a couple; for endometrosis (01/24/2013)   HERNIA REPAIR     INCISIONAL HERNIA REPAIR N/A 01/24/2013   Procedure: LAPAROSCOPIC INCISIONAL HERNIA;  Surgeon: Vicenta DELENA Poli, MD;  Location: [facility] OR;  Service: General;  Laterality: N/A;   INSERTION OF MESH N/A 01/24/2013   Procedure: INSERTION OF MESH;  Surgeon: Vicenta DELENA Poli, MD;  Location: [facility] OR;  Service: General;  Laterality: N/A;   LAPAROSCOPIC INCISIONAL / UMBILICAL / VENTRAL HERNIA REPAIR  01/24/2013   IHR w/mesh/notes  NASAL SEPTUM SURGERY  1980's?   OOPHORECTOMY Right 2009   TONSILLECTOMY  1990's   VAGINAL HYSTERECTOMY  ` 06/2007    OB History     Gravida  2   Para  1   Term      Preterm  1   AB  1   Living  2      SAB  0   IAB      Ectopic      Multiple  1   Live Births               Home Medications    Prior to Admission medications   Medication Sig Start Date End Date Taking? Authorizing Provider  cephALEXin (KEFLEX) 500 MG capsule Take 1 capsule (500 mg total) by mouth 3 (three) times daily for 5 days. 08/28/24 09/02/24 Yes Corlis Burnard DEL, NP  albuterol   (VENTOLIN  HFA) 108 (90 Base) MCG/ACT inhaler Inhale 2 puffs into the lungs every 6 (six) hours as needed for wheezing or shortness of breath. 05/22/24   Jimmy Charlie FERNS, MD  aspirin  EC 81 MG tablet Take 1 tablet (81 mg total) by mouth daily. Swallow whole. 01/17/24   Penumalli, Vikram R, MD  budesonide -formoterol  (SYMBICORT ) 160-4.5 MCG/ACT inhaler Inhale 2 puffs into the lungs daily. 07/04/24   Webb, Padonda B, FNP  clobetasol  ointment (TEMOVATE ) 0.05 % Apply 1 Application topically 2 (two) times daily. 04/11/24   [provider]  cyanocobalamin  (VITAMIN B12) 1000 MCG/ML injection Inject 1,000 mcg into the muscle every 30 (thirty) days.    [provider]  CYMBALTA  30 MG capsule TAKE 3 CAPSULES BY MOUTH EVERY DAY 05/14/24   Debby Fidela CROME, NP  DULoxetine  (CYMBALTA ) 60 MG capsule TAKE 1 CAPSULE BY MOUTH EVERY DAY 04/10/24   Debby Fidela CROME, NP  fluconazole  (DIFLUCAN ) 150 MG tablet TAKE 1 TABLET (150 MG TOTAL) BY MOUTH TWO TIMES A WEEK 05/23/24   Jimmy Charlie FERNS, MD  fluticasone  (FLONASE ) 50 MCG/ACT nasal spray SPRAY 2 SPRAYS INTO EACH NOSTRIL EVERY DAY 03/26/24   Soldatova, Liuba, MD  ibuprofen  (ADVIL ) 600 MG tablet TAKE 1 TABLET (600 MG TOTAL) BY MOUTH EVERY 8 HOURS AS NEEDED FOR MILD PAIN (PAIN SCORE 1-3) 06/21/24   Debby Fidela CROME, NP  JARDIANCE  10 MG TABS tablet TAKE 1 TABLET BY MOUTH DAILY BEFORE BREAKFAST. 06/04/24   Jimmy Charlie FERNS, MD  loratadine (CLARITIN) 10 MG tablet Take 10 mg by mouth daily.    [provider]  metFORMIN  (GLUCOPHAGE -XR) 500 MG 24 hr tablet Take 2 tablets (1,000 mg total) by mouth daily with breakfast. 07/13/24   Webb, Padonda B, FNP  montelukast  (SINGULAIR ) 10 MG tablet Take 1 tablet (10 mg total) by mouth at bedtime. 07/05/24   Webb, Padonda B, FNP  naloxone  (NARCAN ) nasal spray 4 mg/0.1 mL Use as directed if lethargic or respiratory depression after opioid use 06/12/24   Thomas, Eunice L, NP  olmesartan (BENICAR) 40 MG tablet Take 1 tablet (40 mg  total) by mouth daily. 07/23/24   Webb, Padonda B, FNP  omeprazole (PRILOSEC) 40 MG capsule Take 40 mg by mouth 2 (two) times daily. 07/27/21   [provider]  ondansetron  (ZOFRAN -ODT) 4 MG disintegrating tablet Take 4 mg by mouth daily as needed. 04/03/24   [provider]  oxyCODONE -acetaminophen  (PERCOCET) 10-325 MG tablet Take 1 tablet by mouth 4 (four) times daily as needed for pain. 08/23/24   Debby Fidela CROME, NP  rosuvastatin  (CRESTOR ) 20 MG  tablet Take 1 tablet (20 mg total) by mouth daily. 06/08/24   Jimmy Charlie FERNS, MD  tiZANidine  (ZANAFLEX ) 4 MG tablet Take 1 tablet (4 mg total) by mouth every 6 (six) hours as needed for muscle spasms. 07/10/24   Debby Fidela CROME, NP  topiramate  (TOPAMAX ) 100 MG tablet TAKE 1 TABLET BY MOUTH EVERY MORNING AND TAKE 2 TABLETS BY MOUTH EVERY EVENING 02/22/24   Debby Fidela CROME, NP  traZODone  (DESYREL ) 50 MG tablet TAKE 1 AND 1/2 TABLETS BY MOUTH AT BEDTIME 07/05/24   Webb, Padonda B, FNP  losartan  (COZAAR ) 100 MG tablet TAKE 1 TABLET BY MOUTH EVERY DAY 04/26/19 04/28/19  Jimmy Charlie FERNS, MD    Family History Family History  Problem Relation Age of Onset   Hypertension Mother    Lung cancer Mother    Colon cancer Father    Hypertension Sister    Cancer Sister    Hyperlipidemia Sister    Breast cancer Sister 84   Breast cancer Paternal Aunt    Birth defects Maternal Grandmother    Breast cancer Maternal Grandmother    Ovarian cancer Maternal Grandmother    Birth defects Paternal Grandmother        uterine, stomach, lung   Breast cancer Paternal Grandmother    Heart attack Paternal Grandfather    Heart failure Paternal Grandfather    Cancer Brother     Social History Social History   Tobacco Use   Smoking status: Never    Passive exposure: Current   Smokeless tobacco: Never  Vaping Use   Vaping status: Never Used  Substance Use Topics   Alcohol  use: Not Currently    Comment: 01/24/2013 drink or 2 once or /twice/year, if  that   Drug use: No     Allergies   Baclofen , Meperidine , Carbamazepine , Cymbalta  [duloxetine  hcl], Diazepam, Other, and Shellfish allergy   Review of Systems Review of Systems  Constitutional:  Negative for chills and fever.  Gastrointestinal:  Negative for abdominal pain.  Genitourinary:  Positive for dysuria, flank pain, frequency and hematuria.     Physical Exam Triage Vital Signs ED Triage Vitals  Encounter Vitals Group     BP 08/28/24 1316 135/80     Girls Systolic BP Percentile --      Girls Diastolic BP Percentile --      Boys Systolic BP Percentile --      Boys Diastolic BP Percentile --      Pulse Rate 08/28/24 1316 71     Resp 08/28/24 1316 18     Temp 08/28/24 1316 98.1 F (36.7 C)     Temp src --      SpO2 08/28/24 1316 95 %     Weight --      Height --      Head Circumference --      Peak Flow --      Pain Score 08/28/24 1317 9     Pain Loc --      Pain Education --      Exclude from Growth Chart --    No data found.  Updated Vital Signs BP 135/80   Pulse 71   Temp 98.1 F (36.7 C)   Resp 18   SpO2 95%   Visual Acuity Right Eye Distance:   Left Eye Distance:   Bilateral Distance:    Right Eye Near:   Left Eye Near:    Bilateral Near:     Physical Exam Constitutional:  General: She is not in acute distress. HENT:     Mouth/Throat:     Mouth: Mucous membranes are moist.  Cardiovascular:     Rate and Rhythm: Normal rate.  Pulmonary:     Effort: Pulmonary effort is normal. No respiratory distress.  Abdominal:     General: Bowel sounds are normal.     Palpations: Abdomen is soft.     Tenderness: There is no abdominal tenderness. There is no right CVA tenderness, left CVA tenderness, guarding or rebound.  Neurological:     Mental Status: She is alert.      UC Treatments / Results  Labs (all labs ordered are listed, but only abnormal results are displayed) Labs Reviewed  POCT URINE DIPSTICK - Abnormal; Notable for the  following components:      Result Value   Color, UA orange (*)    Clarity, UA cloudy (*)    Glucose, UA >=1,000 (*)    Bilirubin, UA small (*)    Ketones, POC UA trace (5) (*)    Blood, UA moderate (*)    POC PROTEIN,UA =100 (*)    Nitrite, UA Positive (*)    All other components within normal limits  URINE CULTURE    EKG   Radiology No results found.  Procedures Procedures (including critical care time)  Medications Ordered in UC Medications - No data to display  Initial Impression / Assessment and Plan / UC Course  I have reviewed the triage vital signs and the nursing notes.  Pertinent labs & imaging results that were available during my care of the patient were reviewed by me and considered in my medical decision making (see chart for details).    Dysuria.  Afebrile and vital signs are stable.  Patient has glucose in her urine today but is on Jardiance .  Last A1c 6.7 on 05/31/2024.  She took Azo last night.  Treating with Keflex. Urine culture pending. Discussed with patient that we will call her if the urine culture shows the need to change or discontinue the antibiotic. Instructed her to follow-up with her PCP.  ED precautions given. Patient agrees to plan of care.     Final Clinical Impressions(s) / UC Diagnoses   Final diagnoses:  Dysuria     Discharge Instructions      Take the antibiotic as directed.  The urine culture is pending.  We will call you if it shows the need to change or discontinue your antibiotic.    Follow up with your primary care provider.  Go to the emergency department if you have worsening symptoms.        ED Prescriptions     Medication Sig Dispense Auth. Provider   cephALEXin (KEFLEX) 500 MG capsule Take 1 capsule (500 mg total) by mouth 3 (three) times daily for 5 days. 15 capsule Corlis Burnard DEL, NP      PDMP not reviewed this encounter.   Corlis Burnard DEL, NP 08/28/24 1346

## 2024-08-28 NOTE — Telephone Encounter (Signed)
 FYI Only or Action Required?: FYI only for provider: Going to UC for symptoms.  Patient was last seen in primary care on 05/31/2024 by Jimmy Charlie FERNS, MD.  Called Nurse Triage reporting Urinary Frequency.  Symptoms began a week ago.  Interventions attempted: Nothing.  Symptoms are: gradually worsening.  Triage Disposition: See HCP Within 4 Hours (Or PCP Triage)  Patient/caregiver understands and will follow disposition?: Yes        Copied from CRM #[id number]. Topic: Clinical - Red Word Triage >> Aug 28, 2024 11:00 AM Robinson H wrote: [facility] that prompted transfer to Nurse Triage: Possible UTI pain before and after urination and frequent urination Reason for Disposition  [1] SEVERE pain with urination (e.g., excruciating) AND [2] not improved after 2 hours of pain medicine  Answer Assessment - Initial Assessment Questions 1. SEVERITY: How bad is the pain?  (e.g., Scale 1-10; mild, moderate, or severe)     Has a lot of pressure  2. PATTERN: Is pain present every time you urinate or just sometimes?      Every time  3. ONSET: When did the painful urination start?      1 week ago  4. FEVER: Do you have a fever? If Yes, ask: What is your temperature, how was it measured, and when did it start?     Unsure  5. PAST UTI: Have you had a urine infection before? If Yes, ask: When was the last time? and What happened that time?      Yes  6. CAUSE: What do you think is causing the painful urination?  (e.g., UTI, scratch, Herpes sore)     UTI  7. OTHER SYMPTOMS: Do you have any other symptoms? (e.g., blood in urine, flank pain, genital sores, urgency, vaginal discharge)     Blood in urine  Pt advised to go to UC today for symptoms due to no availability in office. This RN called [facility] to verify.  Pt given information for UC and states she will walk in.  Protocols used: Urination Pain - Female-A-AH

## 2024-08-28 NOTE — Discharge Instructions (Addendum)
 Take the antibiotic as directed.  The urine culture is pending.  We will call you if it shows the need to change or discontinue your antibiotic.    Follow up with your primary care provider.  Go to the emergency department if you have worsening symptoms.

## 2024-08-28 NOTE — ED Triage Notes (Signed)
 Patient to Urgent Care with complaints of  hematuria/ urinary frequency/ suprapubic pressure/ flank pain. Unknown fevers.   Taking azo.  Symptoms x 1 week.

## 2024-08-31 ENCOUNTER — Encounter

## 2024-08-31 ENCOUNTER — Ambulatory Visit (HOSPITAL_COMMUNITY): Payer: Self-pay

## 2024-08-31 ENCOUNTER — Ambulatory Visit

## 2024-08-31 VITALS — BP 124/82 | HR 100 | Temp 99.3°F | Ht 67.0 in | Wt 164.0 lb

## 2024-08-31 DIAGNOSIS — Z23 Encounter for immunization: Secondary | ICD-10-CM

## 2024-08-31 DIAGNOSIS — E538 Deficiency of other specified B group vitamins: Secondary | ICD-10-CM | POA: Diagnosis not present

## 2024-08-31 DIAGNOSIS — G6289 Other specified polyneuropathies: Secondary | ICD-10-CM

## 2024-08-31 DIAGNOSIS — E1159 Type 2 diabetes mellitus with other circulatory complications: Secondary | ICD-10-CM

## 2024-08-31 DIAGNOSIS — K2270 Barrett's esophagus without dysplasia: Secondary | ICD-10-CM

## 2024-08-31 DIAGNOSIS — Z7984 Long term (current) use of oral hypoglycemic drugs: Secondary | ICD-10-CM

## 2024-08-31 DIAGNOSIS — F419 Anxiety disorder, unspecified: Secondary | ICD-10-CM | POA: Insufficient documentation

## 2024-08-31 DIAGNOSIS — F339 Major depressive disorder, recurrent, unspecified: Secondary | ICD-10-CM | POA: Insufficient documentation

## 2024-08-31 DIAGNOSIS — E785 Hyperlipidemia, unspecified: Secondary | ICD-10-CM

## 2024-08-31 DIAGNOSIS — I10 Essential (primary) hypertension: Secondary | ICD-10-CM

## 2024-08-31 LAB — URINE CULTURE: Culture: >=100000 — AB

## 2024-08-31 LAB — POCT GLYCOSYLATED HEMOGLOBIN (HGB A1C): Hemoglobin A1C: 7 % — AB (ref 4.0–5.6)

## 2024-08-31 LAB — HM DIABETES FOOT EXAM

## 2024-08-31 NOTE — Patient Instructions (Signed)
 Thank you for visiting [facility] today! Here's what we talked about: - Continue diabetic meds, cut back on sugary drinks - Set up diabetic eye appt

## 2024-08-31 NOTE — Progress Notes (Signed)
 Subjective:   This visit was conducted in person. The patient gave informed consent to the use of Abridge AI technology to record the contents of the encounter as documented below.   Patient ID: [name], female    DOB: 11/18/1965, 58 y.o.   MRN: [id number]   Discussed the use of AI scribe software for clinical note transcription with the patient, who gave verbal consent to proceed.  History of Present Illness [name] is a 58 year old female who presents for a comprehensive review of her medications and management of her chronic conditions.  She has a history of trigeminal neuralgia, complex regional pain syndrome (CRPS), fibromyalgia, and migraines. Her pain conditions are managed by physical medicine and rehabilitation. She takes Percocet for pain management, Topamax  for migraines, and tizanidine  as a muscle relaxant. She experiences significant pain in her feet, describing it as 'walking on fire rocks,' along with numbness and tingling in her calves and feet, which she attributes to either CRPS or neuropathy.  She has type 2 diabetes, managed with metformin  1000 mg for breakfast and Jardiance . Her last A1c was 7.0. She acknowledges increased consumption of sugary sodas recently, which may have contributed to this rise. She also experiences intermittent vaginal itching, which she associates with her diabetes medication.  She has a history of asthma, controlled with Symbicort  and albuterol  as needed. She also has sleep apnea but does not use a CPAP machine as it was deemed unnecessary. She reports poor sleep quality.  She has acid reflux and Barrett's esophagus, managed with omeprazole 40 mg twice daily. An EGD earlier this year was performed for Barrett's esophagus. She also had oral thrush related to inhaler use, which has resolved.  She has high blood pressure, managed with Benicar 40 mg daily, and high cholesterol, managed with Crestor  20 mg daily. Recent  cholesterol levels were checked.  She has environmental allergies and some medication allergies, managed with Claritin 10 mg daily. She also has vitamin B12 deficiency, receiving monthly injections at the clinic, as oral supplements were ineffective. She takes Cymbalta  90 mg daily for mood management, including anxiety and depression related to her chronic pain conditions.  She has recurrent urinary tract infections, with a recent episode treated with Keflex. She also has Raynaud's phenomenon, with her feet turning blue or purple in cold conditions.  She has had COVID-19 infection three times, with the most recent occurrence in the summer. She also mentions a past reaction to carbamazepine , which caused a rash that has since resolved.    Review of Systems  All other systems reviewed and are negative.       Allergies  Allergen Reactions   Baclofen     Meperidine  Anaphylaxis, Itching and Swelling    REACTION: swelling   Carbamazepine  Other (See Comments)    Rash, body aches, tongue spasms   Cymbalta  [Duloxetine  Hcl] Hives    Generic makes her feel like she is crawling out of her skin   Diazepam Hives    REACTION: swelling   Other     Axe Cologne   Shellfish Allergy Hives    Current Outpatient Medications on File Prior to Visit  Medication Sig Dispense Refill   albuterol  (VENTOLIN  HFA) 108 (90 Base) MCG/ACT inhaler Inhale 2 puffs into the lungs every 6 (six) hours as needed for wheezing or shortness of breath. 18 g 1   aspirin  EC 81 MG tablet Take 1 tablet (81 mg total) by mouth daily. Swallow whole.  budesonide -formoterol  (SYMBICORT ) 160-4.5 MCG/ACT inhaler Inhale 2 puffs into the lungs daily. 1 each 12   cephALEXin (KEFLEX) 500 MG capsule Take 1 capsule (500 mg total) by mouth 3 (three) times daily for 5 days. 15 capsule 0   clobetasol  ointment (TEMOVATE ) 0.05 % Apply 1 Application topically 2 (two) times daily.     cyanocobalamin  (VITAMIN B12) 1000 MCG/ML injection Inject  1,000 mcg into the muscle every 30 (thirty) days.     CYMBALTA  30 MG capsule TAKE 3 CAPSULES BY MOUTH EVERY DAY 270 capsule 2   DULoxetine  (CYMBALTA ) 60 MG capsule TAKE 1 CAPSULE BY MOUTH EVERY DAY 90 capsule 3   fluconazole  (DIFLUCAN ) 150 MG tablet TAKE 1 TABLET (150 MG TOTAL) BY MOUTH TWO TIMES A WEEK 8 tablet 3   fluticasone  (FLONASE ) 50 MCG/ACT nasal spray SPRAY 2 SPRAYS INTO EACH NOSTRIL EVERY DAY 48 mL 2   ibuprofen  (ADVIL ) 600 MG tablet TAKE 1 TABLET (600 MG TOTAL) BY MOUTH EVERY 8 HOURS AS NEEDED FOR MILD PAIN (PAIN SCORE 1-3) 90 tablet 5   JARDIANCE  10 MG TABS tablet TAKE 1 TABLET BY MOUTH DAILY BEFORE BREAKFAST. 30 tablet 11   loratadine (CLARITIN) 10 MG tablet Take 10 mg by mouth daily.     metFORMIN  (GLUCOPHAGE -XR) 500 MG 24 hr tablet Take 2 tablets (1,000 mg total) by mouth daily with breakfast. 180 tablet 0   montelukast  (SINGULAIR ) 10 MG tablet Take 1 tablet (10 mg total) by mouth at bedtime. 90 tablet 1   naloxone  (NARCAN ) nasal spray 4 mg/0.1 mL Use as directed if lethargic or respiratory depression after opioid use 2 each 0   olmesartan (BENICAR) 40 MG tablet Take 1 tablet (40 mg total) by mouth daily. 90 tablet 0   omeprazole (PRILOSEC) 40 MG capsule Take 40 mg by mouth 2 (two) times daily.     ondansetron  (ZOFRAN -ODT) 4 MG disintegrating tablet Take 4 mg by mouth daily as needed.     oxyCODONE -acetaminophen  (PERCOCET) 10-325 MG tablet Take 1 tablet by mouth 4 (four) times daily as needed for pain. 120 tablet 0   rosuvastatin  (CRESTOR ) 20 MG tablet Take 1 tablet (20 mg total) by mouth daily. 90 tablet 0   tiZANidine  (ZANAFLEX ) 4 MG tablet Take 1 tablet (4 mg total) by mouth every 6 (six) hours as needed for muscle spasms. 120 tablet 3   topiramate  (TOPAMAX ) 100 MG tablet TAKE 1 TABLET BY MOUTH EVERY MORNING AND TAKE 2 TABLETS BY MOUTH EVERY EVENING 270 tablet 1   traZODone  (DESYREL ) 50 MG tablet TAKE 1 AND 1/2 TABLETS BY MOUTH AT BEDTIME 135 tablet 1   [DISCONTINUED] losartan   (COZAAR ) 100 MG tablet TAKE 1 TABLET BY MOUTH EVERY DAY 90 tablet 0   No current facility-administered medications on file prior to visit.    BP 124/82 (BP Location: Right Arm, Patient Position: Sitting, Cuff Size: Normal)   Pulse 100   Temp 99.3 F (37.4 C) (Oral)   Ht 5' 7 (1.702 m)   Wt 164 lb (74.4 kg)   SpO2 97%   BMI 25.69 kg/m   Objective:      Physical Exam GENERAL: Alert, cooperative, well developed, no acute distress. HEAD: Normocephalic atraumatic. EXTREMITIES: Peripheral blood flow intact, significant neuropathy in feet, feet not discolored, no cyanosis or edema. NEUROLOGICAL: Oriented to person, place and time, no gait abnormalities, moves all extremities without gross motor or sensory deficit.  Physical Exam Vitals and nursing note reviewed.  Constitutional:      Appearance:  Normal appearance.  Cardiovascular:     Pulses:          Dorsalis pedis pulses are 2+ on the right side and 2+ on the left side.       Posterior tibial pulses are 2+ on the right side and 2+ on the left side.  Feet:     Right foot:     Protective Sensation: 10 sites tested.  3 sites sensed.     Skin integrity: Skin integrity normal.     Toenail Condition: Right toenails are normal.     Left foot:     Protective Sensation: 10 sites tested.  5 sites sensed.     Skin integrity: Skin integrity normal.     Toenail Condition: Left toenails are normal.  Neurological:     Mental Status: She is alert.         Assessment & Plan:   Assessment & Plan Type 2 diabetes mellitus with diabetic polyneuropathy Type 2 diabetes with A1c of 7.0, slightly above target, will control with diet and lifestyle at this time.  If unimproved or worsened at next diabetic visit, would augment medications. Notable peripheral neuropathy with numbness and pain in feet, possibly due to diabetes vs vitamin B12 or B6 deficiency.  Of note, patient currently on B12 injections, level several months ago was low at 153.   Will repeat now, if improved and B6 comes back normal, would consider heavy metal toxicity and EMG to check demyelinating/axonal disease.  Current A1c: 7.0 Last A1c: 6.7 Urine microalbumin: Done Eye exam: Needs Foot exam: Done 35 Ace-i/ARB: Yes Statin: Yes  Influenza and Pneumococcal vaccines: Given Neuropathy: Yes Nephropathy: No Retinopathy: No  - Continue Jardiance  10 mg daily. - Continue metformin  1000 mg daily. - Recheck A1c in 3 months. - Order vitamin B12 and B6 levels. - Encourage reduction in soda consumption. - Schedule diabetic maintenance visit in 3 months.   Barrett's esophagus and gastroesophageal reflux disease Barrett's esophagus well-managed. GERD symptoms managed with omeprazole. - Continue omeprazole 40 mg twice daily.  Hypertension Blood pressure well-controlled on current regimen. - Continue Benicar 40 mg daily.  Hyperlipidemia Cholesterol levels improved with current treatment. - Continue Crestor  20 mg daily.  Fibromyalgia and complex regional pain syndrome, left arm CRPS in left arm part of worker's compensation case.  Currently following PM and R, who also manage her medications.       Return in about 3 weeks (around 09/21/2024) for CPE, fasting labs one week prior. then 3 mo for diabetic visit .   Rania Prothero K Hawk Mones, MD  08/31/24     Contains text generated by Abridge.

## 2024-09-01 LAB — MICROALBUMIN / CREATININE URINE RATIO
Creatinine, Urine: 81 mg/dL (ref 20–275)
Microalb Creat Ratio: 57 mg/g{creat} — ABNORMAL HIGH (ref <30)
Microalb, Ur: 4.6 mg/dL

## 2024-09-03 ENCOUNTER — Encounter: Payer: Self-pay | Admitting: Diagnostic Neuroimaging

## 2024-09-03 ENCOUNTER — Ambulatory Visit: Payer: Self-pay

## 2024-09-03 ENCOUNTER — Ambulatory Visit: Admitting: Diagnostic Neuroimaging

## 2024-09-03 VITALS — BP 126/84 | HR 89 | Ht 67.0 in | Wt 162.0 lb

## 2024-09-03 DIAGNOSIS — R519 Headache, unspecified: Secondary | ICD-10-CM | POA: Diagnosis not present

## 2024-09-03 NOTE — Patient Instructions (Signed)
 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

## 2024-09-03 NOTE — Progress Notes (Signed)
 [facility]  PATIENT: [name] DOB: [birth year]  REFERRING CLINICIAN: Jimmy Charlie FERNS, MD HISTORY FROM: patient  REASON FOR VISIT: follow up   HISTORICAL  CHIEF COMPLAINT:  Chief Complaint  Patient presents with   RM 7    Left facial pain; also seeing [facility]; with husband    HISTORY OF PRESENT ILLNESS:   UPDATE (09/03/24, VRP): Since last visit, CBZ helped with pain, but then had reaction and had to stop. Then saw [facility] and planning for repeat MRI w/wo and possible gamma knife.   UPDATE (02/28/24, VRP): Since last visit, doing about the same. Symptoms are left facial pain and intermittent shock like sensations in left head. Indomethacin  did not help.    PRIOR HPI (12/16/23, VRP): 58 year old female here for evaluation of left sided headache and pain.  August 2024 patient had left mandibular crown replacement due to concern of underlying caries.  Patient did not have pain prior to this procedure.  Within a few days she started to develop pain in the left side of her head, face, eye, ear, throat.  Left tongue felt a burning sensation.  Initially symptoms were quite severe and consistent.  She had left eye watering and left nasal drainage as well.  Symptoms have been consistent since August and unremitting.  In the last few weeks symptoms have slightly improved.   REVIEW OF SYSTEMS: Full 14 system review of systems performed and negative with exception of: as per HPI.  ALLERGIES: Allergies  Allergen Reactions   Baclofen     Meperidine  Anaphylaxis, Itching and Swelling    REACTION: swelling   Carbamazepine  Other (See Comments)    Rash, body aches, tongue spasms   Cymbalta  [Duloxetine  Hcl] Hives    Generic makes her feel like she is crawling out of her skin   Diazepam Hives    REACTION: swelling   Other     Axe Cologne   Shellfish Allergy Hives    HOME MEDICATIONS: Outpatient Medications Prior to  Visit  Medication Sig Dispense Refill   albuterol  (VENTOLIN  HFA) 108 (90 Base) MCG/ACT inhaler Inhale 2 puffs into the lungs every 6 (six) hours as needed for wheezing or shortness of breath. 18 g 1   aspirin  EC 81 MG tablet Take 1 tablet (81 mg total) by mouth daily. Swallow whole.     budesonide -formoterol  (SYMBICORT ) 160-4.5 MCG/ACT inhaler Inhale 2 puffs into the lungs daily. 1 each 12   clobetasol  ointment (TEMOVATE ) 0.05 % Apply 1 Application topically 2 (two) times daily.     cyanocobalamin  (VITAMIN B12) 1000 MCG/ML injection Inject 1,000 mcg into the muscle every 30 (thirty) days.     DULoxetine  (CYMBALTA ) 60 MG capsule TAKE 1 CAPSULE BY MOUTH EVERY DAY 90 capsule 3   fluticasone  (FLONASE ) 50 MCG/ACT nasal spray SPRAY 2 SPRAYS INTO EACH NOSTRIL EVERY DAY 48 mL 2   ibuprofen  (ADVIL ) 600 MG tablet TAKE 1 TABLET (600 MG TOTAL) BY MOUTH EVERY 8 HOURS AS NEEDED FOR MILD PAIN (PAIN SCORE 1-3) 90 tablet 5   JARDIANCE  10 MG TABS tablet TAKE 1 TABLET BY MOUTH DAILY BEFORE BREAKFAST. 30 tablet 11   loratadine (CLARITIN) 10 MG tablet Take 10 mg by mouth daily.     metFORMIN  (GLUCOPHAGE -XR) 500 MG 24 hr tablet Take 2 tablets (1,000 mg total) by mouth daily with breakfast. 180 tablet 0   montelukast  (SINGULAIR ) 10 MG tablet Take 1 tablet (10 mg total) by mouth at bedtime. 90 tablet 1  naloxone  (NARCAN ) nasal spray 4 mg/0.1 mL Use as directed if lethargic or respiratory depression after opioid use 2 each 0   olmesartan (BENICAR) 40 MG tablet Take 1 tablet (40 mg total) by mouth daily. 90 tablet 0   omeprazole (PRILOSEC) 40 MG capsule Take 40 mg by mouth 2 (two) times daily.     ondansetron  (ZOFRAN -ODT) 4 MG disintegrating tablet Take 4 mg by mouth daily as needed.     oxyCODONE -acetaminophen  (PERCOCET) 10-325 MG tablet Take 1 tablet by mouth 4 (four) times daily as needed for pain. 120 tablet 0   rosuvastatin  (CRESTOR ) 20 MG tablet Take 1 tablet (20 mg total) by mouth daily. 90 tablet 0   tiZANidine   (ZANAFLEX ) 4 MG tablet Take 1 tablet (4 mg total) by mouth every 6 (six) hours as needed for muscle spasms. 120 tablet 3   topiramate  (TOPAMAX ) 100 MG tablet TAKE 1 TABLET BY MOUTH EVERY MORNING AND TAKE 2 TABLETS BY MOUTH EVERY EVENING 270 tablet 1   traZODone  (DESYREL ) 50 MG tablet TAKE 1 AND 1/2 TABLETS BY MOUTH AT BEDTIME 135 tablet 1   CYMBALTA  30 MG capsule TAKE 3 CAPSULES BY MOUTH EVERY DAY 270 capsule 2   fluconazole  (DIFLUCAN ) 150 MG tablet TAKE 1 TABLET (150 MG TOTAL) BY MOUTH TWO TIMES A WEEK 8 tablet 3   No facility-administered medications prior to visit.    PAST MEDICAL HISTORY: Past Medical History:  Diagnosis Date   Allergy    Anxiety    Arthritis    just the norm (01/24/2013)   ASCVD (arteriosclerotic cardiovascular disease)    Asthma    Celiac disease    Daily headache    depends on the season (01/24/2013)   Diabetes mellitus without complication (HCC)    Fibromyalgia    GERD (gastroesophageal reflux disease)    History of colonic polyps    Hyperlipidemia    Hypertension    Migraines    Obesity (BMI 30.0-34.9)    RSD (reflex sympathetic dystrophy)    Sleep apnea    NO MACHINE RECOMMENDED   TIA (transient ischemic attack) ~ 2009    PAST SURGICAL HISTORY: Past Surgical History:  Procedure Laterality Date   APPENDECTOMY  ~ 06/2007   BLADDER REPAIR  ~ 06/2007   same day after bladder lift (01/24/2013)   BLADDER SUSPENSION  ~ 06/2007   BREAST BIOPSY Left    DIAGNOSTIC LAPAROSCOPY  1990's & ~ 2000   I've had a couple; for endometrosis (01/24/2013)   HERNIA REPAIR     INCISIONAL HERNIA REPAIR N/A 01/24/2013   Procedure: LAPAROSCOPIC INCISIONAL HERNIA;  Surgeon: Vicenta DELENA Poli, MD;  Location: [facility] OR;  Service: General;  Laterality: N/A;   INSERTION OF MESH N/A 01/24/2013   Procedure: INSERTION OF MESH;  Surgeon: Vicenta DELENA Poli, MD;  Location: [facility] OR;  Service: General;  Laterality: N/A;   LAPAROSCOPIC INCISIONAL / UMBILICAL / VENTRAL HERNIA REPAIR  01/24/2013    IHR w/mesh/notes   NASAL SEPTUM SURGERY  1980's?   OOPHORECTOMY Right 2009   TONSILLECTOMY  1990's   VAGINAL HYSTERECTOMY  ` 06/2007    FAMILY HISTORY: Family History  Problem Relation Age of Onset   Hypertension Mother    Lung cancer Mother    Colon cancer Father    Hypertension Sister    Cancer Sister    Hyperlipidemia Sister    Breast cancer Sister 72   Breast cancer Paternal Aunt    Birth defects Maternal Grandmother  Breast cancer Maternal Grandmother    Ovarian cancer Maternal Grandmother    Birth defects Paternal Grandmother        uterine, stomach, lung   Breast cancer Paternal Grandmother    Heart attack Paternal Grandfather    Heart failure Paternal Grandfather    Cancer Brother     SOCIAL HISTORY: Social History   Socioeconomic History   Marital status: Married    Spouse name: Not on file   Number of children: 2   Years of education: Not on file   Highest education level: Not on file  Occupational History   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  Tobacco Use   Smoking status: Never    Passive exposure: Current   Smokeless tobacco: Never  Vaping Use   Vaping status: Never Used  Substance and Sexual Activity   Alcohol  use: Not Currently    Comment: 01/24/2013 drink or 2 once or /twice/year, if that   Drug use: No   Sexual activity: Yes    Birth control/protection: None  Other Topics Concern   Not on file  Social History Narrative   No living will   Would want husband as POA--then sister, Barnie   Would accept resuscitation attempts   Probably would not want tube feeds if cognitively unaware   Pt lives with family    Pt doesn't work    Social Drivers of Corporate Investment Banker Strain: Not on Ship [employer] Insecurity: Not on file  Transportation Needs: Not on file  Physical Activity: Not on file  Stress: Not on file  Social Connections: Not on file  Intimate Partner Violence: Not on file     PHYSICAL EXAM  GENERAL  EXAM/CONSTITUTIONAL: Vitals:  Vitals:   09/03/24 1500  BP: 126/84  Pulse: 89  Weight: 162 lb (73.5 kg)  Height: 5' 7 (1.702 m)   Body mass index is 25.37 kg/m. Wt Readings from Last 3 Encounters:  09/03/24 162 lb (73.5 kg)  08/31/24 164 lb (74.4 kg)  08/23/24 164 lb (74.4 kg)   Patient is in no distress; well developed, nourished and groomed; neck is supple  CARDIOVASCULAR: Examination of carotid arteries is normal; no carotid bruits Regular rate and rhythm, no murmurs Examination of peripheral vascular system by observation and palpation is normal  EYES: Ophthalmoscopic exam of optic discs and posterior segments is normal; no papilledema or hemorrhages No results found.  MUSCULOSKELETAL: Gait, strength, tone, movements noted in Neurologic exam below  NEUROLOGIC: MENTAL STATUS:      No data to display         awake, alert, oriented to person, place and time recent and remote memory intact normal attention and concentration language fluent, comprehension intact, naming intact fund of knowledge appropriate  CRANIAL NERVE:  2nd - no papilledema on fundoscopic exam 2nd, 3rd, 4th, 6th - pupils equal and reactive to light, visual fields full to confrontation, extraocular muscles intact, no nystagmus 5th - facial sensation symmetric 7th - facial strength symmetric 8th - hearing intact 9th - palate elevates symmetrically, UVULA DEVIATED TO THE LEFT 11th - shoulder shrug symmetric 12th - tongue protrusion midline SLIGHTLY HOARSE VOICE  MOTOR:  normal bulk and tone, full strength in the BUE, BLE  SENSORY:  normal and symmetric to light touch, temperature, vibration  COORDINATION:  finger-nose-finger, fine finger movements normal  REFLEXES:  deep tendon reflexes TRACE and symmetric  GAIT/STATION:  narrow based gait     DIAGNOSTIC DATA (LABS, IMAGING, TESTING) - I  reviewed patient records, labs, notes, testing and imaging myself where available.  Lab  Results  Component Value Date   WBC 7.2 02/27/2024   HGB 13.1 02/27/2024   HCT 40.7 02/27/2024   MCV 86.5 02/27/2024   PLT 266.0 02/27/2024      Component Value Date/Time   NA 140 02/27/2024 1505   NA 144 12/05/2023 1453   K 4.3 02/27/2024 1505   CL 108 02/27/2024 1505   CO2 24 02/27/2024 1505   GLUCOSE 102 (H) 02/27/2024 1505   BUN 21 02/27/2024 1505   BUN 19 12/05/2023 1453   CREATININE 0.86 02/27/2024 1505   CREATININE 1.00 07/01/2017 1132   CALCIUM  9.3 02/27/2024 1505   PROT 6.6 02/27/2024 1505   ALBUMIN 4.6 02/27/2024 1505   AST 11 02/27/2024 1505   ALT 10 02/27/2024 1505   ALKPHOS 69 02/27/2024 1505   BILITOT 0.3 02/27/2024 1505   GFRNONAA >60 02/09/2021 1618   GFRAA >90 01/16/2013 1039   Lab Results  Component Value Date   CHOL 159 08/24/2024   HDL 49 (L) 08/24/2024   LDLCALC 75 08/24/2024   LDLDIRECT 116.0 06/27/2023   TRIG 266 (H) 08/24/2024   CHOLHDL 3.2 08/24/2024   Lab Results  Component Value Date   HGBA1C 7.0 (A) 08/31/2024   Lab Results  Component Value Date   VITAMINB12 153 (L) 02/27/2024   Lab Results  Component Value Date   TSH 1.00 02/27/2024    01/09/24 MRI brain - left inferior cerebellar infarct - mild chronic small vessel ischemic disease   12/16/23 CT maxillofacial - Normal maxillofacial CT.   01/23/24 CTA head / neck -No large vessel occlusion. - No CT evidence of acute intracranial abnormality. - Small remote infarct in the posteroinferior left cerebellum. - Multifocal intracranial arterial stenoses. Moderate stenosis of the right paraclinoid ICA. Moderate to severe stenosis of a distal M2/M3 branch of the right MCA. Multifocal severe stenosis of M2 superior division branches of the left MCA. - Small left mastoid effusion.  02/17/24 TTE  1. Left ventricular ejection fraction, by estimation, is 60 to 65%. Left  ventricular ejection fraction by 3D volume is 58 %. The left ventricle has  normal function. The left ventricle has  no regional wall motion  abnormalities. Left ventricular diastolic   parameters are consistent with Grade I diastolic dysfunction (impaired  relaxation).   2. Right ventricular systolic function is normal. The right ventricular  size is normal.   3. Agitated saline contrast bubble study was negative, with no evidence  of any interatrial shunt.   4. The mitral valve is normal in structure. Trivial mitral valve  regurgitation. No evidence of mitral stenosis.   5. The aortic valve is normal in structure. Aortic valve regurgitation is  not visualized. No aortic stenosis is present.   6. Aortic dilatation noted. There is mild dilatation of the ascending  aorta, measuring 42 mm.   7. The inferior vena cava is normal in size with greater than 50%  respiratory variability, suggesting right atrial pressure of 3 mmHg.   02/24/24 cardiac monitor Average heart rate 83, minimum heart rate 54, max heart rate 141. No atrial fibrillation or atrial flutter. No sustained arrhythmias. Patient triggered events associated with sinus rhythm and rare PVCs.   ASSESSMENT AND PLAN  58 y.o. year old female here with new onset constant left-sided headache, pain, with automatic features including lacrimation and nasal drainage, since August 2024.  Meds tried: gabapentin , indomethacin , carbamazepine   Dx:  1.  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 complex regional pain syndrome; constant left sided headache, left face, eye, ear, pain; lacrimation and nasal drainage; constant since Aug 2024, starting a few days after dental procedure --> left mandibular crown replacement) - continue oxycodone , topiramate , trazodone , duloxetine  (per PMR for CRPS left arm 1987) - had reaction from carbamazepine  (flu like symptoms, rash) - follow up with [facility] re: MRI w/wo and gamma knife in dec 2025  SILENT CEREBELLAR INFARCT + INTRACRANIAL  ATHEROSCLEROSIS + TIA (~2009; bilateral vision loss and left sided tingling x hours) - aspirin  81mg  daily, rosuvastatin , DM and BP control  Return for return to pain mgmt and neurosurgery clinic.    EDUARD FABIENE HANLON, MD 09/03/2024, 3:30 PM Certified in Neurology, Neurophysiology and Neuroimaging  [facility] [address], [address] [place], [place] [address] [phone]

## 2024-09-04 LAB — HM DIABETES EYE EXAM

## 2024-09-05 ENCOUNTER — Ambulatory Visit: Payer: Self-pay

## 2024-09-05 ENCOUNTER — Ambulatory Visit: Admitting: Registered Nurse

## 2024-09-05 NOTE — Telephone Encounter (Signed)
 FYI Only or Action Required?: Action required by provider: medication refill request and clinical question for provider. Pt requesting script for abx be sent to her pharmacy on file. Was just at [facility] on 11/11 for UTI and abxs she received haven't cleared UTI  Patient was last seen in primary care on 08/31/2024 by Bennett Reuben POUR, MD.  Called Nurse Triage reporting Dysuria.  Symptoms began a week ago.  Interventions attempted: OTC medications: ibuprofen  and Prescription medications: Kelfex, percocet.  Symptoms are: unchanged.  Triage Disposition: See Physician Within 24 Hours  Patient/caregiver understands and will follow disposition?: No, wishes to speak with PCP  Reason for Disposition  [1] Taking antibiotic > 72 hours (3 days) for UTI AND [2] flank or lower back pain is SAME (unchanged, not better)  Answer Assessment - Initial Assessment Questions Went to [facility] for UTI symptoms on 11/11, performed UA and started on keflex x5 days. Pt reports some improvement in symptoms but with persistent low back pain and pressure in bladder. Pt requesting to have a longer or different script sent in for her symptoms to pharmacy on file given that she was just at [facility] and already performed a UA. Pt states if script unable to be sent in that she would be willing to come in for appt and would like to be called to be scheduled. Sending request to clinic. Advised UC or ED for worsening symptoms.   1. MAIN SYMPTOM: What is the main symptom you are concerned about? (e.g., painful urination, urine frequency)     Pressure with urination  2. BETTER-SAME-WORSE: Are you getting better, staying the same, or getting worse compared to how you felt at your last visit to the doctor (most recent medical visit)?     Slightly better but persists  3. PAIN: How bad is the pain?  (e.g., Scale 1-10; mild, moderate, or severe)     Denies  4. FEVER: Do you have a fever? If Yes, ask: What is it, how was it measured, and  when did it start?     Denies  5. OTHER SYMPTOMS: Do you have any other symptoms? (e.g., blood in the urine, flank pain, vaginal discharge)     6/10 low back pain, foul smelling urine  6. DIAGNOSIS: When was the UTI diagnosed? By whom? Was it a kidney infection, bladder infection or both?     11/11  7. ANTIBIOTIC: What antibiotic(s) are you taking? How many times per day?     Keflex x 5days  8. ANTIBIOTIC - START DATE: When did you start taking the antibiotic?     11/11  Protocols used: Urinary Tract Infection on Antibiotic Follow-up Call - [facility]

## 2024-09-05 NOTE — Telephone Encounter (Signed)
 Addressed via MyChart

## 2024-09-05 NOTE — Telephone Encounter (Signed)
 Patient scheduled with [facility] as all [facility] appts were taken at this time. No further needs  CRM # [id number] Primary Information  Source  [name], [name] (Patient)   Subject  [name], [name] (Patient)   Topic  Clinical - Red Word Triage    Communication  Red Word that prompted transfer to Nurse Triage: Patient having back pain and frequent/burning urination - she would like another medication. She was at [facility]11/25.

## 2024-09-05 NOTE — Telephone Encounter (Signed)
 First attempt to contact patient for triage.  Message states call cannot be completed at this time. Placed in callbacks for additional attempts   Copied from CRM [id number]. Topic: Clinical - Red Word Triage >> Sep 05, 2024 12:22 PM [name] wrote: Red Word that prompted transfer to Nurse Triage: Patient having back pain and frequent/burning urination - she would like another medication. She was at [facility]11/25. >> Sep 05, 2024 12:37 PM [name] wrote: Patient calling back after disconnecting while on hold trying to reach a nurse.

## 2024-09-06 ENCOUNTER — Ambulatory Visit (INDEPENDENT_AMBULATORY_CARE_PROVIDER_SITE_OTHER): Admitting: Family Medicine

## 2024-09-06 ENCOUNTER — Encounter: Payer: Self-pay | Admitting: Family Medicine

## 2024-09-06 ENCOUNTER — Other Ambulatory Visit (HOSPITAL_COMMUNITY)
Admission: RE | Admit: 2024-09-06 | Discharge: 2024-09-06 | Disposition: A | Discharge status: Home / Self Care | Source: Ambulatory Visit | Attending: Family Medicine | Admitting: Family Medicine

## 2024-09-06 VITALS — BP 109/70 | HR 78 | Temp 98.0°F | Resp 16 | Wt 163.6 lb

## 2024-09-06 DIAGNOSIS — R102 Pelvic and perineal pain unspecified side: Secondary | ICD-10-CM | POA: Insufficient documentation

## 2024-09-06 DIAGNOSIS — M545 Low back pain, unspecified: Secondary | ICD-10-CM | POA: Diagnosis not present

## 2024-09-06 DIAGNOSIS — R350 Frequency of micturition: Secondary | ICD-10-CM | POA: Diagnosis not present

## 2024-09-06 DIAGNOSIS — R10A3 Flank pain, bilateral: Secondary | ICD-10-CM | POA: Diagnosis not present

## 2024-09-06 LAB — POCT URINALYSIS DIPSTICK
Bilirubin, UA: NEGATIVE
Glucose, UA: POSITIVE — AB
Ketones, UA: NEGATIVE
Leukocytes, UA: NEGATIVE
Nitrite, UA: NEGATIVE
Protein, UA: NEGATIVE
Spec Grav, UA: 1.02 (ref 1.010–1.025)
Urobilinogen, UA: 0.2 U/dL
pH, UA: 6 (ref 5.0–8.0)

## 2024-09-06 MED ORDER — AMOXICILLIN-POT CLAVULANATE 875-125 MG PO TABS: 1.0000 | ORAL_TABLET | Freq: Two times a day (BID) | ORAL | 0 refills | Status: DC
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
## 2024-09-06 NOTE — Telephone Encounter (Signed)
 Lvm to call office

## 2024-09-07 ENCOUNTER — Ambulatory Visit: Payer: Self-pay | Admitting: Family Medicine

## 2024-09-07 LAB — CBC
Hematocrit: 39.1 % (ref 34.0–46.6)
Hemoglobin: 12.2 g/dL (ref 11.1–15.9)
MCH: 27.1 pg (ref 26.6–33.0)
MCHC: 31.2 g/dL — ABNORMAL LOW (ref 31.5–35.7)
MCV: 87 fL (ref 79–97)
Platelets: 290 x10E3/uL (ref 150–450)
RBC: 4.51 x10E6/uL (ref 3.77–5.28)
RDW: 13.5 % (ref 11.7–15.4)
WBC: 9.3 x10E3/uL (ref 3.4–10.8)

## 2024-09-07 LAB — URINALYSIS, MICROSCOPIC ONLY
Casts: NONE SEEN /LPF
RBC, Urine: >30 /HPF — AB (ref 0–2)

## 2024-09-07 LAB — CK: Total CK: 47 U/L (ref 32–182)

## 2024-09-07 LAB — COMPREHENSIVE METABOLIC PANEL WITH GFR
ALT: 15 IU/L (ref 0–32)
AST: 15 IU/L (ref 0–40)
Albumin: 4.6 g/dL (ref 3.8–4.9)
Alkaline Phosphatase: 93 IU/L (ref 49–135)
BUN/Creatinine Ratio: 22 (ref 12–28)
BUN: 16 mg/dL (ref 8–27)
Bilirubin Total: <0.2 mg/dL (ref 0.0–1.2)
CO2: 17 mmol/L — ABNORMAL LOW (ref 20–29)
Calcium: 9.4 mg/dL (ref 8.7–10.3)
Chloride: 109 mmol/L — ABNORMAL HIGH (ref 96–106)
Creatinine, Ser: 0.74 mg/dL (ref 0.57–1.00)
Globulin, Total: 2.1 g/dL (ref 1.5–4.5)
Glucose: 113 mg/dL — ABNORMAL HIGH (ref 70–99)
Potassium: 4.5 mmol/L (ref 3.5–5.2)
Sodium: 142 mmol/L (ref 134–144)
Total Protein: 6.7 g/dL (ref 6.0–8.5)
eGFR: 93 mL/min/1.73 (ref >59)

## 2024-09-07 LAB — SEDIMENTATION RATE: Sed Rate: 20 mm/h (ref 0–40)

## 2024-09-07 LAB — C-REACTIVE PROTEIN: CRP: 2 mg/L (ref 0–10)

## 2024-09-10 LAB — CERVICOVAGINAL ANCILLARY ONLY
Bacterial Vaginitis (gardnerella): NEGATIVE
Candida Glabrata: POSITIVE — AB
Candida Vaginitis: NEGATIVE
Chlamydia: NEGATIVE
Comment: NEGATIVE
Comment: NEGATIVE
Comment: NEGATIVE
Comment: NEGATIVE
Comment: NEGATIVE
Comment: NORMAL
Neisseria Gonorrhea: NEGATIVE
Trichomonas: NEGATIVE

## 2024-09-10 LAB — URINE CULTURE

## 2024-09-18 ENCOUNTER — Ambulatory Visit

## 2024-09-18 DIAGNOSIS — E538 Deficiency of other specified B group vitamins: Secondary | ICD-10-CM

## 2024-09-18 MED ORDER — ROSUVASTATIN CALCIUM 20 MG PO TABS: 20.0000 mg | ORAL_TABLET | Freq: Every day | ORAL | 3 refills | Status: AC

## 2024-09-18 MED ORDER — CYANOCOBALAMIN 1000 MCG/ML IJ SOLN
1000.0000 ug | Freq: Once | INTRAMUSCULAR | Status: AC
  Administered 2024-09-18: 1000 ug via INTRAMUSCULAR

## 2024-09-18 NOTE — Progress Notes (Signed)
 Per orders of Dr. Reuben Burkes , injection of B-12 given by Nellie Hummer in Right deltoid Patient tolerated injection well.

## 2024-09-20 ENCOUNTER — Ambulatory Visit: Admitting: Primary Care

## 2024-09-20 ENCOUNTER — Encounter: Payer: Self-pay | Admitting: Primary Care

## 2024-09-20 ENCOUNTER — Ambulatory Visit (INDEPENDENT_AMBULATORY_CARE_PROVIDER_SITE_OTHER)
Admission: RE | Admit: 2024-09-20 | Discharge: 2024-09-20 | Disposition: A | Source: Ambulatory Visit | Attending: Primary Care

## 2024-09-20 ENCOUNTER — Ambulatory Visit: Payer: Self-pay | Admitting: Primary Care

## 2024-09-20 VITALS — BP 116/66 | HR 81 | Temp 97.9°F | Ht 67.0 in | Wt 167.0 lb

## 2024-09-20 DIAGNOSIS — G43809 Other migraine, not intractable, without status migrainosus: Secondary | ICD-10-CM

## 2024-09-20 DIAGNOSIS — R102 Pelvic and perineal pain unspecified side: Secondary | ICD-10-CM

## 2024-09-20 DIAGNOSIS — R350 Frequency of micturition: Secondary | ICD-10-CM | POA: Insufficient documentation

## 2024-09-20 DIAGNOSIS — N3000 Acute cystitis without hematuria: Secondary | ICD-10-CM

## 2024-09-20 LAB — POC URINALSYSI DIPSTICK (AUTOMATED)
Bilirubin, UA: NEGATIVE
Blood, UA: NEGATIVE
Glucose, UA: POSITIVE — AB
Ketones, UA: NEGATIVE
Leukocytes, UA: NEGATIVE
Nitrite, UA: NEGATIVE
Protein, UA: POSITIVE — AB
Spec Grav, UA: 1.015 (ref 1.010–1.025)
Urobilinogen, UA: 0.2 U/dL
pH, UA: 6.5 (ref 5.0–8.0)

## 2024-09-20 MED ORDER — KETOROLAC TROMETHAMINE 60 MG/2ML IM SOLN
60.0000 mg | Freq: Once | INTRAMUSCULAR | Status: AC
  Administered 2024-09-20: 60 mg via INTRAMUSCULAR

## 2024-09-20 NOTE — Patient Instructions (Addendum)
 Will be in touch once we receive your urine culture results.  Complete xray(s) prior to leaving today. I will notify you of your results once received.  It was a pleasure meeting you!

## 2024-09-20 NOTE — Assessment & Plan Note (Addendum)
 Ongoing since acute cystitis diagnosis on 08/28/2024. Reviewed office notes and labs from 09/06/2024 from [facility] family practice.  Urinalysis today appears improved: No leukocytes, blood, nitrites.  Glucose is present which would be expected given Jardiance . Will check KUB plain films today to evaluate for renal stone.  We discussed that the x-ray may not detect a renal stone and that CT scan may be warranted.  She would like to proceed with x-ray.  We had a long discussion today regarding common causes for recurrent UTIs in women (i.e. menopause, urinary incontinence, poor hygiene).  It is possible that Jardiance  could be contributing.  Urine culture ordered and pending.  If culture remains positive then recommend that she discuss Jardiance  with PCP.  If urine culture is negative and x-ray is inconclusive then we will proceed with CT.    She agrees. Await results.

## 2024-09-20 NOTE — Progress Notes (Signed)
 Subjective:    Patient ID: [name], female    DOB: 02-27-1966, 58 y.o.   MRN: [id number]  [name] is a very pleasant 58 y.o. female patient of Dr. Bennett with a history of hypertension, migraines, type 2 diabetes managed on Jardiance , sleep apnea, celiac, who presents today for revaluation of UTI and headache.   She was evaluated on 09/06/2024 at [facility] for ongoing symptoms of low back pain, urinary frequency despite antibiotic treatment that was initiated 5 days prior.  Originally diagnosed with acute cystitis on 08/28/24, culture positive for Klebsiella oxytoca, treated with Keflex  course.  During her visit on 09/06/2024 she underwent extensive lab work.  Urinalysis negative for leukocytes, positive for blood and glucose.  Urine culture returned positive for Klebsiella oxytoca, treated with Augmentin  which was culture sensitive.  Also treated with OTC Monistat for candida infection per vaginal swab.  Today she continues to experience ongoing urinary frequency, pelvic pressure, and nausea. She has been taking Jardiance  for about 1 year. She completed her antibiotic as prescribed. She denies hematuria, flank pain, fevers. She has a history of recurrent uti years ago, no problems until now.  She does have a family history of renal stones in her father who had 200 stones in his kidney.  Her pain initially began to her flank and has moved around to the pelvis.  Her headache is located to the bilateral frontal and occipital lobes. She currently managed on Topamax  300 mg daily for migraine prevention which has helped tremendously with headaches. Two days ago she developed a sudden headache shortly after her B12 injection. Her headache feels like a migraine which has persisted. She is not on abortive migraine treatment due to her high blood pressure.    Review of Systems  Constitutional:  Negative for fever.  Genitourinary:  Positive for frequency.  Negative for dysuria, hematuria and vaginal discharge.         Past Medical History:  Diagnosis Date   Allergy    Anxiety    Arthritis    just the norm (01/24/2013)   ASCVD (arteriosclerotic cardiovascular disease)    Asthma    Celiac disease    Daily headache    depends on the season (01/24/2013)   Diabetes mellitus without complication (HCC)    Fibromyalgia    GERD (gastroesophageal reflux disease)    History of colonic polyps    Hyperlipidemia    Hypertension    Migraines    Obesity (BMI 30.0-34.9)    RSD (reflex sympathetic dystrophy)    Sleep apnea    NO MACHINE RECOMMENDED   TIA (transient ischemic attack) ~ 2009    Social History   Socioeconomic History   Marital status: Married    Spouse name: Not on file   Number of children: 2   Years of education: Not on file   Highest education level: Not on file  Occupational History   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  Tobacco Use   Smoking status: Never    Passive exposure: Current   Smokeless tobacco: Never  Vaping Use   Vaping status: Never Used  Substance and Sexual Activity   Alcohol  use: Not Currently    Comment: 01/24/2013 drink or 2 once or /twice/year, if that   Drug use: No   Sexual activity: Yes    Birth control/protection: None  Other Topics Concern   Not on file  Social History Narrative   No living will   Would want  husband as POA--then sister, Barnie   Would accept resuscitation attempts   Probably would not want tube feeds if cognitively unaware   Pt lives with family    Pt doesn't work    Social Drivers of Corporate Investment Banker Strain: Not on file  Food Insecurity: Not on file  Transportation Needs: Not on file  Physical Activity: Not on file  Stress: Not on file  Social Connections: Not on file  Intimate Partner Violence: Not on file    Past Surgical History:  Procedure Laterality Date   APPENDECTOMY  ~ 06/2007   BLADDER REPAIR  ~ 06/2007   same day after bladder lift  (01/24/2013)   BLADDER SUSPENSION  ~ 06/2007   BREAST BIOPSY Left    DIAGNOSTIC LAPAROSCOPY  1990's & ~ 2000   I've had a couple; for endometrosis (01/24/2013)   HERNIA REPAIR     INCISIONAL HERNIA REPAIR N/A 01/24/2013   Procedure: LAPAROSCOPIC INCISIONAL HERNIA;  Surgeon: Vicenta DELENA Poli, MD;  Location: [facility] OR;  Service: General;  Laterality: N/A;   INSERTION OF MESH N/A 01/24/2013   Procedure: INSERTION OF MESH;  Surgeon: Vicenta DELENA Poli, MD;  Location: [facility] OR;  Service: General;  Laterality: N/A;   LAPAROSCOPIC INCISIONAL / UMBILICAL / VENTRAL HERNIA REPAIR  01/24/2013   IHR w/mesh/notes   NASAL SEPTUM SURGERY  1980's?   OOPHORECTOMY Right 2009   TONSILLECTOMY  1990's   VAGINAL HYSTERECTOMY  ` 06/2007    Family History  Problem Relation Age of Onset   Hypertension Mother    Lung cancer Mother    Colon cancer Father    Hypertension Sister    Cancer Sister    Hyperlipidemia Sister    Breast cancer Sister 29   Breast cancer Paternal Aunt    Birth defects Maternal Grandmother    Breast cancer Maternal Grandmother    Ovarian cancer Maternal Grandmother    Birth defects Paternal Grandmother        uterine, stomach, lung   Breast cancer Paternal Grandmother    Heart attack Paternal Grandfather    Heart failure Paternal Grandfather    Cancer Brother     Allergies  Allergen Reactions   Baclofen     Meperidine  Anaphylaxis, Itching and Swelling    REACTION: swelling   Carbamazepine  Other (See Comments)    Rash, body aches, tongue spasms   Cymbalta  [Duloxetine  Hcl] Hives    Generic makes her feel like she is crawling out of her skin   Diazepam Hives    REACTION: swelling   Other     Axe Cologne   Shellfish Allergy Hives    Current Outpatient Medications on File Prior to Visit  Medication Sig Dispense Refill   albuterol  (VENTOLIN  HFA) 108 (90 Base) MCG/ACT inhaler Inhale 2 puffs into the lungs every 6 (six) hours as needed for wheezing or shortness of breath. 18 g 1    aspirin  EC 81 MG tablet Take 1 tablet (81 mg total) by mouth daily. Swallow whole.     budesonide -formoterol  (SYMBICORT ) 160-4.5 MCG/ACT inhaler Inhale 2 puffs into the lungs daily. 1 each 12   clobetasol  ointment (TEMOVATE ) 0.05 % Apply 1 Application topically 2 (two) times daily.     cyanocobalamin  (VITAMIN B12) 1000 MCG/ML injection Inject 1,000 mcg into the muscle every 30 (thirty) days.     DULoxetine  (CYMBALTA ) 60 MG capsule TAKE 1 CAPSULE BY MOUTH EVERY DAY 90 capsule 3   fluticasone  (FLONASE ) 50 MCG/ACT nasal spray SPRAY  2 SPRAYS INTO EACH NOSTRIL EVERY DAY 48 mL 2   ibuprofen  (ADVIL ) 600 MG tablet TAKE 1 TABLET (600 MG TOTAL) BY MOUTH EVERY 8 HOURS AS NEEDED FOR MILD PAIN (PAIN SCORE 1-3) 90 tablet 5   JARDIANCE  10 MG TABS tablet TAKE 1 TABLET BY MOUTH DAILY BEFORE BREAKFAST. 30 tablet 11   loratadine (CLARITIN) 10 MG tablet Take 10 mg by mouth daily.     metFORMIN  (GLUCOPHAGE -XR) 500 MG 24 hr tablet Take 2 tablets (1,000 mg total) by mouth daily with breakfast. 180 tablet 0   montelukast  (SINGULAIR ) 10 MG tablet Take 1 tablet (10 mg total) by mouth at bedtime. 90 tablet 1   naloxone  (NARCAN ) nasal spray 4 mg/0.1 mL Use as directed if lethargic or respiratory depression after opioid use 2 each 0   olmesartan  (BENICAR ) 40 MG tablet Take 1 tablet (40 mg total) by mouth daily. 90 tablet 0   omeprazole (PRILOSEC) 40 MG capsule Take 40 mg by mouth 2 (two) times daily.     ondansetron  (ZOFRAN -ODT) 4 MG disintegrating tablet Take 4 mg by mouth daily as needed.     oxyCODONE -acetaminophen  (PERCOCET) 10-325 MG tablet Take 1 tablet by mouth 4 (four) times daily as needed for pain. 120 tablet 0   rosuvastatin  (CRESTOR ) 20 MG tablet Take 1 tablet (20 mg total) by mouth daily. 90 tablet 3   tiZANidine  (ZANAFLEX ) 4 MG tablet Take 1 tablet (4 mg total) by mouth every 6 (six) hours as needed for muscle spasms. 120 tablet 3   topiramate  (TOPAMAX ) 100 MG tablet TAKE 1 TABLET BY MOUTH EVERY MORNING AND TAKE  2 TABLETS BY MOUTH EVERY EVENING 270 tablet 1   traZODone  (DESYREL ) 50 MG tablet TAKE 1 AND 1/2 TABLETS BY MOUTH AT BEDTIME 135 tablet 1   [DISCONTINUED] losartan  (COZAAR ) 100 MG tablet TAKE 1 TABLET BY MOUTH EVERY DAY 90 tablet 0   No current facility-administered medications on file prior to visit.    BP 116/66   Pulse 81   Temp 97.9 F (36.6 C) (Oral)   Ht 5' 7 (1.702 m)   Wt 167 lb (75.8 kg)   SpO2 98%   BMI 26.16 kg/m  Objective:   Physical Exam Cardiovascular:     Rate and Rhythm: Normal rate and regular rhythm.  Pulmonary:     Effort: Pulmonary effort is normal.     Breath sounds: Normal breath sounds.  Musculoskeletal:     Cervical back: Neck supple.  Skin:    General: Skin is warm and dry.  Neurological:     Mental Status: She is alert and oriented to person, place, and time.  Psychiatric:        Mood and Affect: Mood normal.     Physical Exam        Assessment & Plan:  Urinary frequency Assessment & Plan: Ongoing since acute cystitis diagnosis on 08/28/2024. Reviewed office notes and labs from 09/06/2024 from [facility].  Urinalysis today appears improved: No leukocytes, blood, nitrites.  Glucose is present which would be expected given Jardiance . Will check KUB plain films today to evaluate for renal stone.  We discussed that the x-ray may not detect a renal stone and that CT scan may be warranted.  She would like to proceed with x-ray.  We had a long discussion today regarding common causes for recurrent UTIs in women (i.e. menopause, urinary incontinence, poor hygiene).  It is possible that Jardiance  could be contributing.  Urine culture ordered and  pending.  If culture remains positive then recommend that she discuss Jardiance  with PCP.  If urine culture is negative and x-ray is inconclusive then we will proceed with CT.    She agrees. Await results.  Orders: -     POCT Urinalysis Dipstick (Automated) -     Urine Culture -      DG Abd 1 View  Migraine variant Assessment & Plan: Uncontrolled today.  Discussed options for treatment.  A prescription for Imitrex was offered to use as needed for migraine abortion, she kindly declines. Toradol  60 mg provided intramuscularly today.  She agrees.  Orders: -     Ketorolac  Tromethamine   Pelvic pressure in female -     DG Abd 1 View    Assessment and Plan Assessment & Plan         Comer MARLA Gaskins, NP

## 2024-09-20 NOTE — Assessment & Plan Note (Signed)
 Uncontrolled today.  Discussed options for treatment.  A prescription for Imitrex was offered to use as needed for migraine abortion, she kindly declines. Toradol  60 mg provided intramuscularly today.  She agrees.

## 2024-09-23 DIAGNOSIS — G43809 Other migraine, not intractable, without status migrainosus: Secondary | ICD-10-CM

## 2024-09-23 LAB — URINE CULTURE
MICRO NUMBER:: 17313232
SPECIMEN QUALITY:: ADEQUATE

## 2024-09-23 MED ORDER — SULFAMETHOXAZOLE-TRIMETHOPRIM 800-160 MG PO TABS: 1.0000 | ORAL_TABLET | Freq: Two times a day (BID) | ORAL | 0 refills | Status: AC

## 2024-09-24 NOTE — Telephone Encounter (Signed)
 Fyi, last OV- 09/20/24 with Mallie.

## 2024-09-25 MED ORDER — SUMATRIPTAN SUCCINATE 50 MG PO TABS: ORAL_TABLET | ORAL | 0 refills | Status: AC

## 2024-09-25 NOTE — Addendum Note (Signed)
 Addended by: Lilana Blasko K on: 09/25/2024 01:38 PM   Modules accepted: Orders

## 2024-10-03 ENCOUNTER — Other Ambulatory Visit: Payer: Self-pay | Admitting: Registered Nurse

## 2024-10-03 ENCOUNTER — Ambulatory Visit: Admitting: Registered Nurse

## 2024-10-04 ENCOUNTER — Ambulatory Visit: Payer: Self-pay

## 2024-10-04 NOTE — Progress Notes (Signed)
 Stereotactic radiosurgery discharge instructions are given to patient and her husband, including potential risks and side effects.  Emergency contact information is provided.  No barriers are identified to understanding these instructions. Discharge medication is not given. Return to clinic in 6 months with a brain MRI scan. All questions answered to their satisfaction.

## 2024-10-06 NOTE — Procedures (Signed)
 DATES OF PROCEDURE: 10/04/2024 PROCEDURE: Stereotactic Radiosurgery   PREOPERATIVE DIAGNOSIS: Trigeminal Neuralgia POSTOPERATIVE DIAGNOSIS: Trigeminal Neuralgia   SURGEON: Elspeth Ahle, MD    NARRATIVE The options, alternatives, rationale, benefits, uncertainties, and risks of the procedure and perioperative course were explained to the patient.  Specific risks of the surgery discussed included damage to underlying neurological structures producing specific neurologic deficits; failure of the procedure to adequately treat the pain or alleviate symptoms; development of cerebral edema or hemorrhage resulting in neurologic decline after the procedure;   After informed consent was obtained, the patient was brought to the radiation oncology suite. The stereotactic frame was placed. A pre-operative scan was then obtained and this was merged with other images on the Brain LAB workstation.  Critical radiosensitive structures and lesions to be irradiated were then outlined by the physicist in Radiation Oncology along with the radiation oncology attending.  Critical structures, lesions to be irradiated, and the radiosurgery plan were then reviewed and validated together by the radiation oncology attending, Dr. Michaela and Dr. Ahle.    For the Left trigeminal nerve root entry zone, 80 Gy was delivered one  treatment  The patient was re-examined by the radiation oncology team and was found to have a stable neurologic examination.  Discharge instructions and discharge medications were reviewed.  The patient was told to contact us  immediately if they had any daily pain or drainage from any of the pin sites or if they developed any increasing headache or other neurologic symptoms or signs.   FINDINGS: We treated:      Plan ID: [id number]  Technique: DCA (4 mm Cone), 8 arcs (820 degrees)  Prescription: 80 Gy x 1 to Isocenter  OAR Stats: BS Max Dose: 28.9 Gy

## 2024-10-08 NOTE — Discharge Summary (Signed)
 RADIATION ONCOLOGY TREATMENT COMPLETION SUMMARY  [name] [id number]  Diagnosis: Left trigeminal neuralgia Histology:   Stage:           Narrative: 58 year old female seen in consultation at the request of Dr. Marcey Ahle for evaluation and potential radiotherapy.  Please see my consult note of 07/27/24 for a detailed history and physical.  She reports that during a dental procedure ~11 months ago, after receiving multiple lidocaine  injections she was still having a great deal of left-sided face pain, and that night had horrible left sided face pain that was different from normal migraines. The pain progressively worsened, to include 10/10 electrical shock type pain mostly radiating from her left ear to her chin. Always stopping at mid line. Less severe but also present in her forehead and face. Triggered by eating, brushing teeth, and any light touch. She has lost 40lbs unintentionally. She also notes that she has had ongoing left-sided shoulder neck and leg pain from her CPRS, but this has also worsened and she feels more weak on that side. She also noticed worse hearing on the left, along with some tongue twitching. She has also developed hoarseness over a similar time scale that has been persistent. Was seen by a local ent that thought it was 'related to her inhaler', she also saw GI and trialed omeprazole for GERD which did not help. She saw a neurologist who Rx carbamezapine for the facial pain and it improved dramatically, however she developed a severe full body reaction after about a month and had to stop. Attempts with gabapentin , lyrica, and all of the pain meds she takes for CPRS have not helped at all. On 06/25/24, a non-contrasted OSH MRI reported an Unremarkable MRI of the head and trigeminal nerves  Dr Ahle assessed this study, found no role for surgery at this time, and referred her to us  for consideration of RT.  A subsequent high-resolution MRI on 09/07/2024 showed no evidence  of malignancy in the BOS. I recommended stereotactic radiosurgery and, after a thorough review of the plan for radiotherapy including risks, benefits, side effects, logistics and uncertainties, she freely elected to proceed.          Simulation Type/Date: [facility] 10/04/24 Region Treated                                 & Technique Total Dose (cGy) Elapsed Days Dose/Fraction (cGy) Treatment Dates  Left TGN DREZ, 8 arcs (820 deg), 4mm Cone, SRS 8000 1 8000 10/04/24    Check all that apply:  X  Completed as planned    ___Unplanned Break ___Unplanned admission   ___Did not complete   ___Died during treatment   Clinical Course and Patient Disposition:  The plan for stereotactic radiosurgery, including risks, benefits, side effects, limitations, and logistics, was reviewed at length with the patient; she freely elected to proceed. On 10/04/2024, a stereotactic headframe was [laced under the direction of Dr. Ahle (note dictated separately), a head CT performed, the CT and diaagnostic MR images fused, and the target volume mand normal tissue structures contoured on a slice-by-slice basis.  A variety of stereotactic radiosurgery plans were generated and, following critical review by Drs. Michaela and [place], the optimal plan described abovewas selected. After scrupulous quality assurance, which included image guidance via cone-beam CT, the patient underwent stereotactic radiosurgery, which she tolerated quite well.  The headframe was atraumatically removed and she was discharged home  on no dexamethasone .   She will follow up with her other care providers as scheduled, returning to our clinic in 6 months with a brain MRI.    Radiation Oncology Attending Physician Norleen Bellis, MD, PhD   cc:   Elspeth Ahle, MD          Suzanne Charlie FERNS, MD

## 2024-10-09 ENCOUNTER — Other Ambulatory Visit: Payer: Self-pay

## 2024-10-16 ENCOUNTER — Encounter: Payer: Worker's Compensation | Attending: Registered Nurse | Admitting: Registered Nurse

## 2024-10-16 VITALS — BP 129/80 | HR 73 | Ht 67.0 in | Wt 167.0 lb

## 2024-10-16 DIAGNOSIS — G90512 Complex regional pain syndrome I of left upper limb: Secondary | ICD-10-CM | POA: Insufficient documentation

## 2024-10-16 DIAGNOSIS — G4700 Insomnia, unspecified: Secondary | ICD-10-CM | POA: Insufficient documentation

## 2024-10-16 DIAGNOSIS — F3289 Other specified depressive episodes: Secondary | ICD-10-CM | POA: Diagnosis not present

## 2024-10-16 DIAGNOSIS — M25511 Pain in right shoulder: Secondary | ICD-10-CM | POA: Insufficient documentation

## 2024-10-16 DIAGNOSIS — Z5181 Encounter for therapeutic drug level monitoring: Secondary | ICD-10-CM | POA: Diagnosis not present

## 2024-10-16 DIAGNOSIS — M546 Pain in thoracic spine: Secondary | ICD-10-CM | POA: Insufficient documentation

## 2024-10-16 DIAGNOSIS — G8929 Other chronic pain: Secondary | ICD-10-CM | POA: Diagnosis present

## 2024-10-16 DIAGNOSIS — M25512 Pain in left shoulder: Secondary | ICD-10-CM | POA: Insufficient documentation

## 2024-10-16 DIAGNOSIS — G894 Chronic pain syndrome: Secondary | ICD-10-CM | POA: Insufficient documentation

## 2024-10-16 DIAGNOSIS — Z79891 Long term (current) use of opiate analgesic: Secondary | ICD-10-CM | POA: Diagnosis not present

## 2024-10-16 MED ORDER — OXYCODONE-ACETAMINOPHEN 10-325 MG PO TABS: 1.0000 | ORAL_TABLET | Freq: Four times a day (QID) | ORAL | 0 refills | Status: AC | PRN

## 2024-10-16 NOTE — Progress Notes (Unsigned)
 "  Subjective:    Patient ID: [name], female    DOB: [birth year], 58 y.o.   MRN: [id number]  HPI: [name] is a 58 y.o. female who returns for follow up appointment for chronic pain and medication refill. states *** pain is located in  ***. rates pain ***. current exercise regime is walking and performing stretching exercises.  Ms.nLyons-Giarraputo Morphine  equivalent is *** MME.   Last oral swab was performed on 08/23/2024,it was consistent.     Pain Inventory Average Pain 8 Pain Right Now 8 My pain is constant, sharp, burning, stabbing, tingling, and aching  In the last 24 hours, has pain interfered with the following? General activity 7 Relation with others 3 Enjoyment of life 3 What TIME of day is your pain at its worst? morning , daytime, evening, and night Sleep (in general) Good  Pain is worse with: walking, bending, sitting, standing, and some activites Pain improves with: rest, heat/ice, pacing activities, and medication Relief from Meds: 6  Family History  Problem Relation Age of Onset   Hypertension Mother    Lung cancer Mother    Colon cancer Father    Hypertension Sister    Cancer Sister    Hyperlipidemia Sister    Breast cancer Sister 68   Breast cancer Paternal Aunt    Birth defects Maternal Grandmother    Breast cancer Maternal Grandmother    Ovarian cancer Maternal Grandmother    Birth defects Paternal Grandmother        uterine, stomach, lung   Breast cancer Paternal Grandmother    Heart attack Paternal Grandfather    Heart failure Paternal Grandfather    Cancer Brother    Social History   Socioeconomic History   Marital status: Married    Spouse name: Not on file   Number of children: 2   Years of education: Not on file   Highest education level: Not on file  Occupational History   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  Tobacco Use   Smoking status: Never    Passive exposure: Current   Smokeless tobacco: Never  Vaping  Use   Vaping status: Never Used  Substance and Sexual Activity   Alcohol  use: Not Currently    Comment: 01/24/2013 drink or 2 once or /twice/year, if that   Drug use: No   Sexual activity: Yes    Birth control/protection: None  Other Topics Concern   Not on file  Social History Narrative   No living will   Would want husband as POA--then sister, Barnie   Would accept resuscitation attempts   Probably would not want tube feeds if cognitively unaware   Pt lives with family    Pt doesn't work    Social Drivers of Health   Tobacco Use: Medium Risk (09/20/2024)   Patient History    Smoking Tobacco Use: Never    Smokeless Tobacco Use: Never    Passive Exposure: Current  Financial Resource Strain: Not on file  Food Insecurity: Not on file  Transportation Needs: Not on file  Physical Activity: Not on file  Stress: Not on file  Social Connections: Not on file  Depression (PHQ2-9): High Risk (09/20/2024)   Depression (PHQ2-9)    PHQ-2 Score: 11  Alcohol  Screen: Not on file  Housing: Not on file  Utilities: Not on file  Health Literacy: Not on file   Past Surgical History:  Procedure Laterality Date   APPENDECTOMY  ~ 06/2007   BLADDER REPAIR  ~  06/2007   same day after bladder lift (01/24/2013)   BLADDER SUSPENSION  ~ 06/2007   BREAST BIOPSY Left    DIAGNOSTIC LAPAROSCOPY  1990's & ~ 2000   I've had a couple; for endometrosis (01/24/2013)   HERNIA REPAIR     INCISIONAL HERNIA REPAIR N/A 01/24/2013   Procedure: LAPAROSCOPIC INCISIONAL HERNIA;  Surgeon: Vicenta DELENA Poli, MD;  [facility];  Service: General;  Laterality: N/A;   INSERTION OF MESH N/A 01/24/2013   Procedure: INSERTION OF MESH;  Surgeon: Vicenta DELENA Poli, MD;  Location[facility];  Service: General;  Laterality: N/A;   LAPAROSCOPIC INCISIONAL / UMBILICAL / VENTRAL HERNIA REPAIR  01/24/2013   IHR w/mesh/notes   NASAL SEPTUM SURGERY  1980's?   OOPHORECTOMY Right 2009   TONSILLECTOMY  1990's   VAGINAL HYSTERECTOMY  `  06/2007   Past Surgical History:  Procedure Laterality Date   APPENDECTOMY  ~ 06/2007   BLADDER REPAIR  ~ 06/2007   same day after bladder lift (01/24/2013)   BLADDER SUSPENSION  ~ 06/2007   BREAST BIOPSY Left    DIAGNOSTIC LAPAROSCOPY  1990's & ~ 2000   I've had a couple; for endometrosis (01/24/2013)   HERNIA REPAIR     INCISIONAL HERNIA REPAIR N/A 01/24/2013   Procedure: LAPAROSCOPIC INCISIONAL HERNIA;  Surgeon: Vicenta DELENA Poli, MD;  [facility];  Service: General;  Laterality: N/A;   INSERTION OF MESH N/A 01/24/2013   Procedure: INSERTION OF MESH;  Surgeon: Vicenta DELENA Poli, MD;  [facility];  Service: General;  Laterality: N/A;   LAPAROSCOPIC INCISIONAL / UMBILICAL / VENTRAL HERNIA REPAIR  01/24/2013   IHR w/mesh/notes   NASAL SEPTUM SURGERY  1980's?   OOPHORECTOMY Right 2009   TONSILLECTOMY  1990's   VAGINAL HYSTERECTOMY  ` 06/2007   Past Medical History:  Diagnosis Date   Allergy    Anxiety    Arthritis    just the norm (01/24/2013)   ASCVD (arteriosclerotic cardiovascular disease)    Asthma    Celiac disease    Daily headache    depends on the season (01/24/2013)   Diabetes mellitus without complication (HCC)    Fibromyalgia    GERD (gastroesophageal reflux disease)    History of colonic polyps    Hyperlipidemia    Hypertension    Migraines    Obesity (BMI 30.0-34.9)    RSD (reflex sympathetic dystrophy)    Sleep apnea    NO MACHINE RECOMMENDED   TIA (transient ischemic attack) ~ 2009   BP 129/80   Pulse 73   Ht 5' 7 (1.702 m)   Wt 167 lb (75.8 kg)   SpO2 98%   BMI 26.16 kg/m   Opioid Risk Score:   Fall Risk Score:  `1  Depression screen [facility] 2/9     09/20/2024   11:29 AM 08/31/2024    3:12 PM 08/23/2024    2:53 PM 06/11/2024    3:16 PM 04/06/2024   10:11 AM 02/27/2024    2:19 PM 02/22/2024    1:21 PM  Depression screen PHQ 2/9  Decreased Interest 1 1 1 1  0 0 0  Down, Depressed, Hopeless 1 1 0 1 0 0 0  PHQ - 2 Score 2 2 1 2  0 0 0  Altered  sleeping 3 1       Tired, decreased energy 3 2       Change in appetite 1 1       Feeling bad or  failure about yourself  1 1       Trouble concentrating 1 1       Moving slowly or fidgety/restless 0 1       Suicidal thoughts 0 0       PHQ-9 Score 11 9       Difficult doing work/chores Somewhat difficult Somewhat difficult         Review of Systems  Musculoskeletal:        Left arm pain  All other systems reviewed and are negative.      Objective:   Physical Exam        Assessment & Plan:  1.Cervicalgia/ Cervical Radiculitis:  No Complaints today. Continue to Monitor. 08/23/2024 2.Complex Regional Pain Syndrome Type 1: Continue Topamax  and Cymbalta . 08/23/2024.  RX: Oxycodone   10/325mg  one tablet  4 times  a day as needed for pain #120, Hydrocodone  discontinued  and  Tramadol   was discontinued due contraindicated with Tegretol  being prescribed by neurology. We will continue the opioid monitoring program, this consists of regular clinic visits, examinations, urine drug screen, pill counts as well as use of [place]  Controlled Substance Reporting system. A 12 month History has been reviewed on the [place]  Controlled Substance Reporting System  On 08/23/2024. 3. Depression: with Physical Illness:  Continue current medication regimen with Cymbalta .08/23/2024 4. Insomnia: Continue current medication regimen with  Trazodone  one and half tablet at bedtime  11/06//2025 5. Muscle Spasms: Continue current medication regimen. 08/23/2024 6. Chronic Pain Syndrome: Continue Ibuprofen  BID as needed. 08/23/2024 7. Migraine without Aura: Continue Topamax : Continue to Monitor. 08/23/2024 8. Dysphonia: She reports she was seen by ENT. S PCP Following. Continue to monitor.  11/062025 9. Trigeminal Neuralgia: Ms. Sallye Racer reports her dentist Dr Raynaldo diagnose her with Trigeminal Neuralgia. Neurology Following.  11/062025   F/U in 1 month    "

## 2024-10-17 ENCOUNTER — Encounter: Payer: Self-pay | Admitting: Registered Nurse

## 2024-10-17 ENCOUNTER — Other Ambulatory Visit: Payer: Self-pay | Admitting: Family

## 2024-10-23 ENCOUNTER — Ambulatory Visit

## 2024-11-08 ENCOUNTER — Other Ambulatory Visit

## 2024-11-08 DIAGNOSIS — E538 Deficiency of other specified B group vitamins: Secondary | ICD-10-CM

## 2024-11-08 DIAGNOSIS — G6289 Other specified polyneuropathies: Secondary | ICD-10-CM

## 2024-11-08 LAB — VITAMIN B12: Vitamin B-12: 313 pg/mL (ref 211–911)

## 2024-11-13 LAB — VITAMIN B6: Vitamin B6: 6.8 ng/mL (ref 2.1–21.7)

## 2024-11-15 ENCOUNTER — Ambulatory Visit (INDEPENDENT_AMBULATORY_CARE_PROVIDER_SITE_OTHER)

## 2024-11-15 VITALS — BP 120/80 | HR 74 | Temp 98.1°F | Ht 67.0 in | Wt 169.0 lb

## 2024-11-15 DIAGNOSIS — F5101 Primary insomnia: Secondary | ICD-10-CM

## 2024-11-15 DIAGNOSIS — F419 Anxiety disorder, unspecified: Secondary | ICD-10-CM

## 2024-11-15 DIAGNOSIS — Z113 Encounter for screening for infections with a predominantly sexual mode of transmission: Secondary | ICD-10-CM | POA: Diagnosis not present

## 2024-11-15 DIAGNOSIS — K5900 Constipation, unspecified: Secondary | ICD-10-CM | POA: Diagnosis not present

## 2024-11-15 DIAGNOSIS — Z0000 Encounter for general adult medical examination without abnormal findings: Secondary | ICD-10-CM | POA: Diagnosis not present

## 2024-11-15 DIAGNOSIS — E538 Deficiency of other specified B group vitamins: Secondary | ICD-10-CM | POA: Diagnosis not present

## 2024-11-15 MED ORDER — DULOXETINE HCL 60 MG PO CPEP: 60.0000 mg | ORAL_CAPSULE | Freq: Every day | ORAL | Status: AC

## 2024-11-15 MED ORDER — VITAMIN B-12 1000 MCG PO TABS: 1000.0000 ug | ORAL_TABLET | Freq: Every day | ORAL | 3 refills | Status: AC

## 2024-11-15 MED ORDER — TRAZODONE HCL 50 MG PO TABS: ORAL_TABLET | ORAL | 1 refills | Status: AC

## 2024-11-15 MED ORDER — SENNA-DOCUSATE SODIUM 8.6-50 MG PO TABS: 2.0000 | ORAL_TABLET | Freq: Every evening | ORAL | 3 refills | Status: AC

## 2024-11-15 NOTE — Progress Notes (Signed)
 "  Assessment & Plan:   This visit was conducted in person. The patient gave informed consent to the use of [employer] AI technology to record the contents of the encounter as documented below.  Assessment & Plan Physical Age-appropriate screening, counseling and vaccines discussed with patient. Healthy diet and exercise recommendations given. Tetanus and shingles vaccinations due.  I have personally reviewed and have noted: 1. The patient's medical and social history 2. Their use of alcohol , tobacco or illicit drugs 3. Their current medications and supplements 4. The patient's functional ability including ADL's, fall risks, home safety risks and hearing or visual impairment. 5. Diet and physical activities 6. Evidence for depression or mood disorder   Colonoscopy: Reportedly done in 2024 through [facility] medical, reported precancerous polyps with repeat in a few years recommended, will retrieve record Mammogram: UTD, WNL Pap: Has had hysterectomy, unsure if she has cervix or not, currenlty follows with Dr Starla, she will schedule f/u  Labs: HIV, Hep C ordered Immunizations: Td today, Shingles from pharmacy, declined covid Diet and Exercise: Improving, needs to increase exercise Sleep and mood: sleeping poorly,  feeling anxious Dental and vision: UTD ADLs and IADLs: Capable Cognitive: Intact to orientation, naming, recall and repetition Fall risk and home safety: No concerns Health counselling given: Healthy diet given DM  Advanced Directives Patient does not have advanced directives, needs to set up, counseled.  - Advised obtaining tetanus and shingles vaccinations from pharmacy. - Scheduled follow-up with gynecologist for Pap smear. - Continue annual mammogram screenings.  Primary insomnia Chronic insomnia persists. Current trazodone  dose ineffective. - Increased trazodone  to 100 mg nightly. If no improvement after 5 days, increase to 150 mg nightly.  Anxiety  disorder Anxiety affects daily functioning. Current Cymbalta  dose increased. - Increased Cymbalta  to 120 mg daily by taking two 30 mg tablets and one 60 mg tablet. - Will review anxiety symptoms in 4 weeks.  If unimproved, would consider augmenting with BuSpar.  Constipation Chronic constipation has always been an issue for her, but likely worsened by  opioid use.  - Added Senna 2 tablets nightly to current regimen. - Continue daily Dulcolax  Vitamin B12 deficiency Previously managed with injections.  Most recent B12 a few months ago WNL, will switch to oral supplementation. - Prescribed oral cyanocobalamin  1000 mcg daily. - Will reassess B12 levels in 6 months.    Follow-up: Return in about 1 year (around 11/15/2025) for CPE with fasting labs 1 wk prior. Will discuss sleep/mood at next OV.        Subjective:   Patient ID: [name], female    DOB: June 25, 1966  Age: 59 y.o. MRN: [id number]  Patient Care Team: Bennett Reuben POUR, MD as PCP - General (Family Medicine) Darliss Rogue, MD as PCP - Cardiology (Cardiology) Carilyn Prentice BRAVO, MD as Consulting Physician (Physical Medicine and Rehabilitation)   CC:  Chief Complaint  Patient presents with   Annual Exam    Medicare Wellness Exam scheduled for 01-16-25.  Had radiation to help with Trigeminal Neuralgia. Can take awhile to work is what she was told.      [name] is a 59 y.o. female here for annual physical and f/u on chronic conditions, see assessment and plan for further details Acute complaints of constipation, insomnia and anxiety also addressed, see assessment and plan for further details  Discussed the use of AI scribe software for clinical note transcription with the patient, who gave verbal consent to proceed.  History of Present  Illness [name] is a 59 year old female with chronic pain and anxiety who presents for a comprehensive review of her medical history and current  treatments.  She underwent a colonoscopy and endoscopy last year at [facility], where several polyps were found, some precancerous. She is currently awaiting the transfer of these records for further review.  She had a mammogram in September 2025, which was negative. She follows with a gynecologist in [address] and is due for a follow-up. She had a hysterectomy in the past, with removal of all but one ovary, which was adhered to her bladder. She is unsure if her cervix was removed.  She rarely consumes alcohol , drinking once or twice a year, and smoked as a teenager but does not smoke or vape now.  She was previously diagnosed with celiac disease, but reports that her last two endoscopies did not show evidence of celiac disease according to her gastroenterologist. She does not follow a gluten-free diet.  She has a history of vitamin B12 deficiency, previously managed with injections due to poor absorption from oral supplements.  She experiences chronic pain, which makes walking difficult, and uses a cycling machine for exercise. She has significant foot pain and difficulty sleeping, with both trouble falling and staying asleep. She takes trazodone  75 mg at night, which initially helped but is no longer effective.  She has anxiety that sometimes affects her daily functioning, with symptoms exacerbated by her pain levels. She takes Cymbalta  90 mg daily, which helps to some extent.  She has a history of trigeminal neuralgia and underwent halo radiation treatment, which she found distressing.  She experiences chronic constipation, worsened by her use of Percocet for pain management. She has been taking Dulcolax nightly for a week without improvement and has a history of a sluggish digestive system.  She reports a fall on October 04, 2024, while under the influence of Ativan, but did not require emergency care.  She has some memory issues, noted by her children, but they do not  significantly impact her daily functioning. She manages her own finances and uses her phone independently.     Pregnancy Intention Screening:  The pregnancy intention screening data noted above was reviewed. Potential methods of contraception were discussed. The patient elected to proceed with No data recorded.  Depression Screening;    11/15/2024   11:24 AM 09/20/2024   11:29 AM 08/31/2024    3:12 PM 08/23/2024    2:53 PM 06/11/2024    3:16 PM 04/06/2024   10:11 AM 02/27/2024    2:19 PM  PHQ 2/9 Scores  PHQ - 2 Score 0 2 2 1 2  0 0  PHQ- 9 Score  11 9         Anxiety Screening:    09/20/2024   11:30 AM  GAD 7 : Generalized Anxiety Score  Nervous, Anxious, on Edge 0   Control/stop worrying 1   Worry too much - different things 1   Trouble relaxing 1   Restless 1   Easily annoyed or irritable 1   Afraid - awful might happen 1   Total GAD 7 Score 6     Data saved with a previous flowsheet row definition     ROS: Negative unless specifically indicated above in HPI.       Objective:     BP 120/80 (BP Location: Left Arm, Patient Position: Sitting, Cuff Size: Normal)   Pulse 74   Temp 98.1 F (36.7 C) (Oral)  Ht 5' 7 (1.702 m)   Wt 169 lb (76.7 kg)   SpO2 98%   BMI 26.47 kg/m    Physical Exam GENERAL: Alert, cooperative, well developed, no acute distress. HEAD: Normocephalic atraumatic. EYES: Extraocular movements intact bilaterally, pupils round, equal and reactive to light bilaterally, conjunctivae normal bilaterally. EARS: Tympanic membrane, ear canal and external ear normal bilaterally. NOSE: No congestion or rhinorrhea, mucous membranes are moist. THROAT: No oropharyngeal exudate or posterior oropharyngeal erythema. CARDIOVASCULAR: Normal heart rate and rhythm, S1 and S2 normal without murmurs. CHEST: Clear to auscultation bilaterally, no wheezes, rhonchi, or crackles. ABDOMEN: Soft, non-tender, mildly distended, without organomegaly, normal bowel  sounds. EXTREMITIES: No cyanosis or edema. NEUROLOGICAL: Oriented to person, place and time, no gait abnormalities, moves all extremities without gross motor or sensory deficit, cranial nerves grossly intact. NECK: Neck supple, no tenderness.        Thatcher Doberstein K Sharni Negron, MD  11/15/24    Contains text generated by [employer].    "

## 2024-11-15 NOTE — Patient Instructions (Addendum)
 Thank you for visiting [facility] today! Here's what we talked about: - Get Tentanus and shingles vaccine from pharmacy - Start taking 2 tablets of Trazodone , if no improvements after 5 days, start taking 3.  - Start taking two of the 30mg  of Cymbalta , combine with one of the 60mg  for total 120mg  - Pick up Senna 2 tablets nightly from pharmacy

## 2024-11-16 LAB — HIV ANTIBODY (ROUTINE TESTING W REFLEX)
HIV 1&2 Ab, 4th Generation: NONREACTIVE
HIV FINAL INTERPRETATION: NEGATIVE

## 2024-11-16 LAB — HEPATITIS C ANTIBODY: Hepatitis C Ab: NONREACTIVE

## 2024-11-18 ENCOUNTER — Ambulatory Visit: Payer: Self-pay

## 2024-11-23 NOTE — Progress Notes (Unsigned)
 "  Subjective:    Patient ID: [name], female    DOB: [birth year], 59 y.o.   MRN: [id number]  HPI   Pain Inventory Average Pain {NUMBERS; 0-10:5044} Pain Right Now {NUMBERS; 0-10:5044} My pain is {PAIN DESCRIPTION:21022940}  In the last 24 hours, has pain interfered with the following? General activity {NUMBERS; 0-10:5044} Relation with others {NUMBERS; 0-10:5044} Enjoyment of life {NUMBERS; 0-10:5044} What TIME of day is your pain at its worst? {time of day:24191} Sleep (in general) {BHH GOOD/FAIR/POOR:22877}  Pain is worse with: {ACTIVITIES:21022942} Pain improves with: {PAIN IMPROVES TPUY:78977056} Relief from Meds: {NUMBERS; 0-10:5044}  Family History  Problem Relation Age of Onset   Hypertension Mother    Lung cancer Mother    Colon cancer Father    Hypertension Sister    Cancer Sister    Hyperlipidemia Sister    Breast cancer Sister 38   Breast cancer Paternal Aunt    Birth defects Maternal Grandmother    Breast cancer Maternal Grandmother    Ovarian cancer Maternal Grandmother    Birth defects Paternal Grandmother        uterine, stomach, lung   Breast cancer Paternal Grandmother    Heart attack Paternal Grandfather    Heart failure Paternal Grandfather    Cancer Brother    Social History   Socioeconomic History   Marital status: Married    Spouse name: Not on file   Number of children: 2   Years of education: Not on file   Highest education level: Not on file  Occupational History   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  Tobacco Use   Smoking status: Never    Passive exposure: Current   Smokeless tobacco: Never  Vaping Use   Vaping status: Never Used  Substance and Sexual Activity   Alcohol  use: Not Currently    Comment: 01/24/2013 drink or 2 once or /twice/year, if that   Drug use: No   Sexual activity: Yes    Birth control/protection: None  Other Topics Concern   Not on file  Social History Narrative   No living will   Would want  husband as POA--then sister, Barnie   Would accept resuscitation attempts   Would not want tube feeds/long term ventilation if cognitively unaware   Pt lives with family    Pt doesn't work    Social Drivers of Health   Tobacco Use: Medium Risk (11/15/2024)   Patient History    Smoking Tobacco Use: Never    Smokeless Tobacco Use: Never    Passive Exposure: Current  Financial Resource Strain: Not on file  Food Insecurity: Not on file  Transportation Needs: Not on file  Physical Activity: Not on file  Stress: Not on file  Social Connections: Not on file  Depression (PHQ2-9): Low Risk (11/15/2024)   Depression (PHQ2-9)    PHQ-2 Score: 0  Recent Concern: Depression (PHQ2-9) - High Risk (09/20/2024)   Depression (PHQ2-9)    PHQ-2 Score: 11  Alcohol  Screen: Not on file  Housing: Not on file  Utilities: Not on file  Health Literacy: Not on file   Past Surgical History:  Procedure Laterality Date   APPENDECTOMY  ~ 06/2007   BLADDER REPAIR  ~ 06/2007   same day after bladder lift (01/24/2013)   BLADDER SUSPENSION  ~ 06/2007   BREAST BIOPSY Left    DIAGNOSTIC LAPAROSCOPY  1990's & ~ 2000   I've had a couple; for endometrosis (01/24/2013)   HERNIA REPAIR  INCISIONAL HERNIA REPAIR N/A 01/24/2013   Procedure: LAPAROSCOPIC INCISIONAL HERNIA;  Surgeon: Vicenta DELENA Poli, MD;  [facility]: [facility];  Service: General;  Laterality: N/A;   INSERTION OF MESH N/A 01/24/2013   Procedure: INSERTION OF MESH;  Surgeon: Vicenta DELENA Poli, MD;  Location: [facility];  Service: General;  Laterality: N/A;   LAPAROSCOPIC INCISIONAL / UMBILICAL / VENTRAL HERNIA REPAIR  01/24/2013   IHR w/mesh/notes   NASAL SEPTUM SURGERY  1980's?   OOPHORECTOMY Right 2009   TONSILLECTOMY  1990's   VAGINAL HYSTERECTOMY  ` 06/2007   Past Surgical History:  Procedure Laterality Date   APPENDECTOMY  ~ 06/2007   BLADDER REPAIR  ~ 06/2007   same day after bladder lift (01/24/2013)   BLADDER SUSPENSION  ~ 06/2007   BREAST BIOPSY Left     DIAGNOSTIC LAPAROSCOPY  1990's & ~ 2000   I've had a couple; for endometrosis (01/24/2013)   HERNIA REPAIR     INCISIONAL HERNIA REPAIR N/A 01/24/2013   Procedure: LAPAROSCOPIC INCISIONAL HERNIA;  Surgeon: Vicenta DELENA Poli, MD;  [facility]: [facility];  Service: General;  Laterality: N/A;   INSERTION OF MESH N/A 01/24/2013   Procedure: INSERTION OF MESH;  Surgeon: Vicenta DELENA Poli, MD;  Location: [facility];  Service: General;  Laterality: N/A;   LAPAROSCOPIC INCISIONAL / UMBILICAL / VENTRAL HERNIA REPAIR  01/24/2013   IHR w/mesh/notes   NASAL SEPTUM SURGERY  1980's?   OOPHORECTOMY Right 2009   TONSILLECTOMY  1990's   VAGINAL HYSTERECTOMY  ` 06/2007   Past Medical History:  Diagnosis Date   Allergy    Anxiety    Arthritis    just the norm (01/24/2013)   ASCVD (arteriosclerotic cardiovascular disease)    Asthma    Celiac disease    Daily headache    depends on the season (01/24/2013)   Diabetes mellitus without complication (HCC)    Fibromyalgia    GERD (gastroesophageal reflux disease)    History of colonic polyps    Hyperlipidemia    Hypertension    Migraines    Obesity (BMI 30.0-34.9)    RSD (reflex sympathetic dystrophy)    Sleep apnea    NO MACHINE RECOMMENDED   TIA (transient ischemic attack) ~ 2009   There were no vitals taken for this visit.  Opioid Risk Score:   Fall Risk Score:  `1  Depression screen [facility] 2/9     11/15/2024   11:24 AM 09/20/2024   11:29 AM 08/31/2024    3:12 PM 08/23/2024    2:53 PM 06/11/2024    3:16 PM 04/06/2024   10:11 AM 02/27/2024    2:19 PM  Depression screen PHQ 2/9  Decreased Interest 0 1 1 1 1  0 0  Down, Depressed, Hopeless 0 1 1 0 1 0 0  PHQ - 2 Score 0 2 2 1 2  0 0  Altered sleeping  3 1      Tired, decreased energy  3 2      Change in appetite  1 1      Feeling bad or failure about yourself   1 1      Trouble concentrating  1 1      Moving slowly or fidgety/restless  0 1      Suicidal thoughts  0 0      PHQ-9 Score  11 9       Difficult doing work/chores  Somewhat difficult Somewhat difficult        Review of  Systems     Objective:   Physical Exam        Assessment & Plan:    "

## 2024-11-26 ENCOUNTER — Encounter: Admitting: Registered Nurse

## 2024-12-03 ENCOUNTER — Ambulatory Visit

## 2025-01-16 ENCOUNTER — Ambulatory Visit

## 2025-11-11 ENCOUNTER — Other Ambulatory Visit

## 2025-11-18 ENCOUNTER — Encounter
